# Patient Record
Sex: Female | Born: 1950 | Race: White | Hispanic: No | Marital: Married | Smoking: Former smoker
Health system: Southern US, Community
[De-identification: ages and names within clinical notes are randomized; demographics above are authoritative.]

## PROBLEM LIST (undated history)

## (undated) DIAGNOSIS — M549 Dorsalgia, unspecified: Secondary | ICD-10-CM

## (undated) DIAGNOSIS — R7981 Abnormal blood-gas level: Secondary | ICD-10-CM

## (undated) DIAGNOSIS — E039 Hypothyroidism, unspecified: Secondary | ICD-10-CM

## (undated) DIAGNOSIS — R600 Localized edema: Secondary | ICD-10-CM

## (undated) DIAGNOSIS — K1370 Unspecified lesions of oral mucosa: Secondary | ICD-10-CM

## (undated) DIAGNOSIS — T7840XA Allergy, unspecified, initial encounter: Secondary | ICD-10-CM

## (undated) DIAGNOSIS — M25569 Pain in unspecified knee: Secondary | ICD-10-CM

## (undated) DIAGNOSIS — E662 Morbid (severe) obesity with alveolar hypoventilation: Secondary | ICD-10-CM

## (undated) DIAGNOSIS — M545 Low back pain, unspecified: Secondary | ICD-10-CM

## (undated) DIAGNOSIS — F329 Major depressive disorder, single episode, unspecified: Secondary | ICD-10-CM

## (undated) DIAGNOSIS — I10 Essential (primary) hypertension: Secondary | ICD-10-CM

## (undated) DIAGNOSIS — J449 Chronic obstructive pulmonary disease, unspecified: Secondary | ICD-10-CM

## (undated) DIAGNOSIS — R0683 Snoring: Secondary | ICD-10-CM

## (undated) DIAGNOSIS — F32A Depression, unspecified: Secondary | ICD-10-CM

## (undated) DIAGNOSIS — M542 Cervicalgia: Secondary | ICD-10-CM

## (undated) DIAGNOSIS — R0602 Shortness of breath: Secondary | ICD-10-CM

## (undated) DIAGNOSIS — M7989 Other specified soft tissue disorders: Secondary | ICD-10-CM

## (undated) DIAGNOSIS — R053 Chronic cough: Secondary | ICD-10-CM

## (undated) DIAGNOSIS — R05 Cough: Secondary | ICD-10-CM

## (undated) DIAGNOSIS — K219 Gastro-esophageal reflux disease without esophagitis: Secondary | ICD-10-CM

## (undated) DIAGNOSIS — R011 Cardiac murmur, unspecified: Secondary | ICD-10-CM

## (undated) DIAGNOSIS — M25519 Pain in unspecified shoulder: Secondary | ICD-10-CM

## (undated) DIAGNOSIS — I509 Heart failure, unspecified: Secondary | ICD-10-CM

## (undated) DIAGNOSIS — D649 Anemia, unspecified: Secondary | ICD-10-CM

## (undated) DIAGNOSIS — E878 Other disorders of electrolyte and fluid balance, not elsewhere classified: Secondary | ICD-10-CM

## (undated) DIAGNOSIS — R42 Dizziness and giddiness: Secondary | ICD-10-CM

## (undated) HISTORY — DX: Chronic cough: R05.3

## (undated) HISTORY — DX: Morbid (severe) obesity with alveolar hypoventilation: E66.2

## (undated) HISTORY — DX: Dorsalgia, unspecified: M54.9

## (undated) HISTORY — DX: Depression, unspecified: F32.A

## (undated) HISTORY — DX: Dizziness and giddiness: R42

## (undated) HISTORY — DX: Heart failure, unspecified: I50.9

## (undated) HISTORY — DX: Pain in unspecified shoulder: M25.519

## (undated) HISTORY — PX: OTHER SURGICAL HISTORY: SHX169

## (undated) HISTORY — DX: Unspecified lesions of oral mucosa: K13.70

## (undated) HISTORY — PX: TONSILLECTOMY: SUR1361

## (undated) HISTORY — DX: Chronic obstructive pulmonary disease, unspecified: J44.9

## (undated) HISTORY — DX: Other disorders of electrolyte and fluid balance, not elsewhere classified: E87.8

## (undated) HISTORY — DX: Anemia, unspecified: D64.9

## (undated) HISTORY — DX: Cough: R05

## (undated) HISTORY — PX: CHOLECYSTECTOMY: SHX55

## (undated) HISTORY — DX: Other specified soft tissue disorders: M79.89

## (undated) HISTORY — DX: Pain in unspecified knee: M25.569

## (undated) HISTORY — PX: ECTOPIC PREGNANCY SURGERY: SHX613

## (undated) HISTORY — DX: Hypothyroidism, unspecified: E03.9

## (undated) HISTORY — DX: Morbid (severe) obesity due to excess calories: E66.01

## (undated) HISTORY — DX: Essential (primary) hypertension: I10

## (undated) HISTORY — PX: APPENDECTOMY: SHX54

## (undated) HISTORY — DX: Localized edema: R60.0

## (undated) HISTORY — DX: Low back pain: M54.5

## (undated) HISTORY — PX: TUBAL LIGATION: SHX77

## (undated) HISTORY — DX: Snoring: R06.83

## (undated) HISTORY — DX: Low back pain, unspecified: M54.50

## (undated) HISTORY — PX: MENISCUS REPAIR: SHX5179

## (undated) HISTORY — DX: Abnormal blood-gas level: R79.81

## (undated) HISTORY — DX: Allergy, unspecified, initial encounter: T78.40XA

## (undated) HISTORY — DX: Cervicalgia: M54.2

---
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
## 1997-09-07 ENCOUNTER — Emergency Department (HOSPITAL_COMMUNITY): Admission: EM | Admit: 1997-09-07 | Discharge: 1997-09-07 | Payer: Self-pay | Admitting: Emergency Medicine

## 1997-09-27 ENCOUNTER — Ambulatory Visit (HOSPITAL_COMMUNITY): Admission: RE | Admit: 1997-09-27 | Discharge: 1997-09-28 | Payer: Self-pay | Admitting: *Deleted

## 1997-11-02 ENCOUNTER — Encounter: Admission: RE | Admit: 1997-11-02 | Discharge: 1997-11-02 | Payer: Self-pay | Admitting: Family Medicine

## 1998-01-19 ENCOUNTER — Encounter: Admission: RE | Admit: 1998-01-19 | Discharge: 1998-01-19 | Payer: Self-pay | Admitting: Family Medicine

## 1998-02-17 ENCOUNTER — Encounter: Admission: RE | Admit: 1998-02-17 | Discharge: 1998-02-17 | Payer: Self-pay | Admitting: Family Medicine

## 1998-03-16 ENCOUNTER — Encounter: Admission: RE | Admit: 1998-03-16 | Discharge: 1998-03-16 | Payer: Self-pay | Admitting: Sports Medicine

## 1999-03-02 ENCOUNTER — Emergency Department (HOSPITAL_COMMUNITY): Admission: EM | Admit: 1999-03-02 | Discharge: 1999-03-02 | Payer: Self-pay | Admitting: Emergency Medicine

## 1999-03-02 ENCOUNTER — Encounter: Payer: Self-pay | Admitting: Emergency Medicine

## 2003-04-06 ENCOUNTER — Other Ambulatory Visit: Admission: RE | Admit: 2003-04-06 | Discharge: 2003-04-06 | Payer: Self-pay | Admitting: Family Medicine

## 2003-07-07 ENCOUNTER — Ambulatory Visit (HOSPITAL_COMMUNITY): Admission: RE | Admit: 2003-07-07 | Discharge: 2003-07-07 | Payer: Self-pay | Admitting: Family Medicine

## 2003-07-13 ENCOUNTER — Encounter (INDEPENDENT_AMBULATORY_CARE_PROVIDER_SITE_OTHER): Payer: Self-pay | Admitting: *Deleted

## 2003-07-13 ENCOUNTER — Ambulatory Visit (HOSPITAL_COMMUNITY): Admission: RE | Admit: 2003-07-13 | Discharge: 2003-07-13 | Payer: Self-pay | Admitting: Gastroenterology

## 2003-07-27 ENCOUNTER — Emergency Department (HOSPITAL_COMMUNITY): Admission: EM | Admit: 2003-07-27 | Discharge: 2003-07-27 | Payer: Self-pay | Admitting: Family Medicine

## 2004-08-24 ENCOUNTER — Encounter: Admission: RE | Admit: 2004-08-24 | Discharge: 2004-08-24 | Payer: Self-pay | Admitting: Family Medicine

## 2005-10-31 ENCOUNTER — Ambulatory Visit: Payer: Self-pay | Admitting: Family Medicine

## 2005-10-31 ENCOUNTER — Other Ambulatory Visit: Admission: RE | Admit: 2005-10-31 | Discharge: 2005-10-31 | Payer: Self-pay | Admitting: Family Medicine

## 2005-11-06 ENCOUNTER — Encounter: Admission: RE | Admit: 2005-11-06 | Discharge: 2005-11-06 | Payer: Self-pay | Admitting: Family Medicine

## 2006-04-14 ENCOUNTER — Ambulatory Visit (HOSPITAL_BASED_OUTPATIENT_CLINIC_OR_DEPARTMENT_OTHER): Admission: RE | Admit: 2006-04-14 | Discharge: 2006-04-14 | Payer: Self-pay | Admitting: Orthopedic Surgery

## 2006-07-12 ENCOUNTER — Emergency Department (HOSPITAL_COMMUNITY): Admission: EM | Admit: 2006-07-12 | Discharge: 2006-07-12 | Payer: Self-pay | Admitting: Family Medicine

## 2006-07-15 ENCOUNTER — Ambulatory Visit: Payer: Self-pay | Admitting: Family Medicine

## 2006-10-31 ENCOUNTER — Ambulatory Visit: Payer: Self-pay | Admitting: Family Medicine

## 2006-12-01 ENCOUNTER — Ambulatory Visit: Payer: Self-pay | Admitting: Family Medicine

## 2006-12-17 ENCOUNTER — Ambulatory Visit: Payer: Self-pay | Admitting: Family Medicine

## 2007-01-15 ENCOUNTER — Ambulatory Visit: Payer: Self-pay | Admitting: Family Medicine

## 2007-02-11 ENCOUNTER — Ambulatory Visit (HOSPITAL_BASED_OUTPATIENT_CLINIC_OR_DEPARTMENT_OTHER): Admission: RE | Admit: 2007-02-11 | Discharge: 2007-02-11 | Payer: Self-pay | Admitting: Family Medicine

## 2007-02-19 ENCOUNTER — Ambulatory Visit: Payer: Self-pay | Admitting: Pulmonary Disease

## 2007-02-27 ENCOUNTER — Ambulatory Visit: Payer: Self-pay | Admitting: Family Medicine

## 2007-04-01 ENCOUNTER — Emergency Department (HOSPITAL_COMMUNITY): Admission: EM | Admit: 2007-04-01 | Discharge: 2007-04-01 | Payer: Self-pay | Admitting: Emergency Medicine

## 2007-10-29 ENCOUNTER — Encounter: Admission: RE | Admit: 2007-10-29 | Discharge: 2007-10-29 | Payer: Self-pay | Admitting: Orthopedic Surgery

## 2008-02-10 ENCOUNTER — Encounter: Admission: RE | Admit: 2008-02-10 | Discharge: 2008-02-10 | Payer: Self-pay | Admitting: Internal Medicine

## 2008-03-23 ENCOUNTER — Ambulatory Visit (HOSPITAL_COMMUNITY): Admission: RE | Admit: 2008-03-23 | Discharge: 2008-03-23 | Payer: Self-pay | Admitting: Otolaryngology

## 2008-03-23 ENCOUNTER — Encounter (INDEPENDENT_AMBULATORY_CARE_PROVIDER_SITE_OTHER): Payer: Self-pay | Admitting: Otolaryngology

## 2009-02-15 ENCOUNTER — Ambulatory Visit (HOSPITAL_COMMUNITY): Admission: RE | Admit: 2009-02-15 | Discharge: 2009-02-15 | Payer: Self-pay | Admitting: Sports Medicine

## 2009-02-15 ENCOUNTER — Ambulatory Visit: Payer: Self-pay | Admitting: Vascular Surgery

## 2009-02-15 ENCOUNTER — Encounter (INDEPENDENT_AMBULATORY_CARE_PROVIDER_SITE_OTHER): Payer: Self-pay | Admitting: Sports Medicine

## 2009-10-01 ENCOUNTER — Emergency Department (HOSPITAL_COMMUNITY): Admission: EM | Admit: 2009-10-01 | Discharge: 2009-10-01 | Payer: Self-pay | Admitting: Emergency Medicine

## 2010-05-17 ENCOUNTER — Emergency Department (HOSPITAL_COMMUNITY): Payer: Self-pay

## 2010-05-17 ENCOUNTER — Emergency Department (HOSPITAL_COMMUNITY)
Admission: EM | Admit: 2010-05-17 | Discharge: 2010-05-17 | Disposition: A | Discharge status: Home / Self Care | Payer: Self-pay | Attending: Emergency Medicine | Admitting: Emergency Medicine

## 2010-05-17 DIAGNOSIS — R112 Nausea with vomiting, unspecified: Secondary | ICD-10-CM | POA: Insufficient documentation

## 2010-05-17 DIAGNOSIS — E669 Obesity, unspecified: Secondary | ICD-10-CM | POA: Insufficient documentation

## 2010-05-17 DIAGNOSIS — I10 Essential (primary) hypertension: Secondary | ICD-10-CM | POA: Insufficient documentation

## 2010-05-17 DIAGNOSIS — R51 Headache: Secondary | ICD-10-CM | POA: Insufficient documentation

## 2010-05-17 DIAGNOSIS — J4489 Other specified chronic obstructive pulmonary disease: Secondary | ICD-10-CM | POA: Insufficient documentation

## 2010-05-17 DIAGNOSIS — J449 Chronic obstructive pulmonary disease, unspecified: Secondary | ICD-10-CM | POA: Insufficient documentation

## 2010-05-17 DIAGNOSIS — M1990 Unspecified osteoarthritis, unspecified site: Secondary | ICD-10-CM | POA: Insufficient documentation

## 2010-05-17 LAB — DIFFERENTIAL
Basophils Absolute: 0.1 10*3/uL (ref 0.0–0.1)
Basophils Relative: 1 % (ref 0–1)
Eosinophils Absolute: 0.2 10*3/uL (ref 0.0–0.7)
Eosinophils Relative: 2 % (ref 0–5)
Lymphocytes Relative: 28 % (ref 12–46)
Lymphs Abs: 2.8 10*3/uL (ref 0.7–4.0)
Monocytes Absolute: 0.8 10*3/uL (ref 0.1–1.0)
Monocytes Relative: 8 % (ref 3–12)
Neutro Abs: 6.4 10*3/uL (ref 1.7–7.7)
Neutrophils Relative %: 63 % (ref 43–77)

## 2010-05-17 LAB — CBC
HCT: 46.7 % — ABNORMAL HIGH (ref 36.0–46.0)
Hemoglobin: 15.3 g/dL — ABNORMAL HIGH (ref 12.0–15.0)
MCH: 28.6 pg (ref 26.0–34.0)
MCHC: 32.8 g/dL (ref 30.0–36.0)
MCV: 87.3 fL (ref 78.0–100.0)
Platelets: 228 10*3/uL (ref 150–400)
RBC: 5.35 MIL/uL — ABNORMAL HIGH (ref 3.87–5.11)
RDW: 14.7 % (ref 11.5–15.5)
WBC: 10.3 10*3/uL (ref 4.0–10.5)

## 2010-05-17 LAB — POCT I-STAT, CHEM 8
BUN: 13 mg/dL (ref 6–23)
Calcium, Ion: 1.1 mmol/L — ABNORMAL LOW (ref 1.12–1.32)
Chloride: 101 mEq/L (ref 96–112)
Creatinine, Ser: 0.9 mg/dL (ref 0.4–1.2)
Glucose, Bld: 99 mg/dL (ref 70–99)
HCT: 48 % — ABNORMAL HIGH (ref 36.0–46.0)
Hemoglobin: 16.3 g/dL — ABNORMAL HIGH (ref 12.0–15.0)
Potassium: 4.2 mEq/L (ref 3.5–5.1)
Sodium: 138 mEq/L (ref 135–145)
TCO2: 29 mmol/L (ref 0–100)

## 2010-06-25 LAB — BASIC METABOLIC PANEL
BUN: 13 mg/dL (ref 6–23)
CO2: 24 mEq/L (ref 19–32)
Calcium: 9.2 mg/dL (ref 8.4–10.5)
Chloride: 108 mEq/L (ref 96–112)
Creatinine, Ser: 0.63 mg/dL (ref 0.4–1.2)
GFR calc Af Amer: >60 mL/min (ref >60)
GFR calc non Af Amer: >60 mL/min (ref >60)
Glucose, Bld: 88 mg/dL (ref 70–99)
Potassium: 4 mEq/L (ref 3.5–5.1)
Sodium: 141 mEq/L (ref 135–145)

## 2010-06-25 LAB — CBC
HCT: 45.4 % (ref 36.0–46.0)
Hemoglobin: 15.3 g/dL — ABNORMAL HIGH (ref 12.0–15.0)
MCHC: 33.7 g/dL (ref 30.0–36.0)
MCV: 87.1 fL (ref 78.0–100.0)
Platelets: 188 10*3/uL (ref 150–400)
RBC: 5.21 MIL/uL — ABNORMAL HIGH (ref 3.87–5.11)
RDW: 14.2 % (ref 11.5–15.5)
WBC: 10.4 10*3/uL (ref 4.0–10.5)

## 2010-07-24 NOTE — Procedures (Signed)
NAMECHRISANN, MELARAGNO NO.:  [id number]   MEDICAL RECORD NO.:  [id number]          PATIENT TYPE:  OUT   LOCATION:  SLEEP CENTER                 FACILITY:  [facility]   PHYSICIAN:  Barbaraann Share, MD,FCCPDATE OF BIRTH:  February 01, 1953   DATE OF STUDY:  02/11/2007                            NOCTURNAL POLYSOMNOGRAM   REFERRING PHYSICIAN:  Sharlot Gowda, M.D.   INDICATION FOR STUDY:  Hypersomnia with sleep apnea.   EPWORTH SLEEPINESS SCORE:  11   MEDICATIONS:   SLEEP ARCHITECTURE:  The patient had a total sleep time of 320 minutes  with decreased slow wave sleep as well as REM.  Sleep onset latency is  prolonged at 58 minutes, and REM onset is normal.  Sleep efficiency was  decreased at 81%.   RESPIRATORY DATA:  The patient was found to have 23 obstructive  hypopneas and no apneas for an apnea/hypopnea index of 4 events per  hour.  The events occurred primarily during REM, and the REM  apnea/hypopnea index was 26 per hour.  Loud snoring was noted  throughout.   OXYGEN DATA:  There was O2 desaturation as low as 84% with the patient's  obstructive events.  These were very transitory in nature, and the  patient only spent 2 minutes the entire study less than 88%.   CARDIAC DATA:  No clinically significant cardiac arrhythmias were noted.   MOVEMENT-PARASOMNIA:  None.   IMPRESSIONS-RECOMMENDATIONS:  Small numbers of obstructive events which  do not meet the apnea/hypopnea index criteria for the obstructive sleep  apnea syndrome.  However, the patient had a very elevated REM  apnea/hypopnea index up to 26 events per hour.  Events that occur  primarily during REM can have a much greater impact than those occurring  in non-REM.  Consideration should be given to treating the patient's  sleep  disordered breathing if this is significantly impacting her quality of  life.  She should also be encouraged to work aggressively on her weight  loss.      Barbaraann Share,  MD,FCCP  Diplomate, American Board of Sleep  Medicine  Electronically Signed     KMC/MEDQ  D:  02/19/2007 16:56:54  T:  02/20/2007 11:44:37  Job:  [id number]

## 2010-07-24 NOTE — Op Note (Signed)
Morgan Odonnell, Morgan Odonnell            ACCOUNT NO.:  [id number]   MEDICAL RECORD NO.:  [id number]          PATIENT TYPE:  AMB   LOCATION:  SDS                          FACILITY:  [facility]   PHYSICIAN:  Antony Contras, MD     DATE OF BIRTH:  February 28, 1951   DATE OF PROCEDURE:  03/23/2008  DATE OF DISCHARGE:  03/23/2008                               OPERATIVE REPORT   PREOPERATIVE DIAGNOSES:  Tongue-base mass.   POSTOPERATIVE DIAGNOSIS:  Tongue-base mass.   PROCEDURE:  1. Direct laryngoscopy with biopsy.  2. Esophagoscopy.   SURGEON:  Excell Seltzer. Jenne Pane, MD   ANESTHESIA:  General endotracheal anesthesia.   COMPLICATIONS:  None.   INDICATIONS:  The patient is a 60 year old white female who was recently  evaluated due to some swallowing difficulty including an upper GI  radiologic study.  The study suggested an abnormality at the right  tongue base/vallecular region.  She is asymptomatic, but on fiberoptic  examination did have a red area on the right tongue base.  She presents  to the operating room for biopsy.   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  The right  tongue base did not appear grossly abnormal.  The biopsies were taken  from the right side as well as centrally.  The biopsy was also taken  from the right vallecular cyst.  The remainder of the pharynx and larynx  appeared normal as did the upper esophagus.   DESCRIPTION OF PROCEDURE:  The patient was identified in the holding  room and informed consent having been obtained including discussion of  risks, benefits, and alternatives, the patient was brought to the  operative suite and put on the operative table in supine position.  Anesthesia was induced.  The patient was intubated by the anesthesia  team without difficulty.  The patient was given intravenous steroids  during the case.  The eyes was taped closed and bed was turned 90  degrees from anesthesia.  A head wrap was placed around the patient's  head.  A damp gauze was placed over the upper gum and an anterior  commissure laryngoscope was then inserted and used to view the various  areas of the pharynx and larynx including the vallecula, tongue base,  endolarynx, piriform sinuses, and postcricoid region.  Findings noted  above.  The laryngoscope was then removed and a cervical esophagoscope  was then inserted and it was used to pass down the cervical esophagus  keeping the lumen in view.  It was then carefully backed out examining  the upper esophagus.  Findings are noted above.  The anterior commissure  laryngoscope was reinserted and used to examine the tongue base  carefully.  Biopsies were taken from the right vallecula, right tongue  base, and central tongue base using cup forceps.  After this was  completed, the throat was suctioned and laryngoscope and gauze removed.  The patient was then returned to Anesthesia for wake up and was  extubated and returned to recovery room in stable condition.  Antony Contras, MD  Electronically Signed     DDB/MEDQ  D:  03/23/2008  T:  03/24/2008  Job:  [id number]

## 2010-07-27 NOTE — Op Note (Signed)
NAME:  ALAZE, GARVERICK                      ACCOUNT NO.:  [id number]   MEDICAL RECORD NO.:  [id number]                   PATIENT TYPE:  AMB   LOCATION:  ENDO                                 FACILITY:  [facility]   PHYSICIAN:  Anselmo Rod, M.D.               DATE OF BIRTH:  1950-03-15   DATE OF PROCEDURE:  07/13/2003  DATE OF DISCHARGE:                                 OPERATIVE REPORT   PROCEDURE PERFORMED:  Colonoscopy with snare polypectomy times one and cold  biopsies times six.   ENDOSCOPIST:  Charna Elizabeth, M.D.   INSTRUMENT USED:  Olympus video colonoscope.   INDICATIONS FOR PROCEDURE:  The patient is a 60 year old female with history  of bright red blood per rectum and family history of colon cancer undergoing  screening colonoscopy.  Rule out colonic polyps, masses, hemorrhoids, etc.   PREPROCEDURE PREPARATION:  Informed consent was procured from the patient.  The patient was fasted for eight hours prior to the procedure and prepped  with a bottle of magnesium citrate and a gallon of GoLYTELY the night prior  to the procedure.   PREPROCEDURE PHYSICAL:  The patient had stable vital signs.  Neck supple.  Chest clear to auscultation.  S1 and S2 regular.  Abdomen soft with normal  bowel sounds.   DESCRIPTION OF PROCEDURE:  The patient was placed in left lateral decubitus  position and sedated with 70 mg of Demerol and 9 mg of Versed intravenously  in slow incremental doses.  Once the patient was adequately sedated and  maintained on low flow oxygen and continuous cardiac monitoring, the Olympus  video colonoscope was advanced from the rectum to the cecum.  There was some  residual stool in the colon and multiple washes were done.  The appendicular  orifice and ileocecal valve were clearly visualized and photographed. Two  small sessile polyps were biopsied from the left colon.  Another one was  snared from the left colon at 40 cm.  There was evidence of scattered  sigmoid  diverticulosis.  Small internal hemorrhoids were seen on  retroflexion in the rectum.   IMPRESSION:  1. Small nonbleeding internal hemorrhoids.  2. Sigmoid diverticulosis.  3. Three polyps removed from left colon, two by cold biopsy times six and     one by snare polypectomy.   RECOMMENDATIONS:  1. Await pathology results.  2. Avoid all nonsteroidals including aspirin for the next four weeks.  3. Outpatient followup in the next two weeks for further recommendations.                                               Anselmo Rod, M.D.    JNM/MEDQ  D:  07/13/2003  T:  07/13/2003  Job:  [id number]   cc:   Sharlot Gowda, M.D.  [address]  [place], [place] [address]  Fax: [phone]

## 2010-07-27 NOTE — Op Note (Signed)
[name], [name]            ACCOUNT NO.:  [id number]   MEDICAL RECORD NO.:  [id number]          PATIENT TYPE:  AMB   LOCATION:  DSC                          FACILITY:  [facility]   PHYSICIAN:  Harvie Junior, M.D.   DATE OF BIRTH:  08-Sep-1950   DATE OF PROCEDURE:  04/14/2006  DATE OF DISCHARGE:                               OPERATIVE REPORT   PREOPERATIVE DIAGNOSIS:  Medial meniscal tear.   POSTOPERATIVE DIAGNOSIS:  1. Medial meniscal tear with corresponding chondromalacia in the      medial compartment.  2. 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  3. Medial shelf plica.   PRINCIPAL PROCEDURE:  1. Arthroscopic partial posterior compartment medial meniscectomy with      corresponding debridement in the medial compartment.  2. Debridement of chondromalacia in the patellofemoral joint, both on      the lateral patellar facet and central trochlea.  3. Debridement of medial shelf plica.   SURGEON:  Harvie Junior, M.D.   ASSISTANT:  Marshia Ly, P.A.   ANESTHESIA:  General.   BRIEF HISTORY:  Morgan Odonnell is a 60 year old female with a long history  of having had a twisting injury to her knee.  She ultimately suffered a  posterior compartment medial meniscal tear.  We treated her  conservatively for a long period of time with antiinflammatory  medication, activity modification, injection therapy.  All this failed.  The patient continues to complain of pain on the medial side.  She is  ultimately taken to the operating room for operative medial arthroscopy  procedure.   PROCEDURE:  The patient was taken to the operating room.  After adequate  anesthesia was obtained using general anesthetic, the patient was placed  on the operating table.  The right leg was prepped and draped in the  usual sterile fashion.  Following this routine arthroscopic examination  of the knee revealed there was an obvious posterior horn medial  meniscal  tear.  This was at the medial meniscal root and curved around  posteriorly to the mid-body of the medial meniscus.  This area was  identified and debrided back to a smooth, stable rim.  There was some  grade 2 and grade 3 changes on the medial femoral condyle and these were  debrided back to a smooth and stable rim as well.   Attention was turned up to the patellofemoral joint where the lateral  patellar facet had some grade 2 changes.  The central trochlea had some  grade 2 changes.  The lateral compartment had no significant chondral  injury and the meniscus looked normal.  ACL looked normal.  There was a  large medial shelf plica that was blocking entrance into the medial  compartment, which was debrided and also had some fraying on the medial  femoral condyle where this was blocking entrance into the medial  compartment.   At this point the knee was copiously and thoroughly irrigated with 6 L  normal saline irrigation and suctioned dry.  The lower __________  was  closed with a  bandage.  A sterile compression dressing was applied.  The  patient was taken to recovery where she was noted to be in satisfactory  condition.   ESTIMATED BLOOD LOSS:  Estimated blood loss for the procedure was none.      Harvie Junior, M.D.  Electronically Signed     JLG/MEDQ  D:  04/14/2006  T:  04/15/2006  Job:  [id number]

## 2010-11-30 LAB — POCT URINALYSIS DIP (DEVICE)
Bilirubin Urine: NEGATIVE
Glucose, UA: NEGATIVE
Hgb urine dipstick: NEGATIVE
Ketones, ur: NEGATIVE
Nitrite: NEGATIVE
Operator id: 239701
Protein, ur: NEGATIVE
Specific Gravity, Urine: 1.015
Urobilinogen, UA: 0.2
pH: 7.5

## 2012-02-12 ENCOUNTER — Encounter (INDEPENDENT_AMBULATORY_CARE_PROVIDER_SITE_OTHER): Payer: Self-pay | Admitting: Surgery

## 2012-02-12 ENCOUNTER — Ambulatory Visit (INDEPENDENT_AMBULATORY_CARE_PROVIDER_SITE_OTHER): Payer: Medicaid Other | Admitting: Surgery

## 2012-02-12 VITALS — BP 130/72 | HR 86 | Temp 97.9°F | Ht 62.0 in | Wt 329.4 lb

## 2012-02-12 DIAGNOSIS — N61 Mastitis without abscess: Secondary | ICD-10-CM

## 2012-02-12 DIAGNOSIS — N611 Abscess of the breast and nipple: Secondary | ICD-10-CM | POA: Insufficient documentation

## 2012-02-12 NOTE — Progress Notes (Signed)
Subjective:     Patient ID: Morgan Odonnell, female   DOB: Dec 03, 1950, 61 y.o.   MRN: [id number]  HPI She is referred by Dr. Jarold Motto for evaluation of a right breast abscess. She started noticing a tender area on Thanksgiving. She just started antibiotics yesterday. She has had some spontaneous drainage. She has not had a mammogram in approximately 5 years per her report  Review of Systems     Objective:   Physical Exam On exam, there is a lateral right breast abscess. There is already an open wound. I prepped the area Betadine, anesthetized with lidocaine, and open up the wound further. I found an abscess cavity with no further purulence. I packed it with gauze    Assessment:     Right breast abscess    Plan:     Wound care instructions were given. She will continue the antibiotics. I will see her back in 2 weeks

## 2012-02-28 ENCOUNTER — Encounter (INDEPENDENT_AMBULATORY_CARE_PROVIDER_SITE_OTHER): Payer: Medicaid Other | Admitting: Surgery

## 2012-05-22 DIAGNOSIS — G4710 Hypersomnia, unspecified: Secondary | ICD-10-CM | POA: Diagnosis not present

## 2012-05-22 DIAGNOSIS — R0609 Other forms of dyspnea: Secondary | ICD-10-CM | POA: Diagnosis not present

## 2012-05-26 DIAGNOSIS — F331 Major depressive disorder, recurrent, moderate: Secondary | ICD-10-CM | POA: Diagnosis not present

## 2012-06-05 ENCOUNTER — Encounter: Payer: Self-pay | Admitting: Neurology

## 2012-06-05 ENCOUNTER — Telehealth: Payer: Self-pay | Admitting: Pulmonary Disease

## 2012-06-05 NOTE — Telephone Encounter (Signed)
Pt scheduled for Monday 06-08-12 in [facility] office. Malachi Bonds at Dr. Silvano Rusk office is aware. Carron Curie, CMA

## 2012-06-08 ENCOUNTER — Encounter: Payer: Self-pay | Admitting: Critical Care Medicine

## 2012-06-08 ENCOUNTER — Ambulatory Visit (HOSPITAL_BASED_OUTPATIENT_CLINIC_OR_DEPARTMENT_OTHER)
Admission: RE | Admit: 2012-06-08 | Discharge: 2012-06-08 | Disposition: A | Discharge status: Home / Self Care | Payer: Medicare Other | Source: Ambulatory Visit | Attending: Critical Care Medicine | Admitting: Critical Care Medicine

## 2012-06-08 ENCOUNTER — Ambulatory Visit (INDEPENDENT_AMBULATORY_CARE_PROVIDER_SITE_OTHER): Payer: Medicare Other | Admitting: Critical Care Medicine

## 2012-06-08 VITALS — BP 102/68 | HR 62 | Temp 97.7°F | Ht 62.0 in | Wt 326.5 lb

## 2012-06-08 DIAGNOSIS — J9610 Chronic respiratory failure, unspecified whether with hypoxia or hypercapnia: Secondary | ICD-10-CM

## 2012-06-08 DIAGNOSIS — M545 Low back pain, unspecified: Secondary | ICD-10-CM | POA: Insufficient documentation

## 2012-06-08 DIAGNOSIS — J9612 Chronic respiratory failure with hypercapnia: Secondary | ICD-10-CM

## 2012-06-08 DIAGNOSIS — R609 Edema, unspecified: Secondary | ICD-10-CM | POA: Diagnosis not present

## 2012-06-08 DIAGNOSIS — R600 Localized edema: Secondary | ICD-10-CM

## 2012-06-08 DIAGNOSIS — R05 Cough: Secondary | ICD-10-CM | POA: Insufficient documentation

## 2012-06-08 DIAGNOSIS — R0683 Snoring: Secondary | ICD-10-CM | POA: Insufficient documentation

## 2012-06-08 DIAGNOSIS — Z0189 Encounter for other specified special examinations: Secondary | ICD-10-CM | POA: Diagnosis not present

## 2012-06-08 DIAGNOSIS — I10 Essential (primary) hypertension: Secondary | ICD-10-CM | POA: Insufficient documentation

## 2012-06-08 DIAGNOSIS — J441 Chronic obstructive pulmonary disease with (acute) exacerbation: Secondary | ICD-10-CM | POA: Insufficient documentation

## 2012-06-08 DIAGNOSIS — J449 Chronic obstructive pulmonary disease, unspecified: Secondary | ICD-10-CM | POA: Diagnosis not present

## 2012-06-08 DIAGNOSIS — J9621 Acute and chronic respiratory failure with hypoxia: Secondary | ICD-10-CM | POA: Insufficient documentation

## 2012-06-08 LAB — POCT I-STAT 3, ART BLOOD GAS (G3+)
Acid-Base Excess: 2 mmol/L (ref 0.0–2.0)
Bicarbonate: 28.3 mEq/L — ABNORMAL HIGH (ref 20.0–24.0)
O2 Saturation: 95 % (ref 90.0–100.0)
Patient temperature: 98
TCO2: 30 mmol/L (ref 0–100)
pCO2 arterial: 49 mmHg — ABNORMAL HIGH (ref 35.0–45.0)
pH, Arterial: 7.367 (ref 7.350–7.400)
pO2, Arterial: 76 mmHg — ABNORMAL LOW (ref 80.0–100.0)

## 2012-06-08 NOTE — Patient Instructions (Signed)
Stay on symbicort and spiriva We will obtain ABG today  We will schedule a pulmonary function study and based upon this may obtain a nighttime bipap device with or with out oxygen Return 1 month at [facility] office but we will call sooner with results

## 2012-06-08 NOTE — Progress Notes (Signed)
Subjective:    Patient ID: Morgan Odonnell, female    DOB: 26-Sep-1950, 62 y.o.   MRN: [id number]  Shortness of Breath This is a chronic problem. The current episode started more than 1 year ago. The problem occurs constantly (unable to walk long distances). The problem has been gradually worsening. Associated symptoms include headaches, leg swelling, neck pain, PND, a rash, rhinorrhea, vomiting and wheezing. Pertinent negatives include no abdominal pain, chest pain, claudication, coryza, ear pain, fever, hemoptysis, leg pain, orthopnea or sore throat. The symptoms are aggravated by any activity, lying flat, exercise and weather changes. Risk factors include smoking (quit smoking one year ago). She has tried beta agonist inhalers and ipratropium inhalers for the symptoms. The treatment provided moderate relief. Her past medical history is significant for asthma, COPD and pneumonia. There is no history of allergies, aspirin allergies, bronchiolitis, CAD, chronic lung disease, DVT, a heart failure, PE or a recent surgery.   62 y.o.WF.  Pt just had sleep study.   Pt with hx of issues for 32yrs. Pt notes wheezing, dyspnea, fatigue.  No real cough.  No real chest pain.  Notes edema in legs.  Notes no heartburn or indigestion.  No real mucus. Notes PNA  X 8 . Goes to be 1030, gets up 7a.  Awakens drowsy and fatigued.   Past Medical History  Diagnosis Date  . Oral lesion   . Leg edema     chronic, bilateral  . Morbid obesity   . Chronic cough   . COPD (chronic obstructive pulmonary disease)   . Hypertension   . Low back pain   . Snoring      Family History  Problem Relation Age of Onset  . Cancer Mother     ovarian  . Cancer Brother     lymphomia  . Cancer Maternal Aunt     ovarian  . Lung disease Mother   . Asthma Daughter   . Heart failure Mother      History   Social History  . Marital Status: Married    Spouse Name: N/A    Number of Children: N/A  . Years of Education: N/A    Occupational History  . Not on file.   Social History Main Topics  . Smoking status: Former Smoker -- 1.50 packs/day for 45 years    Types: Cigarettes    Quit date: 02/12/2011  . Smokeless tobacco: Not on file  . Alcohol Use: No  . Drug Use: No  . Sexually Active: Not on file   Other Topics Concern  . Not on file   Social History Narrative  . No narrative on file     Allergies  Allergen Reactions  . Gabapentin     "slept for 2 days"     Outpatient Prescriptions Prior to Visit  Medication Sig Dispense Refill  . albuterol (PROVENTIL HFA;VENTOLIN HFA) 108 (90 BASE) MCG/ACT inhaler Inhale 2 puffs into the lungs every 6 (six) hours as needed.      . budesonide-formoterol (SYMBICORT) 160-4.5 MCG/ACT inhaler Inhale 2 puffs into the lungs 2 (two) times daily.      Marland Kitchen buPROPion (ZYBAN) 150 MG 12 hr tablet Take 150 mg by mouth daily.       . diclofenac sodium (VOLTAREN) 1 % GEL Apply topically.      Marland Kitchen HYDROcodone-acetaminophen (NORCO/VICODIN) 5-325 MG per tablet Take 1 tablet by mouth 3 (three) times daily.       Marland Kitchen loratadine (CLARITIN) 10 MG tablet  Take 10 mg by mouth daily.      . meloxicam (MOBIC) 15 MG tablet Take 15 mg by mouth daily.      Marland Kitchen METOPROLOL TARTRATE PO Take 25 mg by mouth 2 (two) times daily.      . MOMETASONE FUROATE EX Apply topically as needed.       . polyethylene glycol (MIRALAX / GLYCOLAX) packet Take 17 g by mouth daily.      Marland Kitchen tiotropium (SPIRIVA) 18 MCG inhalation capsule Place 18 mcg into inhaler and inhale daily.      . verapamil (CALAN) 120 MG tablet Take 120 mg by mouth 2 (two) times daily.      Marland Kitchen albuterol (PROVENTIL) (2.5 MG/3ML) 0.083% nebulizer solution Take 2.5 mg by nebulization as needed.      . doxycycline (DORYX) 100 MG EC tablet Take 100 mg by mouth 2 (two) times daily.      Marland Kitchen HYDROcodone-acetaminophen (NORCO/VICODIN) 5-325 MG per tablet Take 1 tablet by mouth every 6 (six) hours as needed.      . meloxicam (MOBIC) 7.5 MG tablet Take 7.5 mg  by mouth daily.       No facility-administered medications prior to visit.      Review of Systems  Constitutional: Positive for appetite change, fatigue and unexpected weight change. Negative for fever, chills, diaphoresis and activity change.  HENT: Positive for hearing loss, nosebleeds, congestion, rhinorrhea, sneezing, neck pain, neck stiffness, sinus pressure, tinnitus and ear discharge. Negative for ear pain, sore throat, facial swelling, mouth sores, trouble swallowing, dental problem, voice change and postnasal drip.   Eyes: Positive for itching and visual disturbance. Negative for photophobia and discharge.  Respiratory: Positive for cough, choking, chest tightness, shortness of breath and wheezing. Negative for apnea, hemoptysis and stridor.   Cardiovascular: Positive for leg swelling and PND. Negative for chest pain, palpitations, orthopnea and claudication.  Gastrointestinal: Positive for nausea and vomiting. Negative for abdominal pain, constipation, blood in stool and abdominal distention.  Genitourinary: Positive for frequency and flank pain. Negative for dysuria, urgency, hematuria, decreased urine volume and difficulty urinating.  Musculoskeletal: Positive for myalgias, back pain, joint swelling, arthralgias and gait problem.  Skin: Positive for rash. Negative for color change and pallor.  Neurological: Positive for tremors, weakness, numbness and headaches. Negative for dizziness, seizures, syncope, speech difficulty and light-headedness.  Hematological: Negative for adenopathy. Does not bruise/bleed easily.  Psychiatric/Behavioral: Positive for sleep disturbance. Negative for confusion and agitation. The patient is nervous/anxious.        Objective:   Physical Exam Filed Vitals:   06/08/12 1000  BP: 102/68  Pulse: 62  Temp: 97.7 F (36.5 C)  TempSrc: Oral  Height: 5\' 2"  (1.575 m)  Weight: 326 lb 8 oz (148.099 kg)  SpO2: 94%    ZOX:WRUEA, in no distress,  normal  affect  ENT: No lesions,  mouth clear,  oropharynx clear, no postnasal drip  Neck: No JVD, no TMG, no carotid bruits  Lungs: No use of accessory muscles, no dullness to percussion, distant breath sounds  Cardiovascular: RRR, heart sounds normal, no murmur or gallops, ++++ peripheral edema  Abdomen: soft and NT, no HSM,  BS normal  Musculoskeletal: No deformities, no cyanosis or clubbing  Neuro: alert, non focal  Skin: Warm, no lesions or rashes   Recent Labs Lab 06/08/12 1118  PHART 7.367  PCO2ART 49.0*  PO2ART 76.0*  HCO3 28.3*  TCO2 30.0  O2SAT 95.0  Note above on RA  CXR: No acute  disease is seen  Recent sleep study was reviewed and showed very low AHI of 3 significant desaturation during sleep and no evidence of obstructive sleep apnea    Assessment & Plan:   COPD (chronic obstructive pulmonary disease) Chronic obstructive lung disease cannot give exact stick real severity has no pulmonary functions are available for review Mild hypercarbia but does not yet reach the level needed to obtain bilevel support at night unless pulmonary functions will support this Significant nocturnal hypoxemia which may benefit from nasal cannula oxygen but no evidence of obstructive sleep apnea on sleep study Plan Obtain full set of pulmonary function studies Maintain current inhaled medications as prescribed Based upon pulmonary function studies further recommendations will follow  Chronic respiratory failure with hypercapnia Chronic respiratory failure with mild hypercapnia with associated obesity and mild hypoventilation No evidence of obstructive sleep apnea on recent sleep study Plan Obtain full set of pulmonary function studies and based upon this to determine whether or nocturnal bilevel support is appropriate versus simply nasal cannula oxygen   Updated Medication List Outpatient Encounter Prescriptions as of 06/08/2012  Medication Sig Dispense Refill  . albuterol  (PROVENTIL HFA;VENTOLIN HFA) 108 (90 BASE) MCG/ACT inhaler Inhale 2 puffs into the lungs every 6 (six) hours as needed.      Marland Kitchen aspirin 325 MG tablet Take 325 mg by mouth as needed for pain.      . budesonide-formoterol (SYMBICORT) 160-4.5 MCG/ACT inhaler Inhale 2 puffs into the lungs 2 (two) times daily.      Marland Kitchen buPROPion (ZYBAN) 150 MG 12 hr tablet Take 150 mg by mouth daily.       . citalopram (CELEXA) 20 MG tablet Take 20 mg by mouth daily.      . diclofenac sodium (VOLTAREN) 1 % GEL Apply topically.      Marland Kitchen HYDROcodone-acetaminophen (NORCO/VICODIN) 5-325 MG per tablet Take 1 tablet by mouth 3 (three) times daily.       Marland Kitchen ibuprofen (ADVIL,MOTRIN) 200 MG tablet Take 200 mg by mouth every 6 (six) hours as needed for pain.      Marland Kitchen loratadine (CLARITIN) 10 MG tablet Take 10 mg by mouth daily.      . meloxicam (MOBIC) 15 MG tablet Take 15 mg by mouth daily.      Marland Kitchen METOPROLOL TARTRATE PO Take 25 mg by mouth 2 (two) times daily.      . MOMETASONE FUROATE EX Apply topically as needed.       . polyethylene glycol (MIRALAX / GLYCOLAX) packet Take 17 g by mouth daily.      Marland Kitchen tiotropium (SPIRIVA) 18 MCG inhalation capsule Place 18 mcg into inhaler and inhale daily.      . verapamil (CALAN) 120 MG tablet Take 120 mg by mouth 2 (two) times daily.      . [DISCONTINUED] albuterol (PROVENTIL) (2.5 MG/3ML) 0.083% nebulizer solution Take 2.5 mg by nebulization as needed.      . [DISCONTINUED] doxycycline (DORYX) 100 MG EC tablet Take 100 mg by mouth 2 (two) times daily.      . [DISCONTINUED] HYDROcodone-acetaminophen (NORCO/VICODIN) 5-325 MG per tablet Take 1 tablet by mouth every 6 (six) hours as needed.      . [DISCONTINUED] meloxicam (MOBIC) 7.5 MG tablet Take 7.5 mg by mouth daily.       No facility-administered encounter medications on file as of 06/08/2012.

## 2012-06-08 NOTE — Assessment & Plan Note (Signed)
Chronic respiratory failure with mild hypercapnia with associated obesity and mild hypoventilation No evidence of obstructive sleep apnea on recent sleep study Plan Obtain full set of pulmonary function studies and based upon this to determine whether or nocturnal bilevel support is appropriate versus simply nasal cannula oxygen

## 2012-06-08 NOTE — Progress Notes (Signed)
Quick Note:  lmomtcb for pt ______ 

## 2012-06-08 NOTE — Assessment & Plan Note (Signed)
Chronic obstructive lung disease cannot give exact stick real severity has no pulmonary functions are available for review Mild hypercarbia but does not yet reach the level needed to obtain bilevel support at night unless pulmonary functions will support this Significant nocturnal hypoxemia which may benefit from nasal cannula oxygen but no evidence of obstructive sleep apnea on sleep study Plan Obtain full set of pulmonary function studies Maintain current inhaled medications as prescribed Based upon pulmonary function studies further recommendations will follow

## 2012-06-09 ENCOUNTER — Ambulatory Visit (INDEPENDENT_AMBULATORY_CARE_PROVIDER_SITE_OTHER): Payer: Medicare Other | Admitting: Critical Care Medicine

## 2012-06-09 DIAGNOSIS — J449 Chronic obstructive pulmonary disease, unspecified: Secondary | ICD-10-CM

## 2012-06-09 DIAGNOSIS — J9612 Chronic respiratory failure with hypercapnia: Secondary | ICD-10-CM

## 2012-06-09 DIAGNOSIS — J9610 Chronic respiratory failure, unspecified whether with hypoxia or hypercapnia: Secondary | ICD-10-CM

## 2012-06-09 LAB — PULMONARY FUNCTION TEST

## 2012-06-09 NOTE — Progress Notes (Signed)
PFT done today. 

## 2012-06-09 NOTE — Progress Notes (Signed)
Quick Note:  lmomtcb for pt ______ 

## 2012-06-12 NOTE — Progress Notes (Signed)
Quick Note:  Please review complete polysomnography report in the media section. Her PCP is not entered . ______

## 2012-06-15 ENCOUNTER — Telehealth: Payer: Self-pay | Admitting: Critical Care Medicine

## 2012-06-15 DIAGNOSIS — J449 Chronic obstructive pulmonary disease, unspecified: Secondary | ICD-10-CM

## 2012-06-15 DIAGNOSIS — J9612 Chronic respiratory failure with hypercapnia: Secondary | ICD-10-CM

## 2012-06-15 NOTE — Telephone Encounter (Signed)
Call pt and tell her PFTs show  Moderate restriction and obstruction with improvement with bronchodilators Her PFTs do support need for nocturnal bipap I will send order to DME company

## 2012-06-15 NOTE — Telephone Encounter (Signed)
lmomtcb to inform pt of below PFT results and recs per PW

## 2012-06-15 NOTE — Telephone Encounter (Signed)
This oprder was sent to [facility] [place]

## 2012-06-16 ENCOUNTER — Encounter: Payer: Self-pay | Admitting: Critical Care Medicine

## 2012-06-16 ENCOUNTER — Telehealth: Payer: Self-pay | Admitting: Critical Care Medicine

## 2012-06-16 DIAGNOSIS — J449 Chronic obstructive pulmonary disease, unspecified: Secondary | ICD-10-CM

## 2012-06-16 DIAGNOSIS — J9612 Chronic respiratory failure with hypercapnia: Secondary | ICD-10-CM

## 2012-06-16 NOTE — Telephone Encounter (Signed)
Called pt - went directly to VM. lmomtcb 

## 2012-06-16 NOTE — Telephone Encounter (Signed)
Using recent sleep study: order 2Liters of oxygen QHS for now

## 2012-06-16 NOTE — Telephone Encounter (Signed)
Spoke with pt and notified of results per Dr. Wright. Pt verbalized understanding and denied any questions.  

## 2012-06-16 NOTE — Telephone Encounter (Signed)
LMTCB

## 2012-06-16 NOTE — Telephone Encounter (Signed)
Spoke with Melissa She states that BIPAP is not covered with dx of COPD unless the pt is dependent on supplemental o2  Will forward to PW so he is aware

## 2012-06-17 NOTE — Telephone Encounter (Signed)
I spoke with Melissa and advised of recs. She states medicare willn ot cover oxygen at bedtime based off of sleep study, pt will need an ONO done. Please advise if it is ok to order ONO? Carron Curie, CMA

## 2012-06-17 NOTE — Telephone Encounter (Signed)
Spoke with Melissa.  Nielle Duford, [facility] RT Manager, is working on this. Melissa will contact me tomorrow to follow up on what Langley Ingalls has found out.

## 2012-06-17 NOTE — Telephone Encounter (Signed)
Hard to believe this, it has all the data needed Fax sleep study to [facility]  When i am in [place] , get these folks on the line with copy of sleep study in hand

## 2012-06-17 NOTE — Telephone Encounter (Signed)
Melissa returning call can be reached at [phone].Morgan Odonnell ° °

## 2012-06-17 NOTE — Telephone Encounter (Signed)
Order placed for 2lpm o2 qhs through the [facility] D[facility] o2 order. lmomtcb for Melissa to inform her of this. lmomtcb for pt to inform per of change.

## 2012-06-17 NOTE — Telephone Encounter (Signed)
Called spoke with patient, gave her status update as requested Per Crystal, will route back to her as requested.

## 2012-06-17 NOTE — Telephone Encounter (Signed)
Pt has called for a status update.  Morgan Odonnell'

## 2012-06-18 NOTE — Telephone Encounter (Signed)
Melissa from [facility] called back stating that she does need the ONO on oxygen and the ABG on Oxygen. The best number to reach her at is [phone]

## 2012-06-18 NOTE — Telephone Encounter (Signed)
Dr. Delford Field, pls advise if ok to order below tests.  Thank you.

## 2012-06-18 NOTE — Telephone Encounter (Signed)
Ok proceed.  

## 2012-06-18 NOTE — Telephone Encounter (Signed)
Spoke with Melissa with [facility]. Was advised per Ravan Schlemmer, RT manager, they can use the information from the Sleep Study to qualify pt for o2. Order has been placed for o2 -- Melissa aware of this and sees order in epic. Now, we may need to place order fo ABG on prescribed liter flow as well as ONO on prescribed liter flow per Medicare guidelines. Melissa will double check on this and get back with me.

## 2012-06-19 NOTE — Telephone Encounter (Signed)
Orders placed for ONO on 2 LPM and ABG on 2 lpm. ABG on 2 lpm scheduled with Marcelino Duster for June 26, 2012 at 9 am at [facility] (date per pt's request). Melissa with [facility] aware orders for both have been placed on 2 lpm, ABG scheduled for April 18, and will make sure ONO on 2 lpm get scheduled. I spoke with pt.  I explained below situation to her.  She was understanding and willing to proceed with tests.  She is aware of pending ABG on 2 lpm appt date, time, and location.  She is aware DME will be contacting her set up ONO on 2lpm.  She verbalized understanding of this, voiced no further questions or concerns at this time, and will call back if she doesn't hear from [facility] to set up ONO on o2 by the middle of next wk.

## 2012-06-22 ENCOUNTER — Encounter (HOSPITAL_COMMUNITY): Payer: Medicare Other

## 2012-06-24 ENCOUNTER — Encounter: Payer: Self-pay | Admitting: Critical Care Medicine

## 2012-06-26 ENCOUNTER — Encounter (HOSPITAL_COMMUNITY): Payer: Medicare Other

## 2012-06-30 ENCOUNTER — Ambulatory Visit (HOSPITAL_COMMUNITY)
Admission: RE | Admit: 2012-06-30 | Discharge: 2012-06-30 | Disposition: A | Discharge status: Home / Self Care | Payer: Medicare Other | Source: Ambulatory Visit | Attending: Critical Care Medicine | Admitting: Critical Care Medicine

## 2012-06-30 ENCOUNTER — Telehealth: Payer: Self-pay | Admitting: Critical Care Medicine

## 2012-06-30 DIAGNOSIS — J9612 Chronic respiratory failure with hypercapnia: Secondary | ICD-10-CM

## 2012-06-30 DIAGNOSIS — J449 Chronic obstructive pulmonary disease, unspecified: Secondary | ICD-10-CM | POA: Diagnosis not present

## 2012-06-30 DIAGNOSIS — J4489 Other specified chronic obstructive pulmonary disease: Secondary | ICD-10-CM | POA: Insufficient documentation

## 2012-06-30 LAB — BLOOD GAS, ARTERIAL
Acid-Base Excess: 3.7 mmol/L — ABNORMAL HIGH (ref 0.0–2.0)
Bicarbonate: 28.6 mEq/L — ABNORMAL HIGH (ref 20.0–24.0)
Drawn by: 24485
O2 Content: 2 L/min
O2 Saturation: 98.2 %
Patient temperature: 98.6
TCO2: 30.2 mmol/L (ref 0–100)
pCO2 arterial: 50.6 mmHg — ABNORMAL HIGH (ref 35.0–45.0)
pH, Arterial: 7.371 (ref 7.350–7.450)
pO2, Arterial: 87.5 mmHg (ref 80.0–100.0)

## 2012-06-30 NOTE — Telephone Encounter (Signed)
I spoke with Misty Stanley at resp therapy and she states they have taken care of this and nothing further needed. Carron Curie, CMA

## 2012-07-13 DIAGNOSIS — H00019 Hordeolum externum unspecified eye, unspecified eyelid: Secondary | ICD-10-CM | POA: Diagnosis not present

## 2012-07-14 DIAGNOSIS — H00019 Hordeolum externum unspecified eye, unspecified eyelid: Secondary | ICD-10-CM | POA: Diagnosis not present

## 2012-07-21 DIAGNOSIS — F331 Major depressive disorder, recurrent, moderate: Secondary | ICD-10-CM | POA: Diagnosis not present

## 2012-08-17 ENCOUNTER — Encounter: Payer: Self-pay | Admitting: Critical Care Medicine

## 2012-10-08 ENCOUNTER — Encounter: Payer: Self-pay | Admitting: Critical Care Medicine

## 2012-10-13 DIAGNOSIS — I10 Essential (primary) hypertension: Secondary | ICD-10-CM | POA: Diagnosis not present

## 2012-10-19 DIAGNOSIS — I10 Essential (primary) hypertension: Secondary | ICD-10-CM | POA: Diagnosis not present

## 2012-10-19 DIAGNOSIS — L089 Local infection of the skin and subcutaneous tissue, unspecified: Secondary | ICD-10-CM | POA: Diagnosis not present

## 2012-10-19 DIAGNOSIS — Z6841 Body Mass Index (BMI) 40.0 and over, adult: Secondary | ICD-10-CM | POA: Diagnosis not present

## 2012-10-19 DIAGNOSIS — J449 Chronic obstructive pulmonary disease, unspecified: Secondary | ICD-10-CM | POA: Diagnosis not present

## 2012-10-26 DIAGNOSIS — F331 Major depressive disorder, recurrent, moderate: Secondary | ICD-10-CM | POA: Diagnosis not present

## 2012-11-04 DIAGNOSIS — L089 Local infection of the skin and subcutaneous tissue, unspecified: Secondary | ICD-10-CM | POA: Diagnosis not present

## 2012-11-04 DIAGNOSIS — Z6841 Body Mass Index (BMI) 40.0 and over, adult: Secondary | ICD-10-CM | POA: Diagnosis not present

## 2012-11-04 DIAGNOSIS — E662 Morbid (severe) obesity with alveolar hypoventilation: Secondary | ICD-10-CM | POA: Diagnosis not present

## 2012-11-04 DIAGNOSIS — M545 Low back pain: Secondary | ICD-10-CM | POA: Diagnosis not present

## 2012-11-04 DIAGNOSIS — J449 Chronic obstructive pulmonary disease, unspecified: Secondary | ICD-10-CM | POA: Diagnosis not present

## 2012-11-19 ENCOUNTER — Telehealth: Payer: Self-pay | Admitting: Critical Care Medicine

## 2012-11-19 NOTE — Telephone Encounter (Signed)
Spoke with the pt She is c/o increased SOB x 2 days, esp when goes outside in the humid weather OV with TP for tomorrow was scheduled and ED sooner tonight if worsens

## 2012-11-20 ENCOUNTER — Telehealth: Payer: Self-pay | Admitting: Critical Care Medicine

## 2012-11-20 ENCOUNTER — Ambulatory Visit (INDEPENDENT_AMBULATORY_CARE_PROVIDER_SITE_OTHER): Payer: Medicare Other | Admitting: Adult Health

## 2012-11-20 ENCOUNTER — Ambulatory Visit (INDEPENDENT_AMBULATORY_CARE_PROVIDER_SITE_OTHER)
Admission: RE | Admit: 2012-11-20 | Discharge: 2012-11-20 | Disposition: A | Discharge status: Home / Self Care | Payer: Medicare Other | Source: Ambulatory Visit | Attending: Adult Health | Admitting: Adult Health

## 2012-11-20 ENCOUNTER — Encounter: Payer: Self-pay | Admitting: Adult Health

## 2012-11-20 VITALS — BP 150/90 | HR 74 | Temp 97.4°F | Ht 62.0 in | Wt 340.0 lb

## 2012-11-20 DIAGNOSIS — R0602 Shortness of breath: Secondary | ICD-10-CM | POA: Diagnosis not present

## 2012-11-20 DIAGNOSIS — J449 Chronic obstructive pulmonary disease, unspecified: Secondary | ICD-10-CM

## 2012-11-20 MED ORDER — AMOXICILLIN-POT CLAVULANATE 875-125 MG PO TABS: 1.0000 | ORAL_TABLET | Freq: Two times a day (BID) | ORAL | Status: AC

## 2012-11-20 MED ORDER — MOMETASONE FURO-FORMOTEROL FUM 200-5 MCG/ACT IN AERO: 2.0000 | INHALATION_SPRAY | Freq: Two times a day (BID) | RESPIRATORY_TRACT | Status: DC

## 2012-11-20 NOTE — Progress Notes (Signed)
  Subjective:    Patient ID: Morgan Odonnell, female    DOB: 20-Jan-1951, 62 y.o.   MRN: [id number]  HPI 62 yo with hx of COPD  PFTs 06/15/2012: FeV1 57%  FeV1/FVC 70%  Fef 27 75 25% TLC 66%  DLCO 52%    Hypercarbic RF on nocturnal O2 at 2l/m  Sleep study 05/2012 , no OSA, +desats .   11/20/2012 Acute OV  Complains of increased SOB, wheezing, chest tightness, cough with occasional white/yellow mucus production, head congestion, PND, LE edema x1 week.  denies f/c/s, n/v,  Taking Spiriva . Insurance will not cover symbicort  Was recommended for BIPAP but Insurance will not cover BIPAP . Now on O2 at 2l/m (since 06/2012 )  No hemoptysis , chest pain , fever, n/v/d.  Not using otc cold meds.     Review of Systems Constitutional:   No  weight loss, night sweats,  Fevers, chills,  +fatigue, or  lassitude.  HEENT:   No headaches,  Difficulty swallowing,  Tooth/dental problems, or  Sore throat,                No sneezing, itching, ear ache,  +nasal congestion, post nasal drip,   CV:  No chest pain,  Orthopnea, PND,  anasarca, dizziness, palpitations, syncope.   GI  No heartburn, indigestion, abdominal pain, nausea, vomiting, diarrhea, change in bowel habits, loss of appetite, bloody stools.   Resp:.  No chest wall deformity  Skin: no rash or lesions.  GU: no dysuria, change in color of urine, no urgency or frequency.  No flank pain, no hematuria   MS:  +chronic back pain.  Psych:  No change in mood or affect. No depression or anxiety.  No memory loss.         Objective:   Physical Exam  GEN: A/Ox3; pleasant , NAD, morbidly obese   HEENT:  [place]/AT,  EACs-clear, TMs-wnl, NOSE-clear, THROAT-clear, no lesions, no postnasal drip or exudate noted.   NECK:  Supple w/ fair ROM; no JVD; normal carotid impulses w/o bruits; no thyromegaly or nodules palpated; no lymphadenopathy.  RESP  Diminished BS in bases .no accessory muscle use, no dullness to percussion  CARD:  RRR, no m/r/g  ,  1-2 +peripheral edema, pulses intact, no cyanosis or clubbing.  GI:   Soft & nt; nml bowel sounds; no organomegaly or masses detected.  Musco: Warm bil, no deformities or joint swelling noted.   Neuro: alert, no focal deficits noted.    Skin: Warm, no lesions or rashes        Assessment & Plan:

## 2012-11-20 NOTE — Patient Instructions (Addendum)
Augmentin 875mg  Twice daily  For 7 days.  Mucinex DM Twice daily  As needed  Cough/congestion  I will call with xray results.  Low salt diet  Keep Lasix 40mg  daily  Continue on Oxygen 2 l/m At bedtime   Begin Dulera 2 puffs Twice daily   follow up Dr. Delford Field  In 2 weeks As needed   Please contact office for sooner follow up if symptoms do not improve or worsen or seek emergency care

## 2012-11-20 NOTE — Assessment & Plan Note (Signed)
Mild flare with bronchitis  Suspect underlying OHS -w/ hypercarbia  Insurance will not cover BIPAP  Check xray today   Plan  Augmentin 875mg  Twice daily  For 7 days.  Mucinex DM Twice daily  As needed  Cough/congestion  I will call with xray results.  Low salt diet  Keep Lasix 40mg  daily  Continue on Oxygen 2 l/m At bedtime   Begin Dulera 2 puffs Twice daily   follow up Dr. Delford Field  In 2 weeks As needed   Please contact office for sooner follow up if symptoms do not improve or worsen or seek emergency care

## 2012-11-24 NOTE — Telephone Encounter (Signed)
PW isn't going to be in the office in 2 wks.   I spoke with Jess.  Ok for pt to see TP for this 2 wk follow up. I called, spoke with pt.  Informed pt of above.   Pt is ok with this as she doesn't want to go to [place] office. We have scheduled pt to see TP on Friday, Sept 26 at 3 pm.  Pt aware. Pt also requesting cxr results from OV with TP:  Result Notes    Notes Recorded by Julio Sicks, NP on 11/23/2012 at 4:11 PM Chronic change w/ No acute findings  Cont w/ ov recs  Please contact office for sooner follow up if symptoms do not improve or worsen or seek emergency care   ------  I informed pt of these results.  She verbalized understanding, voiced no further questions or concerns at this time, and was very appreciative of our call.

## 2012-11-24 NOTE — Progress Notes (Signed)
Quick Note:  Pt aware of cxr results and recs per TP. She verbalized understanding. ______

## 2012-12-04 ENCOUNTER — Encounter: Payer: Self-pay | Admitting: Adult Health

## 2012-12-04 ENCOUNTER — Ambulatory Visit (INDEPENDENT_AMBULATORY_CARE_PROVIDER_SITE_OTHER): Payer: Medicare Other | Admitting: Adult Health

## 2012-12-04 VITALS — BP 128/74 | HR 67 | Temp 98.5°F | Ht 64.0 in | Wt 336.4 lb

## 2012-12-04 DIAGNOSIS — I509 Heart failure, unspecified: Secondary | ICD-10-CM

## 2012-12-04 DIAGNOSIS — J449 Chronic obstructive pulmonary disease, unspecified: Secondary | ICD-10-CM | POA: Diagnosis not present

## 2012-12-04 DIAGNOSIS — Z23 Encounter for immunization: Secondary | ICD-10-CM

## 2012-12-04 NOTE — Progress Notes (Signed)
  Subjective:    Patient ID: Morgan Odonnell, female    DOB: 1950/07/12, 62 y.o.   MRN: [id number]  HPI  62 yo with hx of COPD  PFTs 06/15/2012: FeV1 57%  FeV1/FVC 70%  Fef 27 75 25% TLC 66%  DLCO 52%    Hypercarbic RF on nocturnal O2 at 2l/m  Sleep study 05/2012 , no OSA, +desats .   11/20/12  Acute OV  Complains of increased SOB, wheezing, chest tightness, cough with occasional white/yellow mucus production, head congestion, PND, LE edema x1 week.  denies f/c/s, n/v,  Taking Spiriva . Insurance will not cover symbicort  Was recommended for BIPAP but Insurance will not cover BIPAP . Now on O2 at 2l/m (since 06/2012 )  No hemoptysis , chest pain , fever, n/v/d.  Not using otc cold meds.  >rx augmentin x 7 d and CXR w/ chronic changes   12/04/2012 Follow up   2 week follow up COPD - reports is much improved but still has some cough  Taking Lasix 40mg  daily but legs are still swollen  Lost rx for Dulera. Was changed last ov d/t insurance will not cover Symbicort.  Requests referral to Cardiology for leg swelling.  Says she has family hx of CHF . No echo done in past.  Remains on O2 at At bedtime  .  No fever, chest pain, orthopnea, or n/v.    Review of Systems  Constitutional:   No  weight loss, night sweats,  Fevers, chills,  +fatigue, or  lassitude.  HEENT:   No headaches,  Difficulty swallowing,  Tooth/dental problems, or  Sore throat,                No sneezing, itching, ear ache,  +nasal congestion, post nasal drip,   CV:  No chest pain,  Orthopnea, PND,  anasarca, dizziness, palpitations, syncope.   GI  No heartburn, indigestion, abdominal pain, nausea, vomiting, diarrhea, change in bowel habits, loss of appetite, bloody stools.   Resp:.  No chest wall deformity  Skin: no rash or lesions.  GU: no dysuria, change in color of urine, no urgency or frequency.  No flank pain, no hematuria   MS:  +chronic back pain.  Psych:  No change in mood or affect. No depression or  anxiety.  No memory loss.         Objective:   Physical Exam   GEN: A/Ox3; pleasant , NAD, morbidly obese   HEENT:  [place]/AT,  EACs-clear, TMs-wnl, NOSE-clear, THROAT-clear, no lesions, no postnasal drip or exudate noted.   NECK:  Supple w/ fair ROM; no JVD; normal carotid impulses w/o bruits; no thyromegaly or nodules palpated; no lymphadenopathy.  RESP  Diminished BS in bases .no accessory muscle use, no dullness to percussion  CARD:  RRR, no m/r/g  , 1-2 +peripheral edema, pulses intact, no  clubbing.  GI:   Soft & nt; nml bowel sounds; no organomegaly or masses detected.  Musco: Warm bil, no deformities or joint swelling noted.   Neuro: alert, no focal deficits noted.    Skin: Warm, no lesions or rashes        Assessment & Plan:

## 2012-12-04 NOTE — Patient Instructions (Addendum)
Begin Dulera 2 puffs Twice daily   Stop Symbicort .  We are referring you to cardiology for evaluation for possible heart failure .  follow up Dr. Delford Field  In 6 weeks As needed   Please contact office for sooner follow up if symptoms do not improve or worsen or seek emergency care

## 2012-12-04 NOTE — Assessment & Plan Note (Addendum)
Recent flare now resolved.  Cont on current regimen  Does have chronic leg swelling suspect she has underlying cor pulmonale . Will refer to cardiology for evaluation and consideration of echo. She also has several RF .   Plan Begin Dulera 2 puffs Twice daily   Stop Symbicort .  We are referring you to cardiology for evaluation for possible heart failure .  follow up Dr. Delford Field  In 6 weeks As needed   Please contact office for sooner follow up if symptoms do not improve or worsen or seek emergency care

## 2012-12-07 ENCOUNTER — Encounter: Payer: Self-pay | Admitting: Cardiology

## 2012-12-07 ENCOUNTER — Ambulatory Visit (INDEPENDENT_AMBULATORY_CARE_PROVIDER_SITE_OTHER): Payer: Medicare Other | Admitting: Cardiology

## 2012-12-07 VITALS — BP 103/64 | HR 68 | Ht 64.0 in | Wt 335.0 lb

## 2012-12-07 DIAGNOSIS — I509 Heart failure, unspecified: Secondary | ICD-10-CM

## 2012-12-07 DIAGNOSIS — R609 Edema, unspecified: Secondary | ICD-10-CM | POA: Diagnosis not present

## 2012-12-07 DIAGNOSIS — R0602 Shortness of breath: Secondary | ICD-10-CM

## 2012-12-07 DIAGNOSIS — R600 Localized edema: Secondary | ICD-10-CM

## 2012-12-07 MED ORDER — POTASSIUM CHLORIDE CRYS ER 20 MEQ PO TBCR: 20.0000 meq | EXTENDED_RELEASE_TABLET | Freq: Two times a day (BID) | ORAL | Status: DC

## 2012-12-07 MED ORDER — FUROSEMIDE 40 MG PO TABS: 40.0000 mg | ORAL_TABLET | Freq: Two times a day (BID) | ORAL | Status: DC

## 2012-12-07 NOTE — Patient Instructions (Addendum)
Increase lasix (furosemide) to 40mg  two times a day.   Increase KCL(potassium) to 20 mEq two times a day.   Your physician recommends that you have  lab work today--BMET/BNP  Your physician has requested that you have an echocardiogram. Echocardiography is a painless test that uses sound waves to create images of your heart. It provides your doctor with information about the size and shape of your heart and how well your heart's chambers and valves are working. This procedure takes approximately one hour. There are no restrictions for this procedure.  You have been referred to the Wound Center for evaluation for wrapping of your legs to help the swelling in your legs.    Your physician recommends that you return for lab work when you return from the beach.--BMET/BNP.  Your physician recommends that you schedule a follow-up appointment in: 1 month with Dr Shirlee Latch in the Heart Failure Clinic at the [facility] at [facility].   Limit your salt (sodium) intake to 1500mg  a day.

## 2012-12-08 LAB — BASIC METABOLIC PANEL
BUN: 23 mg/dL (ref 6–23)
CO2: 32 mEq/L (ref 19–32)
Calcium: 8.8 mg/dL (ref 8.4–10.5)
Chloride: 101 mEq/L (ref 96–112)
Creatinine, Ser: 1.1 mg/dL (ref 0.4–1.2)
GFR: 52.11 mL/min — ABNORMAL LOW (ref >60.00)
Glucose, Bld: 91 mg/dL (ref 70–99)
Potassium: 4.4 mEq/L (ref 3.5–5.1)
Sodium: 137 mEq/L (ref 135–145)

## 2012-12-08 LAB — BRAIN NATRIURETIC PEPTIDE: Pro B Natriuretic peptide (BNP): 104 pg/mL — ABNORMAL HIGH (ref 0.0–100.0)

## 2012-12-08 NOTE — Progress Notes (Signed)
Patient ID: Morgan Odonnell, female   DOB: [birth year], 62 y.o.   MRN: [id number] PCP: Dr. Eloise Harman  62 yo with history of COPD and obesity presents for evaluation for CHF.  She has a long history of COPD and is followed by  pulmonary.  Since she stopped working in 2011, she has gained 65 lbs.  She has developed significant lower extremity swelling.  She was started on Lasix 40 mg daily a couple of months ago. This has helped a bit but not much.  She is unable to get on compression stockings.  She is short of breath after walking about 50 feet.  She has trouble getting from her house out to her car.  +orthopnea.  No PND.  No chest pain.  She no longer smokes.  She is on oxygen at night (for nocturnal hypoxemia) and is trying to get Bipap.    ECG: NSR, 1st degree AV block, poor anterior R wave progression.  PMH: 1. COPD: PFTs (4/14) with FEV1 57%, ratio 70%.  2. Obesity 3. OHS: currently has home oxygen but trying to get on Bipap.  4. HTN 5. Chronic leg edema.   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  Prior heavy smoker, quit 12/12.   FH: Mother with CHF.   ROS: All systems reviewed and negative except as per HPI.   Current Outpatient Prescriptions  Medication Sig Dispense Refill  . albuterol (PROVENTIL HFA;VENTOLIN HFA) 108 (90 BASE) MCG/ACT inhaler Inhale 2 puffs into the lungs every 6 (six) hours as needed.      . citalopram (CELEXA) 20 MG tablet Take 20 mg by mouth daily.      . diclofenac sodium (VOLTAREN) 1 % GEL Apply topically.      . fexofenadine (ALLEGRA) 180 MG tablet Take 180 mg by mouth daily.      Marland Kitchen HYDROcodone-acetaminophen (NORCO/VICODIN) 5-325 MG per tablet Take 1 tablet by mouth every 4 (four) hours as needed.       . meloxicam (MOBIC) 15 MG tablet Take 15 mg by mouth daily.      . metoprolol tartrate (LOPRESSOR) 25 MG tablet Take 1 tablet by mouth 2 (two) times daily.      . MOMETASONE FUROATE EX Apply topically as needed.       .  mometasone-formoterol (DULERA) 200-5 MCG/ACT AERO Inhale 2 puffs into the lungs 2 (two) times daily.  1 Inhaler  5  . polyethylene glycol (MIRALAX / GLYCOLAX) packet Take 17 g by mouth daily.      Marland Kitchen tiotropium (SPIRIVA) 18 MCG inhalation capsule Place 18 mcg into inhaler and inhale daily.      . verapamil (CALAN) 120 MG tablet Take 120 mg by mouth 2 (two) times daily.      . WELLBUTRIN XL 150 MG 24 hr tablet Take 1 tablet by mouth every morning.      . furosemide (LASIX) 40 MG tablet Take 1 tablet (40 mg total) by mouth 2 (two) times daily.  60 tablet  6  . potassium chloride SA (K-DUR,KLOR-CON) 20 MEQ tablet Take 1 tablet (20 mEq total) by mouth 2 (two) times daily.  60 tablet  6   No current facility-administered medications for this visit.    BP 103/64  Pulse 68  Ht 5\' 4"  (1.626 m)  Wt 151.955 kg (335 lb)  BMI 57.47 kg/m2 General: Obese, NAD Neck: Thick, JVP difficult, no thyromegaly or thyroid nodule.  Lungs: Clear to auscultation bilaterally with normal respiratory effort. CV: Nondisplaced  PMI.  Heart regular S1/S2, no S3/S4, no murmur. 2+ chronic edema to knees.  No carotid bruit.  Unable to palpate pedal pulses in the setting of significant edema.  Abdomen: Soft, nontender, no hepatosplenomegaly, no distention.  Skin: Intact without lesions or rashes.  Neurologic: Alert and oriented x 3.  Psych: Normal affect. Extremities: No clubbing or cyanosis.  HEENT: Normal.   Assessment/Plan: 1. CHF: Patient has significant lower extremity edema.  Unable to assess JVP due to thickness of her neck.  She is certainly at risk for pulmonary hypertension/cor pulmonale in the setting of COPD and OHS.   - Echo to assess RV function and PA pressure.  - < 1500 mg sodium diet discussed.  - Increase Lasix to 40 mg bid and KCl to 20 mEeq bid.  - BMET/BNP today and in 2-3 weeks.   - She is not going to be able to get on compression stockings.  I will refer her to get her lower legs wrapped.  - I  would like to see her back in 1 month.  2. Obesity: Weight loss is going to be imperative.   Marca Ancona 12/08/2012

## 2012-12-11 ENCOUNTER — Telehealth (HOSPITAL_COMMUNITY): Payer: Self-pay | Admitting: *Deleted

## 2012-12-11 NOTE — Telephone Encounter (Signed)
Patient called and left voice mail inquiring about Dr Shirlee Latch referring her to the wound center pt was seen by Dr Shirlee Latch in his [address] office on 9/29, will forward message to Katina Dung, RN

## 2012-12-13 ENCOUNTER — Telehealth: Payer: Self-pay | Admitting: Pulmonary Disease

## 2012-12-13 NOTE — Telephone Encounter (Signed)
Pt called 1:30PM 12/13/12 stating that she is a pt of DrWright's, saw TP last week w/ bronchitis & treated w/ Doxy.  She claims TP wanted to put her on Pred but pt refused at that time.  Now calling for Pred Rx due to cough & continued symptoms.  She's had this in the past & helped.  I called in Pred dosepak- 5mg  6d pack to [facility] at [phone]...  50F w/ morbid obesity (BMI=57-8) and OHS. Ex-smoker, quit 59yr. Neg sleep study by drDohmeier 3/14 w/ AHI=3.3 and desat to 83% PFTs w/ mod restriction from obesity and small airways dis. PCP is DrPaterson- she did not contact him regarding this illness

## 2012-12-14 ENCOUNTER — Telehealth: Payer: Self-pay | Admitting: *Deleted

## 2012-12-14 NOTE — Telephone Encounter (Signed)
I received a message that the Wound Center would not take a referral for evaluation for lymphedema treatment unless pt has an non healing wound. I offered to refer patient to [facility] (PT) with [place] for this but she said she was currently seeing an orthopaedic specialist and would speak with him about this.

## 2012-12-14 NOTE — Telephone Encounter (Signed)
Dr Shirlee Latch referred her to Wound Center 12/07/12 to evaluate for wrapping her legs for lower extremity edema. I will forward to [facility] to schedule appt for pt and call patient.

## 2012-12-16 DIAGNOSIS — M545 Low back pain: Secondary | ICD-10-CM | POA: Diagnosis not present

## 2012-12-16 DIAGNOSIS — G894 Chronic pain syndrome: Secondary | ICD-10-CM | POA: Diagnosis not present

## 2012-12-16 DIAGNOSIS — M47817 Spondylosis without myelopathy or radiculopathy, lumbosacral region: Secondary | ICD-10-CM | POA: Diagnosis not present

## 2012-12-17 ENCOUNTER — Ambulatory Visit (HOSPITAL_COMMUNITY): Payer: Medicare Other | Attending: Cardiology

## 2012-12-17 ENCOUNTER — Other Ambulatory Visit: Payer: Self-pay | Admitting: Physical Medicine and Rehabilitation

## 2012-12-17 ENCOUNTER — Other Ambulatory Visit (HOSPITAL_COMMUNITY): Payer: Self-pay | Admitting: Cardiology

## 2012-12-17 ENCOUNTER — Telehealth (HOSPITAL_COMMUNITY): Payer: Self-pay

## 2012-12-17 ENCOUNTER — Other Ambulatory Visit (INDEPENDENT_AMBULATORY_CARE_PROVIDER_SITE_OTHER): Payer: Medicare Other

## 2012-12-17 DIAGNOSIS — I509 Heart failure, unspecified: Secondary | ICD-10-CM | POA: Diagnosis not present

## 2012-12-17 DIAGNOSIS — R600 Localized edema: Secondary | ICD-10-CM

## 2012-12-17 DIAGNOSIS — R0602 Shortness of breath: Secondary | ICD-10-CM | POA: Diagnosis not present

## 2012-12-17 DIAGNOSIS — R609 Edema, unspecified: Secondary | ICD-10-CM

## 2012-12-17 DIAGNOSIS — M545 Low back pain: Secondary | ICD-10-CM

## 2012-12-17 NOTE — Progress Notes (Signed)
Echocardiogram performed.  

## 2012-12-17 NOTE — Telephone Encounter (Signed)
Patient echocardiogram was stopped exam per patient request.  She was unable to tolerate apical images due to pain/pressure.  Patient refused Optison.

## 2012-12-18 LAB — BASIC METABOLIC PANEL
BUN: 26 mg/dL — ABNORMAL HIGH (ref 6–23)
CO2: 28 mEq/L (ref 19–32)
Calcium: 9.1 mg/dL (ref 8.4–10.5)
Chloride: 101 mEq/L (ref 96–112)
Creatinine, Ser: 1.3 mg/dL — ABNORMAL HIGH (ref 0.4–1.2)
GFR: 45.12 mL/min — ABNORMAL LOW (ref >60.00)
Glucose, Bld: 104 mg/dL — ABNORMAL HIGH (ref 70–99)
Potassium: 4.7 mEq/L (ref 3.5–5.1)
Sodium: 139 mEq/L (ref 135–145)

## 2012-12-18 LAB — BRAIN NATRIURETIC PEPTIDE: Pro B Natriuretic peptide (BNP): 147 pg/mL — ABNORMAL HIGH (ref 0.0–100.0)

## 2012-12-21 ENCOUNTER — Telehealth: Payer: Self-pay | Admitting: Cardiology

## 2012-12-21 NOTE — Telephone Encounter (Signed)
New Problem  Pt request a call back about ECHO results.

## 2012-12-21 NOTE — Telephone Encounter (Signed)
Spoke with patient about echo results 

## 2012-12-22 DIAGNOSIS — I83219 Varicose veins of right lower extremity with both ulcer of unspecified site and inflammation: Secondary | ICD-10-CM | POA: Diagnosis not present

## 2012-12-28 ENCOUNTER — Ambulatory Visit
Admission: RE | Admit: 2012-12-28 | Discharge: 2012-12-28 | Disposition: A | Discharge status: Home / Self Care | Payer: Medicare Other | Source: Ambulatory Visit | Attending: Physical Medicine and Rehabilitation | Admitting: Physical Medicine and Rehabilitation

## 2012-12-28 DIAGNOSIS — M48061 Spinal stenosis, lumbar region without neurogenic claudication: Secondary | ICD-10-CM | POA: Diagnosis not present

## 2012-12-28 DIAGNOSIS — M47817 Spondylosis without myelopathy or radiculopathy, lumbosacral region: Secondary | ICD-10-CM | POA: Diagnosis not present

## 2012-12-28 DIAGNOSIS — M545 Low back pain: Secondary | ICD-10-CM

## 2012-12-29 DIAGNOSIS — J449 Chronic obstructive pulmonary disease, unspecified: Secondary | ICD-10-CM | POA: Diagnosis not present

## 2012-12-29 DIAGNOSIS — L089 Local infection of the skin and subcutaneous tissue, unspecified: Secondary | ICD-10-CM | POA: Diagnosis not present

## 2012-12-29 DIAGNOSIS — L02519 Cutaneous abscess of unspecified hand: Secondary | ICD-10-CM | POA: Diagnosis not present

## 2012-12-29 DIAGNOSIS — I83219 Varicose veins of right lower extremity with both ulcer of unspecified site and inflammation: Secondary | ICD-10-CM | POA: Diagnosis not present

## 2012-12-29 DIAGNOSIS — L03019 Cellulitis of unspecified finger: Secondary | ICD-10-CM | POA: Diagnosis not present

## 2012-12-29 DIAGNOSIS — I10 Essential (primary) hypertension: Secondary | ICD-10-CM | POA: Diagnosis not present

## 2013-01-04 ENCOUNTER — Ambulatory Visit (HOSPITAL_COMMUNITY)
Admission: RE | Admit: 2013-01-04 | Discharge: 2013-01-04 | Disposition: A | Discharge status: Home / Self Care | Payer: Medicare Other | Source: Ambulatory Visit | Attending: Cardiology | Admitting: Cardiology

## 2013-01-04 VITALS — BP 165/78 | HR 91 | Wt 326.8 lb

## 2013-01-04 DIAGNOSIS — I5032 Chronic diastolic (congestive) heart failure: Secondary | ICD-10-CM | POA: Diagnosis not present

## 2013-01-04 DIAGNOSIS — R609 Edema, unspecified: Secondary | ICD-10-CM

## 2013-01-04 DIAGNOSIS — R600 Localized edema: Secondary | ICD-10-CM

## 2013-01-04 DIAGNOSIS — I509 Heart failure, unspecified: Secondary | ICD-10-CM | POA: Diagnosis not present

## 2013-01-04 NOTE — Patient Instructions (Addendum)
Follow up in 1b month  Do the following things EVERYDAY: 1) Weigh yourself in the morning before breakfast. Write it down and keep it in a log. 2) Take your medicines as prescribed 3) Eat low salt foods-Limit salt (sodium) to 2000 mg per day.  4) Stay as active as you can everyday 5) Limit all fluids for the day to less than 2 liters

## 2013-01-04 NOTE — Progress Notes (Signed)
Patient ID: Morgan Odonnell, female   DOB: September 22, 1950, 62 y.o.   MRN: [id number] PCP: Dr. Eloise Harman Orthopedic: Dr Lajoyce Corners   62 yo with history of COPD and obesity presents for evaluation for CHF.  She has a long history of COPD and is followed by [facility].  Since she stopped working in 2011, she has gained 65 lbs.    She returns for follow up. Last visit lasix was increased to 40 mg bid and potassium increased to 20 meq bid. Taking Keflex for 4 th finger infection. Complains of dyspnea with exertion.  Orthopnea. She is not weighing because she does not have a scale. Taking all medications. Followed by Dr Lajoyce Corners for lower extremity edema.Using home oxygen at night.    ECHO 12/17/12 EF 60-65% RV ok   Labs 12/07/12 K 4.4 Creatinine 1.1  Labs 12/17/12 K 4.7 creatinine 1.3, BNP 147  PMH: 1. COPD: PFTs (4/14) with FEV1 57%, ratio 70%.  2. Obesity 3. OHS: currently has home oxygen but trying to get on Bipap.  4. HTN 5. Chronic leg edema.  6. 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  Prior heavy smoker, quit 12/12.   FH: Mother with CHF.   ROS: All systems reviewed and negative except as per HPI.   Current Outpatient Prescriptions  Medication Sig Dispense Refill  . albuterol (PROVENTIL HFA;VENTOLIN HFA) 108 (90 BASE) MCG/ACT inhaler Inhale 2 puffs into the lungs every 6 (six) hours as needed.      . cephALEXin (KEFLEX) 500 MG capsule Take 500 mg by mouth 3 (three) times daily.      . citalopram (CELEXA) 20 MG tablet Take 20 mg by mouth daily.      . diclofenac sodium (VOLTAREN) 1 % GEL Apply topically.      . fexofenadine (ALLEGRA) 180 MG tablet Take 180 mg by mouth daily.      . furosemide (LASIX) 40 MG tablet Take 1 tablet (40 mg total) by mouth 2 (two) times daily.  60 tablet  6  . HYDROcodone-acetaminophen (NORCO/VICODIN) 5-325 MG per tablet Take 1 tablet by mouth every 4 (four) hours as needed.       .  meloxicam (MOBIC) 15 MG tablet Take 15 mg by mouth daily.      . metoprolol tartrate (LOPRESSOR) 25 MG tablet Take 1 tablet by mouth 2 (two) times daily.      . MOMETASONE FUROATE EX Apply topically as needed.       . mometasone-formoterol (DULERA) 200-5 MCG/ACT AERO Inhale 2 puffs into the lungs 2 (two) times daily.  1 Inhaler  5  . polyethylene glycol (MIRALAX / GLYCOLAX) packet Take 17 g by mouth daily.      . potassium chloride SA (K-DUR,KLOR-CON) 20 MEQ tablet Take 1 tablet (20 mEq total) by mouth 2 (two) times daily.  60 tablet  6  . tiotropium (SPIRIVA) 18 MCG inhalation capsule Place 18 mcg into inhaler and inhale daily.      . verapamil (CALAN) 120 MG tablet Take 120 mg by mouth 2 (two) times daily.      . WELLBUTRIN XL 150 MG 24 hr tablet Take 1 tablet by mouth every morning.       No current facility-administered medications for this encounter.    BP 165/78  Pulse 91  Wt 326 lb 12 oz (148.213 kg)  BMI 56.06 kg/m2  SpO2 94% General: Obese, NAD Neck: Thick, JVP  difficult, no thyromegaly or thyroid nodule.  Lungs: Clear to auscultation bilaterally with normal respiratory effort. CV: Nondisplaced PMI.  Heart regular S1/S2, no S3/S4, no murmur. 2+ chronic edema to knees.  No carotid bruit.  Unable to palpate pedal pulses in the setting of significant edema.  Abdomen: Soft, nontender, no hepatosplenomegaly, no distention.  Skin: Intact without lesions or rashes.  Neurologic: Alert and oriented x 3.  Psych: Normal affect. Extremities: No clubbing or cyanosis.  HEENT: Normal.   Assessment/Plan: 1. CHF:   Echo EF 60-65% RV ok  Reviewed BMET  from 10/9. Renal function stable. - Volume status stable . Weight down 9 pounds. Continue lasix 40 mg bid.  Discussed sliding scale diuretics and that if she gains 3 pounds in 1 day or 5 pounds in 1 week she can take an additional 40 mg of lasix. Reinforced daily weights, low salt food choices. Provided a scale and weight chart.  Check BMET at  next visit.  2. Obesity: Discussed portion control. Weight loss is going to be imperative.   Follow up in 1 month   Tonye Becket, NP 01/04/2013  Patient seen with NP, agree with the above note.  Weight is coming down.  Legs are being wrapped by Dr. Lajoyce Corners.  Continue current Lasix for gentle diuresis in setting of chronic diastolic CHF.  She is using oxygen at night and anticipates starting Bipap.   Marca Ancona 01/04/2013

## 2013-01-12 DIAGNOSIS — I83219 Varicose veins of right lower extremity with both ulcer of unspecified site and inflammation: Secondary | ICD-10-CM | POA: Diagnosis not present

## 2013-01-14 DIAGNOSIS — M545 Low back pain: Secondary | ICD-10-CM | POA: Diagnosis not present

## 2013-01-14 DIAGNOSIS — M47817 Spondylosis without myelopathy or radiculopathy, lumbosacral region: Secondary | ICD-10-CM | POA: Diagnosis not present

## 2013-01-20 ENCOUNTER — Telehealth: Payer: Self-pay | Admitting: Critical Care Medicine

## 2013-01-20 NOTE — Telephone Encounter (Signed)
lmomtc x1 for [employer]

## 2013-01-21 NOTE — Telephone Encounter (Signed)
Morgan Odonnell returning triage's call.  Morgan Odonnell

## 2013-01-21 NOTE — Telephone Encounter (Signed)
noted 

## 2013-01-21 NOTE — Telephone Encounter (Signed)
Spoke with Melissa with [facility]  She states that pt has medicare as of March 2014 and so they are needing new qualifying sats to be done at [facility] 11/18 I have added this comment into her appt notes that this will be needed  She also states that PW will need to be sure and document in note regarding the medical necc for o2 Will forward to PW so that he is made aware  Thanks!

## 2013-01-26 ENCOUNTER — Encounter: Payer: Self-pay | Admitting: Critical Care Medicine

## 2013-01-26 ENCOUNTER — Ambulatory Visit (INDEPENDENT_AMBULATORY_CARE_PROVIDER_SITE_OTHER): Payer: Medicare Other | Admitting: Critical Care Medicine

## 2013-01-26 VITALS — BP 110/68 | HR 68 | Temp 98.2°F | Ht 62.0 in | Wt 339.0 lb

## 2013-01-26 DIAGNOSIS — Z23 Encounter for immunization: Secondary | ICD-10-CM | POA: Diagnosis not present

## 2013-01-26 DIAGNOSIS — J449 Chronic obstructive pulmonary disease, unspecified: Secondary | ICD-10-CM

## 2013-01-26 DIAGNOSIS — I890 Lymphedema, not elsewhere classified: Secondary | ICD-10-CM | POA: Insufficient documentation

## 2013-01-26 DIAGNOSIS — J439 Emphysema, unspecified: Secondary | ICD-10-CM

## 2013-01-26 DIAGNOSIS — J438 Other emphysema: Secondary | ICD-10-CM | POA: Diagnosis not present

## 2013-01-26 NOTE — Patient Instructions (Signed)
Pneumonia vaccine given No change in medications Recertification overnight sleep oxygen test will be obtained Return 4 months

## 2013-01-26 NOTE — Progress Notes (Signed)
Subjective:    Patient ID: Morgan Odonnell, female    DOB: 1951/01/25, 62 y.o.   MRN: [id number]  HPI  62 yo with hx of COPD  PFTs 06/15/2012: FeV1 57%  FeV1/FVC 70%  Fef 27 75 25% TLC 66%  DLCO 52%    Hypercarbic RF on nocturnal O2 at 2l/m  Sleep study 05/2012 , no OSA, +desats .    01/26/2013 Chief Complaint  Patient presents with  . 6 wk follow up    Not much difference in breathing since last OV with TP.  Still gets SOB with minimal activity and wheezing.  No cough.  O2 needs to be re-qualified.    Now on dulera.  Uses oxygen at night .  No real cough. Dyspnea the same.  occ wheeze. Pt denies any significant sore throat, nasal congestion or excess secretions, fever, chills, sweats, unintended weight loss, pleurtic or exertional chest pain, orthopnea PND, or leg swelling Pt denies any increase in rescue therapy over baseline, denies waking up needing it or having any early am or nocturnal exacerbations of coughing/wheezing/or dyspnea. Pt also denies any obvious fluctuation in symptoms with  weather or environmental change or other alleviating or aggravating factors  Now on bactrim for cellulitis, PCP rx.  Dx lymphedema.    Review of Systems  Constitutional:   No  weight loss, night sweats,  Fevers, chills,  +fatigue, or  lassitude.  HEENT:   No headaches,  Difficulty swallowing,  Tooth/dental problems, or  Sore throat,                No sneezing, itching, ear ache,  +nasal congestion, post nasal drip,   CV:  No chest pain,  Orthopnea, PND,  anasarca, dizziness, palpitations, syncope.   GI  No heartburn, indigestion, abdominal pain, nausea, vomiting, diarrhea, change in bowel habits, loss of appetite, bloody stools.   Resp:.  No chest wall deformity  Skin: no rash or lesions.  GU: no dysuria, change in color of urine, no urgency or frequency.  No flank pain, no hematuria   MS:  +chronic back pain.  Psych:  No change in mood or affect. No depression or anxiety.  No  memory loss.      Objective:   Physical Exam  BP 110/68  Pulse 68  Temp(Src) 98.2 F (36.8 C) (Oral)  Ht 5\' 2"  (1.575 m)  Wt 339 lb (153.769 kg)  BMI 61.99 kg/m2  SpO2 96%  GEN: A/Ox3; pleasant , NAD, morbidly obese   HEENT:  [place]/AT,  EACs-clear, TMs-wnl, NOSE-clear, THROAT-clear, no lesions, no postnasal drip or exudate noted.   NECK:  Supple w/ fair ROM; no JVD; normal carotid impulses w/o bruits; no thyromegaly or nodules palpated; no lymphadenopathy.  RESP  Diminished BS in bases .no accessory muscle use, no dullness to percussion  CARD:  RRR, no m/r/g  , 1-2 +peripheral edema, pulses intact, no  clubbing.  GI:   Soft & nt; nml bowel sounds; no organomegaly or masses detected.  Musco: Warm bil, no deformities or joint swelling noted.   Neuro: alert, no focal deficits noted.    Skin: Warm, no lesions or rashes        Assessment & Plan:   COPD (chronic obstructive pulmonary disease)Gold C Gold stage C. COPD stable at this time Pneumonia vaccine given No change in medications Recertification overnight sleep oxygen test will be obtained Return 4 months    Updated Medication List Outpatient Encounter Prescriptions as of 01/26/2013  Medication  Sig  . albuterol (PROVENTIL HFA;VENTOLIN HFA) 108 (90 BASE) MCG/ACT inhaler Inhale 2 puffs into the lungs every 6 (six) hours as needed.  . baclofen (LIORESAL) 10 MG tablet Take 1 tablet by mouth 3 (three) times daily as needed.  . citalopram (CELEXA) 20 MG tablet Take 20 mg by mouth daily.  . diclofenac sodium (VOLTAREN) 1 % GEL Apply topically.  . fexofenadine (ALLEGRA) 180 MG tablet Take 180 mg by mouth daily.  . furosemide (LASIX) 40 MG tablet Take 1 tablet (40 mg total) by mouth 2 (two) times daily.  Marland Kitchen HYDROcodone-acetaminophen (NORCO/VICODIN) 5-325 MG per tablet Take 1 tablet by mouth every 4 (four) hours as needed.   . meloxicam (MOBIC) 15 MG tablet Take 15 mg by mouth daily.  . metoprolol tartrate (LOPRESSOR) 25  MG tablet Take 1 tablet by mouth 2 (two) times daily.  . MOMETASONE FUROATE EX Apply topically as needed.   . mometasone-formoterol (DULERA) 200-5 MCG/ACT AERO Inhale 2 puffs into the lungs 2 (two) times daily.  . polyethylene glycol (MIRALAX / GLYCOLAX) packet Take 17 g by mouth daily.  . potassium chloride SA (K-DUR,KLOR-CON) 20 MEQ tablet Take 1 tablet (20 mEq total) by mouth 2 (two) times daily.  Marland Kitchen sulfamethoxazole-trimethoprim (BACTRIM DS,SEPTRA DS) 800-160 MG per tablet Take 2 tablets by mouth 2 (two) times daily.  Marland Kitchen tiotropium (SPIRIVA) 18 MCG inhalation capsule Place 18 mcg into inhaler and inhale daily.  . verapamil (CALAN) 120 MG tablet Take 120 mg by mouth 2 (two) times daily.  . WELLBUTRIN XL 150 MG 24 hr tablet Take 1 tablet by mouth every morning.  . [DISCONTINUED] cephALEXin (KEFLEX) 500 MG capsule Take 500 mg by mouth 3 (three) times daily.

## 2013-01-27 NOTE — Assessment & Plan Note (Signed)
Gold stage C. COPD stable at this time Pneumonia vaccine given No change in medications Recertification overnight sleep oxygen test will be obtained Return 4 months

## 2013-01-28 DIAGNOSIS — M545 Low back pain: Secondary | ICD-10-CM | POA: Diagnosis not present

## 2013-01-28 DIAGNOSIS — M47817 Spondylosis without myelopathy or radiculopathy, lumbosacral region: Secondary | ICD-10-CM | POA: Diagnosis not present

## 2013-01-29 ENCOUNTER — Telehealth: Payer: Self-pay | Admitting: Critical Care Medicine

## 2013-01-29 DIAGNOSIS — J449 Chronic obstructive pulmonary disease, unspecified: Secondary | ICD-10-CM | POA: Diagnosis not present

## 2013-01-29 NOTE — Telephone Encounter (Signed)
I called pt and advised her it is w/o O2. Nothing further needed

## 2013-02-01 ENCOUNTER — Encounter (HOSPITAL_COMMUNITY): Payer: Medicare Other

## 2013-02-03 ENCOUNTER — Telehealth: Payer: Self-pay | Admitting: Critical Care Medicine

## 2013-02-03 NOTE — Telephone Encounter (Signed)
Called, spoke with pt.  Informed her of below results and recs per Dr. Delford Field. She verbalized understanding of this and voiced no further questions or concerns at this time.

## 2013-02-03 NOTE — Telephone Encounter (Signed)
Please call [phone] and leave a message. It is her private phone. She will not be able to talk much today.

## 2013-02-03 NOTE — Telephone Encounter (Signed)
ONO positive on RA.  She needs to stay on oxygen 2L QHS

## 2013-02-03 NOTE — Telephone Encounter (Signed)
lmomtcb for pt 

## 2013-03-09 DIAGNOSIS — L97209 Non-pressure chronic ulcer of unspecified calf with unspecified severity: Secondary | ICD-10-CM | POA: Diagnosis not present

## 2013-03-09 DIAGNOSIS — I83219 Varicose veins of right lower extremity with both ulcer of unspecified site and inflammation: Secondary | ICD-10-CM | POA: Diagnosis not present

## 2013-03-24 DIAGNOSIS — I83229 Varicose veins of left lower extremity with both ulcer of unspecified site and inflammation: Secondary | ICD-10-CM | POA: Diagnosis not present

## 2013-03-24 DIAGNOSIS — L97209 Non-pressure chronic ulcer of unspecified calf with unspecified severity: Secondary | ICD-10-CM | POA: Diagnosis not present

## 2013-03-24 DIAGNOSIS — I83219 Varicose veins of right lower extremity with both ulcer of unspecified site and inflammation: Secondary | ICD-10-CM | POA: Diagnosis not present

## 2013-03-31 DIAGNOSIS — M48061 Spinal stenosis, lumbar region without neurogenic claudication: Secondary | ICD-10-CM | POA: Diagnosis not present

## 2013-03-31 DIAGNOSIS — M545 Low back pain, unspecified: Secondary | ICD-10-CM | POA: Diagnosis not present

## 2013-03-31 DIAGNOSIS — IMO0002 Reserved for concepts with insufficient information to code with codable children: Secondary | ICD-10-CM | POA: Diagnosis not present

## 2013-04-16 DIAGNOSIS — F331 Major depressive disorder, recurrent, moderate: Secondary | ICD-10-CM | POA: Diagnosis not present

## 2013-04-23 DIAGNOSIS — Z79899 Other long term (current) drug therapy: Secondary | ICD-10-CM | POA: Diagnosis not present

## 2013-04-23 DIAGNOSIS — I10 Essential (primary) hypertension: Secondary | ICD-10-CM | POA: Diagnosis not present

## 2013-04-29 DIAGNOSIS — M545 Low back pain, unspecified: Secondary | ICD-10-CM | POA: Diagnosis not present

## 2013-04-29 DIAGNOSIS — N183 Chronic kidney disease, stage 3 unspecified: Secondary | ICD-10-CM | POA: Diagnosis not present

## 2013-04-29 DIAGNOSIS — Z1331 Encounter for screening for depression: Secondary | ICD-10-CM | POA: Diagnosis not present

## 2013-04-29 DIAGNOSIS — Z0000 Encounter for general adult medical examination without abnormal findings: Secondary | ICD-10-CM | POA: Diagnosis not present

## 2013-04-29 DIAGNOSIS — J449 Chronic obstructive pulmonary disease, unspecified: Secondary | ICD-10-CM | POA: Diagnosis not present

## 2013-04-29 DIAGNOSIS — I10 Essential (primary) hypertension: Secondary | ICD-10-CM | POA: Diagnosis not present

## 2013-04-29 DIAGNOSIS — I872 Venous insufficiency (chronic) (peripheral): Secondary | ICD-10-CM | POA: Diagnosis not present

## 2013-04-29 DIAGNOSIS — E669 Obesity, unspecified: Secondary | ICD-10-CM | POA: Diagnosis not present

## 2013-04-29 DIAGNOSIS — E662 Morbid (severe) obesity with alveolar hypoventilation: Secondary | ICD-10-CM | POA: Diagnosis not present

## 2013-05-31 ENCOUNTER — Other Ambulatory Visit: Payer: Self-pay | Admitting: Gastroenterology

## 2013-05-31 DIAGNOSIS — R197 Diarrhea, unspecified: Secondary | ICD-10-CM | POA: Diagnosis not present

## 2013-05-31 DIAGNOSIS — Z1211 Encounter for screening for malignant neoplasm of colon: Secondary | ICD-10-CM | POA: Diagnosis not present

## 2013-05-31 DIAGNOSIS — J449 Chronic obstructive pulmonary disease, unspecified: Secondary | ICD-10-CM | POA: Diagnosis not present

## 2013-06-04 ENCOUNTER — Encounter (HOSPITAL_COMMUNITY): Payer: Self-pay | Admitting: *Deleted

## 2013-06-04 ENCOUNTER — Encounter (HOSPITAL_COMMUNITY): Payer: Self-pay | Admitting: Pharmacy Technician

## 2013-06-18 DIAGNOSIS — Z1212 Encounter for screening for malignant neoplasm of rectum: Secondary | ICD-10-CM | POA: Diagnosis not present

## 2013-06-25 ENCOUNTER — Encounter (HOSPITAL_COMMUNITY)
Admission: RE | Disposition: A | Discharge status: Home / Self Care | Payer: Self-pay | Source: Ambulatory Visit | Attending: Gastroenterology

## 2013-06-25 ENCOUNTER — Ambulatory Visit (HOSPITAL_COMMUNITY)
Admission: RE | Admit: 2013-06-25 | Discharge: 2013-06-25 | Disposition: A | Discharge status: Home / Self Care | Payer: Medicare Other | Source: Ambulatory Visit | Attending: Gastroenterology | Admitting: Gastroenterology

## 2013-06-25 ENCOUNTER — Ambulatory Visit (HOSPITAL_COMMUNITY): Payer: Medicare Other | Admitting: Registered Nurse

## 2013-06-25 ENCOUNTER — Encounter (HOSPITAL_COMMUNITY): Payer: Medicare Other | Admitting: Registered Nurse

## 2013-06-25 ENCOUNTER — Encounter (HOSPITAL_COMMUNITY): Payer: Self-pay | Admitting: *Deleted

## 2013-06-25 DIAGNOSIS — K5520 Angiodysplasia of colon without hemorrhage: Secondary | ICD-10-CM | POA: Diagnosis not present

## 2013-06-25 DIAGNOSIS — Z9089 Acquired absence of other organs: Secondary | ICD-10-CM | POA: Diagnosis not present

## 2013-06-25 DIAGNOSIS — Z9981 Dependence on supplemental oxygen: Secondary | ICD-10-CM | POA: Diagnosis not present

## 2013-06-25 DIAGNOSIS — Z87891 Personal history of nicotine dependence: Secondary | ICD-10-CM | POA: Diagnosis not present

## 2013-06-25 DIAGNOSIS — I10 Essential (primary) hypertension: Secondary | ICD-10-CM | POA: Insufficient documentation

## 2013-06-25 DIAGNOSIS — K5730 Diverticulosis of large intestine without perforation or abscess without bleeding: Secondary | ICD-10-CM | POA: Diagnosis not present

## 2013-06-25 DIAGNOSIS — J449 Chronic obstructive pulmonary disease, unspecified: Secondary | ICD-10-CM | POA: Diagnosis not present

## 2013-06-25 DIAGNOSIS — Z1211 Encounter for screening for malignant neoplasm of colon: Secondary | ICD-10-CM | POA: Diagnosis not present

## 2013-06-25 DIAGNOSIS — K219 Gastro-esophageal reflux disease without esophagitis: Secondary | ICD-10-CM | POA: Diagnosis not present

## 2013-06-25 DIAGNOSIS — D126 Benign neoplasm of colon, unspecified: Secondary | ICD-10-CM | POA: Diagnosis not present

## 2013-06-25 DIAGNOSIS — J4489 Other specified chronic obstructive pulmonary disease: Secondary | ICD-10-CM | POA: Insufficient documentation

## 2013-06-25 HISTORY — DX: Gastro-esophageal reflux disease without esophagitis: K21.9

## 2013-06-25 HISTORY — DX: Cardiac murmur, unspecified: R01.1

## 2013-06-25 HISTORY — DX: Shortness of breath: R06.02

## 2013-06-25 HISTORY — PX: COLONOSCOPY: SHX5424

## 2013-06-25 LAB — COMPREHENSIVE METABOLIC PANEL
ALT: 15 U/L (ref 0–35)
AST: 17 U/L (ref 0–37)
Albumin: 3.4 g/dL — ABNORMAL LOW (ref 3.5–5.2)
Alkaline Phosphatase: 97 U/L (ref 39–117)
BUN: 19 mg/dL (ref 6–23)
CO2: 29 mEq/L (ref 19–32)
Calcium: 9.5 mg/dL (ref 8.4–10.5)
Chloride: 97 mEq/L (ref 96–112)
Creatinine, Ser: 0.87 mg/dL (ref 0.50–1.10)
GFR calc Af Amer: 82 mL/min — ABNORMAL LOW (ref >90)
GFR calc non Af Amer: 70 mL/min — ABNORMAL LOW (ref >90)
Glucose, Bld: 82 mg/dL (ref 70–99)
Potassium: 4 mEq/L (ref 3.7–5.3)
Sodium: 138 mEq/L (ref 137–147)
Total Bilirubin: 0.6 mg/dL (ref 0.3–1.2)
Total Protein: 7.7 g/dL (ref 6.0–8.3)

## 2013-06-25 SURGERY — COLONOSCOPY
Anesthesia: Monitor Anesthesia Care

## 2013-06-25 MED ORDER — PROPOFOL 10 MG/ML IV BOLUS
INTRAVENOUS | Status: AC
  Filled 2013-06-25: qty 20

## 2013-06-25 MED ORDER — LIDOCAINE HCL 1 % IJ SOLN
INTRAMUSCULAR | Status: AC
  Filled 2013-06-25: qty 20

## 2013-06-25 MED ORDER — PROPOFOL 10 MG/ML IV BOLUS
INTRAVENOUS | Status: DC | PRN
  Administered 2013-06-25 (×2): 20 mg via INTRAVENOUS
  Administered 2013-06-25: 25 mg via INTRAVENOUS
  Administered 2013-06-25: 20 mg via INTRAVENOUS
  Administered 2013-06-25 (×3): 50 mg via INTRAVENOUS

## 2013-06-25 MED ORDER — LACTATED RINGERS IV SOLN
INTRAVENOUS | Status: DC
  Administered 2013-06-25: 1000 mL via INTRAVENOUS

## 2013-06-25 MED ORDER — KETAMINE HCL 50 MG/ML IJ SOLN
INTRAMUSCULAR | Status: DC | PRN
  Administered 2013-06-25 (×2): 20 mg via INTRAMUSCULAR

## 2013-06-25 MED ORDER — SODIUM CHLORIDE 0.9 % IV SOLN
INTRAVENOUS | Status: DC
Start: 2013-06-25 — End: 2013-06-25

## 2013-06-25 MED ORDER — MIDAZOLAM HCL 2 MG/2ML IJ SOLN
INTRAMUSCULAR | Status: AC
  Filled 2013-06-25: qty 2

## 2013-06-25 MED ORDER — LIDOCAINE HCL (CARDIAC) 20 MG/ML IV SOLN
INTRAVENOUS | Status: AC
  Filled 2013-06-25: qty 5

## 2013-06-25 NOTE — Anesthesia Preprocedure Evaluation (Signed)
Anesthesia Evaluation  Patient identified by MRN, date of birth, ID band Patient awake    Reviewed: Allergy & Precautions, H&P , NPO status , Patient's Chart, lab work & pertinent test results  Airway Mallampati: II TM Distance: >3 FB Neck ROM: Full    Dental no notable dental hx. (+) Edentulous Upper, Edentulous Lower   Pulmonary COPD COPD inhaler and oxygen dependent, former smoker,  breath sounds clear to auscultation  Pulmonary exam normal       Cardiovascular hypertension, Pt. on medications +CHF Rhythm:Regular Rate:Normal     Neuro/Psych negative neurological ROS  negative psych ROS   GI/Hepatic negative GI ROS, Neg liver ROS,   Endo/Other  Morbid obesity  Renal/GU negative Renal ROS  negative genitourinary   Musculoskeletal negative musculoskeletal ROS (+)   Abdominal   Peds negative pediatric ROS (+)  Hematology negative hematology ROS (+)   Anesthesia Other Findings   Reproductive/Obstetrics negative OB ROS                           Anesthesia Physical Anesthesia Plan  ASA: IV  Anesthesia Plan: MAC   Post-op Pain Management:    Induction:   Airway Management Planned:   Additional Equipment:   Intra-op Plan:   Post-operative Plan:   Informed Consent: I have reviewed the patients History and Physical, chart, labs and discussed the procedure including the risks, benefits and alternatives for the proposed anesthesia with the patient or authorized representative who has indicated his/her understanding and acceptance.   Dental advisory given  Plan Discussed with:   Anesthesia Plan Comments:         Anesthesia Quick Evaluation

## 2013-06-25 NOTE — Transfer of Care (Signed)
Immediate Anesthesia Transfer of Care Note  Patient: Morgan Odonnell  Procedure(s) Performed: Procedure(s): COLONOSCOPY (N/A)  Patient Location: PACU and Endoscopy Unit  Anesthesia Type:MAC  Level of Consciousness: awake, alert , oriented and patient cooperative  Airway & Oxygen Therapy: Patient Spontanous Breathing and Patient connected to face mask oxygen  Post-op Assessment: Report given to PACU RN, Post -op Vital signs reviewed and stable and Patient moving all extremities  Post vital signs: Reviewed and stable  Complications: No apparent anesthesia complications

## 2013-06-25 NOTE — H&P (Signed)
   Morgan Odonnell HPI: This is a 63 year old female here today for a screening colonoscopy.  At this time she denies any problems with nausea, vomiting, fevers, chills, abdominal pain, constipation, hematochezia, melena, GERD, or dysphagia.  The patient denies any known family history of colon cancer.  No complaints of chest pain, SOB, or MI.  In 2005 Dr. Collene Mares performed her index colonoscopy with findings of sigmoid diverticula and hyperplastic polyps.    Past Medical History  Diagnosis Date  . Oral lesion   . Leg edema     chronic, bilateral  . Morbid obesity   . Chronic cough   . COPD (chronic obstructive pulmonary disease)   . Hypertension   . Low back pain   . Snoring   . GERD (gastroesophageal reflux disease)   . Heart murmur   . Shortness of breath     Past Surgical History  Procedure Laterality Date  . Meniscus repair    . Cholecystectomy    . Appendectomy    . Tubal ligation    . Ectopic pregnancy surgery    . Tonsillectomy      Family History  Problem Relation Age of Onset  . Cancer Mother     ovarian  . Cancer Brother     lymphomia  . Cancer Maternal Aunt     ovarian  . Lung disease Mother   . Asthma Daughter   . Heart failure Mother     Social History:  reports that she quit smoking about 2 years ago. Her smoking use included Cigarettes. She has a 67.5 pack-year smoking history. She does not have any smokeless tobacco history on file. She reports that she does not drink alcohol or use illicit drugs.  Allergies:  Allergies  Allergen Reactions  . Gabapentin     "slept for 2 days"    Medications:  Scheduled:  Continuous: . sodium chloride    . lactated ringers 1,000 mL (06/25/13 1040)    No results found for this or any previous visit (from the past 24 hour(s)).   No results found.  ROS:  As stated above in the HPI otherwise negative.  Blood pressure 146/88, temperature 98 F (36.7 C), temperature source Oral, resp. rate 16, height 5\' 2"   (1.575 m), weight 350 lb (158.759 kg), SpO2 94.00%.    PE: Gen: NAD, Alert and Oriented HEENT:  [name], EOMI, thick neck. Neck: Supple, no LAD Lungs: CTA Bilaterally CV: RRR without M/G/R ABM: Soft, NTND, morbidly obese, +BS Ext: No C/C/E  Assessment/Plan: 1) Screening colonoscopy.  [name] 06/25/2013, 10:52 AM

## 2013-06-25 NOTE — Anesthesia Postprocedure Evaluation (Signed)
  Anesthesia Post-op Note  Patient: Morgan Odonnell  Procedure(s) Performed: Procedure(s) (LRB): COLONOSCOPY (N/A)  Patient Location: PACU  Anesthesia Type: MAC  Level of Consciousness: awake and alert   Airway and Oxygen Therapy: Patient Spontanous Breathing  Post-op Pain: mild  Post-op Assessment: Post-op Vital signs reviewed, Patient's Cardiovascular Status Stable, Respiratory Function Stable, Patent Airway and No signs of Nausea or vomiting  Last Vitals:  Filed Vitals:   06/25/13 1220  BP: 144/82  Temp:   Resp: 16    Post-op Vital Signs: stable   Complications: No apparent anesthesia complications

## 2013-06-25 NOTE — Op Note (Signed)
[facility] [place], [address]   OPERATIVE PROCEDURE REPORT  PATIENT: Morgan, Odonnell  MR#: [id number] BIRTHDATE: 1950/09/06  GENDER: Female ENDOSCOPIST: Carol Ada, MD ASSISTANT:   Sharon Mt, Endo Technician Hilma Favors, RN PROCEDURE DATE: 06/25/2013 PROCEDURE:   Colonoscopy with snare polypectomy ASA CLASS:   Class III INDICATIONS: Screening MEDICATIONS: MAC sedation, administered by CRNA  DESCRIPTION OF PROCEDURE:   After the risks benefits and alternatives of the procedure were thoroughly explained, informed consent was obtained.  A digital rectal exam revealed no abnormalities of the rectum.    The Pentax Adult Colonscope [id number] endoscope was introduced through the anus  and advanced to the cecum, which was identified by both the appendix and ileocecal valve , No adverse events experienced.    The quality of the prep was good. .  The instrument was then slowly withdrawn as the colon was fully examined.     FINDINGS: In the cecum and the ascending colon, 3-4 nonbleeding small to medium-sized AVMs were identified.  Two 4 mm sessile transverse colon polyps were removed with a cold snare.  A 3 mm sessile sigmoid colon polyp was removed with a cold snare. Scattered diverticula were noted.   Retroflexed views revealed no abnormalities.     The scope was then withdrawn from the patient and the procedure terminated.  COMPLICATIONS: There were no complications.  IMPRESSION: 1) Polyps. 2) AVMs. 3) Diverticulosis.  RECOMMENDATIONS: 1) Await biopsy results. 2) Repeat the colonoscopy in 3-5 years.  _______________________________ eSignedCarol Ada, MD 06/25/2013 11:42 AM   PATIENT NAME:  Morgan, Odonnell MR#: [id number]

## 2013-06-25 NOTE — Discharge Instructions (Signed)
Colonoscopy, Care After °Refer to this sheet in the next few weeks. These instructions provide you with information on caring for yourself after your procedure. Your health care provider may also give you more specific instructions. Your treatment has been planned according to current medical practices, but problems sometimes occur. Call your health care provider if you have any problems or questions after your procedure. °WHAT TO EXPECT AFTER THE PROCEDURE  °After your procedure, it is typical to have the following: °· A small amount of blood in your stool. °· Moderate amounts of gas and mild abdominal cramping or bloating. °HOME CARE INSTRUCTIONS °· Do not drive, operate machinery, or sign important documents for 24 hours. °· You may shower and resume your regular physical activities, but move at a slower pace for the first 24 hours. °· Take frequent rest periods for the first 24 hours. °· Walk around or put a warm pack on your abdomen to help reduce abdominal cramping and bloating. °· Drink enough fluids to keep your urine clear or pale yellow. °· You may resume your normal diet as instructed by your health care provider. Avoid heavy or fried foods that are hard to digest. °· Avoid drinking alcohol for 24 hours or as instructed by your health care provider. °· Only take over-the-counter or prescription medicines as directed by your health care provider. °· If a tissue sample (biopsy) was taken during your procedure: °· Do not take aspirin or blood thinners for 7 days, or as instructed by your health care provider. °· Do not drink alcohol for 7 days, or as instructed by your health care provider. °· Eat soft foods for the first 24 hours. °SEEK MEDICAL CARE IF: °You have persistent spotting of blood in your stool 2 3 days after the procedure. °SEEK IMMEDIATE MEDICAL CARE IF: °· You have more than a small spotting of blood in your stool. °· You pass large blood clots in your stool. °· Your abdomen is swollen  (distended). °· You have nausea or vomiting. °· You have a fever. °· You have increasing abdominal pain that is not relieved with medicine. °Document Released: 10/10/2003 Document Revised: 12/16/2012 Document Reviewed: 11/02/2012 °ExitCare® Patient Information ©2014 [employer]. ° °

## 2013-06-28 ENCOUNTER — Encounter (HOSPITAL_COMMUNITY): Payer: Self-pay | Admitting: Gastroenterology

## 2013-07-30 ENCOUNTER — Ambulatory Visit (INDEPENDENT_AMBULATORY_CARE_PROVIDER_SITE_OTHER): Payer: Medicare Other | Admitting: Critical Care Medicine

## 2013-07-30 ENCOUNTER — Encounter: Payer: Self-pay | Admitting: Critical Care Medicine

## 2013-07-30 VITALS — BP 124/82 | HR 70 | Temp 98.5°F | Ht 62.0 in | Wt 343.0 lb

## 2013-07-30 DIAGNOSIS — J9612 Chronic respiratory failure with hypercapnia: Secondary | ICD-10-CM

## 2013-07-30 DIAGNOSIS — J9610 Chronic respiratory failure, unspecified whether with hypoxia or hypercapnia: Secondary | ICD-10-CM

## 2013-07-30 DIAGNOSIS — J449 Chronic obstructive pulmonary disease, unspecified: Secondary | ICD-10-CM | POA: Diagnosis not present

## 2013-07-30 DIAGNOSIS — J4489 Other specified chronic obstructive pulmonary disease: Secondary | ICD-10-CM | POA: Diagnosis not present

## 2013-07-30 NOTE — Patient Instructions (Signed)
An overnight sleep oxygen test was obtained on 2 liters oxygen An arterial blood gas will be obtained We will call with results No change in medications Return 4 months

## 2013-07-30 NOTE — Progress Notes (Signed)
Subjective:    Patient ID: Morgan Odonnell, female    DOB: 1950/11/19, 63 y.o.   MRN: [id number]  HPI  63 yo with hx of COPD  PFTs 06/15/2012: FeV1 57%  FeV1/FVC 70%  Fef 27 75 25% TLC 66%  DLCO 52%    Hypercarbic RF on nocturnal O2 at 2l/m  Sleep study 05/2012 , no OSA, +desats .    01/26/2013 Chief Complaint  Patient presents with  . 6 wk follow up    Not much difference in breathing since last OV with TP.  Still gets SOB with minimal activity and wheezing.  No cough.  O2 needs to be re-qualified.    Now on dulera.  Uses oxygen at night .  No real cough. Dyspnea the same.  occ wheeze. Pt denies any significant sore throat, nasal congestion or excess secretions, fever, chills, sweats, unintended weight loss, pleurtic or exertional chest pain, orthopnea PND, or leg swelling Pt denies any increase in rescue therapy over baseline, denies waking up needing it or having any early am or nocturnal exacerbations of coughing/wheezing/or dyspnea. Pt also denies any obvious fluctuation in symptoms with  weather or environmental change or other alleviating or aggravating factors  Now on bactrim for cellulitis, PCP rx.  Dx lymphedema.    07/30/2013 Chief Complaint  Patient presents with  . COPD    Breathing is unchanged. Reports SOB, chest tightness, coughing. Denies wheezing at this time.  Since last ov about the same.  Now awakens up 3am with a headache and dizzy when gets up.  Difficulty catching balance.   Review of Systems  Constitutional:   No  weight loss, night sweats,  Fevers, chills,  +fatigue, or  lassitude.  HEENT:   No headaches,  Difficulty swallowing,  Tooth/dental problems, or  Sore throat,                No sneezing, itching, ear ache,  +nasal congestion, post nasal drip,   CV:  No chest pain,  Orthopnea, PND,  anasarca, dizziness, palpitations, syncope.   GI  No heartburn, indigestion, abdominal pain, nausea, vomiting, diarrhea, change in bowel habits, loss of  appetite, bloody stools.   Resp:.  No chest wall deformity  Skin: no rash or lesions.  GU: no dysuria, change in color of urine, no urgency or frequency.  No flank pain, no hematuria   MS:  +chronic back pain.  Psych:  No change in mood or affect. No depression or anxiety.  No memory loss.      Objective:   Physical Exam  BP 124/82  Pulse 70  Temp(Src) 98.5 F (36.9 C) (Oral)  Ht 5\' 2"  (1.575 m)  Wt 343 lb (155.584 kg)  BMI 62.72 kg/m2  SpO2 93%  GEN: A/Ox3; pleasant , NAD, morbidly obese   HEENT:  [place]/AT,  EACs-clear, TMs-wnl, NOSE-clear, THROAT-clear, no lesions, no postnasal drip or exudate noted.   NECK:  Supple w/ fair ROM; no JVD; normal carotid impulses w/o bruits; no thyromegaly or nodules palpated; no lymphadenopathy.  RESP  Diminished BS in bases .no accessory muscle use, no dullness to percussion  CARD:  RRR, no m/r/g  , 1-2 +peripheral edema, pulses intact, no  clubbing.  GI:   Soft & nt; nml bowel sounds; no organomegaly or masses detected.  Musco: Warm bil, no deformities or joint swelling noted.   Neuro: alert, no focal deficits noted.    Skin: Warm, no lesions or rashes  Assessment & Plan:   COPD (chronic obstructive pulmonary disease)Gold C Evaluate for nocturnal volume ventilation with elevated CO2 No evident OSA on sleep study but elevated pCO2 on sleep study Copd Gold C Plan An overnight sleep oxygen test will obtained on 2 liters oxygen An arterial blood gas will be obtained     Updated Medication List Outpatient Encounter Prescriptions as of 07/30/2013  Medication Sig  . albuterol (PROVENTIL HFA;VENTOLIN HFA) 108 (90 BASE) MCG/ACT inhaler Inhale 2 puffs into the lungs every 6 (six) hours as needed for wheezing.   Marland Kitchen aspirin 325 MG tablet Take 325 mg by mouth every 4 (four) hours as needed (Pain).  . baclofen (LIORESAL) 10 MG tablet Take 1 tablet by mouth 3 (three) times daily as needed for muscle spasms.   Marland Kitchen buPROPion  (WELLBUTRIN XL) 150 MG 24 hr tablet Take 150 mg by mouth every morning.  . citalopram (CELEXA) 20 MG tablet Take 20 mg by mouth at bedtime.   . diclofenac sodium (VOLTAREN) 1 % GEL Apply 1 application topically 4 (four) times daily. Applied to knees, shoulders, wrists, and ankles  . furosemide (LASIX) 40 MG tablet Take 1 tablet (40 mg total) by mouth 2 (two) times daily.  Marland Kitchen HYDROcodone-acetaminophen (NORCO/VICODIN) 5-325 MG per tablet Take 1 tablet by mouth every 4 (four) hours as needed (Pain).   . meloxicam (MOBIC) 15 MG tablet Take 15 mg by mouth daily.  . metoprolol tartrate (LOPRESSOR) 25 MG tablet Take 1 tablet by mouth 2 (two) times daily.  . MOMETASONE FUROATE EX Apply 1 application topically as needed (For breakouts on legs).   . mometasone-formoterol (DULERA) 200-5 MCG/ACT AERO Inhale 2 puffs into the lungs 2 (two) times daily.  . potassium chloride SA (K-DUR,KLOR-CON) 20 MEQ tablet Take 1 tablet (20 mEq total) by mouth 2 (two) times daily.  Marland Kitchen tiotropium (SPIRIVA) 18 MCG inhalation capsule Place 18 mcg into inhaler and inhale daily.  . verapamil (CALAN) 120 MG tablet Take 120 mg by mouth 2 (two) times daily.  . WELLBUTRIN XL 150 MG 24 hr tablet Take 1 tablet by mouth every morning.

## 2013-07-31 ENCOUNTER — Ambulatory Visit: Admission: RE | Admit: 2013-07-31 | Payer: Medicare Other | Source: Ambulatory Visit | Admitting: Critical Care Medicine

## 2013-07-31 ENCOUNTER — Other Ambulatory Visit: Payer: Self-pay | Admitting: Critical Care Medicine

## 2013-07-31 LAB — BLOOD GAS, ARTERIAL
Acid-Base Excess: 3.1 mmol/L — ABNORMAL HIGH (ref 0.0–2.0)
Bicarbonate: 27.7 mEq/L — ABNORMAL HIGH (ref 20.0–24.0)
Drawn by: 10370
FIO2: 0.21 %
O2 Saturation: 96.4 %
Patient temperature: 98.6
TCO2: 24.7 mmol/L (ref 0–100)
pCO2 arterial: 44.3 mmHg (ref 35.0–45.0)
pH, Arterial: 7.412 (ref 7.350–7.450)
pO2, Arterial: 81.6 mmHg (ref 80.0–100.0)

## 2013-07-31 NOTE — Assessment & Plan Note (Signed)
Evaluate for nocturnal volume ventilation with elevated CO2 No evident OSA on sleep study but elevated pCO2 on sleep study Copd Gold C Plan An overnight sleep oxygen test will obtained on 2 liters oxygen An arterial blood gas will be obtained

## 2013-08-02 ENCOUNTER — Other Ambulatory Visit: Payer: Self-pay | Admitting: Cardiology

## 2013-08-03 ENCOUNTER — Encounter: Payer: Self-pay | Admitting: Critical Care Medicine

## 2013-08-04 ENCOUNTER — Telehealth: Payer: Self-pay | Admitting: Critical Care Medicine

## 2013-08-04 ENCOUNTER — Other Ambulatory Visit: Payer: Self-pay | Admitting: Cardiology

## 2013-08-04 NOTE — Telephone Encounter (Signed)
See if Morgan Odonnell is covered Give samples until find out

## 2013-08-04 NOTE — Telephone Encounter (Signed)
lmomtcb x 1  To make the pt aware of PW recs.

## 2013-08-04 NOTE — Telephone Encounter (Signed)
Pt called back and she stated that she called the pharmacy since she was having a hard time getting the spiriva filled.  Pt stated that they needed her dx and directions for the medication.  She gave this to them and they told her that it would be up to 24 hours before they knew anything.    i called the pharmacy and they stated that this medication is now not on her drug formulary and they stated that there is no number to call for a PA but you can do an appeal for this medication.  PW please advise of what we can do for the pt?  Should we change this medication?  Thanks  Allergies  Allergen Reactions  . Gabapentin     "slept for 2 days"     Current Outpatient Prescriptions on File Prior to Visit  Medication Sig Dispense Refill  . albuterol (PROVENTIL HFA;VENTOLIN HFA) 108 (90 BASE) MCG/ACT inhaler Inhale 2 puffs into the lungs every 6 (six) hours as needed for wheezing.       Marland Kitchen aspirin 325 MG tablet Take 325 mg by mouth every 4 (four) hours as needed (Pain).      . baclofen (LIORESAL) 10 MG tablet Take 1 tablet by mouth 3 (three) times daily as needed for muscle spasms.       Marland Kitchen buPROPion (WELLBUTRIN XL) 150 MG 24 hr tablet Take 150 mg by mouth every morning.      . citalopram (CELEXA) 20 MG tablet Take 20 mg by mouth at bedtime.       . diclofenac sodium (VOLTAREN) 1 % GEL Apply 1 application topically 4 (four) times daily. Applied to knees, shoulders, wrists, and ankles      . furosemide (LASIX) 40 MG tablet Take 1 tablet (40 mg total) by mouth 2 (two) times daily.  60 tablet  6  . HYDROcodone-acetaminophen (NORCO/VICODIN) 5-325 MG per tablet Take 1 tablet by mouth every 4 (four) hours as needed (Pain).       Marland Kitchen KLOR-CON M20 20 MEQ tablet TAKE 1 TABLET (20 MEQ TOTAL) BY MOUTH 2 (TWO) TIMES DAILY.  60 tablet  0  . meloxicam (MOBIC) 15 MG tablet Take 15 mg by mouth daily.      . metoprolol tartrate (LOPRESSOR) 25 MG tablet Take 1 tablet by mouth 2 (two) times daily.      . MOMETASONE FUROATE  EX Apply 1 application topically as needed (For breakouts on legs).       . mometasone-formoterol (DULERA) 200-5 MCG/ACT AERO Inhale 2 puffs into the lungs 2 (two) times daily.  1 Inhaler  5  . tiotropium (SPIRIVA) 18 MCG inhalation capsule Place 18 mcg into inhaler and inhale daily.      . verapamil (CALAN) 120 MG tablet Take 120 mg by mouth 2 (two) times daily.      . WELLBUTRIN XL 150 MG 24 hr tablet Take 1 tablet by mouth every morning.       No current facility-administered medications on file prior to visit.

## 2013-08-04 NOTE — Telephone Encounter (Signed)
[name] w/ [facility] [phone], pt ID# [phone].  Or can call customer service at [phone].  Roby Lofts is calling regarding PA for spiriva.   Morgan Odonnell

## 2013-08-04 NOTE — Telephone Encounter (Signed)
lmomtcb x 1  To discuss her medications with her.

## 2013-08-05 MED ORDER — ACLIDINIUM BROMIDE 400 MCG/ACT IN AEPB: 1.0000 | INHALATION_SPRAY | Freq: Two times a day (BID) | RESPIRATORY_TRACT | Status: DC

## 2013-08-05 NOTE — Telephone Encounter (Signed)
Spoke with [facility] [place] is covered 30-day supply only--$3.60 REF # [id number]    Dr Joya Gaskins please advise on the dosing for this medication. Thanks.

## 2013-08-05 NOTE — Telephone Encounter (Signed)
1 puff twice daily.

## 2013-08-05 NOTE — Telephone Encounter (Signed)
[email or web address]. Nikoletta Varma, CMA  

## 2013-08-05 NOTE — Telephone Encounter (Signed)
Spoke with pt and advised of instructions for [place].  Rx sent to pharmacy

## 2013-08-06 ENCOUNTER — Telehealth: Payer: Self-pay | Admitting: Critical Care Medicine

## 2013-08-06 DIAGNOSIS — J9612 Chronic respiratory failure with hypercapnia: Secondary | ICD-10-CM

## 2013-08-06 NOTE — Telephone Encounter (Signed)
Pt is aware of results. Nothing further was needed. 

## 2013-08-06 NOTE — Telephone Encounter (Signed)
Call pt and tell her ONO on oxygen NORMAL  Stay on 2L at bedtime No other device at night needed

## 2013-08-25 ENCOUNTER — Telehealth: Payer: Self-pay | Admitting: Critical Care Medicine

## 2013-08-25 NOTE — Telephone Encounter (Signed)
Stop tudorza  Will need ov to regroup

## 2013-08-25 NOTE — Telephone Encounter (Signed)
Called spoke with pt. Aware of recs. appt scheduled to see TP tomorrow at 3 pm in [place] office.

## 2013-08-25 NOTE — Telephone Encounter (Signed)
Called spoke with pt. She reports every since she started the tudorza she has been feeling very tired, sleeping a lot and waking up with sore throat. She reports she was not feeling like this prior to [place]. She is aware Dr. Joya Gaskins is not in this afternoon and fine with a call back tomorrow. Please advise thanks  Allergies  Allergen Reactions  . Gabapentin     "slept for 2 days"

## 2013-08-26 ENCOUNTER — Other Ambulatory Visit (INDEPENDENT_AMBULATORY_CARE_PROVIDER_SITE_OTHER): Payer: Medicare Other

## 2013-08-26 ENCOUNTER — Encounter (INDEPENDENT_AMBULATORY_CARE_PROVIDER_SITE_OTHER): Payer: Self-pay

## 2013-08-26 ENCOUNTER — Ambulatory Visit (INDEPENDENT_AMBULATORY_CARE_PROVIDER_SITE_OTHER): Payer: Medicare Other | Admitting: Adult Health

## 2013-08-26 ENCOUNTER — Encounter: Payer: Self-pay | Admitting: Adult Health

## 2013-08-26 ENCOUNTER — Ambulatory Visit (INDEPENDENT_AMBULATORY_CARE_PROVIDER_SITE_OTHER)
Admission: RE | Admit: 2013-08-26 | Discharge: 2013-08-26 | Disposition: A | Discharge status: Home / Self Care | Payer: Medicare Other | Source: Ambulatory Visit | Attending: Adult Health | Admitting: Adult Health

## 2013-08-26 VITALS — BP 116/68 | HR 73 | Temp 98.3°F | Ht 62.0 in | Wt 342.0 lb

## 2013-08-26 DIAGNOSIS — R0989 Other specified symptoms and signs involving the circulatory and respiratory systems: Secondary | ICD-10-CM | POA: Diagnosis not present

## 2013-08-26 DIAGNOSIS — R0609 Other forms of dyspnea: Secondary | ICD-10-CM

## 2013-08-26 DIAGNOSIS — R0600 Dyspnea, unspecified: Secondary | ICD-10-CM

## 2013-08-26 DIAGNOSIS — J441 Chronic obstructive pulmonary disease with (acute) exacerbation: Secondary | ICD-10-CM

## 2013-08-26 DIAGNOSIS — I509 Heart failure, unspecified: Secondary | ICD-10-CM | POA: Diagnosis not present

## 2013-08-26 LAB — BASIC METABOLIC PANEL
BUN: 16 mg/dL (ref 6–23)
CO2: 32 mEq/L (ref 19–32)
Calcium: 9.4 mg/dL (ref 8.4–10.5)
Chloride: 97 mEq/L (ref 96–112)
Creatinine, Ser: 1 mg/dL (ref 0.4–1.2)
GFR: 57.85 mL/min — ABNORMAL LOW (ref >60.00)
Glucose, Bld: 107 mg/dL — ABNORMAL HIGH (ref 70–99)
Potassium: 4.5 mEq/L (ref 3.5–5.1)
Sodium: 137 mEq/L (ref 135–145)

## 2013-08-26 LAB — BRAIN NATRIURETIC PEPTIDE: Pro B Natriuretic peptide (BNP): 112 pg/mL — ABNORMAL HIGH (ref 0.0–100.0)

## 2013-08-26 MED ORDER — TIOTROPIUM BROMIDE MONOHYDRATE 18 MCG IN CAPS: 18.0000 ug | ORAL_CAPSULE | Freq: Every day | RESPIRATORY_TRACT | Status: DC

## 2013-08-26 NOTE — Patient Instructions (Signed)
Begin Oxygen 2l/m with walking  May stop [place] .  Restart Spiriva 1 puff daily -we will check with insurance to see if they will cover this since you failed [place].  Labs and chest xray today follow up Dr. Joya Gaskins  In 2-3 weeks and As needed   Please contact office for sooner follow up if symptoms do not improve or worsen or seek emergency care

## 2013-08-26 NOTE — Progress Notes (Signed)
Subjective:    Patient ID: Morgan Odonnell, female    DOB: 12-Sep-1950, 63 y.o.   MRN: [id number]  HPI  63 yo with hx of COPD  PFTs 06/15/2012: FeV1 57%  FeV1/FVC 70%  Fef 27 75 25% TLC 66%  DLCO 52%    Hypercarbic RF on nocturnal O2 at 2l/m  Sleep study 05/2012 , no OSA, +desats .    01/26/2013 Chief Complaint  Patient presents with  . 6 wk follow up    Not much difference in breathing since last OV with TP.  Still gets SOB with minimal activity and wheezing.  No cough.  O2 needs to be re-qualified.    Now on dulera.  Uses oxygen at night .  No real cough. Dyspnea the same.  occ wheeze. Pt denies any significant sore throat, nasal congestion or excess secretions, fever, chills, sweats, unintended weight loss, pleurtic or exertional chest pain, orthopnea PND, or leg swelling Pt denies any increase in rescue therapy over baseline, denies waking up needing it or having any early am or nocturnal exacerbations of coughing/wheezing/or dyspnea. Pt also denies any obvious fluctuation in symptoms with  weather or environmental change or other alleviating or aggravating factors  Now on bactrim for cellulitis, PCP rx.  Dx lymphedema.    07/30/2013 Chief Complaint  Patient presents with  . COPD    Breathing is unchanged. Reports SOB, chest tightness, coughing. Denies wheezing at this time.  Since last ov about the same.  Now awakens up 3am with a headache and dizzy when gets up.  Difficulty catching balance.   08/26/2013 Acute OV  Complains of  increased fatigue/sleepiness, sore throat, runny nose, increased SOB, wheezing and tightness attributed to [place].  Feels the Caprice Renshaw made her worse.  Did better on spiriva, but insurance would not cover.  Denies purulent sputum, f/c/s, nausea, vomiting.  Was seen last month with ABG , no sign hypercarbia with PCO2 at 44. She was cont on nocturnal O2.  Legs have chronic edema, no sign increase. Or wt game.  No chest pain, calf pain , hemoptysis.     Review of Systems  Constitutional:   No  weight loss, night sweats,  Fevers, chills,  +fatigue, or  lassitude.  HEENT:   No headaches,  Difficulty swallowing,  Tooth/dental problems, or  Sore throat,                No sneezing, itching, ear ache,  +nasal congestion, post nasal drip,   CV:  No chest pain,  Orthopnea, PND,  anasarca, dizziness, palpitations, syncope.   GI  No heartburn, indigestion, abdominal pain, nausea, vomiting, diarrhea, change in bowel habits, loss of appetite, bloody stools.   Resp:.  No chest wall deformity  Skin: no rash or lesions.  GU: no dysuria, change in color of urine, no urgency or frequency.  No flank pain, no hematuria   MS:  +chronic back pain.  Psych:  No change in mood or affect. No depression or anxiety.  No memory loss.      Objective:   Physical Exam  BP 116/68  Pulse 73  Temp(Src) 98.3 F (36.8 C) (Oral)  Ht 5\' 2"  (1.575 m)  Wt 342 lb (155.13 kg)  BMI 62.54 kg/m2  SpO2 91%  GEN: A/Ox3; pleasant , NAD, morbidly obese   HEENT:  [place]/AT,  EACs-clear, TMs-wnl, NOSE-clear, THROAT-clear, no lesions, no postnasal drip or exudate noted.   NECK:  Supple w/ fair ROM; no JVD; normal carotid impulses  w/o bruits; no thyromegaly or nodules palpated; no lymphadenopathy.  RESP  Diminished BS in bases .no accessory muscle use, no dullness to percussion  CARD:  RRR, no m/r/g  , 1-2 +peripheral edema, pulses intact, no  clubbing.  GI:   Soft & nt; nml bowel sounds; no organomegaly or masses detected.  Musco: Warm bil, no deformities or joint swelling noted.   Neuro: alert, no focal deficits noted.    Skin: Warm, no lesions or rashes        Assessment & Plan:   COPD (chronic obstructive pulmonary disease)Gold C Mild flare ? Fluid overload  Check labs and cxr  Now with exertional desats  ? tudorza intolerance   Plan  Begin Oxygen 2l/m with walking  May stop [place] .  Restart Spiriva 1 puff daily -we will check with  insurance to see if they will cover this since you failed [place].  Labs and chest xray today follow up Dr. Joya Gaskins  In 2-3 weeks and As needed   Please contact office for sooner follow up if symptoms do not improve or worsen or seek emergency care       Updated Medication List Outpatient Encounter Prescriptions as of 08/26/2013  Medication Sig  . albuterol (PROVENTIL HFA;VENTOLIN HFA) 108 (90 BASE) MCG/ACT inhaler Inhale 2 puffs into the lungs every 6 (six) hours as needed for wheezing.   Marland Kitchen aspirin 325 MG tablet Take 325 mg by mouth every 4 (four) hours as needed (Pain).  . baclofen (LIORESAL) 10 MG tablet Take 1 tablet by mouth 3 (three) times daily as needed for muscle spasms.   . citalopram (CELEXA) 20 MG tablet Take 20 mg by mouth daily.   . diclofenac sodium (VOLTAREN) 1 % GEL Apply 1 application topically 4 (four) times daily. Applied to knees, shoulders, wrists, and ankles  . furosemide (LASIX) 40 MG tablet TAKE 1 TABLET (40 MG TOTAL) BY MOUTH 2 (TWO) TIMES DAILY.  Marland Kitchen HYDROcodone-acetaminophen (NORCO/VICODIN) 5-325 MG per tablet Take 1 tablet by mouth every 4 (four) hours as needed (Pain).   Marland Kitchen KLOR-CON M20 20 MEQ tablet TAKE 1 TABLET (20 MEQ TOTAL) BY MOUTH 2 (TWO) TIMES DAILY.  . meloxicam (MOBIC) 15 MG tablet Take 15 mg by mouth daily.  . metoprolol tartrate (LOPRESSOR) 25 MG tablet Take 1 tablet by mouth 2 (two) times daily.  . MOMETASONE FUROATE EX Apply 1 application topically as needed (For breakouts on legs).   . mometasone-formoterol (DULERA) 200-5 MCG/ACT AERO Inhale 2 puffs into the lungs 2 (two) times daily.  . verapamil (CALAN) 120 MG tablet Take 120 mg by mouth 2 (two) times daily.  Marland Kitchen buPROPion (WELLBUTRIN XL) 150 MG 24 hr tablet Take 150 mg by mouth every morning.  . tiotropium (SPIRIVA) 18 MCG inhalation capsule Place 1 capsule (18 mcg total) into inhaler and inhale daily.  . WELLBUTRIN XL 150 MG 24 hr tablet Take 1 tablet by mouth every morning.  . [DISCONTINUED]  Aclidinium Bromide 400 MCG/ACT AEPB Inhale 1 puff into the lungs 2 (two) times daily.

## 2013-08-26 NOTE — Assessment & Plan Note (Signed)
Mild flare ? Fluid overload  Check labs and cxr  Now with exertional desats  ? tudorza intolerance   Plan  Begin Oxygen 2l/m with walking  May stop [place] .  Restart Spiriva 1 puff daily -we will check with insurance to see if they will cover this since you failed [place].  Labs and chest xray today follow up Dr. Joya Gaskins  In 2-3 weeks and As needed   Please contact office for sooner follow up if symptoms do not improve or worsen or seek emergency care

## 2013-08-27 ENCOUNTER — Telehealth: Payer: Self-pay | Admitting: Adult Health

## 2013-08-27 ENCOUNTER — Ambulatory Visit (INDEPENDENT_AMBULATORY_CARE_PROVIDER_SITE_OTHER): Payer: Medicare Other | Admitting: Internal Medicine

## 2013-08-27 ENCOUNTER — Encounter: Payer: Self-pay | Admitting: Internal Medicine

## 2013-08-27 VITALS — BP 118/74 | HR 72 | Ht 62.0 in | Wt 342.0 lb

## 2013-08-27 DIAGNOSIS — IMO0002 Reserved for concepts with insufficient information to code with codable children: Secondary | ICD-10-CM

## 2013-08-27 DIAGNOSIS — S80812A Abrasion, left lower leg, initial encounter: Secondary | ICD-10-CM

## 2013-08-27 DIAGNOSIS — I890 Lymphedema, not elsewhere classified: Secondary | ICD-10-CM

## 2013-08-27 MED ORDER — DOXYCYCLINE HYCLATE 100 MG PO TABS: ORAL_TABLET | ORAL | Status: DC

## 2013-08-27 NOTE — Patient Instructions (Addendum)
Elevate legs  Wash left lower leg wound gently with soap and water, then pat dry. You can use neosporin ointment on the wound.  Ok to take your lasix 40 mg tabs, 3 daily for the next week or so, then see if you can drop back to twice daily as before. Discuss long term dosing with Dr Joya Gaskins  Script for doxycycline antibiotic sent

## 2013-08-27 NOTE — Progress Notes (Signed)
Subjective:    Patient ID: Morgan Odonnell, female    DOB: Jun 03, 1950, 63 y.o.   MRN: [id number]  HPI  63 yo with hx of COPD  PFTs 06/15/2012: FeV1 57%  FeV1/FVC 70%  Fef 27 75 25% TLC 66%  DLCO 52%    Hypercarbic RF on nocturnal O2 at 2l/m  Sleep study 05/2012 , no OSA, +desats .    01/26/2013 Chief Complaint  Patient presents with  . 6 wk follow up    Not much difference in breathing since last OV with TP.  Still gets SOB with minimal activity and wheezing.  No cough.  O2 needs to be re-qualified.    Now on dulera.  Uses oxygen at night .  No real cough. Dyspnea the same.  occ wheeze. Pt denies any significant sore throat, nasal congestion or excess secretions, fever, chills, sweats, unintended weight loss, pleurtic or exertional chest pain, orthopnea PND, or leg swelling Pt denies any increase in rescue therapy over baseline, denies waking up needing it or having any early am or nocturnal exacerbations of coughing/wheezing/or dyspnea. Pt also denies any obvious fluctuation in symptoms with  weather or environmental change or other alleviating or aggravating factors  Now on bactrim for cellulitis, PCP rx.  Dx lymphedema.    07/30/2013 Chief Complaint  Patient presents with  . COPD    Breathing is unchanged. Reports SOB, chest tightness, coughing. Denies wheezing at this time.  Since last ov about the same.  Now awakens up 3am with a headache and dizzy when gets up.  Difficulty catching balance.   08/26/2013 Acute OV  Complains of  increased fatigue/sleepiness, sore throat, runny nose, increased SOB, wheezing and tightness attributed to [place].  Feels the Caprice Renshaw made her worse.  Did better on spiriva, but insurance would not cover.  Denies purulent sputum, f/c/s, nausea, vomiting.  Was seen last month with ABG , no sign hypercarbia with PCO2 at 44. She was cont on nocturnal O2.  Legs have chronic edema, no sign increase. Or wt game.  No chest pain, calf pain , hemoptysis.   COPD (chronic obstructive pulmonary disease)Gold C Mild flare ? Fluid overload  Check labs and cxr  Now with exertional desats  ? tudorza intolerance  Plan  Begin Oxygen 2l/m with walking  May stop [place] .  Restart Spiriva 1 puff daily -we will check with insurance to see if they will cover this since you failed [place].  Labs and chest xray today follow up Dr. Joya Gaskins  In 2-3 weeks and As needed   Please contact office for sooner follow up if symptoms do not improve or worsen or seek emergency care   08/27/13- 63 yoF followed by Dr Joya Gaskins for COPD/ [place] complicated by morbid obesity, diastolic CHF ACUTE VISIT:  Still having sob, wheezing and open edema in (L) leg. Was seen yesterday by NP and to take lasix 3 x 40 mg daily. When she got script filled, it gave # 90, but sig "1 daily". She was confused. Legs badly swollen. She hit l lower leg yesterday and torn area L pretibial has been oozing. Has been Rx'd in past w leg wraps by Dr Sharol Given. She is afrais of MRSA. Labs 6/18- BMET ok, BNP 112.  ROS-see HPI Constitutional:   No-   weight loss, night sweats, fevers, chills, fatigue, lassitude. HEENT:   No-  headaches, difficulty swallowing, tooth/dental problems, sore throat,       No-  sneezing, itching, ear ache, nasal  congestion, post nasal drip,  CV:  No-   chest pain, orthopnea, PND, +swelling in lower extremities, anasarca,                                  dizziness, palpitations Resp: +shortness of breath with exertion or at rest.              No-   productive cough,  No non-productive cough,  No- coughing up of blood.              No-   change in color of mucus.  No- wheezing.   Skin: No-   rash or lesions. GI:  No-   heartburn, indigestion, abdominal pain, nausea, vomiting, GU:  MS:  No-   joint pain or swelling.  . Neuro-     nothing unusual Psych:  No- change in mood or affect. No depression or anxiety.  No memory loss.  OBJ- Physical Exam General- Alert, Oriented,  Affect-appropriate, Distress- none acute, +morbidly obese Skin- +1.5 cm superficial abraded areal L pretibial, clean base, clear fluid. Multiple small skin                   vesicles both lower legs Lymphadenopathy- none Head- atraumatic            Eyes- Gross vision intact, PERRLA, conjunctivae and secretions clear            Ears- Hearing, canals-normal            Nose- Clear, no-Septal dev, mucus, polyps, erosion, perforation , dusky            Throat- Mallampati II , mucosa clear , drainage- none, tonsils- atrophic Neck- flexible , trachea midline, no stridor , thyroid nl, carotid no bruit Chest - symmetrical excursion , unlabored           Heart/CV- RRR , no murmur , no gallop  , no rub, nl s1 s2                           - JVD- none , edema+4, stasis changes- none, varices- none           Lung- clear to P&A, wheeze- none, cough- none , dullness-none, rub- none           Chest wall-  Abd- Br/ Gen/ Rectal- Not done, not indicated Extrem- cyanosis- none, clubbing, none, atrophy- none, strength- nl Neuro- grossly intact to observation            Assessment & Plan:

## 2013-08-27 NOTE — Assessment & Plan Note (Signed)
This area looks clean for now but very high risk for secondary infection. There is poor circulation and chronic stasis edema likely from peripheral venous insufficiency +/- cor pulmonale Plan-gentle soap and water, Neosporin, elevate leg, doxycycline, clarify instructions: Lasix 3 tablets daily for one week then try returning to usual 2 tablets daily. Followup with Dr. Joya Gaskins for management

## 2013-08-27 NOTE — Telephone Encounter (Signed)
Called spoke with pt. She is scheduled to come in and see cdy today for leg leaking from swelling. Nothing further needed

## 2013-08-27 NOTE — Assessment & Plan Note (Signed)
Plan-Lasix 3x40 mg daily x7 days then return to 2x40 mg daily

## 2013-08-27 NOTE — Assessment & Plan Note (Signed)
controlled 

## 2013-08-31 ENCOUNTER — Telehealth: Payer: Self-pay | Admitting: Adult Health

## 2013-08-31 NOTE — Telephone Encounter (Signed)
Received PA for Spiriva Handihaler for [employer]. Called [facility] at [phone] and was only given Caprice Renshaw as an alternative. Pt was seen by TP on 08/26/2013 in which TP stated pt has tried and failed [place]. PA could not be initiated over the phone, PA form has been faxed to front fax. Will forward to Jess to make aware.   OV Notes 08/26/2013: Begin Oxygen 2l/m with walking  May stop [place] .  Restart Spiriva 1 puff daily -we will check with insurance to see if they will cover this since you failed [place].  Labs and chest xray today  follow up Dr. Joya Gaskins In 2-3 weeks and As needed  Please contact office for sooner follow up if symptoms do not improve or worsen or seek emergency care

## 2013-09-01 NOTE — Telephone Encounter (Signed)
PA form filled out and signed by TP Faxed back to [facility] Will await decision

## 2013-09-07 ENCOUNTER — Other Ambulatory Visit: Payer: Self-pay | Admitting: Internal Medicine

## 2013-09-07 ENCOUNTER — Other Ambulatory Visit: Payer: Self-pay | Admitting: Adult Health

## 2013-09-07 ENCOUNTER — Other Ambulatory Visit: Payer: Self-pay | Admitting: Cardiology

## 2013-09-07 DIAGNOSIS — J449 Chronic obstructive pulmonary disease, unspecified: Secondary | ICD-10-CM | POA: Diagnosis not present

## 2013-09-07 DIAGNOSIS — L089 Local infection of the skin and subcutaneous tissue, unspecified: Secondary | ICD-10-CM | POA: Diagnosis not present

## 2013-09-07 DIAGNOSIS — I10 Essential (primary) hypertension: Secondary | ICD-10-CM | POA: Diagnosis not present

## 2013-09-07 DIAGNOSIS — Z6841 Body Mass Index (BMI) 40.0 and over, adult: Secondary | ICD-10-CM | POA: Diagnosis not present

## 2013-09-07 DIAGNOSIS — Z1231 Encounter for screening mammogram for malignant neoplasm of breast: Secondary | ICD-10-CM

## 2013-09-07 DIAGNOSIS — I872 Venous insufficiency (chronic) (peripheral): Secondary | ICD-10-CM | POA: Diagnosis not present

## 2013-09-15 ENCOUNTER — Ambulatory Visit
Admission: RE | Admit: 2013-09-15 | Discharge: 2013-09-15 | Disposition: A | Discharge status: Home / Self Care | Payer: Medicare Other | Source: Ambulatory Visit | Attending: Internal Medicine | Admitting: Internal Medicine

## 2013-09-15 DIAGNOSIS — Z1231 Encounter for screening mammogram for malignant neoplasm of breast: Secondary | ICD-10-CM | POA: Diagnosis not present

## 2013-09-15 NOTE — Telephone Encounter (Signed)
Received fax from [place] has been approved beginning 5.29.15 until further notice #30 per month  Called [facility] and spoke with pharmacist Lattie Haw, they have been notified of the approval as well.  Nothing further needed; will sign off.

## 2013-09-17 ENCOUNTER — Encounter (HOSPITAL_BASED_OUTPATIENT_CLINIC_OR_DEPARTMENT_OTHER): Payer: Medicare Other | Attending: General Surgery

## 2013-09-17 ENCOUNTER — Other Ambulatory Visit: Payer: Self-pay | Admitting: Internal Medicine

## 2013-09-17 ENCOUNTER — Ambulatory Visit (HOSPITAL_BASED_OUTPATIENT_CLINIC_OR_DEPARTMENT_OTHER): Payer: Medicare Other

## 2013-09-17 DIAGNOSIS — Z79899 Other long term (current) drug therapy: Secondary | ICD-10-CM | POA: Insufficient documentation

## 2013-09-17 DIAGNOSIS — E669 Obesity, unspecified: Secondary | ICD-10-CM | POA: Diagnosis not present

## 2013-09-17 DIAGNOSIS — J4489 Other specified chronic obstructive pulmonary disease: Secondary | ICD-10-CM | POA: Diagnosis not present

## 2013-09-17 DIAGNOSIS — M129 Arthropathy, unspecified: Secondary | ICD-10-CM | POA: Diagnosis not present

## 2013-09-17 DIAGNOSIS — J449 Chronic obstructive pulmonary disease, unspecified: Secondary | ICD-10-CM | POA: Diagnosis not present

## 2013-09-17 DIAGNOSIS — I10 Essential (primary) hypertension: Secondary | ICD-10-CM | POA: Diagnosis not present

## 2013-09-17 DIAGNOSIS — I2510 Atherosclerotic heart disease of native coronary artery without angina pectoris: Secondary | ICD-10-CM | POA: Diagnosis not present

## 2013-09-17 DIAGNOSIS — I890 Lymphedema, not elsewhere classified: Secondary | ICD-10-CM | POA: Insufficient documentation

## 2013-09-17 DIAGNOSIS — L97809 Non-pressure chronic ulcer of other part of unspecified lower leg with unspecified severity: Secondary | ICD-10-CM | POA: Diagnosis not present

## 2013-09-17 DIAGNOSIS — R928 Other abnormal and inconclusive findings on diagnostic imaging of breast: Secondary | ICD-10-CM

## 2013-09-17 DIAGNOSIS — I872 Venous insufficiency (chronic) (peripheral): Secondary | ICD-10-CM | POA: Insufficient documentation

## 2013-09-18 NOTE — Progress Notes (Signed)
[facility]  NAME:  Morgan Odonnell, SCOGGIN NO.:  [id number]  MEDICAL RECORD NO.:  [id number]      DATE OF BIRTH:  1951/01/16  PHYSICIAN:  Judene Companion, M.D.      VISIT DATE:  09/17/2013                                  OFFICE VISIT   A 63 year old morbidly obese, Caucasian woman who comes to [name] with superficial venous ulcers on the anterior aspect of her left leg.  She states they have been present for several weeks.  She has a long history of obesity, hypertension, heart disease, arthritis, also she has had knee operations on both of her knees.  This lady weighs 355 pounds.  She is 5 feet 2 inches.  Her blood pressure is 120/60, pulse 65, temperature 98.  Her medicines include verapamil, Spiriva, K-Lor, Dulera, Lopressor, Mobic, Norco, Lasix, Voltaren, citalopram, and Proventil 2 puffs every 6 hours.  Her other diagnoses besides hypertension, obesity, and heart disease are severe arthritis and COPD.  These ulcers on the anterior aspect of her left leg are very superficial and we treated them with the Unna boots, and we put some silver alginate on them.  She has marked lymphedema, and I think she would benefit by having lymphedema pumps, and we will see about getting her some of those.  DIAGNOSES:  Venous stasis, bilateral lymphedema, obesity, hypertension, coronary artery disease, arthritis, multiple knee operations.     Judene Companion, M.D.     PP/MEDQ  D:  09/17/2013  T:  09/18/2013  Job:  [id number]

## 2013-09-24 DIAGNOSIS — E669 Obesity, unspecified: Secondary | ICD-10-CM | POA: Diagnosis not present

## 2013-09-24 DIAGNOSIS — I2510 Atherosclerotic heart disease of native coronary artery without angina pectoris: Secondary | ICD-10-CM | POA: Diagnosis not present

## 2013-09-24 DIAGNOSIS — I872 Venous insufficiency (chronic) (peripheral): Secondary | ICD-10-CM | POA: Diagnosis not present

## 2013-09-24 DIAGNOSIS — L97809 Non-pressure chronic ulcer of other part of unspecified lower leg with unspecified severity: Secondary | ICD-10-CM | POA: Diagnosis not present

## 2013-09-24 DIAGNOSIS — I10 Essential (primary) hypertension: Secondary | ICD-10-CM | POA: Diagnosis not present

## 2013-09-24 DIAGNOSIS — I890 Lymphedema, not elsewhere classified: Secondary | ICD-10-CM | POA: Diagnosis not present

## 2013-09-27 ENCOUNTER — Ambulatory Visit
Admission: RE | Admit: 2013-09-27 | Discharge: 2013-09-27 | Disposition: A | Discharge status: Home / Self Care | Payer: Medicare Other | Source: Ambulatory Visit | Attending: Internal Medicine | Admitting: Internal Medicine

## 2013-09-27 DIAGNOSIS — R928 Other abnormal and inconclusive findings on diagnostic imaging of breast: Secondary | ICD-10-CM

## 2013-09-30 ENCOUNTER — Encounter (HOSPITAL_BASED_OUTPATIENT_CLINIC_OR_DEPARTMENT_OTHER): Payer: Medicare Other

## 2013-10-01 DIAGNOSIS — I10 Essential (primary) hypertension: Secondary | ICD-10-CM | POA: Diagnosis not present

## 2013-10-01 DIAGNOSIS — I890 Lymphedema, not elsewhere classified: Secondary | ICD-10-CM | POA: Diagnosis not present

## 2013-10-01 DIAGNOSIS — E669 Obesity, unspecified: Secondary | ICD-10-CM | POA: Diagnosis not present

## 2013-10-01 DIAGNOSIS — I872 Venous insufficiency (chronic) (peripheral): Secondary | ICD-10-CM | POA: Diagnosis not present

## 2013-10-01 DIAGNOSIS — I2510 Atherosclerotic heart disease of native coronary artery without angina pectoris: Secondary | ICD-10-CM | POA: Diagnosis not present

## 2013-10-01 DIAGNOSIS — L97809 Non-pressure chronic ulcer of other part of unspecified lower leg with unspecified severity: Secondary | ICD-10-CM | POA: Diagnosis not present

## 2013-10-08 ENCOUNTER — Other Ambulatory Visit: Payer: Self-pay | Admitting: Cardiology

## 2013-10-08 ENCOUNTER — Other Ambulatory Visit: Payer: Self-pay | Admitting: Adult Health

## 2013-10-11 ENCOUNTER — Ambulatory Visit: Payer: Medicare Other | Admitting: Critical Care Medicine

## 2013-10-12 ENCOUNTER — Encounter: Payer: Self-pay | Admitting: Critical Care Medicine

## 2013-10-15 ENCOUNTER — Encounter (HOSPITAL_BASED_OUTPATIENT_CLINIC_OR_DEPARTMENT_OTHER): Payer: Medicare Other | Attending: General Surgery

## 2013-10-15 DIAGNOSIS — L97909 Non-pressure chronic ulcer of unspecified part of unspecified lower leg with unspecified severity: Principal | ICD-10-CM

## 2013-10-15 DIAGNOSIS — I87339 Chronic venous hypertension (idiopathic) with ulcer and inflammation of unspecified lower extremity: Secondary | ICD-10-CM | POA: Insufficient documentation

## 2013-10-15 DIAGNOSIS — L97809 Non-pressure chronic ulcer of other part of unspecified lower leg with unspecified severity: Secondary | ICD-10-CM | POA: Insufficient documentation

## 2013-10-18 ENCOUNTER — Ambulatory Visit (INDEPENDENT_AMBULATORY_CARE_PROVIDER_SITE_OTHER): Payer: Medicare Other | Admitting: Critical Care Medicine

## 2013-10-18 ENCOUNTER — Encounter: Payer: Self-pay | Admitting: Critical Care Medicine

## 2013-10-18 VITALS — BP 122/74 | HR 68 | Temp 97.0°F | Ht 62.0 in | Wt 338.4 lb

## 2013-10-18 DIAGNOSIS — J432 Centrilobular emphysema: Secondary | ICD-10-CM

## 2013-10-18 DIAGNOSIS — Z6841 Body Mass Index (BMI) 40.0 and over, adult: Secondary | ICD-10-CM | POA: Diagnosis not present

## 2013-10-18 DIAGNOSIS — J9621 Acute and chronic respiratory failure with hypoxia: Secondary | ICD-10-CM

## 2013-10-18 DIAGNOSIS — J9620 Acute and chronic respiratory failure, unspecified whether with hypoxia or hypercapnia: Secondary | ICD-10-CM | POA: Diagnosis not present

## 2013-10-18 DIAGNOSIS — J438 Other emphysema: Secondary | ICD-10-CM | POA: Diagnosis not present

## 2013-10-18 NOTE — Assessment & Plan Note (Signed)
Morbid obesity Note the patient has attended several the bariatric surgery sessions Plan Will issue formal referral to bariatric surgery for consideration

## 2013-10-18 NOTE — Assessment & Plan Note (Signed)
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

## 2013-10-18 NOTE — Patient Instructions (Signed)
A referral to bariatric surgery will be made A referral to pulmonary rehab will be made No change in medications Return 3 months

## 2013-10-18 NOTE — Assessment & Plan Note (Signed)
Nocturnal chronic hypoxemia now on oxygen therapy 2 liters Plan Maintain 2 L oxygen at night

## 2013-10-18 NOTE — Progress Notes (Signed)
   Subjective:    Patient ID: Morgan Odonnell, female    DOB: 05/08/1950, 63 y.o.   MRN: [id number]  HPI 10/18/2013 Chief Complaint  Patient presents with  . Follow-up    Sharp, stabbing pain on right side with deep inhalations x 3 days.  Some chest tightness and prod cough with clear mucus.  No increased SOB.   overall the patient is improved. There is less shortness of breath. There is less cough. Patient has managed to lose 15 pounds. She has attended several bariatric educational sessions. She is interested in a formal referral to bariatric surgery for consideration. The patient maintains oxygen 2 L each bedtime for nocturnal hypoxemia. Note the patient has not had hypercarbia. Also the patient's sleep study was normal with regards to normal AHI.  There is no active smoking noted at this time.     Review of Systems Constitutional:   No  weight loss, night sweats,  Fevers, chills, fatigue, lassitude. HEENT:   No headaches,  Difficulty swallowing,  Tooth/dental problems,  Sore throat,                No sneezing, itching, ear ache, nasal congestion, post nasal drip,   CV:  No chest pain,  Orthopnea, PND, swelling in lower extremities, anasarca, dizziness, palpitations  GI  No heartburn, indigestion, abdominal pain, nausea, vomiting, diarrhea, change in bowel habits, loss of appetite  Resp: Notes   shortness of breath with exertion not at rest.  No excess mucus, no productive cough,  No non-productive cough,  No coughing up of blood.  No change in color of mucus.  No wheezing.  No chest wall deformity  Skin: no rash or lesions.  GU: no dysuria, change in color of urine, no urgency or frequency.  No flank pain.  MS:  No joint pain or swelling.  No decreased range of motion.  No back pain.  Psych:  No change in mood or affect. No depression or anxiety.  No memory loss.     Objective:   Physical Exam Filed Vitals:   10/18/13 0939  BP: 122/74  Pulse: 68  Temp: 97 F (36.1 C)   TempSrc: Oral  Height: 5\' 2"  (1.575 m)  Weight: 338 lb 6.4 oz (153.497 kg)  SpO2: 93%    Gen: morbidly obese   ENT: No lesions,  mouth clear,  oropharynx clear, no postnasal drip  Neck: No JVD, no TMG, no carotid bruits  Lungs: No use of accessory muscles, no dullness to percussion, distant BS  Cardiovascular: RRR, heart sounds normal, no murmur or gallops, no peripheral edema  Abdomen: soft and NT, no HSM,  BS normal  Musculoskeletal: No deformities, no cyanosis or clubbing  Neuro: alert, non focal  Skin: Warm, no lesions or rashes  No results found. ABGs from 07/2013: pCO2 41  PH 7.41 pO2 81 on RA         Assessment & Plan:

## 2013-10-22 ENCOUNTER — Telehealth (HOSPITAL_COMMUNITY): Payer: Self-pay

## 2013-10-22 NOTE — Telephone Encounter (Signed)
I have called and left a message with Meleana to inquire about participation in Pulmonary Rehab. Will send letter in mail and follow up.

## 2013-10-22 NOTE — Telephone Encounter (Signed)
Patient states she will not be able to do the program because she can not afford it due to owing the hospital money.  Patient was encouraged to contact us in the future if her insurance and financial situation changes.

## 2013-10-29 DIAGNOSIS — L97809 Non-pressure chronic ulcer of other part of unspecified lower leg with unspecified severity: Secondary | ICD-10-CM | POA: Diagnosis not present

## 2013-10-29 DIAGNOSIS — L97909 Non-pressure chronic ulcer of unspecified part of unspecified lower leg with unspecified severity: Secondary | ICD-10-CM | POA: Diagnosis not present

## 2013-10-29 DIAGNOSIS — I87339 Chronic venous hypertension (idiopathic) with ulcer and inflammation of unspecified lower extremity: Secondary | ICD-10-CM | POA: Diagnosis not present

## 2013-11-01 DIAGNOSIS — Z09 Encounter for follow-up examination after completed treatment for conditions other than malignant neoplasm: Secondary | ICD-10-CM | POA: Diagnosis not present

## 2013-11-01 DIAGNOSIS — R928 Other abnormal and inconclusive findings on diagnostic imaging of breast: Secondary | ICD-10-CM | POA: Diagnosis not present

## 2013-11-05 DIAGNOSIS — L97809 Non-pressure chronic ulcer of other part of unspecified lower leg with unspecified severity: Secondary | ICD-10-CM | POA: Diagnosis not present

## 2013-11-05 DIAGNOSIS — I87339 Chronic venous hypertension (idiopathic) with ulcer and inflammation of unspecified lower extremity: Secondary | ICD-10-CM | POA: Diagnosis not present

## 2013-11-08 ENCOUNTER — Other Ambulatory Visit: Payer: Self-pay | Admitting: Cardiology

## 2013-11-12 ENCOUNTER — Encounter (HOSPITAL_BASED_OUTPATIENT_CLINIC_OR_DEPARTMENT_OTHER): Payer: Medicare Other | Attending: General Surgery

## 2013-11-12 DIAGNOSIS — I872 Venous insufficiency (chronic) (peripheral): Secondary | ICD-10-CM | POA: Insufficient documentation

## 2013-11-12 DIAGNOSIS — L97909 Non-pressure chronic ulcer of unspecified part of unspecified lower leg with unspecified severity: Secondary | ICD-10-CM | POA: Insufficient documentation

## 2013-11-18 ENCOUNTER — Other Ambulatory Visit: Payer: Self-pay | Admitting: Radiology

## 2013-11-18 DIAGNOSIS — N6019 Diffuse cystic mastopathy of unspecified breast: Secondary | ICD-10-CM | POA: Diagnosis not present

## 2013-11-18 DIAGNOSIS — D249 Benign neoplasm of unspecified breast: Secondary | ICD-10-CM | POA: Diagnosis not present

## 2013-11-18 DIAGNOSIS — Z0000 Encounter for general adult medical examination without abnormal findings: Secondary | ICD-10-CM | POA: Diagnosis not present

## 2013-11-18 DIAGNOSIS — N641 Fat necrosis of breast: Secondary | ICD-10-CM | POA: Diagnosis not present

## 2013-11-18 DIAGNOSIS — N6089 Other benign mammary dysplasias of unspecified breast: Secondary | ICD-10-CM | POA: Diagnosis not present

## 2013-12-28 DIAGNOSIS — M4806 Spinal stenosis, lumbar region: Secondary | ICD-10-CM | POA: Diagnosis not present

## 2013-12-28 DIAGNOSIS — M47817 Spondylosis without myelopathy or radiculopathy, lumbosacral region: Secondary | ICD-10-CM | POA: Diagnosis not present

## 2013-12-28 DIAGNOSIS — M545 Low back pain: Secondary | ICD-10-CM | POA: Diagnosis not present

## 2014-01-13 DIAGNOSIS — M4806 Spinal stenosis, lumbar region: Secondary | ICD-10-CM | POA: Diagnosis not present

## 2014-01-17 ENCOUNTER — Ambulatory Visit: Payer: Medicare Other | Admitting: *Deleted

## 2014-01-17 ENCOUNTER — Encounter: Payer: Self-pay | Admitting: Critical Care Medicine

## 2014-01-17 ENCOUNTER — Ambulatory Visit (INDEPENDENT_AMBULATORY_CARE_PROVIDER_SITE_OTHER): Payer: Medicare Other | Admitting: Critical Care Medicine

## 2014-01-17 VITALS — BP 120/62 | HR 66 | Ht 62.0 in | Wt 343.2 lb

## 2014-01-17 DIAGNOSIS — J441 Chronic obstructive pulmonary disease with (acute) exacerbation: Secondary | ICD-10-CM | POA: Diagnosis not present

## 2014-01-17 DIAGNOSIS — Z23 Encounter for immunization: Secondary | ICD-10-CM | POA: Diagnosis not present

## 2014-01-17 MED ORDER — FLUTICASONE PROPIONATE 50 MCG/ACT NA SUSP: 2.0000 | Freq: Every day | NASAL | Status: DC

## 2014-01-17 MED ORDER — AMOXICILLIN-POT CLAVULANATE 875-125 MG PO TABS: 1.0000 | ORAL_TABLET | Freq: Two times a day (BID) | ORAL | Status: DC

## 2014-01-17 MED ORDER — CHLORPHENIRAMINE MALEATE 4 MG PO TABS: 4.0000 mg | ORAL_TABLET | Freq: Two times a day (BID) | ORAL | Status: DC | PRN

## 2014-01-17 NOTE — Patient Instructions (Signed)
augmentin one twice daily for 7days Try chlorpheniramine 4mg  : two at bedtime Use NeilMed sinus rinse twice daily for two weeks and then as needed Start fluticasone two puff ea nostril daily A home rehab RN will be ordered No other changes Return 3 months

## 2014-01-17 NOTE — Progress Notes (Signed)
Subjective:    Patient ID: Morgan Odonnell, female    DOB: Jan 25, 1951, 63 y.o.   MRN: [id number]  HPI 01/17/2014 Chief Complaint  Patient presents with  . Follow-up    f/u emphysema, allergies; discuss new allergy medication, discuss rehabilitation, flu shot   C/o nasal congestion, bloody nasal drainage, dyspnea is at baseline.     Review of Systems Constitutional:   No  weight loss, night sweats,  Fevers, chills, fatigue, lassitude. HEENT:++ headaches,  Difficulty swallowing,  Tooth/dental problems,  ++Sore throat,                No sneezing, itching, ear ache, +++nasal congestion, +++post nasal drip,   CV:  No chest pain,  Orthopnea, PND, swelling in lower extremities, anasarca, dizziness, palpitations  GI  No heartburn, indigestion, abdominal pain, nausea, vomiting, diarrhea, change in bowel habits, loss of appetite  Resp: Notes  shortness of breath with exertion not at rest.  No excess mucus, no productive cough,  No non-productive cough,  No coughing up of blood.  No change in color of mucus.  No wheezing.  No chest wall deformity  Skin: no rash or lesions.  GU: no dysuria, change in color of urine, no urgency or frequency.  No flank pain.  MS:  No joint pain or swelling.  No decreased range of motion.  No back pain.  Psych:  No change in mood or affect. No depression or anxiety.  No memory loss.     Objective:   Physical Exam Filed Vitals:   01/17/14 1101  BP: 120/62  Pulse: 66  Height: 5\' 2"  (1.575 m)  Weight: 343 lb 3.2 oz (155.674 kg)  SpO2: 94%    Gen: Pleasant, obese  in no distress,  normal affect  ENT: No lesions,  mouth clear,  oropharynx clear, no postnasal drip  Neck: No JVD, no TMG, no carotid bruits  Lungs: No use of accessory muscles, no dullness to percussion, distant bs   Cardiovascular: RRR, heart sounds normal, no murmur or gallops, no peripheral edema  Abdomen: soft and NT, no HSM,  BS normal  Musculoskeletal: No deformities, no  cyanosis or clubbing  Neuro: alert, non focal  Skin: Warm, no lesions or rashes  No results found.     Assessment & Plan:   COPD (chronic obstructive pulmonary disease)Gold C Copd Gold C with mild exacerbation d/t sinusitis Plan augmentin one twice daily for 7days Try chlorpheniramine 4mg  : two at bedtime Use NeilMed sinus rinse twice daily for two weeks and then as needed Start fluticasone two puff ea nostril daily A home rehab RN will be ordered No other changes Return 3 months    Updated Medication List Outpatient Encounter Prescriptions as of 01/17/2014  Medication Sig  . albuterol (PROVENTIL HFA;VENTOLIN HFA) 108 (90 BASE) MCG/ACT inhaler Inhale 2 puffs into the lungs every 6 (six) hours as needed for wheezing.   . baclofen (LIORESAL) 10 MG tablet Take 1 tablet by mouth 3 (three) times daily as needed for muscle spasms.   . citalopram (CELEXA) 20 MG tablet Take 40 mg by mouth daily.   . diclofenac sodium (VOLTAREN) 1 % GEL Apply 1 application topically 4 (four) times daily. Applied to knees, shoulders, wrists, and ankles  . DULERA 200-5 MCG/ACT AERO TAKE 2 PUFFS TWICE A DAY  . fexofenadine (ALLEGRA) 180 MG tablet Take 180 mg by mouth 2 (two) times daily.  . furosemide (LASIX) 40 MG tablet TAKE 1 TABLET (40 MG TOTAL)  BY MOUTH 2-3 times daily  . HYDROcodone-acetaminophen (NORCO/VICODIN) 5-325 MG per tablet Take 1 tablet by mouth every 4 (four) hours as needed (Pain).   Marland Kitchen KLOR-CON M20 20 MEQ tablet TAKE 1 TABLET (20 MEQ TOTAL) BY MOUTH 2 (TWO) TIMES DAILY.  . meloxicam (MOBIC) 15 MG tablet Take 15 mg by mouth daily.  . metoprolol tartrate (LOPRESSOR) 25 MG tablet Take 1 tablet by mouth 2 (two) times daily.  . MOMETASONE FUROATE EX Apply 1 application topically as needed (For breakouts on legs).   Marland Kitchen tiotropium (SPIRIVA) 18 MCG inhalation capsule Place 1 capsule (18 mcg total) into inhaler and inhale daily.  . verapamil (CALAN) 120 MG tablet Take 120 mg by mouth 2 (two) times  daily.  Marland Kitchen amoxicillin-clavulanate (AUGMENTIN) 875-125 MG per tablet Take 1 tablet by mouth 2 (two) times daily.  . chlorpheniramine (CHLOR-TRIMETON) 4 MG tablet Take 1 tablet (4 mg total) by mouth 2 (two) times daily as needed for allergies.  . fluticasone (FLONASE) 50 MCG/ACT nasal spray Place 2 sprays into both nostrils daily.  . [DISCONTINUED] furosemide (LASIX) 40 MG tablet TAKE 1 TABLET (40 MG TOTAL) BY MOUTH 2 (TWO) TIMES DAILY.

## 2014-01-17 NOTE — Assessment & Plan Note (Signed)
Copd Gold C with mild exacerbation d/t sinusitis Plan augmentin one twice daily for 7days Try chlorpheniramine 4mg  : two at bedtime Use NeilMed sinus rinse twice daily for two weeks and then as needed Start fluticasone two puff ea nostril daily A home rehab RN will be ordered No other changes Return 3 months

## 2014-01-17 NOTE — Progress Notes (Signed)
At 11:40 am after being walked to Patient Care Coordinator's office, pt c/o feeling dizzy and felt like she was going to "fall out."  VS checked: o2 sat 93-94% Ra, pulse 64, BP 104/50.  Dr. Joya Gaskins notified and gave VO to have pt wait in office for continuing monitoring x 15-20 minutes.    At 1200, pt reports she feels better.  BP 104/60, o2 sat 95% RA, pulse 68.  Dr. Joya Gaskins aware.  Pt escorted to car by staff and husband via wheelchair.

## 2014-01-19 DIAGNOSIS — I10 Essential (primary) hypertension: Secondary | ICD-10-CM | POA: Diagnosis not present

## 2014-01-19 DIAGNOSIS — Z9981 Dependence on supplemental oxygen: Secondary | ICD-10-CM | POA: Diagnosis not present

## 2014-01-19 DIAGNOSIS — I5032 Chronic diastolic (congestive) heart failure: Secondary | ICD-10-CM | POA: Diagnosis not present

## 2014-01-19 DIAGNOSIS — Z87891 Personal history of nicotine dependence: Secondary | ICD-10-CM | POA: Diagnosis not present

## 2014-01-19 DIAGNOSIS — Z6841 Body Mass Index (BMI) 40.0 and over, adult: Secondary | ICD-10-CM | POA: Diagnosis not present

## 2014-01-19 DIAGNOSIS — J441 Chronic obstructive pulmonary disease with (acute) exacerbation: Secondary | ICD-10-CM | POA: Diagnosis not present

## 2014-01-19 DIAGNOSIS — M545 Low back pain: Secondary | ICD-10-CM | POA: Diagnosis not present

## 2014-01-26 DIAGNOSIS — Z6841 Body Mass Index (BMI) 40.0 and over, adult: Secondary | ICD-10-CM | POA: Diagnosis not present

## 2014-01-26 DIAGNOSIS — I5032 Chronic diastolic (congestive) heart failure: Secondary | ICD-10-CM | POA: Diagnosis not present

## 2014-01-26 DIAGNOSIS — I10 Essential (primary) hypertension: Secondary | ICD-10-CM | POA: Diagnosis not present

## 2014-01-26 DIAGNOSIS — M545 Low back pain: Secondary | ICD-10-CM | POA: Diagnosis not present

## 2014-01-26 DIAGNOSIS — J441 Chronic obstructive pulmonary disease with (acute) exacerbation: Secondary | ICD-10-CM | POA: Diagnosis not present

## 2014-01-28 DIAGNOSIS — M545 Low back pain: Secondary | ICD-10-CM | POA: Diagnosis not present

## 2014-01-28 DIAGNOSIS — I5032 Chronic diastolic (congestive) heart failure: Secondary | ICD-10-CM | POA: Diagnosis not present

## 2014-01-28 DIAGNOSIS — J441 Chronic obstructive pulmonary disease with (acute) exacerbation: Secondary | ICD-10-CM | POA: Diagnosis not present

## 2014-01-28 DIAGNOSIS — I10 Essential (primary) hypertension: Secondary | ICD-10-CM | POA: Diagnosis not present

## 2014-01-28 DIAGNOSIS — Z6841 Body Mass Index (BMI) 40.0 and over, adult: Secondary | ICD-10-CM | POA: Diagnosis not present

## 2014-01-31 DIAGNOSIS — I5032 Chronic diastolic (congestive) heart failure: Secondary | ICD-10-CM | POA: Diagnosis not present

## 2014-01-31 DIAGNOSIS — M545 Low back pain: Secondary | ICD-10-CM | POA: Diagnosis not present

## 2014-01-31 DIAGNOSIS — J441 Chronic obstructive pulmonary disease with (acute) exacerbation: Secondary | ICD-10-CM | POA: Diagnosis not present

## 2014-01-31 DIAGNOSIS — Z6841 Body Mass Index (BMI) 40.0 and over, adult: Secondary | ICD-10-CM | POA: Diagnosis not present

## 2014-01-31 DIAGNOSIS — I10 Essential (primary) hypertension: Secondary | ICD-10-CM | POA: Diagnosis not present

## 2014-02-02 DIAGNOSIS — I5032 Chronic diastolic (congestive) heart failure: Secondary | ICD-10-CM | POA: Diagnosis not present

## 2014-02-02 DIAGNOSIS — Z6841 Body Mass Index (BMI) 40.0 and over, adult: Secondary | ICD-10-CM | POA: Diagnosis not present

## 2014-02-02 DIAGNOSIS — I10 Essential (primary) hypertension: Secondary | ICD-10-CM | POA: Diagnosis not present

## 2014-02-02 DIAGNOSIS — M545 Low back pain: Secondary | ICD-10-CM | POA: Diagnosis not present

## 2014-02-02 DIAGNOSIS — J441 Chronic obstructive pulmonary disease with (acute) exacerbation: Secondary | ICD-10-CM | POA: Diagnosis not present

## 2014-02-07 DIAGNOSIS — J441 Chronic obstructive pulmonary disease with (acute) exacerbation: Secondary | ICD-10-CM | POA: Diagnosis not present

## 2014-02-07 DIAGNOSIS — Z6841 Body Mass Index (BMI) 40.0 and over, adult: Secondary | ICD-10-CM | POA: Diagnosis not present

## 2014-02-07 DIAGNOSIS — I10 Essential (primary) hypertension: Secondary | ICD-10-CM | POA: Diagnosis not present

## 2014-02-07 DIAGNOSIS — M545 Low back pain: Secondary | ICD-10-CM | POA: Diagnosis not present

## 2014-02-07 DIAGNOSIS — I5032 Chronic diastolic (congestive) heart failure: Secondary | ICD-10-CM | POA: Diagnosis not present

## 2014-02-10 DIAGNOSIS — Z6841 Body Mass Index (BMI) 40.0 and over, adult: Secondary | ICD-10-CM | POA: Diagnosis not present

## 2014-02-10 DIAGNOSIS — J441 Chronic obstructive pulmonary disease with (acute) exacerbation: Secondary | ICD-10-CM | POA: Diagnosis not present

## 2014-02-10 DIAGNOSIS — I5032 Chronic diastolic (congestive) heart failure: Secondary | ICD-10-CM | POA: Diagnosis not present

## 2014-02-10 DIAGNOSIS — I10 Essential (primary) hypertension: Secondary | ICD-10-CM | POA: Diagnosis not present

## 2014-02-10 DIAGNOSIS — M545 Low back pain: Secondary | ICD-10-CM | POA: Diagnosis not present

## 2014-02-17 DIAGNOSIS — Z6841 Body Mass Index (BMI) 40.0 and over, adult: Secondary | ICD-10-CM | POA: Diagnosis not present

## 2014-02-17 DIAGNOSIS — I5032 Chronic diastolic (congestive) heart failure: Secondary | ICD-10-CM | POA: Diagnosis not present

## 2014-02-17 DIAGNOSIS — M545 Low back pain: Secondary | ICD-10-CM | POA: Diagnosis not present

## 2014-02-17 DIAGNOSIS — J441 Chronic obstructive pulmonary disease with (acute) exacerbation: Secondary | ICD-10-CM | POA: Diagnosis not present

## 2014-02-17 DIAGNOSIS — I10 Essential (primary) hypertension: Secondary | ICD-10-CM | POA: Diagnosis not present

## 2014-02-21 ENCOUNTER — Other Ambulatory Visit: Payer: Self-pay | Admitting: Adult Health

## 2014-02-22 DIAGNOSIS — Z6841 Body Mass Index (BMI) 40.0 and over, adult: Secondary | ICD-10-CM | POA: Diagnosis not present

## 2014-02-22 DIAGNOSIS — J441 Chronic obstructive pulmonary disease with (acute) exacerbation: Secondary | ICD-10-CM | POA: Diagnosis not present

## 2014-02-22 DIAGNOSIS — M545 Low back pain: Secondary | ICD-10-CM | POA: Diagnosis not present

## 2014-02-22 DIAGNOSIS — I10 Essential (primary) hypertension: Secondary | ICD-10-CM | POA: Diagnosis not present

## 2014-02-22 DIAGNOSIS — I5032 Chronic diastolic (congestive) heart failure: Secondary | ICD-10-CM | POA: Diagnosis not present

## 2014-02-28 ENCOUNTER — Other Ambulatory Visit: Payer: Self-pay | Admitting: Adult Health

## 2014-03-01 DIAGNOSIS — M545 Low back pain: Secondary | ICD-10-CM | POA: Diagnosis not present

## 2014-03-01 DIAGNOSIS — J441 Chronic obstructive pulmonary disease with (acute) exacerbation: Secondary | ICD-10-CM | POA: Diagnosis not present

## 2014-03-01 DIAGNOSIS — I10 Essential (primary) hypertension: Secondary | ICD-10-CM | POA: Diagnosis not present

## 2014-03-01 DIAGNOSIS — I5032 Chronic diastolic (congestive) heart failure: Secondary | ICD-10-CM | POA: Diagnosis not present

## 2014-03-01 DIAGNOSIS — Z6841 Body Mass Index (BMI) 40.0 and over, adult: Secondary | ICD-10-CM | POA: Diagnosis not present

## 2014-03-10 DIAGNOSIS — J441 Chronic obstructive pulmonary disease with (acute) exacerbation: Secondary | ICD-10-CM | POA: Diagnosis not present

## 2014-03-10 DIAGNOSIS — M545 Low back pain: Secondary | ICD-10-CM | POA: Diagnosis not present

## 2014-03-10 DIAGNOSIS — I10 Essential (primary) hypertension: Secondary | ICD-10-CM | POA: Diagnosis not present

## 2014-03-10 DIAGNOSIS — I5032 Chronic diastolic (congestive) heart failure: Secondary | ICD-10-CM | POA: Diagnosis not present

## 2014-03-10 DIAGNOSIS — Z6841 Body Mass Index (BMI) 40.0 and over, adult: Secondary | ICD-10-CM | POA: Diagnosis not present

## 2014-03-17 DIAGNOSIS — M545 Low back pain: Secondary | ICD-10-CM | POA: Diagnosis not present

## 2014-03-17 DIAGNOSIS — I5032 Chronic diastolic (congestive) heart failure: Secondary | ICD-10-CM | POA: Diagnosis not present

## 2014-03-17 DIAGNOSIS — Z6841 Body Mass Index (BMI) 40.0 and over, adult: Secondary | ICD-10-CM | POA: Diagnosis not present

## 2014-03-17 DIAGNOSIS — J441 Chronic obstructive pulmonary disease with (acute) exacerbation: Secondary | ICD-10-CM | POA: Diagnosis not present

## 2014-03-17 DIAGNOSIS — I10 Essential (primary) hypertension: Secondary | ICD-10-CM | POA: Diagnosis not present

## 2014-03-24 ENCOUNTER — Telehealth (HOSPITAL_COMMUNITY): Payer: Self-pay

## 2014-03-24 ENCOUNTER — Other Ambulatory Visit (HOSPITAL_COMMUNITY): Payer: Self-pay

## 2014-03-24 NOTE — Telephone Encounter (Signed)
Opened in error

## 2014-03-24 NOTE — Telephone Encounter (Signed)
Patient's wife left message requesting to know "what's going on with him" in referring to her husband.  Attempted call backs to get more detail and no answer.  Message left.

## 2014-03-31 DIAGNOSIS — E039 Hypothyroidism, unspecified: Secondary | ICD-10-CM | POA: Diagnosis not present

## 2014-04-04 ENCOUNTER — Telehealth: Payer: Self-pay | Admitting: Critical Care Medicine

## 2014-04-04 MED ORDER — BUDESONIDE-FORMOTEROL FUMARATE 160-4.5 MCG/ACT IN AERO: 2.0000 | INHALATION_SPRAY | Freq: Two times a day (BID) | RESPIRATORY_TRACT | Status: DC

## 2014-04-04 NOTE — Telephone Encounter (Signed)
Received letter from [facility] stating Morgan Odonnell is not on the pt's formulary now. Preferred medications are symbicort and advair diskus. Per Dr. Joya Gaskins, ok to change dulera 200 to symbicort 160 2 puffs bid.   Called, spoke with pt.  Discussed above.  Pt verbalized understanding and is aware symbicort rx sent to [facility]. She is to call office back if she has any problems with symbicort.  Pt verbalized understanding of instructions, is in agreement with plan, and voiced no further questions or concerns at this time.

## 2014-04-21 ENCOUNTER — Other Ambulatory Visit: Payer: Self-pay | Admitting: Critical Care Medicine

## 2014-04-22 ENCOUNTER — Other Ambulatory Visit: Payer: Self-pay | Admitting: Critical Care Medicine

## 2014-05-10 DIAGNOSIS — E039 Hypothyroidism, unspecified: Secondary | ICD-10-CM | POA: Diagnosis not present

## 2014-05-20 DIAGNOSIS — E039 Hypothyroidism, unspecified: Secondary | ICD-10-CM | POA: Diagnosis not present

## 2014-05-20 DIAGNOSIS — R7301 Impaired fasting glucose: Secondary | ICD-10-CM | POA: Diagnosis not present

## 2014-05-26 ENCOUNTER — Encounter (HOSPITAL_COMMUNITY): Payer: Self-pay | Admitting: Emergency Medicine

## 2014-05-26 ENCOUNTER — Emergency Department (HOSPITAL_COMMUNITY)
Admission: EM | Admit: 2014-05-26 | Discharge: 2014-05-26 | Disposition: A | Discharge status: Home / Self Care | Payer: Medicare Other | Attending: Emergency Medicine | Admitting: Emergency Medicine

## 2014-05-26 ENCOUNTER — Emergency Department (HOSPITAL_COMMUNITY): Payer: Medicare Other

## 2014-05-26 DIAGNOSIS — Z791 Long term (current) use of non-steroidal anti-inflammatories (NSAID): Secondary | ICD-10-CM | POA: Insufficient documentation

## 2014-05-26 DIAGNOSIS — S4992XA Unspecified injury of left shoulder and upper arm, initial encounter: Secondary | ICD-10-CM | POA: Insufficient documentation

## 2014-05-26 DIAGNOSIS — M542 Cervicalgia: Secondary | ICD-10-CM | POA: Diagnosis not present

## 2014-05-26 DIAGNOSIS — I10 Essential (primary) hypertension: Secondary | ICD-10-CM | POA: Insufficient documentation

## 2014-05-26 DIAGNOSIS — Z7951 Long term (current) use of inhaled steroids: Secondary | ICD-10-CM | POA: Insufficient documentation

## 2014-05-26 DIAGNOSIS — S199XXA Unspecified injury of neck, initial encounter: Secondary | ICD-10-CM | POA: Diagnosis not present

## 2014-05-26 DIAGNOSIS — R011 Cardiac murmur, unspecified: Secondary | ICD-10-CM | POA: Diagnosis not present

## 2014-05-26 DIAGNOSIS — Z792 Long term (current) use of antibiotics: Secondary | ICD-10-CM | POA: Insufficient documentation

## 2014-05-26 DIAGNOSIS — W108XXA Fall (on) (from) other stairs and steps, initial encounter: Secondary | ICD-10-CM | POA: Insufficient documentation

## 2014-05-26 DIAGNOSIS — Y9289 Other specified places as the place of occurrence of the external cause: Secondary | ICD-10-CM | POA: Diagnosis not present

## 2014-05-26 DIAGNOSIS — J449 Chronic obstructive pulmonary disease, unspecified: Secondary | ICD-10-CM | POA: Insufficient documentation

## 2014-05-26 DIAGNOSIS — Z8719 Personal history of other diseases of the digestive system: Secondary | ICD-10-CM | POA: Diagnosis not present

## 2014-05-26 DIAGNOSIS — M25512 Pain in left shoulder: Secondary | ICD-10-CM

## 2014-05-26 DIAGNOSIS — S2232XA Fracture of one rib, left side, initial encounter for closed fracture: Secondary | ICD-10-CM | POA: Insufficient documentation

## 2014-05-26 DIAGNOSIS — Z87891 Personal history of nicotine dependence: Secondary | ICD-10-CM | POA: Diagnosis not present

## 2014-05-26 DIAGNOSIS — Z79899 Other long term (current) drug therapy: Secondary | ICD-10-CM | POA: Insufficient documentation

## 2014-05-26 DIAGNOSIS — S299XXA Unspecified injury of thorax, initial encounter: Secondary | ICD-10-CM | POA: Diagnosis present

## 2014-05-26 DIAGNOSIS — Y9389 Activity, other specified: Secondary | ICD-10-CM | POA: Insufficient documentation

## 2014-05-26 DIAGNOSIS — Y998 Other external cause status: Secondary | ICD-10-CM | POA: Diagnosis not present

## 2014-05-26 DIAGNOSIS — W19XXXA Unspecified fall, initial encounter: Secondary | ICD-10-CM

## 2014-05-26 MED ORDER — HYDROCODONE-ACETAMINOPHEN 5-325 MG PO TABS: 1.0000 | ORAL_TABLET | ORAL | Status: DC | PRN

## 2014-05-26 NOTE — ED Notes (Signed)
Pt sts fell down steps last night c/o pain in neck and left arm; pt sts did hit head but denies LOC

## 2014-05-26 NOTE — ED Provider Notes (Signed)
CSN: [id number]     Arrival date & time 05/26/14  1507 History  This chart was scribed for non-physician practitioner Glendell Docker, NP working with Charlesetta Shanks, MD, by Eustaquio Maize, ED Scribe. This patient was seen in room TR11C/TR11C and the patient's care was started at 3:36 PM.    Chief Complaint  Patient presents with  . Fall   The history is provided by the patient. No language interpreter was used.     HPI Comments: Morgan Odonnell is a 64 y.o. female who presents to the Emergency Department complaining of  Left ribs and shoulder pain and neck pain s/p fall last night. She states fell down 3 steps last night and landed on concrete. Pt also complains of left shoulder pain and neck pain. Pt mentions that after falling, she states that she had numbness on the left side which has been happening intermittently over the last couple of months. She says that the numbness has since gone away. Pt denies taking pain medication today for the pain. She reports that prior to falling yesterday, she had multiple asthma exacerbations. Pt has used her inhaler with mild relief. She denies LOC, diaphoresis, nausea, vomiting, or any other symptoms. Pt is not currently on anticoagulants.    Past Medical History  Diagnosis Date  . Oral lesion   . Leg edema     chronic, bilateral  . Morbid obesity   . Chronic cough   . COPD (chronic obstructive pulmonary disease)   . Hypertension   . Low back pain   . Snoring   . GERD (gastroesophageal reflux disease)   . Heart murmur   . Shortness of breath    Past Surgical History  Procedure Laterality Date  . Meniscus repair    . Cholecystectomy    . Appendectomy    . Tubal ligation    . Ectopic pregnancy surgery    . Tonsillectomy    . Colonoscopy N/A 06/25/2013    Procedure: COLONOSCOPY;  Surgeon: [name], MD;  Location: [facility];  Service: Endoscopy;  Laterality: N/A;   Family History  Problem Relation Age of Onset  . Cancer Mother      ovarian  . Cancer Brother     lymphomia  . Cancer Maternal Aunt     ovarian  . Lung disease Mother   . Asthma Daughter   . Heart failure Mother    History  Substance Use Topics  . Smoking status: Former Smoker -- 1.50 packs/day for 45 years    Types: Cigarettes    Quit date: 02/09/2011  . Smokeless tobacco: Not on file  . Alcohol Use: No   OB History    No data available     Review of Systems  Constitutional: Negative for diaphoresis.  Cardiovascular: Positive for chest pain.  Gastrointestinal: Negative for nausea and vomiting.  Musculoskeletal: Positive for arthralgias (Left shoulder pain. ) and neck pain.  Neurological: Positive for numbness (Left sided numbness; since resolved. ).  All other systems reviewed and are negative.     Allergies  Gabapentin  Home Medications   Prior to Admission medications   Medication Sig Start Date End Date Taking? Authorizing Provider  albuterol (PROVENTIL HFA;VENTOLIN HFA) 108 (90 BASE) MCG/ACT inhaler Inhale 2 puffs into the lungs every 6 (six) hours as needed for wheezing.     Historical Provider, MD  amoxicillin-clavulanate (AUGMENTIN) 875-125 MG per tablet Take 1 tablet by mouth 2 (two) times daily. 01/17/14   Burnett Harry  Joya Gaskins, MD  baclofen (LIORESAL) 10 MG tablet Take 1 tablet by mouth 3 (three) times daily as needed for muscle spasms.     Historical Provider, MD  budesonide-formoterol (SYMBICORT) 160-4.5 MCG/ACT inhaler Inhale 2 puffs into the lungs 2 (two) times daily. 04/04/14   Elsie Stain, MD  chlorpheniramine (CHLOR-TRIMETON) 4 MG tablet TAKE 1 TABLET (4 MG TOTAL) BY MOUTH 2 (TWO) TIMES DAILY AS NEEDED FOR ALLERGIES. 04/22/14   Elsie Stain, MD  citalopram (CELEXA) 20 MG tablet Take 40 mg by mouth daily.     Historical Provider, MD  diclofenac sodium (VOLTAREN) 1 % GEL Apply 1 application topically 4 (four) times daily. Applied to knees, shoulders, wrists, and ankles    Historical Provider, MD  fexofenadine  (ALLEGRA) 180 MG tablet Take 180 mg by mouth 2 (two) times daily.    Historical Provider, MD  fluticasone (FLONASE) 50 MCG/ACT nasal spray PLACE 2 SPRAYS INTO BOTH NOSTRILS DAILY. 04/22/14   Elsie Stain, MD  furosemide (LASIX) 40 MG tablet TAKE 1 TABLET (40 MG TOTAL) BY MOUTH 2-3 times daily    Larey Dresser, MD  HYDROcodone-acetaminophen (NORCO/VICODIN) 5-325 MG per tablet Take 1 tablet by mouth every 4 (four) hours as needed (Pain).     Historical Provider, MD  KLOR-CON M20 20 MEQ tablet TAKE 1 TABLET (20 MEQ TOTAL) BY MOUTH 2 (TWO) TIMES DAILY.    Larey Dresser, MD  meloxicam (MOBIC) 15 MG tablet Take 15 mg by mouth daily.    Historical Provider, MD  metoprolol tartrate (LOPRESSOR) 25 MG tablet Take 1 tablet by mouth 2 (two) times daily. 11/23/12   Historical Provider, MD  MOMETASONE FUROATE EX Apply 1 application topically as needed (For breakouts on legs).     Historical Provider, MD  tiotropium (SPIRIVA) 18 MCG inhalation capsule Place 1 capsule (18 mcg total) into inhaler and inhale daily. 08/26/13   Tammy S Parrett, NP  verapamil (CALAN) 120 MG tablet Take 120 mg by mouth 2 (two) times daily.    Historical Provider, MD   BP 149/81 mmHg  Pulse 81  Temp(Src) 98.1 F (36.7 C) (Oral)  Resp 24  SpO2 92%   Physical Exam  Constitutional: She is oriented to person, place, and time. She appears well-developed and well-nourished. No distress.  HENT:  Head: Normocephalic and atraumatic.  Eyes: Conjunctivae and EOM are normal.  Neck: Neck supple. No tracheal deviation present.  Cardiovascular: Normal rate.   Pulmonary/Chest: Effort normal. No respiratory distress.  Musculoskeletal: Normal range of motion.       Cervical back: She exhibits bony tenderness.  Decreased rom. No deformity or swelling noted  Neurological: She is alert and oriented to person, place, and time.  Skin: Skin is warm and dry.  Psychiatric: She has a normal mood and affect. Her behavior is normal.  Nursing note  and vitals reviewed.   ED Course  Procedures (including critical care time)  DIAGNOSTIC STUDIES: Oxygen Saturation is 92% on RA, normal by my interpretation.    COORDINATION OF CARE: 3:42 PM-Discussed treatment plan which includes DG C Spine, DG Left shoulder, DG Ribs with Left chest with pt at bedside and pt agreed to plan.   Labs Review Labs Reviewed - No data to display  Imaging Review No results found.   EKG Interpretation None      MDM   Final diagnoses:  None   Pt left with Will dancie PA pending x-ray  I personally performed the services described  in this documentation, which was scribed in my presence. The recorded information has been reviewed and is accurate.      Glendell Docker, NP 05/26/14 1633  Charlesetta Shanks, MD 05/28/14 1531

## 2014-05-26 NOTE — Discharge Instructions (Signed)
Rib Fracture A rib fracture is a break or crack in one of the bones of the ribs. The ribs are a group of long, curved bones that wrap around your chest and attach to your spine. They protect your lungs and other organs in the chest cavity. A broken or cracked rib is often painful, but most do not cause other problems. Most rib fractures heal on their own over time. However, rib fractures can be more serious if multiple ribs are broken or if broken ribs move out of place and push against other structures. CAUSES   A direct blow to the chest. For example, this could happen during contact sports, a car accident, or a fall against a hard object.  Repetitive movements with high force, such as pitching a baseball or having severe coughing spells. SYMPTOMS   Pain when you breathe in or cough.  Pain when someone presses on the injured area. DIAGNOSIS  Your caregiver will perform a physical exam. Various imaging tests may be ordered to confirm the diagnosis and to look for related injuries. These tests may include a chest X-ray, computed tomography (CT), magnetic resonance imaging (MRI), or a bone scan. TREATMENT  Rib fractures usually heal on their own in 1-3 months. The longer healing period is often associated with a continued cough or other aggravating activities. During the healing period, pain control is very important. Medication is usually given to control pain. Hospitalization or surgery may be needed for more severe injuries, such as those in which multiple ribs are broken or the ribs have moved out of place.  HOME CARE INSTRUCTIONS   Avoid strenuous activity and any activities or movements that cause pain. Be careful during activities and avoid bumping the injured rib.  Gradually increase activity as directed by your caregiver.  Only take over-the-counter or prescription medications as directed by your caregiver. Do not take other medications without asking your caregiver first.  Apply ice  to the injured area for the first 1-2 days after you have been treated or as directed by your caregiver. Applying ice helps to reduce inflammation and pain.  Put ice in a plastic bag.  Place a towel between your skin and the bag.   Leave the ice on for 15-20 minutes at a time, every 2 hours while you are awake.  Perform deep breathing as directed by your caregiver. This will help prevent pneumonia, which is a common complication of a broken rib. Your caregiver may instruct you to:  Take deep breaths several times a day.  Try to cough several times a day, holding a pillow against the injured area.  Use a device called an incentive spirometer to practice deep breathing several times a day.  Drink enough fluids to keep your urine clear or pale yellow. This will help you avoid constipation.   Do not wear a rib belt or binder. These restrict breathing, which can lead to pneumonia.  SEEK IMMEDIATE MEDICAL CARE IF:   You have a fever.   You have difficulty breathing or shortness of breath.   You develop a continual cough, or you cough up thick or bloody sputum.  You feel sick to your stomach (nausea), throw up (vomit), or have abdominal pain.   You have worsening pain not controlled with medications.  MAKE SURE YOU:  Understand these instructions.  Will watch your condition.  Will get help right away if you are not doing well or get worse. Document Released: 02/25/2005 Document Revised: 10/28/2012 Document Reviewed:  04/29/2012 Exit[employer] Patient Information 2015 [place], [place]. This information is not intended to replace advice given to you by your health care provider. Make sure you discuss any questions you have with your health care provider. Fall Prevention and Home Safety Falls cause injuries and can affect all age groups. It is possible to use preventive measures to significantly decrease the likelihood of falls. There are many simple measures which can make your home  safer and prevent falls. OUTDOORS  Repair cracks and edges of walkways and driveways.  Remove high doorway thresholds.  Trim shrubbery on the main path into your home.  Have good outside lighting.  Clear walkways of tools, rocks, debris, and clutter.  Check that handrails are not broken and are securely fastened. Both sides of steps should have handrails.  Have leaves, snow, and ice cleared regularly.  Use sand or salt on walkways during winter months.  In the garage, clean up grease or oil spills. BATHROOM  Install night lights.  Install grab bars by the toilet and in the tub and shower.  Use non-skid mats or decals in the tub or shower.  Place a plastic non-slip stool in the shower to sit on, if needed.  Keep floors dry and clean up all water on the floor immediately.  Remove soap buildup in the tub or shower on a regular basis.  Secure bath mats with non-slip, double-sided rug tape.  Remove throw rugs and tripping hazards from the floors. BEDROOMS  Install night lights.  Make sure a bedside light is easy to reach.  Do not use oversized bedding.  Keep a telephone by your bedside.  Have a firm chair with side arms to use for getting dressed.  Remove throw rugs and tripping hazards from the floor. KITCHEN  Keep handles on pots and pans turned toward the center of the stove. Use back burners when possible.  Clean up spills quickly and allow time for drying.  Avoid walking on wet floors.  Avoid hot utensils and knives.  Position shelves so they are not too high or low.  Place commonly used objects within easy reach.  If necessary, use a sturdy step stool with a grab bar when reaching.  Keep electrical cables out of the way.  Do not use floor polish or wax that makes floors slippery. If you must use wax, use non-skid floor wax.  Remove throw rugs and tripping hazards from the floor. STAIRWAYS  Never leave objects on stairs.  Place handrails on  both sides of stairways and use them. Fix any loose handrails. Make sure handrails on both sides of the stairways are as long as the stairs.  Check carpeting to make sure it is firmly attached along stairs. Make repairs to worn or loose carpet promptly.  Avoid placing throw rugs at the top or bottom of stairways, or properly secure the rug with carpet tape to prevent slippage. Get rid of throw rugs, if possible.  Have an electrician put in a light switch at the top and bottom of the stairs. OTHER FALL PREVENTION TIPS  Wear low-heel or rubber-soled shoes that are supportive and fit well. Wear closed toe shoes.  When using a stepladder, make sure it is fully opened and both spreaders are firmly locked. Do not climb a closed stepladder.  Add color or contrast paint or tape to grab bars and handrails in your home. Place contrasting color strips on first and last steps.  Learn and use mobility aids as needed. Install an  electrical emergency response system.  Turn on lights to avoid dark areas. Replace light bulbs that burn out immediately. Get light switches that glow.  Arrange furniture to create clear pathways. Keep furniture in the same place.  Firmly attach carpet with non-skid or double-sided tape.  Eliminate uneven floor surfaces.  Select a carpet pattern that does not visually hide the edge of steps.  Be aware of all pets. OTHER HOME SAFETY TIPS  Set the water temperature for 120 F (48.8 C).  Keep emergency numbers on or near the telephone.  Keep smoke detectors on every level of the home and near sleeping areas. Document Released: 02/15/2002 Document Revised: 08/27/2011 Document Reviewed: 05/17/2011 [facility] Patient Information 2015 [place], [place]. This information is not intended to replace advice given to you by your health care provider. Make sure you discuss any questions you have with your health care provider.

## 2014-05-26 NOTE — ED Provider Notes (Signed)
Patient care assumed from Glendell Docker, NP at shift change.  Please see her note for further.  Morgan Odonnell is a 64 y.o. female who presents to the Emergency Department complaining of Left ribs and shoulder pain and neck pain s/p fall last night. She states fell down 3 steps last night and landed on concrete. Pt also complains of left shoulder pain and neck pain. X-rays pending.  Cervical spine x-rays negative. Left shoulder x-ray is negative. Left rib x-ray with chest indicates a hairline nondisplaced fracture of the left 8th rib.  At my evaluation the patient reports feeling improved from earlier. I reviewed x-ray findings. Will provide with pain medicine and have her follow up with her PCP this week. I advised she could use a balloon to help keep her lungs clear. Education provided on rib fractures. I advised the patient to follow-up with their primary care provider this week. I advised the patient to return to the emergency department with new or worsening symptoms or new concerns. The patient verbalized understanding and agreement with plan.   Results for orders placed or performed in visit on 15/17/[birth year]  Basic Metabolic Panel (BMET)  Result Value Ref Range   Sodium 137 135 - 145 mEq/L   Potassium 4.5 3.5 - 5.1 mEq/L   Chloride 97 96 - 112 mEq/L   CO2 32 19 - 32 mEq/L   Glucose, Bld 107 (H) 70 - 99 mg/dL   BUN 16 6 - 23 mg/dL   Creatinine, Ser 1.0 0.4 - 1.2 mg/dL   Calcium 9.4 8.4 - 10.5 mg/dL   GFR 57.85 (L) >60.00 mL/min  B Nat Peptide  Result Value Ref Range   Pro B Natriuretic peptide (BNP) 112.0 (H) 0.0 - 100.0 pg/mL   Dg Ribs Unilateral W/chest Left  05/26/2014   CLINICAL DATA:  Initial evaluation lower left lateral rib pain for 2 days after falling down stairs with shortness of breath former smoker  EXAM: LEFT RIBS AND CHEST - 3+ VIEW  COMPARISON:  08/26/2013  FINDINGS: Study limited by patient discomfort and body habitus. Mild cardiac enlargement. Stable scarring or  discoid atelectasis lateral left lower lobe. Vascular pattern normal but mild diffuse interstitial prominence similar to prior study. No evidence of pneumothorax or pleural effusion. Hairline fracture lateral left eighth rib.  IMPRESSION: Limited study due to body habitus. Hairline nondisplaced fracture left eighth rib.  Chronic mild interstitial lung disease.   Electronically Signed   By: Skipper Cliche M.D.   On: 05/26/2014 16:49   Dg Cervical Spine Complete  05/26/2014   CLINICAL DATA:  65 year old female with left lateral neck and left shoulder pain after falling down the stairs 2 days previously.  EXAM: CERVICAL SPINE  4+ VIEWS  COMPARISON:  Concurrently obtained radiographs of the left shoulder and chest  FINDINGS: There is no evidence of cervical spine fracture or prevertebral soft tissue swelling. Alignment is normal. No other significant bone abnormalities are identified.  IMPRESSION: Negative cervical spine radiographs.   Electronically Signed   By: Jacqulynn Cadet M.D.   On: 05/26/2014 16:50   Dg Shoulder Left  05/26/2014   CLINICAL DATA:  Left shoulder pain following recent fall down stairs, initial encounter  EXAM: LEFT SHOULDER - 2+ VIEW  COMPARISON:  None.  FINDINGS: No acute fracture or dislocation is noted. The underlying bony thorax is within normal limits. A calcification is noted adjacent to the head of the humerus. This likely represents some ligamentous calcification. No other soft tissue abnormality  is seen.  IMPRESSION: No acute bony abnormality noted.   Electronically Signed   By: Inez Catalina M.D.   On: 05/26/2014 16:48      Waynetta Pean, PA-C 05/26/14 [place], MD 05/27/14 0010

## 2014-05-27 ENCOUNTER — Telehealth: Payer: Self-pay | Admitting: *Deleted

## 2014-05-27 NOTE — Telephone Encounter (Signed)
Pharmacy called  stating pt JUST got prescription for  HYDROcodone-acetaminophen (NORCO/VICODIN) 5-325 MG per tablet #30 filled on 05/17/14.  NCM advised not to fill prescription given 3/17 for same medication.

## 2014-06-15 ENCOUNTER — Encounter: Payer: Self-pay | Admitting: Critical Care Medicine

## 2014-06-15 ENCOUNTER — Ambulatory Visit (INDEPENDENT_AMBULATORY_CARE_PROVIDER_SITE_OTHER): Payer: Medicare Other | Admitting: Critical Care Medicine

## 2014-06-15 VITALS — BP 106/60 | HR 69 | Temp 98.4°F | Ht 62.0 in | Wt 323.0 lb

## 2014-06-15 DIAGNOSIS — J418 Mixed simple and mucopurulent chronic bronchitis: Secondary | ICD-10-CM

## 2014-06-15 MED ORDER — MOMETASONE FURO-FORMOTEROL FUM 200-5 MCG/ACT IN AERO: INHALATION_SPRAY | RESPIRATORY_TRACT | Status: DC

## 2014-06-15 NOTE — Progress Notes (Signed)
Subjective:    Patient ID: Morgan Odonnell, female    DOB: 05-Sep-1950, 64 y.o.   MRN: [id number]  HPI  06/15/2014 Chief Complaint  Patient presents with  . Follow-up    Pt states she has SOB with activity, a prod cough with thick white mucus. Wheezing at times and sinus congestion. Denies any n/v/d or f/c/s  Pt still has dyspnea with activity.  Notes some white mucus.  occ wheezing. Notes some pollen.  Notes some wheezing.  No real chest pain. Pt fell 05/26/14: No freq falls.  Fractured rib.    Review of Systems Constitutional:   No  weight loss, night sweats,  Fevers, chills, fatigue, lassitude. HEENT:++ headaches,  Difficulty swallowing,  Tooth/dental problems,  ++Sore throat,                No sneezing, itching, ear ache, +++nasal congestion, +++post nasal drip,   CV:  No chest pain,  Orthopnea, PND, swelling in lower extremities, anasarca, dizziness, palpitations  GI  No heartburn, indigestion, abdominal pain, nausea, vomiting, diarrhea, change in bowel habits, loss of appetite  Resp: Notes  shortness of breath with exertion not at rest.  No excess mucus, no productive cough,  No non-productive cough,  No coughing up of blood.  No change in color of mucus.  No wheezing.  No chest wall deformity  Skin: no rash or lesions.  GU: no dysuria, change in color of urine, no urgency or frequency.  No flank pain.  MS:  No joint pain or swelling.  No decreased range of motion.  No back pain.  Psych:  No change in mood or affect. No depression or anxiety.  No memory loss.     Objective:   Physical Exam Filed Vitals:   06/15/14 1139  BP: 106/60  Pulse: 69  Temp: 98.4 F (36.9 C)  TempSrc: Oral  Height: 5\' 2"  (1.575 m)  Weight: 323 lb (146.512 kg)  SpO2: 93%    Gen: Pleasant, obese  in no distress,  normal affect  ENT: No lesions,  mouth clear,  oropharynx clear, no postnasal drip  Neck: No JVD, no TMG, no carotid bruits  Lungs: No use of accessory muscles, no dullness  to percussion, distant bs   Cardiovascular: RRR, heart sounds normal, no murmur or gallops, no peripheral edema  Abdomen: soft and NT, no HSM,  BS normal  Musculoskeletal: No deformities, no cyanosis or clubbing  Neuro: alert, non focal  Skin: Warm, no lesions or rashes  No results found.     Assessment & Plan:   COPD (chronic obstructive pulmonary disease)Gold C Chronic obstructive lung disease gold stage C COPD stable at this time Plan Continue inhaled medications as prescribed     Updated Medication List Outpatient Encounter Prescriptions as of 06/15/2014  Medication Sig  . albuterol (PROVENTIL HFA;VENTOLIN HFA) 108 (90 BASE) MCG/ACT inhaler Inhale 2 puffs into the lungs every 6 (six) hours as needed for wheezing.   . baclofen (LIORESAL) 10 MG tablet Take 1 tablet by mouth 3 (three) times daily as needed for muscle spasms.   . budesonide-formoterol (SYMBICORT) 160-4.5 MCG/ACT inhaler Inhale 2 puffs into the lungs 2 (two) times daily.  . chlorpheniramine (CHLOR-TRIMETON) 4 MG tablet TAKE 1 TABLET (4 MG TOTAL) BY MOUTH 2 (TWO) TIMES DAILY AS NEEDED FOR ALLERGIES.  . citalopram (CELEXA) 20 MG tablet Take 40 mg by mouth daily.   . diclofenac sodium (VOLTAREN) 1 % GEL Apply 1 application topically 4 (four) times  daily. Applied to knees, shoulders, wrists, and ankles  . fexofenadine (ALLEGRA) 180 MG tablet Take 180 mg by mouth 2 (two) times daily.  . fluticasone (FLONASE) 50 MCG/ACT nasal spray PLACE 2 SPRAYS INTO BOTH NOSTRILS DAILY.  . furosemide (LASIX) 40 MG tablet TAKE 1 TABLET (40 MG TOTAL) BY MOUTH 2-3 times daily  . HYDROcodone-acetaminophen (NORCO/VICODIN) 5-325 MG per tablet Take 1-2 tablets by mouth every 4 (four) hours as needed.  Marland Kitchen KLOR-CON M20 20 MEQ tablet TAKE 1 TABLET (20 MEQ TOTAL) BY MOUTH 2 (TWO) TIMES DAILY.  Marland Kitchen levothyroxine (SYNTHROID, LEVOTHROID) 25 MCG tablet Take 25 mcg by mouth daily before breakfast.  . meloxicam (MOBIC) 15 MG tablet Take 15 mg by mouth  daily.  . metoprolol tartrate (LOPRESSOR) 25 MG tablet Take 1 tablet by mouth 2 (two) times daily.  . MOMETASONE FUROATE EX Apply 1 application topically as needed (For breakouts on legs).   Marland Kitchen tiotropium (SPIRIVA) 18 MCG inhalation capsule Place 1 capsule (18 mcg total) into inhaler and inhale daily.  . verapamil (CALAN) 120 MG tablet Take 120 mg by mouth 2 (two) times daily.  . mometasone-formoterol (DULERA) 200-5 MCG/ACT AERO Use dulera until supply runs out then stop and use symbicort  . [DISCONTINUED] amoxicillin-clavulanate (AUGMENTIN) 875-125 MG per tablet Take 1 tablet by mouth 2 (two) times daily. (Patient not taking: Reported on 06/15/2014)

## 2014-06-15 NOTE — Patient Instructions (Signed)
Stay on symbicort two puff twice daily, can use dulera in place of symbicort  No other changes Return 4 months

## 2014-06-16 NOTE — Assessment & Plan Note (Signed)
Chronic obstructive lung disease gold stage C COPD stable at this time Plan Continue inhaled medications as prescribed

## 2014-06-26 ENCOUNTER — Other Ambulatory Visit: Payer: Self-pay | Admitting: Adult Health

## 2014-10-17 ENCOUNTER — Ambulatory Visit: Payer: Medicare Other | Admitting: Adult Health

## 2014-10-24 ENCOUNTER — Other Ambulatory Visit: Payer: Self-pay | Admitting: Cardiology

## 2014-10-24 DIAGNOSIS — Z6841 Body Mass Index (BMI) 40.0 and over, adult: Secondary | ICD-10-CM | POA: Diagnosis not present

## 2014-10-24 DIAGNOSIS — M545 Low back pain: Secondary | ICD-10-CM | POA: Diagnosis not present

## 2014-10-24 DIAGNOSIS — Z1389 Encounter for screening for other disorder: Secondary | ICD-10-CM | POA: Diagnosis not present

## 2014-10-24 DIAGNOSIS — E669 Obesity, unspecified: Secondary | ICD-10-CM | POA: Diagnosis not present

## 2014-10-24 DIAGNOSIS — R0609 Other forms of dyspnea: Secondary | ICD-10-CM | POA: Diagnosis not present

## 2014-10-24 DIAGNOSIS — I10 Essential (primary) hypertension: Secondary | ICD-10-CM | POA: Diagnosis not present

## 2014-10-24 DIAGNOSIS — J449 Chronic obstructive pulmonary disease, unspecified: Secondary | ICD-10-CM | POA: Diagnosis not present

## 2014-10-24 DIAGNOSIS — E662 Morbid (severe) obesity with alveolar hypoventilation: Secondary | ICD-10-CM | POA: Diagnosis not present

## 2014-11-09 ENCOUNTER — Other Ambulatory Visit: Payer: Self-pay | Admitting: Critical Care Medicine

## 2014-11-21 ENCOUNTER — Other Ambulatory Visit: Payer: Self-pay | Admitting: Cardiology

## 2014-12-01 DIAGNOSIS — I10 Essential (primary) hypertension: Secondary | ICD-10-CM | POA: Diagnosis not present

## 2014-12-29 ENCOUNTER — Other Ambulatory Visit: Payer: Self-pay | Admitting: Critical Care Medicine

## 2014-12-29 ENCOUNTER — Other Ambulatory Visit: Payer: Self-pay | Admitting: Cardiology

## 2015-01-05 DIAGNOSIS — B0229 Other postherpetic nervous system involvement: Secondary | ICD-10-CM | POA: Diagnosis not present

## 2015-01-05 DIAGNOSIS — Z6841 Body Mass Index (BMI) 40.0 and over, adult: Secondary | ICD-10-CM | POA: Diagnosis not present

## 2015-01-06 ENCOUNTER — Other Ambulatory Visit: Payer: Self-pay | Admitting: Critical Care Medicine

## 2015-01-06 MED ORDER — FLUTICASONE PROPIONATE 50 MCG/ACT NA SUSP: NASAL | Status: DC

## 2015-01-06 NOTE — Telephone Encounter (Signed)
Paper refill request received from [facility] pharmacy for  Fluticasone prop 50 MCG Spray.  2 sprays once daily.  Last OV 06/15/14. Last Filled 04/22/14 with 5 refills.  Pt cancelled OV for 10/17/14. No Pending OV.  Will refill once, note to pharmacy needs OV for further refills.  Will sign under TP who last saw pt for OV.

## 2015-01-11 ENCOUNTER — Other Ambulatory Visit: Payer: Self-pay | Admitting: *Deleted

## 2015-01-11 NOTE — Telephone Encounter (Signed)
Error

## 2015-03-02 ENCOUNTER — Encounter (HOSPITAL_COMMUNITY): Payer: Self-pay

## 2015-03-02 ENCOUNTER — Inpatient Hospital Stay (HOSPITAL_COMMUNITY)
Admission: EM | Admit: 2015-03-02 | Discharge: 2015-03-05 | DRG: 291 | Disposition: A | Discharge status: Home / Self Care | Payer: Medicare Other | Attending: Internal Medicine | Admitting: Internal Medicine

## 2015-03-02 ENCOUNTER — Emergency Department (HOSPITAL_COMMUNITY): Payer: Medicare Other

## 2015-03-02 DIAGNOSIS — R05 Cough: Secondary | ICD-10-CM

## 2015-03-02 DIAGNOSIS — T380X5A Adverse effect of glucocorticoids and synthetic analogues, initial encounter: Secondary | ICD-10-CM | POA: Diagnosis present

## 2015-03-02 DIAGNOSIS — I110 Hypertensive heart disease with heart failure: Secondary | ICD-10-CM | POA: Diagnosis present

## 2015-03-02 DIAGNOSIS — F17210 Nicotine dependence, cigarettes, uncomplicated: Secondary | ICD-10-CM | POA: Diagnosis present

## 2015-03-02 DIAGNOSIS — Z23 Encounter for immunization: Secondary | ICD-10-CM | POA: Diagnosis not present

## 2015-03-02 DIAGNOSIS — Z9981 Dependence on supplemental oxygen: Secondary | ICD-10-CM

## 2015-03-02 DIAGNOSIS — E039 Hypothyroidism, unspecified: Secondary | ICD-10-CM | POA: Diagnosis present

## 2015-03-02 DIAGNOSIS — Z9119 Patient's noncompliance with other medical treatment and regimen: Secondary | ICD-10-CM | POA: Diagnosis not present

## 2015-03-02 DIAGNOSIS — E662 Morbid (severe) obesity with alveolar hypoventilation: Secondary | ICD-10-CM | POA: Diagnosis present

## 2015-03-02 DIAGNOSIS — Z825 Family history of asthma and other chronic lower respiratory diseases: Secondary | ICD-10-CM | POA: Diagnosis not present

## 2015-03-02 DIAGNOSIS — G4733 Obstructive sleep apnea (adult) (pediatric): Secondary | ICD-10-CM | POA: Diagnosis not present

## 2015-03-02 DIAGNOSIS — Z8249 Family history of ischemic heart disease and other diseases of the circulatory system: Secondary | ICD-10-CM | POA: Diagnosis not present

## 2015-03-02 DIAGNOSIS — Z9114 Patient's other noncompliance with medication regimen: Secondary | ICD-10-CM | POA: Diagnosis not present

## 2015-03-02 DIAGNOSIS — Z6841 Body Mass Index (BMI) 40.0 and over, adult: Secondary | ICD-10-CM | POA: Diagnosis not present

## 2015-03-02 DIAGNOSIS — R609 Edema, unspecified: Secondary | ICD-10-CM

## 2015-03-02 DIAGNOSIS — I10 Essential (primary) hypertension: Secondary | ICD-10-CM | POA: Diagnosis not present

## 2015-03-02 DIAGNOSIS — J9621 Acute and chronic respiratory failure with hypoxia: Secondary | ICD-10-CM | POA: Diagnosis present

## 2015-03-02 DIAGNOSIS — D72829 Elevated white blood cell count, unspecified: Secondary | ICD-10-CM | POA: Diagnosis present

## 2015-03-02 DIAGNOSIS — J441 Chronic obstructive pulmonary disease with (acute) exacerbation: Secondary | ICD-10-CM | POA: Diagnosis present

## 2015-03-02 DIAGNOSIS — I890 Lymphedema, not elsewhere classified: Secondary | ICD-10-CM | POA: Diagnosis present

## 2015-03-02 DIAGNOSIS — I5032 Chronic diastolic (congestive) heart failure: Secondary | ICD-10-CM | POA: Diagnosis present

## 2015-03-02 DIAGNOSIS — K219 Gastro-esophageal reflux disease without esophagitis: Secondary | ICD-10-CM | POA: Diagnosis present

## 2015-03-02 DIAGNOSIS — M79606 Pain in leg, unspecified: Secondary | ICD-10-CM | POA: Diagnosis not present

## 2015-03-02 DIAGNOSIS — R059 Cough, unspecified: Secondary | ICD-10-CM

## 2015-03-02 DIAGNOSIS — I5033 Acute on chronic diastolic (congestive) heart failure: Secondary | ICD-10-CM | POA: Diagnosis present

## 2015-03-02 DIAGNOSIS — R0602 Shortness of breath: Secondary | ICD-10-CM

## 2015-03-02 DIAGNOSIS — Z720 Tobacco use: Secondary | ICD-10-CM

## 2015-03-02 DIAGNOSIS — F329 Major depressive disorder, single episode, unspecified: Secondary | ICD-10-CM | POA: Diagnosis present

## 2015-03-02 LAB — CBC WITH DIFFERENTIAL/PLATELET
Basophils Absolute: 0 10*3/uL (ref 0.0–0.1)
Basophils Relative: 0 %
Eosinophils Absolute: 0.1 10*3/uL (ref 0.0–0.7)
Eosinophils Relative: 1 %
HCT: 41.8 % (ref 36.0–46.0)
Hemoglobin: 13.2 g/dL (ref 12.0–15.0)
Lymphocytes Relative: 11 %
Lymphs Abs: 1.7 10*3/uL (ref 0.7–4.0)
MCH: 28.7 pg (ref 26.0–34.0)
MCHC: 31.6 g/dL (ref 30.0–36.0)
MCV: 90.9 fL (ref 78.0–100.0)
Monocytes Absolute: 1.6 10*3/uL — ABNORMAL HIGH (ref 0.1–1.0)
Monocytes Relative: 11 %
Neutro Abs: 11.3 10*3/uL — ABNORMAL HIGH (ref 1.7–7.7)
Neutrophils Relative %: 77 %
Platelets: 190 10*3/uL (ref 150–400)
RBC: 4.6 MIL/uL (ref 3.87–5.11)
RDW: 15.3 % (ref 11.5–15.5)
WBC: 14.6 10*3/uL — ABNORMAL HIGH (ref 4.0–10.5)

## 2015-03-02 LAB — I-STAT ARTERIAL BLOOD GAS, ED
Acid-Base Excess: 6 mmol/L — ABNORMAL HIGH (ref 0.0–2.0)
Bicarbonate: 31.7 mEq/L — ABNORMAL HIGH (ref 20.0–24.0)
O2 Saturation: 95 %
TCO2: 33 mmol/L (ref 0–100)
pCO2 arterial: 47.1 mmHg — ABNORMAL HIGH (ref 35.0–45.0)
pH, Arterial: 7.436 (ref 7.350–7.450)
pO2, Arterial: 75 mmHg — ABNORMAL LOW (ref 80.0–100.0)

## 2015-03-02 LAB — BASIC METABOLIC PANEL
Anion gap: 12 (ref 5–15)
BUN: 7 mg/dL (ref 6–20)
CO2: 28 mmol/L (ref 22–32)
Calcium: 8.3 mg/dL — ABNORMAL LOW (ref 8.9–10.3)
Chloride: 97 mmol/L — ABNORMAL LOW (ref 101–111)
Creatinine, Ser: 0.81 mg/dL (ref 0.44–1.00)
GFR calc Af Amer: >60 mL/min (ref >60)
GFR calc non Af Amer: >60 mL/min (ref >60)
Glucose, Bld: 118 mg/dL — ABNORMAL HIGH (ref 65–99)
Potassium: 4.1 mmol/L (ref 3.5–5.1)
Sodium: 137 mmol/L (ref 135–145)

## 2015-03-02 LAB — BRAIN NATRIURETIC PEPTIDE: B Natriuretic Peptide: 119.7 pg/mL — ABNORMAL HIGH (ref 0.0–100.0)

## 2015-03-02 LAB — I-STAT TROPONIN, ED: Troponin i, poc: 0 ng/mL (ref 0.00–0.08)

## 2015-03-02 MED ORDER — FUROSEMIDE 80 MG PO TABS
80.0000 mg | ORAL_TABLET | Freq: Two times a day (BID) | ORAL | Status: DC
  Administered 2015-03-02 – 2015-03-03 (×2): 80 mg via ORAL
  Filled 2015-03-02 (×2): qty 1

## 2015-03-02 MED ORDER — VERAPAMIL HCL 120 MG PO TABS
120.0000 mg | ORAL_TABLET | Freq: Two times a day (BID) | ORAL | Status: DC
  Administered 2015-03-02 – 2015-03-05 (×6): 120 mg via ORAL
  Filled 2015-03-02 (×7): qty 1

## 2015-03-02 MED ORDER — ONDANSETRON HCL 4 MG/2ML IJ SOLN: 4.0000 mg | Freq: Four times a day (QID) | INTRAMUSCULAR | Status: DC | PRN

## 2015-03-02 MED ORDER — ENOXAPARIN SODIUM 80 MG/0.8ML ~~LOC~~ SOLN
70.0000 mg | SUBCUTANEOUS | Status: DC
  Administered 2015-03-02 – 2015-03-04 (×2): 70 mg via SUBCUTANEOUS
  Filled 2015-03-02 (×3): qty 0.8

## 2015-03-02 MED ORDER — HYDROCODONE-ACETAMINOPHEN 5-325 MG PO TABS
1.0000 | ORAL_TABLET | ORAL | Status: DC | PRN
  Administered 2015-03-03 – 2015-03-05 (×6): 2 via ORAL
  Filled 2015-03-02 (×6): qty 2

## 2015-03-02 MED ORDER — MELOXICAM 15 MG PO TABS
15.0000 mg | ORAL_TABLET | Freq: Every day | ORAL | Status: DC
  Administered 2015-03-02 – 2015-03-05 (×4): 15 mg via ORAL
  Filled 2015-03-02 (×4): qty 1

## 2015-03-02 MED ORDER — GUAIFENESIN-DM 100-10 MG/5ML PO SYRP
5.0000 mL | ORAL_SOLUTION | ORAL | Status: DC | PRN
  Administered 2015-03-04 (×2): 5 mL via ORAL
  Filled 2015-03-02 (×2): qty 5

## 2015-03-02 MED ORDER — LEVOTHYROXINE SODIUM 75 MCG PO TABS
150.0000 ug | ORAL_TABLET | Freq: Every day | ORAL | Status: DC
  Administered 2015-03-03 – 2015-03-05 (×3): 150 ug via ORAL
  Filled 2015-03-02 (×3): qty 2

## 2015-03-02 MED ORDER — DEXTROSE 5 % IV SOLN
1.0000 g | Freq: Once | INTRAVENOUS | Status: AC
  Administered 2015-03-02: 1 g via INTRAVENOUS
  Filled 2015-03-02: qty 10

## 2015-03-02 MED ORDER — IPRATROPIUM-ALBUTEROL 0.5-2.5 (3) MG/3ML IN SOLN
3.0000 mL | Freq: Four times a day (QID) | RESPIRATORY_TRACT | Status: DC
  Administered 2015-03-03 – 2015-03-05 (×9): 3 mL via RESPIRATORY_TRACT
  Filled 2015-03-02 (×9): qty 3

## 2015-03-02 MED ORDER — ACETAMINOPHEN 650 MG RE SUPP: 650.0000 mg | Freq: Four times a day (QID) | RECTAL | Status: DC | PRN

## 2015-03-02 MED ORDER — ALBUTEROL (5 MG/ML) CONTINUOUS INHALATION SOLN
10.0000 mg | INHALATION_SOLUTION | RESPIRATORY_TRACT | Status: AC
  Administered 2015-03-02: 10 mg via RESPIRATORY_TRACT
  Filled 2015-03-02: qty 20

## 2015-03-02 MED ORDER — METHYLPREDNISOLONE SODIUM SUCC 125 MG IJ SOLR
80.0000 mg | Freq: Three times a day (TID) | INTRAMUSCULAR | Status: DC
  Administered 2015-03-02 – 2015-03-04 (×5): 80 mg via INTRAVENOUS
  Filled 2015-03-02 (×5): qty 2

## 2015-03-02 MED ORDER — TAMSULOSIN HCL 0.4 MG PO CAPS
0.4000 mg | ORAL_CAPSULE | Freq: Every day | ORAL | Status: DC
  Administered 2015-03-02 – 2015-03-05 (×4): 0.4 mg via ORAL
  Filled 2015-03-02 (×4): qty 1

## 2015-03-02 MED ORDER — ONDANSETRON HCL 4 MG PO TABS: 4.0000 mg | ORAL_TABLET | Freq: Four times a day (QID) | ORAL | Status: DC | PRN

## 2015-03-02 MED ORDER — NICOTINE 21 MG/24HR TD PT24
21.0000 mg | MEDICATED_PATCH | Freq: Every day | TRANSDERMAL | Status: DC
  Administered 2015-03-02 – 2015-03-05 (×4): 21 mg via TRANSDERMAL
  Filled 2015-03-02 (×5): qty 1

## 2015-03-02 MED ORDER — PREGABALIN 25 MG PO CAPS
50.0000 mg | ORAL_CAPSULE | Freq: Every day | ORAL | Status: DC
  Administered 2015-03-02 – 2015-03-04 (×3): 50 mg via ORAL
  Filled 2015-03-02 (×3): qty 2

## 2015-03-02 MED ORDER — ENOXAPARIN SODIUM 40 MG/0.4ML ~~LOC~~ SOLN: 40.0000 mg | SUBCUTANEOUS | Status: DC

## 2015-03-02 MED ORDER — GUAIFENESIN-DM 100-10 MG/5ML PO SYRP: 5.0000 mL | ORAL_SOLUTION | ORAL | Status: DC | PRN

## 2015-03-02 MED ORDER — ACETAMINOPHEN 325 MG PO TABS
650.0000 mg | ORAL_TABLET | Freq: Once | ORAL | Status: AC
  Administered 2015-03-02: 650 mg via ORAL
  Filled 2015-03-02: qty 2

## 2015-03-02 MED ORDER — METHYLPREDNISOLONE SODIUM SUCC 125 MG IJ SOLR
125.0000 mg | Freq: Once | INTRAMUSCULAR | Status: AC
  Administered 2015-03-02: 125 mg via INTRAVENOUS
  Filled 2015-03-02: qty 2

## 2015-03-02 MED ORDER — DEXTROSE 5 % IV SOLN
500.0000 mg | Freq: Once | INTRAVENOUS | Status: AC
  Administered 2015-03-02: 500 mg via INTRAVENOUS
  Filled 2015-03-02: qty 500

## 2015-03-02 MED ORDER — POTASSIUM CHLORIDE CRYS ER 20 MEQ PO TBCR
20.0000 meq | EXTENDED_RELEASE_TABLET | Freq: Two times a day (BID) | ORAL | Status: DC
Start: 2015-03-02 — End: 2015-03-05
  Administered 2015-03-02 – 2015-03-05 (×5): 20 meq via ORAL
  Filled 2015-03-02 (×6): qty 1

## 2015-03-02 MED ORDER — ALBUTEROL SULFATE (2.5 MG/3ML) 0.083% IN NEBU
2.5000 mg | INHALATION_SOLUTION | RESPIRATORY_TRACT | Status: DC | PRN
  Administered 2015-03-03: 2.5 mg via RESPIRATORY_TRACT
  Filled 2015-03-02: qty 3

## 2015-03-02 MED ORDER — METOPROLOL TARTRATE 25 MG PO TABS
25.0000 mg | ORAL_TABLET | Freq: Two times a day (BID) | ORAL | Status: DC
Start: 2015-03-02 — End: 2015-03-05
  Administered 2015-03-02 – 2015-03-05 (×6): 25 mg via ORAL
  Filled 2015-03-02 (×6): qty 1

## 2015-03-02 MED ORDER — LEVOFLOXACIN IN D5W 750 MG/150ML IV SOLN
750.0000 mg | INTRAVENOUS | Status: DC
  Administered 2015-03-03 – 2015-03-04 (×2): 750 mg via INTRAVENOUS
  Filled 2015-03-02 (×2): qty 150

## 2015-03-02 MED ORDER — IPRATROPIUM-ALBUTEROL 0.5-2.5 (3) MG/3ML IN SOLN
3.0000 mL | RESPIRATORY_TRACT | Status: DC
  Administered 2015-03-02: 3 mL via RESPIRATORY_TRACT
  Filled 2015-03-02: qty 3

## 2015-03-02 MED ORDER — FUROSEMIDE 10 MG/ML IJ SOLN
40.0000 mg | INTRAMUSCULAR | Status: AC
  Administered 2015-03-02: 40 mg via INTRAVENOUS
  Filled 2015-03-02: qty 4

## 2015-03-02 MED ORDER — SODIUM CHLORIDE 0.9 % IJ SOLN
3.0000 mL | Freq: Two times a day (BID) | INTRAMUSCULAR | Status: DC
  Administered 2015-03-02 – 2015-03-05 (×5): 3 mL via INTRAVENOUS

## 2015-03-02 MED ORDER — DICLOFENAC SODIUM 1 % TD GEL
1.0000 "application " | Freq: Four times a day (QID) | TRANSDERMAL | Status: DC
  Administered 2015-03-02 – 2015-03-05 (×10): 1 via TOPICAL
  Filled 2015-03-02: qty 100

## 2015-03-02 MED ORDER — ACETAMINOPHEN 325 MG PO TABS: 650.0000 mg | ORAL_TABLET | Freq: Four times a day (QID) | ORAL | Status: DC | PRN

## 2015-03-02 NOTE — ED Notes (Signed)
Attempted report x1. 

## 2015-03-02 NOTE — H&P (Signed)
[facility] History and Physical  Morgan Odonnell [id number] DOB: 29-Apr-1950 DOA: 03/02/2015  Referring physician: Dr Noemi Chapel PCP: Donnajean Lopes, MD   Chief Complaint:  Shortness of breath worsened for the past 2 days  HPI:  64 year old morbidly obese female with history of chronic lymphedema, COPD gold C  with ongoing tobacco use and chronic respiratory failure on nighttime liters oxygen via nasal cannula, hypertension, chronic low back pain,? OSA who was brought to the hospital by family with worsening shortness of breath and wheezing for the past 2 days. Patient reports feeling increasingly short of breath for past few weeks but for the past 2 days she has been having extensive wheezing with cough associated with white mucus. She used one of her family members nebulizer without much help Patient denies headache, dizziness, fever, chills, nausea , vomiting, chest pain, palpitations,  abdominal pain, bowel or urinary symptoms. Reports poor appetite. Denies orthopnea or PND. Has a family continues to smoke half a pack to  1 pack of cigarettes a day. Also has been found to be more somnolent today. Denies any recent illness, sick contact or use of any new medications. Patient follows with Dr. Joya Gaskins for her COPD.  Course in the ED Patient had low-grade fever of 99.105F, mildly tachypneic, O2 sat dropped to 86%. Blood work showed LDL 14.6, normal hemoglobin and platelets. Chemistry was unremarkable. Chest x-ray showed mild-to-moderate CHF. ABG showed of 7.4 PO2 of 75, PCO2 of 47. Patient was given 125 mg IV Solu Medrol, 40 mg IV Lasix followed by IV Rocephin and azithromycin. Hospitalist admission requested to telemetry.  Review of systems : Gen.: Denies fever, chills, diaphoresis, appetite change and fatigue.  HEENT: Denies visual or hearing symptoms, congestion, throat pain, difficulty swallowing, neck pain or stiffness Respiratory: SOB, DOE, cough, wheezing, denies chest  tightness  Cardiovascular: Denies chest pain, palpitations , as chronic leg swellings, denies orthopnea, PND Gastrointestinal: Denies nausea, vomiting, abdominal pain, diarrhea, constipation, blood in stool and abdominal distention.  Genitourinary: Denies dysuria, urgency, frequency, hematuria, flank pain and difficulty urinating.  Endocrine: Denies: hot or cold intolerance,  polyuria, polydipsia. Musculoskeletal: chronic back pain , Denies myalgias,  joint swelling, arthralgias and gait problem.  Skin: Denies pallor, rash and wound.  Neurological: Denies dizziness, seizures, syncope, weakness, light-headedness, numbness and headaches.  Hematological: Denies adenopathy.  Psychiatric/Behavioral: Denies confusion   Past Medical History  Diagnosis Date  . Oral lesion   . Leg edema     chronic, bilateral  . Morbid obesity ([place])   . Chronic cough   . COPD (chronic obstructive pulmonary disease) ([place])   . Hypertension   . Low back pain   . Snoring   . GERD (gastroesophageal reflux disease)   . Heart murmur   . Shortness of breath    Past Surgical History  Procedure Laterality Date  . Meniscus repair    . Cholecystectomy    . Appendectomy    . Tubal ligation    . Ectopic pregnancy surgery    . Tonsillectomy    . Colonoscopy N/A 06/25/2013    Procedure: COLONOSCOPY;  Surgeon: [name], MD;  Location: [facility];  Service: Endoscopy;  Laterality: N/A;   Social History:  reports that she has been smoking Cigarettes.  She has a 67.5 pack-year smoking history. She does not have any smokeless tobacco history on file. She reports that she does not drink alcohol or use illicit drugs.  Allergies  Allergen Reactions  . Gabapentin     "  slept for 2 days"  . Novocain [Procaine] Other (See Comments)    intolerance    Family History  Problem Relation Age of Onset  . Cancer Mother     ovarian  . Cancer Brother     lymphomia  . Cancer Maternal Aunt     ovarian  . Lung disease  Mother   . Asthma Daughter   . Heart failure Mother     Prior to Admission medications   Medication Sig Start Date End Date Taking? Authorizing Provider  albuterol (PROVENTIL HFA;VENTOLIN HFA) 108 (90 BASE) MCG/ACT inhaler Inhale 2 puffs into the lungs every 6 (six) hours as needed for wheezing.    Yes Historical Provider, MD  baclofen (LIORESAL) 10 MG tablet Take 1 tablet by mouth 4 (four) times daily as needed for muscle spasms.    Yes Historical Provider, MD  citalopram (CELEXA) 40 MG tablet Take 40 mg by mouth daily. 02/11/15  Yes Historical Provider, MD  diclofenac sodium (VOLTAREN) 1 % GEL Apply 1 application topically 4 (four) times daily. Applied to knees, shoulders, wrists, and ankles   Yes Historical Provider, MD  fexofenadine (ALLEGRA) 60 MG tablet Take 60 mg by mouth 2 (two) times daily. 02/20/15  Yes Historical Provider, MD  furosemide (LASIX) 40 MG tablet TAKE TWO TABLETS (80MG ) IN MORNING AND ONE TABLET (40MG ) IN THE AFTERNOON   Yes Larey Dresser, MD  HYDROcodone-acetaminophen (NORCO/VICODIN) 5-325 MG per tablet Take 1-2 tablets by mouth every 4 (four) hours as needed. Patient taking differently: Take 1-2 tablets by mouth every 4 (four) hours as needed for moderate pain.  05/26/14  Yes William Dansie, PA-C  KLOR-CON M20 20 MEQ tablet TAKE 1 TABLET (20 MEQ TOTAL) BY MOUTH 2 (TWO) TIMES DAILY. 01/03/15  Yes Larey Dresser, MD  levothyroxine (SYNTHROID, LEVOTHROID) 150 MCG tablet Take 150 mcg by mouth daily.   Yes Historical Provider, MD  meloxicam (MOBIC) 15 MG tablet Take 15 mg by mouth daily.   Yes Historical Provider, MD  metoprolol tartrate (LOPRESSOR) 25 MG tablet Take 1 tablet by mouth 2 (two) times daily. 11/23/12  Yes Historical Provider, MD  mometasone-formoterol (DULERA) 200-5 MCG/ACT AERO Use dulera until supply runs out then stop and use symbicort Patient taking differently: Inhale 2 puffs into the lungs every 4 (four) hours as needed for wheezing.  06/15/14  Yes Elsie Stain, MD  pregabalin (LYRICA) 50 MG capsule Take 50 mg by mouth at bedtime.   Yes Historical Provider, MD  SPIRIVA HANDIHALER 18 MCG inhalation capsule PLACE 1 CAPSULE (18 MCG TOTAL) INTO INHALER AND INHALE DAILY. 06/27/14  Yes Tammy S Parrett, NP  SYMBICORT 160-4.5 MCG/ACT inhaler INHALE 2 PUFFS INTO THE LUNGS 2 (TWO) TIMES DAILY. 11/09/14  Yes Elsie Stain, MD  tamsulosin (FLOMAX) 0.4 MG CAPS capsule Take 0.4 mg by mouth daily.   Yes Historical Provider, MD  verapamil (CALAN) 120 MG tablet Take 120 mg by mouth 2 (two) times daily.   Yes Historical Provider, MD  chlorpheniramine (CHLOR-TRIMETON) 4 MG tablet TAKE 1 TABLET (4 MG TOTAL) BY MOUTH 2 (TWO) TIMES DAILY AS NEEDED FOR ALLERGIES. Patient not taking: Reported on 03/02/2015 04/22/14   Elsie Stain, MD  fluticasone (FLONASE) 50 MCG/ACT nasal spray PLACE 2 SPRAYS INTO BOTH NOSTRILS DAILY. Patient not taking: Reported on 03/02/2015 01/06/15   Melvenia Needles, NP     Physical Exam:  Filed Vitals:   03/02/15 1416 03/02/15 1430 03/02/15 1530 03/02/15 1600  BP: 138/64  132/68 111/60 117/55  Pulse: 93 92 88 87  Temp:      TempSrc:      Resp: 22 21 23 20   Height:      Weight:      SpO2: 93% 92% 92% 92%    Constitutional: Vital signs reviewed.  Elderly morbidly obese female lying in bed sleepy HEENT: no pallor, no icterus, moist oral mucosa, no cervical lymphadenopathy, no JVD Cardiovascular: RRR, S1 normal, S2 normal, no MRG Chest: Diffuse wheezing bilaterally, no crackles Abdominal: Soft. Non-tender, non-distended, bowel sounds are normal, Ext: warm, emphysema  Neurological: Somnolent but arousable and oriented, mild flapping tremors, nonfocal  Labs on Admission:  Basic Metabolic Panel:  Recent Labs Lab 03/02/15 1112  NA 137  K 4.1  CL 97*  CO2 28  GLUCOSE 118*  BUN 7  CREATININE 0.81  CALCIUM 8.3*   Liver Function Tests: No results for input(s): AST, ALT, ALKPHOS, BILITOT, PROT, ALBUMIN in the last 168  hours. No results for input(s): LIPASE, AMYLASE in the last 168 hours. No results for input(s): AMMONIA in the last 168 hours. CBC:  Recent Labs Lab 03/02/15 1112  WBC 14.6*  NEUTROABS 11.3*  HGB 13.2  HCT 41.8  MCV 90.9  PLT 190   Cardiac Enzymes: No results for input(s): CKTOTAL, CKMB, CKMBINDEX, TROPONINI in the last 168 hours. BNP: Invalid input(s): POCBNP CBG: No results for input(s): GLUCAP in the last 168 hours.  Radiological Exams on Admission: Dg Chest 2 View  03/02/2015  CLINICAL DATA:  Cough, shortness of breath for several weeks. EXAM: CHEST  2 VIEW COMPARISON:  05/26/2014 FINDINGS: Cardiomegaly with vascular congestion. Diffuse interstitial prominence compatible with interstitial edema. No visible effusions or acute bony abnormality. Study somewhat limited by body habitus. IMPRESSION: Mild to moderate CHF. Electronically Signed   By: Rolm Baptise M.D.   On: 03/02/2015 12:16    EKG: Normal Sinus rhythm at 90, T-wave flattening in lateral leads.  Assessment/Plan Principal Problem:  Acute on chronic hypoxic respiratory failure secondary to COPD with exacerbation (HCC) Admit to telemetry. IV Solu-Medrol 80 mg every 8 hours. Scheduled DuoNeb and when necessary albuterol. Antitussives. Empiric Levaquin. Patient drowsy on my exam with clinical concern for hypercarbic respiratory failure however ABG with mildly elevated CO2 only. Counseled strongly on smoking cessation. -Also obstructive sleep apnea is of concern. Will try CPAP at night. Recommend outpatient sleep study.  Active Problems:   Chronic diastolic CHF (congestive heart failure) (HCC) Chest x-ray on admission suggests mild-to-moderate CHF. Clinically does not appear volume overloaded and has chronic lymphedema. I've increased her Lasix dose to 80 mg twice a day (takes 80 mg in the morning and 40 mg in the evening) . monitor I/O and daily weights. Continue beta blocker.       Tobacco abuse Nicotine patch  ordered  Hypothyroidism Continue Synthroid  Depression Hold on Celexa. Minimize pain medications.   Chronic lymphedema  Morbid obesity      Diet:low sodium  DVT prophylaxis: sq lovenox   Code Status: full code Family Communication: Husband and children at bedside Disposition Plan: admit to [place], [place] [facility] Pager [phone]  Total time spent on admission :60 minutes  If 7PM-7AM, please contact night-coverage [email or web address] Password [facility] 03/02/2015, 4:37 PM

## 2015-03-02 NOTE — ED Notes (Signed)
Patient transported to X-ray 

## 2015-03-02 NOTE — ED Provider Notes (Signed)
The patient is a morbidly obese 64 year old female,  She has RAD and is wheezing diffusely - severe SOB, tachypnea Has no abd pain or worsening edema Sat's low on O2, needs xray, meds, labs Possible admission  Medical screening examination/treatment/procedure(s) were conducted as a shared visit with non-physician practitioner(s) and myself.  I personally evaluated the patient during the encounter.  Clinical Impression:   Final diagnoses:  Shortness of breath  Cough  Peripheral edema  Morbid obesity with BMI of 50.0-59.9, adult (HCC)  Chronic diastolic CHF (congestive heart failure) (HCC)         Noemi Chapel, MD 03/04/15 1531

## 2015-03-02 NOTE — ED Provider Notes (Signed)
CSN: [id number]     Arrival date & time 03/02/15  1056 History   First MD Initiated Contact with Patient 03/02/15 1057     Chief Complaint  Patient presents with  . Shortness of Breath     (Consider location/radiation/quality/duration/timing/severity/associated sxs/prior Treatment) Patient is a 64 y.o. female presenting with shortness of breath. The history is provided by the patient and medical records.  Shortness of Breath Associated symptoms: cough    64 year old female with history of chronic peripheral edema, chronic cough, COPD, hypertension, GERD, presenting to the ED for shortness of breath.  Patient has been having increased shortness of breath for the past several days. She reports a cough with discolored sputum. She denies any fever or chills. Denies chest pain.  She does have increased peripheral edema from her baseline.  She takes lasix and reports she has been compliant.  Patient does have history of COPD, continues to be a daily smoker. Patient is supposed to wear 2L PRN and nightly, was not wearing upon EMS arrival.  She was noted to have wheezes and was started on duoneb by EMS.    Past Medical History  Diagnosis Date  . Oral lesion   . Leg edema     chronic, bilateral  . Morbid obesity ([place])   . Chronic cough   . COPD (chronic obstructive pulmonary disease) ([place])   . Hypertension   . Low back pain   . Snoring   . GERD (gastroesophageal reflux disease)   . Heart murmur   . Shortness of breath    Past Surgical History  Procedure Laterality Date  . Meniscus repair    . Cholecystectomy    . Appendectomy    . Tubal ligation    . Ectopic pregnancy surgery    . Tonsillectomy    . Colonoscopy N/A 06/25/2013    Procedure: COLONOSCOPY;  Surgeon: [name], MD;  Location: [facility];  Service: Endoscopy;  Laterality: N/A;   Family History  Problem Relation Age of Onset  . Cancer Mother     ovarian  . Cancer Brother     lymphomia  . Cancer Maternal Aunt      ovarian  . Lung disease Mother   . Asthma Daughter   . Heart failure Mother    Social History  Substance Use Topics  . Smoking status: Current Every Day Smoker -- 1.50 packs/day for 45 years    Types: Cigarettes    Last Attempt to Quit: 02/09/2011  . Smokeless tobacco: None  . Alcohol Use: No   OB History    No data available     Review of Systems  Respiratory: Positive for cough and shortness of breath.   All other systems reviewed and are negative.     Allergies  Gabapentin  Home Medications   Prior to Admission medications   Medication Sig Start Date End Date Taking? Authorizing Provider  albuterol (PROVENTIL HFA;VENTOLIN HFA) 108 (90 BASE) MCG/ACT inhaler Inhale 2 puffs into the lungs every 6 (six) hours as needed for wheezing.     Historical Provider, MD  baclofen (LIORESAL) 10 MG tablet Take 1 tablet by mouth 3 (three) times daily as needed for muscle spasms.     Historical Provider, MD  chlorpheniramine (CHLOR-TRIMETON) 4 MG tablet TAKE 1 TABLET (4 MG TOTAL) BY MOUTH 2 (TWO) TIMES DAILY AS NEEDED FOR ALLERGIES. 04/22/14   Elsie Stain, MD  citalopram (CELEXA) 20 MG tablet Take 40 mg by mouth daily.  Historical Provider, MD  diclofenac sodium (VOLTAREN) 1 % GEL Apply 1 application topically 4 (four) times daily. Applied to knees, shoulders, wrists, and ankles    Historical Provider, MD  fexofenadine (ALLEGRA) 180 MG tablet Take 180 mg by mouth 2 (two) times daily.    Historical Provider, MD  fluticasone (FLONASE) 50 MCG/ACT nasal spray PLACE 2 SPRAYS INTO BOTH NOSTRILS DAILY. 01/06/15   Tammy S Parrett, NP  furosemide (LASIX) 40 MG tablet TAKE 1 TABLET (40 MG TOTAL) BY MOUTH 2-3 times daily    Larey Dresser, MD  HYDROcodone-acetaminophen (NORCO/VICODIN) 5-325 MG per tablet Take 1-2 tablets by mouth every 4 (four) hours as needed. 05/26/14   William Dansie, PA-C  KLOR-CON M20 20 MEQ tablet TAKE 1 TABLET (20 MEQ TOTAL) BY MOUTH 2 (TWO) TIMES DAILY. 01/03/15    Larey Dresser, MD  levothyroxine (SYNTHROID, LEVOTHROID) 25 MCG tablet Take 25 mcg by mouth daily before breakfast.    Historical Provider, MD  meloxicam (MOBIC) 15 MG tablet Take 15 mg by mouth daily.    Historical Provider, MD  metoprolol tartrate (LOPRESSOR) 25 MG tablet Take 1 tablet by mouth 2 (two) times daily. 11/23/12   Historical Provider, MD  MOMETASONE FUROATE EX Apply 1 application topically as needed (For breakouts on legs).     Historical Provider, MD  mometasone-formoterol [facility]) 200-5 MCG/ACT AERO Use dulera until supply runs out then stop and use symbicort 06/15/14   Elsie Stain, MD  SPIRIVA HANDIHALER 18 MCG inhalation capsule PLACE 1 CAPSULE (18 MCG TOTAL) INTO INHALER AND INHALE DAILY. 06/27/14   Tammy S Parrett, NP  SYMBICORT 160-4.5 MCG/ACT inhaler INHALE 2 PUFFS INTO THE LUNGS 2 (TWO) TIMES DAILY. 11/09/14   Elsie Stain, MD  verapamil (CALAN) 120 MG tablet Take 120 mg by mouth 2 (two) times daily.    Historical Provider, MD   BP 116/56 mmHg  Pulse 95  Temp(Src) 99.7 F (37.6 C) (Oral)  Resp 22  Ht 5\' 2"  (1.575 m)  Wt 136.533 kg  BMI 55.04 kg/m2  SpO2 92%   Physical Exam  Constitutional: She is oriented to person, place, and time. She appears well-developed and well-nourished. No distress.  Morbidly obese  HENT:  Head: Normocephalic and atraumatic.  Mouth/Throat: Oropharynx is clear and moist.  Eyes: Conjunctivae and EOM are normal. Pupils are equal, round, and reactive to light.  Neck: Normal range of motion. Neck supple.  Cardiovascular: Normal rate, regular rhythm and normal heart sounds.   Pulmonary/Chest: Accessory muscle usage present. Tachypnea noted. She has wheezes. She has rhonchi.  Intermixed wheezes and rhonchi upper lung fields, diminished at bases; retractions noted; able to speak in short sentences without difficulty  Abdominal: Soft. Bowel sounds are normal. There is no tenderness. There is no guarding.  Musculoskeletal: Normal range of  motion.  2+ edema BLE; chronic venous stasis changes present without induration, warmth to touch, or drainage  Neurological: She is alert and oriented to person, place, and time.  Skin: Skin is warm and dry. She is not diaphoretic.  Psychiatric: She has a normal mood and affect.  Nursing note and vitals reviewed.   ED Course  Procedures (including critical care time)  CRITICAL CARE Performed by: Larene Pickett   Total critical care time: 45 minutes  Critical care time was exclusive of separately billable procedures and treating other patients.  Critical care was necessary to treat or prevent imminent or life-threatening deterioration.  Critical care was time spent personally by  me on the following activities: development of treatment plan with patient and/or surrogate as well as nursing, discussions with consultants, evaluation of patient's response to treatment, examination of patient, obtaining history from patient or surrogate, ordering and performing treatments and interventions, ordering and review of laboratory studies, ordering and review of radiographic studies, pulse oximetry and re-evaluation of patient's condition.  Medications  albuterol (PROVENTIL,VENTOLIN) solution continuous neb (0 mg Nebulization Stopped 03/02/15 1303)  cefTRIAXone (ROCEPHIN) 1 g in dextrose 5 % 50 mL IVPB (not administered)  azithromycin (ZITHROMAX) 500 mg in dextrose 5 % 250 mL IVPB (not administered)  furosemide (LASIX) injection 40 mg (not administered)  methylPREDNISolone sodium succinate (SOLU-MEDROL) 125 mg/2 mL injection 125 mg (125 mg Intravenous Given 03/02/15 1118)  acetaminophen (TYLENOL) tablet 650 mg (650 mg Oral Given 03/02/15 1136)   Labs Review Labs Reviewed  CBC WITH DIFFERENTIAL/PLATELET - Abnormal; Notable for the following:    WBC 14.6 (*)    Neutro Abs 11.3 (*)    Monocytes Absolute 1.6 (*)    All other components within normal limits  BRAIN NATRIURETIC PEPTIDE - Abnormal;  Notable for the following:    B Natriuretic Peptide 119.7 (*)    All other components within normal limits  BASIC METABOLIC PANEL - Abnormal; Notable for the following:    Chloride 97 (*)    Glucose, Bld 118 (*)    Calcium 8.3 (*)    All other components within normal limits  I-STAT ARTERIAL BLOOD GAS, ED - Abnormal; Notable for the following:    pCO2 arterial 47.1 (*)    pO2, Arterial 75.0 (*)    Bicarbonate 31.7 (*)    Acid-Base Excess 6.0 (*)    All other components within normal limits  I-STAT TROPOININ, ED    Imaging Review Dg Chest 2 View  03/02/2015  CLINICAL DATA:  Cough, shortness of breath for several weeks. EXAM: CHEST  2 VIEW COMPARISON:  05/26/2014 FINDINGS: Cardiomegaly with vascular congestion. Diffuse interstitial prominence compatible with interstitial edema. No visible effusions or acute bony abnormality. Study somewhat limited by body habitus. IMPRESSION: Mild to moderate CHF. Electronically Signed   By: Rolm Baptise M.D.   On: 03/02/2015 12:16   I have personally reviewed and evaluated these images and lab results as part of my medical decision-making.   EKG Interpretation None      MDM   Final diagnoses:  Shortness of breath  Cough  Peripheral edema  Morbid obesity with BMI of 50.0-59.9, adult (HCC)  Chronic diastolic CHF (congestive heart failure) ([place])   64 year old female here with shortness of breath for the past few days. History of COPD, continues to be a daily smoker. Denies chest pain. She does have some increased peripheral edema despite taking her Lasix. Patient is afebrile, nontoxic in appearance. She does have labored breathing with retractions noted. She is able to speak in short sentences.  She admits to productive cough. Labs sent, CXR pending.  Patient given solu-medrol and hour long neb for now, will reassess.  1:16 PM Labwork as above-- leukocytosis noted at 14.6K. ABG appears baseline for patient when compared with prior. Chest x-ray is  very poor quality, read as mild to moderate CHF.  After solu-medrol and hour long neb patient remains tachypnea. She is only saturating 92% on her 2 L supplemental oxygen. She continues to have some mild retractions. At this time, I do not feel patient is stable enough for discharge. She will need admission for likely multifactorial SOB given her  CAP symptoms with leukocytosis, hx of COPD and continued wheezing, and probable mild CHF based on CXR and increased peripheral edema.  Patient started on rocephin and azithromycin, also given dose of lasix.  Patient admitted to hospitalist service for further management.  Larene Pickett, PA-C 03/02/15 [place], MD 03/04/15 [phone]

## 2015-03-02 NOTE — ED Notes (Signed)
Attempted report x 2 

## 2015-03-02 NOTE — ED Notes (Signed)
Pt. BIB EMS for complaint of SOB x 3-4 days. Pt. Denies CP. Pt. Has increased edema to bilateral lower extremities. Pt. With hx of COPD/current every day smoker. Pt. Found to have wheezing and diminished bases of lungs by EMS, given duoneb. Pt. Is supposed to wear 2L O2 at home, non compliant.

## 2015-03-03 ENCOUNTER — Inpatient Hospital Stay (HOSPITAL_COMMUNITY): Payer: Medicare Other

## 2015-03-03 DIAGNOSIS — M79606 Pain in leg, unspecified: Secondary | ICD-10-CM

## 2015-03-03 DIAGNOSIS — I10 Essential (primary) hypertension: Secondary | ICD-10-CM

## 2015-03-03 DIAGNOSIS — I5033 Acute on chronic diastolic (congestive) heart failure: Secondary | ICD-10-CM

## 2015-03-03 LAB — BASIC METABOLIC PANEL
Anion gap: 9 (ref 5–15)
BUN: 11 mg/dL (ref 6–20)
CO2: 31 mmol/L (ref 22–32)
Calcium: 8.9 mg/dL (ref 8.9–10.3)
Chloride: 98 mmol/L — ABNORMAL LOW (ref 101–111)
Creatinine, Ser: 0.81 mg/dL (ref 0.44–1.00)
GFR calc Af Amer: >60 mL/min (ref >60)
GFR calc non Af Amer: >60 mL/min (ref >60)
Glucose, Bld: 155 mg/dL — ABNORMAL HIGH (ref 65–99)
Potassium: 3.9 mmol/L (ref 3.5–5.1)
Sodium: 138 mmol/L (ref 135–145)

## 2015-03-03 LAB — CBC
HCT: 42.9 % (ref 36.0–46.0)
Hemoglobin: 13.4 g/dL (ref 12.0–15.0)
MCH: 28.4 pg (ref 26.0–34.0)
MCHC: 31.2 g/dL (ref 30.0–36.0)
MCV: 90.9 fL (ref 78.0–100.0)
Platelets: 220 10*3/uL (ref 150–400)
RBC: 4.72 MIL/uL (ref 3.87–5.11)
RDW: 15.1 % (ref 11.5–15.5)
WBC: 17.3 10*3/uL — ABNORMAL HIGH (ref 4.0–10.5)

## 2015-03-03 MED ORDER — FUROSEMIDE 10 MG/ML IJ SOLN
80.0000 mg | Freq: Two times a day (BID) | INTRAMUSCULAR | Status: DC
  Administered 2015-03-03 – 2015-03-04 (×2): 80 mg via INTRAVENOUS
  Filled 2015-03-03 (×2): qty 8

## 2015-03-03 NOTE — Progress Notes (Signed)
Utilization review completed. Quantasia Stegner, RN, BSN. 

## 2015-03-03 NOTE — Progress Notes (Signed)
  Echocardiogram 2D Echocardiogram has been performed.  Morgan Odonnell 03/03/2015, 4:13 PM

## 2015-03-03 NOTE — Progress Notes (Addendum)
PROGRESS NOTE  Morgan Odonnell [id number] DOB: 18-Dec-1950 DOA: 03/02/2015 PCP: Donnajean Lopes, MD  Brief History 64 year old female with a history of COPD, morbid obesity, continued tobacco abuse, OSA, hypertension, and lymphedema presented with 1 month history of shortness of breath that has been worse over the past 4 days prior to admission. The patient also complains of increasing abdominal girth and some increase in her lower extremity edema even above and beyond her usual lymphedema. She states that she has been frequently not taking her evening dose of furosemide because it was causing her to wake up at night to urinate. The patient complains of a cough with white mucus but denies any hemoptysis. She endorses compliance with her inhalers. She denies any fevers, chills, chest discomfort, nausea, vomiting, diarrhea, abdominal pain, dysuria, hematuria. After admission, the patient was started on intravenous IV soluMedrol and her oral furosemide was increased. Unfortunately, the patient has not had much relief in her dyspnea. chest x-ray revealed increased pulmonary vascular congestion  Assessment/Plan:  acute on chronic respiratory failure with hypoxia -Multifactorial including COPD exacerbation, OHS/OSA, CHF decompensation  -Presently stable on 2 L nasal cannula  -Continue bronchodilators  -Wean fraction saturation greater than 92%  -03/02/2015 ABG 7.43/47/75/33 (2L)  Acute on chronic diastolic CHF  -This is likely secondary to OSA/OHS with a degree of cor pulmonale  -Daily weights  -The patient has been somewhat noncompliant with her evening dose of furosemide  -Start intravenous furosemide and monitor renal function  -Repeat echocardiogram  COPD exacerbation/Tobacco Abuse -Unfortunately, the patient continues to smoke up to one pack per day -Tobacco cessation discussed -Continue intravenous Solu-Medrol -Continue levofloxacin -Continue bronchodilators Leg pain and  edema -venous duplex r/o DVT Hypothyroidism  -Continue Synthroid  Leukocytosis  -In part due to steroids  -The patient is afebrile and heme dynamically stable   Family Communication:   Pt at beside Disposition Plan:   Home 2-3 days      Procedures/Studies: Dg Chest 2 View  03/02/2015  CLINICAL DATA:  Cough, shortness of breath for several weeks. EXAM: CHEST  2 VIEW COMPARISON:  05/26/2014 FINDINGS: Cardiomegaly with vascular congestion. Diffuse interstitial prominence compatible with interstitial edema. No visible effusions or acute bony abnormality. Study somewhat limited by body habitus. IMPRESSION: Mild to moderate CHF. Electronically Signed   By: Rolm Baptise M.D.   On: 03/02/2015 12:16         Subjective:  patient continues to have nonproductive cough. Denies any hemoptysis. She states her shortness of breath has not significantly improved. Denies any chest discomfort, nausea, vomiting, diarrhea, and, pending no dysuria or hematuria.   Objective: Filed Vitals:   03/02/15 2108 03/02/15 2355 03/03/15 0123 03/03/15 0530  BP:  155/65  136/68  Pulse:  92  77  Temp:    97.4 F (36.3 C)  TempSrc:    Oral  Resp:  18    Height:      Weight:    143.427 kg (316 lb 3.2 oz)  SpO2: 94% 94% 95% 93%    Intake/Output Summary (Last 24 hours) at 03/03/15 1158 Last data filed at 03/03/15 0600  Gross per 24 hour  Intake    360 ml  Output   2050 ml  Net  -1690 ml   Weight change:  Exam:   General:  Pt is alert, follows commands appropriately, not in acute distress  HEENT: No icterus, No thrush, No neck mass, [place]/AT  Cardiovascular: RRR, S1/S2,  no rubs, no gallops  Respiratory: bilateral scattered crackles. Bilateral wheeze. Good air movement.   Abdomen: Soft/+BS, non tender, non distended, no guarding; no hepatosplenomegaly   Extremities: 2+LE edema, No lymphangitis, No petechiae, No rashes, no synovitis; clubbing without cyanosis daily  Data Reviewed: Basic Metabolic  Panel:  Recent Labs Lab 03/02/15 1112 03/03/15 0500  NA 137 138  K 4.1 3.9  CL 97* 98*  CO2 28 31  GLUCOSE 118* 155*  BUN 7 11  CREATININE 0.81 0.81  CALCIUM 8.3* 8.9   Liver Function Tests: No results for input(s): AST, ALT, ALKPHOS, BILITOT, PROT, ALBUMIN in the last 168 hours. No results for input(s): LIPASE, AMYLASE in the last 168 hours. No results for input(s): AMMONIA in the last 168 hours. CBC:  Recent Labs Lab 03/02/15 1112 03/03/15 0500  WBC 14.6* 17.3*  NEUTROABS 11.3*  --   HGB 13.2 13.4  HCT 41.8 42.9  MCV 90.9 90.9  PLT 190 220   Cardiac Enzymes: No results for input(s): CKTOTAL, CKMB, CKMBINDEX, TROPONINI in the last 168 hours. BNP: Invalid input(s): POCBNP CBG: No results for input(s): GLUCAP in the last 168 hours.  No results found for this or any previous visit (from the past 240 hour(s)).   Scheduled Meds: . diclofenac sodium  1 application Topical QID  . enoxaparin (LOVENOX) injection  70 mg Subcutaneous Q24H  . furosemide  80 mg Oral BID  . ipratropium-albuterol  3 mL Nebulization Q6H  . levofloxacin (LEVAQUIN) IV  750 mg Intravenous Q24H  . levothyroxine  150 mcg Oral QAC breakfast  . meloxicam  15 mg Oral Daily  . methylPREDNISolone (SOLU-MEDROL) injection  80 mg Intravenous 3 times per day  . metoprolol tartrate  25 mg Oral BID  . nicotine  21 mg Transdermal Daily  . potassium chloride SA  20 mEq Oral BID  . pregabalin  50 mg Oral QHS  . sodium chloride  3 mL Intravenous Q12H  . tamsulosin  0.4 mg Oral Daily  . verapamil  120 mg Oral BID   Continuous Infusions:    Brendia Dampier, DO  [facility] Pager [phone]  If 7PM-7AM, please contact night-coverage [email or web address] Password [email or web address] 03/03/2015, 11:58 AM   LOS: 1 day

## 2015-03-03 NOTE — Progress Notes (Signed)
Came to assess pt and see if she wanted to you CPAP machine. Pt refuse CPAP and stated that she does not use a CPAP at home. Also stated that she only uses Oxygen [place] to sleep with at night. So patient refuse CPAP. Stated to pt if change your mind to inform RN to notify RT. Family at bedside. Pt is stable at this time no distress or complications noted.

## 2015-03-04 ENCOUNTER — Inpatient Hospital Stay (HOSPITAL_COMMUNITY): Payer: Medicare Other

## 2015-03-04 DIAGNOSIS — M79606 Pain in leg, unspecified: Secondary | ICD-10-CM

## 2015-03-04 DIAGNOSIS — I5033 Acute on chronic diastolic (congestive) heart failure: Secondary | ICD-10-CM

## 2015-03-04 LAB — BASIC METABOLIC PANEL
Anion gap: 7 (ref 5–15)
BUN: 28 mg/dL — ABNORMAL HIGH (ref 6–20)
CO2: 30 mmol/L (ref 22–32)
Calcium: 9 mg/dL (ref 8.9–10.3)
Chloride: 97 mmol/L — ABNORMAL LOW (ref 101–111)
Creatinine, Ser: 1.2 mg/dL — ABNORMAL HIGH (ref 0.44–1.00)
GFR calc Af Amer: 55 mL/min — ABNORMAL LOW (ref >60)
GFR calc non Af Amer: 47 mL/min — ABNORMAL LOW (ref >60)
Glucose, Bld: 134 mg/dL — ABNORMAL HIGH (ref 65–99)
Potassium: 4.2 mmol/L (ref 3.5–5.1)
Sodium: 134 mmol/L — ABNORMAL LOW (ref 135–145)

## 2015-03-04 LAB — MAGNESIUM: Magnesium: 2.2 mg/dL (ref 1.7–2.4)

## 2015-03-04 MED ORDER — CEFUROXIME AXETIL 500 MG PO TABS
500.0000 mg | ORAL_TABLET | Freq: Two times a day (BID) | ORAL | Status: DC
  Administered 2015-03-05: 500 mg via ORAL
  Filled 2015-03-04: qty 1

## 2015-03-04 MED ORDER — FUROSEMIDE 10 MG/ML IJ SOLN
40.0000 mg | Freq: Two times a day (BID) | INTRAMUSCULAR | Status: DC
  Administered 2015-03-04 – 2015-03-05 (×2): 40 mg via INTRAVENOUS
  Filled 2015-03-04 (×2): qty 4

## 2015-03-04 MED ORDER — METHYLPREDNISOLONE SODIUM SUCC 40 MG IJ SOLR
40.0000 mg | Freq: Two times a day (BID) | INTRAMUSCULAR | Status: DC
Start: 2015-03-04 — End: 2015-03-05
  Administered 2015-03-04 – 2015-03-05 (×2): 40 mg via INTRAVENOUS
  Filled 2015-03-04 (×2): qty 1

## 2015-03-04 MED ORDER — AZITHROMYCIN 500 MG PO TABS
500.0000 mg | ORAL_TABLET | Freq: Every day | ORAL | Status: DC
  Administered 2015-03-05: 500 mg via ORAL
  Filled 2015-03-04: qty 1

## 2015-03-04 NOTE — Progress Notes (Signed)
VASCULAR LAB PRELIMINARY  PRELIMINARY  PRELIMINARY  PRELIMINARY  Bilateral lower extremity venous duplex completed.    Preliminary report:  There is no DVT or SVT noted in the bilateral lower extremities.   Coriana Angello, RVT 03/04/2015, 4:15 PM

## 2015-03-04 NOTE — Progress Notes (Signed)
PROGRESS NOTE  Morgan Odonnell [id number] DOB: 09-30-1950 DOA: 03/02/2015 PCP: Donnajean Lopes, MD  HPI/Recap of past 78 hours: 64 year old female with a history of COPD, morbid obesity, continued tobacco abuse, OSA, hypertension, and lymphedema presented with 1 month history of shortness of breath that has been worse over the past 4 days prior to admission. The patient also complains of increasing abdominal girth and some increase in her lower extremity edema even above and beyond her usual lymphedema. She states that she has been frequently not taking her evening dose of furosemide because it was causing her to wake up at night to urinate. The patient complains of a cough with white mucus but denies any hemoptysis. She endorses compliance with her inhalers. She denies any fevers, chills, chest discomfort, nausea, vomiting, diarrhea, abdominal pain, dysuria, hematuria. After admission, the patient was started on intravenous IV soluMedrol and her oral furosemide was increased  In the last 24 hours, patient feeling a little bit better. Feels like breathing is closer to baseline. She still has some cough.   Assessment/Plan: Principal Problem:   Acute on chronic respiratory failure with hypoxia secondary to COPD with exacerbation plus acute on chronic diastolic heart failure ([place]): Continue diuresis. Weaning down steroids. Patient was to be close to baseline. She normally wears oxygen at home at night only. We'll try to ambulate on room air and see what her oxygen saturations are. CHF secondary to cor pulmonale likely. Responding to Lasix. Active Problems:    Lymphedema   Morbid obesity with BMI of 50.0-59.9, adult [facility]): Patient meets criteria with BMI greater than 40   OSA (obstructive sleep apnea)    Tobacco abuse: Patient seems to be responding to nicotine patch    Code Status: Full code   Family Communication: No family present   Disposition Plan: Anticipate discharge tomorrow     Consultants:  None   Procedures:  Echocardiogram done 12/23: Unable to assess diastolic dysfunction. Preserved ejection fraction. Severely calcified mitral annulus   Antibiotics:  IV Rocephin 12/22-present  IV Zithromax 12/22-present    Objective: BP 123/68 mmHg  Pulse 78  Temp(Src) 97.9 F (36.6 C) (Oral)  Resp 16  Ht 5\' 2"  (1.575 m)  Wt 145.695 kg (321 lb 3.2 oz)  BMI 58.73 kg/m2  SpO2 94%  Intake/Output Summary (Last 24 hours) at 03/04/15 1509 Last data filed at 03/04/15 1347  Gross per 24 hour  Intake   1350 ml  Output   1500 ml  Net   -150 ml   Filed Weights   03/02/15 1826 03/03/15 0530 03/04/15 0628  Weight: 146.9 kg (323 lb 13.7 oz) 143.427 kg (316 lb 3.2 oz) 145.695 kg (321 lb 3.2 oz)    Exam:   General:  Alert and oriented 3   Cardiovascular: Regular rate and rhythm, [id number], 2/6 systolic ejection murmur   Respiratory: Decreased breath sounds throughout   Abdomen: Soft, obese, nontender, positive bowel sounds   Musculoskeletal: Trace pitting edema bilaterally    Data Reviewed: Basic Metabolic Panel:  Recent Labs Lab 03/02/15 1112 03/03/15 0500 03/04/15 0353  NA 137 138 134*  K 4.1 3.9 4.2  CL 97* 98* 97*  CO2 28 31 30   GLUCOSE 118* 155* 134*  BUN 7 11 28*  CREATININE 0.81 0.81 1.20*  CALCIUM 8.3* 8.9 9.0  MG  --   --  2.2   Liver Function Tests: No results for input(s): AST, ALT, ALKPHOS, BILITOT, PROT, ALBUMIN in the last 168 hours. No results  for input(s): LIPASE, AMYLASE in the last 168 hours. No results for input(s): AMMONIA in the last 168 hours. CBC:  Recent Labs Lab 03/02/15 1112 03/03/15 0500  WBC 14.6* 17.3*  NEUTROABS 11.3*  --   HGB 13.2 13.4  HCT 41.8 42.9  MCV 90.9 90.9  PLT 190 220   Cardiac Enzymes:   No results for input(s): CKTOTAL, CKMB, CKMBINDEX, TROPONINI in the last 168 hours. BNP (last 3 results)  Recent Labs  03/02/15 1112  BNP 119.7*    ProBNP (last 3 results) No results for  input(s): PROBNP in the last 8760 hours.  CBG: No results for input(s): GLUCAP in the last 168 hours.  No results found for this or any previous visit (from the past 240 hour(s)).   Studies: No results found.  Scheduled Meds: . [START ON 03/05/2015] azithromycin  500 mg Oral Daily  . [START ON 03/05/2015] cefUROXime  500 mg Oral BID WC  . diclofenac sodium  1 application Topical QID  . enoxaparin (LOVENOX) injection  70 mg Subcutaneous Q24H  . furosemide  40 mg Intravenous BID  . ipratropium-albuterol  3 mL Nebulization Q6H  . levothyroxine  150 mcg Oral QAC breakfast  . meloxicam  15 mg Oral Daily  . methylPREDNISolone (SOLU-MEDROL) injection  40 mg Intravenous Q12H  . metoprolol tartrate  25 mg Oral BID  . nicotine  21 mg Transdermal Daily  . potassium chloride SA  20 mEq Oral BID  . pregabalin  50 mg Oral QHS  . sodium chloride  3 mL Intravenous Q12H  . tamsulosin  0.4 mg Oral Daily  . verapamil  120 mg Oral BID    Continuous Infusions:    Time spent: 15 minutes  [facility] Pager [phone] . If 7PM-7AM, please contact night-coverage at [email or web address], password [facility] 03/04/2015, 3:09 PM  LOS: 2 days

## 2015-03-04 NOTE — Progress Notes (Signed)
SATURATION QUALIFICATIONS: (This note is used to comply with regulatory documentation for home oxygen)  Patient Saturations on Room Air at Rest = 94%  Patient Saturations on Room Air while Ambulating = 82%  Patient Saturations on 2 Liters of oxygen while Ambulating = 93%  Please briefly explain why patient needs home oxygen:

## 2015-03-05 LAB — BASIC METABOLIC PANEL
Anion gap: 10 (ref 5–15)
BUN: 38 mg/dL — ABNORMAL HIGH (ref 6–20)
CO2: 29 mmol/L (ref 22–32)
Calcium: 9 mg/dL (ref 8.9–10.3)
Chloride: 97 mmol/L — ABNORMAL LOW (ref 101–111)
Creatinine, Ser: 1.12 mg/dL — ABNORMAL HIGH (ref 0.44–1.00)
GFR calc Af Amer: 60 mL/min — ABNORMAL LOW (ref >60)
GFR calc non Af Amer: 52 mL/min — ABNORMAL LOW (ref >60)
Glucose, Bld: 125 mg/dL — ABNORMAL HIGH (ref 65–99)
Potassium: 4.1 mmol/L (ref 3.5–5.1)
Sodium: 136 mmol/L (ref 135–145)

## 2015-03-05 MED ORDER — NICOTINE 21 MG/24HR TD PT24: 21.0000 mg | MEDICATED_PATCH | Freq: Every day | TRANSDERMAL | Status: DC

## 2015-03-05 MED ORDER — AZITHROMYCIN 500 MG PO TABS: 500.0000 mg | ORAL_TABLET | Freq: Every day | ORAL | Status: DC

## 2015-03-05 MED ORDER — PREDNISONE 10 MG PO TABS: ORAL_TABLET | ORAL | Status: DC

## 2015-03-05 MED ORDER — GUAIFENESIN-DM 100-10 MG/5ML PO SYRP: 5.0000 mL | ORAL_SOLUTION | ORAL | Status: DC | PRN

## 2015-03-05 MED ORDER — CEFUROXIME AXETIL 500 MG PO TABS: 500.0000 mg | ORAL_TABLET | Freq: Two times a day (BID) | ORAL | Status: DC

## 2015-03-05 NOTE — Discharge Summary (Signed)
Discharge Summary  Morgan Odonnell [id number] DOB: [birth year]  PCP: Donnajean Lopes, MD  Admit date: 03/02/2015 Discharge date: 03/05/2015  Time spent: 25 minutes   Recommendations for Outpatient Follow-up:  1. Patient will now need oxygen 2 L nasal cannula continuously. Previously only daily at bedtime 2. New medication: Nicotine patch 21 mg topically daily  3. New medication: Prednisone taper 4. New medication: Zithromax 500 by mouth daily 3 days 5. New medication: Ceftin 500 mg by mouth twice a day 3 days  Discharge Diagnoses:  Active Hospital Problems   Diagnosis Date Noted  . COPD with exacerbation ([place]) 03/02/2015  . Acute on chronic diastolic CHF (congestive heart failure) ([place]) 03/03/2015  . OSA (obstructive sleep apnea) 03/02/2015  . Acute on chronic respiratory failure with hypoxia ([place]) 03/02/2015  . Tobacco abuse 03/02/2015  . Morbid obesity with BMI of 50.0-59.9, adult ([place]) 10/18/2013  . Lymphedema 01/26/2013  . Chronic diastolic CHF (congestive heart failure) ([place]) 01/04/2013    Resolved Hospital Problems   Diagnosis Date Noted Date Resolved  No resolved problems to display.    Discharge Condition: Improved, being discharged home   Diet recommendation: Heart healthy   Filed Weights   03/03/15 0530 03/04/15 0628 03/05/15 0525  Weight: 143.427 kg (316 lb 3.2 oz) 145.695 kg (321 lb 3.2 oz) 145.06 kg (319 lb 12.8 oz)    History of present illness:  64 year old female past history of ongoing tobacco abuse and secondary COPD plus morbid obesity presented to the emergency room on 12/22 with complaints of progressively worsening shortness of breath of the past month but severe over the last few days. Her usual lymphedema was noted to be even worse. Patient admits to being noncompliant with her evening dose of Lasix secondary to nighttime urination. She does continue to smoke. Patient found on admission to be hypoxic with oxygen saturations in the low  80s. Patient admitted for combination of CHF/COPD exacerbation. Started on antibiotics, oxygen, nebulizers, steroids and Lasix  Hospital Course:  Principal Problem:  Acute on chronic respiratory failure with hypoxia secondary to acute on chronic diastolic heart failure plus COPD with exacerbation ([place]): By 12/25, patient much improved. Steroids tapered down. Diuresed several liters. With ambulation, oxygen levels at 82% on room air improved to 93% with oxygen. Discussed this with the patient and she understands that her continued smoking, her lungs and worsened over time and she'll likely need chronic oxygen continuously. Active Problems:   Chronic diastolic CHF (congestive heart failure) ([place]): Responded well to Lasix. Discussed with the patient and she is to be compliant with her Lasix which she says she will be   Lymphedema   Morbid obesity with BMI of 50.0-59.9, adult [facility]): Patient meets criteria with BMI greater than 50   OSA (obstructive sleep apnea): Patient also noncompliant with CPAP. Using it here, she did feel better and she promises she will indeed use it. Home health will follow up with CPAP mask    Tobacco abuse: Counseled. Patient motivated to quit. Prescription given for nicotine patch      Procedures:  Echocardiogram done 12/23: Preserved ejection fraction. Severely calcified mitral annulus. Unable to assess diastolic dysfunction   Consultations:  None   Discharge Exam: BP 134/80 mmHg  Pulse 84  Temp(Src) 98.1 F (36.7 C) (Oral)  Resp 18  Ht 5\' 2"  (1.575 m)  Wt 145.06 kg (319 lb 12.8 oz)  BMI 58.48 kg/m2  SpO2 94%  General: Alert and oriented 3 Cardiovascular: Regular rate and rhythm, S1-S2  Respiratory: Decreased breath sounds throughout   Discharge Instructions You were cared for by a hospitalist during your hospital stay. If you have any questions about your discharge medications or the care you received while you were in the hospital after you are  discharged, you can call the unit and asked to speak with the hospitalist on call if the hospitalist that took care of you is not available. Once you are discharged, your primary care physician will handle any further medical issues. Please note that NO REFILLS for any discharge medications will be authorized once you are discharged, as it is imperative that you return to your primary care physician (or establish a relationship with a primary care physician if you do not have one) for your aftercare needs so that they can reassess your need for medications and monitor your lab values.  Discharge Instructions    Diet - low sodium heart healthy    Complete by:  As directed      Increase activity slowly    Complete by:  As directed             Medication List    TAKE these medications        albuterol 108 (90 BASE) MCG/ACT inhaler  Commonly known as:  PROVENTIL HFA;VENTOLIN HFA  Inhale 2 puffs into the lungs every 6 (six) hours as needed for wheezing.     azithromycin 500 MG tablet  Commonly known as:  ZITHROMAX  Take 1 tablet (500 mg total) by mouth daily.     baclofen 10 MG tablet  Commonly known as:  LIORESAL  Take 1 tablet by mouth 4 (four) times daily as needed for muscle spasms.     cefUROXime 500 MG tablet  Commonly known as:  CEFTIN  Take 1 tablet (500 mg total) by mouth 2 (two) times daily with a meal.     citalopram 40 MG tablet  Commonly known as:  CELEXA  Take 40 mg by mouth daily.     diclofenac sodium 1 % Gel  Commonly known as:  VOLTAREN  Apply 1 application topically 4 (four) times daily. Applied to knees, shoulders, wrists, and ankles     fexofenadine 60 MG tablet  Commonly known as:  ALLEGRA  Take 60 mg by mouth 2 (two) times daily.     furosemide 40 MG tablet  Commonly known as:  LASIX  TAKE TWO TABLETS (80MG ) IN MORNING AND ONE TABLET (40MG ) IN THE AFTERNOON     guaiFENesin-dextromethorphan 100-10 MG/5ML syrup  Commonly known as:  ROBITUSSIN DM  Take  5 mLs by mouth every 4 (four) hours as needed for cough.     HYDROcodone-acetaminophen 5-325 MG tablet  Commonly known as:  NORCO/VICODIN  Take 1-2 tablets by mouth every 4 (four) hours as needed.     KLOR-CON M20 20 MEQ tablet  Generic drug:  potassium chloride SA  TAKE 1 TABLET (20 MEQ TOTAL) BY MOUTH 2 (TWO) TIMES DAILY.     levothyroxine 150 MCG tablet  Commonly known as:  SYNTHROID, LEVOTHROID  Take 150 mcg by mouth daily.     meloxicam 15 MG tablet  Commonly known as:  MOBIC  Take 15 mg by mouth daily.     metoprolol tartrate 25 MG tablet  Commonly known as:  LOPRESSOR  Take 1 tablet by mouth 2 (two) times daily.     mometasone-formoterol 200-5 MCG/ACT Aero  Commonly known as:  DULERA  Use dulera until supply runs out then stop  and use symbicort     nicotine 21 mg/24hr patch  Commonly known as:  NICODERM CQ - dosed in mg/24 hours  Place 1 patch (21 mg total) onto the skin daily.     predniSONE 10 MG tablet  Commonly known as:  DELTASONE  40 mg po daily x 2 days, then 30mg  po daily x 2 days, then 20mg  x 2 days, then 10mg  x 2 days, then stop.     pregabalin 50 MG capsule  Commonly known as:  LYRICA  Take 50 mg by mouth at bedtime.     SPIRIVA HANDIHALER 18 MCG inhalation capsule  Generic drug:  tiotropium  PLACE 1 CAPSULE (18 MCG TOTAL) INTO INHALER AND INHALE DAILY.     SYMBICORT 160-4.5 MCG/ACT inhaler  Generic drug:  budesonide-formoterol  INHALE 2 PUFFS INTO THE LUNGS 2 (TWO) TIMES DAILY.     tamsulosin 0.4 MG Caps capsule  Commonly known as:  FLOMAX  Take 0.4 mg by mouth daily.     verapamil 120 MG tablet  Commonly known as:  CALAN  Take 120 mg by mouth 2 (two) times daily.       Allergies  Allergen Reactions  . Gabapentin     "slept for 2 days"  . Novocain [Procaine] Other (See Comments)    intolerance      The results of significant diagnostics from this hospitalization (including imaging, microbiology, ancillary and laboratory) are listed  below for reference.    Significant Diagnostic Studies: Dg Chest 2 View  03/02/2015  CLINICAL DATA:  Cough, shortness of breath for several weeks. EXAM: CHEST  2 VIEW COMPARISON:  05/26/2014 FINDINGS: Cardiomegaly with vascular congestion. Diffuse interstitial prominence compatible with interstitial edema. No visible effusions or acute bony abnormality. Study somewhat limited by body habitus. IMPRESSION: Mild to moderate CHF. Electronically Signed   By: Rolm Baptise M.D.   On: 03/02/2015 12:16    Microbiology: No results found for this or any previous visit (from the past 240 hour(s)).   Labs: Basic Metabolic Panel:  Recent Labs Lab 03/02/15 1112 03/03/15 0500 03/04/15 0353 03/05/15 0300  NA 137 138 134* 136  K 4.1 3.9 4.2 4.1  CL 97* 98* 97* 97*  CO2 28 31 30 29   GLUCOSE 118* 155* 134* 125*  BUN 7 11 28* 38*  CREATININE 0.81 0.81 1.20* 1.12*  CALCIUM 8.3* 8.9 9.0 9.0  MG  --   --  2.2  --    Liver Function Tests: No results for input(s): AST, ALT, ALKPHOS, BILITOT, PROT, ALBUMIN in the last 168 hours. No results for input(s): LIPASE, AMYLASE in the last 168 hours. No results for input(s): AMMONIA in the last 168 hours. CBC:  Recent Labs Lab 03/02/15 1112 03/03/15 0500  WBC 14.6* 17.3*  NEUTROABS 11.3*  --   HGB 13.2 13.4  HCT 41.8 42.9  MCV 90.9 90.9  PLT 190 220   Cardiac Enzymes: No results for input(s): CKTOTAL, CKMB, CKMBINDEX, TROPONINI in the last 168 hours. BNP: BNP (last 3 results)  Recent Labs  03/02/15 1112  BNP 119.7*    ProBNP (last 3 results) No results for input(s): PROBNP in the last 8760 hours.  CBG: No results for input(s): GLUCAP in the last 168 hours.     Signed:  Annita Brod  [facility] 03/05/2015, 4:33 PM

## 2015-03-05 NOTE — Progress Notes (Signed)
.   Oxygen tank delivered by [facility].Cpap machine will be delivered to her house in the morning.Patient discharged home accompanied by son.

## 2015-03-08 NOTE — Care Management Note (Signed)
Case Management Note  Patient Details  Name: Morgan Odonnell MRN: [id number] Date of Birth: [birth year]  Subjective/Objective:    Patient admitted with Respiratory failure hx COPD                 Action/Plan: Anticipate discharge today with home oxygen 2L continuous, patient presently on home oxygen at night received oxygen from [facility]. Patient requesting rolling walker, offered choice, selected [facility]. Merry Proud DME Liaison for [facility] notified will deliver to room prior to discharge. Patient made aware that equipment will be delivered prior to discharge.   Expected Discharge Date: 03/05/2015                  Expected Discharge Plan:  Home/Self Care  In-House Referral:     Discharge planning Services  CM Consult  Post Acute Care Choice:    Choice offered to:    patient  DME Arranged:  Walker rolling, Oxygen DME Agency:  [place]:    [place]:     Status of Service:     Medicare Important Message Given:    Date Medicare IM Given:    Medicare IM give by:    Date Additional Medicare IM Given:    Additional Medicare Important Message give by:     If discussed at [place] of Stay Meetings, dates discussed:    Additional CommentsLaurena Slimmer, RN 03/08/2015, 10:11 PM

## 2015-03-16 ENCOUNTER — Other Ambulatory Visit: Payer: Self-pay | Admitting: Adult Health

## 2015-03-16 DIAGNOSIS — Z1231 Encounter for screening mammogram for malignant neoplasm of breast: Secondary | ICD-10-CM | POA: Diagnosis not present

## 2015-03-23 ENCOUNTER — Other Ambulatory Visit: Payer: Self-pay | Admitting: Adult Health

## 2015-04-03 ENCOUNTER — Other Ambulatory Visit: Payer: Self-pay | Admitting: Adult Health

## 2015-04-03 MED ORDER — BUDESONIDE-FORMOTEROL FUMARATE 160-4.5 MCG/ACT IN AERO: INHALATION_SPRAY | RESPIRATORY_TRACT | Status: DC

## 2015-04-11 ENCOUNTER — Other Ambulatory Visit: Payer: Self-pay | Admitting: Adult Health

## 2015-04-11 MED ORDER — BUDESONIDE-FORMOTEROL FUMARATE 160-4.5 MCG/ACT IN AERO: INHALATION_SPRAY | RESPIRATORY_TRACT | Status: DC

## 2015-04-22 ENCOUNTER — Other Ambulatory Visit: Payer: Self-pay | Admitting: Adult Health

## 2015-05-01 ENCOUNTER — Emergency Department (HOSPITAL_COMMUNITY)
Admission: EM | Admit: 2015-05-01 | Discharge: 2015-05-01 | Disposition: A | Discharge status: Home / Self Care | Payer: PPO | Attending: Emergency Medicine | Admitting: Emergency Medicine

## 2015-05-01 ENCOUNTER — Emergency Department (HOSPITAL_COMMUNITY): Payer: PPO

## 2015-05-01 ENCOUNTER — Encounter (HOSPITAL_COMMUNITY): Payer: Self-pay | Admitting: Emergency Medicine

## 2015-05-01 DIAGNOSIS — R0602 Shortness of breath: Secondary | ICD-10-CM | POA: Diagnosis present

## 2015-05-01 DIAGNOSIS — I509 Heart failure, unspecified: Secondary | ICD-10-CM | POA: Insufficient documentation

## 2015-05-01 DIAGNOSIS — Z79899 Other long term (current) drug therapy: Secondary | ICD-10-CM | POA: Diagnosis not present

## 2015-05-01 DIAGNOSIS — Z792 Long term (current) use of antibiotics: Secondary | ICD-10-CM | POA: Diagnosis not present

## 2015-05-01 DIAGNOSIS — R600 Localized edema: Secondary | ICD-10-CM | POA: Diagnosis not present

## 2015-05-01 DIAGNOSIS — Z8719 Personal history of other diseases of the digestive system: Secondary | ICD-10-CM | POA: Diagnosis not present

## 2015-05-01 DIAGNOSIS — R011 Cardiac murmur, unspecified: Secondary | ICD-10-CM | POA: Diagnosis not present

## 2015-05-01 DIAGNOSIS — J441 Chronic obstructive pulmonary disease with (acute) exacerbation: Secondary | ICD-10-CM | POA: Diagnosis not present

## 2015-05-01 DIAGNOSIS — F17210 Nicotine dependence, cigarettes, uncomplicated: Secondary | ICD-10-CM | POA: Insufficient documentation

## 2015-05-01 DIAGNOSIS — R918 Other nonspecific abnormal finding of lung field: Secondary | ICD-10-CM | POA: Diagnosis not present

## 2015-05-01 DIAGNOSIS — I10 Essential (primary) hypertension: Secondary | ICD-10-CM | POA: Insufficient documentation

## 2015-05-01 DIAGNOSIS — Z7951 Long term (current) use of inhaled steroids: Secondary | ICD-10-CM | POA: Insufficient documentation

## 2015-05-01 LAB — I-STAT ARTERIAL BLOOD GAS, ED
Acid-Base Excess: 4 mmol/L — ABNORMAL HIGH (ref 0.0–2.0)
Bicarbonate: 30 mEq/L — ABNORMAL HIGH (ref 20.0–24.0)
O2 Saturation: 92 %
Patient temperature: 98.6
TCO2: 31 mmol/L (ref 0–100)
pCO2 arterial: 47.6 mmHg — ABNORMAL HIGH (ref 35.0–45.0)
pH, Arterial: 7.408 (ref 7.350–7.450)
pO2, Arterial: 64 mmHg — ABNORMAL LOW (ref 80.0–100.0)

## 2015-05-01 LAB — COMPREHENSIVE METABOLIC PANEL
ALT: 19 U/L (ref 14–54)
AST: 27 U/L (ref 15–41)
Albumin: 3.5 g/dL (ref 3.5–5.0)
Alkaline Phosphatase: 95 U/L (ref 38–126)
Anion gap: 14 (ref 5–15)
BUN: 9 mg/dL (ref 6–20)
CO2: 27 mmol/L (ref 22–32)
Calcium: 8.8 mg/dL — ABNORMAL LOW (ref 8.9–10.3)
Chloride: 99 mmol/L — ABNORMAL LOW (ref 101–111)
Creatinine, Ser: 0.93 mg/dL (ref 0.44–1.00)
GFR calc Af Amer: >60 mL/min (ref >60)
GFR calc non Af Amer: >60 mL/min (ref >60)
Glucose, Bld: 123 mg/dL — ABNORMAL HIGH (ref 65–99)
Potassium: 3.6 mmol/L (ref 3.5–5.1)
Sodium: 140 mmol/L (ref 135–145)
Total Bilirubin: 0.4 mg/dL (ref 0.3–1.2)
Total Protein: 7.6 g/dL (ref 6.5–8.1)

## 2015-05-01 LAB — CBC
HCT: 43.3 % (ref 36.0–46.0)
Hemoglobin: 13.5 g/dL (ref 12.0–15.0)
MCH: 27.8 pg (ref 26.0–34.0)
MCHC: 31.2 g/dL (ref 30.0–36.0)
MCV: 89.3 fL (ref 78.0–100.0)
Platelets: 187 10*3/uL (ref 150–400)
RBC: 4.85 MIL/uL (ref 3.87–5.11)
RDW: 15.3 % (ref 11.5–15.5)
WBC: 7.2 10*3/uL (ref 4.0–10.5)

## 2015-05-01 LAB — I-STAT CG4 LACTIC ACID, ED: Lactic Acid, Venous: 1.64 mmol/L (ref 0.5–2.0)

## 2015-05-01 LAB — I-STAT TROPONIN, ED: Troponin i, poc: 0 ng/mL (ref 0.00–0.08)

## 2015-05-01 MED ORDER — FUROSEMIDE 10 MG/ML IJ SOLN
80.0000 mg | Freq: Once | INTRAMUSCULAR | Status: AC
  Administered 2015-05-01: 80 mg via INTRAVENOUS
  Filled 2015-05-01: qty 8

## 2015-05-01 MED ORDER — ALBUTEROL (5 MG/ML) CONTINUOUS INHALATION SOLN
10.0000 mg/h | INHALATION_SOLUTION | Freq: Once | RESPIRATORY_TRACT | Status: AC
  Administered 2015-05-01: 10 mg/h via RESPIRATORY_TRACT
  Filled 2015-05-01: qty 20

## 2015-05-01 MED ORDER — METHYLPREDNISOLONE SODIUM SUCC 125 MG IJ SOLR
125.0000 mg | Freq: Once | INTRAMUSCULAR | Status: AC
Start: 2015-05-01 — End: 2015-05-01
  Administered 2015-05-01: 125 mg via INTRAVENOUS
  Filled 2015-05-01: qty 2

## 2015-05-01 MED ORDER — PREDNISONE 20 MG PO TABS
60.0000 mg | ORAL_TABLET | Freq: Once | ORAL | Status: AC
  Administered 2015-05-01: 60 mg via ORAL
  Filled 2015-05-01: qty 3

## 2015-05-01 MED ORDER — AZITHROMYCIN 250 MG PO TABS: ORAL_TABLET | ORAL | Status: DC

## 2015-05-01 MED ORDER — PREDNISONE 10 MG PO TABS: 20.0000 mg | ORAL_TABLET | Freq: Every day | ORAL | Status: DC

## 2015-05-01 MED ORDER — DEXTROSE 5 % IV SOLN
1.0000 g | Freq: Once | INTRAVENOUS | Status: AC
  Administered 2015-05-01: 1 g via INTRAVENOUS
  Filled 2015-05-01: qty 10

## 2015-05-01 MED ORDER — ALBUTEROL SULFATE (2.5 MG/3ML) 0.083% IN NEBU
5.0000 mg | INHALATION_SOLUTION | Freq: Once | RESPIRATORY_TRACT | Status: AC
  Administered 2015-05-01: 5 mg via RESPIRATORY_TRACT
  Filled 2015-05-01: qty 6

## 2015-05-01 MED ORDER — AZITHROMYCIN 250 MG PO TABS
1000.0000 mg | ORAL_TABLET | Freq: Once | ORAL | Status: AC
  Administered 2015-05-01: 1000 mg via ORAL
  Filled 2015-05-01: qty 4

## 2015-05-01 NOTE — ED Notes (Signed)
Patient transported to X-ray 

## 2015-05-01 NOTE — ED Notes (Addendum)
Onset one week ago intermittent productive cough green sputum with shortness of breath. On home oxygen 2L [place]. States left shoulder pain onset one week ago denies trauma currently 7/10 achy. Full sensation left arm radial pulse +2.  EMS administered albuterol prior to arrival states feels better with treatment.

## 2015-05-01 NOTE — ED Notes (Signed)
Spoke with EDP no blood cultures ordered.

## 2015-05-01 NOTE — ED Provider Notes (Signed)
CSN: [id number]     Arrival date & time 05/01/15  1211 History   First MD Initiated Contact with Patient 05/01/15 1304     Chief Complaint  Patient presents with  . Shortness of Breath     (Consider location/radiation/quality/duration/timing/severity/associated sxs/prior Treatment) HPI There is a 65 year old lady with a history of COPD and CHF who comes in today complaining she has had upper respiratory infection symptoms for the past week. She is on home oxygen at home. She has had some shoulder pain that increases with movement.  Past Medical History  Diagnosis Date  . Oral lesion   . Leg edema     chronic, bilateral  . Morbid obesity ([place])   . Chronic cough   . COPD (chronic obstructive pulmonary disease) ([place])   . Hypertension   . Low back pain   . Snoring   . GERD (gastroesophageal reflux disease)   . Heart murmur   . Shortness of breath    Past Surgical History  Procedure Laterality Date  . Meniscus repair    . Cholecystectomy    . Appendectomy    . Tubal ligation    . Ectopic pregnancy surgery    . Tonsillectomy    . Colonoscopy N/A 06/25/2013    Procedure: COLONOSCOPY;  Surgeon: [name], MD;  Location: [facility] ENDOSCOPY;  Service: Endoscopy;  Laterality: N/A;   Family History  Problem Relation Age of Onset  . Cancer Mother     ovarian  . Cancer Brother     lymphomia  . Cancer Maternal Aunt     ovarian  . Lung disease Mother   . Asthma Daughter   . Heart failure Mother    Social History  Substance Use Topics  . Smoking status: Current Every Day Smoker -- 1.50 packs/day for 45 years    Types: Cigarettes    Last Attempt to Quit: 02/09/2011  . Smokeless tobacco: None  . Alcohol Use: No   OB History    No data available     Review of Systems  All other systems reviewed and are negative.     Allergies  Gabapentin and Novocain  Home Medications   Prior to Admission medications   Medication Sig Start Date End Date Taking? Authorizing Provider   albuterol (PROVENTIL HFA;VENTOLIN HFA) 108 (90 BASE) MCG/ACT inhaler Inhale 2 puffs into the lungs every 6 (six) hours as needed for wheezing.     Historical Provider, MD  azithromycin (ZITHROMAX) 500 MG tablet Take 1 tablet (500 mg total) by mouth daily. 03/05/15   Annita Brod, MD  baclofen (LIORESAL) 10 MG tablet Take 1 tablet by mouth 4 (four) times daily as needed for muscle spasms.     Historical Provider, MD  budesonide-formoterol (SYMBICORT) 160-4.5 MCG/ACT inhaler INHALE 2 PUFFS INTO THE LUNGS 2 (TWO) TIMES DAILY. 04/11/15   Tammy S Parrett, NP  cefUROXime (CEFTIN) 500 MG tablet Take 1 tablet (500 mg total) by mouth 2 (two) times daily with a meal. 03/05/15   Annita Brod, MD  citalopram (CELEXA) 40 MG tablet Take 40 mg by mouth daily. 02/11/15   Historical Provider, MD  diclofenac sodium (VOLTAREN) 1 % GEL Apply 1 application topically 4 (four) times daily. Applied to knees, shoulders, wrists, and ankles    Historical Provider, MD  fexofenadine (ALLEGRA) 60 MG tablet Take 60 mg by mouth 2 (two) times daily. 02/20/15   Historical Provider, MD  fluticasone (FLONASE) 50 MCG/ACT nasal spray PLACE 2 SPRAYS INTO  BOTH NOSTRILS DAILY. 03/23/15   Tammy S Parrett, NP  furosemide (LASIX) 40 MG tablet TAKE TWO TABLETS (80MG ) IN MORNING AND ONE TABLET (40MG ) IN THE AFTERNOON    Larey Dresser, MD  guaiFENesin-dextromethorphan (ROBITUSSIN DM) 100-10 MG/5ML syrup Take 5 mLs by mouth every 4 (four) hours as needed for cough. 03/05/15   Annita Brod, MD  HYDROcodone-acetaminophen (NORCO/VICODIN) 5-325 MG per tablet Take 1-2 tablets by mouth every 4 (four) hours as needed. Patient taking differently: Take 1-2 tablets by mouth every 4 (four) hours as needed for moderate pain.  05/26/14   William Dansie, PA-C  KLOR-CON M20 20 MEQ tablet TAKE 1 TABLET (20 MEQ TOTAL) BY MOUTH 2 (TWO) TIMES DAILY. 01/03/15   Larey Dresser, MD  levothyroxine (SYNTHROID, LEVOTHROID) 150 MCG tablet Take 150 mcg by  mouth daily.    Historical Provider, MD  meloxicam (MOBIC) 15 MG tablet Take 15 mg by mouth daily.    Historical Provider, MD  metoprolol tartrate (LOPRESSOR) 25 MG tablet Take 1 tablet by mouth 2 (two) times daily. 11/23/12   Historical Provider, MD  mometasone-formoterol (DULERA) 200-5 MCG/ACT AERO Use dulera until supply runs out then stop and use symbicort Patient taking differently: Inhale 2 puffs into the lungs every 4 (four) hours as needed for wheezing.  06/15/14   Elsie Stain, MD  nicotine (NICODERM CQ - DOSED IN MG/24 HOURS) 21 mg/24hr patch Place 1 patch (21 mg total) onto the skin daily. 03/05/15   Annita Brod, MD  predniSONE (DELTASONE) 10 MG tablet 40 mg po daily x 2 days, then 30mg  po daily x 2 days, then 20mg  x 2 days, then 10mg  x 2 days, then stop. 03/05/15   Annita Brod, MD  pregabalin (LYRICA) 50 MG capsule Take 50 mg by mouth at bedtime.    Historical Provider, MD  SPIRIVA HANDIHALER 18 MCG inhalation capsule PLACE 1 CAPSULE (18 MCG TOTAL) INTO INHALER AND INHALE DAILY. 06/27/14   Tammy S Parrett, NP  tamsulosin (FLOMAX) 0.4 MG CAPS capsule Take 0.4 mg by mouth daily.    Historical Provider, MD  verapamil (CALAN) 120 MG tablet Take 120 mg by mouth 2 (two) times daily.    Historical Provider, MD   BP 120/59 mmHg  Pulse 80  Temp(Src) 99 F (37.2 C) (Oral)  Resp 20  Ht 5\' 2"  (1.575 m)  Wt 146.512 kg  BMI 59.06 kg/m2  SpO2 94% Physical Exam  Constitutional: She is oriented to person, place, and time. She appears well-developed. No distress.  Morbidly obese female  HENT:  Head: Normocephalic.  Eyes: Conjunctivae are normal.  Neck: Normal range of motion. Neck supple.  Cardiovascular: Normal rate and regular rhythm.   Pulmonary/Chest: Effort normal. She has no wheezes.  Decreased breath sounds at bases  Abdominal: Soft.  Musculoskeletal:  Changes of chronic edema  Neurological: She is alert and oriented to person, place, and time. She has normal reflexes.  No cranial nerve deficit. Coordination normal.  Skin: Skin is warm and dry.  Psychiatric: She has a normal mood and affect.  Nursing note and vitals reviewed.   ED Course  Procedures (including critical care time) Labs Review Labs Reviewed  BASIC METABOLIC PANEL  CBC  COMPREHENSIVE METABOLIC PANEL  BLOOD GAS, ARTERIAL  I-STAT TROPOININ, ED  I-STAT CG4 LACTIC ACID, ED    Imaging Review No results found. I have personally reviewed and evaluated these images and lab results as part of my medical decision-making.  EKG Interpretation   Date/Time:  Monday May 01 2015 12:18:23 EST Ventricular Rate:  77 PR Interval:  199 QRS Duration: 85 QT Interval:  411 QTC Calculation: 465 R Axis:   -19 Text Interpretation:  Sinus rhythm Inferior infarct, old No significant  change since last tracing Confirmed by Azeez Dunker MD, Andee Poles [phone]) on  05/01/2015 1:12:16 PM      MDM   Final diagnoses:  COPD exacerbation ([place])  Chronic congestive heart failure, unspecified congestive heart failure type [facility])     Patient feels improved after neb.  CXR with some volume overload.  Plan lasix here and antibiotics, steroids and continue bronchodilators.  She states she feels at baseline and feels that she can go home. I discussed return precautions and need for close follow-up and she voices understanding.    Pattricia Boss, MD 05/01/15 [phone]

## 2015-05-01 NOTE — ED Notes (Signed)
Attempted blood draw unable to draw blood. Notified phlebotomy.

## 2015-05-01 NOTE — Discharge Instructions (Signed)
Please increase your Lasix for the next 3 days for taking an additional pill each day. Call your cardiologist for recheck within this time. Take the prednisone and antibiotics and continue your albuterol. Return if you're worse at any time. Recheck with your primary care doctor in the next week.

## 2015-05-02 ENCOUNTER — Other Ambulatory Visit: Payer: Self-pay | Admitting: Adult Health

## 2015-05-04 ENCOUNTER — Other Ambulatory Visit (HOSPITAL_COMMUNITY): Payer: Self-pay | Admitting: *Deleted

## 2015-05-04 MED ORDER — POTASSIUM CHLORIDE CRYS ER 20 MEQ PO TBCR: EXTENDED_RELEASE_TABLET | ORAL | Status: DC

## 2015-05-10 ENCOUNTER — Other Ambulatory Visit: Payer: PPO

## 2015-05-10 ENCOUNTER — Ambulatory Visit (INDEPENDENT_AMBULATORY_CARE_PROVIDER_SITE_OTHER): Payer: PPO | Admitting: Pulmonary Disease

## 2015-05-10 ENCOUNTER — Encounter: Payer: Self-pay | Admitting: Pulmonary Disease

## 2015-05-10 VITALS — BP 124/74 | HR 75 | Ht 62.0 in | Wt 318.0 lb

## 2015-05-10 DIAGNOSIS — E662 Morbid (severe) obesity with alveolar hypoventilation: Secondary | ICD-10-CM | POA: Diagnosis not present

## 2015-05-10 DIAGNOSIS — J449 Chronic obstructive pulmonary disease, unspecified: Secondary | ICD-10-CM

## 2015-05-10 DIAGNOSIS — J9612 Chronic respiratory failure with hypercapnia: Secondary | ICD-10-CM

## 2015-05-10 DIAGNOSIS — J9621 Acute and chronic respiratory failure with hypoxia: Secondary | ICD-10-CM | POA: Insufficient documentation

## 2015-05-10 DIAGNOSIS — J9610 Chronic respiratory failure, unspecified whether with hypoxia or hypercapnia: Secondary | ICD-10-CM | POA: Insufficient documentation

## 2015-05-10 DIAGNOSIS — J9611 Chronic respiratory failure with hypoxia: Secondary | ICD-10-CM

## 2015-05-10 MED ORDER — AEROCHAMBER Z-STAT PLUS CHAMBR MISC: Status: AC

## 2015-05-10 NOTE — Progress Notes (Signed)
Subjective:    Patient ID: Morgan Odonnell, female    DOB: 11-Apr-1950, 65 y.o.   MRN: [id number]  C.C.:  Follow-up for COPD, Chronic Hypoxic Respiratory Failure, & OHV.  HPI COPD: Currently prescribed Spiriva & Symbicort. She is using a toilet paper roll with her Symbicort. She reports compliance. She uses her rescue inhaler less recently. Patient did have a respiratory illness in February that she attributed to an illness occuring at the same time as her grandchild. She still has an intermittent cough that is improving. She also reports improving dyspnea as well. She reports her chest tightness that has resolved. She was treated after going to the ED with Azithromycin & a course of Prednisone.   Chronic Hypoxic Respiratory Failure: Patient had desaturation on overnight oximetry performed in 2014 and was started on oxygen at 2 L/m with resolution of hypoxia while sleeping. Currently she is using 2 L/m 24 hours a day.   Obesity Hypoventilation Syndrome:  Polysomnogram in 2014 showed no OSA or periodic limb movements. Lowest saturation was 83% with 3:16:36 spent with saturation <88%.  Review of Systems  Previously had sweats that have resolved. No fever or chills. No nausea, emesis, or diarrhea.   Allergies  Allergen Reactions  . Gabapentin     "slept for 2 days"  . Novocain [Procaine] Other (See Comments)    intolerance    Current Outpatient Prescriptions on File Prior to Visit  Medication Sig Dispense Refill  . albuterol (PROVENTIL HFA;VENTOLIN HFA) 108 (90 BASE) MCG/ACT inhaler Inhale 2 puffs into the lungs every 6 (six) hours as needed for wheezing.     . baclofen (LIORESAL) 10 MG tablet Take 1 tablet by mouth 4 (four) times daily as needed for muscle spasms.     . budesonide-formoterol (SYMBICORT) 160-4.5 MCG/ACT inhaler INHALE 2 PUFFS INTO THE LUNGS 2 (TWO) TIMES DAILY. 10.2 Inhaler 2  . citalopram (CELEXA) 40 MG tablet Take 40 mg by mouth daily.  5  . diclofenac sodium  (VOLTAREN) 1 % GEL Apply 1 application topically 4 (four) times daily. Applied to knees, shoulders, wrists, and ankles    . furosemide (LASIX) 40 MG tablet TAKE TWO TABLETS (80MG ) IN MORNING AND ONE TABLET (40MG ) IN THE AFTERNOON    . HYDROcodone-acetaminophen (NORCO/VICODIN) 5-325 MG per tablet Take 1-2 tablets by mouth every 4 (four) hours as needed. (Patient taking differently: Take 1-2 tablets by mouth every 4 (four) hours as needed for moderate pain. ) 15 tablet 0  . levothyroxine (SYNTHROID, LEVOTHROID) 150 MCG tablet Take 150 mcg by mouth daily.    . meloxicam (MOBIC) 15 MG tablet Take 15 mg by mouth daily.    . metoprolol tartrate (LOPRESSOR) 25 MG tablet Take 1 tablet by mouth 2 (two) times daily.    . nicotine polacrilex (NICORETTE) 4 MG gum Take 4 mg by mouth as needed for smoking cessation.    . potassium chloride SA (KLOR-CON M20) 20 MEQ tablet TAKE 1 TABLET (20 MEQ TOTAL) BY MOUTH 2 (TWO) TIMES DAILY. 60 tablet 3  . pregabalin (LYRICA) 50 MG capsule Take 50 mg by mouth at bedtime.    Marland Kitchen SPIRIVA HANDIHALER 18 MCG inhalation capsule PLACE 1 CAPSULE (18 MCG TOTAL) INTO INHALER AND INHALE DAILY. 30 capsule 6  . tamsulosin (FLOMAX) 0.4 MG CAPS capsule Take 0.4 mg by mouth daily.    . verapamil (CALAN) 120 MG tablet Take 120 mg by mouth 2 (two) times daily.     No current facility-administered medications  on file prior to visit.    Past Medical History  Diagnosis Date  . Oral lesion   . Leg edema     chronic, bilateral  . Morbid obesity ([place])   . Chronic cough   . COPD (chronic obstructive pulmonary disease) ([place])   . Hypertension   . Low back pain   . Snoring   . GERD (gastroesophageal reflux disease)   . Heart murmur   . Shortness of breath   . Obesity hypoventilation syndrome [facility])     Past Surgical History  Procedure Laterality Date  . Meniscus repair    . Cholecystectomy    . Appendectomy    . Tubal ligation    . Ectopic pregnancy surgery    . Tonsillectomy    .  Colonoscopy N/A 06/25/2013    Procedure: COLONOSCOPY;  Surgeon: [name], MD;  Location: [facility] ENDOSCOPY;  Service: Endoscopy;  Laterality: N/A;    Family History  Problem Relation Age of Onset  . Cancer Mother     ovarian  . Lung disease Mother   . Heart failure Mother   . Cancer Brother     lymphoma  . Cancer Maternal Aunt     ovarian  . Asthma Daughter     Social History   Social History  . Marital Status: Married    Spouse Name: N/A  . Number of Children: N/A  . Years of Education: N/A   Social History Main Topics  . Smoking status: Former Smoker -- 2.00 packs/day for 45 years    Types: Cigarettes    Quit date: 04/12/2015  . Smokeless tobacco: None     Comment: Previously had quit for 3 years before restarting  . Alcohol Use: No  . Drug Use: No  . Sexual Activity: Not Asked   Other Topics Concern  . None   Social History Narrative   Was a Data processing manager Pulmonary:   Originally from [place]. She has also lived in [place], [place], & [place]. Prior travel to [place]. 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 No bird exposure. Questionable mold exposure. No hot tub exposure. Currently uses a ventless gas heater.       Objective:   Physical Exam BP 124/74 mmHg  Pulse 75  Ht 5\' 2"  (1.575 m)  Wt 318 lb (144.244 kg)  BMI 58.15 kg/m2  SpO2 97% General:  Awake. Alert. No acute distress. Morbidly obese.  Integument:  Warm & dry. No rash on exposed skin. No bruising. HEENT:  Moist mucus membranes. No oral ulcers. No scleral only mild injection or icterus.  Cardiovascular:  Regular rate. Trace lower extremity edema. No appreciable JVD.  Pulmonary:  Good aeration & clear to auscultation bilaterally. Symmetric chest wall expansion. No accessory muscle use on oxygen. Abdomen: Soft. Normal bowel sounds. Protuberant. Musculoskeletal:  Normal bulk and tone. No joint deformity or effusion appreciated.  PFT 06/24/12: FVC 1.60 L (86%) FEV1  1.11 L (53%) FEV1/FVC 0.70 FEF 25-75 0.63 L (25%) no bronchodilator response TLC 2.96 L (66%) RV 81% ERV 30% DLC uncorrected 52%  IMAGING CXR PA/LAT 05/01/15 (personally reviewed by me): Questionable bilateral lower lung opacities with some perihilar prominence. No obvious opacity on lateral projection. Borderline cardiomegaly. No pleural effusion appreciated. No nodule or mass appreciated.  LABS 05/01/15 ABG on RA:  7.41 / 48 / 64 / 92% CBC:7.2/13.5/43.3/187 BMP: 140/3.6/99/27/9/0.93/123/8.8 LFT:3.5/7.6/0.4/95/27/19 Lactic acid: 0.64 Troponin I: 0.00  Assessment & Plan:  65 year old female with underlying COPD as well as chronic hypercarbic and hypoxic respiratory failure secondary to obesity hypoventilation syndrome. Currently not on noninvasive positive pressure ventilation. Patient admits she has poor motivation for exercise and weight loss but has not yet tried pulmonary rehabilitation. She is willing to see a nutritionist and consult to help improve her dietary intake. She is continuing to use her inhaler regimen which I feel is helping her but I suspect that a true spacer will work better than a toilet paper roll. I instructed the patient to contact my office if she had any new breathing problems before her next appointment.  1. COPD: Continuing patient on Symbicort & Spiriva. Provided patient a spacer to use with her Symbicort. Screening for alpha-1 antitrypsin deficiency. Referring the patient to pulmonary rehabilitation as well as to a nutritionist for assistance with weight loss. Checking full pulmonary function testing on her before next appointment. Checking a 6 minute walk test oxygen titration at next appointment. 2. Chronic hypoxic respiratory failure: Checking 6 minute walk test beginning on room air at next appointment. Depending upon this test result she may benefit from a portable oxygen concentrator. 3. Chronic hypercarbic respiratory failure/obesity hypoventilation syndrome:  Patient currently not on noninvasive positive pressure ventilation. PCO2 was 48 on ABG from February. Attempting weight loss. 4. Health maintenance: Patient received Pneumovax in November 2014, Prevnar November 2015, & last influenza vaccine November 2015. Advised to obtain influenza vaccine from her primary care physician. 5. Follow-up: Patient to return to clinic 1-2 months or sooner if needed.  Sonia Baller Ashok Cordia, M.D. [facility] Pager:  [phone] After 3pm or if no response, call [phone] 3:06 PM 05/10/2015

## 2015-05-10 NOTE — Patient Instructions (Signed)
   Continue using her Symbicort and Spiriva. Try using her Symbicort with a spacer as this will be more effective.  I am referring you to pulmonary rehabilitation as well as to a nutritionist to help you with modifying her diet and achieving your goal weight loss.  We will do a breathing and walking test on her before your next appointment.  Please call my office if you have any new breathing problems before your next appointment.  I will see you back in 1-2 months.  TESTS ORDERED: 1. Full pulmonary function testing on her before next appointment 2. 6 minute walk test on room air with oxygen titration at next appointment 3. Pulmonary rehabilitation & nutritionist consults 4. Alpha-1 antitrypsin phenotype today

## 2015-05-17 LAB — ALPHA-1 ANTITRYPSIN PHENOTYPE: A-1 Antitrypsin: 165 mg/dL (ref 83–199)

## 2015-05-18 ENCOUNTER — Telehealth (HOSPITAL_COMMUNITY): Payer: Self-pay

## 2015-05-25 ENCOUNTER — Other Ambulatory Visit: Payer: Self-pay | Admitting: Adult Health

## 2015-06-13 ENCOUNTER — Telehealth: Payer: Self-pay | Admitting: Pulmonary Disease

## 2015-06-13 NOTE — Telephone Encounter (Signed)
Spoke with pt and she states that [employer] advised her that nutrition services are not covered by her ins. Also, she is not starting Pulm Rehab for about 6 weeks as she is going to stay with her daughter out of town. Nothing further needed.

## 2015-07-19 ENCOUNTER — Ambulatory Visit: Payer: PPO | Admitting: Pulmonary Disease

## 2015-07-19 ENCOUNTER — Ambulatory Visit: Payer: PPO

## 2015-08-01 ENCOUNTER — Other Ambulatory Visit: Payer: Self-pay

## 2015-08-03 MED ORDER — POTASSIUM CHLORIDE CRYS ER 20 MEQ PO TBCR: EXTENDED_RELEASE_TABLET | ORAL | Status: DC

## 2015-08-11 ENCOUNTER — Encounter: Payer: Self-pay | Admitting: *Deleted

## 2015-08-18 ENCOUNTER — Ambulatory Visit (INDEPENDENT_AMBULATORY_CARE_PROVIDER_SITE_OTHER): Payer: PPO | Admitting: Cardiology

## 2015-08-18 ENCOUNTER — Encounter: Payer: Self-pay | Admitting: Cardiology

## 2015-08-18 VITALS — BP 126/62 | HR 76 | Ht 62.0 in | Wt 335.5 lb

## 2015-08-18 DIAGNOSIS — I5032 Chronic diastolic (congestive) heart failure: Secondary | ICD-10-CM

## 2015-08-18 DIAGNOSIS — E662 Morbid (severe) obesity with alveolar hypoventilation: Secondary | ICD-10-CM

## 2015-08-18 MED ORDER — POTASSIUM CHLORIDE CRYS ER 20 MEQ PO TBCR: EXTENDED_RELEASE_TABLET | ORAL | Status: DC

## 2015-08-18 MED ORDER — FUROSEMIDE 40 MG PO TABS: ORAL_TABLET | ORAL | Status: DC

## 2015-08-18 NOTE — Patient Instructions (Signed)
Medication Instructions:  Increase lasix (furosemide) to 80mg  two times a day. This will be 2 of your 40mg  tablets two times a day.  Increase KCL (potassium) to 40 mEq in the AM and 20 mEq in the PM. This will be 2 of your 20 mEq tablets in the AM and 1 of your 20 mEq tablets in the PM  Labwork: BMET/BNP in 2 weeks.  Testing/Procedures: None   Follow-Up: Your physician recommends that you schedule a follow-up appointment with Dr Aundra Dubin in 1 month in the [facility] at [facility].   .     If you need a refill on your cardiac medications before your next appointment, please call your pharmacy.

## 2015-08-20 NOTE — Progress Notes (Signed)
Patient ID: Morgan Odonnell, female   DOB: 02-25-1951, 65 y.o.   MRN: [id number] PCP: Dr. Philip Aspen Orthopedic: Dr Sharol Given  Cardiology: Dr. Aundra Dubin  65 yo with history of COPD and OHS presents for evaluation for CHF.  She has a long history of COPD and OHS and is followed by [facility].  I have not seen her in a couple of years.  She had a sleep study and did not have OSA, but wears oxygen all the time now.  She sleeps a lot.  She is short of breath just walking around the house.  Very limited. No orthopnea/PND.  No chest pain.  No palpitations.  She is no longer wrapping her legs.   ECG: NSR, 1st degree AVB, low voltage, poor RWP.   Labs 12/07/12 K 4.4 Creatinine 1.1  Labs 12/17/12 K 4.7 creatinine 1.3, BNP 147 Labs 2/17 K 3.6, creatinine 0.93, hgb 13.5  PMH: 1. COPD: PFTs (4/14) with FEV1 57%, ratio 70%.  2. Obesity 3. OHS: currently has home oxygen.  Sleep study negative for OSA in 2014.  4. HTN 5. Chronic leg edema.  6. Chronic systolic CHF: echo (Q000111Q) with EF 60-65%, moderate LAE - Echo (12/16) with EF 55-60%, mild LVH.   SH: Married, retired Chief Operating Officer, originally from [place].  Prior heavy smoker, quit 12/12.   FH: Mother with CHF.   ROS: All systems reviewed and negative except as per HPI.   Current Outpatient Prescriptions  Medication Sig Dispense Refill  . albuterol (PROVENTIL HFA;VENTOLIN HFA) 108 (90 BASE) MCG/ACT inhaler Inhale 2 puffs into the lungs every 6 (six) hours as needed for wheezing.     . baclofen (LIORESAL) 10 MG tablet Take 1 tablet by mouth 4 (four) times daily as needed for muscle spasms.     . citalopram (CELEXA) 40 MG tablet Take 40 mg by mouth daily.  5  . diclofenac sodium (VOLTAREN) 1 % GEL Apply 1 application topically 4 (four) times daily. Applied to knees, shoulders, wrists, and ankles    . HYDROcodone-acetaminophen (NORCO/VICODIN) 5-325 MG per tablet Take 1-2 tablets by mouth every 4 (four) hours as needed. (Patient taking differently: Take 1-2  tablets by mouth every 4 (four) hours as needed for moderate pain. ) 15 tablet 0  . levothyroxine (SYNTHROID, LEVOTHROID) 150 MCG tablet Take 150 mcg by mouth daily.    . metoprolol tartrate (LOPRESSOR) 25 MG tablet Take 1 tablet by mouth 2 (two) times daily.    . nicotine polacrilex (NICORETTE) 4 MG gum Take 4 mg by mouth as needed for smoking cessation.    . pregabalin (LYRICA) 50 MG capsule Take 50 mg by mouth at bedtime.    Marland Kitchen Spacer/Aero-Holding Chambers (AEROCHAMBER Z-STAT PLUS CHAMBR) MISC Use as directed 1 each 0  . SPIRIVA HANDIHALER 18 MCG inhalation capsule PLACE 1 CAPSULE (18 MCG TOTAL) INTO INHALER AND INHALE DAILY. 30 capsule 6  . SYMBICORT 160-4.5 MCG/ACT inhaler INHALE TWO puffs into lung TWICE DAILY 10.2 g 5  . verapamil (CALAN) 120 MG tablet Take 120 mg by mouth 2 (two) times daily.    . furosemide (LASIX) 40 MG tablet 2 tablets (80mg ) by mouth two times a day 120 tablet 1  . potassium chloride SA (K-DUR,KLOR-CON) 20 MEQ tablet 2 tablets (40 mEq) by mouth in the AM and 1 tablet (20 mEq) by mouth in the PM 90 tablet 1  . tamsulosin (FLOMAX) 0.4 MG CAPS capsule Take 0.4 mg by mouth daily. Reported on 08/18/2015  No current facility-administered medications for this visit.    BP 126/62 mmHg  Pulse 76  Ht 5\' 2"  (1.575 m)  Wt 335 lb 8 oz (152.182 kg)  BMI 61.35 kg/m2  SpO2 95% General: Obese, NAD Neck: Thick, JVP 8-9 cm, no thyromegaly or thyroid nodule.  Lungs: Decreased breath sounds at bases bilaterally.  CV: Nondisplaced PMI.  Heart regular S1/S2, no S3/S4, no murmur. 2+ chronic edema to knees.  No carotid bruit.  Unable to palpate pedal pulses in the setting of significant edema.  Abdomen: Soft, nontender, no hepatosplenomegaly, no distention.  Skin: Intact without lesions or rashes.  Neurologic: Alert and oriented x 3.  Psych: Normal affect. Extremities: No clubbing or cyanosis.  HEENT: Normal.   Assessment/Plan: 1. Chronic diastolic CHF: Echo in Q000111Q with EF  55-60%.  Suspect component of RV failure in setting of OHS.  NYHA class IIIb symptoms (chronic), some volume overload on exam.  - Increase Lasix to 80 mg bid. BMET/BNP today and again in 2 wks. - Increase KCl to 40 qam/20 qpm.  2. Obesity: Discussed portion control. Weight loss is going to be imperative.  Bariatric surgery would be difficult, think she could do poorly with anesthesia.  3. OHS: Continue home oxygen.  Surprisingly, no OSA.   Loralie Champagne 08/20/2015

## 2015-08-21 ENCOUNTER — Telehealth: Payer: Self-pay | Admitting: Cardiology

## 2015-08-21 NOTE — Telephone Encounter (Signed)
Spoke with the pt  She is asking if [employer] can order her a more portable o2 system  She wants to try the [employer] Go  She is aware she needs to reschedule her 6MW, PFT and ov  Please advise thanks

## 2015-08-21 NOTE — Telephone Encounter (Signed)
Pt confirmed that Dr Ashok Cordia has been ordering and managing oxygen and oxygen equipment for her.  Pt advised I will forward to Dr Ashok Cordia at [facility] to follow up with her about oxygen and oxygen equipment she is requesting.

## 2015-08-21 NOTE — Telephone Encounter (Signed)
New message       The pt was asked from the doctor to get the name of the company for her concentrator, it's called "[employer]" and the fax number according to the pt is [phone].  The pt states the doctor told her he would fax in the paper work for the concentrator.

## 2015-08-22 NOTE — Telephone Encounter (Signed)
We can certainly assess her portable oxygen needs but as noted she needs a 6MWT on room air at her next office visit to assess how much oxygen she will require with ambulation.

## 2015-08-22 NOTE — Telephone Encounter (Signed)
Spoke with the pt and notified of recs per JN  She verbalized understanding  She will call back to schedule her appts She states right now is too early in the am for her

## 2015-09-01 ENCOUNTER — Other Ambulatory Visit (HOSPITAL_COMMUNITY)
Admit: 2015-09-01 | Discharge: 2015-09-01 | Disposition: A | Discharge status: Home / Self Care | Payer: PPO | Attending: Cardiology | Admitting: Cardiology

## 2015-09-01 DIAGNOSIS — I5032 Chronic diastolic (congestive) heart failure: Secondary | ICD-10-CM | POA: Diagnosis present

## 2015-09-01 LAB — BASIC METABOLIC PANEL
Anion gap: 9 (ref 5–15)
BUN: 12 mg/dL (ref 6–20)
CO2: 27 mmol/L (ref 22–32)
Calcium: 8.9 mg/dL (ref 8.9–10.3)
Chloride: 102 mmol/L (ref 101–111)
Creatinine, Ser: 0.93 mg/dL (ref 0.44–1.00)
GFR calc Af Amer: >60 mL/min (ref >60)
GFR calc non Af Amer: >60 mL/min (ref >60)
Glucose, Bld: 137 mg/dL — ABNORMAL HIGH (ref 65–99)
Potassium: 3.9 mmol/L (ref 3.5–5.1)
Sodium: 138 mmol/L (ref 135–145)

## 2015-09-01 LAB — BRAIN NATRIURETIC PEPTIDE: B Natriuretic Peptide: 122.7 pg/mL — ABNORMAL HIGH (ref 0.0–100.0)

## 2015-09-05 ENCOUNTER — Ambulatory Visit (INDEPENDENT_AMBULATORY_CARE_PROVIDER_SITE_OTHER): Payer: PPO | Admitting: Pulmonary Disease

## 2015-09-05 ENCOUNTER — Encounter: Payer: Self-pay | Admitting: Pulmonary Disease

## 2015-09-05 VITALS — BP 126/74 | HR 73 | Ht 62.0 in | Wt 322.6 lb

## 2015-09-05 DIAGNOSIS — K219 Gastro-esophageal reflux disease without esophagitis: Secondary | ICD-10-CM

## 2015-09-05 DIAGNOSIS — J9612 Chronic respiratory failure with hypercapnia: Secondary | ICD-10-CM | POA: Diagnosis not present

## 2015-09-05 DIAGNOSIS — E662 Morbid (severe) obesity with alveolar hypoventilation: Secondary | ICD-10-CM | POA: Diagnosis not present

## 2015-09-05 DIAGNOSIS — J9621 Acute and chronic respiratory failure with hypoxia: Secondary | ICD-10-CM | POA: Diagnosis not present

## 2015-09-05 DIAGNOSIS — K449 Diaphragmatic hernia without obstruction or gangrene: Secondary | ICD-10-CM

## 2015-09-05 DIAGNOSIS — J449 Chronic obstructive pulmonary disease, unspecified: Secondary | ICD-10-CM | POA: Diagnosis not present

## 2015-09-05 MED ORDER — RANITIDINE HCL 150 MG PO TABS: 150.0000 mg | ORAL_TABLET | Freq: Every day | ORAL | Status: DC

## 2015-09-05 NOTE — Progress Notes (Signed)
Subjective:    Patient ID: Morgan Odonnell, female    DOB: Nov 05, 1950, 65 y.o.   MRN: [id number]  C.C.:  Follow-up for COPD, Chronic Hypoxic Respiratory Failure, & Chronic Hypercarbic Respiratory Failure/OHV.  HPI COPD: Refuse to have pulmonary function testing rescheduled. Patient referred to pulmonary rehabilitation at last appointment. In spacer at last appointment to use with Symbicort. Reports her dyspnea is at baseline. She reports she is using her rescue medications once or twice a month. Denies any coughing or wheezing. Compliant with Spiriva and Symbicort. She has been treated with Prednisone a couple of times since last visit due to respiratory infection from her grandchild.   Chronic Hypoxic Respiratory Failure: Prescribed oxygen at 2 L/m while sleeping. Patient refused to have 6 minute walk test rescheduled. She reports her Lasix was recently increased.   Chronic Hypercarbic Respiratory Failure/Obesity Hypoventilation Syndrome:  Polysomnogram in 2014 showed no OSA or periodic limb movements. Lowest saturation was 83% with 3:16:36 spent with saturation <88%. Patient referred to nutritionist at last appointment for weight loss. Not currently on noninvasive positive pressure ventilation. She is interested in gastric bypass.   Review of Systems  No chest pain or pressure. No chest tightness. She reports she has had burning in her chest recently as well. She has been taking Nexium to help with her reflux. She does nearly reflux into her mouth. Previously tried Maalox without help. Reports she has a known hiatal hernia.   Allergies  Allergen Reactions  . Gabapentin     "slept for 2 days"  . Novocain [Procaine] Other (See Comments)    intolerance    Current Outpatient Prescriptions on File Prior to Visit  Medication Sig Dispense Refill  . albuterol (PROVENTIL HFA;VENTOLIN HFA) 108 (90 BASE) MCG/ACT inhaler Inhale 2 puffs into the lungs every 6 (six) hours as needed for wheezing.      . baclofen (LIORESAL) 10 MG tablet Take 1 tablet by mouth 4 (four) times daily as needed for muscle spasms.     . citalopram (CELEXA) 40 MG tablet Take 40 mg by mouth daily.  5  . diclofenac sodium (VOLTAREN) 1 % GEL Apply 1 application topically 4 (four) times daily. Applied to knees, shoulders, wrists, and ankles    . furosemide (LASIX) 40 MG tablet 2 tablets (80mg ) by mouth two times a day 120 tablet 1  . HYDROcodone-acetaminophen (NORCO/VICODIN) 5-325 MG per tablet Take 1-2 tablets by mouth every 4 (four) hours as needed. (Patient taking differently: Take 1-2 tablets by mouth every 4 (four) hours as needed for moderate pain. ) 15 tablet 0  . levothyroxine (SYNTHROID, LEVOTHROID) 150 MCG tablet Take 150 mcg by mouth daily.    . metoprolol tartrate (LOPRESSOR) 25 MG tablet Take 1 tablet by mouth 2 (two) times daily.    . nicotine polacrilex (NICORETTE) 4 MG gum Take 4 mg by mouth as needed for smoking cessation.    . potassium chloride SA (K-DUR,KLOR-CON) 20 MEQ tablet 2 tablets (40 mEq) by mouth in the AM and 1 tablet (20 mEq) by mouth in the PM 90 tablet 1  . pregabalin (LYRICA) 50 MG capsule Take 50 mg by mouth at bedtime.    Marland Kitchen Spacer/Aero-Holding Chambers (AEROCHAMBER Z-STAT PLUS CHAMBR) MISC Use as directed 1 each 0  . SPIRIVA HANDIHALER 18 MCG inhalation capsule PLACE 1 CAPSULE (18 MCG TOTAL) INTO INHALER AND INHALE DAILY. 30 capsule 6  . SYMBICORT 160-4.5 MCG/ACT inhaler INHALE TWO puffs into lung TWICE DAILY 10.2 g 5  .  tamsulosin (FLOMAX) 0.4 MG CAPS capsule Take 0.4 mg by mouth daily. Reported on 08/18/2015    . verapamil (CALAN) 120 MG tablet Take 120 mg by mouth 2 (two) times daily.     No current facility-administered medications on file prior to visit.    Past Medical History  Diagnosis Date  . Oral lesion   . Leg edema     chronic, bilateral  . Morbid obesity ([place])   . Chronic cough   . COPD (chronic obstructive pulmonary disease) ([place])   . Hypertension   . Low back pain    . Snoring   . GERD (gastroesophageal reflux disease)   . Heart murmur   . Shortness of breath   . Obesity hypoventilation syndrome [facility])     Past Surgical History  Procedure Laterality Date  . Meniscus repair    . Cholecystectomy    . Appendectomy    . Tubal ligation    . Ectopic pregnancy surgery    . Tonsillectomy    . Colonoscopy N/A 06/25/2013    Procedure: COLONOSCOPY;  Surgeon: [name], MD;  Location: [facility];  Service: Endoscopy;  Laterality: N/A;    Family History  Problem Relation Age of Onset  . Ovarian cancer Mother   . Lung disease Mother   . Heart failure Mother   . Lymphoma Brother   . Ovarian cancer Maternal Aunt   . Asthma Daughter     Social History   Social History  . Marital Status: Married    Spouse Name: N/A  . Number of Children: N/A  . Years of Education: N/A   Social History Main Topics  . Smoking status: Former Smoker -- 2.00 packs/day for 45 years    Types: Cigarettes    Quit date: 04/12/2015  . Smokeless tobacco: None     Comment: Previously had quit for 3 years before restarting  . Alcohol Use: No  . Drug Use: No  . Sexual Activity: Not Asked   Other Topics Concern  . None   Social History Narrative   Was a Data processing manager Pulmonary:   Originally from [place]. She has also lived in [place], [place], & [place]. Prior travel to [place]. 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 No bird exposure. Questionable mold exposure. No hot tub exposure. Currently uses a ventless gas heater.       Objective:   Physical Exam BP 126/74 mmHg  Pulse 73  Ht 5\' 2"  (1.575 m)  Wt 322 lb 9.6 oz (146.33 kg)  BMI 58.99 kg/m2  SpO2 96% General:  Awake. Alert. Morbid obesity.  Integument:  Warm & dry. No rash on exposed skin. Venous stasis changes bilateral lower extremities. HEENT:  Moist mucus membranes. No oral ulcers. Mild nasal turbinate swelling. Cardiovascular:  Regular rate. Pitting lower extremity  edema. Normal S1 & S2. Pulmonary:  Slightly diminished breath sounds bilateral lung bases. Otherwise clear to auscultation with good aeration. Normal work of breathing on nasal cannula oxygen. Abdomen: Soft. Normal bowel sounds. Protuberant. Musculoskeletal:  Normal bulk and tone. No joint deformity or effusion appreciated.  PFT 06/24/12: FVC 1.60 L (86%) FEV1 1.11 L (53%) FEV1/FVC 0.70 FEF 25-75 0.63 L (25%) no bronchodilator response TLC 2.96 L (66%) RV 81% ERV 30% DLC uncorrected 52%  IMAGING CXR PA/LAT 05/01/15 (previously reviewed by me): Questionable bilateral lower lung opacities with some perihilar prominence. No obvious opacity on lateral projection. Borderline  cardiomegaly. No pleural effusion appreciated. No nodule or mass appreciated.  LABS 05/10/15 Alpha-1 antitrypsin: MN (165)  05/01/15 ABG on RA:  7.41 / 48 / 64 / 92% CBC:7.2/13.5/43.3/187 BMP: 140/3.6/99/27/9/0.93/123/8.8 LFT:3.5/7.6/0.4/95/27/19 Lactic acid: 0.64 Troponin I: 0.00    Assessment & Plan:  65 year old female with underlying COPD as well as chronic hypercarbic and hypoxic respiratory failure secondary to obesity hypoventilation syndrome. Symptomatically the patient is well-controlled at this time. She is continuing on oxygen therapy as well as her current inhaler regimen for treatment of her underlying chronic hypoxic respiratory failure and COPD. We did discuss the need for treatment of her reflux as I believe this is contributing to some of her symptoms. I instructed the patient contact my office if she had any new breathing problems or questions before her next appointment.  1. COPD: Continuing on Symbicort & Spiriva as prescribed. Checking full pulmonary function testing at next appointment. 2. Chronic Hypoxic Respiratory Failure: Continuing on oxygen as previously prescribed. Repeat 6 minute walk test with oxygen titration at next appointment. 3. Chronic Hypercarbic Respiratory failure/OHS: Patient again  encouraged to lose weight with dietary restriction and exercise. She is going to reconsider pulmonary rehabilitation. 4. GERD w/ Hiatal Hernia:  Starting Zantac 150 mg by mouth daily at bedtime. 5. Health maintenance: Patient received Pneumovax in November 2014, Prevnar November 2015, & last influenza vaccine November 2015. 6. Follow-up: Patient to return to clinic in 3 months or sooner if needed.  Sonia Baller Ashok Cordia, M.D. [facility] Pulmonary & Critical Care Pager:  [phone] After 3pm or if no response, call [phone] 11:35 AM 09/05/2015

## 2015-09-05 NOTE — Patient Instructions (Addendum)
   Continue using your inhalers as prescribed.  Call me if you have any new breathing problems before your next appointment  We are starting Zantac (Ranitidine) 150mg  at night before bed to help with your reflux. Take it on a daily basis.  I will see you back in 3 months or sooner if needed.  TESTS ORDERED: 1. Full pulmonary function testing at follow-up appointment 2. 6 minute walk test with oxygen titration at next appointment

## 2015-09-26 ENCOUNTER — Inpatient Hospital Stay (HOSPITAL_COMMUNITY): Admission: RE | Admit: 2015-09-26 | Payer: PPO | Source: Ambulatory Visit

## 2015-10-10 ENCOUNTER — Other Ambulatory Visit: Payer: Self-pay | Admitting: Cardiology

## 2015-10-10 DIAGNOSIS — I5032 Chronic diastolic (congestive) heart failure: Secondary | ICD-10-CM

## 2015-10-10 MED ORDER — POTASSIUM CHLORIDE CRYS ER 20 MEQ PO TBCR: EXTENDED_RELEASE_TABLET | ORAL | 10 refills | Status: DC

## 2015-11-03 ENCOUNTER — Encounter (HOSPITAL_COMMUNITY): Payer: PPO

## 2015-11-09 ENCOUNTER — Other Ambulatory Visit: Payer: Self-pay | Admitting: Pulmonary Disease

## 2015-11-22 ENCOUNTER — Telehealth: Payer: Self-pay | Admitting: Pulmonary Disease

## 2015-11-22 MED ORDER — NYSTATIN 100000 UNIT/GM EX POWD: Freq: Three times a day (TID) | CUTANEOUS | 1 refills | Status: DC

## 2015-11-22 NOTE — Telephone Encounter (Signed)
Please have the patient check with her PCP to make sure she is not developing diabetes with her history of high blood glucose. In the meantime please send in a Rx for Nystatin Powder with instructions to apply to affected areas tid until rash is gone. Dispense 2 bottles & given 1 refill. Also instruct her she should be bathing with gentle soap such as Dove and not something bacteriocidal such as Dial. Thanks.

## 2015-11-22 NOTE — Telephone Encounter (Signed)
Spoke with pt and gave recommendations. She will monitor blood sugar, has home kit, and will notify PCP if any abnormal readings. She will try powder and contact office if not improving. Rx sent. Nothing further needed.

## 2015-11-22 NOTE — Telephone Encounter (Signed)
Spoke with pt, states that she has always had trouble with Spiriva causing a yeast infection, currently has bothered pt X2 weeks.  C/o yeast in underarms, under breasts, genitals.  Pt has tried monistat as well as washing with warm water and soap, which is not helping. Pt is requesting a rx of diflucan.  Pt uses [employer] [facility].  JN please advise on recs.  Thanks!

## 2015-11-27 ENCOUNTER — Other Ambulatory Visit: Payer: Self-pay | Admitting: Pulmonary Disease

## 2015-11-30 DIAGNOSIS — J3489 Other specified disorders of nose and nasal sinuses: Secondary | ICD-10-CM | POA: Insufficient documentation

## 2015-12-13 ENCOUNTER — Ambulatory Visit (INDEPENDENT_AMBULATORY_CARE_PROVIDER_SITE_OTHER): Payer: PPO | Admitting: Pulmonary Disease

## 2015-12-13 ENCOUNTER — Encounter: Payer: Self-pay | Admitting: Pulmonary Disease

## 2015-12-13 VITALS — BP 140/80 | HR 72 | Ht 62.0 in | Wt 322.0 lb

## 2015-12-13 DIAGNOSIS — J9611 Chronic respiratory failure with hypoxia: Secondary | ICD-10-CM | POA: Diagnosis not present

## 2015-12-13 DIAGNOSIS — Z23 Encounter for immunization: Secondary | ICD-10-CM

## 2015-12-13 DIAGNOSIS — R0600 Dyspnea, unspecified: Secondary | ICD-10-CM

## 2015-12-13 DIAGNOSIS — J449 Chronic obstructive pulmonary disease, unspecified: Secondary | ICD-10-CM | POA: Diagnosis not present

## 2015-12-13 DIAGNOSIS — K449 Diaphragmatic hernia without obstruction or gangrene: Secondary | ICD-10-CM

## 2015-12-13 DIAGNOSIS — K219 Gastro-esophageal reflux disease without esophagitis: Secondary | ICD-10-CM

## 2015-12-13 DIAGNOSIS — J9612 Chronic respiratory failure with hypercapnia: Secondary | ICD-10-CM

## 2015-12-13 DIAGNOSIS — J984 Other disorders of lung: Secondary | ICD-10-CM

## 2015-12-13 LAB — PULMONARY FUNCTION TEST
DL/VA % pred: 98 %
DL/VA: 4.49 ml/min/mmHg/L
DLCO cor % pred: 55 %
DLCO cor: 11.95 ml/min/mmHg
DLCO unc % pred: 57 %
DLCO unc: 12.4 ml/min/mmHg
FEF 25-75 Post: 0.86 L/sec
FEF 25-75 Pre: 1.24 L/sec
FEF2575-%Change-Post: -30 %
FEF2575-%Pred-Post: 41 %
FEF2575-%Pred-Pre: 59 %
FEV1-%Change-Post: -7 %
FEV1-%Pred-Post: 46 %
FEV1-%Pred-Pre: 50 %
FEV1-Post: 1.06 L
FEV1-Pre: 1.14 L
FEV1FVC-%Change-Post: 0 %
FEV1FVC-%Pred-Pre: 108 %
FEV6-%Change-Post: -6 %
FEV6-%Pred-Post: 44 %
FEV6-%Pred-Pre: 47 %
FEV6-Post: 1.28 L
FEV6-Pre: 1.37 L
FEV6FVC-%Pred-Post: 104 %
FEV6FVC-%Pred-Pre: 104 %
FVC-%Change-Post: -6 %
FVC-%Pred-Post: 43 %
FVC-%Pred-Pre: 46 %
FVC-Post: 1.28 L
FVC-Pre: 1.37 L
Post FEV1/FVC ratio: 83 %
Post FEV6/FVC ratio: 100 %
Pre FEV1/FVC ratio: 84 %
Pre FEV6/FVC Ratio: 100 %
RV % pred: 73 %
RV: 1.44 L
TLC % pred: 65 %
TLC: 3.1 L

## 2015-12-13 NOTE — Progress Notes (Signed)
Test reviewed.  

## 2015-12-13 NOTE — Progress Notes (Signed)
Subjective:    Patient ID: Morgan Odonnell, female    DOB: [birth year], 65 y.o.   MRN: [id number]  C.C.:  Follow-up for COPD, Chronic Hypoxic Respiratory Failure, Chronic Hypercarbic Respiratory Failure/OHV, & GERD.  HPI COPD: Previously referred to pulmonary rehab. Prescribed Symbicort & Spiriva. She reports her breathing is ok. Denies any coughing or wheezing. She reports rare use of her rescue inhaler once a week. No nocturnal awakenings with any breathing problems. No exacerbations since last appointment. She cannot afford pulmonary rehab.   Chronic Hypoxic Respiratory Failure: Prescribed oxygen at 2 L/m while sleeping. Patient demonstrated a 2 L/m requirement with walking today.  Chronic Hypercarbic Respiratory Failure/Obesity Hypoventilation Syndrome:  Polysomnogram in 2014 showed no OSA or periodic limb movements. Lowest saturation was 83% with 3:16:36 spent with saturation <88%. Patient referred to nutritionist at last appointment for weight loss. Not currently on NIPPV.  GERD w/ Hiatal Hernia:  Started on Zantac at last appointment. Reports reflux has markedly improved. No dyspepsia or morning brash water taste.   Review of Systems  She is continuing to have knee and back pain. No chest pain, pressure, or tightness. No abdominal pain. Did have nausea & emesis yesterday morning that she attributes to her nicotine gum.   Allergies  Allergen Reactions  . Gabapentin     "slept for 2 days"  . Novocain [Procaine] Other (See Comments)    intolerance    Current Outpatient Prescriptions on File Prior to Visit  Medication Sig Dispense Refill  . albuterol (PROVENTIL HFA;VENTOLIN HFA) 108 (90 BASE) MCG/ACT inhaler Inhale 2 puffs into the lungs every 6 (six) hours as needed for wheezing.     . baclofen (LIORESAL) 10 MG tablet Take 1 tablet by mouth 4 (four) times daily as needed for muscle spasms.     . citalopram (CELEXA) 40 MG tablet Take 40 mg by mouth daily.  5  . diclofenac sodium  (VOLTAREN) 1 % GEL Apply 1 application topically 4 (four) times daily. Applied to knees, shoulders, wrists, and ankles    . diphenhydrAMINE (SOMINEX) 25 MG tablet Take 50 mg by mouth daily.    . furosemide (LASIX) 40 MG tablet 2 tablets (80mg ) by mouth two times a day 120 tablet 1  . HYDROcodone-acetaminophen (NORCO/VICODIN) 5-325 MG per tablet Take 1-2 tablets by mouth every 4 (four) hours as needed. (Patient taking differently: Take 1-2 tablets by mouth every 4 (four) hours as needed for moderate pain. ) 15 tablet 0  . levothyroxine (SYNTHROID, LEVOTHROID) 150 MCG tablet Take 150 mcg by mouth daily.    . metoprolol tartrate (LOPRESSOR) 25 MG tablet Take 1 tablet by mouth 2 (two) times daily.    . nicotine polacrilex (NICORETTE) 4 MG gum Take 4 mg by mouth as needed for smoking cessation.    Marland Kitchen nystatin (MYCOSTATIN/NYSTOP) powder Apply topically 3 (three) times daily. 90 g 1  . potassium chloride SA (K-DUR,KLOR-CON) 20 MEQ tablet 2 tablets (40 mEq) by mouth in the AM and 1 tablet (20 mEq) by mouth in the PM 90 tablet 10  . pregabalin (LYRICA) 50 MG capsule Take 50 mg by mouth at bedtime.    . ranitidine (ZANTAC) 150 MG tablet Take 1 tablet (150 mg total) by mouth at bedtime. 30 tablet 6  . Spacer/Aero-Holding Chambers (AEROCHAMBER Z-STAT PLUS CHAMBR) MISC Use as directed 1 each 0  . SPIRIVA HANDIHALER 18 MCG inhalation capsule PLACE 1 CAPSULE (18 MCG TOTAL) INTO INHALER AND INHALE DAILY. 30 capsule 6  .  SYMBICORT 160-4.5 MCG/ACT inhaler INHALE TWO puffs into lung TWICE DAILY 10.2 g 3  . tamsulosin (FLOMAX) 0.4 MG CAPS capsule Take 0.4 mg by mouth daily. Reported on 08/18/2015    . verapamil (CALAN) 120 MG tablet Take 120 mg by mouth 2 (two) times daily.     No current facility-administered medications on file prior to visit.     Past Medical History:  Diagnosis Date  . Chronic cough   . COPD (chronic obstructive pulmonary disease) ([place])   . GERD (gastroesophageal reflux disease)   . Heart  murmur   . Hypertension   . Leg edema    chronic, bilateral  . Low back pain   . Morbid obesity ([place])   . Obesity hypoventilation syndrome ([place])   . Oral lesion   . Shortness of breath   . Snoring     Past Surgical History:  Procedure Laterality Date  . APPENDECTOMY    . CHOLECYSTECTOMY    . COLONOSCOPY N/A 06/25/2013   Procedure: COLONOSCOPY;  Surgeon: [name], MD;  Location: [facility];  Service: Endoscopy;  Laterality: N/A;  . ECTOPIC PREGNANCY SURGERY    . MENISCUS REPAIR    . TONSILLECTOMY    . TUBAL LIGATION      Family History  Problem Relation Age of Onset  . Ovarian cancer Mother   . Lung disease Mother   . Heart failure Mother   . Lymphoma Brother   . Ovarian cancer Maternal Aunt   . Asthma Daughter     Social History   Social History  . Marital status: Married    Spouse name: N/A  . Number of children: N/A  . Years of education: N/A   Social History Main Topics  . Smoking status: Former Smoker    Packs/day: 2.00    Years: 45.00    Types: Cigarettes    Quit date: 04/12/2015  . Smokeless tobacco: Never Used     Comment: still chews nicotine gum  . Alcohol use No  . Drug use: No  . Sexual activity: Not Asked   Other Topics Concern  . None   Social History Narrative   Was a Data processing manager Pulmonary:   Originally from [place]. She has also lived in [place], [place], & [place]. Prior travel to [place]. 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 No bird exposure. Questionable mold exposure. No hot tub exposure. Currently uses a ventless gas heater.       Objective:   Physical Exam BP 140/80 (BP Location: Left Arm, Cuff Size: Large)   Pulse 72   Ht 5\' 2"  (1.575 m)   Wt (!) 322 lb (146.1 kg)   SpO2 93%   BMI 58.89 kg/m  General:  Awake. Alert. Morbid obesityUnchanged.  Integument:  Warm & dry. No rash on exposed skin. Venous stasis changes bilateral lower extremities. Mildly dry and scaly skin on upper  extremities. HEENT:  Moist mucus membranes. No oral ulcers. No significant nasal turbinate swelling. Cardiovascular:  Regular rate. Pitting lower extremity edema mildly improved. Normal S1 & S2. Pulmonary:  Normal work of breathing on room air. Clear on auscultation with slightly diminished breath sounds in the bases. Speaking in complete sentences. Abdomen: Soft. Normal bowel sounds. Protuberant. Musculoskeletal:  Normal bulk and tone. No joint deformity or effusion appreciated.  PFT 12/13/15: FVC 1.34 L (46%) FEV1 1.14 L (50%) FEV1/FVC 0.84 FEF 25-75 1.24 L (89%) negative  bronchodilator response TLC 3.10 L (65%) RV 73% ERV 15% DLCO corrected 55% (Hgb 14.7) 06/24/12: FVC 1.60 L (56%) FEV1 1.11 L (53%) FEV1/FVC 0.70 FEF 25-75 0.63 L (25%) negative bronchodilator response TLC 2.96 L (66%) RV 81% ERV 30% DLCO uncorrected 52%  6MWT 12/13/15: Walked 3 Laps / Baseline Sat 98% on RA / Nadir Sat 88% on RA @ 1:00 (required 2 L/m to maintain saturation & unable to complete walk w/ 2:00 remaining)  IMAGING CXR PA/LAT 05/01/15 (previously reviewed by me): Questionable bilateral lower lung opacities with some perihilar prominence. No obvious opacity on lateral projection. Borderline cardiomegaly. No pleural effusion appreciated. No nodule or mass appreciated.  LABS 05/10/15 Alpha-1 antitrypsin: MM (165)  05/01/15 ABG on RA:  7.41 / 48 / 64 / 92% CBC:7.2/13.5/43.3/187 BMP: 140/3.6/99/27/9/0.93/123/8.8 LFT:3.5/7.6/0.4/95/27/19 Lactic acid: 0.64 Troponin I: 0.00    Assessment & Plan:  65 y.o. female with underlying COPD as well as chronic hypercarbic and hypoxic respiratory failure secondary to obesity hypoventilation syndrome. Patient demonstrated a 2 L/m oxygen requirement with ambulation today. Overall her FEV1 remains stable when compared with 2014 indicating continued stability in her underlying COPD. I did spend a significant amount time today discussing the need for dietary modification and caloric  restriction to encourage weight loss. I believe she would benefit from pulmonary rehabilitation to help her lose weight, but at this time this is cost prohibitive. Her restriction seen on lung volumes today is most certainly a function of her morbid obesity given her reduction in ERV. I instructed the patient contact my office if she had any new breathing problems or questions before her next appointment.  1. COPD: Continuing patient on Symbicort & Spiriva. Investigating as to the cost reason for the patient's co-pay with pulmonary rehabilitation.  2. Chronic Hypoxic Respiratory Failure: Continuing patient on oxygen at 2 L/m while sleeping along with exertion. Prescribed the patient a portable oxygen concentrator to use. 3. Chronic Hypercarbic Respiratory failure/OHS: Continuing on nocturnal oxygen therapy. No signs of acute decompensation. PCO2 not sufficient to warrant noninvasive positive pressure ventilation. 4. GERD w/ Hiatal Hernia:  Well-controlled. Continuing Zantac 150 mg by mouth daily at bedtime. 5. Health Maintenance: S/P Pneumovax in November 2014 & Prevnar November 2015. Administering Influenza Vaccine today. 6. Follow-up: Patient to return to clinic in 6 months or sooner if needed.  Sonia Baller Ashok Cordia, M.D. [facility] Pulmonary & Critical Care Pager:  [phone] After 3pm or if no response, call [phone] 11:34 AM 12/13/15

## 2015-12-13 NOTE — Patient Instructions (Signed)
   Continue using your inhalers and medications as prescribed.  Call me if you need any refills.  You only need to use oxygen at 2 L/m while you are walking or exerting yourself. You don't need to use your oxygen if you're not moving.  I will see you back in 6 months or sooner if needed.

## 2015-12-18 ENCOUNTER — Other Ambulatory Visit: Payer: Self-pay | Admitting: Pulmonary Disease

## 2015-12-22 ENCOUNTER — Other Ambulatory Visit: Payer: Self-pay

## 2015-12-22 DIAGNOSIS — J984 Other disorders of lung: Secondary | ICD-10-CM

## 2015-12-22 DIAGNOSIS — J418 Mixed simple and mucopurulent chronic bronchitis: Secondary | ICD-10-CM

## 2015-12-27 ENCOUNTER — Telehealth: Payer: Self-pay | Admitting: Pulmonary Disease

## 2015-12-27 NOTE — Telephone Encounter (Signed)
Message sent to [facility] from [facility].  Waiting for response back.  Will hold in my box.

## 2015-12-28 NOTE — Telephone Encounter (Signed)
Per Lenna Sciara, they are needing the order signed by JN.  Please advise when the order has been completed.  thanks

## 2016-03-05 ENCOUNTER — Other Ambulatory Visit: Payer: Self-pay | Admitting: Pulmonary Disease

## 2016-06-28 ENCOUNTER — Other Ambulatory Visit: Payer: Self-pay | Admitting: Pulmonary Disease

## 2016-09-27 ENCOUNTER — Encounter: Payer: Self-pay | Admitting: Pulmonary Disease

## 2016-10-03 ENCOUNTER — Ambulatory Visit (INDEPENDENT_AMBULATORY_CARE_PROVIDER_SITE_OTHER)
Admission: RE | Admit: 2016-10-03 | Discharge: 2016-10-03 | Disposition: A | Discharge status: Home / Self Care | Payer: Self-pay | Source: Ambulatory Visit | Attending: Pulmonary Disease | Admitting: Pulmonary Disease

## 2016-10-03 ENCOUNTER — Encounter: Payer: Self-pay | Admitting: Pulmonary Disease

## 2016-10-03 ENCOUNTER — Ambulatory Visit (INDEPENDENT_AMBULATORY_CARE_PROVIDER_SITE_OTHER): Payer: Self-pay | Admitting: Pulmonary Disease

## 2016-10-03 VITALS — BP 120/90 | HR 70 | Ht 62.0 in | Wt 340.8 lb

## 2016-10-03 DIAGNOSIS — M2550 Pain in unspecified joint: Secondary | ICD-10-CM

## 2016-10-03 DIAGNOSIS — J9611 Chronic respiratory failure with hypoxia: Secondary | ICD-10-CM

## 2016-10-03 DIAGNOSIS — K219 Gastro-esophageal reflux disease without esophagitis: Secondary | ICD-10-CM

## 2016-10-03 DIAGNOSIS — J9612 Chronic respiratory failure with hypercapnia: Secondary | ICD-10-CM

## 2016-10-03 DIAGNOSIS — Z9181 History of falling: Secondary | ICD-10-CM

## 2016-10-03 DIAGNOSIS — J449 Chronic obstructive pulmonary disease, unspecified: Secondary | ICD-10-CM

## 2016-10-03 DIAGNOSIS — E669 Obesity, unspecified: Secondary | ICD-10-CM

## 2016-10-03 NOTE — Patient Instructions (Addendum)
   Continue using your inhalers as prescribed.  I want you to use your Zantac every night. This should help your wheezing & your raspy voice.  Try to eliminate all of your salty snacks and ice cream.  Call me if you feel your breathing is getting worse.  TESTS ORDERED: 1. X-ray of right Shoulder & Bilateral knees

## 2016-10-03 NOTE — Progress Notes (Signed)
Subjective:    Patient ID: Morgan Odonnell, female    DOB: 02-23-1951, 66 y.o.   MRN: [id number]  C.C.:  Follow-up for COPD, Chronic Hypoxic Respiratory Failure, Chronic Hypercarbic Respiratory Failure/OHV, & GERD.  HPI She reports she fell out of bed and tried to stop herself. She has residual pain in her right shoulder as well as bilateral knees.   COPD: Patient previously referred to pulmonary rehabilitation but has significant co-pay. Prescribed Symbicort & Spiriva. She reports she has more dyspnea lately - over the last month. She denies any coughing but is wheezing more also. She has been using her rescue inhaler more frequently for her dyspnea with only mild improvement in her symptoms.   Chronic hypoxic respiratory failure: Prescribed oxygen at 2 L/m while sleeping and with exertion. She is compliant with her oxygen therapy.   Chronic hypercarbic respiratory failure/obesity hypoventilation syndrome: Patient currently not on noninvasive positive pressure ventilation. Previous polysomnogram in 2014 without sleep apnea or periodic leg movement disorder. Currently prescribed oxygen at 2 L/m while sleeping.  GERD with hiatal hernia: Previously prescribed Zantac. Denies any reflux or dyspepsia. No morning brash water taste. She is only taking her Zantac intermittently.   Review of Systems  She has noticed a raspy quality to her voice. Denies any chest tightness or pressure. No chills but subjective fever. She has had increased edema in her legs. She has been eating salty snacks lately.   Allergies  Allergen Reactions  . Gabapentin     "slept for 2 days"  . Novocain [Procaine] Other (See Comments)    intolerance    Current Outpatient Prescriptions on File Prior to Visit  Medication Sig Dispense Refill  . albuterol (PROVENTIL HFA;VENTOLIN HFA) 108 (90 BASE) MCG/ACT inhaler Inhale 2 puffs into the lungs every 6 (six) hours as needed for wheezing.     . baclofen (LIORESAL) 10 MG  tablet Take 1 tablet by mouth 4 (four) times daily as needed for muscle spasms.     . citalopram (CELEXA) 40 MG tablet Take 40 mg by mouth daily.  5  . diclofenac sodium (VOLTAREN) 1 % GEL Apply 1 application topically 4 (four) times daily. Applied to knees, shoulders, wrists, and ankles    . diphenhydrAMINE (SOMINEX) 25 MG tablet Take 50 mg by mouth daily.    . furosemide (LASIX) 40 MG tablet 2 tablets (80mg ) by mouth two times a day 120 tablet 1  . HYDROcodone-acetaminophen (NORCO/VICODIN) 5-325 MG per tablet Take 1-2 tablets by mouth every 4 (four) hours as needed. (Patient taking differently: Take 1-2 tablets by mouth every 4 (four) hours as needed for moderate pain. ) 15 tablet 0  . levothyroxine (SYNTHROID, LEVOTHROID) 150 MCG tablet Take 150 mcg by mouth daily.    . metoprolol tartrate (LOPRESSOR) 25 MG tablet Take 1 tablet by mouth 2 (two) times daily.    . nicotine polacrilex (NICORETTE) 4 MG gum Take 4 mg by mouth as needed for smoking cessation.    . potassium chloride SA (K-DUR,KLOR-CON) 20 MEQ tablet 2 tablets (40 mEq) by mouth in the AM and 1 tablet (20 mEq) by mouth in the PM 90 tablet 10  . pregabalin (LYRICA) 150 MG capsule Take 50 mg by mouth 2 (two) times daily.     . ranitidine (ZANTAC) 150 MG tablet Take 1 tablet (150 mg total) by mouth at bedtime. 30 tablet 3  . Spacer/Aero-Holding Chambers (AEROCHAMBER Z-STAT PLUS CHAMBR) MISC Use as directed 1 each 0  .  SPIRIVA HANDIHALER 18 MCG inhalation capsule PLACE 1 CAPSULE (18 MCG TOTAL) INTO INHALER AND INHALE DAILY. 30 capsule 6  . SYMBICORT 160-4.5 MCG/ACT inhaler INHALE TWO puffs into lung TWICE DAILY 10.2 g 5  . tamsulosin (FLOMAX) 0.4 MG CAPS capsule Take 0.4 mg by mouth daily. Reported on 08/18/2015    . verapamil (CALAN) 120 MG tablet Take 120 mg by mouth 2 (two) times daily.     No current facility-administered medications on file prior to visit.     Past Medical History:  Diagnosis Date  . Chronic cough   . COPD (chronic  obstructive pulmonary disease) ([place])   . GERD (gastroesophageal reflux disease)   . Heart murmur   . Hypertension   . Leg edema    chronic, bilateral  . Low back pain   . Morbid obesity ([place])   . Obesity hypoventilation syndrome ([place])   . Oral lesion   . Shortness of breath   . Snoring     Past Surgical History:  Procedure Laterality Date  . APPENDECTOMY    . CHOLECYSTECTOMY    . COLONOSCOPY N/A 06/25/2013   Procedure: COLONOSCOPY;  Surgeon: [name], MD;  Location: [facility];  Service: Endoscopy;  Laterality: N/A;  . ECTOPIC PREGNANCY SURGERY    . MENISCUS REPAIR    . TONSILLECTOMY    . TUBAL LIGATION      Family History  Problem Relation Age of Onset  . Ovarian cancer Mother   . Lung disease Mother   . Heart failure Mother   . Lymphoma Brother   . Ovarian cancer Maternal Aunt   . Asthma Daughter     Social History   Social History  . Marital status: Married    Spouse name: N/A  . Number of children: N/A  . Years of education: N/A   Social History Main Topics  . Smoking status: Former Smoker    Packs/day: 2.00    Years: 45.00    Types: Cigarettes    Quit date: 04/12/2015  . Smokeless tobacco: Never Used     Comment: still chews nicotine gum  . Alcohol use No  . Drug use: No  . Sexual activity: Not Asked   Other Topics Concern  . None   Social History Narrative   Was a Data processing manager Pulmonary:   Originally from [place]. She has also lived in [place], [place], & [place]. Prior travel to [place]. 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 No bird exposure. Questionable mold exposure. No hot tub exposure. Currently uses a ventless gas heater.       Objective:   Physical Exam BP 120/90 (BP Location: Left Arm, Patient Position: Sitting, Cuff Size: Large)   Pulse 70   Ht 5\' 2"  (1.575 m)   Wt (!) 340 lb 12.8 oz (154.6 kg)   SpO2 92% Comment: 2LPM pulsed  BMI 62.33 kg/m   General:  Awake. Alert. No acute distress.  Morbidly obese. Integument:  Warm & dry. No rash on exposed skin.  Extremities:  No cyanosis or clubbing.  HEENT:  Moist mucus membranes. No nasal turbinate swelling. No oral ulcers. Cardiovascular:  Regular rate. Pitting lower extremity edema. Unable to appreciate JVD given body habitus.  Pulmonary:  Clear to auscultation. Good aeration bilaterally. Normal work of breathing on room air.. Abdomen: Soft. Normal bowel sounds. Protuberant. Musculoskeletal:  Normal bulk and tone. No joint deformity or effusion appreciated. Pain  with palpation of upper right arm & palpation in the bicipital groove on the right. No pain with passive range of motion of right arm.   PFT 12/13/15: FVC 1.34 L (46%) FEV1 1.14 L (50%) FEV1/FVC 0.84 FEF 25-75 1.24 L (89%) negative bronchodilator response TLC 3.10 L (65%) RV 73% ERV 15% DLCO corrected 55% (Hgb 14.7) 06/24/12: FVC 1.60 L (56%) FEV1 1.11 L (53%) FEV1/FVC 0.70 FEF 25-75 0.63 L (25%) negative bronchodilator response TLC 2.96 L (66%) RV 81% ERV 30% DLCO uncorrected 52%  6MWT 12/13/15: Walked 3 Laps / Baseline Sat 98% on RA / Nadir Sat 88% on RA @ 1:00 (required 2 L/m to maintain saturation & unable to complete walk w/ 2:00 remaining)  POLYSOMNOGRAM (05/22/12):  Average awake saturation on room air 89%. Sleep latency 68.5 minutes. 0 periodic leg movements. Lowest saturation 83% on room air with 290.3 minutes with saturations between 60-90%. No significant sleep apnea with an overall AHI 3.3 events/hour & RDI 4.9/hr. hypercarbic and retaining CO2 above 50 torr. Rare snoring.  IMAGING CXR PA/LAT 05/01/15 (previously reviewed by me): Questionable bilateral lower lung opacities with some perihilar prominence. No obvious opacity on lateral projection. Borderline cardiomegaly. No pleural effusion appreciated. No nodule or mass appreciated.  LABS 05/10/15 Alpha-1 antitrypsin: MM (165)  05/01/15 ABG on RA:  7.41 / 48 / 64 / 92% CBC:7.2/13.5/43.3/187 BMP:  140/3.6/99/27/9/0.93/123/8.8 LFT:3.5/7.6/0.4/95/27/19 Lactic acid: 0.64 Troponin I: 0.00    Assessment & Plan:  66 y.o. female with COPD along with chronic hypercarbic and hypoxic respiratory failure. Patient also with known GERD and hiatal hernia.  Patient's voice changes and intermittent wheezing are likely secondary to silent laryngo-esophageal reflux. I doubt that her COPD is contributing significantly to her symptoms. She does have an 18 pound weight gain today compared with her weight in October which is likely due to her recent dietary indiscretions with regards to snacking, ice cream, etc. The patient is interested in gastric bypass surgery to help with her obesity. I did caution her that there would be further lifestyle and dietary modifications that she would have to make long-term to improve her chances for success. I instructed the patient to contact my office if she had any new breathing problems or questions before her next appointment.  1. COPD:  Continuing Symbicort and Spiriva. No changes. 2. Chronic hypoxic respiratory failure:  Continuing on oxygen as previously prescribed. 3. Chronic hypercarbic respiratory failure/OHS:  Continuing to hold on noninvasive positive pressure ventilation. 4. GERD with hiatal hernia:  Recommended using Zantac every night. Counseled on dietary modification. 5. Status post fall: Checking x-rays of bilateral knees and right shoulder/upper arm. Recommended patient follow-up with her primary care physician pain persisted. 6. Obesity: Referring patient for gastric bypass surgery evaluation. 7. Health maintenance: Status post Pneumovax in November 2014 & Prevnar November 2015. 8. Follow-up: Return to clinic in 6 months or sooner if needed.  Sonia Baller Ashok Cordia, M.D. [facility] Pager:  [phone] After 3pm or if no response, call [phone] 12:31 PM 10/03/16

## 2016-10-03 NOTE — Progress Notes (Signed)
Spoke with patient and informed her of results and recommendations. Pt verbalized understanding and did not have any questions. Nothing further is needed.

## 2016-10-03 NOTE — Addendum Note (Signed)
Addended by: Maryanna Shape A on: 10/03/2016 01:20 PM   Modules accepted: Orders

## 2016-10-03 NOTE — Addendum Note (Signed)
Addended by: Maryanna Shape A on: 10/03/2016 01:24 PM   Modules accepted: Orders

## 2016-10-09 ENCOUNTER — Other Ambulatory Visit: Payer: Self-pay | Admitting: Pulmonary Disease

## 2016-10-09 ENCOUNTER — Other Ambulatory Visit: Payer: Self-pay

## 2016-10-09 DIAGNOSIS — I5032 Chronic diastolic (congestive) heart failure: Secondary | ICD-10-CM

## 2016-10-09 MED ORDER — POTASSIUM CHLORIDE CRYS ER 20 MEQ PO TBCR: EXTENDED_RELEASE_TABLET | ORAL | 0 refills | Status: DC

## 2016-10-16 ENCOUNTER — Encounter (HOSPITAL_COMMUNITY): Payer: PPO | Admitting: Cardiology

## 2016-11-20 ENCOUNTER — Inpatient Hospital Stay (HOSPITAL_COMMUNITY): Admission: RE | Admit: 2016-11-20 | Payer: Medicare Other | Source: Ambulatory Visit | Admitting: Cardiology

## 2017-01-16 ENCOUNTER — Other Ambulatory Visit: Payer: Self-pay | Admitting: Emergency Medicine

## 2017-01-20 ENCOUNTER — Telehealth (HOSPITAL_COMMUNITY): Payer: Self-pay

## 2017-01-20 NOTE — Telephone Encounter (Signed)
CHF Clinic appointment reminder call placed to patient for upcoming post-hospital follow up.  Does understand purpose of this appointment and where CHF Clinic is located? Yes  How is patient feeling? Well, no cpmplaints  Does patient have all of their medications since their recent discharge? Yes  Patient also reminded to take all medications as prescribed on the day of his/her appointment and to bring all medications to this appointment.  Advised to call our office for tardiness or cancellations/rescheduling needs.  Leory Plowman, Guinevere Ferrari

## 2017-01-21 ENCOUNTER — Encounter (HOSPITAL_COMMUNITY): Payer: Medicare Other

## 2017-01-21 ENCOUNTER — Telehealth (HOSPITAL_COMMUNITY): Payer: Self-pay

## 2017-01-21 ENCOUNTER — Telehealth: Payer: Self-pay | Admitting: Licensed Clinical Social Worker

## 2017-01-21 NOTE — Telephone Encounter (Signed)
CSW received referral to follow up with patient regarding cancellations. Patient stated she has transportation issues and is the reason for cancelled appointments. CSW discussed [employer] application and accessibility for transportation to MD appointments. Patient verbalized interest and CSW will mail application for completion. CSW will follow up with returned application once patient completes. Morgan Odonnell, [place], [place]

## 2017-01-21 NOTE — Telephone Encounter (Signed)
Patient has cancelled/no showed for the 6th time in a row this past year for the CHF clinic. Will forward information to Clinic coordinator Kevan Rosebush RN and Social worker Raquel Sarna per policy to address whether patient should be discharged and care transferred to alternate office.  Renee Pain, RN

## 2017-01-22 NOTE — Telephone Encounter (Signed)
Kennyth Lose, CSW has addressed, see her phone note 11/13

## 2017-04-02 ENCOUNTER — Ambulatory Visit: Payer: Medicare Other | Admitting: Adult Health

## 2017-04-11 ENCOUNTER — Encounter: Payer: Self-pay | Admitting: Adult Health

## 2017-04-11 ENCOUNTER — Ambulatory Visit: Payer: Medicare Other | Admitting: Adult Health

## 2017-04-11 DIAGNOSIS — J9612 Chronic respiratory failure with hypercapnia: Secondary | ICD-10-CM | POA: Diagnosis not present

## 2017-04-11 DIAGNOSIS — J9611 Chronic respiratory failure with hypoxia: Secondary | ICD-10-CM

## 2017-04-11 DIAGNOSIS — J418 Mixed simple and mucopurulent chronic bronchitis: Secondary | ICD-10-CM | POA: Diagnosis not present

## 2017-04-11 DIAGNOSIS — I5032 Chronic diastolic (congestive) heart failure: Secondary | ICD-10-CM | POA: Diagnosis not present

## 2017-04-11 NOTE — Patient Instructions (Signed)
Continue on Symbicort and Spiriva Continue on oxygen 2 L Continue on low-salt diet Continue on Lasix Follow-up with Dr. Elsworth Soho in 6 months and as needed Please contact office for sooner follow up if symptoms do not improve or worsen or seek emergency care

## 2017-04-11 NOTE — Progress Notes (Signed)
@Patient  ID: Morgan Odonnell, female    DOB: 16-Mar-1950, 67 y.o.   MRN: [id number]  Chief Complaint  Patient presents with  . Follow-up    COPD    Referring provider: Leanna Battles, MD  HPI: 67 year old female former smoker followed for COPD, chronic hypoxic and hypercarbic respiratory failure  TEST  PFT 12/13/15: FVC 1.34 L (46%) FEV1 1.14 L (50%) FEV1/FVC 0.84 FEF 25-75 1.24 L (89%) negative bronchodilator response TLC 3.10 L (65%) RV 73% ERV 15% DLCO corrected 55% (Hgb 14.7) 06/24/12: FVC 1.60 L (56%) FEV1 1.11 L (53%) FEV1/FVC 0.70 FEF 25-75 0.63 L (25%) negative bronchodilator response TLC 2.96 L (66%) RV 81% ERV 30% DLCO uncorrected 52%  POLYSOMNOGRAM (05/22/12):  Average awake saturation on room air 89%. Sleep latency 68.5 minutes. 0 periodic leg movements. Lowest saturation 83% on room air with 290.3 minutes with saturations between 60-90%. No significant sleep apnea with an overall AHI 3.3 events/hour & RDI 4.9/hr. hypercarbic and retaining CO2 above 50 torr. Rare snoring.  LABS 05/10/15 Alpha-1 antitrypsin: MM (165)   04/11/2017 Follow up : COPD and O2  RF  Patient returns for a six-month follow-up.  Patient has known COPD.  She is on Symbicort and Spiriva.  She says overall her breathing is doing the same.  She does get winded with minimal activity.  She denies any increased cough or wheezing. PVX and Prevnar utd.   She says she had the flu 3 weeks ago. Tx w/ PCP [facility] and Tamiflu . Says she is recovered.   She is oxygen 2l/m 24/7 .   Patient has chronic hypercarbic and hypoxemic respiratory failure.  Previous sleep study showed no sign of sleep apnea.  She is on oxygen 2 L at bedtime.  Patient says she is got a lot of family stress.  Has adult children living her and a granddaughter that has type 1 diabetes    Allergies  Allergen Reactions  . Gabapentin     "slept for 2 days"  . Novocain [Procaine] Other (See Comments)    intolerance    Immunization  History  Administered Date(s) Administered  . Influenza Split 12/10/2011  . Influenza Whole 01/09/2017  . Influenza,inj,Quad PF,6+ Mos 12/04/2012, 01/17/2014, 12/13/2015  . Pneumococcal Conjugate-13 01/17/2014  . Pneumococcal Polysaccharide-23 01/26/2013    Past Medical History:  Diagnosis Date  . Chronic cough   . COPD (chronic obstructive pulmonary disease) ([place])   . GERD (gastroesophageal reflux disease)   . Heart murmur   . Hypertension   . Leg edema    chronic, bilateral  . Low back pain   . Morbid obesity ([place])   . Obesity hypoventilation syndrome ([place])   . Oral lesion   . Shortness of breath   . Snoring     Tobacco History: Social History   Tobacco Use  Smoking Status Former Smoker  . Packs/day: 2.00  . Years: 45.00  . Pack years: 90.00  . Types: Cigarettes  . Last attempt to quit: 04/12/2015  . Years since quitting: 2.0  Smokeless Tobacco Never Used  Tobacco Comment   still chews nicotine gum   Counseling given: Not Answered Comment: still chews nicotine gum   Outpatient Encounter Medications as of 04/11/2017  Medication Sig  . albuterol (PROVENTIL HFA;VENTOLIN HFA) 108 (90 BASE) MCG/ACT inhaler Inhale 2 puffs into the lungs every 6 (six) hours as needed for wheezing.   . baclofen (LIORESAL) 10 MG tablet Take 1 tablet by mouth 4 (four) times daily as  needed for muscle spasms.   . citalopram (CELEXA) 40 MG tablet Take 40 mg by mouth daily.  . diclofenac sodium (VOLTAREN) 1 % GEL Apply 1 application topically 4 (four) times daily. Applied to knees, shoulders, wrists, and ankles  . furosemide (LASIX) 40 MG tablet 2 tablets (80mg ) by mouth two times a day  . HYDROcodone-acetaminophen (NORCO/VICODIN) 5-325 MG per tablet Take 1-2 tablets by mouth every 4 (four) hours as needed. (Patient taking differently: Take 1-2 tablets by mouth every 4 (four) hours as needed for moderate pain. )  . levothyroxine (SYNTHROID, LEVOTHROID) 150 MCG tablet Take 150 mcg by mouth daily.   . metoprolol tartrate (LOPRESSOR) 25 MG tablet Take 1 tablet by mouth 2 (two) times daily.  . nicotine polacrilex (NICORETTE) 4 MG gum Take 4 mg by mouth as needed for smoking cessation.  . potassium chloride SA (K-DUR,KLOR-CON) 20 MEQ tablet 2 tablets (40 mEq) by mouth in the AM and 1 tablet (20 mEq) by mouth in the PM  . pregabalin (LYRICA) 150 MG capsule Take 50 mg by mouth 2 (two) times daily.   . ranitidine (ZANTAC) 150 MG tablet Take 1 tablet (150 mg total) by mouth at bedtime.  Marland Kitchen Spacer/Aero-Holding Chambers (AEROCHAMBER Z-STAT PLUS CHAMBR) MISC Use as directed  . SPIRIVA HANDIHALER 18 MCG inhalation capsule PLACE 1 CAPSULE (18 MCG TOTAL) INTO INHALER AND INHALE DAILY.  . SYMBICORT 160-4.5 MCG/ACT inhaler INHALE TWO puffs into lung TWICE DAILY  . tamsulosin (FLOMAX) 0.4 MG CAPS capsule Take 0.4 mg by mouth daily. Reported on 08/18/2015  . verapamil (CALAN) 120 MG tablet Take 120 mg by mouth 2 (two) times daily.  . [DISCONTINUED] diphenhydrAMINE (SOMINEX) 25 MG tablet Take 50 mg by mouth daily.   No facility-administered encounter medications on file as of 04/11/2017.      Review of Systems  Constitutional:   No  weight loss, night sweats,  Fevers, chills, + fatigue, or  lassitude.  HEENT:   No headaches,  Difficulty swallowing,  Tooth/dental problems, or  Sore throat,                No sneezing, itching, ear ache, nasal congestion, post nasal drip,   CV:  No chest pain,  Orthopnea, PND, swelling in lower extremities, anasarca, dizziness, palpitations, syncope.   GI  No heartburn, indigestion, abdominal pain, nausea, vomiting, diarrhea, change in bowel habits, loss of appetite, bloody stools.   Resp:  What is  No chest wall deformity  Skin: no rash or lesions.  GU: no dysuria, change in color of urine, no urgency or frequency.  No flank pain, no hematuria   MS:  No joint pain or swelling.  No decreased range of motion.  No back pain.    Physical Exam  BP 126/68 (BP  Location: Right Arm, Cuff Size: Normal)   Pulse 75   Ht 5\' 2"  (1.575 m)   Wt (!) 332 lb (150.6 kg)   SpO2 95%   BMI 60.72 kg/m    GEN: A/Ox3; pleasant , NAD, morbidly obese on oxygen in wheelchair   HEENT:  [place]/AT,  EACs-clear, TMs-wnl, NOSE-clear, THROAT-clear, no lesions, no postnasal drip or exudate noted.   NECK:  Supple w/ fair ROM; no JVD; normal carotid impulses w/o bruits; no thyromegaly or nodules palpated; no lymphadenopathy.    RESP   decreased breath sounds in the bases , no accessory muscle use, no dullness to percussion  CARD:  RRR, no m/r/g, 1+ peripheral edema, pulses intact, no  cyanosis or clubbing.  GI:   Soft & nt; nml bowel sounds; no organomegaly or masses detected.   Musco: Warm bil, no deformities or joint swelling noted.   Neuro: alert, no focal deficits noted.    Skin: Warm, no lesions or rashes    Lab Results:  CBC   BMET  BNP  Imaging: No results found.   Assessment & Plan:   COPD (chronic obstructive pulmonary disease)Gold C Compensated on present regimen.  Plan  Patient Instructions  Continue on Symbicort and Spiriva Continue on oxygen 2 L Continue on low-salt diet Continue on Lasix Follow-up with Dr. Elsworth Soho in 6 months and as needed Please contact office for sooner follow up if symptoms do not improve or worsen or seek emergency care       Chronic respiratory failure ([place]) Cont On o2   Chronic diastolic CHF (congestive heart failure) ([place]) Compensated  Low salt diet  Cont on lasix      Rexene Edison, NP 04/11/2017

## 2017-04-11 NOTE — Assessment & Plan Note (Signed)
Compensated on present regimen.  Plan  Patient Instructions  Continue on Symbicort and Spiriva Continue on oxygen 2 L Continue on low-salt diet Continue on Lasix Follow-up with Dr. Elsworth Soho in 6 months and as needed Please contact office for sooner follow up if symptoms do not improve or worsen or seek emergency care

## 2017-04-11 NOTE — Assessment & Plan Note (Signed)
Compensated  Low salt diet  Cont on lasix

## 2017-04-11 NOTE — Assessment & Plan Note (Signed)
Cont On o2

## 2017-04-25 NOTE — Progress Notes (Signed)
Reviewed & agree with plan  

## 2017-06-26 ENCOUNTER — Emergency Department (HOSPITAL_COMMUNITY): Payer: Medicare Other

## 2017-06-26 ENCOUNTER — Encounter (HOSPITAL_COMMUNITY): Payer: Self-pay | Admitting: Emergency Medicine

## 2017-06-26 ENCOUNTER — Inpatient Hospital Stay (HOSPITAL_COMMUNITY)
Admission: EM | Admit: 2017-06-26 | Discharge: 2017-07-04 | DRG: 291 | Disposition: A | Discharge status: Home / Self Care | Payer: Medicare Other | Attending: Internal Medicine | Admitting: Internal Medicine

## 2017-06-26 DIAGNOSIS — E872 Acidosis: Secondary | ICD-10-CM | POA: Diagnosis present

## 2017-06-26 DIAGNOSIS — Z79891 Long term (current) use of opiate analgesic: Secondary | ICD-10-CM

## 2017-06-26 DIAGNOSIS — J449 Chronic obstructive pulmonary disease, unspecified: Secondary | ICD-10-CM | POA: Diagnosis present

## 2017-06-26 DIAGNOSIS — R29702 NIHSS score 2: Secondary | ICD-10-CM | POA: Diagnosis present

## 2017-06-26 DIAGNOSIS — I130 Hypertensive heart and chronic kidney disease with heart failure and stage 1 through stage 4 chronic kidney disease, or unspecified chronic kidney disease: Secondary | ICD-10-CM | POA: Diagnosis not present

## 2017-06-26 DIAGNOSIS — I5032 Chronic diastolic (congestive) heart failure: Secondary | ICD-10-CM | POA: Diagnosis present

## 2017-06-26 DIAGNOSIS — E039 Hypothyroidism, unspecified: Secondary | ICD-10-CM | POA: Diagnosis present

## 2017-06-26 DIAGNOSIS — F329 Major depressive disorder, single episode, unspecified: Secondary | ICD-10-CM | POA: Diagnosis present

## 2017-06-26 DIAGNOSIS — Z9981 Dependence on supplemental oxygen: Secondary | ICD-10-CM

## 2017-06-26 DIAGNOSIS — I10 Essential (primary) hypertension: Secondary | ICD-10-CM | POA: Diagnosis present

## 2017-06-26 DIAGNOSIS — J9622 Acute and chronic respiratory failure with hypercapnia: Secondary | ICD-10-CM | POA: Diagnosis present

## 2017-06-26 DIAGNOSIS — J441 Chronic obstructive pulmonary disease with (acute) exacerbation: Secondary | ICD-10-CM | POA: Diagnosis present

## 2017-06-26 DIAGNOSIS — Z720 Tobacco use: Secondary | ICD-10-CM

## 2017-06-26 DIAGNOSIS — K219 Gastro-esophageal reflux disease without esophagitis: Secondary | ICD-10-CM | POA: Diagnosis present

## 2017-06-26 DIAGNOSIS — Z884 Allergy status to anesthetic agent status: Secondary | ICD-10-CM

## 2017-06-26 DIAGNOSIS — G9341 Metabolic encephalopathy: Secondary | ICD-10-CM | POA: Diagnosis present

## 2017-06-26 DIAGNOSIS — I878 Other specified disorders of veins: Secondary | ICD-10-CM | POA: Diagnosis present

## 2017-06-26 DIAGNOSIS — Z7951 Long term (current) use of inhaled steroids: Secondary | ICD-10-CM

## 2017-06-26 DIAGNOSIS — J9621 Acute and chronic respiratory failure with hypoxia: Secondary | ICD-10-CM | POA: Diagnosis present

## 2017-06-26 DIAGNOSIS — Z6841 Body Mass Index (BMI) 40.0 and over, adult: Secondary | ICD-10-CM

## 2017-06-26 DIAGNOSIS — E876 Hypokalemia: Secondary | ICD-10-CM | POA: Diagnosis not present

## 2017-06-26 DIAGNOSIS — E875 Hyperkalemia: Secondary | ICD-10-CM | POA: Diagnosis present

## 2017-06-26 DIAGNOSIS — Z9114 Patient's other noncompliance with medication regimen: Secondary | ICD-10-CM

## 2017-06-26 DIAGNOSIS — N179 Acute kidney failure, unspecified: Secondary | ICD-10-CM | POA: Diagnosis present

## 2017-06-26 DIAGNOSIS — J9602 Acute respiratory failure with hypercapnia: Secondary | ICD-10-CM | POA: Diagnosis present

## 2017-06-26 DIAGNOSIS — G894 Chronic pain syndrome: Secondary | ICD-10-CM | POA: Diagnosis present

## 2017-06-26 DIAGNOSIS — Z79899 Other long term (current) drug therapy: Secondary | ICD-10-CM

## 2017-06-26 DIAGNOSIS — N182 Chronic kidney disease, stage 2 (mild): Secondary | ICD-10-CM | POA: Diagnosis present

## 2017-06-26 DIAGNOSIS — M545 Low back pain: Secondary | ICD-10-CM | POA: Diagnosis present

## 2017-06-26 DIAGNOSIS — D72829 Elevated white blood cell count, unspecified: Secondary | ICD-10-CM | POA: Diagnosis not present

## 2017-06-26 DIAGNOSIS — Z888 Allergy status to other drugs, medicaments and biological substances status: Secondary | ICD-10-CM

## 2017-06-26 DIAGNOSIS — R825 Elevated urine levels of drugs, medicaments and biological substances: Secondary | ICD-10-CM | POA: Diagnosis present

## 2017-06-26 DIAGNOSIS — I890 Lymphedema, not elsewhere classified: Secondary | ICD-10-CM | POA: Diagnosis present

## 2017-06-26 DIAGNOSIS — R402413 Glasgow coma scale score 13-15, at hospital admission: Secondary | ICD-10-CM | POA: Diagnosis present

## 2017-06-26 DIAGNOSIS — M25569 Pain in unspecified knee: Secondary | ICD-10-CM | POA: Diagnosis present

## 2017-06-26 DIAGNOSIS — Z87891 Personal history of nicotine dependence: Secondary | ICD-10-CM

## 2017-06-26 DIAGNOSIS — E662 Morbid (severe) obesity with alveolar hypoventilation: Secondary | ICD-10-CM | POA: Diagnosis present

## 2017-06-26 DIAGNOSIS — T50905A Adverse effect of unspecified drugs, medicaments and biological substances, initial encounter: Secondary | ICD-10-CM | POA: Diagnosis present

## 2017-06-26 DIAGNOSIS — Z9119 Patient's noncompliance with other medical treatment and regimen: Secondary | ICD-10-CM

## 2017-06-26 DIAGNOSIS — I5033 Acute on chronic diastolic (congestive) heart failure: Secondary | ICD-10-CM | POA: Diagnosis present

## 2017-06-26 LAB — I-STAT ARTERIAL BLOOD GAS, ED
Acid-Base Excess: 2 mmol/L (ref 0.0–2.0)
Bicarbonate: 29.6 mmol/L — ABNORMAL HIGH (ref 20.0–28.0)
O2 Saturation: 97 %
Patient temperature: 98.6
TCO2: 31 mmol/L (ref 22–32)
pCO2 arterial: 60 mmHg — ABNORMAL HIGH (ref 32.0–48.0)
pH, Arterial: 7.301 — ABNORMAL LOW (ref 7.350–7.450)
pO2, Arterial: 99 mmHg (ref 83.0–108.0)

## 2017-06-26 LAB — CBC WITH DIFFERENTIAL/PLATELET
Basophils Absolute: 0 10*3/uL (ref 0.0–0.1)
Basophils Relative: 0 %
Eosinophils Absolute: 0.4 10*3/uL (ref 0.0–0.7)
Eosinophils Relative: 4 %
HCT: 42.9 % (ref 36.0–46.0)
Hemoglobin: 12.7 g/dL (ref 12.0–15.0)
Lymphocytes Relative: 21 %
Lymphs Abs: 2.3 10*3/uL (ref 0.7–4.0)
MCH: 27 pg (ref 26.0–34.0)
MCHC: 29.6 g/dL — ABNORMAL LOW (ref 30.0–36.0)
MCV: 91.3 fL (ref 78.0–100.0)
Monocytes Absolute: 1 10*3/uL (ref 0.1–1.0)
Monocytes Relative: 9 %
Neutro Abs: 7.1 10*3/uL (ref 1.7–7.7)
Neutrophils Relative %: 66 %
Platelets: 217 10*3/uL (ref 150–400)
RBC: 4.7 MIL/uL (ref 3.87–5.11)
RDW: 15.3 % (ref 11.5–15.5)
WBC: 10.7 10*3/uL — ABNORMAL HIGH (ref 4.0–10.5)

## 2017-06-26 LAB — ETHANOL: Alcohol, Ethyl (B): <10 mg/dL (ref <10)

## 2017-06-26 LAB — COMPREHENSIVE METABOLIC PANEL
ALT: 18 U/L (ref 14–54)
AST: 26 U/L (ref 15–41)
Albumin: 3.5 g/dL (ref 3.5–5.0)
Alkaline Phosphatase: 122 U/L (ref 38–126)
Anion gap: 12 (ref 5–15)
BUN: 24 mg/dL — ABNORMAL HIGH (ref 6–20)
CO2: 26 mmol/L (ref 22–32)
Calcium: 9.2 mg/dL (ref 8.9–10.3)
Chloride: 98 mmol/L — ABNORMAL LOW (ref 101–111)
Creatinine, Ser: 2.09 mg/dL — ABNORMAL HIGH (ref 0.44–1.00)
GFR calc Af Amer: 27 mL/min — ABNORMAL LOW (ref >60)
GFR calc non Af Amer: 24 mL/min — ABNORMAL LOW (ref >60)
Glucose, Bld: 113 mg/dL — ABNORMAL HIGH (ref 65–99)
Potassium: 5.4 mmol/L — ABNORMAL HIGH (ref 3.5–5.1)
Sodium: 136 mmol/L (ref 135–145)
Total Bilirubin: 0.8 mg/dL (ref 0.3–1.2)
Total Protein: 8.5 g/dL — ABNORMAL HIGH (ref 6.5–8.1)

## 2017-06-26 LAB — CK: Total CK: 54 U/L (ref 38–234)

## 2017-06-26 LAB — AMMONIA: Ammonia: 61 umol/L — ABNORMAL HIGH (ref 9–35)

## 2017-06-26 LAB — BRAIN NATRIURETIC PEPTIDE: B Natriuretic Peptide: 190.9 pg/mL — ABNORMAL HIGH (ref 0.0–100.0)

## 2017-06-26 MED ORDER — THIAMINE HCL 100 MG/ML IJ SOLN
100.0000 mg | Freq: Once | INTRAMUSCULAR | Status: AC
Start: 2017-06-26 — End: 2017-06-26
  Administered 2017-06-26: 100 mg via INTRAVENOUS
  Filled 2017-06-26: qty 2

## 2017-06-26 MED ORDER — NALOXONE HCL 0.4 MG/ML IJ SOLN
0.4000 mg | Freq: Once | INTRAMUSCULAR | Status: AC
  Administered 2017-06-26: 0.4 mg via INTRAVENOUS
  Filled 2017-06-26: qty 1

## 2017-06-26 NOTE — ED Provider Notes (Signed)
[facility] EMERGENCY DEPARTMENT Provider Note   CSN: [id number] Arrival date & time: 06/26/17  2034  5 caveat altered mental status is obtained from patient's husband who accompanies her   History   Chief Complaint Chief Complaint  Patient presents with  . Fatigue    HPI Morgan Odonnell is a 67 y.o. female.  Patient has been difficult to arouse since this morning.  She slept last night in a chair.  She is brought by EMS.  EMS noted patient's pulse oximetry to be 88% on 2 L of oxygen.  Pretreated patient with oxygen nonrebreather mask.  Her husband says she was last normal yesterday.  HPI  Past Medical History:  Diagnosis Date  . Chronic cough   . COPD (chronic obstructive pulmonary disease) ([place])   . GERD (gastroesophageal reflux disease)   . Heart murmur   . Hypertension   . Leg edema    chronic, bilateral  . Low back pain   . Morbid obesity ([place])   . Obesity hypoventilation syndrome ([place])   . Oral lesion   . Shortness of breath   . Snoring     Patient Active Problem List   Diagnosis Date Noted  . Restrictive lung disease 12/13/2015  . Obesity hypoventilation syndrome ([place]) 05/10/2015  . Chronic respiratory failure ([place]) 05/10/2015  . Acute on chronic diastolic CHF (congestive heart failure) ([place]) 03/03/2015  . COPD with exacerbation ([place]) 03/02/2015  . Tobacco abuse 03/02/2015  . Morbid obesity with BMI of 50.0-59.9, adult ([place]) 10/18/2013  . Lymphedema 01/26/2013  . Chronic diastolic CHF (congestive heart failure) ([place]) 01/04/2013  . CHF (congestive heart failure) ([place]) 12/08/2012  . Chronic cough   . COPD (chronic obstructive pulmonary disease)[place] C   . Snoring   . Low back pain   . Hypertension     Past Surgical History:  Procedure Laterality Date  . APPENDECTOMY    . CHOLECYSTECTOMY    . COLONOSCOPY N/A 06/25/2013   Procedure: COLONOSCOPY;  Surgeon: [name], MD;  Location: [facility];  Service: Endoscopy;  Laterality:  N/A;  . ECTOPIC PREGNANCY SURGERY    . MENISCUS REPAIR    . TONSILLECTOMY    . TUBAL LIGATION       OB History   None      Home Medications    Prior to Admission medications   Medication Sig Start Date End Date Taking? Authorizing Provider  albuterol (PROVENTIL HFA;VENTOLIN HFA) 108 (90 BASE) MCG/ACT inhaler Inhale 2 puffs into the lungs every 6 (six) hours as needed for wheezing.     [provider]  baclofen (LIORESAL) 10 MG tablet Take 1 tablet by mouth 4 (four) times daily as needed for muscle spasms.     [provider]  citalopram (CELEXA) 40 MG tablet Take 40 mg by mouth daily. 02/11/15   [provider]  diclofenac sodium (VOLTAREN) 1 % GEL Apply 1 application topically 4 (four) times daily. Applied to knees, shoulders, wrists, and ankles    [provider]  furosemide (LASIX) 40 MG tablet 2 tablets (80mg ) by mouth two times a day 08/18/15   Larey Dresser, MD  HYDROcodone-acetaminophen (NORCO/VICODIN) 5-325 MG per tablet Take 1-2 tablets by mouth every 4 (four) hours as needed. Patient taking differently: Take 1-2 tablets by mouth every 4 (four) hours as needed for moderate pain.  05/26/14   Waynetta Pean, PA-C  levothyroxine (SYNTHROID, LEVOTHROID) 150 MCG tablet Take 150 mcg by mouth daily.  [provider]  metoprolol tartrate (LOPRESSOR) 25 MG tablet Take 1 tablet by mouth 2 (two) times daily. 11/23/12   [provider]  nicotine polacrilex (NICORETTE) 4 MG gum Take 4 mg by mouth as needed for smoking cessation.    [provider]  potassium chloride SA (K-DUR,KLOR-CON) 20 MEQ tablet 2 tablets (40 mEq) by mouth in the AM and 1 tablet (20 mEq) by mouth in the PM 10/09/16   Larey Dresser, MD  pregabalin (LYRICA) 150 MG capsule Take 50 mg by mouth 2 (two) times daily.     [provider]  ranitidine (ZANTAC) 150 MG tablet Take 1 tablet (150 mg total) by mouth at bedtime. 01/16/17   Javier Glazier, MD    Spacer/Aero-Holding Chambers (AEROCHAMBER Z-STAT PLUS [place]) MISC Use as directed 05/10/15   Javier Glazier, MD  SPIRIVA HANDIHALER 18 MCG inhalation capsule PLACE 1 CAPSULE (18 MCG TOTAL) INTO INHALER AND INHALE DAILY. 06/27/14   Parrett, Fonnie Mu, NP  SYMBICORT 160-4.5 MCG/ACT inhaler INHALE TWO puffs into lung TWICE DAILY 10/09/16   Javier Glazier, MD  tamsulosin (FLOMAX) 0.4 MG CAPS capsule Take 0.4 mg by mouth daily. Reported on 08/18/2015    [provider]  verapamil (CALAN) 120 MG tablet Take 120 mg by mouth 2 (two) times daily.    [provider]    Family History Family History  Problem Relation Age of Onset  . Ovarian cancer Mother   . Lung disease Mother   . Heart failure Mother   . Lymphoma Brother   . Ovarian cancer Maternal Aunt   . Asthma Daughter     Social History Social History   Tobacco Use  . Smoking status: Former Smoker    Packs/day: 2.00    Years: 45.00    Pack years: 90.00    Types: Cigarettes    Last attempt to quit: 04/12/2015    Years since quitting: 2.2  . Smokeless tobacco: Never Used  . Tobacco comment: still chews nicotine gum  Substance Use Topics  . Alcohol use: No    Alcohol/week: 0.0 oz  . Drug use: No     Allergies   Gabapentin and Novocain [procaine]   Review of Systems Review of Systems  Unable to perform ROS: Mental status change  Cardiovascular: Positive for leg swelling.       Chronic lymphedema     Physical Exam Updated Vital Signs BP 115/85   Pulse 66   Temp (!) 97.1 F (36.2 C) (Rectal)   Resp 16   SpO2 98%   Physical Exam  Constitutional:  Chronically ill-appearing  HENT:  Head: Normocephalic and atraumatic.  No facial asymmetry  Eyes: Pupils are equal, round, and reactive to light. Conjunctivae are normal.  Neck: Neck supple. No tracheal deviation present. No thyromegaly present.  Cardiovascular: Normal rate and regular rhythm.  No murmur heard. Pulmonary/Chest:  Shallow breath  sounds.  Abdominal: Soft. Bowel sounds are normal. She exhibits no distension. There is no tenderness.  morbidly obese  Musculoskeletal: Normal range of motion. She exhibits edema. She exhibits no tenderness.  3+ pretibial nonpitting edema bilaterally  Neurological: Coordination normal.  Sleepy arousable to tactile stimulus.  Follows simple commands and moves all extremities  Skin: Skin is warm and dry. No rash noted.  Nursing note and vitals reviewed.    ED Treatments / Results  Labs (all labs ordered are listed, but only abnormal results are displayed) Labs Reviewed - No data to  display  EKG None  EKG Interpretation  Date/Time:  Thursday June 26 2017 21:00:21 EDT Ventricular Rate:  66 PR Interval:    QRS Duration: 94 QT Interval:  424 QTC Calculation: 445 R Axis:   -11 Text Interpretation:  Pacemaker spikes or artifacts Sinus rhythm Low voltage, extremity and precordial leads Minimal ST elevation, inferior leads Baseline wander in lead(s) V3 No significant change since last tracing Confirmed by Orlie Dakin [phone]) on 06/26/2017 9:41:42 PM     chest xray Results for orders placed or performed during the hospital encounter of 06/26/17  Comprehensive metabolic panel  Result Value Ref Range   Sodium 136 135 - 145 mmol/L   Potassium 5.4 (H) 3.5 - 5.1 mmol/L   Chloride 98 (L) 101 - 111 mmol/L   CO2 26 22 - 32 mmol/L   Glucose, Bld 113 (H) 65 - 99 mg/dL   BUN 24 (H) 6 - 20 mg/dL   Creatinine, Ser 2.09 (H) 0.44 - 1.00 mg/dL   Calcium 9.2 8.9 - 10.3 mg/dL   Total Protein 8.5 (H) 6.5 - 8.1 g/dL   Albumin 3.5 3.5 - 5.0 g/dL   AST 26 15 - 41 U/L   ALT 18 14 - 54 U/L   Alkaline Phosphatase 122 38 - 126 U/L   Total Bilirubin 0.8 0.3 - 1.2 mg/dL   GFR calc non Af Amer 24 (L) >60 mL/min   GFR calc Af Amer 27 (L) >60 mL/min   Anion gap 12 5 - 15  CBC with Differential/Platelet  Result Value Ref Range   WBC 10.7 (H) 4.0 - 10.5 K/uL   RBC 4.70 3.87 - 5.11 MIL/uL    Hemoglobin 12.7 12.0 - 15.0 g/dL   HCT 42.9 36.0 - 46.0 %   MCV 91.3 78.0 - 100.0 fL   MCH 27.0 26.0 - 34.0 pg   MCHC 29.6 (L) 30.0 - 36.0 g/dL   RDW 15.3 11.5 - 15.5 %   Platelets 217 150 - 400 K/uL   Neutrophils Relative % 66 %   Neutro Abs 7.1 1.7 - 7.7 K/uL   Lymphocytes Relative 21 %   Lymphs Abs 2.3 0.7 - 4.0 K/uL   Monocytes Relative 9 %   Monocytes Absolute 1.0 0.1 - 1.0 K/uL   Eosinophils Relative 4 %   Eosinophils Absolute 0.4 0.0 - 0.7 K/uL   Basophils Relative 0 %   Basophils Absolute 0.0 0.0 - 0.1 K/uL  Ethanol  Result Value Ref Range   Alcohol, Ethyl (B) <10 <10 mg/dL  Ammonia  Result Value Ref Range   Ammonia 61 (H) 9 - 35 umol/L  Brain natriuretic peptide  Result Value Ref Range   B Natriuretic Peptide 190.9 (H) 0.0 - 100.0 pg/mL  CK  Result Value Ref Range   Total CK 54 38 - 234 U/L  I-Stat arterial blood gas, ED  Result Value Ref Range   pH, Arterial 7.301 (L) 7.350 - 7.450   pCO2 arterial 60.0 (H) 32.0 - 48.0 mmHg   pO2, Arterial 99.0 83.0 - 108.0 mmHg   Bicarbonate 29.6 (H) 20.0 - 28.0 mmol/L   TCO2 31 22 - 32 mmol/L   O2 Saturation 97.0 %   Acid-Base Excess 2.0 0.0 - 2.0 mmol/L   Patient temperature 98.6 F    Collection site RADIAL, ALLEN'S TEST ACCEPTABLE    Drawn by RT    Sample type ARTERIAL    Ct Head Wo Contrast  Result Date: 06/27/2017 CLINICAL DATA:  Acute onset  of altered mental status. Lethargy. EXAM: CT HEAD WITHOUT CONTRAST TECHNIQUE: Contiguous axial images were obtained from the base of the skull through the vertex without intravenous contrast. COMPARISON:  CT of the head performed 05/17/2010 FINDINGS: Brain: No evidence of acute infarction, hemorrhage, hydrocephalus, extra-axial collection or mass lesion/mass effect. Mild periventricular white matter change likely reflects small vessel ischemic microangiopathy. The posterior fossa, including the cerebellum, brainstem and fourth ventricle, is within normal limits. The third and lateral  ventricles, and basal ganglia are unremarkable in appearance. The cerebral hemispheres are symmetric in appearance, with normal gray-white differentiation. No mass effect or midline shift is seen. Vascular: No hyperdense vessel or unexpected calcification. Skull: There is no evidence of fracture; visualized osseous structures are unremarkable in appearance. Sinuses/Orbits: The orbits are within normal limits. The paranasal sinuses and mastoid air cells are well-aerated. Other: No significant soft tissue abnormalities are seen. IMPRESSION: 1. No acute intracranial pathology seen on CT. 2. Mild small vessel ischemic microangiopathy. Electronically Signed   By: Garald Balding M.D.   On: 06/27/2017 00:06   Dg Chest Port 1 View  Result Date: 06/26/2017 CLINICAL DATA:  Lethargy.  History of COPD. EXAM: PORTABLE CHEST 1 VIEW COMPARISON:  Chest radiograph May 01, 2015 FINDINGS: Cardiac silhouette is similarly enlarged. Mediastinal silhouette is nonsuspicious. Diffuse interstitial prominence. LEFT lower lung zone similar scarring. Small LEFT pleural effusion. No pneumothorax. Soft tissue planes and included osseous structures are nonsuspicious. IMPRESSION: Cardiomegaly. Interstitial prominence seen with pulmonary edema, probable background of COPD. Small LEFT pleural effusion. Electronically Signed   By: Elon Alas M.D.   On: 06/26/2017 22:13   Radiology No results found.  Procedures Procedures (including critical care time)  Medications Ordered in ED Medications  naloxone (NARCAN) injection 0.4 mg (has no administration in time range)  thiamine (B-1) injection 100 mg (has no administration in time range)     Initial Impression / Assessment and Plan / ED Course  I have reviewed the triage vital signs and the nursing notes.  Pertinent labs & imaging results that were available during my care of the patient were reviewed by me and considered in my medical decision making (see chart for  details).     9:30 PM patient had no response to intravenous Narcan or intravenous thiamine.  BiPAP ordered Blood gas consistent with respiratory acidosis, acute on chronic  11 PM patient's exam is essentially unchanged.  She is arousable to tactile stimulus.  She remains somnolent. 12:30 AM patient's exam unchanged.  She is on BiPAP.  She is arousable to tactile stimulus and speaks a few words Her mental status changes multifactorial.  Blood ammonia elevated.  Lab work also consistent with also has acute kidney injury.  And mild hyperkalemia  Chest x-ray reviewed by me Consulted Dr.Niu will arrange for admission Final Clinical Impressions(s) / ED Diagnoses   Final diagnoses:  None    #1 acute on chronic hypercarbic respiratory failure #2 acute encephalopathy #3 hyperkalemia #4 acute kidney injury  CRITICAL CARE Performed by: Orlie Dakin Total critical care time: 60 minutes Critical care time was exclusive of separately billable procedures and treating other patients. Critical care was necessary to treat or prevent imminent or life-threatening deterioration. Critical care was time spent personally by me on the following activities: development of treatment plan with patient and/or surrogate as well as nursing, discussions with consultants, evaluation of patient's response to treatment, examination of patient, obtaining history from patient or surrogate, ordering and performing treatments and interventions, ordering and  review of laboratory studies, ordering and review of radiographic studies, pulse oximetry and re-evaluation of patient's condition. ED Discharge Orders    None       Orlie Dakin, MD 06/27/17 [phone]

## 2017-06-26 NOTE — ED Triage Notes (Signed)
Pt BIB [facility] for increased lethargy x 1 day. Pt's SpO2 on home 2L [place] 88%. Pt arousable, A&O x 4, placed on NRB at 15L, SpO2 improved to 100%.

## 2017-06-26 NOTE — ED Notes (Addendum)
Patient transported to CT. Escorted by this RN and RT.

## 2017-06-26 NOTE — ED Notes (Signed)
Pt placed on 2L [place] per Dr. Cathleen Fears at bedside

## 2017-06-27 ENCOUNTER — Inpatient Hospital Stay (HOSPITAL_COMMUNITY): Payer: Medicare Other

## 2017-06-27 DIAGNOSIS — Z6841 Body Mass Index (BMI) 40.0 and over, adult: Secondary | ICD-10-CM | POA: Diagnosis not present

## 2017-06-27 DIAGNOSIS — M545 Low back pain: Secondary | ICD-10-CM | POA: Diagnosis present

## 2017-06-27 DIAGNOSIS — I5030 Unspecified diastolic (congestive) heart failure: Secondary | ICD-10-CM

## 2017-06-27 DIAGNOSIS — Z720 Tobacco use: Secondary | ICD-10-CM | POA: Diagnosis not present

## 2017-06-27 DIAGNOSIS — I878 Other specified disorders of veins: Secondary | ICD-10-CM | POA: Diagnosis present

## 2017-06-27 DIAGNOSIS — D72829 Elevated white blood cell count, unspecified: Secondary | ICD-10-CM | POA: Diagnosis not present

## 2017-06-27 DIAGNOSIS — Z9981 Dependence on supplemental oxygen: Secondary | ICD-10-CM | POA: Diagnosis not present

## 2017-06-27 DIAGNOSIS — J9622 Acute and chronic respiratory failure with hypercapnia: Secondary | ICD-10-CM

## 2017-06-27 DIAGNOSIS — G894 Chronic pain syndrome: Secondary | ICD-10-CM | POA: Diagnosis present

## 2017-06-27 DIAGNOSIS — E662 Morbid (severe) obesity with alveolar hypoventilation: Secondary | ICD-10-CM | POA: Diagnosis present

## 2017-06-27 DIAGNOSIS — I10 Essential (primary) hypertension: Secondary | ICD-10-CM | POA: Diagnosis not present

## 2017-06-27 DIAGNOSIS — E875 Hyperkalemia: Secondary | ICD-10-CM | POA: Diagnosis present

## 2017-06-27 DIAGNOSIS — N179 Acute kidney failure, unspecified: Secondary | ICD-10-CM | POA: Diagnosis present

## 2017-06-27 DIAGNOSIS — J9621 Acute and chronic respiratory failure with hypoxia: Secondary | ICD-10-CM | POA: Diagnosis present

## 2017-06-27 DIAGNOSIS — J418 Mixed simple and mucopurulent chronic bronchitis: Secondary | ICD-10-CM | POA: Diagnosis not present

## 2017-06-27 DIAGNOSIS — I890 Lymphedema, not elsewhere classified: Secondary | ICD-10-CM | POA: Diagnosis present

## 2017-06-27 DIAGNOSIS — I130 Hypertensive heart and chronic kidney disease with heart failure and stage 1 through stage 4 chronic kidney disease, or unspecified chronic kidney disease: Secondary | ICD-10-CM | POA: Diagnosis present

## 2017-06-27 DIAGNOSIS — I5033 Acute on chronic diastolic (congestive) heart failure: Secondary | ICD-10-CM

## 2017-06-27 DIAGNOSIS — N182 Chronic kidney disease, stage 2 (mild): Secondary | ICD-10-CM

## 2017-06-27 DIAGNOSIS — R29702 NIHSS score 2: Secondary | ICD-10-CM | POA: Diagnosis present

## 2017-06-27 DIAGNOSIS — G9341 Metabolic encephalopathy: Secondary | ICD-10-CM | POA: Diagnosis present

## 2017-06-27 DIAGNOSIS — E872 Acidosis: Secondary | ICD-10-CM | POA: Diagnosis present

## 2017-06-27 DIAGNOSIS — K219 Gastro-esophageal reflux disease without esophagitis: Secondary | ICD-10-CM | POA: Diagnosis present

## 2017-06-27 DIAGNOSIS — E039 Hypothyroidism, unspecified: Secondary | ICD-10-CM | POA: Diagnosis present

## 2017-06-27 DIAGNOSIS — E876 Hypokalemia: Secondary | ICD-10-CM | POA: Diagnosis not present

## 2017-06-27 DIAGNOSIS — J449 Chronic obstructive pulmonary disease, unspecified: Secondary | ICD-10-CM | POA: Diagnosis present

## 2017-06-27 DIAGNOSIS — J9602 Acute respiratory failure with hypercapnia: Secondary | ICD-10-CM | POA: Diagnosis present

## 2017-06-27 DIAGNOSIS — R402413 Glasgow coma scale score 13-15, at hospital admission: Secondary | ICD-10-CM | POA: Diagnosis present

## 2017-06-27 DIAGNOSIS — F329 Major depressive disorder, single episode, unspecified: Secondary | ICD-10-CM | POA: Diagnosis present

## 2017-06-27 LAB — I-STAT ARTERIAL BLOOD GAS, ED
Acid-Base Excess: 3 mmol/L — ABNORMAL HIGH (ref 0.0–2.0)
Acid-Base Excess: 6 mmol/L — ABNORMAL HIGH (ref 0.0–2.0)
Acid-Base Excess: 2 mmol/L (ref 0.0–2.0)
Bicarbonate: 32.6 mmol/L — ABNORMAL HIGH (ref 20.0–28.0)
Bicarbonate: 35.2 mmol/L — ABNORMAL HIGH (ref 20.0–28.0)
Bicarbonate: 30.2 mmol/L — ABNORMAL HIGH (ref 20.0–28.0)
O2 Saturation: 98 %
O2 Saturation: 99 %
O2 Saturation: 99 %
Patient temperature: 98.6
Patient temperature: 98.6
Patient temperature: 98.6
TCO2: 35 mmol/L — ABNORMAL HIGH (ref 22–32)
TCO2: 37 mmol/L — ABNORMAL HIGH (ref 22–32)
TCO2: 32 mmol/L (ref 22–32)
pCO2 arterial: 72.3 mmHg (ref 32.0–48.0)
pCO2 arterial: 75.4 mmHg (ref 32.0–48.0)
pCO2 arterial: 61.8 mmHg — ABNORMAL HIGH (ref 32.0–48.0)
pH, Arterial: 7.261 — ABNORMAL LOW (ref 7.350–7.450)
pH, Arterial: 7.277 — ABNORMAL LOW (ref 7.350–7.450)
pH, Arterial: 7.297 — ABNORMAL LOW (ref 7.350–7.450)
pO2, Arterial: 136 mmHg — ABNORMAL HIGH (ref 83.0–108.0)
pO2, Arterial: 136 mmHg — ABNORMAL HIGH (ref 83.0–108.0)
pO2, Arterial: 134 mmHg — ABNORMAL HIGH (ref 83.0–108.0)

## 2017-06-27 LAB — BASIC METABOLIC PANEL
Anion gap: 12 (ref 5–15)
Anion gap: 12 (ref 5–15)
Anion gap: 12 (ref 5–15)
BUN: 29 mg/dL — ABNORMAL HIGH (ref 6–20)
BUN: 30 mg/dL — ABNORMAL HIGH (ref 6–20)
BUN: 31 mg/dL — ABNORMAL HIGH (ref 6–20)
CO2: 25 mmol/L (ref 22–32)
CO2: 27 mmol/L (ref 22–32)
CO2: 28 mmol/L (ref 22–32)
Calcium: 9.1 mg/dL (ref 8.9–10.3)
Calcium: 9.2 mg/dL (ref 8.9–10.3)
Calcium: 8.6 mg/dL — ABNORMAL LOW (ref 8.9–10.3)
Chloride: 99 mmol/L — ABNORMAL LOW (ref 101–111)
Chloride: 98 mmol/L — ABNORMAL LOW (ref 101–111)
Chloride: 99 mmol/L — ABNORMAL LOW (ref 101–111)
Creatinine, Ser: 2.19 mg/dL — ABNORMAL HIGH (ref 0.44–1.00)
Creatinine, Ser: 2.59 mg/dL — ABNORMAL HIGH (ref 0.44–1.00)
Creatinine, Ser: 2.21 mg/dL — ABNORMAL HIGH (ref 0.44–1.00)
GFR calc Af Amer: 26 mL/min — ABNORMAL LOW (ref >60)
GFR calc Af Amer: 21 mL/min — ABNORMAL LOW (ref >60)
GFR calc Af Amer: 26 mL/min — ABNORMAL LOW (ref >60)
GFR calc non Af Amer: 22 mL/min — ABNORMAL LOW (ref >60)
GFR calc non Af Amer: 18 mL/min — ABNORMAL LOW (ref >60)
GFR calc non Af Amer: 22 mL/min — ABNORMAL LOW (ref >60)
Glucose, Bld: 85 mg/dL (ref 65–99)
Glucose, Bld: 102 mg/dL — ABNORMAL HIGH (ref 65–99)
Glucose, Bld: 75 mg/dL (ref 65–99)
Potassium: 5.8 mmol/L — ABNORMAL HIGH (ref 3.5–5.1)
Potassium: 5.9 mmol/L — ABNORMAL HIGH (ref 3.5–5.1)
Potassium: 4.1 mmol/L (ref 3.5–5.1)
Sodium: 136 mmol/L (ref 135–145)
Sodium: 137 mmol/L (ref 135–145)
Sodium: 139 mmol/L (ref 135–145)

## 2017-06-27 LAB — ECHOCARDIOGRAM COMPLETE: Weight: 5393.33 oz

## 2017-06-27 LAB — POCT I-STAT 3, ART BLOOD GAS (G3+)
Acid-Base Excess: 5 mmol/L — ABNORMAL HIGH (ref 0.0–2.0)
Bicarbonate: 33.4 mmol/L — ABNORMAL HIGH (ref 20.0–28.0)
O2 Saturation: 95 %
TCO2: 35 mmol/L — ABNORMAL HIGH (ref 22–32)
pCO2 arterial: 69.6 mmHg (ref 32.0–48.0)
pH, Arterial: 7.289 — ABNORMAL LOW (ref 7.350–7.450)
pO2, Arterial: 89 mmHg (ref 83.0–108.0)

## 2017-06-27 LAB — I-STAT VENOUS BLOOD GAS, ED
Acid-Base Excess: 3 mmol/L — ABNORMAL HIGH (ref 0.0–2.0)
Bicarbonate: 32.9 mmol/L — ABNORMAL HIGH (ref 20.0–28.0)
O2 Saturation: 90 %
TCO2: 35 mmol/L — ABNORMAL HIGH (ref 22–32)
pCO2, Ven: 76.6 mmHg (ref 44.0–60.0)
pH, Ven: 7.24 — ABNORMAL LOW (ref 7.250–7.430)
pO2, Ven: 72 mmHg — ABNORMAL HIGH (ref 32.0–45.0)

## 2017-06-27 LAB — CBC
HCT: 40.6 % (ref 36.0–46.0)
Hemoglobin: 12.1 g/dL (ref 12.0–15.0)
MCH: 27.1 pg (ref 26.0–34.0)
MCHC: 29.8 g/dL — ABNORMAL LOW (ref 30.0–36.0)
MCV: 90.8 fL (ref 78.0–100.0)
Platelets: 220 10*3/uL (ref 150–400)
RBC: 4.47 MIL/uL (ref 3.87–5.11)
RDW: 15.8 % — ABNORMAL HIGH (ref 11.5–15.5)
WBC: 18 10*3/uL — ABNORMAL HIGH (ref 4.0–10.5)

## 2017-06-27 LAB — RAPID URINE DRUG SCREEN, HOSP PERFORMED
Amphetamines: NOT DETECTED
Barbiturates: NOT DETECTED
Benzodiazepines: POSITIVE — AB
Cocaine: NOT DETECTED
Opiates: NOT DETECTED
Tetrahydrocannabinol: NOT DETECTED

## 2017-06-27 LAB — MRSA PCR SCREENING: MRSA by PCR: POSITIVE — AB

## 2017-06-27 LAB — GLUCOSE, CAPILLARY
Glucose-Capillary: 111 mg/dL — ABNORMAL HIGH (ref 65–99)
Glucose-Capillary: 82 mg/dL (ref 65–99)
Glucose-Capillary: 73 mg/dL (ref 65–99)
Glucose-Capillary: 75 mg/dL (ref 65–99)
Glucose-Capillary: 74 mg/dL (ref 65–99)

## 2017-06-27 LAB — TROPONIN I
Troponin I: <0.03 ng/mL (ref <0.03)
Troponin I: <0.03 ng/mL (ref <0.03)

## 2017-06-27 LAB — URINALYSIS, ROUTINE W REFLEX MICROSCOPIC
Bacteria, UA: NONE SEEN
Bilirubin Urine: NEGATIVE
Glucose, UA: NEGATIVE mg/dL
Ketones, ur: NEGATIVE mg/dL
Leukocytes, UA: NEGATIVE
Nitrite: NEGATIVE
Protein, ur: 30 mg/dL — AB
Specific Gravity, Urine: 1.014 (ref 1.005–1.030)
pH: 5 (ref 5.0–8.0)

## 2017-06-27 LAB — MAGNESIUM: Magnesium: 2.4 mg/dL (ref 1.7–2.4)

## 2017-06-27 LAB — CREATININE, URINE, RANDOM: Creatinine, Urine: 94.17 mg/dL

## 2017-06-27 LAB — CBG MONITORING, ED: Glucose-Capillary: 93 mg/dL (ref 65–99)

## 2017-06-27 LAB — PHOSPHORUS: Phosphorus: 6.1 mg/dL — ABNORMAL HIGH (ref 2.5–4.6)

## 2017-06-27 MED ORDER — SODIUM CHLORIDE 0.9 % IV SOLN
1.0000 g | Freq: Once | INTRAVENOUS | Status: AC
  Administered 2017-06-27: 1 g via INTRAVENOUS
  Filled 2017-06-27: qty 10

## 2017-06-27 MED ORDER — HEPARIN SODIUM (PORCINE) 5000 UNIT/ML IJ SOLN
5000.0000 [IU] | Freq: Three times a day (TID) | INTRAMUSCULAR | Status: DC
Start: 2017-06-27 — End: 2017-07-04
  Administered 2017-06-27 – 2017-07-04 (×21): 5000 [IU] via SUBCUTANEOUS
  Filled 2017-06-27 (×23): qty 1

## 2017-06-27 MED ORDER — SODIUM CHLORIDE 0.9 % IV SOLN: 250.0000 mL | INTRAVENOUS | Status: DC | PRN

## 2017-06-27 MED ORDER — IPRATROPIUM-ALBUTEROL 0.5-2.5 (3) MG/3ML IN SOLN
3.0000 mL | RESPIRATORY_TRACT | Status: DC
  Administered 2017-06-27 – 2017-06-28 (×7): 3 mL via RESPIRATORY_TRACT
  Filled 2017-06-27 (×7): qty 3

## 2017-06-27 MED ORDER — FAMOTIDINE IN NACL 20-0.9 MG/50ML-% IV SOLN
20.0000 mg | Freq: Two times a day (BID) | INTRAVENOUS | Status: DC
  Administered 2017-06-27 (×2): 20 mg via INTRAVENOUS
  Filled 2017-06-27 (×2): qty 50

## 2017-06-27 MED ORDER — SODIUM POLYSTYRENE SULFONATE 15 GM/60ML PO SUSP
30.0000 g | Freq: Once | ORAL | Status: AC
  Administered 2017-06-27: 30 g via RECTAL
  Filled 2017-06-27: qty 120

## 2017-06-27 MED ORDER — ALBUTEROL SULFATE (2.5 MG/3ML) 0.083% IN NEBU
10.0000 mg | INHALATION_SOLUTION | Freq: Once | RESPIRATORY_TRACT | Status: AC
  Administered 2017-06-27: 10 mg via RESPIRATORY_TRACT
  Filled 2017-06-27: qty 12

## 2017-06-27 MED ORDER — ALBUTEROL SULFATE (2.5 MG/3ML) 0.083% IN NEBU: 2.5000 mg | INHALATION_SOLUTION | RESPIRATORY_TRACT | Status: DC | PRN

## 2017-06-27 MED ORDER — DEXTROSE 50 % IV SOLN
50.0000 mL | INTRAVENOUS | Status: DC | PRN
  Filled 2017-06-27 (×2): qty 50

## 2017-06-27 MED ORDER — SODIUM POLYSTYRENE SULFONATE 15 GM/60ML PO SUSP
30.0000 g | Freq: Once | ORAL | Status: DC
  Filled 2017-06-27: qty 120

## 2017-06-27 MED ORDER — SODIUM CHLORIDE 0.9% FLUSH: 3.0000 mL | INTRAVENOUS | Status: DC | PRN

## 2017-06-27 MED ORDER — INSULIN ASPART 100 UNIT/ML IV SOLN
10.0000 [IU] | Freq: Once | INTRAVENOUS | Status: AC
  Administered 2017-06-27: 10 [IU] via INTRAVENOUS

## 2017-06-27 MED ORDER — LACTULOSE ENEMA
300.0000 mL | Freq: Two times a day (BID) | ORAL | Status: DC
  Administered 2017-06-27: 300 mL via RECTAL
  Filled 2017-06-27 (×4): qty 300

## 2017-06-27 MED ORDER — ORAL CARE MOUTH RINSE
15.0000 mL | Freq: Two times a day (BID) | OROMUCOSAL | Status: DC
  Administered 2017-06-28 – 2017-06-29 (×4): 15 mL via OROMUCOSAL

## 2017-06-27 MED ORDER — ACETAMINOPHEN 650 MG RE SUPP: 650.0000 mg | Freq: Four times a day (QID) | RECTAL | Status: DC | PRN

## 2017-06-27 MED ORDER — NALOXONE HCL 0.4 MG/ML IJ SOLN
0.4000 mg | Freq: Once | INTRAMUSCULAR | Status: AC
Start: 2017-06-27 — End: 2017-06-27
  Administered 2017-06-27: 0.4 mg via INTRAVENOUS
  Filled 2017-06-27: qty 1

## 2017-06-27 MED ORDER — INSULIN ASPART 100 UNIT/ML ~~LOC~~ SOLN
10.0000 [IU] | Freq: Once | SUBCUTANEOUS | Status: AC
  Administered 2017-06-27: 10 [IU] via SUBCUTANEOUS

## 2017-06-27 MED ORDER — FUROSEMIDE 10 MG/ML IJ SOLN
80.0000 mg | Freq: Two times a day (BID) | INTRAMUSCULAR | Status: DC
  Administered 2017-06-27: 80 mg via INTRAVENOUS
  Filled 2017-06-27: qty 8

## 2017-06-27 MED ORDER — ENOXAPARIN SODIUM 40 MG/0.4ML ~~LOC~~ SOLN
40.0000 mg | SUBCUTANEOUS | Status: DC
  Filled 2017-06-27: qty 0.4

## 2017-06-27 MED ORDER — FLUMAZENIL 0.5 MG/5ML IV SOLN
0.2000 mg | Freq: Once | INTRAVENOUS | Status: AC
  Administered 2017-06-27: 0.2 mg via INTRAVENOUS
  Filled 2017-06-27: qty 5

## 2017-06-27 MED ORDER — ASPIRIN EC 81 MG PO TBEC
81.0000 mg | DELAYED_RELEASE_TABLET | Freq: Every day | ORAL | Status: DC
  Administered 2017-06-28 – 2017-07-04 (×7): 81 mg via ORAL
  Filled 2017-06-27 (×8): qty 1

## 2017-06-27 MED ORDER — FAMOTIDINE IN NACL 20-0.9 MG/50ML-% IV SOLN
20.0000 mg | INTRAVENOUS | Status: DC
  Administered 2017-06-28: 20 mg via INTRAVENOUS
  Filled 2017-06-27 (×2): qty 50

## 2017-06-27 MED ORDER — LEVOTHYROXINE SODIUM 100 MCG IV SOLR
75.0000 ug | Freq: Every day | INTRAVENOUS | Status: DC
  Administered 2017-06-27: 75 ug via INTRAVENOUS
  Filled 2017-06-27 (×2): qty 5

## 2017-06-27 MED ORDER — BUDESONIDE 0.5 MG/2ML IN SUSP
0.5000 mg | Freq: Two times a day (BID) | RESPIRATORY_TRACT | Status: DC
  Administered 2017-06-27 – 2017-07-04 (×14): 0.5 mg via RESPIRATORY_TRACT
  Filled 2017-06-27 (×16): qty 2

## 2017-06-27 MED ORDER — NICOTINE POLACRILEX 2 MG MT GUM: 4.0000 mg | CHEWING_GUM | OROMUCOSAL | Status: DC | PRN

## 2017-06-27 MED ORDER — INSULIN ASPART 100 UNIT/ML ~~LOC~~ SOLN
5.0000 [IU] | Freq: Once | SUBCUTANEOUS | Status: AC
  Administered 2017-06-27: 5 [IU] via INTRAVENOUS
  Filled 2017-06-27: qty 1

## 2017-06-27 MED ORDER — FUROSEMIDE 10 MG/ML IJ SOLN: 40.0000 mg | Freq: Once | INTRAMUSCULAR | Status: DC

## 2017-06-27 MED ORDER — SODIUM CHLORIDE 0.9% FLUSH
3.0000 mL | Freq: Two times a day (BID) | INTRAVENOUS | Status: DC
  Administered 2017-06-27: 3 mL via INTRAVENOUS
  Administered 2017-06-27: 22:00:00 via INTRAVENOUS
  Administered 2017-06-28 – 2017-07-04 (×13): 3 mL via INTRAVENOUS

## 2017-06-27 MED ORDER — INSULIN ASPART 100 UNIT/ML ~~LOC~~ SOLN
2.0000 [IU] | SUBCUTANEOUS | Status: DC
  Administered 2017-06-28 – 2017-06-29 (×5): 2 [IU] via SUBCUTANEOUS
  Administered 2017-06-29: 4 [IU] via SUBCUTANEOUS
  Administered 2017-06-29: 2 [IU] via SUBCUTANEOUS

## 2017-06-27 MED ORDER — ARFORMOTEROL TARTRATE 15 MCG/2ML IN NEBU
15.0000 ug | INHALATION_SOLUTION | Freq: Two times a day (BID) | RESPIRATORY_TRACT | Status: DC
  Administered 2017-06-27 – 2017-07-04 (×14): 15 ug via RESPIRATORY_TRACT
  Filled 2017-06-27 (×16): qty 2

## 2017-06-27 MED ORDER — PERFLUTREN LIPID MICROSPHERE
INTRAVENOUS | Status: AC
  Filled 2017-06-27: qty 10

## 2017-06-27 MED ORDER — ONDANSETRON HCL 4 MG/2ML IJ SOLN
4.0000 mg | Freq: Four times a day (QID) | INTRAMUSCULAR | Status: DC | PRN
  Administered 2017-06-30: 4 mg via INTRAVENOUS
  Filled 2017-06-27: qty 2

## 2017-06-27 MED ORDER — FUROSEMIDE 10 MG/ML IJ SOLN
80.0000 mg | Freq: Three times a day (TID) | INTRAMUSCULAR | Status: DC
  Administered 2017-06-27 – 2017-06-29 (×6): 80 mg via INTRAVENOUS
  Filled 2017-06-27 (×8): qty 8

## 2017-06-27 MED ORDER — HYDRALAZINE HCL 20 MG/ML IJ SOLN: 5.0000 mg | INTRAMUSCULAR | Status: DC | PRN

## 2017-06-27 MED ORDER — FUROSEMIDE 10 MG/ML IJ SOLN
40.0000 mg | Freq: Once | INTRAMUSCULAR | Status: AC
  Administered 2017-06-27: 40 mg via INTRAVENOUS
  Filled 2017-06-27: qty 4

## 2017-06-27 MED ORDER — DEXTROSE 50 % IV SOLN
1.0000 | Freq: Once | INTRAVENOUS | Status: AC
  Administered 2017-06-27: 50 mL via INTRAVENOUS
  Filled 2017-06-27: qty 50

## 2017-06-27 MED ORDER — PERFLUTREN LIPID MICROSPHERE
1.0000 mL | INTRAVENOUS | Status: AC | PRN
  Administered 2017-06-27: 4 mL via INTRAVENOUS
  Filled 2017-06-27: qty 10

## 2017-06-27 MED ORDER — CHLORHEXIDINE GLUCONATE 0.12 % MT SOLN
15.0000 mL | Freq: Two times a day (BID) | OROMUCOSAL | Status: DC
  Administered 2017-06-27 – 2017-06-28 (×2): 15 mL via OROMUCOSAL
  Filled 2017-06-27 (×4): qty 15

## 2017-06-27 NOTE — Progress Notes (Addendum)
PULMONARY  / CRITICAL CARE MEDICINE  Name: Morgan Odonnell MRN: [id number] DOB: [birth year]    LOS: 0  REFERRING MD :  Dr. Blaine Hamper   CHIEF COMPLAINT:  Encephalopathy and hypercapneic respiratory failure   BRIEF PATIENT DESCRIPTION: 67 yo F with PMH COPD on 2L O2, OSA not on CPAP, OHS, chronic pain on Norco, CKD II, HFpEF, hyporthyroidism, morbid obesity and chronic lymphedema who presented with acute encephalopathy in the setting of acute on chronic hypercapneic respiratory failure likely multifactorial from polypharmacy, HF exacerbation, and non-compliance with CPAP.   LINES / TUBES: 4/18 >  LUE PIV and foley cathter   CULTURES: None   ANTIBIOTICS: None   SIGNIFICANT EVENTS:  4/18 > Placed on BiPAP and admitted to the ICU   INTERVAL HISTORY: Patient remains on BiPAP and reports breathing is better. No complaints this AM.   VITAL SIGNS: Temp:  [97.1 F (36.2 C)-98.9 F (37.2 C)] 98.9 F (37.2 C) (04/19 0815) Pulse Rate:  [61-69] 68 (04/19 0745) Resp:  [5-18] 16 (04/19 0745) BP: (101-141)/(66-98) 117/80 (04/19 0730) SpO2:  [94 %-99 %] 96 % (04/19 0745) FiO2 (%):  [40 %] 40 % (04/19 0745) HEMODYNAMICS:   VENTILATOR SETTINGS: FiO2 (%):  [40 %] 40 % INTAKE / OUTPUT: Intake/Output      04/18 0701 - 04/19 0700 04/19 0701 - 04/20 0700   IV Piggyback 50    Total Intake 50    Urine 350    Total Output 350    Net -300           PHYSICAL EXAMINATION: General:  Appears comfortable in bed Neuro:  Alert and oriented, follows commands, moves all 4 extremities spontaneously  HEENT:  NCAT, PERRL  Cardiovascular:  Distant heart sounds due to body habitus  Lungs:  Difficult exam due to body habitus, decreased breath sounds at the bases through mid lung fields  Abdomen:  Obese, soft, nontender, decreased bowel sounds  Musculoskeletal:  No deformities, 3+ edema bilaterally  Skin:  Bilateral lymphedema    LABS: Cbc Recent Labs  Lab 06/26/17 2131  WBC 10.7*  HGB 12.7  HCT  42.9  PLT 217    Chemistry  Recent Labs  Lab 06/26/17 2131 06/27/17 0424  NA 136 136  K 5.4* 5.8*  CL 98* 99*  CO2 26 25  BUN 24* 29*  CREATININE 2.09* 2.19*  CALCIUM 9.2 9.1  GLUCOSE 113* 85    Liver fxn Recent Labs  Lab 06/26/17 2131  AST 26  ALT 18  ALKPHOS 122  BILITOT 0.8  PROT 8.5*  ALBUMIN 3.5   coags No results for input(s): APTT, INR in the last 168 hours. Sepsis markers No results for input(s): LATICACIDVEN, PROCALCITON in the last 168 hours. Cardiac markers Recent Labs  Lab 06/26/17 2123 06/27/17 0214  CKTOTAL 54  --   TROPONINI  --  <0.03   BNP No results for input(s): PROBNP in the last 168 hours. ABG Recent Labs  Lab 06/27/17 0059 06/27/17 0229 06/27/17 0556  PHART 7.261* 7.277* 7.297*  PCO2ART 72.3* 75.4* 61.8*  PO2ART 136.0* 136.0* 134.0*  HCO3 32.6* 35.2* 30.2*  TCO2 35* 37* 32    CBG trend Recent Labs  Lab 06/27/17 0443  GLUCAP 93    IMAGING: 1. CXR with pulmonary edema  2. Head CT negative for acute processes   ECG:  4/18 NSR, low voltage, nl intervals, QTc 486ms, no signs of acute ischemia  DIAGNOSES: Principal Problem:   Acute on chronic respiratory  failure with hypoxia and hypercapnia (HCC) Active Problems:   COPD (chronic obstructive pulmonary disease)[place]   Hypertension   Chronic diastolic CHF (congestive heart failure) (HCC)   Lymphedema   Morbid obesity with BMI of 50.0-59.9, adult (HCC)   Tobacco abuse   Acute on chronic diastolic CHF (congestive heart failure) (HCC)   Obesity hypoventilation syndrome (HCC)   Hyperkalemia   Acute renal failure superimposed on stage 2 chronic kidney disease (HCC)   Acute metabolic encephalopathy   Acute respiratory failure with hypercapnia (HCC)   ASSESSMENT / PLAN:  PULMONARY A: Acute on chronic hypercapnic respiratory failure - likely multifactorial from polypharmacy, HF exacerbation, and non-compliance with CPAP. Head CT negative  COPD on chronic 2 L nasal  cannula OSA not on CPAP  Obesity hypoventilation syndrome  P:   - Continue BiPAP, low threshold for intubation if patient desires  - S/p Narcan x2 and flumazenil x1  - Brovana and Pulmicort twice daily - Duo nebs every 4 hours - Avoid/hold sedating medications   CARDIOVASCULAR A:  Heart failure with preserved ejection fracture - appears volume overloaded on exam and CXR with pulmonary edema  Hypertension  P:  - Follow up 2D echo - Repeat CXR 4/20 AM  - IV Lasix 80 mg TID  - Strict I's and O's and daily weights - Hydralazine as needed  RENAL A:   AKI on CKD II - likely cardiorenal syndrome 2/2 noncompliance with Lasix at home  Hyperkalemia - K 5.8, no EKG changes   P:   - S/p IV Lasix 40 + 80 mg with ?UOP 350cc.  - IV Lasix 80 mg TID  - Novolog 10U + D50 amp and repeat BMP to check K  - Continue to monitor lytes and UOP  - Follow up BMP, Mag, and Phos   GASTROINTESTINAL A:   GERD Elevated ammonia level  P:   - S/p lactulose enema - IV famotidine BID   HEMATOLOGIC A:   No active issues   P:  - Follow CBCs - SQ heparin for VTE ppx   INFECTIOUS A:   No active issues   P:   - Monitor white count and fever curve  ENDOCRINE A:   Hypothyroidism  P:   - Synthroid IV while on BiPAP  - Sliding-scale insulin with CBG monitoring q4h   NEUROLOGIC A:   Acute encephalopathy secondary to acute on chronicn hypercapnic respiratory failure as above  Chronic pain on chronic opiate therapy - reports on Norco at home last took 4/19 AM, but UDS negative for opiates and positive for benzos which she is not on   P:   - Now alert and oriented  - Treat hypercapnia as above - Avoid/hold sedating meds   Unsure if she would like to be intubated and would like to discuss further with husband. She does want to receive chest compressions, shocks and medications.    Welford Roche, MD  Internal Medicine PGY-1  P [phone] 06/27/2017, 8:20 AM

## 2017-06-27 NOTE — Consult Note (Signed)
PULMONARY / CRITICAL CARE MEDICINE   Name: Morgan Odonnell MRN: [id number] DOB: 1950/08/26    ADMISSION DATE:  06/26/2017 CONSULTATION DATE:  06/27/2017  REFERRING MD:  Dr. Blaine Hamper  CHIEF COMPLAINT: Acute on chronic hypercapnic respiratory failure  HISTORY OF PRESENT ILLNESS:   Morgan Odonnell is a 67 year old female with a past medical history significant for COPD on chronic 2 L nasal cannula, OSA not on CPAP, obesity hypoventilation syndrome, CKD stage II, heart failure with preserved ejection fracture, hypothyroidism who presented to the emergency room with altered mental status.  Per the husband at bedside she was her normal self this morning when he left however upon returning this afternoon he found her sitting upright in her chair minimally responsive.  She would wake up to stimuli but would quickly fall back asleep.  He does note that since starting Lyrica 3-4 weeks ago she has had increasing somnolence and has been sleeping longer.  Reports that she has had some complaints of her chronic knee and back pain but otherwise has not complained of any other symptoms.  No fevers, chills, cough, nausea, vomiting, diarrhea, abdominal pain.  The husband reports that she has been out of her Norco for the past week or so however the patient reports she did take it yesterday.  Additionally, she notes that she has missed doses of her inhalers recently as well as missing doses of her Lasix and taking it only as needed for swelling.  In the emergency department her ABG of 7.3/60/99/29 and was placed on BiPAP.  She was given Narcan and IV thiamine without any response.  Her somnolence persisted and on repeat ABG she had worsening of her hypercapnia with her ABG 7.2 6/72/136/32 while on BiPAP settings of 16/8.  Labs were also notable for a potassium of 5.4 and creatinine of 2.09 up from her baseline of 0.9.  UDS was negative for opiates however was positive for benzos.  Patient subsequent denies that she takes any  benzos and appear on her medication list.  Chest x-ray was notable for cardiomegaly and interstitial prominence suggestive of pulmonary edema.  P CCM was consulted for admission to ICU.  PAST MEDICAL HISTORY :  She  has a past medical history of Chronic cough, COPD (chronic obstructive pulmonary disease) ([place]), GERD (gastroesophageal reflux disease), Heart murmur, Hypertension, Leg edema, Low back pain, Morbid obesity ([place]), Obesity hypoventilation syndrome ([place]), Oral lesion, Shortness of breath, and Snoring.  PAST SURGICAL HISTORY: She  has a past surgical history that includes Meniscus repair; Cholecystectomy; Appendectomy; Tubal ligation; Ectopic pregnancy surgery; Tonsillectomy; and Colonoscopy (N/A, 06/25/2013).  Allergies  Allergen Reactions  . Gabapentin     "slept for 2 days"  . Novocain [Procaine] Other (See Comments)    intolerance    No current facility-administered medications on file prior to encounter.    Current Outpatient Medications on File Prior to Encounter  Medication Sig  . albuterol (PROVENTIL HFA;VENTOLIN HFA) 108 (90 BASE) MCG/ACT inhaler Inhale 2 puffs into the lungs every 6 (six) hours as needed for wheezing.   . baclofen (LIORESAL) 10 MG tablet Take 1 tablet by mouth 4 (four) times daily as needed for muscle spasms.   . citalopram (CELEXA) 40 MG tablet Take 40 mg by mouth daily.  . diclofenac sodium (VOLTAREN) 1 % GEL Apply 1 application topically 4 (four) times daily. Applied to knees, shoulders, wrists, and ankles  . furosemide (LASIX) 40 MG tablet 2 tablets (80mg ) by mouth two times a day  .  HYDROcodone-acetaminophen (NORCO/VICODIN) 5-325 MG per tablet Take 1-2 tablets by mouth every 4 (four) hours as needed. (Patient taking differently: Take 1-2 tablets by mouth every 4 (four) hours as needed for moderate pain. )  . levothyroxine (SYNTHROID, LEVOTHROID) 150 MCG tablet Take 150 mcg by mouth daily.  . metoprolol tartrate (LOPRESSOR) 25 MG tablet Take 1 tablet  by mouth 2 (two) times daily.  . nicotine polacrilex (NICORETTE) 4 MG gum Take 4 mg by mouth as needed for smoking cessation.  . potassium chloride SA (K-DUR,KLOR-CON) 20 MEQ tablet 2 tablets (40 mEq) by mouth in the AM and 1 tablet (20 mEq) by mouth in the PM  . pregabalin (LYRICA) 150 MG capsule Take 50 mg by mouth 2 (two) times daily.   . ranitidine (ZANTAC) 150 MG tablet Take 1 tablet (150 mg total) by mouth at bedtime.  Marland Kitchen Spacer/Aero-Holding Chambers (AEROCHAMBER Z-STAT PLUS CHAMBR) MISC Use as directed  . SPIRIVA HANDIHALER 18 MCG inhalation capsule PLACE 1 CAPSULE (18 MCG TOTAL) INTO INHALER AND INHALE DAILY.  . SYMBICORT 160-4.5 MCG/ACT inhaler INHALE TWO puffs into lung TWICE DAILY  . tamsulosin (FLOMAX) 0.4 MG CAPS capsule Take 0.4 mg by mouth daily. Reported on 08/18/2015  . verapamil (CALAN) 120 MG tablet Take 120 mg by mouth 2 (two) times daily.    FAMILY HISTORY:  Her indicated that her mother is deceased. She indicated that her father is deceased. She indicated that the status of her brother is unknown. She indicated that the status of her daughter is unknown. She indicated that the status of her maternal aunt is unknown.   SOCIAL HISTORY: She  reports that she quit smoking about 2 years ago. Her smoking use included cigarettes. She has a 90.00 pack-year smoking history. She has never used smokeless tobacco. She reports that she does not drink alcohol or use drugs.  REVIEW OF SYSTEMS:   Unable to obtain this patient is unable to provide history due to altered mental status  SUBJECTIVE:  Patient is resting comfortably in bed somnolent but arousable to voice.  VITAL SIGNS: BP 110/75   Pulse 68   Temp (!) 97.1 F (36.2 C) (Rectal)   Resp 10   SpO2 97%   HEMODYNAMICS:    VENTILATOR SETTINGS: FiO2 (%):  [40 %] 40 %  INTAKE / OUTPUT: No intake/output data recorded.  PHYSICAL EXAMINATION: General: Morbidly obese chronically ill appearing female lying in bed somnolent  on BiPAP Neuro: Hypercapnic jerks, moving all extremities spontaneously, follows simple commands, alert to person only HEENT: Pupils equal round reactive, normal conjunctiva Cardiovascular: Distant heart sounds, regular rate and rhythm, no murmurs Lungs: Clear to auscultation anteriorly, no increased work of breathing, good airflow movement Abdomen: Soft, nondistended, nontender, bowel sounds present Musculoskeletal: 3+ pitting edema bilaterally Skin: Warm and dry, venous stasis changes on lower extremities bilaterally  LABS:  BMET Recent Labs  Lab 06/26/17 2131  NA 136  K 5.4*  CL 98*  CO2 26  BUN 24*  CREATININE 2.09*  GLUCOSE 113*    Electrolytes Recent Labs  Lab 06/26/17 2131  CALCIUM 9.2    CBC Recent Labs  Lab 06/26/17 2131  WBC 10.7*  HGB 12.7  HCT 42.9  PLT 217    Coag's No results for input(s): APTT, INR in the last 168 hours.  Sepsis Markers No results for input(s): LATICACIDVEN, PROCALCITON, O2SATVEN in the last 168 hours.  ABG Recent Labs  Lab 06/26/17 2131 06/27/17 0059 06/27/17 0229  PHART 7.301* 7.261* 7.277*  PCO2ART 60.0* 72.3* 75.4*  PO2ART 99.0 136.0* 136.0*    Liver Enzymes Recent Labs  Lab 06/26/17 2131  AST 26  ALT 18  ALKPHOS 122  BILITOT 0.8  ALBUMIN 3.5    Cardiac Enzymes Recent Labs  Lab 06/27/17 0214  TROPONINI <0.03    Glucose No results for input(s): GLUCAP in the last 168 hours.  Imaging Ct Head Wo Contrast  Result Date: 06/27/2017 CLINICAL DATA:  Acute onset of altered mental status. Lethargy. EXAM: CT HEAD WITHOUT CONTRAST TECHNIQUE: Contiguous axial images were obtained from the base of the skull through the vertex without intravenous contrast. COMPARISON:  CT of the head performed 05/17/2010 FINDINGS: Brain: No evidence of acute infarction, hemorrhage, hydrocephalus, extra-axial collection or mass lesion/mass effect. Mild periventricular white matter change likely reflects small vessel ischemic  microangiopathy. The posterior fossa, including the cerebellum, brainstem and fourth ventricle, is within normal limits. The third and lateral ventricles, and basal ganglia are unremarkable in appearance. The cerebral hemispheres are symmetric in appearance, with normal gray-white differentiation. No mass effect or midline shift is seen. Vascular: No hyperdense vessel or unexpected calcification. Skull: There is no evidence of fracture; visualized osseous structures are unremarkable in appearance. Sinuses/Orbits: The orbits are within normal limits. The paranasal sinuses and mastoid air cells are well-aerated. Other: No significant soft tissue abnormalities are seen. IMPRESSION: 1. No acute intracranial pathology seen on CT. 2. Mild small vessel ischemic microangiopathy. Electronically Signed   By: Garald Balding M.D.   On: 06/27/2017 00:06   Dg Chest Port 1 View  Result Date: 06/26/2017 CLINICAL DATA:  Lethargy.  History of COPD. EXAM: PORTABLE CHEST 1 VIEW COMPARISON:  Chest radiograph May 01, 2015 FINDINGS: Cardiac silhouette is similarly enlarged. Mediastinal silhouette is nonsuspicious. Diffuse interstitial prominence. LEFT lower lung zone similar scarring. Small LEFT pleural effusion. No pneumothorax. Soft tissue planes and included osseous structures are nonsuspicious. IMPRESSION: Cardiomegaly. Interstitial prominence seen with pulmonary edema, probable background of COPD. Small LEFT pleural effusion. Electronically Signed   By: Elon Alas M.D.   On: 06/26/2017 22:13    STUDIES:  CT head:  1.No acute intracranial pathology seen on CT. 2. Mild small vessel ischemic microangiopathy.  CXR: Cardiomegaly. Interstitial prominence seen with pulmonary edema, probable background of COPD. Small LEFT pleural effusion.  CULTURES: None  ANTIBIOTICS: None  SIGNIFICANT EVENTS: 4/19: Admission for worsening hypercapnia and persistent somnolence despite BiPAP    LINES/TUBES: 4/18  PIV 4/18 [place]  DISCUSSION: 67 year old morbidly obese female with oxygen dependent COPD, OSA not on CPAP, obesity hypoventilation syndrome presenting with increasing somnolence and acute on chronic hypercapnia that has been unresponsive to BiPAP  ASSESSMENT / PLAN:  PULMONARY A: Acute on chronic hypercapnic respiratory failure - likely multifactorial.  Some concerns for oversedation from opioids.  Patient reports taking them yesterday however UDS negative.  She was initially given Narcan without response.  UDS was however positive for benzos though none listed on her medication list. COPD on chronic 2 L nasal cannula OSA Obesity hypoventilation syndrome  P:   Continue BiPAP though patient high risk for need for intubation Chest x-ray in a.m. ABG Additional dose of Narcan Flumazenil Brovana and Pulmicort twice daily Duo nebs every 4 hours  CARDIOVASCULAR A:  Heart failure with preserved ejection fracture -volume overloaded on exam and on chest x-ray Hypertension  P:  2D echo Repeat chest x-ray in a.m. Lasix Strict I's and O's and daily weights Hydralazine as needed  RENAL A:   AKI on CKD  II Hyperkalemia  P:   Diurese Kayexalate Follow bmet, mag, phos Follow UOP  GASTROINTESTINAL A:   GERD Elevated ammonia level  P:   Lactulose enema Pepcid twice daily  HEMATOLOGIC A:   None  P:  Follow CBCs  INFECTIOUS A:   None  P:   Monitor white count and fever curve  ENDOCRINE A:   Hypothyroidism  P:   Synthroid IV Monitor CBGs every 4 Sliding-scale insulin  NEUROLOGIC A:   Acute encephalopathy secondary to hypercapnia  P:   Treat hypercapnia as above   FAMILY  - Updates: Patient's husband at bedside and updated.  He was hesitant to determine CODE STATUS although he did indicate that she may not wish to be intubated.  However for now he wishes her to remain full code while he discusses with his family members.  - Inter-disciplinary family  meet or Palliative Care meeting due by:  4/26    Pulmonary and [place] Pager: [phone]  06/27/2017, 3:55 AM

## 2017-06-27 NOTE — Progress Notes (Signed)
Spoke with patient, husband, and son at bedside. Interval ABG continues to show respiratory acidosis. The patient is more alert now and able to converse with family. She is alert and oriented x 3. She is thirsty.   We discussed whether the patient would want to be intubated if her respiratory status did not improve. She did confirm that she would want to be intubated. This was confirmed multiple times throughout our conversation. Family at bedside agrees.   Physical exam is unchanged compared to Dr. Lorane Gell. We will follow-up on her BMP this afternoon.

## 2017-06-27 NOTE — Progress Notes (Signed)
  Echocardiogram 2D Echocardiogram has been performed.  Morgan Odonnell 06/27/2017, 3:52 PM

## 2017-06-27 NOTE — Progress Notes (Signed)
Patient transported to 2M01 from the emergency department without any apparent complications. Receiving RT aware patient is on the unit.

## 2017-06-27 NOTE — Progress Notes (Signed)
Pt refused lactulose enema. Pt educated on the importance of this med. Pt states "no I'm not doing it, I don't want it."

## 2017-06-27 NOTE — H&P (Signed)
History and Physical    Morgan Odonnell:[id number] DOB: 06/25/1950 DOA: 06/26/2017  Referring MD/NP/PA:   PCP: Leanna Battles, MD   Patient coming from:  The patient is coming from home.  At baseline, pt is independent for most of ADL.  Chief Complaint: AMS and SOB  HPI: Morgan Odonnell is a 67 y.o. female with medical history significant of COPD, chronic respiratory failure on 2 L nasal cannula oxygen at home, OSA, hyperventilation syndrome not on CPAP, morbid obesity, CKD-2, lymphedema, dCHF, GERD, hypothyroidism, depression, who presents with altered mental status and SOB.  Per Her husband, recently her doctor increased her lyric dosage several weeks ago. She became more lethargic and has been sleeping more since she started taking the increased the dose of Lyrica. This has worsened since this morning. Patient is confused, very lethargic, with respiratory distress and shortness of breath since this AM. Patient has mild cough, but no chest pain. No fever or chills. She did not complain nausea, vomiting, diarrhea or abdominal pain per her husband. She moves all extremities. Per EMS, she had oxygen desaturation to 88% on 2 L nasal cannula oxygen. Per her husband, patient was on Norco for pain, but ran out of this medication for several days.  ED Course: pt was found to have WBC 10.7, BNP 190.9, worsening renal function, potassium 5.4, temperature normal, no tachycardia, negative CT had full acute intracranial abnormalities. Chest x-ray is consistence with pulmonary edema. Patient was treated with one dose of Narcan without improvement of her mental status. Pt was started on BiPAP. ABG with pH of 7.301, PO2 60, PCO2 79, several hours later on BiPAP, ABG 7.261, PaCO2 72.3, PO2 136. Pt is admitted to stepdown as inpatient. PCCM , Dr. Lucile Shutters was consulted  Review of Systems: could not be accurately reviewed due to altered mental status.  Allergy:  Allergies  Allergen Reactions  .  Gabapentin     "slept for 2 days"  . Novocain [Procaine] Other (See Comments)    intolerance    Past Medical History:  Diagnosis Date  . Chronic cough   . COPD (chronic obstructive pulmonary disease) ([place])   . GERD (gastroesophageal reflux disease)   . Heart murmur   . Hypertension   . Leg edema    chronic, bilateral  . Low back pain   . Morbid obesity ([place])   . Obesity hypoventilation syndrome ([place])   . Oral lesion   . Shortness of breath   . Snoring     Past Surgical History:  Procedure Laterality Date  . APPENDECTOMY    . CHOLECYSTECTOMY    . COLONOSCOPY N/A 06/25/2013   Procedure: COLONOSCOPY;  Surgeon: [name], MD;  Location: [facility];  Service: Endoscopy;  Laterality: N/A;  . ECTOPIC PREGNANCY SURGERY    . MENISCUS REPAIR    . TONSILLECTOMY    . TUBAL LIGATION      Social History:  reports that she quit smoking about 2 years ago. Her smoking use included cigarettes. She has a 90.00 pack-year smoking history. She has never used smokeless tobacco. She reports that she does not drink alcohol or use drugs.  Family History:  Family History  Problem Relation Age of Onset  . Ovarian cancer Mother   . Lung disease Mother   . Heart failure Mother   . Lymphoma Brother   . Ovarian cancer Maternal Aunt   . Asthma Daughter      Prior to Admission medications   Medication Sig  Start Date End Date Taking? Authorizing Provider  albuterol (PROVENTIL HFA;VENTOLIN HFA) 108 (90 BASE) MCG/ACT inhaler Inhale 2 puffs into the lungs every 6 (six) hours as needed for wheezing.     [provider]  baclofen (LIORESAL) 10 MG tablet Take 1 tablet by mouth 4 (four) times daily as needed for muscle spasms.     [provider]  citalopram (CELEXA) 40 MG tablet Take 40 mg by mouth daily. 02/11/15   [provider]  diclofenac sodium (VOLTAREN) 1 % GEL Apply 1 application topically 4 (four) times daily. Applied to knees, shoulders, wrists, and ankles     [provider]  furosemide (LASIX) 40 MG tablet 2 tablets (80mg ) by mouth two times a day 08/18/15   Larey Dresser, MD  HYDROcodone-acetaminophen (NORCO/VICODIN) 5-325 MG per tablet Take 1-2 tablets by mouth every 4 (four) hours as needed. Patient taking differently: Take 1-2 tablets by mouth every 4 (four) hours as needed for moderate pain.  05/26/14   Waynetta Pean, PA-C  levothyroxine (SYNTHROID, LEVOTHROID) 150 MCG tablet Take 150 mcg by mouth daily.    [provider]  metoprolol tartrate (LOPRESSOR) 25 MG tablet Take 1 tablet by mouth 2 (two) times daily. 11/23/12   [provider]  nicotine polacrilex (NICORETTE) 4 MG gum Take 4 mg by mouth as needed for smoking cessation.    [provider]  potassium chloride SA (K-DUR,KLOR-CON) 20 MEQ tablet 2 tablets (40 mEq) by mouth in the AM and 1 tablet (20 mEq) by mouth in the PM 10/09/16   Larey Dresser, MD  pregabalin (LYRICA) 150 MG capsule Take 50 mg by mouth 2 (two) times daily.     [provider]  ranitidine (ZANTAC) 150 MG tablet Take 1 tablet (150 mg total) by mouth at bedtime. 01/16/17   Javier Glazier, MD  Spacer/Aero-Holding Chambers (AEROCHAMBER Z-STAT PLUS [place]) MISC Use as directed 05/10/15   Javier Glazier, MD  SPIRIVA HANDIHALER 18 MCG inhalation capsule PLACE 1 CAPSULE (18 MCG TOTAL) INTO INHALER AND INHALE DAILY. 06/27/14   Parrett, Fonnie Mu, NP  SYMBICORT 160-4.5 MCG/ACT inhaler INHALE TWO puffs into lung TWICE DAILY 10/09/16   Javier Glazier, MD  tamsulosin (FLOMAX) 0.4 MG CAPS capsule Take 0.4 mg by mouth daily. Reported on 08/18/2015    [provider]  verapamil (CALAN) 120 MG tablet Take 120 mg by mouth 2 (two) times daily.    [provider]    Physical Exam: Vitals:   06/27/17 0030 06/27/17 0045 06/27/17 0100 06/27/17 0115  BP: 131/86 112/81 122/78 127/80  Pulse: 68 65 67 65  Resp: (!) 9 13 14    Temp:      TempSrc:      SpO2: 98% 98% 98% 94%    General: Not in acute distress HEENT:       Eyes: PERRL, EOMI, no scleral icterus.       ENT: No discharge from the ears and nose, no pharynx injection, no tonsillar enlargement.        Neck: No JVD, no bruit, no mass felt. Heme: No neck lymph node enlargement. Cardiac: S1/S2, RRR, No murmurs, No gallops or rubs. Respiratory: decreased air movement bilaterally. No rales, wheezing, rhonchi or rubs. GI: Soft, nondistended, nontender, no rebound pain, no organomegaly, BS present. GU: No hematuria Ext: No pitting leg edema bilaterally. 2+DP/PT pulse bilaterally. Musculoskeletal: No joint deformities, No joint redness or warmth, no limitation of ROM in spin. Skin: No rashes.  Neuro: very lethargic, partially follow commands, arousable, still oriented 3, cranial nerves II-XII grossly intact, moves all extremities. Psych: Patient is not psychotic, no suicidal or hemocidal ideation.  Labs on Admission: I have personally reviewed following labs and imaging studies  CBC: Recent Labs  Lab 06/26/17 2131  WBC 10.7*  NEUTROABS 7.1  HGB 12.7  HCT 42.9  MCV 91.3  PLT 294   Basic Metabolic Panel: Recent Labs  Lab 06/26/17 2131  NA 136  K 5.4*  CL 98*  CO2 26  GLUCOSE 113*  BUN 24*  CREATININE 2.09*  CALCIUM 9.2   GFR: CrCl cannot be calculated (Unknown ideal weight.). Liver Function Tests: Recent Labs  Lab 06/26/17 2131  AST 26  ALT 18  ALKPHOS 122  BILITOT 0.8  PROT 8.5*  ALBUMIN 3.5   No results for input(s): LIPASE, AMYLASE in the last 168 hours. Recent Labs  Lab 06/26/17 2132  AMMONIA 61*   Coagulation Profile: No results for input(s): INR, PROTIME in the last 168 hours. Cardiac Enzymes: Recent Labs  Lab 06/26/17 2123  CKTOTAL 54   BNP (last 3 results) No results for input(s): PROBNP in the last 8760 hours. HbA1C: No results for input(s): HGBA1C in the last 72 hours. CBG: No results for input(s): GLUCAP in the last 168 hours. Lipid Profile: No  results for input(s): CHOL, HDL, LDLCALC, TRIG, CHOLHDL, LDLDIRECT in the last 72 hours. Thyroid Function Tests: No results for input(s): TSH, T4TOTAL, FREET4, T3FREE, THYROIDAB in the last 72 hours. Anemia Panel: No results for input(s): VITAMINB12, FOLATE, FERRITIN, TIBC, IRON, RETICCTPCT in the last 72 hours. Urine analysis:    Component Value Date/Time   COLORURINE YELLOW 06/27/2017 0045   APPEARANCEUR HAZY (A) 06/27/2017 0045   LABSPEC 1.014 06/27/2017 0045   PHURINE 5.0 06/27/2017 0045   GLUCOSEU NEGATIVE 06/27/2017 0045   HGBUR SMALL (A) 06/27/2017 0045   BILIRUBINUR NEGATIVE 06/27/2017 0045   KETONESUR NEGATIVE 06/27/2017 0045   PROTEINUR 30 (A) 06/27/2017 0045   UROBILINOGEN 0.2 04/01/2007 1526   NITRITE NEGATIVE 06/27/2017 0045   LEUKOCYTESUR NEGATIVE 06/27/2017 0045   Sepsis Labs: @LABRCNTIP (procalcitonin:4,lacticidven:4) )No results found for this or any previous visit (from the past 240 hour(s)).   Radiological Exams on Admission: Ct Head Wo Contrast  Result Date: 06/27/2017 CLINICAL DATA:  Acute onset of altered mental status. Lethargy. EXAM: CT HEAD WITHOUT CONTRAST TECHNIQUE: Contiguous axial images were obtained from the base of the skull through the vertex without intravenous contrast. COMPARISON:  CT of the head performed 05/17/2010 FINDINGS: Brain: No evidence of acute infarction, hemorrhage, hydrocephalus, extra-axial collection or mass lesion/mass effect. Mild periventricular white matter change likely reflects small vessel ischemic microangiopathy. The posterior fossa, including the cerebellum, brainstem and fourth ventricle, is within normal limits. The third and lateral ventricles, and basal ganglia are unremarkable in appearance. The cerebral hemispheres are symmetric in appearance, with normal gray-white differentiation. No mass effect or midline shift is seen. Vascular: No hyperdense vessel or unexpected calcification. Skull: There is no evidence of fracture;  visualized osseous structures are unremarkable in appearance. Sinuses/Orbits: The orbits are within normal limits. The paranasal sinuses and mastoid air cells are well-aerated. Other: No significant soft tissue abnormalities are seen. IMPRESSION: 1. No acute intracranial pathology seen on CT. 2. Mild small vessel ischemic microangiopathy. Electronically Signed   By: Garald Balding M.D.   On: 06/27/2017 00:06   Dg Chest Port 1 View  Result Date: 06/26/2017 CLINICAL DATA:  Lethargy.  History of COPD. EXAM:  PORTABLE CHEST 1 VIEW COMPARISON:  Chest radiograph May 01, 2015 FINDINGS: Cardiac silhouette is similarly enlarged. Mediastinal silhouette is nonsuspicious. Diffuse interstitial prominence. LEFT lower lung zone similar scarring. Small LEFT pleural effusion. No pneumothorax. Soft tissue planes and included osseous structures are nonsuspicious. IMPRESSION: Cardiomegaly. Interstitial prominence seen with pulmonary edema, probable background of COPD. Small LEFT pleural effusion. Electronically Signed   By: Elon Alas M.D.   On: 06/26/2017 22:13     EKG: Independently reviewed.  Sinus rhythm, QTC 445, low voltage, poor R-wave progression, nonspecific T-wave change.    Assessment/Plan Principal Problem:   Acute on chronic respiratory failure with hypoxia and hypercapnia (HCC) Active Problems:   COPD (chronic obstructive pulmonary disease)Gold C   Hypertension   Chronic diastolic CHF (congestive heart failure) (HCC)   Lymphedema   Morbid obesity with BMI of 50.0-59.9, adult (HCC)   Tobacco abuse   Acute on chronic diastolic CHF (congestive heart failure) (HCC)   Obesity hypoventilation syndrome (HCC)   Hyperkalemia   Acute renal failure superimposed on stage 2 chronic kidney disease (HCC)   Acute metabolic encephalopathy   Acute on chronic respiratory failure with hypoxia and hypercapnia ([place]): Etiology is not clear. Likely multifactorial etiology, including CHF exacerbation,  obesity hypoventilation syndrome, COPP, overdose of sedative medication. UDS negative for opiate, but positive for benzos though none listed on her medication list. Patient has severe lymphedema, BNP 190, chest chest showed pulmonary edema, clinically consistence with CHF exacerbation. Pt did not respond to Narcan.  PCCM was consulted  -will admit to [facility] as inpt -continue BiPAP -IV lasix 80 mg bid - Duoneb nebs and prn albuterol nebs. -f/u PCCM recommendations.  Acute on chronic diastolic CHF: 2-D echo on 03/03/15 showed EF of 55-60%. -see above for lasix  - f/u 2d echo - trop x 3 -ASA and metoprolol  COPD (chronic obstructive pulmonary disease)Gold C: - Duoneb nebs and prn albuterol nebs.  HTN:  -IV hydralazine prn -hold home med due to AMS  Tobacco abuse: -nicotine patch  Obesity hypoventilation syndrome ([place]): -on BiPAP  Hyperkalemia: Potassium 5.4 without mild T-wave peaking -treated with D50, 5 unit of Novolog by IV,  -30 g of Kayexalate -f/u by BMP  Acute renal failure superimposed on stage 2 chronic kidney disease ([place]): Likely cardiorenal syndrome -check FeUrea - Follow up renal function by BMP - treat CHF exacerbation as above - f/u UA to r/o UTI  Acute metabolic encephalopathy: Likely multifactorial etiology, including increase dose of lyric, benzo use, hypoxia, and electrolytes disturbance. Her ammonia level is elevated, but no history of cirrhosis. -will start lactulose 30 g twice a day -Frequent neuro check -correct electrolytes. -Hold sedative medications.   DVT ppx: SQ Lovenox Code Status: Full code Family Communication:   Yes, patient's husband at bed side Disposition Plan:  Anticipate discharge back to previous home environment Consults called:  PCCM, Dr. Lucile Shutters Admission status:  SDU/inpation       Date of Service 06/27/2017    Ivor Costa [facility] Pager [phone]  If 7PM-7AM, please contact night-coverage [email or web address] Password  [email or web address] 06/27/2017, 1:59 AM

## 2017-06-28 ENCOUNTER — Inpatient Hospital Stay (HOSPITAL_COMMUNITY): Payer: Medicare Other

## 2017-06-28 DIAGNOSIS — J9621 Acute and chronic respiratory failure with hypoxia: Secondary | ICD-10-CM

## 2017-06-28 LAB — CBC
HCT: 38.2 % (ref 36.0–46.0)
Hemoglobin: 11.4 g/dL — ABNORMAL LOW (ref 12.0–15.0)
MCH: 26.7 pg (ref 26.0–34.0)
MCHC: 29.8 g/dL — ABNORMAL LOW (ref 30.0–36.0)
MCV: 89.5 fL (ref 78.0–100.0)
Platelets: 192 10*3/uL (ref 150–400)
RBC: 4.27 MIL/uL (ref 3.87–5.11)
RDW: 15.5 % (ref 11.5–15.5)
WBC: 13.5 10*3/uL — ABNORMAL HIGH (ref 4.0–10.5)

## 2017-06-28 LAB — BASIC METABOLIC PANEL
Anion gap: 13 (ref 5–15)
Anion gap: 12 (ref 5–15)
BUN: 26 mg/dL — ABNORMAL HIGH (ref 6–20)
BUN: 25 mg/dL — ABNORMAL HIGH (ref 6–20)
CO2: 31 mmol/L (ref 22–32)
CO2: 31 mmol/L (ref 22–32)
Calcium: 8.3 mg/dL — ABNORMAL LOW (ref 8.9–10.3)
Calcium: 8.4 mg/dL — ABNORMAL LOW (ref 8.9–10.3)
Chloride: 93 mmol/L — ABNORMAL LOW (ref 101–111)
Chloride: 91 mmol/L — ABNORMAL LOW (ref 101–111)
Creatinine, Ser: 1.67 mg/dL — ABNORMAL HIGH (ref 0.44–1.00)
Creatinine, Ser: 1.48 mg/dL — ABNORMAL HIGH (ref 0.44–1.00)
GFR calc Af Amer: 36 mL/min — ABNORMAL LOW (ref >60)
GFR calc Af Amer: 42 mL/min — ABNORMAL LOW (ref >60)
GFR calc non Af Amer: 31 mL/min — ABNORMAL LOW (ref >60)
GFR calc non Af Amer: 36 mL/min — ABNORMAL LOW (ref >60)
Glucose, Bld: 83 mg/dL (ref 65–99)
Glucose, Bld: 116 mg/dL — ABNORMAL HIGH (ref 65–99)
Potassium: 3.5 mmol/L (ref 3.5–5.1)
Potassium: 3.3 mmol/L — ABNORMAL LOW (ref 3.5–5.1)
Sodium: 137 mmol/L (ref 135–145)
Sodium: 134 mmol/L — ABNORMAL LOW (ref 135–145)

## 2017-06-28 LAB — GLUCOSE, CAPILLARY
Glucose-Capillary: 89 mg/dL (ref 65–99)
Glucose-Capillary: 86 mg/dL (ref 65–99)
Glucose-Capillary: 108 mg/dL — ABNORMAL HIGH (ref 65–99)
Glucose-Capillary: 134 mg/dL — ABNORMAL HIGH (ref 65–99)
Glucose-Capillary: 145 mg/dL — ABNORMAL HIGH (ref 65–99)

## 2017-06-28 LAB — MAGNESIUM: Magnesium: 2 mg/dL (ref 1.7–2.4)

## 2017-06-28 LAB — UREA NITROGEN, URINE: Urea Nitrogen, Ur: 196 mg/dL

## 2017-06-28 LAB — HIV ANTIBODY (ROUTINE TESTING W REFLEX): HIV Screen 4th Generation wRfx: NONREACTIVE

## 2017-06-28 LAB — AMMONIA: Ammonia: 33 umol/L (ref 9–35)

## 2017-06-28 MED ORDER — IPRATROPIUM-ALBUTEROL 0.5-2.5 (3) MG/3ML IN SOLN
3.0000 mL | Freq: Four times a day (QID) | RESPIRATORY_TRACT | Status: DC
  Administered 2017-06-28 – 2017-06-29 (×3): 3 mL via RESPIRATORY_TRACT
  Filled 2017-06-28 (×3): qty 3

## 2017-06-28 MED ORDER — PREGABALIN 100 MG PO CAPS
100.0000 mg | ORAL_CAPSULE | Freq: Two times a day (BID) | ORAL | Status: DC
  Administered 2017-06-28 – 2017-07-04 (×13): 100 mg via ORAL
  Filled 2017-06-28 (×3): qty 1
  Filled 2017-06-28 (×3): qty 2
  Filled 2017-06-28 (×6): qty 1
  Filled 2017-06-28: qty 2
  Filled 2017-06-28: qty 1

## 2017-06-28 MED ORDER — METOPROLOL TARTRATE 12.5 MG HALF TABLET
12.5000 mg | ORAL_TABLET | Freq: Two times a day (BID) | ORAL | Status: DC
  Administered 2017-06-28 – 2017-07-04 (×13): 12.5 mg via ORAL
  Filled 2017-06-28 (×16): qty 1

## 2017-06-28 MED ORDER — PREGABALIN 75 MG PO CAPS: 150.0000 mg | ORAL_CAPSULE | Freq: Two times a day (BID) | ORAL | Status: DC

## 2017-06-28 MED ORDER — CITALOPRAM HYDROBROMIDE 20 MG PO TABS
40.0000 mg | ORAL_TABLET | Freq: Every day | ORAL | Status: DC
  Administered 2017-06-28 – 2017-06-29 (×2): 40 mg via ORAL
  Filled 2017-06-28 (×2): qty 1
  Filled 2017-06-28: qty 2

## 2017-06-28 MED ORDER — LEVOTHYROXINE SODIUM 100 MCG PO TABS
200.0000 ug | ORAL_TABLET | Freq: Every day | ORAL | Status: DC
  Administered 2017-06-28 – 2017-07-04 (×7): 200 ug via ORAL
  Filled 2017-06-28: qty 1
  Filled 2017-06-28 (×2): qty 2
  Filled 2017-06-28: qty 1
  Filled 2017-06-28 (×3): qty 2
  Filled 2017-06-28: qty 1
  Filled 2017-06-28: qty 2

## 2017-06-28 NOTE — Progress Notes (Signed)
RT placed patient on BIPAP HS. Patient tolerating well. 

## 2017-06-28 NOTE — Progress Notes (Signed)
PULMONARY  / CRITICAL CARE MEDICINE  Name: Morgan Odonnell MRN: [id number] DOB: [birth year]    LOS: 26  REFERRING MD :  Dr. Blaine Hamper   CHIEF COMPLAINT:  Encephalopathy and hypercapneic respiratory failure   BRIEF PATIENT DESCRIPTION: 67 yo F with PMH COPD on 2L O2, OSA not on CPAP, OHS, chronic pain on Norco, CKD II, HFpEF, hyporthyroidism, morbid obesity and chronic lymphedema who presented with acute encephalopathy in the setting of acute on chronic hypercapneic respiratory failure likely multifactorial from polypharmacy, HF exacerbation, and non-compliance with CPAP.   LINES / TUBES: 4/18 >  LUE PIV and foley cathter   CULTURES: None   ANTIBIOTICS: None   SIGNIFICANT EVENTS:  4/18 > Placed on BiPAP and admitted to the ICU   INTERVAL HISTORY:  Patient did not wear BiPAP overnight last night. She tells me that she does not have CPAP or BiPAP at home -her most recent polysomnogram was done in 2014, did not confirm any obstructive sleep apnea but she does have obesity hypoventilation syndrome.  VITAL SIGNS: Temp:  [98 F (36.7 C)-98.8 F (37.1 C)] 98 F (36.7 C) (04/20 0731) Pulse Rate:  [59-91] 91 (04/20 0800) Resp:  [8-17] 14 (04/20 0800) BP: (73-148)/(30-94) 118/65 (04/20 0800) SpO2:  [90 %-98 %] 97 % (04/20 0827) FiO2 (%):  [40 %] 40 % (04/19 1904) Weight:  [149.8 kg (330 lb 4 oz)] 149.8 kg (330 lb 4 oz) (04/20 0421) HEMODYNAMICS:   VENTILATOR SETTINGS: FiO2 (%):  [40 %] 40 % INTAKE / OUTPUT: Intake/Output      04/19 0701 - 04/20 0700 04/20 0701 - 04/21 0700   I.V. (mL/kg) 0 (0)    Other 100    IV Piggyback 0    Total Intake(mL/kg) 100 (0.7)    Urine (mL/kg/hr) 3195 (0.9)    Stool 0    Total Output 3195    Net -3095         Stool Occurrence 1 x      PHYSICAL EXAMINATION: General: Morbidly obese woman, lying comfortably Neuro: Awake alert appropriate, moves all extremities, strong voice, strong cough HEENT: Huge neck, oropharynx clear, no  stridor Cardiovascular: Very distant, regular, no murmur Lungs: Again very distant, decreased everywhere especially at bases, no apparent crackles or wheezing Abdomen: Obese, soft, nontender positive bowel sounds Musculoskeletal: No deformities, 2+ edema to her thighs Skin: Bilateral lymphedema changes   LABS: Cbc Recent Labs  Lab 06/26/17 2131 06/27/17 0840 06/28/17 0700  WBC 10.7* 18.0* 13.5*  HGB 12.7 12.1 11.4*  HCT 42.9 40.6 38.2  PLT 217 220 192    Chemistry  Recent Labs  Lab 06/27/17 0424 06/27/17 0840 06/27/17 1142 06/27/17 1820  NA 136  --  137 139  K 5.8*  --  5.9* 4.1  CL 99*  --  98* 99*  CO2 25  --  27 28  BUN 29*  --  30* 31*  CREATININE 2.19*  --  2.59* 2.21*  CALCIUM 9.1  --  9.2 8.6*  MG  --  2.4  --   --   PHOS  --  6.1*  --   --   GLUCOSE 85  --  102* 75    Liver fxn Recent Labs  Lab 06/26/17 2131  AST 26  ALT 18  ALKPHOS 122  BILITOT 0.8  PROT 8.5*  ALBUMIN 3.5   coags No results for input(s): APTT, INR in the last 168 hours. Sepsis markers No results for input(s): LATICACIDVEN, PROCALCITON in  the last 168 hours. Cardiac markers Recent Labs  Lab 06/26/17 2123 06/27/17 0214 06/27/17 0840  CKTOTAL 54  --   --   TROPONINI  --  <0.03 <0.03   BNP No results for input(s): PROBNP in the last 168 hours. ABG Recent Labs  Lab 06/27/17 0229 06/27/17 0556 06/27/17 1339  PHART 7.277* 7.297* 7.289*  PCO2ART 75.4* 61.8* 69.6*  PO2ART 136.0* 134.0* 89.0  HCO3 35.2* 30.2* 33.4*  TCO2 37* 32 35*    CBG trend Recent Labs  Lab 06/27/17 1532 06/27/17 1916 06/27/17 2313 06/28/17 0322 06/28/17 0728  GLUCAP 73 75 74 89 86    IMAGING: 1. CXR with pulmonary edema  2. Head CT negative for acute processes   ECG:  4/18 NSR, low voltage, nl intervals, QTc 441ms, no signs of acute ischemia  DIAGNOSES: Principal Problem:   Acute on chronic respiratory failure with hypoxia and hypercapnia (HCC) Active Problems:   COPD (chronic  obstructive pulmonary disease)Gold C   Hypertension   Chronic diastolic CHF (congestive heart failure) (HCC)   Lymphedema   Morbid obesity with BMI of 50.0-59.9, adult (HCC)   Tobacco abuse   Acute on chronic diastolic CHF (congestive heart failure) (HCC)   Obesity hypoventilation syndrome (HCC)   Hyperkalemia   Acute renal failure superimposed on stage 2 chronic kidney disease (HCC)   Acute metabolic encephalopathy   Acute respiratory failure with hypercapnia (HCC)   ASSESSMENT / PLAN:  PULMONARY A: Acute on chronic hypercapnic respiratory failure - likely multifactorial from polypharmacy, HF exacerbation, and non-compliance with CPAP. Head CT negative  COPD on chronic 2 L nasal cannula Probable OSA, but not documented on her most recent PSG from 2014 Obesity hypoventilation syndrome  P:   She almost certainly needs to wear BiPAP nightly.  I will encourage her to do so. She will need a repeat polysomnogram as an outpatient to reassess for obstructive sleep apnea.  She will need BiPAP nightly, versus a trilogy device for her obesity hypoventilation syndrome Continue Brovana and Pulmicort as ordered Continue DuoNeb, decrease frequency to 4 times daily Avoid sedating medications if at all possible  CARDIOVASCULAR A:  Heart failure with preserved ejection fracture - appears volume overloaded on exam and CXR with pulmonary edema  -TTE 4/19 >> normal LV size and ejection fraction, consistent with grade 2 diastolic dysfunction, trivial AI moderately dilated left atrium, right ventricle, unable to estimate PAP Hypertension  P:  Follow chest x-ray Continue Lasix 80 every 8 hours for now, morning BMP is pending, may need to adjust depending on renal function Strict I&O Hydralazine ordered as needed Restart metoprolol at half her usual home dose, 12.5 mg twice daily Home verapamil still on hold  RENAL A:   AKI on CKD II - likely cardiorenal syndrome 2/2 noncompliance with  Lasix at home  Hyperkalemia -repeat BMP pending 4/20  P:   Continue current IV Lasix dosing pending BMP results Treat hyperkalemia if indicated   GASTROINTESTINAL A:   GERD Elevated ammonia level  P:    hold lactulose at this time, convert to p.o. if she does need it again Pepcid IV for now, convert to p.o. soon Okay to start heart healthy carb modified diet  HEMATOLOGIC A:   Mild leukocytosis  P:  Follow CBC Subcutaneous heparin for DVT prophylaxis  INFECTIOUS A:   No active issues   P:   Following clinically, no evidence of active infection  ENDOCRINE A:   Hypothyroidism  P:   Convert Synthroid  back to p.o. Sliding scale insulin as ordered  NEUROLOGIC A:   Acute encephalopathy secondary to acute on chronicn hypercapnic respiratory failure as above  Chronic pain on chronic opiate therapy - reports on Norco at home last took 4/19 AM, but UDS negative for opiates and positive for benzos which she is not on   P:   Restart home Celexa Restart home Lyrica at decreased dose, 100 mg twice daily Avoid sedating medications, avoid baclofen, narcotics  Baltazar Apo, MD, PhD 06/28/2017, 9:34 AM [facility] [phone] or if no answer [phone]

## 2017-06-28 NOTE — Progress Notes (Signed)
Patient is refusing to wear BIPAP anymore. RT placed patient back 2L [place].

## 2017-06-28 NOTE — Progress Notes (Signed)
Patient resting comfortably on 2L nasal cannula upon arrival to do AM nebulizer treatment.  Bipap currently not indicated at this time.  Will continue to monitor and assess for bipap needs.

## 2017-06-29 ENCOUNTER — Other Ambulatory Visit: Payer: Self-pay

## 2017-06-29 LAB — BASIC METABOLIC PANEL
Anion gap: 12 (ref 5–15)
Anion gap: 11 (ref 5–15)
BUN: 20 mg/dL (ref 6–20)
BUN: 17 mg/dL (ref 6–20)
CO2: 34 mmol/L — ABNORMAL HIGH (ref 22–32)
CO2: 34 mmol/L — ABNORMAL HIGH (ref 22–32)
Calcium: 8.6 mg/dL — ABNORMAL LOW (ref 8.9–10.3)
Calcium: 8.8 mg/dL — ABNORMAL LOW (ref 8.9–10.3)
Chloride: 90 mmol/L — ABNORMAL LOW (ref 101–111)
Chloride: 88 mmol/L — ABNORMAL LOW (ref 101–111)
Creatinine, Ser: 1.18 mg/dL — ABNORMAL HIGH (ref 0.44–1.00)
Creatinine, Ser: 1.12 mg/dL — ABNORMAL HIGH (ref 0.44–1.00)
GFR calc Af Amer: 55 mL/min — ABNORMAL LOW (ref >60)
GFR calc Af Amer: 58 mL/min — ABNORMAL LOW (ref >60)
GFR calc non Af Amer: 47 mL/min — ABNORMAL LOW (ref >60)
GFR calc non Af Amer: 50 mL/min — ABNORMAL LOW (ref >60)
Glucose, Bld: 109 mg/dL — ABNORMAL HIGH (ref 65–99)
Glucose, Bld: 130 mg/dL — ABNORMAL HIGH (ref 65–99)
Potassium: 3 mmol/L — ABNORMAL LOW (ref 3.5–5.1)
Potassium: 3.3 mmol/L — ABNORMAL LOW (ref 3.5–5.1)
Sodium: 136 mmol/L (ref 135–145)
Sodium: 133 mmol/L — ABNORMAL LOW (ref 135–145)

## 2017-06-29 LAB — GLUCOSE, CAPILLARY
Glucose-Capillary: 122 mg/dL — ABNORMAL HIGH (ref 65–99)
Glucose-Capillary: 125 mg/dL — ABNORMAL HIGH (ref 65–99)
Glucose-Capillary: 133 mg/dL — ABNORMAL HIGH (ref 65–99)
Glucose-Capillary: 136 mg/dL — ABNORMAL HIGH (ref 65–99)
Glucose-Capillary: 154 mg/dL — ABNORMAL HIGH (ref 65–99)
Glucose-Capillary: 88 mg/dL (ref 65–99)

## 2017-06-29 LAB — CBC
HCT: 41.2 % (ref 36.0–46.0)
Hemoglobin: 12.6 g/dL (ref 12.0–15.0)
MCH: 27 pg (ref 26.0–34.0)
MCHC: 30.6 g/dL (ref 30.0–36.0)
MCV: 88.4 fL (ref 78.0–100.0)
Platelets: 203 10*3/uL (ref 150–400)
RBC: 4.66 MIL/uL (ref 3.87–5.11)
RDW: 15.5 % (ref 11.5–15.5)
WBC: 9.9 10*3/uL (ref 4.0–10.5)

## 2017-06-29 LAB — MAGNESIUM: Magnesium: 2.1 mg/dL (ref 1.7–2.4)

## 2017-06-29 MED ORDER — IPRATROPIUM-ALBUTEROL 0.5-2.5 (3) MG/3ML IN SOLN
3.0000 mL | Freq: Two times a day (BID) | RESPIRATORY_TRACT | Status: DC
  Administered 2017-06-29 – 2017-07-04 (×10): 3 mL via RESPIRATORY_TRACT
  Filled 2017-06-29 (×10): qty 3

## 2017-06-29 MED ORDER — POTASSIUM CHLORIDE CRYS ER 20 MEQ PO TBCR
40.0000 meq | EXTENDED_RELEASE_TABLET | Freq: Two times a day (BID) | ORAL | Status: AC
  Administered 2017-06-29 (×2): 40 meq via ORAL
  Filled 2017-06-29 (×2): qty 2

## 2017-06-29 MED ORDER — FAMOTIDINE 20 MG PO TABS
20.0000 mg | ORAL_TABLET | Freq: Every day | ORAL | Status: DC
  Administered 2017-06-29 – 2017-07-04 (×6): 20 mg via ORAL
  Filled 2017-06-29 (×7): qty 1

## 2017-06-29 MED ORDER — ACETAMINOPHEN 325 MG PO TABS
650.0000 mg | ORAL_TABLET | Freq: Four times a day (QID) | ORAL | Status: DC | PRN
  Administered 2017-06-29 – 2017-07-04 (×5): 650 mg via ORAL
  Filled 2017-06-29 (×5): qty 2

## 2017-06-29 MED ORDER — FUROSEMIDE 10 MG/ML IJ SOLN
80.0000 mg | Freq: Two times a day (BID) | INTRAMUSCULAR | Status: DC
  Administered 2017-06-29 – 2017-07-01 (×4): 80 mg via INTRAVENOUS
  Filled 2017-06-29 (×6): qty 8

## 2017-06-29 NOTE — Progress Notes (Addendum)
PULMONARY  / CRITICAL CARE MEDICINE  Name: Morgan Odonnell MRN: [id number] DOB: [birth year]    LOS: 2  REFERRING MD :  Dr. Blaine Hamper   CHIEF COMPLAINT:  Encephalopathy and hypercapneic respiratory failure   BRIEF PATIENT DESCRIPTION: 67 yo F with PMH COPD on 2L O2, OSA not on CPAP, OHS, chronic pain on Norco, CKD II, HFpEF, hyporthyroidism, morbid obesity and chronic lymphedema who presented with acute encephalopathy in the setting of acute on chronic hypercapneic respiratory failure likely multifactorial from polypharmacy, HF exacerbation, and non-compliance with CPAP.   LINES / TUBES: 4/18 >  LUE PIV and foley cathter   CULTURES: None   ANTIBIOTICS: None   SIGNIFICANT EVENTS:  4/18 > Placed on BiPAP and admitted to the ICU   INTERVAL HISTORY:  Patient refusing to wear BiPAP overnight.    VITAL SIGNS: Temp:  [98 F (36.7 C)-98.3 F (36.8 C)] 98.1 F (36.7 C) (04/21 0337) Pulse Rate:  [78-99] 78 (04/20 2201) Resp:  [12-18] 18 (04/20 2201) BP: (110-151)/(60-79) 125/60 (04/20 2200) SpO2:  [76 %-97 %] 95 % (04/20 2201) FiO2 (%):  [40 %] 40 % (04/20 2201) Weight:  [329 lb 12.9 oz (149.6 kg)] 329 lb 12.9 oz (149.6 kg) (04/21 0500) HEMODYNAMICS:   VENTILATOR SETTINGS: FiO2 (%):  [40 %] 40 % INTAKE / OUTPUT: Intake/Output      04/20 0701 - 04/21 0700   I.V. (mL/kg) 3 (0)   Total Intake(mL/kg) 3 (0)   Urine (mL/kg/hr) 1574 (0.4)   Total Output 1574   Net -1571         PHYSICAL EXAMINATION: General: elderly female, morbidly obese, comfortable in bed Neuro: alert and oriented, answering questions appropriately, following commands  HEENT: NCAT. OP clear, no stridor  Cardiovascular: distant heart sounds, no mrg appreciated  Lungs: distant, diffusely decreased breath sounds especially at the bases, no crackles or wheezing, no increased work of breathing while on 2L  Abdomen: obese, soft, nontender, normoactive bowel sounds  Musculoskeletal: no deformities, 2+ edema up to  thighs bilaterally  Skin: bilateral lymphedema changes in lower extremities    LABS: Cbc Recent Labs  Lab 06/26/17 2131 06/27/17 0840 06/28/17 0700  WBC 10.7* 18.0* 13.5*  HGB 12.7 12.1 11.4*  HCT 42.9 40.6 38.2  PLT 217 220 192    Chemistry  Recent Labs  Lab 06/27/17 0840  06/27/17 1820 06/28/17 0700 06/28/17 1655  NA  --    < > 139 137 134*  K  --    < > 4.1 3.5 3.3*  CL  --    < > 99* 93* 91*  CO2  --    < > 28 31 31   BUN  --    < > 31* 26* 25*  CREATININE  --    < > 2.21* 1.67* 1.48*  CALCIUM  --    < > 8.6* 8.3* 8.4*  MG 2.4  --   --  2.0  --   PHOS 6.1*  --   --   --   --   GLUCOSE  --    < > 75 83 116*   < > = values in this interval not displayed.    Liver fxn Recent Labs  Lab 06/26/17 2131  AST 26  ALT 18  ALKPHOS 122  BILITOT 0.8  PROT 8.5*  ALBUMIN 3.5   coags No results for input(s): APTT, INR in the last 168 hours. Sepsis markers No results for input(s): LATICACIDVEN, PROCALCITON in the last  168 hours. Cardiac markers Recent Labs  Lab 06/26/17 2123 06/27/17 0214 06/27/17 0840  CKTOTAL 54  --   --   TROPONINI  --  <0.03 <0.03   BNP No results for input(s): PROBNP in the last 168 hours. ABG Recent Labs  Lab 06/27/17 0229 06/27/17 0556 06/27/17 1339  PHART 7.277* 7.297* 7.289*  PCO2ART 75.4* 61.8* 69.6*  PO2ART 136.0* 134.0* 89.0  HCO3 35.2* 30.2* 33.4*  TCO2 37* 32 35*    CBG trend Recent Labs  Lab 06/28/17 1210 06/28/17 1509 06/28/17 1921 06/29/17 0014 06/29/17 0334  GLUCAP 108* 134* 145* 125* 122*    IMAGING: 1. CXR with pulmonary edema  2. Head CT negative for acute processes   ECG:  4/18 NSR, low voltage, nl intervals, QTc 418ms, no signs of acute ischemia  DIAGNOSES: Principal Problem:   Acute on chronic respiratory failure with hypoxia and hypercapnia (HCC) Active Problems:   COPD (chronic obstructive pulmonary disease)Gold C   Hypertension   Chronic diastolic CHF (congestive heart failure) (HCC)    Lymphedema   Morbid obesity with BMI of 50.0-59.9, adult (HCC)   Tobacco abuse   Acute on chronic diastolic CHF (congestive heart failure) (HCC)   Obesity hypoventilation syndrome (HCC)   Hyperkalemia   Acute renal failure superimposed on stage 2 chronic kidney disease (HCC)   Acute metabolic encephalopathy   Acute respiratory failure with hypercapnia (HCC)   ASSESSMENT / PLAN:  PULMONARY A: Acute on chronic hypercapnic respiratory failure - likely multifactorial from polypharmacy, HF exacerbation, and non-compliance with CPAP. Head CT negative  COPD on chronic 2 L nasal cannula Probable OSA, but not documented on her most recent PSG from 2014 Obesity hypoventilation syndrome  P:   - Will need a repeat polysomnogram as an outpatient to reassess for obstructive sleep apnea.   - Will need BiPAP nightly, versus a trilogy device for her obesity hypoventilation syndrome - Continue Brovana and Pulmicort as ordered - Continue DuoNeb, decrease frequency to 4 times daily - Avoid sedating medications if at all possible - Stable for transfer out of ICU   CARDIOVASCULAR A:  Heart failure with preserved ejection fracture - TTE 4/19 >> normal LV size and ejection fraction, consistent with grade 2 diastolic dysfunction, trivial AI moderately dilated left atrium, right ventricle, unable to estimate PAP Hypertension  P:  - Decrease to Lasix 80 BID  - Strict I&O - Hydralazine ordered as needed - Restart metoprolol at half her usual home dose, 12.5 mg twice daily - Home verapamil still on hold  RENAL A:   AKI on CKD II - likely cardiorenal syndrome 2/2 noncompliance with Lasix at home  Hyperkalemia - resolved   P:   - Continue current IV Lasix dosing pending BMP results - Repleting K    GASTROINTESTINAL A:   GERD Elevated ammonia level  P:   - Switch to PO famotidine  - HH diet   HEMATOLOGIC A:   Mild leukocytosis -  No signs/symptoms of infection, did not receive  steroids   P:  - Continue to monitor, CBC this AM pending  - SQ heparin for DVT prophylaxis  INFECTIOUS A:   No active issues   P:   - Following clinically, no evidence of active infection  ENDOCRINE A:   Hypothyroidism CBGs at goal   P:   - Continue home PO Synthroid 200 mcg QD  - Sliding scale insulin as ordered  NEUROLOGIC A:   Acute encephalopathy secondary to acute on chronicn hypercapnic  respiratory failure as above  Chronic pain on chronic opiate therapy - reports on Norco at home last took 4/19 AM, but UDS negative for opiates and positive for benzos which she is not on   P:   - Continue home Celexa - Restart home Lyrica 4/20 at decreased dose, 100 mg twice daily - Avoid sedating medications, avoid baclofen, narcotics   Welford Roche, MD  Internal Medicine PGY-1  P [phone]

## 2017-06-29 NOTE — Progress Notes (Signed)
  BIPAP set up at bedsidec with FFM 10/5 (previous settings) O2 tubing set up for 2 lp bleed in. RN in process of admitting patient to floor. She will place on when finished. Encouraged to call with any issues. No distress noted from patient. Currently on 2 lpm O2 [place].

## 2017-06-29 NOTE — Progress Notes (Signed)
PT Cancellation Note  Patient Details Name: Morgan Odonnell MRN: [id number] DOB: 11/03/1950   Cancelled Treatment:    Reason Eval/Treat Not Completed: Active bedrest order   Will need incr activity orders to proceed with PT evaluation;   Thank you,  Roney Marion, PT  Acute Rehabilitation Services Pager [phone] Office [phone]    [name] 06/29/2017, 7:18 AM

## 2017-06-29 NOTE — Progress Notes (Signed)
Patient alert and oriented x4. Arrived to [place] notified. Admission assessment complete. Purewick in place. No other complaints at this time. Will continue to monitor.

## 2017-06-29 NOTE — Plan of Care (Signed)
Pt a/o, follows commands. Progressing. Needs some PT eval (weak when standing)

## 2017-06-30 LAB — BASIC METABOLIC PANEL
Anion gap: 9 (ref 5–15)
BUN: 17 mg/dL (ref 6–20)
CO2: 36 mmol/L — ABNORMAL HIGH (ref 22–32)
Calcium: 8.8 mg/dL — ABNORMAL LOW (ref 8.9–10.3)
Chloride: 90 mmol/L — ABNORMAL LOW (ref 101–111)
Creatinine, Ser: 1.09 mg/dL — ABNORMAL HIGH (ref 0.44–1.00)
GFR calc Af Amer: >60 mL/min (ref >60)
GFR calc non Af Amer: 52 mL/min — ABNORMAL LOW (ref >60)
Glucose, Bld: 88 mg/dL (ref 65–99)
Potassium: 3 mmol/L — ABNORMAL LOW (ref 3.5–5.1)
Sodium: 135 mmol/L (ref 135–145)

## 2017-06-30 LAB — GLUCOSE, CAPILLARY
Glucose-Capillary: 114 mg/dL — ABNORMAL HIGH (ref 65–99)
Glucose-Capillary: 89 mg/dL (ref 65–99)
Glucose-Capillary: 90 mg/dL (ref 65–99)

## 2017-06-30 MED ORDER — CITALOPRAM HYDROBROMIDE 20 MG PO TABS
20.0000 mg | ORAL_TABLET | Freq: Every day | ORAL | Status: DC
  Administered 2017-06-30 – 2017-07-04 (×5): 20 mg via ORAL
  Filled 2017-06-30 (×5): qty 1

## 2017-06-30 MED ORDER — GI COCKTAIL ~~LOC~~
30.0000 mL | Freq: Once | ORAL | Status: AC
  Administered 2017-06-30: 30 mL via ORAL
  Filled 2017-06-30: qty 30

## 2017-06-30 MED ORDER — POTASSIUM CHLORIDE CRYS ER 20 MEQ PO TBCR
40.0000 meq | EXTENDED_RELEASE_TABLET | ORAL | Status: AC
Start: 2017-06-30 — End: 2017-06-30
  Administered 2017-06-30 (×2): 40 meq via ORAL
  Filled 2017-06-30 (×2): qty 2

## 2017-06-30 NOTE — Care Management Note (Addendum)
Case Management Note  Patient Details  Name: Morgan Odonnell MRN: [id number] Date of Birth: Jul 20, 1950  Subjective/Objective:      Admitted for Acute on chronic respiratory failure with hypoxia and hypercapnia          Action/Plan: NCM received referral for Trilogy consult; Referral for Trilogy evaluation called to [facility] discharge Liaison with [place].   Request sent to Attending Dr. Maylene Roes for: Pulmonary function test; and Blood gas while on Bipap.   In to speak with patient, Discussed diagnosis of CHF.  Patient states she has had for around 3 years, does not cook with salt.  Does not have a scale to weigh herself at home.  States she has used home health services before and is not interested in any home health services because her memory is not good regarding appointments.  PCP noted. Uses [facility].  Patient can pay for medications/food.  Prior to admission patient lived at home with family.  At discharge plans to return to same living situation.  Is able to get to medical appointments. Home DME: walker, oxygen.  Oxygen provided by [facility].  NCM will continue to monitor for discharge transition needs.  Expected Discharge Date: To Be Determined                 Expected Discharge Plan:   Home self care  In-House Referral:   N/A  Discharge planning Services   CM consult  Post Acute Care Choice:    Choice offered to:     DME Arranged:    DME Agency:     HH Arranged:    HH Agency:     Status of Service:     If discussed at [employer] of Stay Meetings, dates discussed:    Additional Comments:  Kristen Cardinal, RN 06/30/2017, 12:35 PM

## 2017-06-30 NOTE — Evaluation (Signed)
Physical Therapy Evaluation Patient Details Name: Morgan Odonnell MRN: [id number] DOB: 09-Oct-1950 Today's Date: 06/30/2017   History of Present Illness  67 yo female with onset of SOB and AMS was admitted for acute hypoxic respiratory failure.  AMS may have been her meds per chart.  PMHx:  COPD, 2L O2 baseline, OSA, CKD2, lymphedema, CHF, hypoventilation syndrome, heart murmur,   Clinical Impression  Pt has been home with husband and noted her low O2 sats with 2L O2 as was her home dose during gait.  Her plan is to follow up with HHPT and have her monitor sats during exertion.  Follow with PT as tolerated, acutely for strength and to progress to stairs when ready.    Follow Up Recommendations Home health PT;Supervision for mobility/OOB    Equipment Recommendations  None recommended by PT    Recommendations for Other Services       Precautions / Restrictions Precautions Precautions: Fall(telemetry) Restrictions Weight Bearing Restrictions: No      Mobility  Bed Mobility Overal bed mobility: Modified Independent             General bed mobility comments: able to get on and off bed with bedrail  Transfers Overall transfer level: Needs assistance Equipment used: Rolling walker (2 wheeled);1 person hand held assist Transfers: Sit to/from Stand Sit to Stand: Min guard;Min assist         General transfer comment: reminders for posture and hand placement  Ambulation/Gait Ambulation/Gait assistance: Min guard Ambulation Distance (Feet): 90 Feet(30 x 3) Assistive device: Rolling walker (2 wheeled);1 person hand held assist Gait Pattern/deviations: Step-to pattern;Step-through pattern;Decreased stride length;Wide base of support;Trunk flexed;Drifts right/left(limited awareness of lines during walker turns) Gait velocity: reduced Gait velocity interpretation: <1.31 ft/sec, indicative of household ambulator General Gait Details: cues needed for confined turns to avoid  twisting lines  Stairs            Wheelchair Mobility    Modified Rankin (Stroke Patients Only)       Balance Overall balance assessment: Needs assistance Sitting-balance support: Feet supported Sitting balance-Leahy Scale: Good     Standing balance support: Bilateral upper extremity supported;During functional activity Standing balance-Leahy Scale: Fair                               Pertinent Vitals/Pain Pain Assessment: No/denies pain    Home Living Family/patient expects to be discharged to:: Private residence Living Arrangements: Spouse/significant other;Children Available Help at Discharge: Family;Available 24 hours/day Type of Home: House Home Access: Stairs to enter Entrance Stairs-Rails: None Entrance Stairs-Number of Steps: 2 Home Layout: One level Home Equipment: Walker - 2 wheels;Cane - single point Additional Comments: walks with no AD most of the time    Prior Function Level of Independence: Independent;Independent with assistive device(s)(depending on her perception of need)               Hand Dominance   Dominant Hand: Right    Extremity/Trunk Assessment   Upper Extremity Assessment Upper Extremity Assessment: Overall WFL for tasks assessed    Lower Extremity Assessment Lower Extremity Assessment: Generalized weakness(adduction hips 4-)    Cervical / Trunk Assessment Cervical / Trunk Assessment: Normal  Communication   Communication: No difficulties  Cognition Arousal/Alertness: Awake/alert Behavior During Therapy: WFL for tasks assessed/performed Overall Cognitive Status: Within Functional Limits for tasks assessed  General Comments      Exercises     Assessment/Plan    PT Assessment Patient needs continued PT services  PT Problem List Decreased strength;Decreased range of motion;Decreased activity tolerance;Decreased balance;Decreased  mobility;Decreased coordination;Decreased knowledge of use of DME;Decreased safety awareness;Cardiopulmonary status limiting activity       PT Treatment Interventions DME instruction;Gait training;Stair training;Functional mobility training;Therapeutic activities;Therapeutic exercise;Balance training;Neuromuscular re-education;Patient/family education    PT Goals (Current goals can be found in the Care Plan section)  Acute Rehab PT Goals Patient Stated Goal: to get home with husband PT Goal Formulation: With patient Time For Goal Achievement: 07/07/17 Potential to Achieve Goals: Good    Frequency Min 2X/week   Barriers to discharge Inaccessible home environment stairs to enter house    Co-evaluation               AM-PAC PT "6 Clicks" Daily Activity  Outcome Measure Difficulty turning over in bed (including adjusting bedclothes, sheets and blankets)?: A Little Difficulty moving from lying on back to sitting on the side of the bed? : A Little Difficulty sitting down on and standing up from a chair with arms (e.g., wheelchair, bedside commode, etc,.)?: A Little Help needed moving to and from a bed to chair (including a wheelchair)?: A Little Help needed walking in hospital room?: A Little Help needed climbing 3-5 steps with a railing? : A Little 6 Click Score: 18    End of Session Equipment Utilized During Treatment: Gait belt;Oxygen Activity Tolerance: Patient tolerated treatment well;Patient limited by fatigue;Treatment limited secondary to medical complications (Comment)(O2 sats were 88% with third walk even on 2L O2) Patient left: in bed;with call bell/phone within reach;with bed alarm set Nurse Communication: Mobility status PT Visit Diagnosis: Unsteadiness on feet (R26.81);Muscle weakness (generalized) (M62.81);Dizziness and giddiness (R42)    Time: 4696-2952 PT Time Calculation (min) (ACUTE ONLY): 26 min   Charges:   PT Evaluation $PT Eval Moderate Complexity: 1  Mod PT Treatments $Gait Training: 8-22 mins   PT G Codes:   PT G-Codes **NOT FOR INPATIENT CLASS** Functional Assessment Tool Used: AM-PAC 6 Clicks Basic Mobility    Ramond Dial 06/30/2017, 12:54 PM   Mee Hives, PT MS Acute Rehab Dept. Number: [place] [facility] [place]

## 2017-06-30 NOTE — Progress Notes (Addendum)
Referral for Trilogy evaluation called to [facility] discharge Liaison with [place].   Request sent to Attending Dr. Maylene Roes for: Pulmonary function test; and Blood gas while on Bipap.

## 2017-06-30 NOTE — Progress Notes (Signed)
PROGRESS NOTE    Morgan Odonnell  DXI:[id number] DOB: May 13, 1950 DOA: 06/26/2017 PCP: Leanna Battles, MD     Brief Narrative:  Morgan Odonnell is a 67 yo female with past medical history significant for chronic respiratory failure on 2 L nasal cannula O2 at baseline, COPD, OSA not on CPAP, OHS, chronic pain, chronic kidney disease stage II, chronic diastolic heart failure, hypothyroidism, morbid obesity, chronic lymphedema who presented to the emergency department with acute encephalopathy in setting of acute on chronic hypercapnic respiratory failure.  It was felt to be multifactorial from polypharmacy, heart failure exacerbation and noncompliance with CPAP leading to hypercapnia.  Patient was admitted and placed on BiPAP.  She has continued to improve and was transferred to [facility] services on 4/22.  Assessment & Plan:   Principal Problem:   Acute on chronic respiratory failure with hypoxia and hypercapnia (HCC) Active Problems:   COPD (chronic obstructive pulmonary disease)Gold C   Hypertension   Chronic diastolic CHF (congestive heart failure) (HCC)   Lymphedema   Morbid obesity with BMI of 50.0-59.9, adult (HCC)   Tobacco abuse   Acute on chronic diastolic CHF (congestive heart failure) (HCC)   Obesity hypoventilation syndrome (HCC)   Hyperkalemia   Acute renal failure superimposed on stage 2 chronic kidney disease (HCC)   Acute metabolic encephalopathy   Acute respiratory failure with hypercapnia (HCC)   Acute on chronic hypoxemic, hypercapnic respiratory failure -Continue nasal cannula 2 L which patient wears at baseline, maintain sat 88-92%  -Continue BiPAP nightly -Eval for trilogy device -PFT and ABG while on BiPAP ordered  Acute on chronic diastolic heart failure -Echocardiogram with grade 2 diastolic dysfunction  -Continue Lasix 80 mg twice daily, transition to PO tomorrow  -Strict I's and O's, daily weight -Metoprolol  COPD -Continue Brovana, Pulmicort,  DuoNeb  Acute metabolic encephalopathy -Resolved -Avoid sedating medications  AKI on CKD stage II -Resolved   Hypothyroidism -Continue Synthroid  Depression -Celexa dose decreased as 20mg /daily is maximum dose  Hypokalemia -Replace, trend    DVT prophylaxis: subq hep Code Status: Full Family Communication: No family at bedside Disposition Plan: Pending improvement, CM to assist with trilogy device on discharge   Consultants:   PCCM   Procedures:   None   Antimicrobials:  Anti-infectives (From admission, onward)   None       Subjective: Patient sitting in chair this morning without acute complaints.  She was able to wear her BiPAP mask overnight, states that it does not fit her very well.  Otherwise denies any shortness of breath or chest pains this morning.  No nausea, vomiting, diarrhea or abdominal pain although her appetite is poor  Objective: Vitals:   06/30/17 0037 06/30/17 0425 06/30/17 0859 06/30/17 1021  BP:  121/66 (!) 153/76   Pulse:  72    Resp:  18    Temp:   98.3 F (36.8 C)   TempSrc:   Oral   SpO2:  94% 95% 96%  Weight: (!) 147.1 kg (324 lb 6.4 oz) (!) 146.9 kg (323 lb 12.8 oz)    Height: 5\' 2"  (1.575 m)       Intake/Output Summary (Last 24 hours) at 06/30/2017 1122 Last data filed at 06/30/2017 0900 Gross per 24 hour  Intake 360 ml  Output 2050 ml  Net -1690 ml   Filed Weights   06/29/17 0500 06/30/17 0037 06/30/17 0425  Weight: (!) 149.6 kg (329 lb 12.9 oz) (!) 147.1 kg (324 lb 6.4 oz) (!) 146.9  kg (323 lb 12.8 oz)    Examination:  General exam: Appears calm and comfortable  Respiratory system: Diminished breath sounds bilaterally. Respiratory effort normal. Cardiovascular system: S1 & S2 heard, RRR. No JVD, murmurs, rubs, gallops or clicks. +pedal edema. Gastrointestinal system: Abdomen is nondistended, soft and nontender. No organomegaly or masses felt. Normal bowel sounds heard. Central nervous system: Alert and oriented. No  focal neurological deficits. Extremities: Symmetric 5 x 5 power. Skin: No rashes, lesions or ulcers Psychiatry: Judgement and insight appear normal. Mood & affect appropriate.   Data Reviewed: I have personally reviewed following labs and imaging studies  CBC: Recent Labs  Lab 06/26/17 2131 06/27/17 0840 06/28/17 0700 06/29/17 0712  WBC 10.7* 18.0* 13.5* 9.9  NEUTROABS 7.1  --   --   --   HGB 12.7 12.1 11.4* 12.6  HCT 42.9 40.6 38.2 41.2  MCV 91.3 90.8 89.5 88.4  PLT 217 220 192 762   Basic Metabolic Panel: Recent Labs  Lab 06/27/17 0840  06/28/17 0700 06/28/17 1655 06/29/17 0507 06/29/17 1617 06/30/17 0550  NA  --    < > 137 134* 136 133* 135  K  --    < > 3.5 3.3* 3.0* 3.3* 3.0*  CL  --    < > 93* 91* 90* 88* 90*  CO2  --    < > 31 31 34* 34* 36*  GLUCOSE  --    < > 83 116* 109* 130* 88  BUN  --    < > 26* 25* 20 17 17   CREATININE  --    < > 1.67* 1.48* 1.18* 1.12* 1.09*  CALCIUM  --    < > 8.3* 8.4* 8.6* 8.8* 8.8*  MG 2.4  --  2.0  --  2.1  --   --   PHOS 6.1*  --   --   --   --   --   --    < > = values in this interval not displayed.   GFR: Estimated Creatinine Clearance: 72.1 mL/min (A) (by C-G formula based on SCr of 1.09 mg/dL (H)). Liver Function Tests: Recent Labs  Lab 06/26/17 2131  AST 26  ALT 18  ALKPHOS 122  BILITOT 0.8  PROT 8.5*  ALBUMIN 3.5   No results for input(s): LIPASE, AMYLASE in the last 168 hours. Recent Labs  Lab 06/26/17 2132 06/28/17 0700  AMMONIA 61* 33   Coagulation Profile: No results for input(s): INR, PROTIME in the last 168 hours. Cardiac Enzymes: Recent Labs  Lab 06/26/17 2123 06/27/17 0214 06/27/17 0840  CKTOTAL 54  --   --   TROPONINI  --  <0.03 <0.03   BNP (last 3 results) No results for input(s): PROBNP in the last 8760 hours. HbA1C: No results for input(s): HGBA1C in the last 72 hours. CBG: Recent Labs  Lab 06/29/17 1539 06/29/17 2002 06/30/17 0034 06/30/17 0424 06/30/17 0824  GLUCAP 136* 133*  114* 90 89   Lipid Profile: No results for input(s): CHOL, HDL, LDLCALC, TRIG, CHOLHDL, LDLDIRECT in the last 72 hours. Thyroid Function Tests: No results for input(s): TSH, T4TOTAL, FREET4, T3FREE, THYROIDAB in the last 72 hours. Anemia Panel: No results for input(s): VITAMINB12, FOLATE, FERRITIN, TIBC, IRON, RETICCTPCT in the last 72 hours. Sepsis Labs: No results for input(s): PROCALCITON, LATICACIDVEN in the last 168 hours.  Recent Results (from the past 240 hour(s))  MRSA PCR Screening     Status: Abnormal   Collection Time: 06/27/17  8:29 AM  Result Value Ref Range Status   MRSA by PCR POSITIVE (A) NEGATIVE Final    Comment:        The GeneXpert MRSA Assay (FDA approved for NASAL specimens only), is one component of a comprehensive MRSA colonization surveillance program. It is not intended to diagnose MRSA infection nor to guide or monitor treatment for MRSA infections. RESULT CALLED TO, READ BACK BY AND VERIFIED WITH: H. D'Adano RN 10:35 06/27/17 ([email or web address]) Performed at [facility], [place] [address], [place], [place] [address]        Radiology Studies: No results found.    Scheduled Meds: . arformoterol  15 mcg Nebulization BID  . aspirin EC  81 mg Oral Daily  . budesonide (PULMICORT) nebulizer solution  0.5 mg Nebulization BID  . citalopram  20 mg Oral Daily  . famotidine  20 mg Oral Daily  . furosemide  80 mg Intravenous BID  . heparin  5,000 Units Subcutaneous Q8H  . ipratropium-albuterol  3 mL Nebulization BID  . levothyroxine  200 mcg Oral QAC breakfast  . metoprolol tartrate  12.5 mg Oral BID  . potassium chloride  40 mEq Oral Q4H  . pregabalin  100 mg Oral BID  . sodium chloride flush  3 mL Intravenous Q12H   Continuous Infusions: . sodium chloride    . sodium chloride       LOS: 3 days    Time spent: 40 minutes   Dessa Phi, DO [facility] [email or web address] Password [facility] 06/30/2017, 11:22 AM

## 2017-06-30 NOTE — Progress Notes (Signed)
Patient placed on BiPAP for the night and tolerating well at this time. RT will continue to monitor.

## 2017-07-01 ENCOUNTER — Inpatient Hospital Stay (HOSPITAL_COMMUNITY): Payer: Medicare Other

## 2017-07-01 LAB — PULMONARY FUNCTION TEST
DL/VA % pred: 111 %
DL/VA: 5.04 ml/min/mmHg/L
DLCO cor % pred: 53 %
DLCO cor: 11.54 ml/min/mmHg
DLCO unc % pred: 52 %
DLCO unc: 11.25 ml/min/mmHg
FEF 25-75 Pre: 0.7 L/sec
FEF2575-%Pred-Pre: 34 %
FEV1-%Pred-Pre: 35 %
FEV1-Pre: 0.79 L
FEV1FVC-%Pred-Pre: 105 %
FEV6-%Pred-Pre: 34 %
FEV6-Pre: 0.98 L
FEV6FVC-%Pred-Pre: 104 %
FVC-%Pred-Pre: 33 %
FVC-Pre: 0.98 L
Pre FEV1/FVC ratio: 81 %
Pre FEV6/FVC Ratio: 100 %

## 2017-07-01 LAB — BASIC METABOLIC PANEL
Anion gap: 11 (ref 5–15)
BUN: 15 mg/dL (ref 6–20)
CO2: 31 mmol/L (ref 22–32)
Calcium: 8.7 mg/dL — ABNORMAL LOW (ref 8.9–10.3)
Chloride: 92 mmol/L — ABNORMAL LOW (ref 101–111)
Creatinine, Ser: 0.96 mg/dL (ref 0.44–1.00)
GFR calc Af Amer: >60 mL/min (ref >60)
GFR calc non Af Amer: >60 mL/min (ref >60)
Glucose, Bld: 98 mg/dL (ref 65–99)
Potassium: 3.8 mmol/L (ref 3.5–5.1)
Sodium: 134 mmol/L — ABNORMAL LOW (ref 135–145)

## 2017-07-01 LAB — BLOOD GAS, ARTERIAL
Acid-Base Excess: 11.1 mmol/L — ABNORMAL HIGH (ref 0.0–2.0)
Bicarbonate: 36.3 mmol/L — ABNORMAL HIGH (ref 20.0–28.0)
Drawn by: 406621
O2 Content: 3 L/min
O2 Saturation: 97 %
Patient temperature: 98.6
pCO2 arterial: 59.9 mmHg — ABNORMAL HIGH (ref 32.0–48.0)
pH, Arterial: 7.4 (ref 7.350–7.450)
pO2, Arterial: 89.6 mmHg (ref 83.0–108.0)

## 2017-07-01 LAB — MAGNESIUM: Magnesium: 2 mg/dL (ref 1.7–2.4)

## 2017-07-01 MED ORDER — FUROSEMIDE 80 MG PO TABS
80.0000 mg | ORAL_TABLET | Freq: Two times a day (BID) | ORAL | Status: DC
  Administered 2017-07-01 – 2017-07-04 (×7): 80 mg via ORAL
  Filled 2017-07-01 (×7): qty 1

## 2017-07-01 NOTE — Progress Notes (Signed)
PROGRESS NOTE    Morgan Odonnell  XQJ:[id number] DOB: September 23, 1950 DOA: 06/26/2017 PCP: Leanna Battles, MD     Brief Narrative:  Morgan Odonnell is a 67 yo female with past medical history significant for chronic respiratory failure on 2 L nasal cannula O2 at baseline, COPD, OSA not on CPAP, OHS, chronic pain, chronic kidney disease stage II, chronic diastolic heart failure, hypothyroidism, morbid obesity, chronic lymphedema who presented to the emergency department with acute encephalopathy in setting of acute on chronic hypercapnic respiratory failure.  It was felt to be multifactorial from polypharmacy, heart failure exacerbation and noncompliance with CPAP leading to hypercapnia.  Patient was admitted and placed on BiPAP.  She has continued to improve and was transferred to [facility] services on 4/22.  Assessment & Plan:   Principal Problem:   Acute on chronic respiratory failure with hypoxia and hypercapnia (HCC) Active Problems:   COPD (chronic obstructive pulmonary disease)Gold C   Hypertension   Chronic diastolic CHF (congestive heart failure) (HCC)   Lymphedema   Morbid obesity with BMI of 50.0-59.9, adult (HCC)   Tobacco abuse   Acute on chronic diastolic CHF (congestive heart failure) (HCC)   Obesity hypoventilation syndrome (HCC)   Hyperkalemia   Acute renal failure superimposed on stage 2 chronic kidney disease (HCC)   Acute metabolic encephalopathy   Acute respiratory failure with hypercapnia (HCC)   Acute on chronic hypoxemic, hypercapnic respiratory failure -Continue nasal cannula 2 L which patient wears at baseline, maintain sat 88-92%  -Continue BiPAP nightly -Eval for trilogy device -PFT and ABG while on BiPAP ordered   Acute on chronic diastolic heart failure -Echocardiogram with grade 2 diastolic dysfunction  -Continue Lasix 80 mg twice daily, transition to PO today  -Strict I's and O's, daily weight -Metoprolol  COPD -Continue Brovana, Pulmicort,  DuoNeb  Acute metabolic encephalopathy -Resolved -Avoid sedating medications  AKI on CKD stage II -Resolved   Hypothyroidism -Continue Synthroid  Depression -Celexa dose decreased as 20mg /daily is maximum dose   DVT prophylaxis: subq hep Code Status: Full Family Communication: No family at bedside Disposition Plan: CM to assist with trilogy device on discharge   Consultants:   PCCM   Procedures:   None   Antimicrobials:  Anti-infectives (From admission, onward)   None       Subjective: No new complaints this morning.  Breathing is stable.  Wants to go home  Objective: Vitals:   07/01/17 0503 07/01/17 0507 07/01/17 0816 07/01/17 1031  BP: (!) 122/49   (!) 146/82  Pulse: 73   83  Resp:    20  Temp: 98.8 F (37.1 C)   98.8 F (37.1 C)  TempSrc: Oral   Oral  SpO2: 98%  95% 100%  Weight:  (!) 148.8 kg (328 lb)    Height:        Intake/Output Summary (Last 24 hours) at 07/01/2017 1257 Last data filed at 07/01/2017 1135 Gross per 24 hour  Intake 600 ml  Output 1825 ml  Net -1225 ml   Filed Weights   06/30/17 0037 06/30/17 0425 07/01/17 0507  Weight: (!) 147.1 kg (324 lb 6.4 oz) (!) 146.9 kg (323 lb 12.8 oz) (!) 148.8 kg (328 lb)    Examination: General exam: Appears calm and comfortable  Respiratory system: Clear to auscultation. Respiratory effort normal. On [place] O2  Cardiovascular system: S1 & S2 heard, RRR. No JVD, murmurs, rubs, gallops or clicks. +pedal edema. Gastrointestinal system: Abdomen is nondistended, soft and nontender. No organomegaly  or masses felt. Normal bowel sounds heard. Central nervous system: Alert and oriented. No focal neurological deficits. Extremities: Symmetric 5 x 5 power. Skin: No rashes, lesions or ulcers Psychiatry: Judgement and insight appear normal. Mood & affect appropriate.    Data Reviewed: I have personally reviewed following labs and imaging studies  CBC: Recent Labs  Lab 06/26/17 2131 06/27/17 0840  06/28/17 0700 06/29/17 0712  WBC 10.7* 18.0* 13.5* 9.9  NEUTROABS 7.1  --   --   --   HGB 12.7 12.1 11.4* 12.6  HCT 42.9 40.6 38.2 41.2  MCV 91.3 90.8 89.5 88.4  PLT 217 220 192 237   Basic Metabolic Panel: Recent Labs  Lab 06/27/17 0840  06/28/17 0700 06/28/17 1655 06/29/17 0507 06/29/17 1617 06/30/17 0550 07/01/17 0607  NA  --    < > 137 134* 136 133* 135 134*  K  --    < > 3.5 3.3* 3.0* 3.3* 3.0* 3.8  CL  --    < > 93* 91* 90* 88* 90* 92*  CO2  --    < > 31 31 34* 34* 36* 31  GLUCOSE  --    < > 83 116* 109* 130* 88 98  BUN  --    < > 26* 25* 20 17 17 15   CREATININE  --    < > 1.67* 1.48* 1.18* 1.12* 1.09* 0.96  CALCIUM  --    < > 8.3* 8.4* 8.6* 8.8* 8.8* 8.7*  MG 2.4  --  2.0  --  2.1  --   --  2.0  PHOS 6.1*  --   --   --   --   --   --   --    < > = values in this interval not displayed.   GFR: Estimated Creatinine Clearance: 82.6 mL/min (by C-G formula based on SCr of 0.96 mg/dL). Liver Function Tests: Recent Labs  Lab 06/26/17 2131  AST 26  ALT 18  ALKPHOS 122  BILITOT 0.8  PROT 8.5*  ALBUMIN 3.5   No results for input(s): LIPASE, AMYLASE in the last 168 hours. Recent Labs  Lab 06/26/17 2132 06/28/17 0700  AMMONIA 61* 33   Coagulation Profile: No results for input(s): INR, PROTIME in the last 168 hours. Cardiac Enzymes: Recent Labs  Lab 06/26/17 2123 06/27/17 0214 06/27/17 0840  CKTOTAL 54  --   --   TROPONINI  --  <0.03 <0.03   BNP (last 3 results) No results for input(s): PROBNP in the last 8760 hours. HbA1C: No results for input(s): HGBA1C in the last 72 hours. CBG: Recent Labs  Lab 06/29/17 1539 06/29/17 2002 06/30/17 0034 06/30/17 0424 06/30/17 0824  GLUCAP 136* 133* 114* 90 89   Lipid Profile: No results for input(s): CHOL, HDL, LDLCALC, TRIG, CHOLHDL, LDLDIRECT in the last 72 hours. Thyroid Function Tests: No results for input(s): TSH, T4TOTAL, FREET4, T3FREE, THYROIDAB in the last 72 hours. Anemia Panel: No results for  input(s): VITAMINB12, FOLATE, FERRITIN, TIBC, IRON, RETICCTPCT in the last 72 hours. Sepsis Labs: No results for input(s): PROCALCITON, LATICACIDVEN in the last 168 hours.  Recent Results (from the past 240 hour(s))  MRSA PCR Screening     Status: Abnormal   Collection Time: 06/27/17  8:29 AM  Result Value Ref Range Status   MRSA by PCR POSITIVE (A) NEGATIVE Final    Comment:        The GeneXpert MRSA Assay (FDA approved for NASAL specimens only), is one component  of a comprehensive MRSA colonization surveillance program. It is not intended to diagnose MRSA infection nor to guide or monitor treatment for MRSA infections. RESULT CALLED TO, READ BACK BY AND VERIFIED WITH: H. D'Adano RN 10:35 06/27/17 ([email or web address]) Performed at [facility], [place] [address] [place], [place] [address]        Radiology Studies: No results found.    Scheduled Meds: . arformoterol  15 mcg Nebulization BID  . aspirin EC  81 mg Oral Daily  . budesonide (PULMICORT) nebulizer solution  0.5 mg Nebulization BID  . citalopram  20 mg Oral Daily  . famotidine  20 mg Oral Daily  . furosemide  80 mg Intravenous BID  . heparin  5,000 Units Subcutaneous Q8H  . ipratropium-albuterol  3 mL Nebulization BID  . levothyroxine  200 mcg Oral QAC breakfast  . metoprolol tartrate  12.5 mg Oral BID  . pregabalin  100 mg Oral BID  . sodium chloride flush  3 mL Intravenous Q12H   Continuous Infusions: . sodium chloride    . sodium chloride       LOS: 4 days    Time spent: 40 minutes   Dessa Phi, DO [facility] [email or web address] Password [email or web address] 07/01/2017, 12:57 PM

## 2017-07-01 NOTE — Progress Notes (Signed)
RT unable to draw ABG.  Attempted unsuccessfully x2 myself and x1 by Liliana Cline.  Patient refused further attempts.

## 2017-07-01 NOTE — Progress Notes (Signed)
CM talked to [facility] with [place]; per insurance requirements for obtaining the Trilogy, patient needs an ABG and PFT results documented; message sent to Attending MD; Aneta Mins [phone]

## 2017-07-01 NOTE — Consult Note (Signed)
   [facility] Inpatient Consult   07/01/2017  Morgan Odonnell 09/21/1950 [id number]  Patient screened for potential [facility] services. Patient is in the [facility] of the [place] [facility] services under patient's [employer] plan. Met with the patient at the bedside.  Patient reports that she has 'decent' transportation with friends and family getting her to appointments, she is working with the [facility] to getting her Meals on Wheels, she is on their waiting list.  She denies any issues with getting medications and she thinks she will have medication changes.  She denies any needs as she uses [place] for home health in the past and for her DME. No current needs assessed for transition to home. Patient did accept a brochure, 24 hour nurse advise line magnet with contact information.  Patient was admitted with COPD exacerbation.   For questions contact:   Natividad Brood, RN BSN [facility] Liaison  [phone] business mobile phone Toll free office [phone]

## 2017-07-01 NOTE — Progress Notes (Signed)
CM talked to [facility] with [place], he is awaiting on approval from [facility] for the Trilogy home vent; Aneta Mins [phone]

## 2017-07-01 NOTE — Progress Notes (Addendum)
Patient continues to exhibit signs of hypercapnia associated with chronic respiratory failure secondary to severe COPD.  Patient requires the use of NIV both nightly and daytime to help with exacerbation.  The use of NIV will treat patient's high PCO2 level and can reduce the risk of exacerbations and future hospitalizations when used at night and during the day.  Patient will need these advanced settings in conjunction with her current medication regimen; BiPAP is not an option due to its functional limitations and the severity of the patient's condition.  Failure to have NIV available for use over 24 hours could lead to death.  Morgan Phi, DO [facility] [email or web address] Password [facility] 07/01/2017, 12:41 PM

## 2017-07-01 NOTE — Progress Notes (Signed)
NCM advised Attending Dr. Maylene Roes that patient refused blood gas lab. Pulmonary function test was not completed needs the following when ordered: location [facility]) & type of PFT.  Montel Culver, BSN, RN  Nurse case Freight forwarder

## 2017-07-02 LAB — BASIC METABOLIC PANEL
Anion gap: 11 (ref 5–15)
BUN: 13 mg/dL (ref 6–20)
CO2: 34 mmol/L — ABNORMAL HIGH (ref 22–32)
Calcium: 8.5 mg/dL — ABNORMAL LOW (ref 8.9–10.3)
Chloride: 88 mmol/L — ABNORMAL LOW (ref 101–111)
Creatinine, Ser: 0.96 mg/dL (ref 0.44–1.00)
GFR calc Af Amer: >60 mL/min (ref >60)
GFR calc non Af Amer: >60 mL/min (ref >60)
Glucose, Bld: 93 mg/dL (ref 65–99)
Potassium: 3.1 mmol/L — ABNORMAL LOW (ref 3.5–5.1)
Sodium: 133 mmol/L — ABNORMAL LOW (ref 135–145)

## 2017-07-02 MED ORDER — POTASSIUM CHLORIDE CRYS ER 20 MEQ PO TBCR
40.0000 meq | EXTENDED_RELEASE_TABLET | ORAL | Status: AC
  Administered 2017-07-02 (×2): 40 meq via ORAL
  Filled 2017-07-02 (×2): qty 2

## 2017-07-02 NOTE — Progress Notes (Addendum)
Physical Therapy Treatment Patient Details Name: MANEH SIEBEN MRN: [id number] DOB: 04-May-1950 Today's Date: 07/02/2017    History of Present Illness 67 yo female with onset of SOB and AMS was admitted for acute hypoxic respiratory failure.  AMS may have been her meds per chart.  PMHx:  COPD, 2L O2 baseline, OSA, CKD2, lymphedema, CHF, hypoventilation syndrome, heart murmur,     PT Comments    Patient maintaining level of functional mobility. Ambulating 90 feet (30 x 3) with prolonged seated rest breaks. O2 saturations with decrease to 84% on 2L [place] with ambulation; able to increase > 90% once seated with instructions for pursed lip breathing. Discussed activity pacing and recommendations. Continue to progress endurance and functional strength training as tolerated.    Follow Up Recommendations  Home health PT;Supervision for mobility/OOB     Equipment Recommendations  None recommended by PT    Recommendations for Other Services       Precautions / Restrictions Precautions Precautions: Fall(telemetry) Restrictions Weight Bearing Restrictions: No    Mobility  Bed Mobility               General bed mobility comments: Sitting EOB  Transfers Overall transfer level: Needs assistance Equipment used: Rolling walker (2 wheeled);1 person hand held assist Transfers: Sit to/from Stand Sit to Stand: Supervision         General transfer comment: Frequent reminders for hand placement  Ambulation/Gait Ambulation/Gait assistance: Supervision;Min guard Ambulation Distance (Feet): 90 Feet Assistive device: Rolling walker (2 wheeled);1 person hand held assist Gait Pattern/deviations: Step-through pattern;Decreased stride length;Wide base of support Gait velocity: reduced Gait velocity interpretation: <1.31 ft/sec, indicative of household ambulator General Gait Details: Patient with increased steadiness using RW versus no AD. Ambulated 90 feet (30 feet x 3) with prolonged  seated rest beraks.   Stairs             Wheelchair Mobility    Modified Rankin (Stroke Patients Only)       Balance Overall balance assessment: Needs assistance Sitting-balance support: Feet supported Sitting balance-Leahy Scale: Good     Standing balance support: During functional activity;No upper extremity supported Standing balance-Leahy Scale: Fair                              Cognition Arousal/Alertness: Awake/alert Behavior During Therapy: WFL for tasks assessed/performed Overall Cognitive Status: Within Functional Limits for tasks assessed                                        Exercises Other Exercises Other Exercises: Seated bilateral LAQ's x 20    General Comments        Pertinent Vitals/Pain Pain Assessment: No/denies pain    Home Living                      Prior Function            PT Goals (current goals can now be found in the care plan section) Acute Rehab PT Goals Patient Stated Goal: to get home with husband PT Goal Formulation: With patient Time For Goal Achievement: 07/07/17 Potential to Achieve Goals: Good Progress towards PT goals: Progressing toward goals    Frequency    Min 2X/week      PT Plan Current plan remains appropriate    Co-evaluation  AM-PAC PT "6 Clicks" Daily Activity  Outcome Measure  Difficulty turning over in bed (including adjusting bedclothes, sheets and blankets)?: A Little Difficulty moving from lying on back to sitting on the side of the bed? : A Little Difficulty sitting down on and standing up from a chair with arms (e.g., wheelchair, bedside commode, etc,.)?: A Little Help needed moving to and from a bed to chair (including a wheelchair)?: A Little Help needed walking in hospital room?: A Little Help needed climbing 3-5 steps with a railing? : A Little 6 Click Score: 18    End of Session Equipment Utilized During Treatment: Gait  belt;Oxygen Activity Tolerance: Patient limited by fatigue;Treatment limited secondary to medical complications (Comment) Patient left: in bed;with call bell/phone within reach;with bed alarm set Nurse Communication: Mobility status PT Visit Diagnosis: Unsteadiness on feet (R26.81);Muscle weakness (generalized) (M62.81);Dizziness and giddiness (R42)     Time: 4166-0630 PT Time Calculation (min) (ACUTE ONLY): 38 min  Charges:  $Gait Training: 8-22 mins $Therapeutic Activity: 23-37 mins                    G Codes:       Ellamae Sia, PT, DPT Acute Rehabilitation Services  Pager: [phone]    Willy Eddy 07/02/2017, 10:26 AM

## 2017-07-02 NOTE — Progress Notes (Addendum)
CM talked to [facility] with [place], still awaiting on approval for Trilogy/ home vent from [facility]; Aneta Mins [phone]  3:32 pm - [facility] called [phone] to have the authorization expedited. ID # [id number]; reference # [id number]; note for medical necessity faxed as requested; fax # [phone]; Aneta Mins [phone];

## 2017-07-02 NOTE — Progress Notes (Signed)
PROGRESS NOTE    Morgan Odonnell  [id number]:[id number] DOB: 04/06/1950 DOA: 06/26/2017 PCP: Leanna Battles, MD     Brief Narrative:  Morgan Odonnell is a 67 yo female with past medical history significant for chronic respiratory failure on 2 L nasal cannula O2 at baseline, COPD, OSA not on CPAP, OHS, chronic pain, chronic kidney disease stage II, chronic diastolic heart failure, hypothyroidism, morbid obesity, chronic lymphedema who presented to the emergency department with acute encephalopathy in setting of acute on chronic hypercapnic respiratory failure.  It was felt to be multifactorial from polypharmacy, heart failure exacerbation and noncompliance with CPAP leading to hypercapnia.  Patient was admitted and placed on BiPAP.  She has continued to improve and was transferred to [facility] services on 4/22.  Assessment & Plan:   Principal Problem:   Acute on chronic respiratory failure with hypoxia and hypercapnia (HCC) Active Problems:   COPD (chronic obstructive pulmonary disease)Gold C   Hypertension   Chronic diastolic CHF (congestive heart failure) (HCC)   Lymphedema   Morbid obesity with BMI of 50.0-59.9, adult (HCC)   Tobacco abuse   Acute on chronic diastolic CHF (congestive heart failure) (HCC)   Obesity hypoventilation syndrome (HCC)   Hyperkalemia   Acute renal failure superimposed on stage 2 chronic kidney disease (HCC)   Acute metabolic encephalopathy   Acute respiratory failure with hypercapnia (HCC)   Acute on chronic hypoxemic, hypercapnic respiratory failure -Continue nasal cannula 2 L which patient wears at baseline, maintain sat 88-92%  -Continue BiPAP nightly -Eval for trilogy device. PFT and ABG completed and awaiting insurance authorization   Acute on chronic diastolic heart failure -Echocardiogram with grade 2 diastolic dysfunction  -Continue Lasix 80 mg twice daily PO   -Strict I's and O's, daily weight -Metoprolol  COPD -Continue Brovana, Pulmicort,  DuoNeb  Acute metabolic encephalopathy -Resolved -Avoid sedating medications  AKI on CKD stage II -Resolved   Hypothyroidism -Continue Synthroid  Depression -Celexa dose decreased as 20mg /daily is maximum dose   DVT prophylaxis: subq hep Code Status: Full Family Communication: No family at bedside Disposition Plan: CM to assist with trilogy device on discharge   Consultants:   PCCM   Procedures:   None   Antimicrobials:  Anti-infectives (From admission, onward)   None       Subjective: No complaints, breathing stable and no chest pain. Wants to go home.   Objective: Vitals:   07/02/17 0521 07/02/17 0533 07/02/17 0734 07/02/17 0854  BP: (!) 119/57   (!) 146/67  Pulse: 80   81  Resp:      Temp: 98.5 F (36.9 C)     TempSrc: Oral     SpO2: 95%  95%   Weight:  (!) 144 kg (317 lb 6.4 oz)    Height:        Intake/Output Summary (Last 24 hours) at 07/02/2017 1425 Last data filed at 07/02/2017 1304 Gross per 24 hour  Intake 720 ml  Output 1500 ml  Net -780 ml   Filed Weights   06/30/17 0425 07/01/17 0507 07/02/17 0533  Weight: (!) 146.9 kg (323 lb 12.8 oz) (!) 148.8 kg (328 lb) (!) 144 kg (317 lb 6.4 oz)    Examination: General exam: Appears calm and comfortable  Respiratory system: Diminished breath sounds bilaterally. Respiratory effort normal. On [place] O2 Cardiovascular system: S1 & S2 heard, RRR. No JVD, murmurs, rubs, gallops or clicks. +Trace pedal edema. Gastrointestinal system: Abdomen is nondistended, soft and nontender. No organomegaly or masses  felt. Normal bowel sounds heard. Central nervous system: Alert and oriented. No focal neurological deficits. Extremities: Symmetric 5 x 5 power. Skin: +Chronic venous stasis changes of bilateral LEs Psychiatry: Judgement and insight appear normal. Mood & affect appropriate.     Data Reviewed: I have personally reviewed following labs and imaging studies  CBC: Recent Labs  Lab 06/26/17 2131  06/27/17 0840 06/28/17 0700 06/29/17 0712  WBC 10.7* 18.0* 13.5* 9.9  NEUTROABS 7.1  --   --   --   HGB 12.7 12.1 11.4* 12.6  HCT 42.9 40.6 38.2 41.2  MCV 91.3 90.8 89.5 88.4  PLT 217 220 192 194   Basic Metabolic Panel: Recent Labs  Lab 06/27/17 0840  06/28/17 0700  06/29/17 0507 06/29/17 1617 06/30/17 0550 07/01/17 0607 07/02/17 0457  NA  --    < > 137   < > 136 133* 135 134* 133*  K  --    < > 3.5   < > 3.0* 3.3* 3.0* 3.8 3.1*  CL  --    < > 93*   < > 90* 88* 90* 92* 88*  CO2  --    < > 31   < > 34* 34* 36* 31 34*  GLUCOSE  --    < > 83   < > 109* 130* 88 98 93  BUN  --    < > 26*   < > 20 17 17 15 13   CREATININE  --    < > 1.67*   < > 1.18* 1.12* 1.09* 0.96 0.96  CALCIUM  --    < > 8.3*   < > 8.6* 8.8* 8.8* 8.7* 8.5*  MG 2.4  --  2.0  --  2.1  --   --  2.0  --   PHOS 6.1*  --   --   --   --   --   --   --   --    < > = values in this interval not displayed.   GFR: Estimated Creatinine Clearance: 80.9 mL/min (by C-G formula based on SCr of 0.96 mg/dL). Liver Function Tests: Recent Labs  Lab 06/26/17 2131  AST 26  ALT 18  ALKPHOS 122  BILITOT 0.8  PROT 8.5*  ALBUMIN 3.5   No results for input(s): LIPASE, AMYLASE in the last 168 hours. Recent Labs  Lab 06/26/17 2132 06/28/17 0700  AMMONIA 61* 33   Coagulation Profile: No results for input(s): INR, PROTIME in the last 168 hours. Cardiac Enzymes: Recent Labs  Lab 06/26/17 2123 06/27/17 0214 06/27/17 0840  CKTOTAL 54  --   --   TROPONINI  --  <0.03 <0.03   BNP (last 3 results) No results for input(s): PROBNP in the last 8760 hours. HbA1C: No results for input(s): HGBA1C in the last 72 hours. CBG: Recent Labs  Lab 06/29/17 1539 06/29/17 2002 06/30/17 0034 06/30/17 0424 06/30/17 0824  GLUCAP 136* 133* 114* 90 89   Lipid Profile: No results for input(s): CHOL, HDL, LDLCALC, TRIG, CHOLHDL, LDLDIRECT in the last 72 hours. Thyroid Function Tests: No results for input(s): TSH, T4TOTAL, FREET4,  T3FREE, THYROIDAB in the last 72 hours. Anemia Panel: No results for input(s): VITAMINB12, FOLATE, FERRITIN, TIBC, IRON, RETICCTPCT in the last 72 hours. Sepsis Labs: No results for input(s): PROCALCITON, LATICACIDVEN in the last 168 hours.  Recent Results (from the past 240 hour(s))  MRSA PCR Screening     Status: Abnormal   Collection Time: 06/27/17  8:29  AM  Result Value Ref Range Status   MRSA by PCR POSITIVE (A) NEGATIVE Final    Comment:        The GeneXpert MRSA Assay (FDA approved for NASAL specimens only), is one component of a comprehensive MRSA colonization surveillance program. It is not intended to diagnose MRSA infection nor to guide or monitor treatment for MRSA infections. RESULT CALLED TO, READ BACK BY AND VERIFIED WITH: H. D'Adano RN 10:35 06/27/17 ([email or web address]) Performed at [facility], [place] [address], [place], [place] [address]        Radiology Studies: No results found.    Scheduled Meds: . arformoterol  15 mcg Nebulization BID  . aspirin EC  81 mg Oral Daily  . budesonide (PULMICORT) nebulizer solution  0.5 mg Nebulization BID  . citalopram  20 mg Oral Daily  . famotidine  20 mg Oral Daily  . furosemide  80 mg Oral BID  . heparin  5,000 Units Subcutaneous Q8H  . ipratropium-albuterol  3 mL Nebulization BID  . levothyroxine  200 mcg Oral QAC breakfast  . metoprolol tartrate  12.5 mg Oral BID  . pregabalin  100 mg Oral BID  . sodium chloride flush  3 mL Intravenous Q12H   Continuous Infusions: . sodium chloride    . sodium chloride       LOS: 5 days    Time spent: 25 minutes   Dessa Phi, DO [facility] [email or web address] Password [email or web address] 07/02/2017, 2:25 PM

## 2017-07-03 LAB — BASIC METABOLIC PANEL
Anion gap: 12 (ref 5–15)
BUN: 14 mg/dL (ref 6–20)
CO2: 30 mmol/L (ref 22–32)
Calcium: 8.5 mg/dL — ABNORMAL LOW (ref 8.9–10.3)
Chloride: 93 mmol/L — ABNORMAL LOW (ref 101–111)
Creatinine, Ser: 0.95 mg/dL (ref 0.44–1.00)
GFR calc Af Amer: >60 mL/min (ref >60)
GFR calc non Af Amer: >60 mL/min (ref >60)
Glucose, Bld: 96 mg/dL (ref 65–99)
Potassium: 4.8 mmol/L (ref 3.5–5.1)
Sodium: 135 mmol/L (ref 135–145)

## 2017-07-03 NOTE — Progress Notes (Signed)
Physical Therapy Treatment Patient Details Name: Morgan Odonnell MRN: [id number] DOB: [birth year] Today's Date: 07/03/2017    History of Present Illness 67 yo female with onset of SOB and AMS was admitted for acute hypoxic respiratory failure.  AMS may have been her meds per chart.  PMHx:  COPD, 2L O2 baseline, OSA, CKD2, lymphedema, CHF, hypoventilation syndrome, heart murmur,     PT Comments    Patient expressing frustration and stated she ws "depressed," because she is not home yet. Agreeable to therapy in room. Maintaining progress towards goals. Session focused on lower extremity strengthening, functional strength training with sit to stands, and ambulation using RW. Patient requiring supervision. Patient could benefit from trialing Rollator to allow for increased mobility and ease of taking seated rest breaks when needed to check oxygen saturation.     Follow Up Recommendations  Home health PT;Supervision for mobility/OOB     Equipment Recommendations  None recommended by PT    Recommendations for Other Services       Precautions / Restrictions Precautions Precautions: Fall(telemetry) Restrictions Weight Bearing Restrictions: No    Mobility  Bed Mobility Overal bed mobility: Modified Independent             General bed mobility comments: Supine to sitting with increased time  Transfers Overall transfer level: Needs assistance Equipment used: Rolling walker (2 wheeled);None Transfers: Sit to/from Stand Sit to Stand: Supervision            Ambulation/Gait Ambulation/Gait assistance: Supervision Ambulation Distance (Feet): 20 Feet Assistive device: Rolling walker (2 wheeled);1 person hand held assist Gait Pattern/deviations: Step-through pattern;Decreased stride length;Wide base of support Gait velocity: reduced   General Gait Details: Ambulation in room to and from bathroom. Cues needed for confined turns   Environmental education officer Rankin (Stroke Patients Only)       Balance Overall balance assessment: Needs assistance Sitting-balance support: Feet supported Sitting balance-Leahy Scale: Good     Standing balance support: During functional activity;No upper extremity supported Standing balance-Leahy Scale: Fair                              Cognition Arousal/Alertness: Awake/alert Behavior During Therapy: WFL for tasks assessed/performed Overall Cognitive Status: Within Functional Limits for tasks assessed                                        Exercises Other Exercises Other Exercises: Seated bilateral LAQ's x 40 Other Exercises: 5 sit to stands with unilateral UE push off for functional strength training    General Comments        Pertinent Vitals/Pain Pain Assessment: No/denies pain    Home Living                      Prior Function            PT Goals (current goals can now be found in the care plan section) Acute Rehab PT Goals Patient Stated Goal: to get home with husband PT Goal Formulation: With patient Time For Goal Achievement: 07/07/17 Potential to Achieve Goals: Good Progress towards PT goals: Progressing toward goals    Frequency    Min 2X/week      PT Plan Current plan remains appropriate  Co-evaluation              AM-PAC PT "6 Clicks" Daily Activity  Outcome Measure  Difficulty turning over in bed (including adjusting bedclothes, sheets and blankets)?: A Little Difficulty moving from lying on back to sitting on the side of the bed? : A Little Difficulty sitting down on and standing up from a chair with arms (e.g., wheelchair, bedside commode, etc,.)?: A Little Help needed moving to and from a bed to chair (including a wheelchair)?: A Little Help needed walking in hospital room?: A Little Help needed climbing 3-5 steps with a railing? : A Little 6 Click Score: 18    End of Session Equipment  Utilized During Treatment: Gait belt;Oxygen Activity Tolerance: Patient limited by fatigue Patient left: in bed;with call bell/phone within reach;with bed alarm set Nurse Communication: Mobility status PT Visit Diagnosis: Unsteadiness on feet (R26.81);Muscle weakness (generalized) (M62.81);Dizziness and giddiness (R42)     Time: 1531-1550 PT Time Calculation (min) (ACUTE ONLY): 19 min  Charges:  $Therapeutic Activity: 8-22 mins                    G Codes:      Ellamae Sia, PT, DPT Acute Rehabilitation Services  Pager: [phone]  Willy Eddy 07/03/2017, 3:56 PM

## 2017-07-03 NOTE — Progress Notes (Signed)
Placed patient on BIPAP for the night with IPAP set at 10cm and EPAP set at 5cm.Oxygen set at 3lpm with Sp02=95%

## 2017-07-03 NOTE — Progress Notes (Signed)
Spoke with [facility] to expedite request for Trilogy home vent as this is our discharge barrier. Request for expedite request accepted, new reference [id number]. CM updated.   Dessa Phi, DO [facility] [email or web address] Password [email or web address] 07/03/2017, 1:52 PM

## 2017-07-03 NOTE — Progress Notes (Signed)
PROGRESS NOTE    Morgan Odonnell  OAC:[id number] DOB: 09/25/1950 DOA: 06/26/2017 PCP: Leanna Battles, MD     Brief Narrative:  Morgan Odonnell is a 67 yo female with past medical history significant for chronic respiratory failure on 2 L nasal cannula O2 at baseline, COPD, OSA not on CPAP, OHS, chronic pain, chronic kidney disease stage II, chronic diastolic heart failure, hypothyroidism, morbid obesity, chronic lymphedema who presented to the emergency department with acute encephalopathy in setting of acute on chronic hypercapnic respiratory failure.  It was felt to be multifactorial from polypharmacy, heart failure exacerbation and noncompliance with CPAP leading to hypercapnia.  Patient was admitted and placed on BiPAP.  She has continued to improve and was transferred to [facility] services on 4/22.  Assessment & Plan:   Principal Problem:   Acute on chronic respiratory failure with hypoxia and hypercapnia (HCC) Active Problems:   COPD (chronic obstructive pulmonary disease)Gold C   Hypertension   Chronic diastolic CHF (congestive heart failure) (HCC)   Lymphedema   Morbid obesity with BMI of 50.0-59.9, adult (HCC)   Tobacco abuse   Acute on chronic diastolic CHF (congestive heart failure) (HCC)   Obesity hypoventilation syndrome (HCC)   Hyperkalemia   Acute renal failure superimposed on stage 2 chronic kidney disease (HCC)   Acute metabolic encephalopathy   Acute respiratory failure with hypercapnia (HCC)   Acute on chronic hypoxemic, hypercapnic respiratory failure -Continue nasal cannula 2 L which patient wears at baseline, maintain sat 88-92%  -Continue BiPAP nightly -Eval for trilogy device. PFT and ABG completed and awaiting insurance authorization   Acute on chronic diastolic heart failure -Echocardiogram with grade 2 diastolic dysfunction  -Continue Lasix 80 mg twice daily PO   -Strict I's and O's, daily weight -Metoprolol  COPD -Continue Brovana, Pulmicort,  DuoNeb  Acute metabolic encephalopathy -Resolved -Avoid sedating medications  AKI on CKD stage II -Resolved   Hypothyroidism -Continue Synthroid  Depression -Celexa dose decreased as 20mg /daily is maximum dose   DVT prophylaxis: subq hep Code Status: Full Family Communication: No family at bedside Disposition Plan: CM to assist with trilogy device on discharge   Consultants:   PCCM   Procedures:   None   Antimicrobials:  Anti-infectives (From admission, onward)   None       Subjective: No change   Objective: Vitals:   07/03/17 0338 07/03/17 0613 07/03/17 0848 07/03/17 1709  BP:  (!) 109/48 130/64 (!) 118/53  Pulse:  72 88 73  Resp:  16 20 16   Temp:  98.3 F (36.8 C)  98.5 F (36.9 C)  TempSrc:  Oral  Oral  SpO2:  98% 95% 92%  Weight: (!) 147 kg (324 lb)     Height:        Intake/Output Summary (Last 24 hours) at 07/03/2017 1750 Last data filed at 07/03/2017 0836 Gross per 24 hour  Intake 580 ml  Output 1000 ml  Net -420 ml   Filed Weights   07/01/17 0507 07/02/17 0533 07/03/17 0338  Weight: (!) 148.8 kg (328 lb) (!) 144 kg (317 lb 6.4 oz) (!) 147 kg (324 lb)    Examination: General exam: Appears calm and comfortable  Respiratory system: Clear to auscultation, diminished . Respiratory effort normal. Cardiovascular system: S1 & S2 heard, RRR. No JVD, murmurs, rubs, gallops or clicks. +Trace pedal edema. Gastrointestinal system: Abdomen is nondistended, soft and nontender. No organomegaly or masses felt. Normal bowel sounds heard. Central nervous system: Alert and oriented. No focal  neurological deficits. Extremities: Symmetric 5 x 5 power. Skin: No rashes, lesions or ulcers Psychiatry: Judgement and insight appear normal. Mood & affect appropriate.      Data Reviewed: I have personally reviewed following labs and imaging studies  CBC: Recent Labs  Lab 06/26/17 2131 06/27/17 0840 06/28/17 0700 06/29/17 0712  WBC 10.7* 18.0* 13.5* 9.9   NEUTROABS 7.1  --   --   --   HGB 12.7 12.1 11.4* 12.6  HCT 42.9 40.6 38.2 41.2  MCV 91.3 90.8 89.5 88.4  PLT 217 220 192 573   Basic Metabolic Panel: Recent Labs  Lab 06/27/17 0840  06/28/17 0700  06/29/17 0507 06/29/17 1617 06/30/17 0550 07/01/17 0607 07/02/17 0457 07/03/17 0424  NA  --    < > 137   < > 136 133* 135 134* 133* 135  K  --    < > 3.5   < > 3.0* 3.3* 3.0* 3.8 3.1* 4.8  CL  --    < > 93*   < > 90* 88* 90* 92* 88* 93*  CO2  --    < > 31   < > 34* 34* 36* 31 34* 30  GLUCOSE  --    < > 83   < > 109* 130* 88 98 93 96  BUN  --    < > 26*   < > 20 17 17 15 13 14   CREATININE  --    < > 1.67*   < > 1.18* 1.12* 1.09* 0.96 0.96 0.95  CALCIUM  --    < > 8.3*   < > 8.6* 8.8* 8.8* 8.7* 8.5* 8.5*  MG 2.4  --  2.0  --  2.1  --   --  2.0  --   --   PHOS 6.1*  --   --   --   --   --   --   --   --   --    < > = values in this interval not displayed.   GFR: Estimated Creatinine Clearance: 82.9 mL/min (by C-G formula based on SCr of 0.95 mg/dL). Liver Function Tests: Recent Labs  Lab 06/26/17 2131  AST 26  ALT 18  ALKPHOS 122  BILITOT 0.8  PROT 8.5*  ALBUMIN 3.5   No results for input(s): LIPASE, AMYLASE in the last 168 hours. Recent Labs  Lab 06/26/17 2132 06/28/17 0700  AMMONIA 61* 33   Coagulation Profile: No results for input(s): INR, PROTIME in the last 168 hours. Cardiac Enzymes: Recent Labs  Lab 06/26/17 2123 06/27/17 0214 06/27/17 0840  CKTOTAL 54  --   --   TROPONINI  --  <0.03 <0.03   BNP (last 3 results) No results for input(s): PROBNP in the last 8760 hours. HbA1C: No results for input(s): HGBA1C in the last 72 hours. CBG: Recent Labs  Lab 06/29/17 1539 06/29/17 2002 06/30/17 0034 06/30/17 0424 06/30/17 0824  GLUCAP 136* 133* 114* 90 89   Lipid Profile: No results for input(s): CHOL, HDL, LDLCALC, TRIG, CHOLHDL, LDLDIRECT in the last 72 hours. Thyroid Function Tests: No results for input(s): TSH, T4TOTAL, FREET4, T3FREE, THYROIDAB  in the last 72 hours. Anemia Panel: No results for input(s): VITAMINB12, FOLATE, FERRITIN, TIBC, IRON, RETICCTPCT in the last 72 hours. Sepsis Labs: No results for input(s): PROCALCITON, LATICACIDVEN in the last 168 hours.  Recent Results (from the past 240 hour(s))  MRSA PCR Screening     Status: Abnormal   Collection Time: 06/27/17  8:29 AM  Result Value Ref Range Status   MRSA by PCR POSITIVE (A) NEGATIVE Final    Comment:        The GeneXpert MRSA Assay (FDA approved for NASAL specimens only), is one component of a comprehensive MRSA colonization surveillance program. It is not intended to diagnose MRSA infection nor to guide or monitor treatment for MRSA infections. RESULT CALLED TO, READ BACK BY AND VERIFIED WITH: H. D'Adano RN 10:35 06/27/17 ([email or web address]) Performed at [facility], [place] [address], [place], [place] [address]        Radiology Studies: No results found.    Scheduled Meds: . arformoterol  15 mcg Nebulization BID  . aspirin EC  81 mg Oral Daily  . budesonide (PULMICORT) nebulizer solution  0.5 mg Nebulization BID  . citalopram  20 mg Oral Daily  . famotidine  20 mg Oral Daily  . furosemide  80 mg Oral BID  . heparin  5,000 Units Subcutaneous Q8H  . ipratropium-albuterol  3 mL Nebulization BID  . levothyroxine  200 mcg Oral QAC breakfast  . metoprolol tartrate  12.5 mg Oral BID  . pregabalin  100 mg Oral BID  . sodium chloride flush  3 mL Intravenous Q12H   Continuous Infusions: . sodium chloride    . sodium chloride       LOS: 6 days    Time spent: 35 minutes   Dessa Phi, DO [facility] [email or web address] Password [email or web address] 07/03/2017, 5:50 PM

## 2017-07-04 LAB — BASIC METABOLIC PANEL
Anion gap: 9 (ref 5–15)
BUN: 9 mg/dL (ref 6–20)
CO2: 35 mmol/L — ABNORMAL HIGH (ref 22–32)
Calcium: 8.3 mg/dL — ABNORMAL LOW (ref 8.9–10.3)
Chloride: 93 mmol/L — ABNORMAL LOW (ref 101–111)
Creatinine, Ser: 0.84 mg/dL (ref 0.44–1.00)
GFR calc Af Amer: >60 mL/min (ref >60)
GFR calc non Af Amer: >60 mL/min (ref >60)
Glucose, Bld: 92 mg/dL (ref 65–99)
Potassium: 2.7 mmol/L — CL (ref 3.5–5.1)
Sodium: 137 mmol/L (ref 135–145)

## 2017-07-04 LAB — BLOOD GAS, ARTERIAL
Acid-Base Excess: 12.9 mmol/L — ABNORMAL HIGH (ref 0.0–2.0)
Bicarbonate: 38.1 mmol/L — ABNORMAL HIGH (ref 20.0–28.0)
Delivery systems: POSITIVE
Drawn by: 52160
Expiratory PAP: 5
Inspiratory PAP: 10
Mode: POSITIVE
O2 Content: 3 L/min
O2 Saturation: 96.5 %
Patient temperature: 98.6
pCO2 arterial: 59.4 mmHg — ABNORMAL HIGH (ref 32.0–48.0)
pH, Arterial: 7.422 (ref 7.350–7.450)
pO2, Arterial: 89.1 mmHg (ref 83.0–108.0)

## 2017-07-04 MED ORDER — CITALOPRAM HYDROBROMIDE 20 MG PO TABS: 20.0000 mg | ORAL_TABLET | Freq: Every day | ORAL | 0 refills | Status: AC

## 2017-07-04 MED ORDER — ASPIRIN 81 MG PO TBEC: 81.0000 mg | DELAYED_RELEASE_TABLET | Freq: Every day | ORAL | 0 refills | Status: DC

## 2017-07-04 MED ORDER — METOPROLOL TARTRATE 25 MG PO TABS: 12.5000 mg | ORAL_TABLET | Freq: Two times a day (BID) | ORAL | 0 refills | Status: DC

## 2017-07-04 MED ORDER — POTASSIUM CHLORIDE CRYS ER 20 MEQ PO TBCR
40.0000 meq | EXTENDED_RELEASE_TABLET | ORAL | Status: AC
  Administered 2017-07-04 (×2): 40 meq via ORAL
  Filled 2017-07-04 (×2): qty 2

## 2017-07-04 MED ORDER — PREGABALIN 100 MG PO CAPS: 100.0000 mg | ORAL_CAPSULE | Freq: Two times a day (BID) | ORAL | 0 refills | Status: DC

## 2017-07-04 NOTE — Progress Notes (Signed)
CRITICAL VALUE ALERT  Critical Value:  K 2.7  Date & Time Notied:  8:21 07/04/17  Provider Notified: Dr. Maylene Roes  Orders Received/Actions taken:

## 2017-07-04 NOTE — Discharge Summary (Signed)
Physician Discharge Summary  CLESSIE KARRAS OXB:[id number] DOB: 06-Feb-1951 DOA: 06/26/2017  PCP: Leanna Battles, MD  Admit date: 06/26/2017 Discharge date: 07/04/2017  Admitted From: Home Disposition:  Home  Recommendations for Outpatient Follow-up:  1. Follow up with PCP in 1 week 2. Repeat BMP in 1 week.  Hypokalemia was replaced prior to discharge 3. Recommend outpatient sleep study for further pressure titration of BiPAP  Home Health: PT RN Aide  Equipment/Devices: BiPAP until trilogy vent authorized by insurance    Discharge Condition: Stable CODE STATUS: Full  Diet recommendation: Heart healthy   Brief/Interim Summary: Morgan Odonnell is a 67 yo female with past medical history significant for chronic respiratory failure on 2 L nasal cannula O2 at baseline, COPD, OSA not on CPAP, OHS, chronic pain, chronic kidney disease stage II, chronic diastolic heart failure, hypothyroidism, morbid obesity, chronic lymphedema who presented to the emergency department with acute encephalopathy in setting of acute on chronic hypercapnic respiratory failure.  It was felt to be multifactorial from polypharmacy, heart failure exacerbation and noncompliance with CPAP leading to hypercapnia.  Patient was admitted and placed on BiPAP.  She has continued to improve and was transferred to [facility] services on 4/22.  She underwent PFT and ABG for consideration for trilogy device.  Insurance authorization is pending at time of discharge, patient was provided with BiPAP for use at home in the meanwhile.  She was diuresed well and was transitioned to oral Lasix at her home dose with improvement.  On day of discharge, she was feeling well, on her chronic nasal cannula O2 use without any new complaints.  Discharge plan reviewed with patient all questions answered.  Discharge Diagnoses:  Principal Problem:   Acute on chronic respiratory failure with hypoxia and hypercapnia (HCC) Active Problems:   COPD (chronic  obstructive pulmonary disease)Gold C   Hypertension   Chronic diastolic CHF (congestive heart failure) (HCC)   Lymphedema   Morbid obesity with BMI of 50.0-59.9, adult (HCC)   Tobacco abuse   Acute on chronic diastolic CHF (congestive heart failure) (HCC)   Obesity hypoventilation syndrome (HCC)   Hyperkalemia   Acute renal failure superimposed on stage 2 chronic kidney disease (HCC)   Acute metabolic encephalopathy   Acute respiratory failure with hypercapnia (HCC)   Acute on chronic hypoxemic, hypercapnic respiratory failure -Continue nasal cannula 2 L which patient wears at baseline, maintain sat 88-92%  -Continue BiPAP nightly -Eval for trilogy device. PFT and ABG completed and awaiting insurance authorization  -Set up for home BiPAP use nightly until trilogy device authorized  Acute on chronic diastolic heart failure -Echocardiogram with grade 2 diastolic dysfunction  -Continue Lasix 80 mg twice daily PO   -Strict I's and O's, daily weight -Metoprolol  COPD -Continue Brovana, Pulmicort, DuoNeb  Acute metabolic encephalopathy -Resolved -Avoid sedating medications  AKI on CKD stage II -Resolved   Hypothyroidism -Continue Synthroid  Depression -Celexa dose decreased as 20mg /daily is maximum dose   Discharge Instructions  Discharge Instructions    (HEART FAILURE PATIENTS) Call MD:  Anytime you have any of the following symptoms: 1) 3 pound weight gain in 24 hours or 5 pounds in 1 week 2) shortness of breath, with or without a dry hacking cough 3) swelling in the hands, feet or stomach 4) if you have to sleep on extra pillows at night in order to breathe.   Complete by:  As directed    Call MD for:  difficulty breathing, headache or visual disturbances  Complete by:  As directed    Call MD for:  extreme fatigue   Complete by:  As directed    Call MD for:  hives   Complete by:  As directed    Call MD for:  persistant dizziness or light-headedness    Complete by:  As directed    Call MD for:  persistant nausea and vomiting   Complete by:  As directed    Call MD for:  severe uncontrolled pain   Complete by:  As directed    Call MD for:  temperature >100.4   Complete by:  As directed    Diet - low sodium heart healthy   Complete by:  As directed    Discharge instructions   Complete by:  As directed    You were cared for by a hospitalist during your hospital stay. If you have any questions about your discharge medications or the care you received while you were in the hospital after you are discharged, you can call the unit and asked to speak with the hospitalist on call if the hospitalist that took care of you is not available. Once you are discharged, your primary care physician will handle any further medical issues. Please note that NO REFILLS for any discharge medications will be authorized once you are discharged, as it is imperative that you return to your primary care physician (or establish a relationship with a primary care physician if you do not have one) for your aftercare needs so that they can reassess your need for medications and monitor your lab values.   Increase activity slowly   Complete by:  As directed      Allergies as of 07/04/2017      Reactions   Gabapentin    "slept for 2 days"   Novocain [procaine] Other (See Comments)   intolerance      Medication List    STOP taking these medications   verapamil 120 MG tablet Commonly known as:  CALAN     TAKE these medications   AEROCHAMBER Z-STAT PLUS CHAMBR Misc Use as directed   albuterol 108 (90 Base) MCG/ACT inhaler Commonly known as:  PROVENTIL HFA;VENTOLIN HFA Inhale 2 puffs into the lungs every 6 (six) hours as needed for wheezing.   aspirin 81 MG EC tablet Take 1 tablet (81 mg total) by mouth daily. Start taking on:  07/05/2017   baclofen 10 MG tablet Commonly known as:  LIORESAL Take 1 tablet by mouth 4 (four) times daily as needed for muscle  spasms.   citalopram 20 MG tablet Commonly known as:  CELEXA Take 1 tablet (20 mg total) by mouth daily. What changed:    medication strength  how much to take   diclofenac sodium 1 % Gel Commonly known as:  VOLTAREN Apply 1 application topically 4 (four) times daily. Applied to knees, shoulders, wrists, and ankles   furosemide 40 MG tablet Commonly known as:  LASIX 2 tablets (80mg ) by mouth two times a day   HYDROcodone-acetaminophen 5-325 MG tablet Commonly known as:  NORCO/VICODIN Take 1-2 tablets by mouth every 4 (four) hours as needed. What changed:  reasons to take this   levothyroxine 200 MCG tablet Commonly known as:  SYNTHROID, LEVOTHROID Take 200 mcg by mouth daily before breakfast.   metoprolol tartrate 25 MG tablet Commonly known as:  LOPRESSOR Take 0.5 tablets (12.5 mg total) by mouth 2 (two) times daily. What changed:  how much to take   nicotine polacrilex  4 MG gum Commonly known as:  NICORETTE Take 4 mg by mouth as needed for smoking cessation.   potassium chloride SA 20 MEQ tablet Commonly known as:  K-DUR,KLOR-CON 2 tablets (40 mEq) by mouth in the AM and 1 tablet (20 mEq) by mouth in the PM   pregabalin 100 MG capsule Commonly known as:  LYRICA Take 1 capsule (100 mg total) by mouth 2 (two) times daily. What changed:    medication strength  how much to take   ranitidine 150 MG tablet Commonly known as:  ZANTAC Take 1 tablet (150 mg total) by mouth at bedtime.   SPIRIVA HANDIHALER 18 MCG inhalation capsule Generic drug:  tiotropium PLACE 1 CAPSULE (18 MCG TOTAL) INTO INHALER AND INHALE DAILY.   SYMBICORT 160-4.5 MCG/ACT inhaler Generic drug:  budesonide-formoterol INHALE TWO puffs into lung TWICE DAILY            Durable Medical Equipment  (From admission, onward)        Start     Ordered   07/04/17 1318  For home use only DME Bipap  Once    Question Answer Comment  Bleed in oxygen (LPM) 3 liters   Inspiratory pressure 10    Expiratory pressure 5      07/04/17 1319     Follow-up Information    Leanna Battles, MD. Schedule an appointment as soon as possible for a visit on 07/11/2017.   Specialty:  Internal Medicine Contact information: [place] [address] [phone]          Allergies  Allergen Reactions  . Gabapentin     "slept for 2 days"  . Novocain [Procaine] Other (See Comments)    intolerance    Consultations:  PCCM   Procedures/Studies: Ct Head Wo Contrast  Result Date: 06/27/2017 CLINICAL DATA:  Acute onset of altered mental status. Lethargy. EXAM: CT HEAD WITHOUT CONTRAST TECHNIQUE: Contiguous axial images were obtained from the base of the skull through the vertex without intravenous contrast. COMPARISON:  CT of the head performed 05/17/2010 FINDINGS: Brain: No evidence of acute infarction, hemorrhage, hydrocephalus, extra-axial collection or mass lesion/mass effect. Mild periventricular white matter change likely reflects small vessel ischemic microangiopathy. The posterior fossa, including the cerebellum, brainstem and fourth ventricle, is within normal limits. The third and lateral ventricles, and basal ganglia are unremarkable in appearance. The cerebral hemispheres are symmetric in appearance, with normal gray-white differentiation. No mass effect or midline shift is seen. Vascular: No hyperdense vessel or unexpected calcification. Skull: There is no evidence of fracture; visualized osseous structures are unremarkable in appearance. Sinuses/Orbits: The orbits are within normal limits. The paranasal sinuses and mastoid air cells are well-aerated. Other: No significant soft tissue abnormalities are seen. IMPRESSION: 1. No acute intracranial pathology seen on CT. 2. Mild small vessel ischemic microangiopathy. Electronically Signed   By: Garald Balding M.D.   On: 06/27/2017 00:06   Dg Chest Port 1 View  Result Date: 06/28/2017 CLINICAL DATA:  Patient admitted to the  hospital 06/27/2017 with altered mental status and shortness of breath. EXAM: PORTABLE CHEST 1 VIEW COMPARISON:  PA and lateral chest 05/01/2015. Single-view of the chest 06/26/2017. CT chest 10/29/2007. FINDINGS: The lungs appear emphysematous. There is cardiomegaly and interstitial edema. No pneumothorax or pleural effusion. No consolidative process. IMPRESSION: No change cardiomegaly and interstitial edema. Emphysema. Electronically Signed   By: Inge Rise M.D.   On: 06/28/2017 08:47   Dg Chest Port 1 View  Result Date: 06/26/2017 CLINICAL  DATA:  Lethargy.  History of COPD. EXAM: PORTABLE CHEST 1 VIEW COMPARISON:  Chest radiograph May 01, 2015 FINDINGS: Cardiac silhouette is similarly enlarged. Mediastinal silhouette is nonsuspicious. Diffuse interstitial prominence. LEFT lower lung zone similar scarring. Small LEFT pleural effusion. No pneumothorax. Soft tissue planes and included osseous structures are nonsuspicious. IMPRESSION: Cardiomegaly. Interstitial prominence seen with pulmonary edema, probable background of COPD. Small LEFT pleural effusion. Electronically Signed   By: Elon Alas M.D.   On: 06/26/2017 22:13    Echo 4/19 Study Conclusions  - Left ventricle: The cavity size was normal. Wall thickness was   normal. Although no diagnostic regional wall motion abnormality   was identified, this possibility cannot be completely excluded on   the basis of this study. Features are consistent with a   pseudonormal left ventricular filling pattern, with concomitant   abnormal relaxation and increased filling pressure (grade 2   diastolic dysfunction). - Aortic valve: There was trivial regurgitation. - Mitral valve: Moderately calcified annulus. - Left atrium: The atrium was mildly dilated. - Right ventricle: The cavity size was mildly dilated.    Discharge Exam: Vitals:   07/04/17 0946 07/04/17 1136  BP: (!) 103/43 (!) 103/53  Pulse: 80 73  Resp:  20  Temp:  98.2 F  (36.8 C)  SpO2:  97%    General: Pt is alert, awake, not in acute distress Cardiovascular: RRR, S1/S2 +, no rubs, no gallops Respiratory: Diminished breath sounds bilaterally, no wheezing, no rhonchi Abdominal: Soft, NT, ND, bowel sounds + Extremities: no pitting edema, no cyanosis Skin: Bilateral lower extremity with chronic venous stasis changes    The results of significant diagnostics from this hospitalization (including imaging, microbiology, ancillary and laboratory) are listed below for reference.     Microbiology: Recent Results (from the past 240 hour(s))  MRSA PCR Screening     Status: Abnormal   Collection Time: 06/27/17  8:29 AM  Result Value Ref Range Status   MRSA by PCR POSITIVE (A) NEGATIVE Final    Comment:        The GeneXpert MRSA Assay (FDA approved for NASAL specimens only), is one component of a comprehensive MRSA colonization surveillance program. It is not intended to diagnose MRSA infection nor to guide or monitor treatment for MRSA infections. RESULT CALLED TO, READ BACK BY AND VERIFIED WITH: H. D'Adano RN 10:35 06/27/17 ([email or web address]) Performed at [facility], [place] [address] [place], [place] [address]      Labs: BNP (last 3 results) Recent Labs    06/26/17 2131  BNP 425.9*   Basic Metabolic Panel: Recent Labs  Lab 06/28/17 0700  06/29/17 0507  06/30/17 0550 07/01/17 0607 07/02/17 0457 07/03/17 0424 07/04/17 0600  NA 137   < > 136   < > 135 134* 133* 135 137  K 3.5   < > 3.0*   < > 3.0* 3.8 3.1* 4.8 2.7*  CL 93*   < > 90*   < > 90* 92* 88* 93* 93*  CO2 31   < > 34*   < > 36* 31 34* 30 35*  GLUCOSE 83   < > 109*   < > 88 98 93 96 92  BUN 26*   < > 20   < > 17 15 13 14 9   CREATININE 1.67*   < > 1.18*   < > 1.09* 0.96 0.96 0.95 0.84  CALCIUM 8.3*   < > 8.6*   < > 8.8* 8.7* 8.5* 8.5*  8.3*  MG 2.0  --  2.1  --   --  2.0  --   --   --    < > = values in this interval not displayed.   Liver Function Tests: No results for  input(s): AST, ALT, ALKPHOS, BILITOT, PROT, ALBUMIN in the last 168 hours. No results for input(s): LIPASE, AMYLASE in the last 168 hours. Recent Labs  Lab 06/28/17 0700  AMMONIA 33   CBC: Recent Labs  Lab 06/28/17 0700 06/29/17 0712  WBC 13.5* 9.9  HGB 11.4* 12.6  HCT 38.2 41.2  MCV 89.5 88.4  PLT 192 203   Cardiac Enzymes: No results for input(s): CKTOTAL, CKMB, CKMBINDEX, TROPONINI in the last 168 hours. BNP: Invalid input(s): POCBNP CBG: Recent Labs  Lab 06/29/17 1539 06/29/17 2002 06/30/17 0034 06/30/17 0424 06/30/17 0824  GLUCAP 136* 133* 114* 90 89   D-Dimer No results for input(s): DDIMER in the last 72 hours. Hgb A1c No results for input(s): HGBA1C in the last 72 hours. Lipid Profile No results for input(s): CHOL, HDL, LDLCALC, TRIG, CHOLHDL, LDLDIRECT in the last 72 hours. Thyroid function studies No results for input(s): TSH, T4TOTAL, T3FREE, THYROIDAB in the last 72 hours.  Invalid input(s): FREET3 Anemia work up No results for input(s): VITAMINB12, FOLATE, FERRITIN, TIBC, IRON, RETICCTPCT in the last 72 hours. Urinalysis    Component Value Date/Time   COLORURINE YELLOW 06/27/2017 0045   APPEARANCEUR HAZY (A) 06/27/2017 0045   LABSPEC 1.014 06/27/2017 0045   PHURINE 5.0 06/27/2017 0045   GLUCOSEU NEGATIVE 06/27/2017 0045   HGBUR SMALL (A) 06/27/2017 0045   BILIRUBINUR NEGATIVE 06/27/2017 0045   KETONESUR NEGATIVE 06/27/2017 0045   PROTEINUR 30 (A) 06/27/2017 0045   UROBILINOGEN 0.2 04/01/2007 1526   NITRITE NEGATIVE 06/27/2017 0045   LEUKOCYTESUR NEGATIVE 06/27/2017 0045   Sepsis Labs Invalid input(s): PROCALCITONIN,  WBC,  LACTICIDVEN Microbiology Recent Results (from the past 240 hour(s))  MRSA PCR Screening     Status: Abnormal   Collection Time: 06/27/17  8:29 AM  Result Value Ref Range Status   MRSA by PCR POSITIVE (A) NEGATIVE Final    Comment:        The GeneXpert MRSA Assay (FDA approved for NASAL specimens only), is one  component of a comprehensive MRSA colonization surveillance program. It is not intended to diagnose MRSA infection nor to guide or monitor treatment for MRSA infections. RESULT CALLED TO, READ BACK BY AND VERIFIED WITH: H. D'Adano RN 10:35 06/27/17 ([email or web address]) Performed at [facility], [place] [address], [place], [place] [address]      Patient was seen and examined on the day of discharge and was found to be in stable condition. Time coordinating discharge: 40 minutes including assessment and coordination of care, as well as examination of the patient.   SIGNED:  Dessa Phi, DO [facility] Pager [phone]  If 7PM-7AM, please contact night-coverage [email or web address] Password [email or web address] 07/04/2017, 1:48 PM

## 2017-07-04 NOTE — Progress Notes (Signed)
IV and tele removed. Discharge instructions given, questions answered. Awaiting equipment from [facility].

## 2017-07-04 NOTE — Progress Notes (Addendum)
[facility] called for determination for the Trilogy home vent; per [facility], they will not make a determination until Monday 07/07/2017; [place] called for possible Letter of Guarantee to obtain the machine for discharge home; [place] with [facility] called to determine the cost to rent the machine; awaiting a call back. Mindi Slicker RN,MHA,BSN [phone]   1:22 pm- CM talked to [facility] with [facility], they will provide a BIPAP for the patient until the Trilogy is authorized by the insurance agency. Attending MD updated; Mindi Slicker RN,MHA,BSN [phone]  2:48 pm - LOG ( Letter of Guarantee) faxed to [facility] as requested; [facility] request [facility] [phone]  3:46 pm - Received message from [facility] that [facility] is requesting a stat ABG on BIPAP to approve Trilogy; MD made aware, orders in. B Adean Milosevic RN,MHA,BSN    4:29 pm - CM talked to [facility], she has been approved for the BIPAP and it will be delivered to her room today prior to going home; B [employer]

## 2017-07-10 ENCOUNTER — Telehealth (HOSPITAL_COMMUNITY): Payer: Self-pay | Admitting: Vascular Surgery

## 2017-07-10 NOTE — Telephone Encounter (Signed)
Returned pt call to make appt w/ [facility], pt has canceled and no showed last 2 appt , pt has not been seen in 3 years so she will need to be scheduled as a new pt

## 2017-10-13 ENCOUNTER — Ambulatory Visit: Payer: Medicare Other | Admitting: Pulmonary Disease

## 2017-12-08 ENCOUNTER — Ambulatory Visit: Payer: Medicare Other | Admitting: Pulmonary Disease

## 2017-12-10 ENCOUNTER — Ambulatory Visit: Payer: Medicare Other | Admitting: Pulmonary Disease

## 2018-02-02 ENCOUNTER — Ambulatory Visit: Payer: Medicare Other | Admitting: Pulmonary Disease

## 2018-02-17 ENCOUNTER — Ambulatory Visit: Payer: Medicare Other | Admitting: Pulmonary Disease

## 2018-05-27 ENCOUNTER — Institutional Professional Consult (permissible substitution): Payer: Self-pay | Admitting: Neurology

## 2018-07-08 ENCOUNTER — Telehealth: Payer: Self-pay | Admitting: Pulmonary Disease

## 2018-07-08 NOTE — Telephone Encounter (Signed)
Pt last seen by JN in 2018, seen [place] 04/29/17 Pt is needing refills on all inhalers, and her grandkids threw away her symbicort yesterday Advised patient that she will need to be seen or have a televisit over the phone in order to have these refilled as she has not been seen in our office in over one year. Patient advised that she will not be leaving her home due to the COVID-19 Scheduled patient with TP for televisit 04/30/*20 at 430pm Pt verbalized understanding Nothing further needed

## 2018-07-09 ENCOUNTER — Other Ambulatory Visit: Payer: Self-pay

## 2018-07-09 ENCOUNTER — Ambulatory Visit (INDEPENDENT_AMBULATORY_CARE_PROVIDER_SITE_OTHER): Payer: Medicare Other | Admitting: Adult Health

## 2018-07-09 ENCOUNTER — Encounter: Payer: Self-pay | Admitting: Adult Health

## 2018-07-09 DIAGNOSIS — J9611 Chronic respiratory failure with hypoxia: Secondary | ICD-10-CM | POA: Diagnosis not present

## 2018-07-09 DIAGNOSIS — J449 Chronic obstructive pulmonary disease, unspecified: Secondary | ICD-10-CM | POA: Diagnosis not present

## 2018-07-09 MED ORDER — BUDESONIDE-FORMOTEROL FUMARATE 160-4.5 MCG/ACT IN AERO: 2.0000 | INHALATION_SPRAY | Freq: Two times a day (BID) | RESPIRATORY_TRACT | 0 refills | Status: DC

## 2018-07-09 NOTE — Progress Notes (Signed)
Virtual Visit via Telephone Note  I connected with Morgan Odonnell on 07/09/18 at  4:30 PM EDT by telephone and verified that I am speaking with the correct person using two identifiers.  Location: Patient: Home  Provider: Office    I discussed the limitations, risks, security and privacy concerns of performing an evaluation and management service by telephone and the availability of in person appointments. I also discussed with the patient that there may be a patient responsible charge related to this service. The patient expressed understanding and agreed to proceed.   History of Present Illness: Today's  tele-visit is for follow-up of COPD and oxygen dependent respiratory failure  68 year old female former smoker followed for COPD, chronic hypoxic and hypercarbic respiratory failure   Patient is followed for COPD and oxygen dependent respiratory failure.  This is a 1 year follow-up.   She remains on Symbicort and Spiriva.  She is on oxygen 3 L. Appetite is good , no n/v.d.  Says she had flu like illness in February , resolved after few weeks. She was treated with Zpack. Feels she is back to her baseline .  Took a while to get back to her baseline.  Cough is resolved.  Says that she needs a sample of Symbicort , is out .  Gets winded with minimal activity . Is able to take shower. Uses shower chair .  Denies chest pain,orthonea, or increased edema.   She was admitted last 06/2017 for COPD exacerbation .  Chest xray 06/2017 showed copd changes.    Observations/Objective: PFT 12/13/15: FVC 1.34 L (46%) FEV1 1.14 L (50%) FEV1/FVC 0.84 FEF 25-75 1.24 L (89%) negative bronchodilator response TLC 3.10 L (65%) RV 73% ERV 15% DLCO corrected 55% (Hgb 14.7) 06/24/12: FVC 1.60 L (56%) FEV1 1.11 L (53%) FEV1/FVC 0.70 FEF 25-75 0.63 L (25%) negative bronchodilator response TLC 2.96 L (66%) RV 81% ERV 30% DLCO uncorrected 52%  POLYSOMNOGRAM (05/22/12):Average awake saturation on room air  89%. Sleep latency 68.5 minutes. 0 periodic leg movements. Lowest saturation 83% on room air with 290.3 minutes with saturations between 60-90%.No significant sleep apnea with an overall AHI 3.3 events/hour &RDI 4.9/hr.hypercarbic and retaining CO2 above 50 torr. Rare snoring.  LABS 05/10/15 Alpha-1 antitrypsin: MM (165)   Assessment and Plan: COPD - Stable  O2 RF - stable   Plan  Patient Instructions  Continue on Symbicort and Spiriva Will put a sample at front desk for your Symbicort .  Continue on Oxygen 3l.   Follow-up with Dr. Elsworth Soho in 6 months and as needed Please contact office for sooner follow up if symptoms do not improve or worsen or seek emergency care        Follow Up Instructions: Follow up with Dr. Elsworth Soho  In 6 months and As needed   Please contact office for sooner follow up if symptoms do not improve or worsen or seek emergency care     I discussed the assessment and treatment plan with the patient. The patient was provided an opportunity to ask questions and all were answered. The patient agreed with the plan and demonstrated an understanding of the instructions.   The patient was advised to call back or seek an in-person evaluation if the symptoms worsen or if the condition fails to improve as anticipated.  I provided 24  minutes of non-face-to-face time during this encounter.   Rexene Edison, NP

## 2018-07-09 NOTE — Addendum Note (Signed)
Addended by: Dolores Lory on: 07/09/2018 05:25 PM   Modules accepted: Orders

## 2018-07-09 NOTE — Patient Instructions (Addendum)
Continue on Symbicort and Spiriva Will put a sample at front desk for your Symbicort .  Continue on Oxygen 3l.   Follow-up with Dr. Elsworth Soho in 6 months and as needed Please contact office for sooner follow up if symptoms do not improve or worsen or seek emergency care

## 2019-01-05 ENCOUNTER — Ambulatory Visit (INDEPENDENT_AMBULATORY_CARE_PROVIDER_SITE_OTHER): Payer: Self-pay | Admitting: Family Medicine

## 2019-01-13 ENCOUNTER — Ambulatory Visit (INDEPENDENT_AMBULATORY_CARE_PROVIDER_SITE_OTHER): Payer: Medicare Other | Admitting: Family Medicine

## 2019-01-13 ENCOUNTER — Other Ambulatory Visit: Payer: Self-pay

## 2019-01-13 ENCOUNTER — Encounter (INDEPENDENT_AMBULATORY_CARE_PROVIDER_SITE_OTHER): Payer: Self-pay | Admitting: Family Medicine

## 2019-01-13 ENCOUNTER — Encounter: Payer: Self-pay | Admitting: Family Medicine

## 2019-01-13 VITALS — BP 112/65 | HR 96 | Temp 98.4°F | Ht 62.0 in | Wt 322.0 lb

## 2019-01-13 DIAGNOSIS — E038 Other specified hypothyroidism: Secondary | ICD-10-CM

## 2019-01-13 DIAGNOSIS — G894 Chronic pain syndrome: Secondary | ICD-10-CM

## 2019-01-13 DIAGNOSIS — R0602 Shortness of breath: Secondary | ICD-10-CM | POA: Diagnosis not present

## 2019-01-13 DIAGNOSIS — I5032 Chronic diastolic (congestive) heart failure: Secondary | ICD-10-CM

## 2019-01-13 DIAGNOSIS — R5383 Other fatigue: Secondary | ICD-10-CM

## 2019-01-13 DIAGNOSIS — F3289 Other specified depressive episodes: Secondary | ICD-10-CM

## 2019-01-13 DIAGNOSIS — Z6841 Body Mass Index (BMI) 40.0 and over, adult: Secondary | ICD-10-CM | POA: Diagnosis not present

## 2019-01-13 DIAGNOSIS — Z0289 Encounter for other administrative examinations: Secondary | ICD-10-CM

## 2019-01-13 NOTE — Progress Notes (Signed)
Office: [phone]  /  Fax: [phone]   Dear Dr. Leanna Battles,   Thank you for referring Morgan Odonnell to our clinic. The following note includes my evaluation and treatment recommendations.  HPI:   Chief Complaint: OBESITY    Morgan Odonnell has been referred by Dr. Leanna Battles for consultation regarding her obesity and obesity related comorbidities.    Morgan Odonnell (MR# [id number]) is a 68 y.o. female who presents on 01/13/2019 for obesity evaluation and treatment. Current BMI is Body mass index is 58.89 kg/m.Morgan Odonnell Morgan Odonnell has been struggling with her weight for many years and has been unsuccessful in either losing weight, maintaining weight loss, or reaching her healthy weight goal.     Morgan Odonnell attended our information session and states she is currently in the action stage of change and ready to dedicate time achieving and maintaining a healthier weight. Morgan Odonnell is interested in becoming our patient and working on intensive lifestyle modifications including (but not limited to) diet, exercise and weight loss.     Morgan Odonnell states she previously wanted bariatric surgery, but she is not a candidate due to respiratory/cardiac disease.    Morgan Odonnell states her family eats meals together she struggles with family and or coworkers weight loss sabotage her desired weight loss is 182 lbs she started gaining weight after her second pregnancy her heaviest weight ever was 350 lbs. she has significant food cravings issues  she snacks frequently in the evenings she wakes up frquently in the middle of the night to eat she skips breakfast 3-4 times per week she frequently makes poor food choices she has problems with excessive hunger  she frequently eats larger portions than normal  she has binge eating behaviors she struggles with emotional eating    Morgan Odonnell feels her energy is lower than it should be. This has worsened with weight gain and has worsened  recently. Morgan Odonnell admits to daytime somnolence and  denies waking up still tired. Patient is at risk for obstructive sleep apnea. Patent has a history of symptoms of morning headache. Patient generally gets 3-4 hours of sleep per night, and states they generally do not sleep well most nights. Snoring is present. Apneic episodes are not present. Epworth Sleepiness Score is 8.  Dyspnea on exertion Morgan Odonnell notes increasing shortness of breath with exercising and seems to be worsening over time with weight gain. She notes getting out of breath sooner with activity than she used to. This has gotten worse recently. Morgan Odonnell has a history of COPD, CHF, obesity, hyperventilation syndrome, and chronic respiratory failure and is on 3 liters nasal cannula oxygen constantly.  Hypothyroidism Morgan Odonnell has a diagnosis of hypothyroidism. She is on levothyroxine. She denies hot or cold intolerance or palpitations, but does admit to ongoing Morgan.  Depression with emotional eating behaviors Morgan Odonnell is struggling with emotional eating and using food for comfort to the extent that it is negatively impacting her health. She often snacks when she is not hungry. Morgan Odonnell sometimes feels she is out of control and then feels guilty that she made poor food choices. She has been working on behavior modification techniques to help reduce her emotional eating and has been somewhat successful. She shows no sign of suicidal or homicidal ideations.  Depression Screen [name]'s Food and Mood (modified PHQ-9) score was 24. Depression screen PHQ 2/9 01/13/2019  Decreased Interest 3  Down, Depressed, Hopeless 2  PHQ - 2 Score 5  Altered sleeping 3  Tired, decreased energy 3  Change  in appetite 3  Feeling bad or failure about yourself  3  Trouble concentrating 2  Moving slowly or fidgety/restless 3  Suicidal thoughts 2  PHQ-9 Score 24  Difficult doing work/chores Very difficult   Chronic Pain Syndrome Morgan Odonnell  has chronic pain syndrome. She is not a candidate for surgery due to her weight and respiratory status.  ASSESSMENT AND PLAN:  Other Morgan - Plan: EKG 12-Lead, Comprehensive metabolic panel, CBC with Differential/Platelet, Hemoglobin A1c, Insulin, random, VITAMIN D 25 Hydroxy (Vit-D Deficiency, Fractures), Anemia panel  Shortness of breath on exertion - Plan: Lipid Panel With LDL/HDL Ratio, ECHOCARDIOGRAM COMPLETE  Other specified hypothyroidism - Plan: T3, T4, free, TSH  Chronic pain syndrome  Other depression - with emotional eating  Class 3 severe obesity with serious comorbidity and body mass index (BMI) of 50.0 to 59.9 in adult, unspecified obesity type (HCC)  PLAN:  Morgan Morgan Odonnell was informed that her Morgan may be related to obesity, depression or many other causes. Labs will be ordered, and in the meanwhile Morgan Odonnell has agreed to work on diet, exercise and weight loss to help with Morgan. Proper sleep hygiene was discussed including the need for 7-8 hours of quality sleep each night. A sleep study was not ordered based on symptoms and Epworth score.  Dyspnea on exertion Morgan Odonnell's shortness of breath appears to be obesity related and exercise induced. She has agreed to work on weight loss and gradually increase exercise to treat her exercise induced shortness of breath. If Morgan Odonnell follows our instructions and loses weight without improvement of her shortness of breath, we will plan to refer to pulmonology. We will monitor this condition regularly. Morgan Odonnell agrees to this plan. She will be referred to Cardiology.  Hypothyroidism Morgan Odonnell was informed of the importance of good thyroid control to help with weight loss efforts. She was also informed that supertheraputic thyroid levels are dangerous and will not improve weight loss results. Morgan Odonnell will continue levothyroxine as directed and will have labs checked today.  Depression with Emotional Eating Behaviors We  discussed behavior modification techniques today to help Morgan Odonnell deal with her emotional eating and depression. Morgan Odonnell will be referred to Dr. Mallie Mussel, our bariatric psychologist, for evaluation.  Depression Screen Morgan Odonnell had a strongly positive depression screening. Depression is commonly associated with obesity and often results in emotional eating behaviors. We will monitor this closely and work on CBT to help improve the non-hunger eating patterns. Referral to Psychology may be required if no improvement is seen as she continues in our clinic.  Chronic Pain Syndrome Janae was instructed to continue her medications per her pain physician.  Obesity Moneshia is currently in the action stage of change and her goal is to continue with weight loss efforts. I recommend Ikeisha begin the structured treatment plan as follows:  She has agreed to follow the Category 2 plan.  Allyzon will be referred to Cardiology since she has not seen them in years.  Emiree has been instructed to exercise as tolerated for weight loss and overall health benefits. We discussed the following Behavioral Modification Strategies today: increasing lean protein intake, decreasing simple carbohydrates, increasing vegetables, increase H20 intake, and keeping healthy foods in the home.   She was informed of the importance of frequent follow-up visits to maximize her success with intensive lifestyle modifications for her multiple health conditions. She was informed we would discuss her lab results at her next visit unless there is a critical issue that needs to be addressed sooner. Mirha agreed  to keep her next visit at the agreed upon time to discuss these results.  ALLERGIES: Allergies  Allergen Reactions  . Gabapentin     "slept for 2 days"  . Novocain [Procaine] Other (See Comments)    intolerance    MEDICATIONS: Current Outpatient Medications on File Prior to Visit  Medication Sig Dispense  Refill  . albuterol (PROVENTIL HFA;VENTOLIN HFA) 108 (90 BASE) MCG/ACT inhaler Inhale 2 puffs into the lungs every 6 (six) hours as needed for wheezing.     . baclofen (LIORESAL) 10 MG tablet Take 1 tablet by mouth 4 (four) times daily as needed for muscle spasms.     Morgan Odonnell buPROPion (WELLBUTRIN) 100 MG tablet Take 100 mg by mouth 2 (two) times daily.    . celecoxib (CELEBREX) 200 MG capsule Take 200 mg by mouth daily.    . citalopram (CELEXA) 20 MG tablet Take 1 tablet (20 mg total) by mouth daily. 30 tablet 0  . diclofenac sodium (VOLTAREN) 1 % GEL Apply 1 application topically 4 (four) times daily. Applied to knees, shoulders, wrists, and ankles    . diphenhydrAMINE (BENADRYL) 50 MG capsule Take 50 mg by mouth every 6 (six) hours as needed.    . furosemide (LASIX) 40 MG tablet 2 tablets (80mg ) by mouth two times a day 120 tablet 1  . levothyroxine (SYNTHROID, LEVOTHROID) 200 MCG tablet Take 200 mcg by mouth daily before breakfast.    . metoprolol tartrate (LOPRESSOR) 25 MG tablet Take 0.5 tablets (12.5 mg total) by mouth 2 (two) times daily. 30 tablet 0  . nicotine polacrilex (NICORETTE) 4 MG gum Take 4 mg by mouth as needed for smoking cessation.    Morgan Odonnell oxyCODONE-acetaminophen (PERCOCET/ROXICET) 5-325 MG tablet Take by mouth every 4 (four) hours as needed for severe pain.    . potassium chloride SA (K-DUR,KLOR-CON) 20 MEQ tablet 2 tablets (40 mEq) by mouth in the AM and 1 tablet (20 mEq) by mouth in the PM 90 tablet 0  . pregabalin (LYRICA) 100 MG capsule Take 1 capsule (100 mg total) by mouth 2 (two) times daily. (Patient taking differently: Take 50 mg by mouth 2 (two) times daily. ) 60 capsule 0  . SPIRIVA HANDIHALER 18 MCG inhalation capsule PLACE 1 CAPSULE (18 MCG TOTAL) INTO INHALER AND INHALE DAILY. 30 capsule 6  . SYMBICORT 160-4.5 MCG/ACT inhaler INHALE TWO puffs into lung TWICE DAILY 10.2 g 11  . aspirin EC 81 MG EC tablet Take 1 tablet (81 mg total) by mouth daily. 30 tablet 0  .  Spacer/Aero-Holding Chambers (AEROCHAMBER Z-STAT PLUS CHAMBR) MISC Use as directed 1 each 0   No current facility-administered medications on file prior to visit.     PAST MEDICAL HISTORY: Past Medical History:  Diagnosis Date  . Allergies   . Anemia   . Back pain   . Bilateral swelling of feet   . CHF (congestive heart failure) ([place])   . Chronic cough   . COPD (chronic obstructive pulmonary disease) ([place])   . Depression   . GERD (gastroesophageal reflux disease)   . Heart murmur   . Hypertension   . Hypocapnia   . Hypothyroid   . Knee pain   . Leg edema    chronic, bilateral  . Lightheaded   . Low back pain   . Morbid obesity ([place])   . Neck pain   . Obesity hypoventilation syndrome ([place])   . Oral lesion   . Shortness of breath   . Shortness  of breath   . Shoulder pain   . Snoring     PAST SURGICAL HISTORY: Past Surgical History:  Procedure Laterality Date  . APPENDECTOMY    . CHOLECYSTECTOMY    . COLONOSCOPY N/A 06/25/2013   Procedure: COLONOSCOPY;  Surgeon: [name], MD;  Location: [facility];  Service: Endoscopy;  Laterality: N/A;  . ECTOPIC PREGNANCY SURGERY    . Left Ovary Removed    . MENISCUS REPAIR    . TONSILLECTOMY    . TUBAL LIGATION      SOCIAL HISTORY: Social History   Tobacco Use  . Smoking status: Former Smoker    Packs/day: 2.00    Years: 45.00    Pack years: 90.00    Types: Cigarettes    Quit date: 04/12/2015    Years since quitting: 3.7  . Smokeless tobacco: Never Used  . Tobacco comment: still chews nicotine gum  Substance Use Topics  . Alcohol use: No    Alcohol/week: 0.0 standard drinks  . Drug use: No    FAMILY HISTORY: Family History  Problem Relation Age of Onset  . Ovarian cancer Mother   . Lung disease Mother   . Heart failure Mother   . Hypertension Mother   . Heart disease Mother   . Seizures Mother   . Cancer Mother   . Depression Mother   . Sleep apnea Mother   . Alcoholism Mother   . Eating disorder  Mother   . Obesity Mother   . Hypertension Father   . Eating disorder Father   . Obesity Father   . Anxiety disorder Father   . Lymphoma Brother   . Ovarian cancer Maternal Aunt   . Asthma Daughter    ROS: Review of Systems  Constitutional: Positive for malaise/Morgan.  HENT: Positive for sinus pain and tinnitus.        Positive for decreased hearing. Positive for ear drainage. Positive for nose stuffiness. Positive for hay fever. Positive for dentures. Positive for dry mouth. Positive for hoarseness.  Eyes:       Positive for vision changes.  Respiratory: Positive for cough, shortness of breath (with activity) and wheezing.   Cardiovascular: Negative for palpitations and orthopnea.  Gastrointestinal: Positive for constipation and heartburn.  Genitourinary:       Positive for frequent urination.  Musculoskeletal: Positive for back pain, joint pain and neck pain.       Positive for neck stiffness. Positive for calf/leg pain with walking. Positive for leg cramping. Positive for muscle stiffness.  Skin: Positive for itching and rash.       Positive for skin dryness. Positive for hair or nail changes.  Neurological: Positive for dizziness, tremors and headaches.       Positive for very cold feet or hands.  Endo/Heme/Allergies: Positive for polydipsia. Bruises/bleeds easily.       Positive for hot/cold intolerance. Positive for polyphagia.  Psychiatric/Behavioral: Positive for depression (emotional eating). Negative for suicidal ideas.       Negative for homicidal ideas.   PHYSICAL EXAM: Blood pressure 112/65, pulse 96, temperature 98.4 F (36.9 C), temperature source Oral, height 5\' 2"  (1.575 m), weight (!) 322 lb (146.1 kg), SpO2 98 %. Body mass index is 58.89 kg/m. Physical Exam Vitals signs reviewed.  Constitutional:      Appearance: Normal appearance. She is well-developed. She is obese.  HENT:     Head: Normocephalic and atraumatic.     Nose: Nose normal.    Eyes:  General: No scleral icterus. Neck:     Musculoskeletal: Normal range of motion.  Cardiovascular:     Rate and Rhythm: Normal rate and regular rhythm.  Pulmonary:     Effort: Pulmonary effort is normal. No respiratory distress.  Abdominal:     Palpations: Abdomen is soft.     Tenderness: There is no abdominal tenderness.  Musculoskeletal: Normal range of motion.     Comments: Range of motion normal in all four extremities.  Skin:    General: Skin is warm and dry.  Neurological:     Mental Status: She is alert and oriented to person, place, and time.     Coordination: Coordination normal.     Comments: In a wheelchair with 3 liters nasal cannula in place.  Psychiatric:        Mood and Affect: Mood and affect normal.        Behavior: Behavior normal.   RECENT LABS AND TESTS: BMET    Component Value Date/Time   NA 137 07/04/2017 0600   K 2.7 (LL) 07/04/2017 0600   CL 93 (L) 07/04/2017 0600   CO2 35 (H) 07/04/2017 0600   GLUCOSE 92 07/04/2017 0600   BUN 9 07/04/2017 0600   CREATININE 0.84 07/04/2017 0600   CALCIUM 8.3 (L) 07/04/2017 0600   GFRNONAA >60 07/04/2017 0600   GFRAA >60 07/04/2017 0600   No results found for: HGBA1C No results found for: INSULIN CBC    Component Value Date/Time   WBC 9.9 06/29/2017 0712   RBC 4.66 06/29/2017 0712   HGB 12.6 06/29/2017 0712   HCT 41.2 06/29/2017 0712   PLT 203 06/29/2017 0712   MCV 88.4 06/29/2017 0712   MCH 27.0 06/29/2017 0712   MCHC 30.6 06/29/2017 0712   RDW 15.5 06/29/2017 0712   LYMPHSABS 2.3 06/26/2017 2131   MONOABS 1.0 06/26/2017 2131   EOSABS 0.4 06/26/2017 2131   BASOSABS 0.0 06/26/2017 2131   Iron/TIBC/Ferritin/ %Sat No results found for: IRON, TIBC, FERRITIN, IRONPCTSAT Lipid Panel  No results found for: CHOL, TRIG, HDL, CHOLHDL, VLDL, LDLCALC, LDLDIRECT Hepatic Function Panel     Component Value Date/Time   PROT 8.5 (H) 06/26/2017 2131   ALBUMIN 3.5 06/26/2017 2131   AST 26 06/26/2017  2131   ALT 18 06/26/2017 2131   ALKPHOS 122 06/26/2017 2131   BILITOT 0.8 06/26/2017 2131   No results found for: TSH   No results found for: Vitamin D, 25-Hydroxy  ECG  shows Sinus  Rhythm with a rate of 72 BPM. Low voltage in precordial leads. 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 Nonspecific T-abnormality. Abnormal.  INDIRECT CALORIMETER done today shows a VO2 of 267 and a REE of 1861.  Her calculated basal metabolic rate is [id number] thus her basal metabolic rate is worse than expected.  OBESITY BEHAVIORAL INTERVENTION VISIT  Today's visit was #1  Starting weight: 322 lbs Starting date: 01/13/2019 Today's weight: 322 lbs  Today's date: 01/13/2019 Total lbs lost to date: 0 At least 15 minutes were spent on discussing the following behavioral intervention visit.    01/13/2019  Height 5\' 2"  (1.575 m)  Weight 322 lb (146.1 kg) (A)  BMI (Calculated) 58.88  BLOOD PRESSURE - SYSTOLIC [id number]  BLOOD PRESSURE - DIASTOLIC 65  Waist Measurement  56 inches  RMR 1861   ASK: We discussed the diagnosis of obesity with Leontine Locket today and Jini agreed to give [name] permission to discuss obesity behavioral modification therapy today.  ASSESS: Reizel has  the diagnosis of obesity and her BMI today is 58.89. Bilen is in the action stage of change.   ADVISE: Shalinda was educated on the multiple health risks of obesity as well as the benefit of weight loss to improve her health. She was advised of the need for long term treatment and the importance of lifestyle modifications to improve her current health and to decrease her risk of future health problems.  AGREE: Multiple dietary modification options and treatment options were discussed and  Jona agreed to follow the recommendations documented in the above note.  ARRANGE: Aliyannah was educated on the importance of frequent visits to treat obesity as outlined per CMS and USPSTF guidelines and agreed to schedule her  next follow up appointment today.  IMichaelene Song, am acting as Location manager for [employer]. Juleen China, DO  I have reviewed the above documentation for accuracy and completeness, and I agree with the above. Briscoe Deutscher, DO

## 2019-01-14 LAB — COMPREHENSIVE METABOLIC PANEL
ALT: 17 IU/L (ref 0–32)
AST: 28 IU/L (ref 0–40)
Albumin/Globulin Ratio: 1 — ABNORMAL LOW (ref 1.2–2.2)
Albumin: 3.7 g/dL — ABNORMAL LOW (ref 3.8–4.8)
Alkaline Phosphatase: 166 IU/L — ABNORMAL HIGH (ref 39–117)
BUN/Creatinine Ratio: 16 (ref 12–28)
BUN: 15 mg/dL (ref 8–27)
Bilirubin Total: 0.6 mg/dL (ref 0.0–1.2)
CO2: 28 mmol/L (ref 20–29)
Calcium: 8.6 mg/dL — ABNORMAL LOW (ref 8.7–10.3)
Chloride: 98 mmol/L (ref 96–106)
Creatinine, Ser: 0.96 mg/dL (ref 0.57–1.00)
GFR calc Af Amer: 71 mL/min/{1.73_m2} (ref >59)
GFR calc non Af Amer: 62 mL/min/{1.73_m2} (ref >59)
Globulin, Total: 3.6 g/dL (ref 1.5–4.5)
Glucose: 86 mg/dL (ref 65–99)
Potassium: 4 mmol/L (ref 3.5–5.2)
Sodium: 139 mmol/L (ref 134–144)
Total Protein: 7.3 g/dL (ref 6.0–8.5)

## 2019-01-14 LAB — CBC WITH DIFFERENTIAL/PLATELET
Basophils Absolute: 0.1 10*3/uL (ref 0.0–0.2)
Basos: 1 %
EOS (ABSOLUTE): 0.3 10*3/uL (ref 0.0–0.4)
Eos: 3 %
Hematocrit: 39.4 % (ref 34.0–46.6)
Hemoglobin: 12.6 g/dL (ref 11.1–15.9)
Immature Grans (Abs): 0 10*3/uL (ref 0.0–0.1)
Immature Granulocytes: 0 %
Lymphocytes Absolute: 2 10*3/uL (ref 0.7–3.1)
Lymphs: 22 %
MCH: 26.5 pg — ABNORMAL LOW (ref 26.6–33.0)
MCHC: 32 g/dL (ref 31.5–35.7)
MCV: 83 fL (ref 79–97)
Monocytes Absolute: 0.8 10*3/uL (ref 0.1–0.9)
Monocytes: 9 %
Neutrophils Absolute: 5.8 10*3/uL (ref 1.4–7.0)
Neutrophils: 65 %
Platelets: 227 10*3/uL (ref 150–450)
RBC: 4.76 x10E6/uL (ref 3.77–5.28)
RDW: 13.6 % (ref 11.7–15.4)
WBC: 9 10*3/uL (ref 3.4–10.8)

## 2019-01-14 LAB — LIPID PANEL WITH LDL/HDL RATIO
Cholesterol, Total: 133 mg/dL (ref 100–199)
HDL: 39 mg/dL — ABNORMAL LOW (ref >39)
LDL Chol Calc (NIH): 79 mg/dL (ref 0–99)
LDL/HDL Ratio: 2 ratio (ref 0.0–3.2)
Triglycerides: 78 mg/dL (ref 0–149)
VLDL Cholesterol Cal: 15 mg/dL (ref 5–40)

## 2019-01-14 LAB — T3: T3, Total: 115 ng/dL (ref 71–180)

## 2019-01-14 LAB — INSULIN, RANDOM: INSULIN: 24.5 u[IU]/mL (ref 2.6–24.9)

## 2019-01-14 LAB — HEMOGLOBIN A1C
Est. average glucose Bld gHb Est-mCnc: 108 mg/dL
Hgb A1c MFr Bld: 5.4 % (ref 4.8–5.6)

## 2019-01-14 LAB — TSH: TSH: 6.75 u[IU]/mL — ABNORMAL HIGH (ref 0.450–4.500)

## 2019-01-14 LAB — T4, FREE: Free T4: 1.61 ng/dL (ref 0.82–1.77)

## 2019-01-14 LAB — VITAMIN D 25 HYDROXY (VIT D DEFICIENCY, FRACTURES): Vit D, 25-Hydroxy: 8.7 ng/mL — ABNORMAL LOW (ref 30.0–100.0)

## 2019-01-14 NOTE — Progress Notes (Unsigned)
Office: [phone]  /  Fax: [phone]    Date: January 19, 2019  Time Seen: *** Duration: *** minutes Provider: Glennie Isle, PsyD Type of Session: Intake for Individual Therapy  Type of Contact: Face-to-face  Informed Consent for In-Person Services During COVID-19: During today's appointment, information about the decision to initiate in-person services in light of the TDHRC-16 public health crisis was discussed. Maury and this provider agreed to meet in person for some or all future appointments. If there is a resurgence of the pandemic or other health concerns arise, telepsychological services may be initiated and any related concerns will be discussed and an attempt to address them will be made. Zohar verbally acknowledged understanding that if necessary, this provider may determine there is a need to initiate telepsychological services for everyone's well-being. Sarye expressed understanding she may request to initiate telepsychological services, and that request will be respected as long as it is feasible and clinically appropriate. Regarding telepsychological services, Kahleah acknowledged she is ultimately responsible for understanding her insurance benefits as it relates to reimbursement of telepsychological services. Moreover, the risks for opting for in-person services was discussed. Doria verbally acknowledged understanding that by coming to the office, she is assuming the risk of exposure to the coronavirus or other public risk, and the risk may increase if Niaja travels by public transportation, cab, or [employer]. To obtain in-person services, Sheily verbally agreed to taking certain precautions (e.g., screening prior to appointment; universal masking; social distancing of 6 feet; proper hand hygiene; no visitors) set forth by [facility] to keep everyone safe from exposure, sickness, and possible death. This information was *** shared by front  desk staff either at the time of scheduling and/or during the check-in process. Antonina expressed understanding that should she not adhere to these safeguards, it may result in starting/returning to a telepsychological service arrangement and/or the exploration of other options for treatment. Marylyn acknowledged understanding that [facility] will follow the protocol set forth by [facility] should a patient present with a fever or other symptoms or disclose recent exposure, which will include rescheduling the appointment. Furthermore, Joud acknowledged understanding that precautions may change if additional local, state or federal orders or guidelines are published. This provider also shared that if [facility] tests positive for the coronavirus, this provider may be required to notify local health authorities that Kieryn was in the [facility] clinic. Only minimum information necessary for data collection will be disclosed. This provider will follow [facility]'s disclosure policy should this provider or staff test positive for the coronavirus. To avoid handling of paper/writing instruments and increasing likelihood of touching, verbal consent was obtained by [facility] during today's appointment prior to proceeding. Oluchi provided verbal consent to proceed, and acknowledged understanding that by verbally consenting to proceed, she is agreeable to all information noted above.   Informed Consent: The provider's role was explained to [employer]. The provider reviewed and discussed issues of confidentiality, privacy, and limits therein (e.g., reporting obligations). In addition to verbal informed consent, written informed consent for psychological services was obtained from [facility] prior to the initial intake interview. Written consent included information concerning the practice, financial arrangements, and confidentiality and patients' rights. Since the clinic  is not a 24/7 crisis center, mental health emergency resources were shared, and the provider explained MyChart, e-mail, voicemail, and/or other messaging systems should be utilized only for non-emergency reasons. This provider also explained that information obtained during appointments will be placed in Atlas's  medical record in a confidential manner and relevant information will be shared with other providers at [facility] that she meets with for coordination of care. Armine verbally acknowledged understanding of the aforementioned, and agreed to use mental health emergency resources discussed if needed. Moreover, Tyrica agreed information may be shared with other [facility] providers as needed for coordination of care. By signing the service agreement document, Eyleen provided written consent for coordination of care.   Chief Complaint/HPI: Tessi was referred by Dr. Briscoe Deutscher due to depression with emotional eating behaviors. Per the note for the visit with Dr. Briscoe Deutscher on January 13, 2019, "Adalia is struggling with emotional eating and using food for comfort to the extent that it is negatively impacting her health. She often snacks when she is not hungry. Arden sometimes feels she is out of control and then feels guilty that she made poor food choices. She has been working on behavior modification techniques to help reduce her emotional eating and has been somewhat successful. She shows no sign of suicidal or homicidal ideations." During the initial appointment with Dr. Briscoe Deutscher at [facility] on January 13, 2019, Nicholle reported experiencing the following: significant food cravings issues , snacking frequently in the evenings, frequently making poor food choices, frequently eating larger portions than normal , binge eating behaviors, struggling with emotional eating, waking up frquently in the middle of the night to eat,  having problems with excessive hunger and skipping breakfast 3-4 times a week. Sondra's Food and Mood (modified PHQ-9) score was 24.   During today's appointment, Kameryn was verbally administered a questionnaire assessing various behaviors related to emotional eating. Crosby endorsed the following: {gbmoodandfood:21755}. She shared she craves ***. Megen believes the onset of emotional eating was *** and described the current frequency of emotional eating as ***. In addition, Genecis {gblegal:22371} a history of binge eating. *** Moreover, Jovee indicated *** triggers emotional eating, whereas *** makes emotional eating better. Furthermore, Eyonna {gblegal:22371} other problems of concern. ***   Mental Status Examination:  Appearance: {Appearance:22431} Behavior: {Behavior:22445} Mood: {gbmood:21757} Affect: {Affect:22436} Speech: {Speech:22432} Eye Contact: {Eye Contact:22433} Psychomotor Activity: {Motor Activity:22434} Thought Process: {thought process:22448}  Content/Perceptual Disturbances: {disturbances:22451} Orientation: {Orientation:22437} Cognition/Sensorium: {gbcognition:22449} Insight: {Insight:22446} Judgment: {Insight:22446}  Family & Psychosocial History: Ramsha reported she is ***. She indicated she is currently ***. Additionally, Bradley shared her highest level of education obtained is ***. Currently, Taraya's social support system consists of ***. Moreover, Vernie stated she resides with ***.   Medical History:  Past Medical History:  Diagnosis Date   Allergies    Anemia    Back pain    Bilateral swelling of feet    CHF (congestive heart failure) (HCC)    Chronic cough    COPD (chronic obstructive pulmonary disease) (HCC)    Depression    GERD (gastroesophageal reflux disease)    Heart murmur    Hypertension    Hypocapnia    Hypothyroid    Knee pain    Leg edema    chronic, bilateral   Lightheaded    Low back  pain    Morbid obesity (HCC)    Neck pain    Obesity hypoventilation syndrome (HCC)    Oral lesion    Shortness of breath    Shortness of breath    Shoulder pain    Snoring    Past Surgical History:  Procedure Laterality Date   APPENDECTOMY     CHOLECYSTECTOMY  COLONOSCOPY N/A 06/25/2013   Procedure: COLONOSCOPY;  Surgeon: [name], MD;  Location: [facility]COPY;  Service: Endoscopy;  Laterality: N/A;   ECTOPIC PREGNANCY SURGERY     Left Ovary Removed     MENISCUS REPAIR     TONSILLECTOMY     TUBAL LIGATION     Current Outpatient Medications on File Prior to Visit  Medication Sig Dispense Refill   albuterol (PROVENTIL HFA;VENTOLIN HFA) 108 (90 BASE) MCG/ACT inhaler Inhale 2 puffs into the lungs every 6 (six) hours as needed for wheezing.      aspirin EC 81 MG EC tablet Take 1 tablet (81 mg total) by mouth daily. 30 tablet 0   baclofen (LIORESAL) 10 MG tablet Take 1 tablet by mouth 4 (four) times daily as needed for muscle spasms.      buPROPion (WELLBUTRIN) 100 MG tablet Take 100 mg by mouth 2 (two) times daily.     celecoxib (CELEBREX) 200 MG capsule Take 200 mg by mouth daily.     citalopram (CELEXA) 20 MG tablet Take 1 tablet (20 mg total) by mouth daily. 30 tablet 0   diclofenac sodium (VOLTAREN) 1 % GEL Apply 1 application topically 4 (four) times daily. Applied to knees, shoulders, wrists, and ankles     diphenhydrAMINE (BENADRYL) 50 MG capsule Take 50 mg by mouth every 6 (six) hours as needed.     furosemide (LASIX) 40 MG tablet 2 tablets (15m) by mouth two times a day 120 tablet 1   levothyroxine (SYNTHROID, LEVOTHROID) 200 MCG tablet Take 200 mcg by mouth daily before breakfast.     metoprolol tartrate (LOPRESSOR) 25 MG tablet Take 0.5 tablets (12.5 mg total) by mouth 2 (two) times daily. 30 tablet 0   nicotine polacrilex (NICORETTE) 4 MG gum Take 4 mg by mouth as needed for smoking cessation.     oxyCODONE-acetaminophen  (PERCOCET/ROXICET) 5-325 MG tablet Take by mouth every 4 (four) hours as needed for severe pain.     potassium chloride SA (K-DUR,KLOR-CON) 20 MEQ tablet 2 tablets (40 mEq) by mouth in the AM and 1 tablet (20 mEq) by mouth in the PM 90 tablet 0   pregabalin (LYRICA) 100 MG capsule Take 1 capsule (100 mg total) by mouth 2 (two) times daily. (Patient taking differently: Take 50 mg by mouth 2 (two) times daily. ) 60 capsule 0   Spacer/Aero-Holding Chambers (AEROCHAMBER Z-STAT PLUS CHAMBR) MISC Use as directed 1 each 0   SPIRIVA HANDIHALER 18 MCG inhalation capsule PLACE 1 CAPSULE (18 MCG TOTAL) INTO INHALER AND INHALE DAILY. 30 capsule 6   SYMBICORT 160-4.5 MCG/ACT inhaler INHALE TWO puffs into lung TWICE DAILY 10.2 g 11   No current facility-administered medications on file prior to visit.    Mental Health History: RElleah{gblegal:22371} a history of therapeutic services. Hildagarde denied a history of hospitalizations for psychiatric concerns, and has never met with a psychiatrist.*** RDarlettestated she was *** psychotropic medications. Avanna {gblegal:22371} a family history of mental health related concerns. *** Sachi denied a trauma history, including {gbtrauma:22071} abuse, as well as neglect. ***  Shea described her typical mood as ***. Aside from concerns noted above and endorsed on the PHQ-9 and GAD-7, Kerrigan reported ***. Deriana {gblegal:22371} current alcohol use. *** She {gblegal:22371} tobacco use. *** She {{SUPJSRP:59458}illicit/recreational substance use. Regarding caffeine intake, Antoniette reported ***. Furthermore, Remmie denied experiencing the following: {gbsxs:21965}. She also denied history of and current suicidal ideation, plan, and intent; history of and current homicidal ideation,  plan, and intent; and history of and current engagement in self-harm.  The following strengths were reported by [facility]: ***. The following strengths were observed by this  provider: {gbstrengths:22223}.  Legal History: Jolana {gblegal:22371} a history of legal involvement.   Structured Assessment Results: The Patient Health Questionnaire-9 (PHQ-9) is a self-report measure that assesses symptoms and severity of depression over the course of the last two weeks. Kadian obtained a score of *** suggesting {GBPHQ9SEVERITY:21752}. Taydem finds the endorsed symptoms to be {gbphq9difficulty:21754}. Little interest or pleasure in doing things ***  Feeling down, depressed, or hopeless ***  Trouble falling or staying asleep, or sleeping too much ***  Feeling tired or having little energy ***  Poor appetite or overeating ***  Feeling bad about yourself --- or that you are a failure or have let yourself or your family down ***  Trouble concentrating on things, such as reading the newspaper or watching television ***  Moving or speaking so slowly that other people could have noticed? Or the opposite --- being so fidgety or restless that you have been moving around a lot more than usual ***  Thoughts that you would be better off dead or hurting yourself in some way ***  PHQ-9 Score ***    The Generalized Anxiety Disorder-7 (GAD-7) is a brief self-report measure that assesses symptoms of anxiety over the course of the last two weeks. Rainelle obtained a score of *** suggesting {gbgad7severity:21753}. Ellean finds the endorsed symptoms to be {gbphq9difficulty:21754}. Feeling nervous, anxious, on edge ***  Not being able to stop or control worrying ***  Worrying too much about different things ***  Trouble relaxing ***  Being so restless that it's hard to sit still ***  Becoming easily annoyed or irritable ***  Feeling afraid as if something awful might happen ***  GAD-7 Score ***   Interventions: A chart review was conducted prior to the clinical intake interview. The PHQ-9, and GAD-7 were verbally administered and a clinical intake interview was completed. In  addition, Atia was verbally administered a Mood and Food questionnaire to assess various behaviors related to emotional eating. Throughout session, empathic reflections and validation was provided. Continuing treatment with this provider was discussed and a treatment goal was established. Psychoeducation regarding emotional versus physical hunger was provided. Jonda was given a handout to utilize between now and the next appointment to increase awareness of hunger patterns and subsequent eating. ***  Provisional DSM-5 Diagnosis: {Diagnoses:22752}  Plan: Kamyla appears able and willing to participate as evidenced by collaboration on a treatment goal, engagement in reciprocal conversation, and asking questions as needed for clarification. The next appointment will be scheduled in {gbweeks:21758}. The following treatment goal was established: {gbtxgoals:21759}. For the aforementioned goal, Pearla can benefit from biweekly individual therapy sessions that are brief in duration for approximately four to six sessions. The treatment modality will be individual therapeutic services, including an eclectic therapeutic approach utilizing techniques from Cognitive Behavioral Therapy, Patient Centered Therapy, Dialectical Behavior Therapy, Acceptance and Commitment Therapy, Interpersonal Therapy, and Cognitive Restructuring. Therapeutic approach will include various interventions as appropriate, such as validation, support, mindfulness, thought defusion, reframing, psychoeducation, values assessment, and role playing. This provider will regularly review the treatment plan and medical chart to keep informed of status changes. Yaniah expressed understanding and agreement with the initial treatment plan of care.

## 2019-01-15 LAB — ANEMIA PANEL
Ferritin: 36 ng/mL (ref 15–150)
Folate, Hemolysate: 327 ng/mL
Folate, RBC: 834 ng/mL (ref >498)
Hematocrit: 39.2 % (ref 34.0–46.6)
Iron Saturation: 16 % (ref 15–55)
Iron: 63 ug/dL (ref 27–139)
Retic Ct Pct: 1.5 % (ref 0.6–2.6)
Total Iron Binding Capacity: 383 ug/dL (ref 250–450)
UIBC: 320 ug/dL (ref 118–369)
Vitamin B-12: 466 pg/mL (ref 232–1245)

## 2019-01-18 ENCOUNTER — Ambulatory Visit (INDEPENDENT_AMBULATORY_CARE_PROVIDER_SITE_OTHER): Payer: Medicare Other | Admitting: Psychology

## 2019-01-18 ENCOUNTER — Encounter (INDEPENDENT_AMBULATORY_CARE_PROVIDER_SITE_OTHER): Payer: Self-pay | Admitting: Family Medicine

## 2019-01-18 ENCOUNTER — Encounter (INDEPENDENT_AMBULATORY_CARE_PROVIDER_SITE_OTHER): Payer: Self-pay

## 2019-01-19 ENCOUNTER — Ambulatory Visit (INDEPENDENT_AMBULATORY_CARE_PROVIDER_SITE_OTHER): Payer: Medicare Other | Admitting: Psychology

## 2019-01-19 ENCOUNTER — Encounter (HOSPITAL_COMMUNITY): Payer: Self-pay | Admitting: Family Medicine

## 2019-01-27 ENCOUNTER — Telehealth (HOSPITAL_COMMUNITY): Payer: Self-pay

## 2019-01-27 ENCOUNTER — Ambulatory Visit (INDEPENDENT_AMBULATORY_CARE_PROVIDER_SITE_OTHER): Payer: Medicare Other | Admitting: Family Medicine

## 2019-01-27 ENCOUNTER — Encounter (INDEPENDENT_AMBULATORY_CARE_PROVIDER_SITE_OTHER): Payer: Self-pay | Admitting: Family Medicine

## 2019-01-27 ENCOUNTER — Other Ambulatory Visit: Payer: Self-pay

## 2019-01-27 VITALS — BP 148/65 | HR 81 | Temp 98.6°F | Ht 62.0 in | Wt 312.0 lb

## 2019-01-27 DIAGNOSIS — E559 Vitamin D deficiency, unspecified: Secondary | ICD-10-CM | POA: Diagnosis not present

## 2019-01-27 DIAGNOSIS — Z6841 Body Mass Index (BMI) 40.0 and over, adult: Secondary | ICD-10-CM | POA: Diagnosis not present

## 2019-01-27 DIAGNOSIS — E8881 Metabolic syndrome: Secondary | ICD-10-CM | POA: Diagnosis not present

## 2019-01-27 DIAGNOSIS — J449 Chronic obstructive pulmonary disease, unspecified: Secondary | ICD-10-CM

## 2019-01-27 DIAGNOSIS — E038 Other specified hypothyroidism: Secondary | ICD-10-CM | POA: Diagnosis not present

## 2019-01-27 MED ORDER — VITAMIN D (ERGOCALCIFEROL) 1.25 MG (50000 UNIT) PO CAPS: 50000.0000 [IU] | ORAL_CAPSULE | ORAL | 0 refills | Status: DC

## 2019-01-27 MED ORDER — LEVOTHYROXINE SODIUM 25 MCG PO TABS: ORAL_TABLET | ORAL | 0 refills | Status: DC

## 2019-01-27 NOTE — Telephone Encounter (Signed)
New message     Just an FYI. We have made several attempts to contact this patient including sending a letter to schedule or reschedule their echocardiogram. We will be removing the patient from the echo WQ.   11.18.20 @ 11:17am both # are the same  - lm on home vm - Sharion Grieves  11.10.20 @ 12:27pm lm on home vm  - mail reminder letter - My Rinke  11.4.20 @ 2:57pm both # are the same  - lm on home vm - gesial
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
## 2019-02-01 ENCOUNTER — Encounter (INDEPENDENT_AMBULATORY_CARE_PROVIDER_SITE_OTHER): Payer: Self-pay | Admitting: Family Medicine

## 2019-02-01 NOTE — Progress Notes (Signed)
Office: [phone]  /  Fax: [phone]   HPI:   Chief Complaint: Morgan Odonnell Morgan Odonnell is here to discuss her progress with her Morgan Odonnell treatment plan. She is on the Category 2 plan and is following her eating plan approximately 80-85 % of the time. She states she is exercising 0 minutes 0 times per week. Morgan Odonnell is doing well and able to tolerate diet. She denies hunger.  Her weight is (!) 312 lb (141.5 kg) today and has had a weight loss of 10 pounds over a period of 2 weeks since her last visit. She has lost 10 lbs since starting treatment with [name].  COPD Morgan Odonnell has a diagnosis of COPD. She has chronic respiratory failure, and she is on 3LNC. She notes cough last week, and endures seasonal allergies. She denies any sick exposures as she is always home. She notes improvement today.  Vitamin D Deficiency Morgan Odonnell has a diagnosis of vitamin D deficiency. She is not currently taking Vit D and denies nausea, vomiting or muscle weakness.  Hypothyroidism Morgan Odonnell has a diagnosis of hypothyroidism, undertreated. She is taking levothyroxine 200 mcg daily. She states she takes it with water only and hours prior to food or other medications. She denies hot or cold intolerance or palpitations.  Insulin Resistance Morgan Odonnell has a diagnosis of insulin resistance based on her elevated fasting insulin level >5. Although Morgan Odonnell's blood glucose readings are still under good control, insulin resistance puts her at greater risk of metabolic syndrome and diabetes. She is not taking metformin currently and continues to work on diet and exercise to decrease risk of diabetes.  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 She states she missed calls from imaging O[facility], but will call them now.   Vitamin D Deficiency Morgan Odonnell was informed that low vitamin D levels contributes to fatigue and are associated with Morgan Odonnell, breast, and colon cancer. Morgan Odonnell agrees to start prescription Vit D 50,000 IU every week #4 with no refills. She will follow up for routine testing of vitamin D, at least 2-3 times per year. She was informed of the risk of over-replacement of vitamin D and agrees to not increase her dose unless she discusses this with [name] first. Morgan Odonnell agrees to follow up with our clinic in 2 weeks.  Hypothyroidism Morgan Odonnell was informed of the importance of good thyroid control to help with weight loss efforts. She was also informed that supertheraputic thyroid levels are dangerous and will not improve weight loss results. Morgan Odonnell agrees to continue taking levothyroxine 200 mcg daily, and will add levothyroxine 25 mcg PO on Monday, Wednesday, and Friday #30 with no refills. We will recheck labs in 6 weeks. Morgan Odonnell agrees to follow up with our clinic in 2 weeks.   Insulin Resistance Morgan Odonnell will continue to work on weight loss, exercise, and decreasing simple carbohydrates in her diet to help decrease the risk of diabetes. We dicussed metformin including benefits and risks. She was informed that eating too many simple carbohydrates or too many calories at one sitting increases the likelihood of GI side effects. Morgan Odonnell will consider metformin in the future. Morgan Odonnell agrees to follow  up with [name] as directed to monitor her progress.  Morgan Odonnell Morgan Odonnell is currently in the action stage of change. As such, her goal is to continue with weight loss efforts She has agreed to follow the Category 2 plan Morgan Odonnell has been instructed to work up to a goal of 150 minutes of  combined cardio and strengthening exercise per week for weight loss and overall health benefits. We discussed the following Behavioral Modification Strategies today: increasing lean protein intake, decreasing simple carbohydrates, increasing vegetables, increase H20 intake, work on meal planning and easy cooking plans, and holiday eating strategies    Morgan Odonnell has agreed to follow up with our clinic in 2 weeks. She was informed of the importance of frequent follow up visits to maximize her success with intensive lifestyle modifications for her multiple health conditions.  ALLERGIES: Allergies  Allergen Reactions  . Gabapentin     "slept for 2 days"  . Novocain [Procaine] Other (See Comments)    intolerance    MEDICATIONS: Current Outpatient Medications on File Prior to Visit  Medication Sig Dispense Refill  . albuterol (PROVENTIL HFA;VENTOLIN HFA) 108 (90 BASE) MCG/ACT inhaler Inhale 2 puffs into the lungs every 6 (six) hours as needed for wheezing.     Marland Kitchen aspirin EC 81 MG EC tablet Take 1 tablet (81 mg total) by mouth daily. 30 tablet 0  . baclofen (LIORESAL) 10 MG tablet Take 1 tablet by mouth 4 (four) times daily as needed for muscle spasms.     Marland Kitchen buPROPion (WELLBUTRIN) 100 MG tablet Take 100 mg by mouth 2 (two) times daily.    . celecoxib (CELEBREX) 200 MG capsule Take 200 mg by mouth daily.    . citalopram (CELEXA) 20 MG tablet Take 1 tablet (20 mg total) by mouth daily. 30 tablet 0  . diclofenac sodium (VOLTAREN) 1 % GEL Apply 1 application topically 4 (four) times daily. Applied to knees, shoulders, wrists, and ankles    . diphenhydrAMINE (BENADRYL) 50 MG capsule Take 50 mg by mouth every 6 (six) hours as needed.    . furosemide (LASIX) 40 MG tablet 2 tablets (80mg ) by mouth two times a day 120 tablet 1  . levothyroxine (SYNTHROID, LEVOTHROID) 200 MCG tablet Take 200 mcg by mouth daily before breakfast.    . metoprolol tartrate (LOPRESSOR) 25 MG tablet Take 0.5 tablets (12.5 mg  total) by mouth 2 (two) times daily. 30 tablet 0  . nicotine polacrilex (NICORETTE) 4 MG gum Take 4 mg by mouth as needed for smoking cessation.    Marland Kitchen oxyCODONE-acetaminophen (PERCOCET/ROXICET) 5-325 MG tablet Take by mouth every 4 (four) hours as needed for severe pain.    . potassium chloride SA (K-DUR,KLOR-CON) 20 MEQ tablet 2 tablets (40 mEq) by mouth in the AM and 1 tablet (20 mEq) by mouth in the PM 90 tablet 0  . pregabalin (LYRICA) 100 MG capsule Take 1 capsule (100 mg total) by mouth 2 (two) times daily. (Patient taking differently: Take 50 mg by mouth 2 (two) times daily. ) 60 capsule 0  . Spacer/Aero-Holding Chambers (AEROCHAMBER Z-STAT PLUS CHAMBR) MISC Use as directed 1 each 0  . SPIRIVA HANDIHALER 18 MCG inhalation capsule PLACE 1 CAPSULE (18 MCG TOTAL) INTO INHALER AND INHALE DAILY. 30 capsule 6  . SYMBICORT 160-4.5 MCG/ACT inhaler INHALE TWO puffs into lung TWICE DAILY 10.2 g 11   No current facility-administered medications on file prior to visit.     PAST MEDICAL HISTORY: Past Medical History:  Diagnosis Date  .  Allergies   . Anemia   . Back pain   . Bilateral swelling of feet   . CHF (congestive heart failure) ([place])   . Chronic cough   . COPD (chronic obstructive pulmonary disease) ([place])   . Depression   . GERD (gastroesophageal reflux disease)   . Heart murmur   . Hypertension   . Hypocapnia   . Hypothyroid   . Knee pain   . Leg edema    chronic, bilateral  . Lightheaded   . Low back pain   . Morbid Morgan Odonnell ([place])   . Neck pain   . 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 Oral lesion   . Shortness of breath   . Shortness of breath   . Shoulder pain   . Snoring     PAST SURGICAL HISTORY: Past Surgical History:  Procedure Laterality Date  . APPENDECTOMY    . CHOLECYSTECTOMY    . COLONOSCOPY N/A 06/25/2013   Procedure: COLONOSCOPY;  Surgeon: [name], MD;  Location: [facility];  Service: Endoscopy;  Laterality: N/A;  . ECTOPIC PREGNANCY  SURGERY    . Left Ovary Removed    . MENISCUS REPAIR    . TONSILLECTOMY    . TUBAL LIGATION      SOCIAL HISTORY: Social History   Tobacco Use  . Smoking status: Former Smoker    Packs/day: 2.00    Years: 45.00    Pack years: 90.00    Types: Cigarettes    Quit date: 04/12/2015    Years since quitting: 3.8  . Smokeless tobacco: Never Used  . Tobacco comment: still chews nicotine gum  Substance Use Topics  . Alcohol use: No    Alcohol/week: 0.0 standard drinks  . Drug use: No    FAMILY HISTORY: Family History  Problem Relation Age of Onset  . Ovarian cancer Mother   . Lung disease Mother   . Heart failure Mother   . Hypertension Mother   . Heart disease Mother   . Seizures Mother   . Cancer Mother   . Depression Mother   . Sleep apnea Mother   . Alcoholism Mother   . Eating disorder Mother   . Morgan Odonnell Mother   . Hypertension Father   . Eating disorder Father   . Morgan Odonnell Father   . Anxiety disorder Father   . Lymphoma Brother   . Ovarian cancer Maternal Aunt   . Asthma Daughter     ROS: Review of Systems  Constitutional: Positive for weight loss.  Cardiovascular: Negative for palpitations.  Gastrointestinal: Negative for nausea and vomiting.  Musculoskeletal:       Negative muscle weakness  Endo/Heme/Allergies:       Negative hot/cold intolerance    PHYSICAL EXAM: Blood pressure (!) 148/65, pulse 81, temperature 98.6 F (37 C), temperature source Oral, height 5\' 2"  (1.575 m), weight (!) 312 lb (141.5 kg), SpO2 96 %. Body mass index is 57.07 kg/m. Physical Exam Vitals signs reviewed.  Constitutional:      Appearance: Normal appearance. She is obese.  Cardiovascular:     Rate and Rhythm: Normal rate.     Pulses: Normal pulses.  Pulmonary:     Effort: Pulmonary effort is normal.     Breath sounds: Normal breath sounds.  Musculoskeletal: Normal range of motion.  Skin:    General: Skin is warm and dry.  Neurological:     Mental Status: She is alert  and oriented to person, place, and time.  Psychiatric:  Mood and Affect: Mood normal.        Behavior: Behavior normal.     RECENT LABS AND TESTS: BMET    Component Value Date/Time   NA 139 01/13/2019 1646   K 4.0 01/13/2019 1646   CL 98 01/13/2019 1646   CO2 28 01/13/2019 1646   GLUCOSE 86 01/13/2019 1646   GLUCOSE 92 07/04/2017 0600   BUN 15 01/13/2019 1646   CREATININE 0.96 01/13/2019 1646   CALCIUM 8.6 (L) 01/13/2019 1646   GFRNONAA 62 01/13/2019 1646   GFRAA 71 01/13/2019 1646   Lab Results  Component Value Date   HGBA1C 5.4 01/13/2019   Lab Results  Component Value Date   INSULIN 24.5 01/13/2019   CBC    Component Value Date/Time   WBC 9.0 01/13/2019 1646   WBC 9.9 06/29/2017 0712   RBC 4.76 01/13/2019 1646   RBC 4.66 06/29/2017 0712   HGB 12.6 01/13/2019 1646   HCT 39.4 01/13/2019 1646   PLT 227 01/13/2019 1646   MCV 83 01/13/2019 1646   MCH 26.5 (L) 01/13/2019 1646   MCH 27.0 06/29/2017 0712   MCHC 32.0 01/13/2019 1646   MCHC 30.6 06/29/2017 0712   RDW 13.6 01/13/2019 1646   LYMPHSABS 2.0 01/13/2019 1646   MONOABS 1.0 06/26/2017 2131   EOSABS 0.3 01/13/2019 1646   BASOSABS 0.1 01/13/2019 1646   Iron/TIBC/Ferritin/ %Sat    Component Value Date/Time   IRON 63 01/13/2019 1250   TIBC 383 01/13/2019 1250   FERRITIN 36 01/13/2019 1250   IRONPCTSAT 16 01/13/2019 1250   Lipid Panel     Component Value Date/Time   CHOL 133 01/13/2019 1646   TRIG 78 01/13/2019 1646   HDL 39 (L) 01/13/2019 1646   LDLCALC 79 01/13/2019 1646   Hepatic Function Panel     Component Value Date/Time   PROT 7.3 01/13/2019 1646   ALBUMIN 3.7 (L) 01/13/2019 1646   AST 28 01/13/2019 1646   ALT 17 01/13/2019 1646   ALKPHOS 166 (H) 01/13/2019 1646   BILITOT 0.6 01/13/2019 1646      Component Value Date/Time   TSH 6.750 (H) 01/13/2019 1646      Morgan Odonnell BEHAVIORAL INTERVENTION VISIT  Today's visit was # 2   Starting weight: 322 lbs Starting date:  01/13/2019 Today's weight : 312 lbs Today's date: 01/27/2019 Total lbs lost to date: 10 At least 15 minutes were spent on discussing the following behavioral intervention visit.   ASK: We discussed the diagnosis of Morgan Odonnell with Morgan Odonnell today and Morgan Odonnell agreed to give [name] permission to discuss Morgan Odonnell behavioral modification therapy today.  ASSESS: Morgan Odonnell has the diagnosis of Morgan Odonnell and her BMI today is 57.05 [name] is in the action stage of change   ADVISE: Cyndle was educated on the multiple health risks of Morgan Odonnell as well as the benefit of weight loss to improve her health. She was advised of the need for long term treatment and the importance of lifestyle modifications to improve her current health and to decrease her risk of future health problems.  AGREE: Multiple dietary modification options and treatment options were discussed and  Marifer agreed to follow the recommendations documented in the above note.  ARRANGE: Caresa was educated on the importance of frequent visits to treat Morgan Odonnell as outlined per CMS and USPSTF guidelines and agreed to schedule her next follow up appointment today.  Wilhemena Durie, am acting as transcriptionist for Briscoe Deutscher, DO  I have reviewed the above  documentation for accuracy and completeness, and I agree with the above. Briscoe Deutscher, DO

## 2019-02-09 ENCOUNTER — Other Ambulatory Visit: Payer: Self-pay

## 2019-02-09 ENCOUNTER — Ambulatory Visit (INDEPENDENT_AMBULATORY_CARE_PROVIDER_SITE_OTHER): Payer: Medicare Other | Admitting: Psychology

## 2019-02-09 DIAGNOSIS — F3289 Other specified depressive episodes: Secondary | ICD-10-CM

## 2019-02-10 ENCOUNTER — Encounter (INDEPENDENT_AMBULATORY_CARE_PROVIDER_SITE_OTHER): Payer: Self-pay | Admitting: Family Medicine

## 2019-02-10 ENCOUNTER — Other Ambulatory Visit: Payer: Self-pay

## 2019-02-10 ENCOUNTER — Ambulatory Visit (INDEPENDENT_AMBULATORY_CARE_PROVIDER_SITE_OTHER): Payer: Medicare Other | Admitting: Family Medicine

## 2019-02-10 VITALS — BP 126/73 | HR 78 | Temp 98.3°F | Ht 62.0 in | Wt 312.0 lb

## 2019-02-10 DIAGNOSIS — E038 Other specified hypothyroidism: Secondary | ICD-10-CM

## 2019-02-10 DIAGNOSIS — R609 Edema, unspecified: Secondary | ICD-10-CM | POA: Diagnosis not present

## 2019-02-10 DIAGNOSIS — K08109 Complete loss of teeth, unspecified cause, unspecified class: Secondary | ICD-10-CM

## 2019-02-10 DIAGNOSIS — E8881 Metabolic syndrome: Secondary | ICD-10-CM | POA: Diagnosis not present

## 2019-02-10 DIAGNOSIS — E88819 Insulin resistance, unspecified: Secondary | ICD-10-CM

## 2019-02-10 DIAGNOSIS — Z6841 Body Mass Index (BMI) 40.0 and over, adult: Secondary | ICD-10-CM | POA: Diagnosis not present

## 2019-02-10 DIAGNOSIS — E559 Vitamin D deficiency, unspecified: Secondary | ICD-10-CM | POA: Diagnosis not present

## 2019-02-10 MED ORDER — VITAMIN D (ERGOCALCIFEROL) 1.25 MG (50000 UNIT) PO CAPS: 50000.0000 [IU] | ORAL_CAPSULE | ORAL | 0 refills | Status: DC

## 2019-02-10 NOTE — Progress Notes (Signed)
Office: [phone]  /  Fax: [phone]   HPI:   Chief Complaint: OBESITY Morgan Odonnell is here to discuss her progress with her obesity treatment plan. She is on the Category 2 plan and is following her eating plan approximately 80 % of the time. She states she is exercising 0 minutes 0 times per week. Morgan Odonnell increased salt intake during the holiday. She suspects water gain. Her have increased edema and she notes stasis dermatitis.  Her weight is (!) 312 lb (141.5 kg) today and has not lost weight since her last visit. She has lost 10 lbs since starting treatment with [name].  Hypothyroidism Morgan Odonnell has a diagnosis of hypothyroidism. She has increased levothyroxine by adding 25 mcg PO Monday, Wednesday, and Friday. She denies hot or cold intolerance or palpitations.  Insulin Resistance Morgan Odonnell has a diagnosis of insulin resistance based on her elevated fasting insulin level >5. Although Morgan Odonnell's blood glucose readings are still under good control, insulin resistance puts her at greater risk of metabolic syndrome and diabetes. She is not taking metformin currently and continues to work on diet and exercise to decrease risk of diabetes.  Vitamin D Deficiency Morgan Odonnell has a diagnosis of vitamin D deficiency. She is currently taking prescription Vit D and denies nausea, vomiting or muscle weakness.  Edema Morgan Odonnell notes increased salt, and has not been taking 2 tablets of Lasix in the morning as prescribed. She will go ahead and restart.  Morgan Odonnell Morgan Odonnell does not have teeth. She has dentures but does not like them.   ASSESSMENT AND PLAN:  Other specified hypothyroidism  Insulin resistance  Vitamin D deficiency - Plan: Vitamin D, Ergocalciferol, (DRISDOL) 1.25 MG (50000 UT) CAPS capsule  Edema, unspecified type  Morgan Odonnell  Class 3 severe obesity with serious comorbidity and body mass index (BMI) of 50.0 to 59.9 in adult, unspecified obesity type  (Morgan Odonnell)  PLAN:  Hypothyroidism Morgan Odonnell was informed of the importance of good thyroid control to help with weight loss efforts. She was also informed that supertheraputic thyroid levels are dangerous and will not improve weight loss results. We will recheck labs in 4 weeks.  Insulin Resistance Morgan Odonnell will continue to work on weight loss, exercise, and decreasing simple carbohydrates in her diet to help decrease the risk of diabetes. We dicussed metformin including benefits and risks. She was informed that eating too many simple carbohydrates or too many calories at one sitting increases the likelihood of GI side effects. Morgan Odonnell agrees to follow up with [name] as directed to monitor her progress.  Vitamin D Deficiency Morgan Odonnell was informed that low vitamin D levels contributes to fatigue and are associated with obesity, breast, and colon cancer. Morgan Odonnell agrees to continue taking prescription Vit D 50,000 IU every week #4 and we will refill for 1 month. She will follow up for routine testing of vitamin D, at least 2-3 times per year. She was informed of the risk of over-replacement of vitamin D and agrees to not increase her dose unless she discusses this with [name] first. Morgan Odonnell agrees to follow up with our clinic in 2 weeks.  Edema Morgan Odonnell will restart her Lasix, and we will continue to monitor.  Morgan Odonnell Morgan Odonnell is ok to have more soft, protein rich foods.  Obesity Morgan Odonnell is currently in the action stage of change. As such, her goal is to continue with weight loss efforts She has agreed to follow the Category 2 plan Morgan Odonnell has been instructed to work up to a goal of 150 minutes of combined  cardio and strengthening exercise per week or as tolerated for weight loss and overall health benefits. We discussed the following Behavioral Modification Strategies today: increasing lean protein intake, decreasing simple carbohydrates, increasing vegetables, increase H20 intake, work on  meal planning and easy cooking plans and holiday eating strategies    Morgan Odonnell has agreed to follow up with our clinic in 2 weeks. She was informed of the importance of frequent follow up visits to maximize her success with intensive lifestyle modifications for her multiple health conditions.  ALLERGIES: Allergies  Allergen Reactions  . Gabapentin     "slept for 2 days"  . Novocain [Procaine] Other (See Comments)    intolerance    MEDICATIONS: Current Outpatient Medications on File Prior to Visit  Medication Sig Dispense Refill  . albuterol (PROVENTIL HFA;VENTOLIN HFA) 108 (90 BASE) MCG/ACT inhaler Inhale 2 puffs into the lungs every 6 (six) hours as needed for wheezing.     Marland Kitchen aspirin EC 81 MG EC tablet Take 1 tablet (81 mg total) by mouth daily. 30 tablet 0  . baclofen (LIORESAL) 10 MG tablet Take 1 tablet by mouth 4 (four) times daily as needed for muscle spasms.     Marland Kitchen buPROPion (WELLBUTRIN) 100 MG tablet Take 100 mg by mouth 2 (two) times daily.    . celecoxib (CELEBREX) 200 MG capsule Take 200 mg by mouth daily.    . citalopram (CELEXA) 20 MG tablet Take 1 tablet (20 mg total) by mouth daily. 30 tablet 0  . diclofenac sodium (VOLTAREN) 1 % GEL Apply 1 application topically 4 (four) times daily. Applied to knees, shoulders, wrists, and ankles    . diphenhydrAMINE (BENADRYL) 50 MG capsule Take 50 mg by mouth every 6 (six) hours as needed.    . furosemide (LASIX) 40 MG tablet 2 tablets (80mg ) by mouth two times a day 120 tablet 1  . levothyroxine (SYNTHROID) 25 MCG tablet Take 1 tab WITH LEVOTHYROXINE 200 MCG on Monday, Wednesday, and Friday. 30 tablet 0  . levothyroxine (SYNTHROID, LEVOTHROID) 200 MCG tablet Take 200 mcg by mouth daily before breakfast.    . metoprolol tartrate (LOPRESSOR) 25 MG tablet Take 0.5 tablets (12.5 mg total) by mouth 2 (two) times daily. 30 tablet 0  . nicotine polacrilex (NICORETTE) 4 MG gum Take 4 mg by mouth as needed for smoking cessation.    Marland Kitchen  oxyCODONE-acetaminophen (PERCOCET/ROXICET) 5-325 MG tablet Take by mouth every 4 (four) hours as needed for severe pain.    . potassium chloride SA (K-DUR,KLOR-CON) 20 MEQ tablet 2 tablets (40 mEq) by mouth in the AM and 1 tablet (20 mEq) by mouth in the PM 90 tablet 0  . pregabalin (LYRICA) 100 MG capsule Take 1 capsule (100 mg total) by mouth 2 (two) times daily. (Patient taking differently: Take 50 mg by mouth 2 (two) times daily. ) 60 capsule 0  . Spacer/Aero-Holding Chambers (AEROCHAMBER Z-STAT PLUS CHAMBR) MISC Use as directed 1 each 0  . SPIRIVA HANDIHALER 18 MCG inhalation capsule PLACE 1 CAPSULE (18 MCG TOTAL) INTO INHALER AND INHALE DAILY. 30 capsule 6  . SYMBICORT 160-4.5 MCG/ACT inhaler INHALE TWO puffs into lung TWICE DAILY 10.2 g 11   No current facility-administered medications on file prior to visit.     PAST MEDICAL HISTORY: Past Medical History:  Diagnosis Date  . Allergies   . Anemia   . Back pain   . Bilateral swelling of feet   . CHF (congestive heart failure) ([place])   . Chronic  cough   . COPD (chronic obstructive pulmonary disease) ([place])   . Depression   . GERD (gastroesophageal reflux disease)   . Heart murmur   . Hypertension   . Hypocapnia   . Hypothyroid   . Knee pain   . Leg edema    chronic, bilateral  . Lightheaded   . Low back pain   . Morbid obesity ([place])   . Neck pain   . Obesity hypoventilation syndrome ([place])   . Oral lesion   . Shortness of breath   . Shortness of breath   . Shoulder pain   . Snoring     PAST SURGICAL HISTORY: Past Surgical History:  Procedure Laterality Date  . APPENDECTOMY    . CHOLECYSTECTOMY    . COLONOSCOPY N/A 06/25/2013   Procedure: COLONOSCOPY;  Surgeon: [name], MD;  Location: [facility];  Service: Endoscopy;  Laterality: N/A;  . ECTOPIC PREGNANCY SURGERY    . Left Ovary Removed    . MENISCUS REPAIR    . TONSILLECTOMY    . TUBAL LIGATION      SOCIAL HISTORY: Social History   Tobacco Use  .  Smoking status: Former Smoker    Packs/day: 2.00    Years: 45.00    Pack years: 90.00    Types: Cigarettes    Quit date: 04/12/2015    Years since quitting: 3.8  . Smokeless tobacco: Never Used  . Tobacco comment: still chews nicotine gum  Substance Use Topics  . Alcohol use: No    Alcohol/week: 0.0 standard drinks  . Drug use: No    FAMILY HISTORY: Family History  Problem Relation Age of Onset  . Ovarian cancer Mother   . Lung disease Mother   . Heart failure Mother   . Hypertension Mother   . Heart disease Mother   . Seizures Mother   . Cancer Mother   . Depression Mother   . Sleep apnea Mother   . Alcoholism Mother   . Eating disorder Mother   . Obesity Mother   . Hypertension Father   . Eating disorder Father   . Obesity Father   . Anxiety disorder Father   . Lymphoma Brother   . Ovarian cancer Maternal Aunt   . Asthma Daughter     ROS: Review of Systems  Constitutional: Negative for weight loss.  Cardiovascular: Positive for leg swelling. Negative for palpitations.  Gastrointestinal: Negative for nausea and vomiting.  Musculoskeletal:       Negative muscle weakness  Endo/Heme/Allergies:       Negative hot/cold intolerance    PHYSICAL EXAM: Blood pressure 126/73, pulse 78, temperature 98.3 F (36.8 C), temperature source Oral, height 5\' 2"  (1.575 m), weight (!) 312 lb (141.5 kg), SpO2 93 %. Body mass index is 57.07 kg/m. Physical Exam Vitals signs reviewed.  Constitutional:      Appearance: Normal appearance. She is obese.  Cardiovascular:     Rate and Rhythm: Normal rate.     Pulses: Normal pulses.  Pulmonary:     Effort: Pulmonary effort is normal.     Breath sounds: Normal breath sounds.  Musculoskeletal: Normal range of motion.  Skin:    General: Skin is warm and dry.  Neurological:     Mental Status: She is alert and oriented to person, place, and time.  Psychiatric:        Mood and Affect: Mood normal.        Behavior: Behavior normal.  RECENT LABS AND TESTS: BMET    Component Value Date/Time   NA 139 01/13/2019 1646   K 4.0 01/13/2019 1646   CL 98 01/13/2019 1646   CO2 28 01/13/2019 1646   GLUCOSE 86 01/13/2019 1646   GLUCOSE 92 07/04/2017 0600   BUN 15 01/13/2019 1646   CREATININE 0.96 01/13/2019 1646   CALCIUM 8.6 (L) 01/13/2019 1646   GFRNONAA 62 01/13/2019 1646   GFRAA 71 01/13/2019 1646   Lab Results  Component Value Date   HGBA1C 5.4 01/13/2019   Lab Results  Component Value Date   INSULIN 24.5 01/13/2019   CBC    Component Value Date/Time   WBC 9.0 01/13/2019 1646   WBC 9.9 06/29/2017 0712   RBC 4.76 01/13/2019 1646   RBC 4.66 06/29/2017 0712   HGB 12.6 01/13/2019 1646   HCT 39.4 01/13/2019 1646   PLT 227 01/13/2019 1646   MCV 83 01/13/2019 1646   MCH 26.5 (L) 01/13/2019 1646   MCH 27.0 06/29/2017 0712   MCHC 32.0 01/13/2019 1646   MCHC 30.6 06/29/2017 0712   RDW 13.6 01/13/2019 1646   LYMPHSABS 2.0 01/13/2019 1646   MONOABS 1.0 06/26/2017 2131   EOSABS 0.3 01/13/2019 1646   BASOSABS 0.1 01/13/2019 1646   Iron/TIBC/Ferritin/ %Sat    Component Value Date/Time   IRON 63 01/13/2019 1250   TIBC 383 01/13/2019 1250   FERRITIN 36 01/13/2019 1250   IRONPCTSAT 16 01/13/2019 1250   Lipid Panel     Component Value Date/Time   CHOL 133 01/13/2019 1646   TRIG 78 01/13/2019 1646   HDL 39 (L) 01/13/2019 1646   LDLCALC 79 01/13/2019 1646   Hepatic Function Panel     Component Value Date/Time   PROT 7.3 01/13/2019 1646   ALBUMIN 3.7 (L) 01/13/2019 1646   AST 28 01/13/2019 1646   ALT 17 01/13/2019 1646   ALKPHOS 166 (H) 01/13/2019 1646   BILITOT 0.6 01/13/2019 1646      Component Value Date/Time   TSH 6.750 (H) 01/13/2019 1646      OBESITY BEHAVIORAL INTERVENTION VISIT  Today's visit was # 3   Starting weight: 322 lbs Starting date: 01/13/2019 Today's weight : 312 lbs  Today's date: 02/10/2019 Total lbs lost to date: 10 At least 15 minutes were spent on discussing  the following behavioral intervention visit.   ASK: We discussed the diagnosis of obesity with Leontine Locket today and Monyette agreed to give [name] permission to discuss obesity behavioral modification therapy today.  ASSESS: Arria has the diagnosis of obesity and her BMI today is 57.05 Marlie is in the action stage of change   ADVISE: Angelynne was educated on the multiple health risks of obesity as well as the benefit of weight loss to improve her health. She was advised of the need for long term treatment and the importance of lifestyle modifications to improve her current health and to decrease her risk of future health problems.  AGREE: Multiple dietary modification options and treatment options were discussed and  Frankie agreed to follow the recommendations documented in the above note.  ARRANGE: Adalei was educated on the importance of frequent visits to treat obesity as outlined per CMS and USPSTF guidelines and agreed to schedule her next follow up appointment today.  Wilhemena Durie, am acting as transcriptionist for Briscoe Deutscher, DO  I have reviewed the above documentation for accuracy and completeness, and I agree with the above. Briscoe Deutscher, DO

## 2019-02-11 ENCOUNTER — Encounter (INDEPENDENT_AMBULATORY_CARE_PROVIDER_SITE_OTHER): Payer: Self-pay | Admitting: Family Medicine

## 2019-02-15 ENCOUNTER — Other Ambulatory Visit (HOSPITAL_COMMUNITY): Payer: Medicare Other

## 2019-02-16 NOTE — Progress Notes (Signed)
Office: [phone]  /  Fax: [phone]    Date: March 01, 2019   Time Seen: 9 :40am Duration: 23 minutes Provider: Glennie Isle, Psy.D. Type of Session: Individual Therapy  Type of Contact: Face-to-face  Session Content: Morgan Odonnell is a 68 y.o. female presenting for a follow-up appointment to address the previously established treatment goal of decreasing emotional eating. The session was initiated with the administration of the PHQ-9 and GAD-7, as well as a brief check-in. Morgan Odonnell shared, "Eating is going fine." Physical and emotional hunger was reviewed. She explained, "I eat only when I have the chance to eat." This was further explored. Morgan Odonnell explained food options are limited when family members are not home. She stated she will eat fruit, cheese sticks, sandwiches, and chicken salad. She added, "I get plenty of protein." It was recommended she discuss further food options that require minimal preparation during her appointment with Dr. Juleen China today; she agreed. Moreover, psychoeducation regarding triggers for emotional eating was provided. Morgan Odonnell was provided a handout, and encouraged to utilize the handout between now and the next appointment to increase awareness of triggers and frequency. Morgan Odonnell agreed. This provider also discussed behavioral strategies for specific triggers, such as placing the utensil down when conversing to avoid mindless eating.  Morgan Odonnell was receptive to today's appointment as evidenced by openness to sharing, responsiveness to feedback, and willingness to explore triggers for emotional eating.  Mental Status Examination:  Appearance: well groomed and appropriate hygiene  Behavior: appropriate to circumstances Mood: euthymic Affect: mood congruent Speech: normal in rate, volume, and tone Eye Contact: appropriate Psychomotor Activity: appropriate Gait: ambulates with wheelchair Thought Process: linear, logical, and goal directed  Thought  Content/Perception: no hallucinations, delusions, bizarre thinking or behavior reported or observed and no evidence of suicidal and homicidal ideation, plan, and intent Orientation: time, person, place and purpose of appointment Memory/Concentration: memory, attention, language, and fund of knowledge intact  Insight/Judgment: good  Structured Assessments Results: The Patient Health Questionnaire-9 (PHQ-9) is a self-report measure that assesses symptoms and severity of depression over the course of the last two weeks. Morgan Odonnell obtained a score of 1 suggesting minimal depression. Morgan Odonnell Odonnell the endorsed symptoms to be not difficult at all. [0= Not at all; 1= Several days; 2= More than half the days; 3= Nearly every day] Morgan Odonnell or pleasure in doing things 1  Feeling down, depressed, or hopeless 0  Trouble falling or staying asleep, or sleeping too much 0  Feeling tired or having Morgan energy 0  Poor appetite or overeating 0  Feeling bad about yourself --- or that you are a failure or have let yourself or your family down 0  Trouble concentrating on things, such as reading the newspaper or watching television 0  Moving or speaking so slowly that other people could have noticed? Or the opposite --- being so fidgety or restless that you have been moving around a lot more than usual 0  Thoughts that you would be better off dead or hurting yourself in some way 0  PHQ-9 Score 1    The Generalized Anxiety Disorder-7 (GAD-7) is a brief self-report measure that assesses symptoms of anxiety over the course of the last two weeks. Morgan Odonnell obtained a score of 1 suggesting minimal anxiety. Morgan Odonnell the endorsed symptoms to be not difficult at all. [0= Not at all; 1= Several days; 2= Over half the days; 3= Nearly every day] Feeling nervous, anxious, on edge 0  Not being able to stop or control worrying  0  Worrying too much about different things 0  Trouble relaxing 0  Being so restless  that it's hard to sit still 0  Becoming easily annoyed or irritable 1  Feeling afraid as if something awful might happen 0  GAD-7 Score 1   Interventions:  Conducted a brief chart review Verbally administered PHQ-9 and GAD-7 for symptom monitoring Provided empathic reflections and validation Reviewed content from the previous session Employed supportive psychotherapy interventions to facilitate reduced distress and to improve coping skills with identified stressors Employed motivational interviewing skills to assess patient's willingness/desire to adhere to recommended medical treatments and assignments Psychoeducation provided regarding triggers for emotional eating  DSM-5 Diagnosis: 311 (F32.8) Other Specified Depressive Disorder, Emotional Eating Behaviors  Treatment Goal & Progress: During the initial appointment with this provider, the following treatment goal was established: decrease emotional eating. [name] has demonstrated progress in her goal as evidenced by increased awareness of hunger patterns.   Plan: Due to financial concerns, [name] requested the next appointment be scheduled in 3-4 weeks. The next session will focus on working towards the established treatment goal.

## 2019-03-01 ENCOUNTER — Other Ambulatory Visit: Payer: Self-pay

## 2019-03-01 ENCOUNTER — Encounter (INDEPENDENT_AMBULATORY_CARE_PROVIDER_SITE_OTHER): Payer: Self-pay | Admitting: Family Medicine

## 2019-03-01 ENCOUNTER — Ambulatory Visit (INDEPENDENT_AMBULATORY_CARE_PROVIDER_SITE_OTHER): Payer: Medicare Other | Admitting: Family Medicine

## 2019-03-01 ENCOUNTER — Ambulatory Visit (HOSPITAL_COMMUNITY): Payer: Medicare Other | Attending: Cardiovascular Disease

## 2019-03-01 ENCOUNTER — Ambulatory Visit (INDEPENDENT_AMBULATORY_CARE_PROVIDER_SITE_OTHER): Payer: Medicare Other | Admitting: Psychology

## 2019-03-01 VITALS — BP 111/66 | HR 75 | Temp 98.2°F | Ht 62.0 in | Wt 305.0 lb

## 2019-03-01 DIAGNOSIS — Z6841 Body Mass Index (BMI) 40.0 and over, adult: Secondary | ICD-10-CM

## 2019-03-01 DIAGNOSIS — J449 Chronic obstructive pulmonary disease, unspecified: Secondary | ICD-10-CM | POA: Diagnosis not present

## 2019-03-01 DIAGNOSIS — E8881 Metabolic syndrome: Secondary | ICD-10-CM | POA: Diagnosis not present

## 2019-03-01 DIAGNOSIS — F3289 Other specified depressive episodes: Secondary | ICD-10-CM

## 2019-03-01 DIAGNOSIS — R0602 Shortness of breath: Secondary | ICD-10-CM | POA: Diagnosis not present

## 2019-03-01 DIAGNOSIS — E559 Vitamin D deficiency, unspecified: Secondary | ICD-10-CM

## 2019-03-01 DIAGNOSIS — E88819 Insulin resistance, unspecified: Secondary | ICD-10-CM

## 2019-03-01 LAB — ECHOCARDIOGRAM COMPLETE
Height: 62 in
Weight: 4880 oz

## 2019-03-01 MED ORDER — VITAMIN D (ERGOCALCIFEROL) 1.25 MG (50000 UNIT) PO CAPS: 50000.0000 [IU] | ORAL_CAPSULE | ORAL | 0 refills | Status: DC

## 2019-03-03 NOTE — Progress Notes (Signed)
Office: [phone]  /  Fax: [phone]   HPI:  Chief Complaint: OBESITY Morgan Odonnell is here to discuss her progress with her obesity treatment plan. She is on the lower carbohydrate, vegetable and lean protein rich diet plan and states she is following her eating plan approximately 80-90 % of the time. She states she is exercising 0 minutes 0 times per week.  Morgan Odonnell's husband is enjoying hunting season. She is at home and worried about being able to get food from the kitchen. Her go-to's are yogurt and string cheese.  Today's visit was # 4  Starting weight: 322 lbs Starting date: 01/13/2019 Today's weight : 305 lbs Today's date: 03/01/2019 Total lbs lost to date: 17 Total lbs lost since last in-office visit: 7  Insulin Resistance Morgan Odonnell has a diagnosis of insulin resistance. She is stable, and she notes subjective hypoglycemic events while on a low carbohydrate diet. She estimates she has been around 60 grams of carbohydrates daily. Her meals are irregular when her husband is not at home.   Vitamin D Deficiency Morgan Odonnell has a diagnosis of vitamin D deficiency. She is taking Vit D. She feels more energy. Due to cost issues, we will give a 90 day supply of Vit D prescription.  Depression with Emotional Eating Behaviors Morgan Odonnell is seeing Dr. Mallie Mussel, our Bariatric Psychologist. She feels that it is helpful.  COPD Morgan Odonnell continues continuous 3LNC, and she has an echocardiogram today.   ASSESSMENT AND PLAN:  Insulin resistance  Vitamin D deficiency - Plan: Vitamin D, Ergocalciferol, (DRISDOL) 1.25 MG (50000 UT) CAPS capsule  Chronic obstructive pulmonary disease, unspecified COPD type (HCC)  Other depression, with emotional eating   Class 3 severe obesity with serious comorbidity and body mass index (BMI) of 50.0 to 59.9 in adult, unspecified obesity type ([place])  PLAN:  Insulin Resistance Morgan Odonnell will continue to work on weight loss, exercise, and decreasing  simple carbohydrates to help decrease the risk of diabetes. Morgan Odonnell agreed to follow-up with [name] as directed to closely monitor her progress.  Vitamin D Deficiency Low vitamin D level contributes to fatigue and are associated with obesity, breast, and colon cancer. Morgan Odonnell agrees to continue taking prescription Vit D 50,000 IU every week #12, 90 day supply with no refills. She will follow up for routine testing of vitamin D, at least 2-3 times per year to avoid over-replacement. We will continue to monitor.  Emotional Eating Behaviors (other depression) Behavior modification techniques were discussed today to help Morgan Odonnell deal with her emotional/non-hunger eating behaviors. We will continue to follow and monitor her progress.  COPD We will continue to monitor.  Obesity Morgan Odonnell is currently in the action stage of change. As such, her goal is to continue with weight loss efforts. She has agreed to follow a lower carbohydrate, vegetable and lean protein rich diet plan. Morgan Odonnell has been instructed to work up to a goal of 150 minutes of combined cardio and strengthening exercise per week for weight loss and overall health benefits. She is in a wheelchair and will consider physical therapy going forward. We discussed the following Behavioral Modification Strategies today: increasing lean protein intake, increase H20 intake, work on meal planning and easy cooking plans and dealing with family or coworker sabotage.   Morgan Odonnell has agreed to follow up with our clinic in 2 weeks. She was informed of the importance of frequent follow up visits to maximize her success with intensive lifestyle modifications for her multiple health conditions.  ALLERGIES: Allergies  Allergen Reactions  .  Gabapentin     "slept for 2 days"  . Novocain [Procaine] Other (See Comments)    intolerance    MEDICATIONS: Current Outpatient Medications on File Prior to Visit  Medication Sig Dispense Refill  .  albuterol (PROVENTIL HFA;VENTOLIN HFA) 108 (90 BASE) MCG/ACT inhaler Inhale 2 puffs into the lungs every 6 (six) hours as needed for wheezing.     Marland Kitchen aspirin EC 81 MG EC tablet Take 1 tablet (81 mg total) by mouth daily. 30 tablet 0  . baclofen (LIORESAL) 10 MG tablet Take 1 tablet by mouth 4 (four) times daily as needed for muscle spasms.     Marland Kitchen buPROPion (WELLBUTRIN) 100 MG tablet Take 100 mg by mouth 2 (two) times daily.    . celecoxib (CELEBREX) 200 MG capsule Take 200 mg by mouth daily.    . citalopram (CELEXA) 20 MG tablet Take 1 tablet (20 mg total) by mouth daily. 30 tablet 0  . diclofenac sodium (VOLTAREN) 1 % GEL Apply 1 application topically 4 (four) times daily. Applied to knees, shoulders, wrists, and ankles    . diphenhydrAMINE (BENADRYL) 50 MG capsule Take 50 mg by mouth every 6 (six) hours as needed.    . furosemide (LASIX) 40 MG tablet 2 tablets (80mg ) by mouth two times a day 120 tablet 1  . levothyroxine (SYNTHROID) 25 MCG tablet Take 1 tab WITH LEVOTHYROXINE 200 MCG on Monday, Wednesday, and Friday. 30 tablet 0  . levothyroxine (SYNTHROID, LEVOTHROID) 200 MCG tablet Take 200 mcg by mouth daily before breakfast.    . metoprolol tartrate (LOPRESSOR) 25 MG tablet Take 0.5 tablets (12.5 mg total) by mouth 2 (two) times daily. 30 tablet 0  . nicotine polacrilex (NICORETTE) 4 MG gum Take 4 mg by mouth as needed for smoking cessation.    Marland Kitchen oxyCODONE-acetaminophen (PERCOCET/ROXICET) 5-325 MG tablet Take by mouth every 4 (four) hours as needed for severe pain.    . potassium chloride SA (K-DUR,KLOR-CON) 20 MEQ tablet 2 tablets (40 mEq) by mouth in the AM and 1 tablet (20 mEq) by mouth in the PM 90 tablet 0  . pregabalin (LYRICA) 100 MG capsule Take 1 capsule (100 mg total) by mouth 2 (two) times daily. (Patient taking differently: Take 50 mg by mouth 2 (two) times daily. ) 60 capsule 0  . Spacer/Aero-Holding Chambers (AEROCHAMBER Z-STAT PLUS CHAMBR) MISC Use as directed 1 each 0  . SPIRIVA  HANDIHALER 18 MCG inhalation capsule PLACE 1 CAPSULE (18 MCG TOTAL) INTO INHALER AND INHALE DAILY. 30 capsule 6  . SYMBICORT 160-4.5 MCG/ACT inhaler INHALE TWO puffs into lung TWICE DAILY 10.2 g 11   No current facility-administered medications on file prior to visit.    PAST MEDICAL HISTORY: Past Medical History:  Diagnosis Date  . Allergies   . Anemia   . Back pain   . Bilateral swelling of feet   . CHF (congestive heart failure) ([place])   . Chronic cough   . COPD (chronic obstructive pulmonary disease) ([place])   . Depression   . GERD (gastroesophageal reflux disease)   . Heart murmur   . Hypertension   . Hypocapnia   . Hypothyroid   . Knee pain   . Leg edema    chronic, bilateral  . Lightheaded   . Low back pain   . Morbid obesity ([place])   . Neck pain   . Obesity hypoventilation syndrome ([place])   . Oral lesion   . Shortness of breath   . Shortness of  breath   . Shoulder pain   . Snoring     PAST SURGICAL HISTORY: Past Surgical History:  Procedure Laterality Date  . APPENDECTOMY    . CHOLECYSTECTOMY    . COLONOSCOPY N/A 06/25/2013   Procedure: COLONOSCOPY;  Surgeon: [name], MD;  Location: [facility];  Service: Endoscopy;  Laterality: N/A;  . ECTOPIC PREGNANCY SURGERY    . Left Ovary Removed    . MENISCUS REPAIR    . TONSILLECTOMY    . TUBAL LIGATION      SOCIAL HISTORY: Social History   Tobacco Use  . Smoking status: Former Smoker    Packs/day: 2.00    Years: 45.00    Pack years: 90.00    Types: Cigarettes    Quit date: 04/12/2015    Years since quitting: 3.8  . Smokeless tobacco: Never Used  . Tobacco comment: still chews nicotine gum  Substance Use Topics  . Alcohol use: No    Alcohol/week: 0.0 standard drinks  . Drug use: No    FAMILY HISTORY: Family History  Problem Relation Age of Onset  . Ovarian cancer Mother   . Lung disease Mother   . Heart failure Mother   . Hypertension Mother   . Heart disease Mother   . Seizures Mother   .  Cancer Mother   . Depression Mother   . Sleep apnea Mother   . Alcoholism Mother   . Eating disorder Mother   . Obesity Mother   . Hypertension Father   . Eating disorder Father   . Obesity Father   . Anxiety disorder Father   . Lymphoma Brother   . Ovarian cancer Maternal Aunt   . Asthma Daughter     ROS: Review of Systems  Constitutional: Positive for weight loss.  Psychiatric/Behavioral: Positive for depression (Emotional eating).    PHYSICAL EXAM: Blood pressure 111/66, pulse 75, temperature 98.2 F (36.8 C), temperature source Oral, height 5\' 2"  (1.575 m), weight (!) 305 lb (138.3 kg), SpO2 96 %. Body mass index is 55.79 kg/m. Physical Exam Vitals reviewed.  Constitutional:      Appearance: Normal appearance. She is obese.  Cardiovascular:     Rate and Rhythm: Normal rate.     Pulses: Normal pulses.  Pulmonary:     Effort: Pulmonary effort is normal.     Breath sounds: Normal breath sounds.  Musculoskeletal:     Comments: In a wheelchair  Skin:    General: Skin is warm and dry.  Neurological:     Mental Status: She is alert and oriented to person, place, and time.  Psychiatric:        Mood and Affect: Mood normal.        Behavior: Behavior normal.     RECENT LABS AND TESTS: BMET    Component Value Date/Time   NA 139 01/13/2019 1646   K 4.0 01/13/2019 1646   CL 98 01/13/2019 1646   CO2 28 01/13/2019 1646   GLUCOSE 86 01/13/2019 1646   GLUCOSE 92 07/04/2017 0600   BUN 15 01/13/2019 1646   CREATININE 0.96 01/13/2019 1646   CALCIUM 8.6 (L) 01/13/2019 1646   GFRNONAA 62 01/13/2019 1646   GFRAA 71 01/13/2019 1646   Lab Results  Component Value Date   HGBA1C 5.4 01/13/2019   Lab Results  Component Value Date   INSULIN 24.5 01/13/2019   CBC    Component Value Date/Time   WBC 9.0 01/13/2019 1646   WBC 9.9 06/29/2017 0712  RBC 4.76 01/13/2019 1646   RBC 4.66 06/29/2017 0712   HGB 12.6 01/13/2019 1646   HCT 39.4 01/13/2019 1646   PLT 227  01/13/2019 1646   MCV 83 01/13/2019 1646   MCH 26.5 (L) 01/13/2019 1646   MCH 27.0 06/29/2017 0712   MCHC 32.0 01/13/2019 1646   MCHC 30.6 06/29/2017 0712   RDW 13.6 01/13/2019 1646   LYMPHSABS 2.0 01/13/2019 1646   MONOABS 1.0 06/26/2017 2131   EOSABS 0.3 01/13/2019 1646   BASOSABS 0.1 01/13/2019 1646   Iron/TIBC/Ferritin/ %Sat    Component Value Date/Time   IRON 63 01/13/2019 1250   TIBC 383 01/13/2019 1250   FERRITIN 36 01/13/2019 1250   IRONPCTSAT 16 01/13/2019 1250   Lipid Panel     Component Value Date/Time   CHOL 133 01/13/2019 1646   TRIG 78 01/13/2019 1646   HDL 39 (L) 01/13/2019 1646   LDLCALC 79 01/13/2019 1646   Hepatic Function Panel     Component Value Date/Time   PROT 7.3 01/13/2019 1646   ALBUMIN 3.7 (L) 01/13/2019 1646   AST 28 01/13/2019 1646   ALT 17 01/13/2019 1646   ALKPHOS 166 (H) 01/13/2019 1646   BILITOT 0.6 01/13/2019 1646      Component Value Date/Time   TSH 6.750 (H) 01/13/2019 1646     OBESITY BEHAVIORAL INTERVENTION VISIT DOCUMENTATION FOR INSURANCE (~15 minutes)   ASK: We discussed the diagnosis of obesity with Morgan Odonnell today and Morgan Odonnell agreed to give [name] permission to discuss obesity behavioral modification therapy today.  ASSESS: Orangie has the diagnosis of obesity and her BMI today is 55.77 Terralyn is in the action stage of change   ADVISE: Takyra was educated on the multiple health risks of obesity as well as the benefit of weight loss to improve her health. She was advised of the need for long term treatment and the importance of lifestyle modifications to improve her current health and to decrease her risk of future health problems.  AGREE: Multiple dietary modification options and treatment options were discussed and  Catlin agreed to follow the recommendations documented in the above note.  ARRANGE: Dorothee was educated on the importance of frequent visits to treat obesity as outlined per CMS  and USPSTF guidelines and agreed to schedule her next follow up appointment today.  Morgan Odonnell, am acting as transcriptionist for Briscoe Deutscher, DO  I have reviewed the above documentation for accuracy and completeness, and I agree with the above. Briscoe Deutscher, DO

## 2019-03-16 NOTE — Progress Notes (Signed)
  Office: [phone]  /  Fax: [phone]    Date: March 24, 2019   Time Seen: 8:42am Duration: 24 minutes Provider: Glennie Odonnell, Psy.D. Type of Session: Individual Therapy  Type of Contact: Face-to-face  Session Content: Morgan Odonnell is a 69 y.o. female presenting for a follow-up appointment to address the previously established treatment goal of decreasing emotional eating. She was accompanied by her husband Morgan Odonnell), and Morgan Odonnell requested he stay for the duration of the appointment to assist her in sharing about triggers for emotional eating. She acknowledged understanding confidentiality (discussed at the initial appointment) as it relates to having her husband present and Morgan Odonnell provided verbal consent to proceed, adding, "I would like him to stay."   The session was initiated with a brief check-in. Morgan Odonnell discussed engaging in learned strategies (e.g., placing her utensil down when conversing); however, she described feeling triggered to eat when she sees or smells foods her husband is preparing for himself, adding he typically prepares her meals. She also expressed a desire to do more for herself. As such, she was engaged in problem solving. Morgan Odonnell was receptive to reviewing her meal plan to determine what preparations she can safely do on her own. Her husband also reported a plan to enter with groceries through the back door to reduce temptations. Additionally, this provider discussed the time frame it can typically take for the brain and stomach to connect in regard to fullness; Morgan Odonnell was receptive to waiting approximately 20 minutes and focusing on water intake prior to eating more. Moreover, psychoeducation regarding pleasurable activities, including its impact on emotional eating and overall well-being was provided. Morgan Odonnell was provided with a handout with various options of pleasurable activities, and was encouraged to engage in one activity a day and additional  activities as needed when triggered to emotionally eat. Morgan Odonnell agreed. Overall,  Morgan Odonnell was receptive to today's appointment as evidenced by openness to sharing, responsiveness to feedback, and willingness to implement discussed strategies .  Mental Status Examination:  Appearance: well groomed and appropriate hygiene  Behavior: appropriate to circumstances Mood: euthymic Affect: mood congruent Speech: normal in rate, volume, and tone Eye Contact: appropriate Psychomotor Activity: appropriate Gait: ambulates with wheelchair Thought Process: linear, logical, and goal directed  Thought Content/Perception: no hallucinations, delusions, bizarre thinking or behavior reported or observed and no evidence of suicidal and homicidal ideation, plan, and intent Orientation: time, person, place and purpose of appointment Memory/Concentration: memory, attention, language, and fund of knowledge intact  Insight/Judgment: good  Interventions:  Conducted a brief chart review Provided empathic reflections and validation Reviewed content from the previous session Employed supportive psychotherapy interventions to facilitate reduced distress and to improve coping skills with identified stressors Employed motivational interviewing skills to assess patient's willingness/desire to adhere to recommended medical treatments and assignments Engaged patient in problem solving Psychoeducation provided regarding pleasurable activities  DSM-5 Diagnosis: 311 (F32.8) Other Specified Depressive Disorder, Emotional Eating Behaviors  Treatment Goal & Progress: During the initial appointment with this provider, the following treatment goal was established: decrease emotional eating. [name] has demonstrated progress in her goal as evidenced by increased awareness of hunger patterns and increased awareness of triggers for emotional eating. [name] also continues to demonstrate willingness to engage in learned  skill(s).  Plan: The next appointment will be scheduled in two weeks. The next session will focus on working towards the established treatment goal.

## 2019-03-24 ENCOUNTER — Ambulatory Visit (INDEPENDENT_AMBULATORY_CARE_PROVIDER_SITE_OTHER): Payer: Medicare Other | Admitting: Psychology

## 2019-03-24 ENCOUNTER — Other Ambulatory Visit: Payer: Self-pay

## 2019-03-24 ENCOUNTER — Encounter (INDEPENDENT_AMBULATORY_CARE_PROVIDER_SITE_OTHER): Payer: Self-pay | Admitting: Family Medicine

## 2019-03-24 ENCOUNTER — Ambulatory Visit (INDEPENDENT_AMBULATORY_CARE_PROVIDER_SITE_OTHER): Payer: Medicare Other | Admitting: Family Medicine

## 2019-03-24 VITALS — BP 125/70 | HR 91 | Temp 98.3°F | Ht 62.0 in | Wt 301.0 lb

## 2019-03-24 DIAGNOSIS — F3289 Other specified depressive episodes: Secondary | ICD-10-CM | POA: Diagnosis not present

## 2019-03-24 DIAGNOSIS — E8881 Metabolic syndrome: Secondary | ICD-10-CM

## 2019-03-24 DIAGNOSIS — E038 Other specified hypothyroidism: Secondary | ICD-10-CM | POA: Diagnosis not present

## 2019-03-24 DIAGNOSIS — Z6841 Body Mass Index (BMI) 40.0 and over, adult: Secondary | ICD-10-CM

## 2019-03-24 DIAGNOSIS — I5189 Other ill-defined heart diseases: Secondary | ICD-10-CM

## 2019-03-24 DIAGNOSIS — E559 Vitamin D deficiency, unspecified: Secondary | ICD-10-CM

## 2019-03-24 DIAGNOSIS — I519 Heart disease, unspecified: Secondary | ICD-10-CM

## 2019-03-24 MED ORDER — LEVOTHYROXINE SODIUM 200 MCG PO TABS: 200.0000 ug | ORAL_TABLET | Freq: Every day | ORAL | 0 refills | Status: DC

## 2019-03-24 MED ORDER — LEVOTHYROXINE SODIUM 25 MCG PO TABS: ORAL_TABLET | ORAL | 0 refills | Status: DC

## 2019-03-24 NOTE — Progress Notes (Signed)
Chief Complaint:   OBESITY Morgan Odonnell is here to discuss her progress with her obesity treatment plan along with follow-up of her obesity related diagnoses. Morgan Odonnell is following a lower carbohydrate, vegetable and lean protein rich diet plan and states she is following her eating plan approximately 80% of the time. Morgan Odonnell states she is doing arm and leg exercises for 5 minutes 7 times per week.  Today's visit was #: 5 Starting weight: 322 lbs Starting date: 01/13/2019 Today's weight: 301 lbs Today's date: 03/24/2019 Total lbs lost to date: 21 lbs Total lbs lost since last in-office visit: 4 lbs  Interim History: Morgan Odonnell says she is able to adhere to the diet well.  She is happy with her progress.  Subjective:   1. Other depression [name] is working with Dr. Mallie Mussel and is doing well.  2. Insulin resistance [name] has a diagnosis of insulin resistance based on her elevated fasting insulin level >5. She continues to work on diet and exercise to decrease her risk of diabetes.  She is adhering to a low carbohydrate (less than 60 g/day) diet.  Lab Results  Component Value Date   INSULIN 24.5 01/13/2019   3. Vitamin D deficiency Vitamin D level was 8.7 on 01/13/2019. She is currently taking vit D. She denies nausea, vomiting or muscle weakness.  4. Other specified hypothyroidism Taking levothyroxine 200 mcg daily along with 25 mcg on Monday, Wednesday, and Friday.  Lab Results  Component Value Date   TSH 6.750 (H) 01/13/2019   5. Grade I diastolic dysfunction Her ECHO from 03/01/2019 was reviewed with patient. Her EF is 50-55%.  Assessment/Plan:   1. Other depression Behavior modification techniques were discussed today to help Morgan Odonnell deal with her emotional/non-hunger eating behaviors.  Orders and follow up as documented in patient record.   2. Insulin resistance Morgan Odonnell will continue to work on weight loss, exercise, and decreasing simple  carbohydrates to help decrease the risk of diabetes. Timber agreed to follow-up with [name] as directed to closely monitor her progress.  3. Vitamin D deficiency Low Vitamin D level contributes to fatigue and are associated with obesity, breast, and colon cancer. She agrees to continue to take prescription Vitamin D @50 ,000 IU every week and will follow-up for routine testing of vitamin D, at least 2-3 times per year to avoid over-replacement.  4. Other specified hypothyroidism Patient with long-standing hypothyroidism, on levothyroxine therapy. She appears euthyroid. Orders and follow up as documented in patient record.  Counseling . Good thyroid control is important for overall health. Supratherapeutic thyroid levels are dangerous and will not improve weight loss results. . The correct way to take levothyroxine is fasting, with water, separated by at least 30 minutes from breakfast, and separated by more than 4 hours from calcium, iron, multivitamins, acid reflux medications (PPIs).   Orders - T3 - T4, free - TSH - levothyroxine (SYNTHROID) 200 MCG tablet; Take 1 tablet (200 mcg total) by mouth daily before breakfast.  Dispense: 90 tablet; Refill: 0  5. Grade I diastolic dysfunction ECHO reassuring. With continued weight loss, will start exercise to combat deconditioning.   6. Class 3 severe obesity with serious comorbidity and body mass index (BMI) of 50.0 to 59.9 in adult, unspecified obesity type (HCC) Morgan Odonnell is currently in the action stage of change. As such, her goal is to continue with weight loss efforts. She has agreed to following a lower carbohydrate, vegetable and lean protein rich diet plan.   We discussed  the following exercise goals today: Older adults should follow the adult guidelines. When older adults cannot meet the adult guidelines, they should be as physically active as their abilities and conditions will allow.  Older adults should do exercises that maintain or  improve balance if they are at risk of falling.   We discussed the following behavioral modification strategies today: increasing lean protein intake, decreasing simple carbohydrates, increasing vegetables and increasing water intake.  ALLENE BALOUGH has agreed to follow-up with our clinic in 2 weeks. She was informed of the importance of frequent follow-up visits to maximize her success with intensive lifestyle modifications for her multiple health conditions.   Morgan Odonnell was informed we would discuss her lab results at her next visit unless there is a critical issue that needs to be addressed sooner. Morgan Odonnell agreed to keep her next visit at the agreed upon time to discuss these results.  Objective:   Blood pressure 125/70, pulse 91, temperature 98.3 F (36.8 C), temperature source Oral, height 5\' 2"  (1.575 m), weight (!) 301 lb (136.5 kg), SpO2 99 %. Body mass index is 55.05 kg/m.  General: Cooperative, alert, well developed, in no acute distress. HEENT: Conjunctivae and lids unremarkable. Neck: No thyromegaly.  Cardiovascular: Regular rhythm.  Lungs: Normal work of breathing. Extremities: No edema.  Neurologic: No focal deficits.   Lab Results  Component Value Date   CREATININE 0.96 01/13/2019   BUN 15 01/13/2019   NA 139 01/13/2019   K 4.0 01/13/2019   CL 98 01/13/2019   CO2 28 01/13/2019   Lab Results  Component Value Date   ALT 17 01/13/2019   AST 28 01/13/2019   ALKPHOS 166 (H) 01/13/2019   BILITOT 0.6 01/13/2019   Lab Results  Component Value Date   HGBA1C 5.4 01/13/2019   Lab Results  Component Value Date   INSULIN 24.5 01/13/2019   Lab Results  Component Value Date   TSH 6.750 (H) 01/13/2019   Lab Results  Component Value Date   CHOL 133 01/13/2019   HDL 39 (L) 01/13/2019   LDLCALC 79 01/13/2019   TRIG 78 01/13/2019   Lab Results  Component Value Date   WBC 9.0 01/13/2019   HGB 12.6 01/13/2019   HCT 39.4 01/13/2019   MCV 83 01/13/2019    PLT 227 01/13/2019   Lab Results  Component Value Date   IRON 63 01/13/2019   TIBC 383 01/13/2019   FERRITIN 36 01/13/2019    Obesity Behavioral Intervention:   Approximately 15 minutes were spent on the discussion below.  ASK: We discussed the diagnosis of obesity with Morgan Locket today and Mayerlin agreed to give [name] permission to discuss obesity behavioral modification therapy today.  ASSESS: Darlina has the diagnosis of obesity and her BMI today is 55.79. Radhika is in the action stage of change.   ADVISE: Keymoni was educated on the multiple health risks of obesity as well as the benefit of weight loss to improve her health. She was advised of the need for long term treatment and the importance of lifestyle modifications to improve her current health and to decrease her risk of future health problems.  AGREE: Multiple dietary modification options and treatment options were discussed and Jocie agreed to follow the recommendations documented in the above note.  ARRANGE: Shakeelah was educated on the importance of frequent visits to treat obesity as outlined per CMS and USPSTF guidelines and agreed to schedule her next follow up appointment today.  Attestation Statements:  Reviewed by clinician on day of visit: allergies, medications, problem list, medical history, surgical history, family history, social history, and previous encounter notes.  I, Water quality scientist, CMA, am acting as Location manager for [employer], DO.  I have reviewed the above documentation for accuracy and completeness, and I agree with the above. Briscoe Deutscher, DO

## 2019-03-24 NOTE — Progress Notes (Unsigned)
Office: [phone]  /  Fax: [phone]    Date: April 07, 2019   Appointment Start Time: *** Duration: *** minutes Provider: Glennie Isle, Psy.D. Type of Session: Individual Therapy  Location of Patient: {gbptloc:23249} Location of Provider: {Location of Service:22491} Type of Contact: Telepsychological Visit via {gbtelepsych:23399}  Session Content: Prior to initiating telepsychological services, Morgan Odonnell completed an informed consent document, which included the development of a safety plan (i.e., an emergency contact, nearest emergency room, and emergency resources) in the event of an emergency/crisis. Earnestine expressed understanding of the rationale of the safety plan. [name] verbally acknowledged understanding she is ultimately responsible for understanding her insurance benefits for telepsychological and in-person services. This provider also reviewed confidentiality, as it relates to telepsychological services, as well as the rationale for telepsychological services (i.e., to reduce exposure risk to COVID-19). Morgan Odonnell  acknowledged understanding that appointments cannot be recorded without both party consent and she is aware she is responsible for securing confidentiality on her end of the session. [name] verbally consented to proceed.   Morgan Odonnell is a 69 y.o. female presenting via {gbtelepsych:23399} for a follow-up appointment to address the previously established treatment goal of decreasing emotional eating. Today's appointment was a telepsychological visit due to COVID-19. Tauni provided verbal consent for today's telepsychological appointment and she is aware she is responsible for securing confidentiality on her end of the session. Prior to proceeding with today's appointment, [name]'s physical location at the time of this appointment was obtained as well a phone number she could be reached at in the event of technical difficulties. [name] and this provider  participated in today's telepsychological service.   This provider conducted a brief check-in and verbally administered the PHQ-9 and GAD-7. *** [name] was receptive to today's appointment as evidenced by openness to sharing, responsiveness to feedback, and {gbreceptiveness:23401}.  Mental Status Examination:  Appearance: {Appearance:22431} Behavior: {Behavior:22445} Mood: {gbmood:21757} Affect: {Affect:22436} Speech: {Speech:22432} Eye Contact: {Eye Contact:22433} Psychomotor Activity: {Motor Activity:22434} Gait: {gbgait:23404} Thought Process: {thought process:22448}  Thought Content/Perception: {disturbances:22451} Orientation: {Orientation:22437} Memory/Concentration: {gbcognition:22449} Insight/Judgment: {Insight:22446}  Structured Assessments Results: The Patient Health Questionnaire-9 (PHQ-9) is a self-report measure that assesses symptoms and severity of depression over the course of the last two weeks. [name] obtained a score of *** suggesting {GBPHQ9SEVERITY:21752}. [name] finds the endorsed symptoms to be {gbphq9difficulty:21754}. [0= Not at all; 1= Several days; 2= More than half the days; 3= Nearly every day] Little interest or pleasure in doing things ***  Feeling down, depressed, or hopeless ***  Trouble falling or staying asleep, or sleeping too much ***  Feeling tired or having little energy ***  Poor appetite or overeating ***  Feeling bad about yourself --- or that you are a failure or have let yourself or your family down ***  Trouble concentrating on things, such as reading the newspaper or watching television ***  Moving or speaking so slowly that other people could have noticed? Or the opposite --- being so fidgety or restless that you have been moving around a lot more than usual ***  Thoughts that you would be better off dead or hurting yourself in some way ***  PHQ-9 Score ***    The Generalized Anxiety Disorder-7 (GAD-7) is a brief self-report  measure that assesses symptoms of anxiety over the course of the last two weeks. [name] obtained a score of *** suggesting {gbgad7severity:21753}. [name] finds the endorsed symptoms to be {gbphq9difficulty:21754}. [0= Not at all; 1= Several days; 2= Over half the days; 3= Nearly every day] Feeling nervous,  anxious, on edge ***  Not being able to stop or control worrying ***  Worrying too much about different things ***  Trouble relaxing ***  Being so restless that it's hard to sit still ***  Becoming easily annoyed or irritable ***  Feeling afraid as if something awful might happen ***  GAD-7 Score ***   Interventions:  {Interventions for Progress Notes:23405}  DSM-5 Diagnosis: 311 (F32.8) Other Specified Depressive Disorder, Emotional Eating Behaviors  Treatment Goal & Progress: During the initial appointment with this provider, the following treatment goal was established: decrease emotional eating. [name] has demonstrated progress in her goal as evidenced by {gbtxprogress:22839}. [name] also {gbtxprogress2:22951}.  Plan: The next appointment will be scheduled in {gbweeks:21758}, which will be {gbtxmodality:23402}. The next session will focus on {Plan for Next Appointment:23400}.

## 2019-03-25 LAB — TSH: TSH: 1.46 u[IU]/mL (ref 0.450–4.500)

## 2019-03-25 LAB — T3: T3, Total: 98 ng/dL (ref 71–180)

## 2019-03-25 LAB — T4, FREE: Free T4: 1.92 ng/dL — ABNORMAL HIGH (ref 0.82–1.77)

## 2019-04-07 ENCOUNTER — Other Ambulatory Visit: Payer: Self-pay

## 2019-04-07 ENCOUNTER — Ambulatory Visit (INDEPENDENT_AMBULATORY_CARE_PROVIDER_SITE_OTHER): Payer: Medicare Other | Admitting: Family Medicine

## 2019-04-07 ENCOUNTER — Ambulatory Visit (INDEPENDENT_AMBULATORY_CARE_PROVIDER_SITE_OTHER): Payer: Medicare Other | Admitting: Psychology

## 2019-04-07 ENCOUNTER — Encounter (INDEPENDENT_AMBULATORY_CARE_PROVIDER_SITE_OTHER): Payer: Self-pay | Admitting: Family Medicine

## 2019-04-07 VITALS — BP 175/69 | HR 69 | Temp 98.0°F | Ht 62.0 in | Wt 307.0 lb

## 2019-04-07 DIAGNOSIS — Z6841 Body Mass Index (BMI) 40.0 and over, adult: Secondary | ICD-10-CM

## 2019-04-07 DIAGNOSIS — I10 Essential (primary) hypertension: Secondary | ICD-10-CM | POA: Diagnosis not present

## 2019-04-07 NOTE — Progress Notes (Signed)
Chief Complaint:   OBESITY Morgan Odonnell is here to discuss her progress with her obesity treatment plan along with follow-up of her obesity related diagnoses. Morgan Odonnell is on "high protein, low calorie"; "no more than 50 carbs a day" and states she is following her eating plan approximately 80% of the time. Morgan Odonnell states she is exercising 0 minutes 0 times per week.  Today's visit was #: 6 Starting weight: 322 lbs Starting date: 01/13/2019 Today's weight: 307 lbs Today's date: 04/07/2019 Total lbs lost to date: 15 Total lbs lost since last in-office visit: 0  Interim History: Morgan Odonnell has been following her version of Atkins, which has been successful in the past. She suspects she is retaining fluid and has been allowed to increase her Lasix as needed.  Subjective:   Essential hypertension. Morgan Odonnell blood pressure is elevated today, is normally well controlled. She was running late and feels this is why her blood pressure was elevated.  BP Readings from Last 3 Encounters:  04/07/19 (!) 175/69  03/24/19 125/70  03/01/19 111/66   Lab Results  Component Value Date   CREATININE 0.96 01/13/2019   CREATININE 0.84 07/04/2017   CREATININE 0.95 07/03/2017   Assessment/Plan:   Essential hypertension. [name] is working on healthy weight loss and exercise to improve blood pressure control. We will watch for signs of hypotension as she continues her lifestyle modifications. She will continue with diet and medications. She was advised it was okay to increase her Lasix x1 and will follow-up in 2 weeks.  Class 3 severe obesity with serious comorbidity and body mass index (BMI) of 50.0 to 59.9 in adult, unspecified obesity type ([place]).  Morgan Odonnell is currently in the action stage of change. As such, her goal is to continue with weight loss efforts. She has agreed to continue with the Atkins plan and will continue to monitor.  Exercise goals: No exercise has been prescribed  at this time.  Behavioral modification strategies: decrease sodium intake  Morgan Odonnell has agreed to follow-up with our clinic in 2 weeks. She was informed of the importance of frequent follow-up visits to maximize her success with intensive lifestyle modifications for her multiple health conditions.   Objective:   Blood pressure (!) 175/69, pulse 69, temperature 98 F (36.7 C), temperature source Oral, height 5\' 2"  (1.575 m), weight (!) 307 lb (139.3 kg), SpO2 (!) 87 %. Body mass index is 56.15 kg/m.  General: Cooperative, alert, well developed, in no acute distress. HEENT: Conjunctivae and lids unremarkable. Cardiovascular: Regular rhythm.  Lungs: Normal work of breathing. Neurologic: No focal deficits.   Lab Results  Component Value Date   CREATININE 0.96 01/13/2019   BUN 15 01/13/2019   NA 139 01/13/2019   K 4.0 01/13/2019   CL 98 01/13/2019   CO2 28 01/13/2019   Lab Results  Component Value Date   ALT 17 01/13/2019   AST 28 01/13/2019   ALKPHOS 166 (H) 01/13/2019   BILITOT 0.6 01/13/2019   Lab Results  Component Value Date   HGBA1C 5.4 01/13/2019   Lab Results  Component Value Date   INSULIN 24.5 01/13/2019   Lab Results  Component Value Date   TSH 1.460 03/24/2019   Lab Results  Component Value Date   CHOL 133 01/13/2019   HDL 39 (L) 01/13/2019   LDLCALC 79 01/13/2019   TRIG 78 01/13/2019   Lab Results  Component Value Date   WBC 9.0 01/13/2019   HGB 12.6 01/13/2019   HCT  39.4 01/13/2019   MCV 83 01/13/2019   PLT 227 01/13/2019   Lab Results  Component Value Date   IRON 63 01/13/2019   TIBC 383 01/13/2019   FERRITIN 36 01/13/2019   Attestation Statements:   Reviewed by clinician on day of visit: allergies, medications, problem list, medical history, surgical history, family history, social history, and previous encounter notes.  Time spent on visit including pre-visit chart review and post-visit care was 22 minutes.   I, Michaelene Song, am  acting as Location manager for Dennard Nip, MD   I have reviewed the above documentation for accuracy and completeness, and I agree with the above. -  Dennard Nip, MD

## 2019-04-12 ENCOUNTER — Encounter (INDEPENDENT_AMBULATORY_CARE_PROVIDER_SITE_OTHER): Payer: Self-pay

## 2019-04-21 ENCOUNTER — Other Ambulatory Visit: Payer: Self-pay

## 2019-04-21 ENCOUNTER — Ambulatory Visit (INDEPENDENT_AMBULATORY_CARE_PROVIDER_SITE_OTHER): Payer: Medicare Other | Admitting: Family Medicine

## 2019-04-21 ENCOUNTER — Ambulatory Visit (INDEPENDENT_AMBULATORY_CARE_PROVIDER_SITE_OTHER): Payer: Medicare Other | Admitting: Psychology

## 2019-04-21 ENCOUNTER — Encounter (INDEPENDENT_AMBULATORY_CARE_PROVIDER_SITE_OTHER): Payer: Self-pay | Admitting: Family Medicine

## 2019-04-21 VITALS — BP 131/77 | HR 67 | Temp 98.2°F | Ht 62.0 in | Wt 300.0 lb

## 2019-04-21 DIAGNOSIS — F3289 Other specified depressive episodes: Secondary | ICD-10-CM | POA: Diagnosis not present

## 2019-04-21 DIAGNOSIS — E559 Vitamin D deficiency, unspecified: Secondary | ICD-10-CM

## 2019-04-21 DIAGNOSIS — Z6841 Body Mass Index (BMI) 40.0 and over, adult: Secondary | ICD-10-CM | POA: Diagnosis not present

## 2019-04-21 DIAGNOSIS — E8881 Metabolic syndrome: Secondary | ICD-10-CM

## 2019-04-21 NOTE — Progress Notes (Signed)
Chief Complaint:   OBESITY Morgan Odonnell is here to discuss her progress with her obesity treatment plan along with follow-up of her obesity related diagnoses. Morgan Odonnell is on the keto diet and states she is following her eating plan approximately 90% of the time. Morgan Odonnell states she is exercising for 0 minutes 0 times per week.  Today's visit was #: 7 Starting weight: 322 lbs Starting date: 01/13/2019 Today's weight: 300 lbs Today's date: 04/21/2019 Total lbs lost to date: 22 lbs Total lbs lost since last in-office visit: 7 lbs  Interim History: Morgan Odonnell is on a low carb diet.  She is taking her diuretic in the morning only.  She is thinking about going to [facility] to help her daughter.  Subjective:   1. Insulin resistance Morgan Odonnell has a diagnosis of insulin resistance based on her elevated fasting insulin level >5. She continues to work on diet and exercise to decrease her risk of diabetes.  Lab Results  Component Value Date   INSULIN 24.5 01/13/2019   Lab Results  Component Value Date   HGBA1C 5.4 01/13/2019   2. Vitamin D deficiency Morgan Odonnell's Vitamin D level was 8.7 on 01/13/2019. She is currently taking vit D. She denies nausea, vomiting or muscle weakness.  3. Other depression, with emotional eating Morgan Odonnell is struggling with emotional eating and using food for comfort to the extent that it is negatively impacting her health. She has been working on behavior modification techniques to help reduce her emotional eating and has been unsuccessful. She shows no sign of suicidal or homicidal ideations.  Assessment/Plan:   1. Insulin resistance Morgan Odonnell will continue to work on weight loss, exercise, and decreasing simple carbohydrates to help decrease the risk of diabetes. Morgan Odonnell agreed to follow-up with [name] as directed to closely monitor her progress.  2. Vitamin D deficiency Low Vitamin D level contributes to fatigue and are associated with obesity, breast,  and colon cancer. She agrees to continue to take prescription Vitamin D @50 ,000 IU every week and will follow-up for routine testing of Vitamin D, at least 2-3 times per year to avoid over-replacement.  3. Other depression, with emotional eating Behavior modification techniques were discussed today to help Morgan Odonnell deal with her emotional/non-hunger eating behaviors.  Orders and follow up as documented in patient record.   4. Class 3 severe obesity with serious comorbidity and body mass index (BMI) of 50.0 to 59.9 in adult, unspecified obesity type ([facility]) Morgan Odonnell is currently in the action stage of change. As such, her goal is to continue with weight loss efforts. She has agreed to following a lower carbohydrate, vegetable and lean protein rich diet plan.   Exercise goals: Older adults should follow the adult guidelines. When older adults cannot meet the adult guidelines, they should be as physically active as their abilities and conditions will allow.   Behavioral modification strategies: increasing lean protein intake and increasing water intake.  Morgan Odonnell has agreed to follow-up with our clinic in 2 weeks. She was informed of the importance of frequent follow-up visits to maximize her success with intensive lifestyle modifications for her multiple health conditions.   Objective:   Blood pressure 131/77, pulse 67, temperature 98.2 F (36.8 C), temperature source Oral, height 5\' 2"  (1.575 m), weight 300 lb (136.1 kg), SpO2 95 %. Body mass index is 54.87 kg/m.  General: Cooperative, alert, well developed, in no acute distress. HEENT: Conjunctivae and lids unremarkable. Cardiovascular: Regular rhythm.  Lungs: Normal work of breathing. Neurologic: No focal  deficits.   Lab Results  Component Value Date   CREATININE 0.96 01/13/2019   BUN 15 01/13/2019   NA 139 01/13/2019   K 4.0 01/13/2019   CL 98 01/13/2019   CO2 28 01/13/2019   Lab Results  Component Value Date   ALT 17  01/13/2019   AST 28 01/13/2019   ALKPHOS 166 (H) 01/13/2019   BILITOT 0.6 01/13/2019   Lab Results  Component Value Date   HGBA1C 5.4 01/13/2019   Lab Results  Component Value Date   INSULIN 24.5 01/13/2019   Lab Results  Component Value Date   TSH 1.460 03/24/2019   Lab Results  Component Value Date   CHOL 133 01/13/2019   HDL 39 (L) 01/13/2019   LDLCALC 79 01/13/2019   TRIG 78 01/13/2019   Lab Results  Component Value Date   WBC 9.0 01/13/2019   HGB 12.6 01/13/2019   HCT 39.4 01/13/2019   MCV 83 01/13/2019   PLT 227 01/13/2019   Lab Results  Component Value Date   IRON 63 01/13/2019   TIBC 383 01/13/2019   FERRITIN 36 01/13/2019    Obesity Behavioral Intervention:   Approximately 15 minutes were spent on the discussion below.  ASK: We discussed the diagnosis of obesity with Morgan Odonnell today and Morgan Odonnell agreed to give [name] permission to discuss obesity behavioral modification therapy today.  ASSESS: Morgan Odonnell has the diagnosis of obesity and her BMI today is 54.87. Morgan Odonnell is in the action stage of change.   ADVISE: Taelyn was educated on the multiple health risks of obesity as well as the benefit of weight loss to improve her health. She was advised of the need for long term treatment and the importance of lifestyle modifications to improve her current health and to decrease her risk of future health problems.  AGREE: Multiple dietary modification options and treatment options were discussed and Morgan Odonnell agreed to follow the recommendations documented in the above note.  ARRANGE: Dianely was educated on the importance of frequent visits to treat obesity as outlined per CMS and USPSTF guidelines and agreed to schedule her next follow up appointment today.  Attestation Statements:   Reviewed by clinician on day of visit: allergies, medications, problem list, medical history, surgical history, family history, social history, and previous encounter  notes.  I, Water quality scientist, CMA, am acting as Location manager for [employer], DO.  I have reviewed the above documentation for accuracy and completeness, and I agree with the above. Briscoe Deutscher, DO

## 2019-04-21 NOTE — Progress Notes (Signed)
Office: [phone]  /  Fax: [phone]    Date: April 21, 2019    Appointment Start Time: 4:06pm Duration: 30 minutes Provider: Glennie Isle, Psy.D. Type of Session: Individual Therapy  Location of Patient: Home Location of Provider: Provider's Home Type of Contact: Telepsychological Visit via [place] Telephone call  Session Content: This provider called Morgan Odonnell at 4:00pm to help her connect to today's Webex appointment. Directions were provided. Prior to initiating telepsychological services, Morgan Odonnell completed an informed consent document, which included the development of a safety plan (i.e., an emergency contact, nearest emergency room, and emergency resources) in the event of an emergency/crisis. Morgan Odonnell expressed understanding of the rationale of the safety plan. Morgan Odonnell verbally acknowledged understanding she is ultimately responsible for understanding her insurance benefits for telepsychological and in-person services. This provider also reviewed confidentiality, as it relates to telepsychological services, as well as the rationale for telepsychological services (i.e., to reduce exposure risk to COVID-19). Morgan Odonnell  acknowledged understanding that appointments cannot be recorded without both party consent and she is aware she is responsible for securing confidentiality on her end of the session. Morgan Odonnell verbally consented to proceed. Of note, the appointment was switched to a regular telephone call at 4:16pm due to audio issues on Webex.    Morgan Odonnell is a 69 y.o. female presenting via [place] for a follow-up appointment to address the previously established treatment goal of decreasing emotional eating. Today's appointment was a telepsychological visit due to COVID-19. Morgan Odonnell provided verbal consent for today's telepsychological appointment and she is aware she is responsible for securing confidentiality on her end of the session. Prior to proceeding with  today's appointment, Morgan Odonnell's physical location at the time of this appointment was obtained as well a phone number she could be reached at in the event of technical difficulties. Morgan Odonnell and this provider participated in today's telepsychological service.   This provider conducted a brief check-in. Morgan Odonnell reported she lost an additional 7 pounds and shared about recent events, noting she plans to go help her daughter at the beach. Regarding emotional eating, she noted, "I'm doing pretty good with it," adding she is being "very conscious" about what she is eating. Eating habits were briefly reviewed. Pleasurable activities were reviewed. 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 Other day to day pleasurable activity options were reviewed, and she was encouraged to engage in one a day. She was receptive, noting, "I'm happy. I'm doing something positive to help myself." Overall, Morgan Odonnell was receptive to today's appointment as evidenced by openness to sharing, responsiveness to feedback, and willingness to engage in learned skills.  Mental Status Examination:  Appearance: well groomed and appropriate hygiene  Behavior: appropriate to circumstances Mood: euthymic Affect: mood congruent Speech: normal in rate, volume, and tone Eye Contact: appropriate Psychomotor Activity: appropriate Gait: unable to assess Thought Process: linear, logical, and goal directed  Thought Content/Perception: no hallucinations, delusions, bizarre thinking or behavior reported or observed and no evidence of suicidal and homicidal ideation, plan, and intent Orientation: time, person, place and purpose of appointment Memory/Concentration: memory, attention, language, and fund of knowledge intact  Insight/Judgment: good  Interventions:  Conducted a brief chart review Provided empathic reflections and validation Reviewed content from the previous session Employed supportive psychotherapy  interventions to facilitate reduced distress and to improve coping skills with identified stressors Employed motivational interviewing skills to assess patient's willingness/desire to adhere to recommended medical treatments and assignments  DSM-5 Diagnosis: 311 (F32.8) Other Specified Depressive Disorder, Emotional  Eating Behaviors  Treatment Goal & Progress: During the initial appointment with this provider, the following treatment goal was established: decrease emotional eating. Morgan Odonnell has demonstrated progress in her goal as evidenced by increased awareness of hunger patterns, increased awareness of triggers for emotional eating and reduction in emotional eating. Morgan Odonnell also continues to demonstrate willingness to engage in learned skill(s).  Plan: The next appointment will be scheduled in approximately two weeks, which will be via [employer]. The next session will focus on working towards the established treatment goal.

## 2019-04-24 IMAGING — CT CT HEAD W/O CM
4 series · 17 of 47 positions shown, 19 images · non-contrast
Comparison: CT of the head performed 05/17/2010

CLINICAL DATA: Acute onset of altered mental status. Lethargy.

EXAM:
CT HEAD WITHOUT CONTRAST
TECHNIQUE: Contiguous axial images were obtained from the base of the skull
through the vertex without intravenous contrast.

[Series 3: head wo · axial · 0.40mm/px · z∈[-180,-56]mm · 7 of 35 slices shown, 9 images]
[im 5/35  brain]
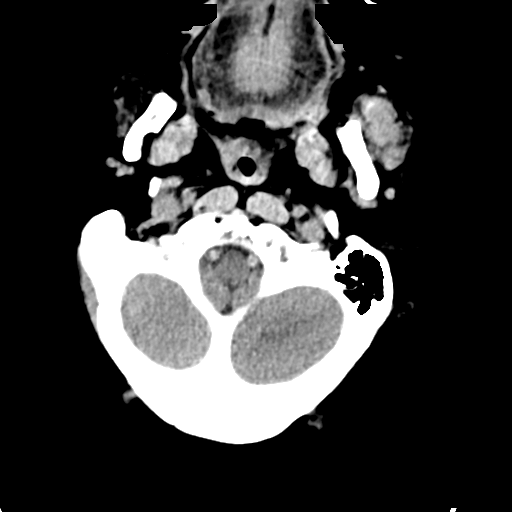
[im 5/35  bone]
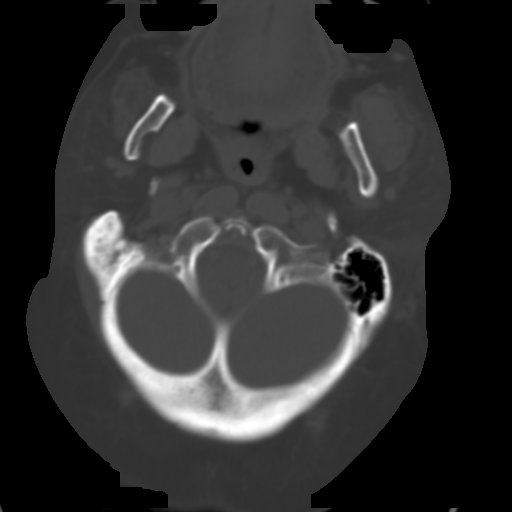
[im 9/35  brain]
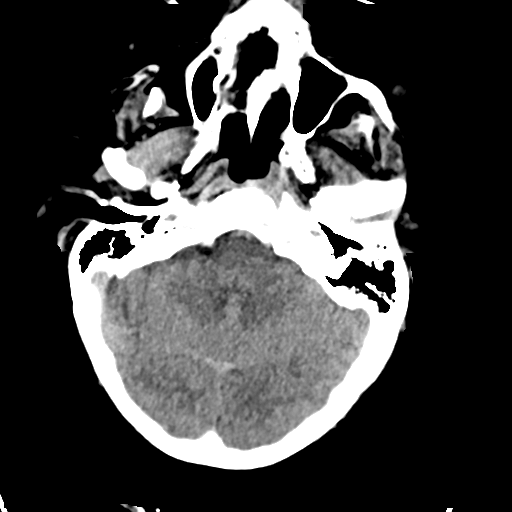
[im 13/35  brain]
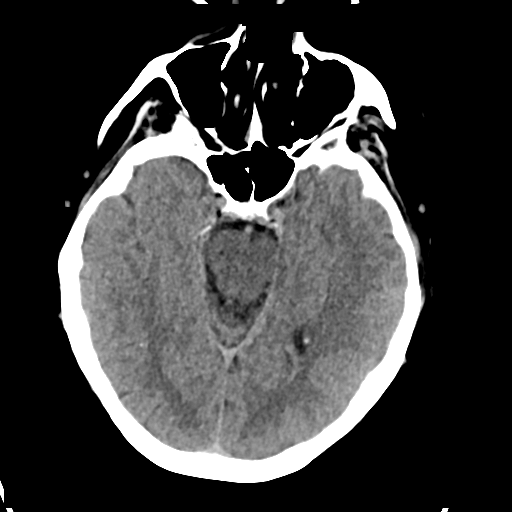
[im 18/35  brain]
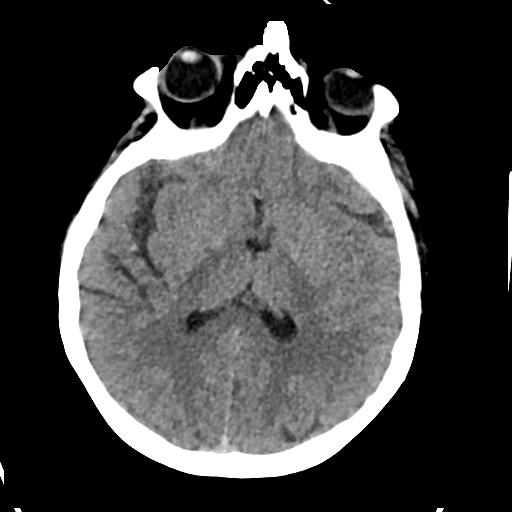
[im 22/35  brain]
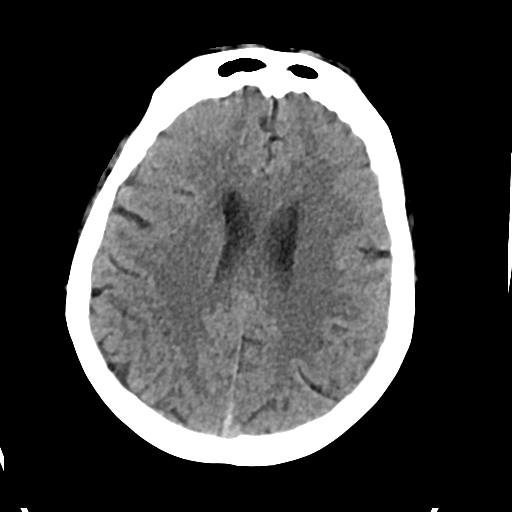
[im 22/35  bone]
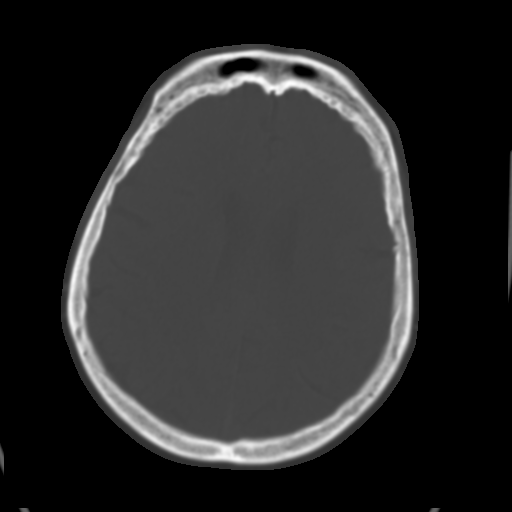
[im 26/35  brain]
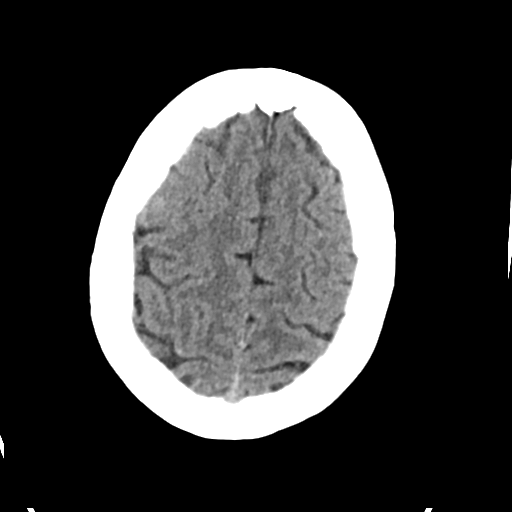
[im 30/35  brain]
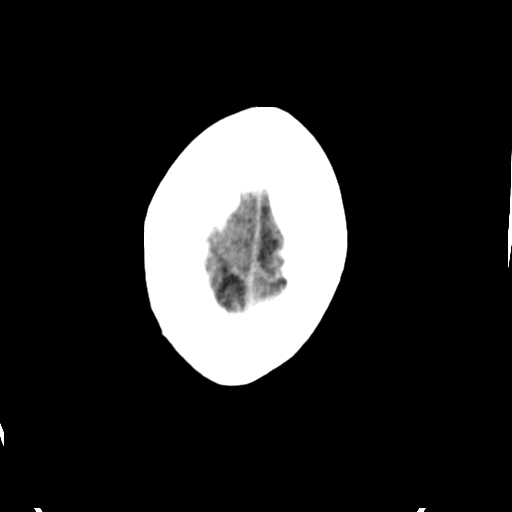

[Series 4: head bone · axial · 0.40mm/px · z∈[-184,-124]mm · 4 of 87 slices shown]
[im 9/87  bone]
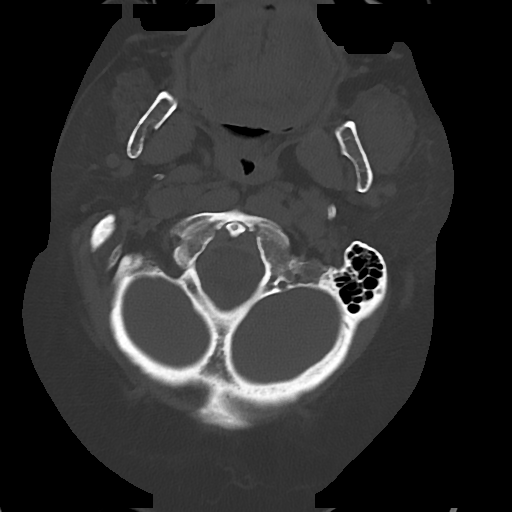
[im 18/87  bone]
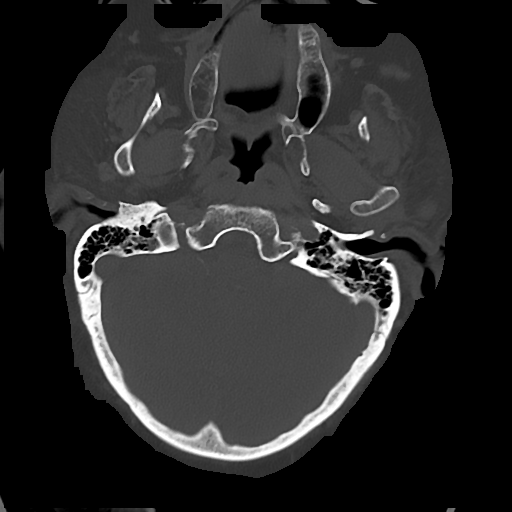
[im 26/87  bone]
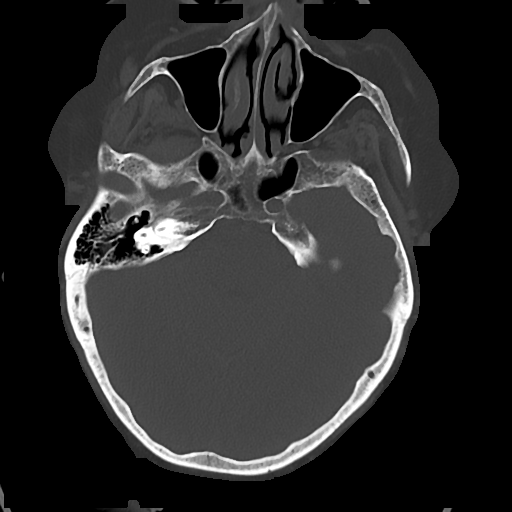
[im 39/87  bone]
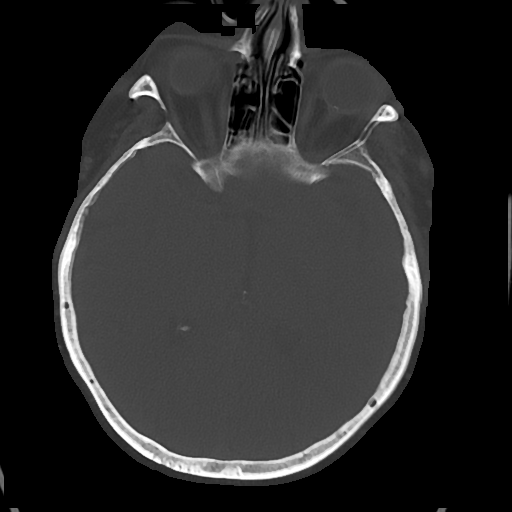

[Series 5: cor soft · coronal · 0.33mm/px · 3 of 67 slices shown]
[im 23/67  brain]
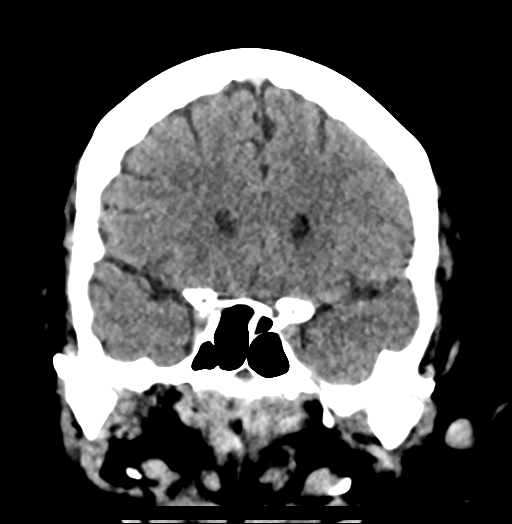
[im 30/67  brain]
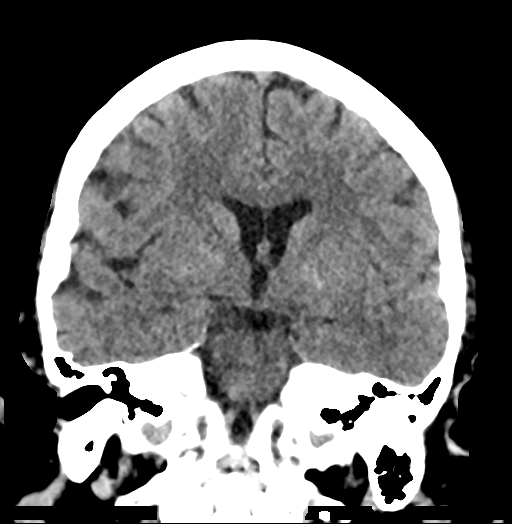
[im 37/67  brain]
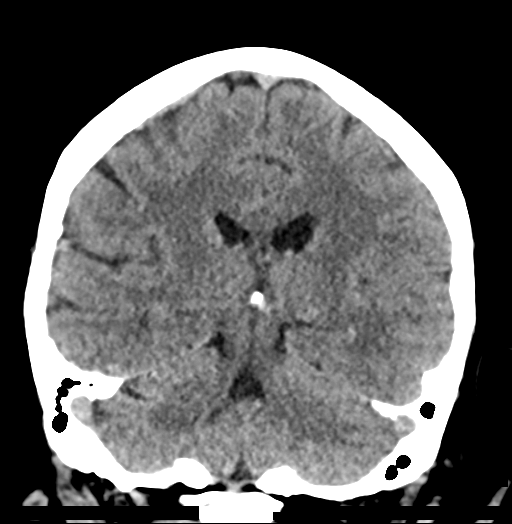

[Series 6: sag soft · sagittal · 0.34mm/px · 3 of 53 slices shown]
[im 18/53  brain]
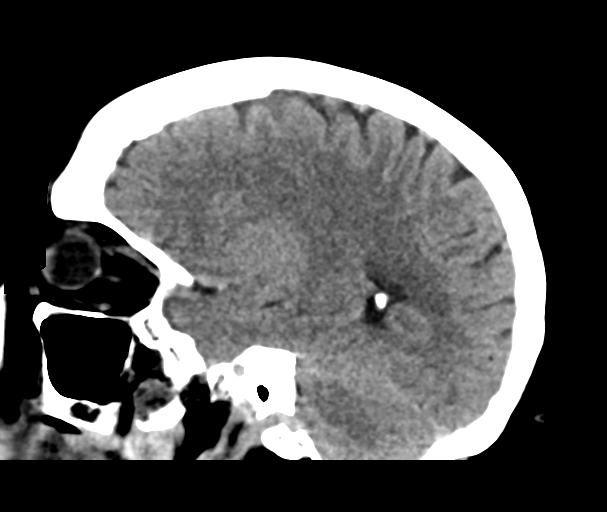
[im 27/53  brain]
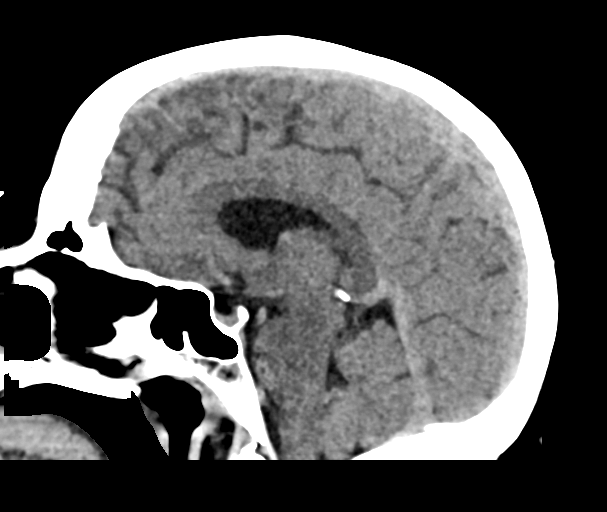
[im 35/53  brain]
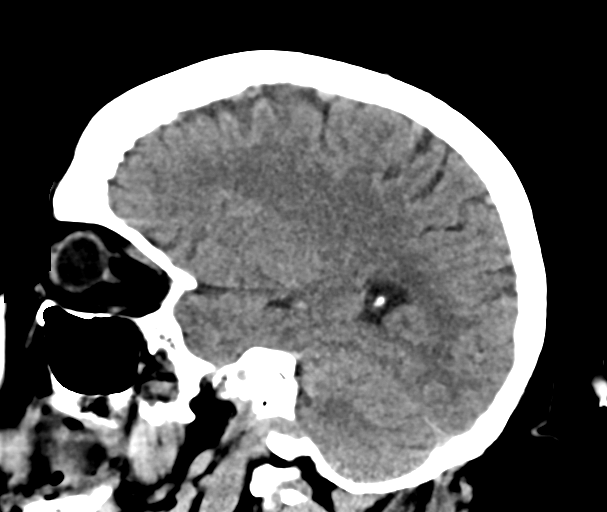

[17 of 47 positions shown; findings below may reference images not displayed]

FINDINGS: Brain: No evidence of acute infarction, hemorrhage, hydrocephalus,
extra-axial collection or mass lesion/mass effect.

Mild periventricular white matter change likely reflects small
vessel ischemic microangiopathy.

The posterior fossa, including the cerebellum, brainstem and fourth
ventricle, is within normal limits. The third and lateral
ventricles, and basal ganglia are unremarkable in appearance. The
cerebral hemispheres are symmetric in appearance, with normal
gray-white differentiation. No mass effect or midline shift is seen.

Vascular: No hyperdense vessel or unexpected calcification.

Skull: There is no evidence of fracture; visualized osseous
structures are unremarkable in appearance.

Sinuses/Orbits: The orbits are within normal limits. The paranasal
sinuses and mastoid air cells are well-aerated.

Other: No significant soft tissue abnormalities are seen.
IMPRESSION: 1. No acute intracranial pathology seen on CT.
2. Mild small vessel ischemic microangiopathy.

## 2019-04-27 NOTE — Progress Notes (Signed)
Office: [phone]  /  Fax: [phone]    Date: May 10, 2019   Appointment Start Time: 4:01pm Duration: 30 minutes Provider: Glennie Isle, Psy.D. Type of Session: Individual Therapy  Location of Patient: Home Location of Provider: Provider's Home Type of Contact: Telepsychological Visit via [place] Telephone call  Session Content: Morgan Odonnell is a 69 y.o. female presenting via [place] for a follow-up appointment to address the previously established treatment goal of decreasing emotional eating. Today's appointment was a telepsychological visit due to COVID-19. Morgan Odonnell provided verbal consent for today's telepsychological appointment and she is aware she is responsible for securing confidentiality on her end of the session. Prior to proceeding with today's appointment, Morgan Odonnell's physical location at the time of this appointment was obtained as well a phone number she could be reached at Morgan the event of technical difficulties. Morgan Odonnell and this provider participated Morgan today's telepsychological service. Of note, Morgan Odonnell reported her granddaughter and husband are "Morgan and out" of the room. Limitations of confidentiality were reviewed and Morgan Odonnell agreed to proceed, adding, "I don't mind."  Morgan Odonnell, today's appointment was switched to a regular telephone call at 4:09pm due to an unstable connection on Luna's end.   This provider conducted a brief check-Morgan. Morgan Odonnell reported she gained two pounds. She further shared there is more swelling on one side, noting she discussed it with Dr. Juleen China. She further stated, "Eating is going great," adding a reduction Morgan hunger and emotional eating. Positive reinforcement was provided. Morgan Odonnell discussed her family is very supportive and she shared she is fitting into older clothes. Morgan Odonnell, Morgan Odonnell acknowledged she is not sleeping well. She believes her pets are contributing to her sleeping difficulties. Psychoeducation  regarding grounding techniques was provided and she was led through an exercise (5-4-3-2-1) to assist with coping. Her experience was processed. Morgan Odonnell provided verbal consent during today's appointment for this provider to send a handout for today's exercise via e-mail. Furthermore, termination planning was discussed. Morgan Odonnell was receptive to a follow-up appointment Morgan 3-4 weeks and an additional follow-up/termination appointment Morgan 3-4 weeks after that. Overall, Morgan Odonnell was receptive to today's appointment as evidenced by openness to sharing, responsiveness to feedback, and willingness to engage Morgan learned skills.  Mental Status Examination:  Appearance: well groomed and appropriate hygiene  Behavior: appropriate to circumstances Mood: euthymic Affect: mood congruent Speech: normal Morgan rate, volume, and tone Eye Contact: appropriate Psychomotor Activity: appropriate Gait: unable to assess Thought Process: linear, logical, and goal directed  Thought Content/Perception: no hallucinations, delusions, bizarre thinking or behavior reported or observed and no evidence of suicidal and homicidal ideation, plan, and intent Orientation: time, person, place and purpose of appointment Memory/Concentration: memory, attention, language, and fund of knowledge intact  Insight/Judgment: good  Interventions:  Conducted a brief chart review Provided empathic reflections and validation Provided positive reinforcement Employed supportive psychotherapy interventions to facilitate reduced distress and to improve coping skills with identified stressors Employed motivational interviewing skills to assess patient's willingness/desire to adhere to recommended medical treatments and assignments Discussed termination planning Psychoeducation provided regarding grounding technique  Engaged patient Morgan a grounding technique  DSM-5 Diagnosis: 311 (F32.8) Other Specified Depressive Disorder, Emotional Eating  Behaviors  Treatment Goal & Progress: During the initial appointment with this provider, the following treatment goal was established: decrease emotional eating. Sahira has demonstrated progress Morgan her goal as evidenced by increased awareness of hunger patterns, increased awareness of triggers for emotional eating and reduction Morgan emotional eating. Amaryah also continues to demonstrate  willingness to engage Morgan learned skill(s).  Plan: The next appointment will be scheduled Morgan three weeks, which will be via [employer]. The next session will focus on working towards the established treatment goal.

## 2019-05-10 ENCOUNTER — Other Ambulatory Visit: Payer: Self-pay

## 2019-05-10 ENCOUNTER — Ambulatory Visit (INDEPENDENT_AMBULATORY_CARE_PROVIDER_SITE_OTHER): Payer: Medicare Other | Admitting: Family Medicine

## 2019-05-10 ENCOUNTER — Encounter (INDEPENDENT_AMBULATORY_CARE_PROVIDER_SITE_OTHER): Payer: Self-pay | Admitting: Family Medicine

## 2019-05-10 ENCOUNTER — Ambulatory Visit (INDEPENDENT_AMBULATORY_CARE_PROVIDER_SITE_OTHER): Payer: Medicare Other | Admitting: Psychology

## 2019-05-10 VITALS — BP 152/79 | HR 69 | Temp 98.2°F | Ht 62.0 in | Wt 302.0 lb

## 2019-05-10 DIAGNOSIS — E88819 Insulin resistance, unspecified: Secondary | ICD-10-CM

## 2019-05-10 DIAGNOSIS — Z6841 Body Mass Index (BMI) 40.0 and over, adult: Secondary | ICD-10-CM

## 2019-05-10 DIAGNOSIS — E559 Vitamin D deficiency, unspecified: Secondary | ICD-10-CM

## 2019-05-10 DIAGNOSIS — F3289 Other specified depressive episodes: Secondary | ICD-10-CM | POA: Diagnosis not present

## 2019-05-10 DIAGNOSIS — J449 Chronic obstructive pulmonary disease, unspecified: Secondary | ICD-10-CM

## 2019-05-10 DIAGNOSIS — E8881 Metabolic syndrome: Secondary | ICD-10-CM | POA: Diagnosis not present

## 2019-05-10 DIAGNOSIS — I10 Essential (primary) hypertension: Secondary | ICD-10-CM | POA: Diagnosis not present

## 2019-05-10 MED ORDER — VITAMIN D (ERGOCALCIFEROL) 1.25 MG (50000 UNIT) PO CAPS: 50000.0000 [IU] | ORAL_CAPSULE | ORAL | 0 refills | Status: DC

## 2019-05-10 NOTE — Progress Notes (Signed)
Chief Complaint:   OBESITY Morgan Odonnell is here to discuss her progress with her obesity treatment plan along with follow-up of her obesity related diagnoses. Morgan Odonnell is on following a lower carbohydrate, vegetable and lean protein rich diet plan and states she is following her eating plan approximately 95% of the time. Morgan Odonnell states she is exercising for 0 minutes 0 times per week.  Today's visit was #: 8 Starting weight: 322 lbs Starting date: 01/13/2019 Today's weight: 302 lbs Today's date: 05/10/2019 Total lbs lost to date: 20 lbs Total lbs lost since last in-office visit: 0  Interim History: Morgan Odonnell is moving better and is able to do may more ADLs. Breakfast:  3 eggs, spinach, cheese (1 ounce), and sometimes 1 tsp oil/butter, 2 yogurts (80 calories). Lunch:  Meals on Wheels - She will just eat 3 ounces of the meat and the vegetables, 1 yogurt. Dinner:  Eats with family.  No fried food.  Subjective:   1. Vitamin D deficiency Morgan Odonnell's Vitamin D level was 8.0 on 01/13/2019. She is currently taking vit D. She denies nausea, vomiting or muscle weakness.  2. Insulin resistance Morgan Odonnell has a diagnosis of insulin resistance based on her elevated fasting insulin level >5. She continues to work on diet and exercise to decrease her risk of diabetes.  Lab Results  Component Value Date   INSULIN 24.5 01/13/2019   Lab Results  Component Value Date   HGBA1C 5.4 01/13/2019   3. Chronic obstructive pulmonary disease, unspecified COPD type ([place]) Morgan Odonnell is oxygen dependent.  She has been moving more.  4. Essential hypertension Review: taking medications as instructed, no medication side effects noted, no chest pain on exertion, no dyspnea on exertion, no swelling of ankles.  Morgan Odonnell is followed by Cardiology.  She takes metoprolol and Lasix/K.  BP Readings from Last 3 Encounters:  05/10/19 (!) 152/79  04/21/19 131/77  04/07/19 (!) 175/69   5. Other depression, with  emotional eating Morgan Odonnell is struggling with emotional eating and using food for comfort to the extent that it is negatively impacting her health. She has been working on behavior modification techniques to help reduce her emotional eating and has been unsuccessful. She shows no sign of suicidal or homicidal ideations.  She takes Wellbutirn 100 mg twice daily and Celexa.  Assessment/Plan:   1. Vitamin D deficiency Low Vitamin D level contributes to fatigue and are associated with obesity, breast, and colon cancer. She agrees to continue to take prescription Vitamin D @50 ,000 IU every week and will follow-up for routine testing of Vitamin D, at least 2-3 times per year to avoid over-replacement.  Orders - Vitamin D, Ergocalciferol, (DRISDOL) 1.25 MG (50000 UNIT) CAPS capsule; Take 1 capsule (50,000 Units total) by mouth every 7 (seven) days.  Dispense: 12 capsule; Refill: 0  2. Insulin resistance Morgan Odonnell will continue to work on weight loss, exercise, and decreasing simple carbohydrates to help decrease the risk of diabetes. Morgan Odonnell agreed to follow-up with [name] as directed to closely monitor her progress.  3. Chronic obstructive pulmonary disease, unspecified COPD type ([place]) Current treatment plan is effective, no change in therapy. We will continue to monitor. Orders and follow up as documented in patient record.  4. Essential hypertension Morgan Odonnell will decrease the salt in her diet.  5. Other depression, with emotional eating Behavior modification techniques were discussed today to help Morgan Odonnell deal with her emotional/non-hunger eating behaviors.  Orders and follow up as documented in patient record.   6. Class 3  severe obesity with serious comorbidity and body mass index (BMI) of 50.0 to 59.9 in adult, unspecified obesity type (HCC) Morgan Odonnell is currently in the action stage of change. As such, her goal is to continue with weight loss efforts. She has agreed to following a lower  carbohydrate, vegetable and lean protein rich diet plan.   Exercise goals: Older adults should follow the adult guidelines. When older adults cannot meet the adult guidelines, they should be as physically active as their abilities and conditions will allow.  Older adults should do exercises that maintain or improve balance if they are at risk of falling.   Behavioral modification strategies: increasing lean protein intake, decreasing simple carbohydrates and decreasing sodium intake.  Morgan Odonnell has agreed to follow-up with our clinic in 2 weeks. She was informed of the importance of frequent follow-up visits to maximize her success with intensive lifestyle modifications for her multiple health conditions.   Objective:   Blood pressure (!) 152/79, pulse 69, temperature 98.2 F (36.8 C), temperature source Oral, height 5\' 2"  (1.575 m), weight (!) 302 lb (137 kg), SpO2 94 %. Body mass index is 55.24 kg/m.  General: Cooperative, alert, well developed, in no acute distress. HEENT: Conjunctivae and lids unremarkable. Cardiovascular: Regular rhythm.  Lungs: Normal work of breathing. Neurologic: No focal deficits.   Lab Results  Component Value Date   CREATININE 0.96 01/13/2019   BUN 15 01/13/2019   NA 139 01/13/2019   K 4.0 01/13/2019   CL 98 01/13/2019   CO2 28 01/13/2019   Lab Results  Component Value Date   ALT 17 01/13/2019   AST 28 01/13/2019   ALKPHOS 166 (H) 01/13/2019   BILITOT 0.6 01/13/2019   Lab Results  Component Value Date   HGBA1C 5.4 01/13/2019   Lab Results  Component Value Date   INSULIN 24.5 01/13/2019   Lab Results  Component Value Date   TSH 1.460 03/24/2019   Lab Results  Component Value Date   CHOL 133 01/13/2019   HDL 39 (L) 01/13/2019   LDLCALC 79 01/13/2019   TRIG 78 01/13/2019   Lab Results  Component Value Date   WBC 9.0 01/13/2019   HGB 12.6 01/13/2019   HCT 39.4 01/13/2019   MCV 83 01/13/2019   PLT 227 01/13/2019   Lab Results   Component Value Date   IRON 63 01/13/2019   TIBC 383 01/13/2019   FERRITIN 36 01/13/2019   Obesity Behavioral Intervention:   Approximately 15 minutes were spent on the discussion below.  ASK: We discussed the diagnosis of obesity with [name] today and [name] agreed to give [name] permission to discuss obesity behavioral modification therapy today.  ASSESS: Raigen has the diagnosis of obesity and her BMI today is 54.86 . Tristana is in the action stage of change.   ADVISE: Alleen was educated on the multiple health risks of obesity as well as the benefit of weight loss to improve her health. She was advised of the need for long term treatment and the importance of lifestyle modifications to improve her current health and to decrease her risk of future health problems.  AGREE: Multiple dietary modification options and treatment options were discussed and Jodelle agreed to follow the recommendations documented in the above note.  ARRANGE: Devynn was educated on the importance of frequent visits to treat obesity as outlined per CMS and USPSTF guidelines and agreed to schedule her next follow up appointment today.  Attestation Statements:   Reviewed by clinician on day of  visit: allergies, medications, problem list, medical history, surgical history, family history, social history, and previous encounter notes.  I, Water quality scientist, CMA, am acting as Location manager for [employer], DO.  I have reviewed the above documentation for accuracy and completeness, and I agree with the above. Briscoe Deutscher, DO

## 2019-05-11 LAB — HEMOGLOBIN A1C
Est. average glucose Bld gHb Est-mCnc: 103 mg/dL
Hgb A1c MFr Bld: 5.2 % (ref 4.8–5.6)

## 2019-05-11 LAB — COMPREHENSIVE METABOLIC PANEL
ALT: 40 IU/L — ABNORMAL HIGH (ref 0–32)
AST: 49 IU/L — ABNORMAL HIGH (ref 0–40)
Albumin/Globulin Ratio: 1.2 (ref 1.2–2.2)
Albumin: 4.1 g/dL (ref 3.8–4.8)
Alkaline Phosphatase: 117 IU/L (ref 39–117)
BUN/Creatinine Ratio: 24 (ref 12–28)
BUN: 24 mg/dL (ref 8–27)
Bilirubin Total: 0.5 mg/dL (ref 0.0–1.2)
CO2: 27 mmol/L (ref 20–29)
Calcium: 9.2 mg/dL (ref 8.7–10.3)
Chloride: 95 mmol/L — ABNORMAL LOW (ref 96–106)
Creatinine, Ser: 1 mg/dL (ref 0.57–1.00)
GFR calc Af Amer: 68 mL/min/{1.73_m2} (ref >59)
GFR calc non Af Amer: 59 mL/min/{1.73_m2} — ABNORMAL LOW (ref >59)
Globulin, Total: 3.4 g/dL (ref 1.5–4.5)
Glucose: 84 mg/dL (ref 65–99)
Potassium: 4.4 mmol/L (ref 3.5–5.2)
Sodium: 139 mmol/L (ref 134–144)
Total Protein: 7.5 g/dL (ref 6.0–8.5)

## 2019-05-11 LAB — CBC WITH DIFFERENTIAL/PLATELET
Basophils Absolute: 0.1 10*3/uL (ref 0.0–0.2)
Basos: 1 %
EOS (ABSOLUTE): 0.3 10*3/uL (ref 0.0–0.4)
Eos: 3 %
Hematocrit: 42.9 % (ref 34.0–46.6)
Hemoglobin: 13.3 g/dL (ref 11.1–15.9)
Immature Grans (Abs): 0 10*3/uL (ref 0.0–0.1)
Immature Granulocytes: 0 %
Lymphocytes Absolute: 2.6 10*3/uL (ref 0.7–3.1)
Lymphs: 28 %
MCH: 25.9 pg — ABNORMAL LOW (ref 26.6–33.0)
MCHC: 31 g/dL — ABNORMAL LOW (ref 31.5–35.7)
MCV: 84 fL (ref 79–97)
Monocytes Absolute: 1 10*3/uL — ABNORMAL HIGH (ref 0.1–0.9)
Monocytes: 11 %
Neutrophils Absolute: 5.3 10*3/uL (ref 1.4–7.0)
Neutrophils: 57 %
Platelets: 242 10*3/uL (ref 150–450)
RBC: 5.13 x10E6/uL (ref 3.77–5.28)
RDW: 14.2 % (ref 11.7–15.4)
WBC: 9.3 10*3/uL (ref 3.4–10.8)

## 2019-05-11 LAB — LIPID PANEL WITH LDL/HDL RATIO
Cholesterol, Total: 121 mg/dL (ref 100–199)
HDL: 42 mg/dL (ref >39)
LDL Chol Calc (NIH): 66 mg/dL (ref 0–99)
LDL/HDL Ratio: 1.6 ratio (ref 0.0–3.2)
Triglycerides: 62 mg/dL (ref 0–149)
VLDL Cholesterol Cal: 13 mg/dL (ref 5–40)

## 2019-05-11 LAB — INSULIN, RANDOM: INSULIN: 17 u[IU]/mL (ref 2.6–24.9)

## 2019-05-11 LAB — VITAMIN B12: Vitamin B-12: 1067 pg/mL (ref 232–1245)

## 2019-05-11 LAB — VITAMIN D 25 HYDROXY (VIT D DEFICIENCY, FRACTURES): Vit D, 25-Hydroxy: 55.4 ng/mL (ref 30.0–100.0)

## 2019-05-17 NOTE — Progress Notes (Signed)
Office: [phone]  /  Fax: [phone]    Date: May 31, 2019   Appointment Start Time: 2:05pm Duration: 21 minutes Provider: Glennie Isle, Psy.D. Type of Session: Individual Therapy  Location of Patient: Home Location of Provider: Provider's Home Type of Contact: Telepsychological Visit via Telephone Call  Session Content:This provider called Lissa at 2:01pm to assist with connecting. The e-mail with the secure link was re-sent; however, Mais noted she did not receive it. As such, today's appointment was initiated 5 minutes late via telephone call. Josetta is a 69 y.o. female presenting via Telephone Call for a follow-up appointment to address the previously established treatment goal of decreasing emotional eating. Today's appointment was a telepsychological visit due to COVID-19. Bell[place]ia provided verbal consent for today's telepsychological appointment and she is aware she is responsible for securing confidentiality on her end of the session. Prior to proceeding with today's appointment, Shawonda's physical location at the time of this appointment was obtained as well a phone number she could be reached at in the event of technical difficulties. Tilla and this provider participated in today's telepsychological service.   This provider conducted a brief check-in. Gudalupe shared about recent events, including her walking from the car to the clinic during her last appointment at the clinic. She also shared she continues to lose weight. Additionally, she discussed a plan to go to the beach next week for a "day or two." This provider discussed traveling and her eating habits. Fernande expressed confidence in her ability to eat congruent to her meal plan, as her daughter reportedly watches what she eats. This provider and Taraoluwa also discussed strategies (e.g., taking snacks on the drive, taking certain foods for the stay) to help her while she is away. Session then  focused on her sleeping habits. She noted an improvement, adding she is using soothing sounds to sleep. However, she indicated she continues to have challenges falling asleep if she is woken up. As such, the 5-4-3-2-1 exercise was reviewed. She noted she did not receive the e-mail. This provider sent Toshie a MyChart message with a handout for the exercise. Prior to sending the message, this provider explained the message would be visible to all providers, as it would be part of the electronic medical record. Kili verbally acknowledged understanding, and verbally consented to this provider sending the MyChart message. Saana was receptive to today's appointment as evidenced by openness to sharing, responsiveness to feedback, and willingness to continue engaging in learned skills.   Mental Status Examination:  Appearance: unable to assess  Behavior: unable to assess Mood: euthymic Affect: unable to fully assess Speech: normal in rate, volume, and tone Eye Contact: unable to assess Psychomotor Activity: unable to assess  Gait: unable to assess Thought Process: linear, logical, and goal directed  Thought Content/Perception: no hallucinations, delusions, bizarre thinking or behavior reported or observed and no evidence of suicidal and homicidal ideation, plan, and intent Orientation: time, person, place and purpose of appointment Memory/Concentration: memory, attention, language, and fund of knowledge intact  Insight/Judgment: good  Interventions:  Conducted a brief chart review Provided empathic reflections and validation Reviewed content from the previous session Employed supportive psychotherapy interventions to facilitate reduced distress and to improve coping skills with identified stressors Employed motivational interviewing skills to assess patient's willingness/desire to adhere to recommended medical treatments and assignments  DSM-5 Diagnosis(es): 311 (F32.8) Other Specified  Depressive Disorder, Emotional Eating Behaviors  Treatment Goal & Progress: During the initial appointment with this provider, the following treatment goal  was established: decrease emotional eating. Sybilla demonstrated progress in her goal as evidenced by increased awareness of hunger patterns, increased awareness of triggers for emotional eating and reduction in emotional eating. Richetta also continues to demonstrate willingness to engage in learned skill(s).  Plan: Based on Latana's continued progress and her request, today was Fortune's last appointment with this provider. She acknowledged understanding that she may request a follow-up appointment with this provider in the future as long as she is still established with the clinic. No further follow-up planned by this provider.

## 2019-05-22 ENCOUNTER — Other Ambulatory Visit (INDEPENDENT_AMBULATORY_CARE_PROVIDER_SITE_OTHER): Payer: Self-pay | Admitting: Family Medicine

## 2019-05-24 ENCOUNTER — Encounter (INDEPENDENT_AMBULATORY_CARE_PROVIDER_SITE_OTHER): Payer: Self-pay | Admitting: Family Medicine

## 2019-05-24 ENCOUNTER — Other Ambulatory Visit: Payer: Self-pay

## 2019-05-24 ENCOUNTER — Ambulatory Visit (INDEPENDENT_AMBULATORY_CARE_PROVIDER_SITE_OTHER): Payer: Medicare Other | Admitting: Family Medicine

## 2019-05-24 VITALS — BP 120/62 | HR 68 | Temp 98.7°F | Ht 62.0 in | Wt 290.0 lb

## 2019-05-24 DIAGNOSIS — E559 Vitamin D deficiency, unspecified: Secondary | ICD-10-CM | POA: Diagnosis not present

## 2019-05-24 DIAGNOSIS — Z6841 Body Mass Index (BMI) 40.0 and over, adult: Secondary | ICD-10-CM | POA: Diagnosis not present

## 2019-05-24 DIAGNOSIS — R7989 Other specified abnormal findings of blood chemistry: Secondary | ICD-10-CM

## 2019-05-24 DIAGNOSIS — F3289 Other specified depressive episodes: Secondary | ICD-10-CM | POA: Diagnosis not present

## 2019-05-24 DIAGNOSIS — E8881 Metabolic syndrome: Secondary | ICD-10-CM

## 2019-05-25 NOTE — Progress Notes (Signed)
Chief Complaint:   OBESITY Morgan Odonnell is here to discuss her progress with her obesity treatment plan along with follow-up of her obesity related diagnoses. Morgan Odonnell is on following a lower carbohydrate, vegetable and lean protein rich diet plan and states she is following her eating plan approximately 90% of the time. Morgan Odonnell states she is exercising for 0 minutes 0 times per week.  Today's visit was #: 9 Starting weight: 322 lbs Starting date: 01/13/2019 Today's weight: 290 lbs Today's date: 05/24/2019 Total lbs lost to date: 32 lbs Total lbs lost since last in-office visit: 12 lbs  Interim History: Morgan Odonnell is following the low carb plan well.  She is able to shower on her own now.  Today, the patient was able to walk from her car into the office without using her wheelchair!   Subjective:   1. Insulin resistance Morgan Odonnell has a diagnosis of insulin resistance based on her elevated fasting insulin level >5. She continues to work on diet and exercise to decrease her risk of diabetes.  Lab Results  Component Value Date   INSULIN 17.0 05/10/2019   INSULIN 24.5 01/13/2019   Lab Results  Component Value Date   HGBA1C 5.2 05/10/2019   2. Vitamin D deficiency Morgan Odonnell's Vitamin D level was 55.4 on 05/10/2019. She is currently taking vit D. She denies nausea, vomiting or muscle weakness.  Vitamin D level is currently at goal.  3. Elevated LFTs Morgan Odonnell has a new dx of elevated ALT. Her BMI is over 40. She denies abdominal pain or jaundice and has never been told of any liver problems in the past. She denies excessive alcohol intake.  This is a new diagnosis and it is mild.  Lab Results  Component Value Date   ALT 40 (H) 05/10/2019   AST 49 (H) 05/10/2019   ALKPHOS 117 05/10/2019   BILITOT 0.5 05/10/2019   4. Other depression, with emotional eating Morgan Odonnell is struggling with emotional eating and using food for comfort to the extent that it is negatively impacting her  health. She has been working on behavior modification techniques to help reduce her emotional eating and has been unsuccessful. She shows no sign of suicidal or homicidal ideations.  She is taking Wellbutrin and Celexa.  Assessment/Plan:   1. Insulin resistance [name] will continue to work on weight loss, exercise, and decreasing simple carbohydrates to help decrease the risk of diabetes. Morgan Odonnell agreed to follow-up with [name] as directed to closely monitor her progress.  2. Vitamin D deficiency Low Vitamin D level contributes to fatigue and are associated with obesity, breast, and colon cancer. She agrees to continue to take prescription Vitamin D @50 ,000 IU every week and will follow-up for routine testing of Vitamin D, at least 2-3 times per year to avoid over-replacement.  3. Elevated LFTs We discussed the likely diagnosis of non-alcoholic fatty liver disease today and how this condition is obesity related. Morgan Odonnell was educated the importance of weight loss. Morgan Odonnell agreed to continue with her weight loss efforts with healthier diet and exercise as an essential part of her treatment plan. We will recheck labs at next visit.  4. Other depression, with emotional eating Behavior modification techniques were discussed today to help Morgan Odonnell deal with her emotional/non-hunger eating behaviors.  Orders and follow up as documented in patient record.   5. Class 3 severe obesity with serious comorbidity and body mass index (BMI) of 50.0 to 59.9 in adult, unspecified obesity type (HCC) Morgan Odonnell is currently in the  action stage of change. As such, her goal is to continue with weight loss efforts. She has agreed to following a lower carbohydrate, vegetable and lean protein rich diet plan.   Exercise goals: All adults should avoid inactivity. Some physical activity is better than none, and adults who participate in any amount of physical activity gain some health benefits. She declines  PT  Behavioral modification strategies: increasing lean protein intake and increasing high fiber foods.  Morgan Odonnell has agreed to follow-up with our clinic in 2 weeks. She was informed of the importance of frequent follow-up visits to maximize her success with intensive lifestyle modifications for her multiple health conditions.   Objective:   Blood pressure 120/62, pulse 68, temperature 98.7 F (37.1 C), temperature source Oral, height 5\' 2"  (1.575 m), weight 290 lb (131.5 kg), SpO2 94 %. Body mass index is 53.04 kg/m.  General: Cooperative, alert, well developed, in no acute distress. HEENT: Conjunctivae and lids unremarkable. Cardiovascular: Regular rhythm.  Lungs: Normal work of breathing. Neurologic: No focal deficits.   Lab Results  Component Value Date   CREATININE 1.00 05/10/2019   BUN 24 05/10/2019   NA 139 05/10/2019   K 4.4 05/10/2019   CL 95 (L) 05/10/2019   CO2 27 05/10/2019   Lab Results  Component Value Date   ALT 40 (H) 05/10/2019   AST 49 (H) 05/10/2019   ALKPHOS 117 05/10/2019   BILITOT 0.5 05/10/2019   Lab Results  Component Value Date   HGBA1C 5.2 05/10/2019   HGBA1C 5.4 01/13/2019   Lab Results  Component Value Date   INSULIN 17.0 05/10/2019   INSULIN 24.5 01/13/2019   Lab Results  Component Value Date   TSH 1.460 03/24/2019   Lab Results  Component Value Date   CHOL 121 05/10/2019   HDL 42 05/10/2019   LDLCALC 66 05/10/2019   TRIG 62 05/10/2019   Lab Results  Component Value Date   WBC 9.3 05/10/2019   HGB 13.3 05/10/2019   HCT 42.9 05/10/2019   MCV 84 05/10/2019   PLT 242 05/10/2019   Lab Results  Component Value Date   IRON 63 01/13/2019   TIBC 383 01/13/2019   FERRITIN 36 01/13/2019   Obesity Behavioral Intervention:   Approximately 15 minutes were spent on the discussion below.  ASK: We discussed the diagnosis of obesity with Morgan Odonnell today and Morgan Odonnell agreed to give [name] permission to discuss obesity behavioral  modification therapy today.  ASSESS: Morgan Odonnell has the diagnosis of obesity and her BMI today is 55.24. Morgan Odonnell is in the action stage of change.   ADVISE: Morgan Odonnell was educated on the multiple health risks of obesity as well as the benefit of weight loss to improve her health. She was advised of the need for long term treatment and the importance of lifestyle modifications to improve her current health and to decrease her risk of future health problems.  AGREE: Multiple dietary modification options and treatment options were discussed and Morgan Odonnell agreed to follow the recommendations documented in the above note.  ARRANGE: Morgan Odonnell was educated on the importance of frequent visits to treat obesity as outlined per CMS and USPSTF guidelines and agreed to schedule her next follow up appointment today.  Attestation Statements:   Reviewed by clinician on day of visit: allergies, medications, problem list, medical history, surgical history, family history, social history, and previous encounter notes.  I, Water quality scientist, CMA, am acting as Location manager for [employer], DO.  I have reviewed the above documentation  for accuracy and completeness, and I agree with the above. Briscoe Deutscher, DO

## 2019-05-31 ENCOUNTER — Ambulatory Visit (INDEPENDENT_AMBULATORY_CARE_PROVIDER_SITE_OTHER): Payer: Medicare Other | Admitting: Psychology

## 2019-05-31 ENCOUNTER — Other Ambulatory Visit: Payer: Self-pay

## 2019-05-31 ENCOUNTER — Encounter (INDEPENDENT_AMBULATORY_CARE_PROVIDER_SITE_OTHER): Payer: Self-pay

## 2019-05-31 DIAGNOSIS — F3289 Other specified depressive episodes: Secondary | ICD-10-CM

## 2019-05-31 NOTE — Progress Notes (Deleted)
Office: [phone]  /  Fax: [phone]    Date: May 31, 2019    Appointment Start Time: *** Duration: *** minutes Provider: Glennie Isle, Psy.D. Type of Session: Individual Therapy  Location of Patient: {gbptloc:23249} Location of Provider: {Location of Service:22491} Type of Contact: Telepsychological Visit via {gbtelepsych:23399}  Session Content: Morgan Odonnell is a 69 y.o. female presenting via {gbtelepsych:23399} for a follow-up appointment to address the previously established treatment goal of decreasing emotional eating. Today's appointment was a telepsychological visit due to COVID-19. Desira provided verbal consent for today's telepsychological appointment and she is aware she is responsible for securing confidentiality on her end of the session. Prior to proceeding with today's appointment, Morgan Odonnell's physical location at the time of this appointment was obtained as well a phone number she could be reached at in the event of technical difficulties. Arlie and this provider participated in today's telepsychological service.   This provider conducted a brief check-in and verbally administered the PHQ-9 and GAD-7. *** Morgan Odonnell was receptive to today's appointment as evidenced by openness to sharing, responsiveness to feedback, and {gbreceptiveness:23401}.  Mental Status Examination:  Appearance: {Appearance:22431} Behavior: {Behavior:22445} Mood: {gbmood:21757} Affect: {Affect:22436} Speech: {Speech:22432} Eye Contact: {Eye Contact:22433} Psychomotor Activity: {Motor Activity:22434} Gait: {gbgait:23404} Thought Process: {thought process:22448}  Thought Content/Perception: {disturbances:22451} Orientation: {Orientation:22437} Memory/Concentration: {gbcognition:22449} Insight/Judgment: {Insight:22446}  Structured Assessments Results: The Patient Health Questionnaire-9 (PHQ-9) is a self-report measure that assesses symptoms and severity of depression over the course  of the last two weeks. Morgan Odonnell obtained a score of *** suggesting {GBPHQ9SEVERITY:21752}. Morgan Odonnell finds the endorsed symptoms to be {gbphq9difficulty:21754}. [0= Not at all; 1= Several days; 2= More than half the days; 3= Nearly every day] Little interest or pleasure in doing things ***  Feeling down, depressed, or hopeless ***  Trouble falling or staying asleep, or sleeping too much ***  Feeling tired or having little energy ***  Poor appetite or overeating ***  Feeling bad about yourself --- or that you are a failure or have let yourself or your family down ***  Trouble concentrating on things, such as reading the newspaper or watching television ***  Moving or speaking so slowly that other people could have noticed? Or the opposite --- being so fidgety or restless that you have been moving around a lot more than usual ***  Thoughts that you would be better off dead or hurting yourself in some way ***  PHQ-9 Score ***    The Generalized Anxiety Disorder-7 (GAD-7) is a brief self-report measure that assesses symptoms of anxiety over the course of the last two weeks. Morgan Odonnell obtained a score of *** suggesting {gbgad7severity:21753}. Morgan Odonnell finds the endorsed symptoms to be {gbphq9difficulty:21754}. [0= Not at all; 1= Several days; 2= Over half the days; 3= Nearly every day] Feeling nervous, anxious, on edge ***  Not being able to stop or control worrying ***  Worrying too much about different things ***  Trouble relaxing ***  Being so restless that it's hard to sit still ***  Becoming easily annoyed or irritable ***  Feeling afraid as if something awful might happen ***  GAD-7 Score ***   Interventions:  {Interventions for Progress Notes:23405}  DSM-5 Diagnosis(es): {Diagnoses:22752}  Treatment Goal & Progress: During the initial appointment with this provider, the following treatment goal was established: {gbtxgoals:21759}. Morgan Odonnell has demonstrated progress in her goal as  evidenced by {gbtxprogress:22839}. Morgan Odonnell also {gbtxprogress2:22951}.  Plan: The next appointment will be scheduled in {gbweeks:21758}, which will be {gbtxmodality:23402}. The next session will focus on {Plan  for Next Appointment:23400}.

## 2019-06-14 ENCOUNTER — Other Ambulatory Visit: Payer: Self-pay

## 2019-06-14 ENCOUNTER — Ambulatory Visit (INDEPENDENT_AMBULATORY_CARE_PROVIDER_SITE_OTHER): Payer: Medicare Other | Admitting: Family Medicine

## 2019-06-14 ENCOUNTER — Encounter (INDEPENDENT_AMBULATORY_CARE_PROVIDER_SITE_OTHER): Payer: Self-pay | Admitting: Family Medicine

## 2019-06-14 VITALS — BP 110/65 | HR 64 | Temp 98.6°F | Ht 62.0 in | Wt 294.0 lb

## 2019-06-14 DIAGNOSIS — Z6841 Body Mass Index (BMI) 40.0 and over, adult: Secondary | ICD-10-CM

## 2019-06-14 DIAGNOSIS — M25561 Pain in right knee: Secondary | ICD-10-CM | POA: Diagnosis not present

## 2019-06-14 DIAGNOSIS — R7989 Other specified abnormal findings of blood chemistry: Secondary | ICD-10-CM

## 2019-06-14 DIAGNOSIS — F3289 Other specified depressive episodes: Secondary | ICD-10-CM

## 2019-06-14 DIAGNOSIS — R5383 Other fatigue: Secondary | ICD-10-CM

## 2019-06-14 DIAGNOSIS — M25562 Pain in left knee: Secondary | ICD-10-CM

## 2019-06-14 DIAGNOSIS — G8929 Other chronic pain: Secondary | ICD-10-CM

## 2019-06-14 DIAGNOSIS — E8881 Metabolic syndrome: Secondary | ICD-10-CM

## 2019-06-14 NOTE — Progress Notes (Signed)
Chief Complaint:   OBESITY Morgan Odonnell is here to discuss her progress with her obesity treatment plan along with follow-up of her obesity related diagnoses. Morgan Odonnell is on following a lower carbohydrate, vegetable and lean protein rich diet plan and states she is following her eating plan approximately 90-100% of the time. Morgan Odonnell states she is walking when she can.  Today's visit was #: 10 Starting weight: 322 lbs Starting date: 01/13/2019 Today's weight: 294 lbs Today's date: 06/14/2019 Total lbs lost to date: 28 lbs Total lbs lost since last in-office visit: 0  Interim History: Morgan Odonnell reports increased fatigue due to allergies and less sleep due to knee pain.  She is still following the low carb plan well.  Subjective:   1. Elevated LFTs Pavandeep has a new dx of elevated ALT. Her BMI is over 40. She denies abdominal pain or jaundice and has never been told of any liver problems in the past. She denies excessive alcohol intake.  LFTs were rechecked by her PCP and were normal per patient.  Lab Results  Component Value Date   ALT 40 (H) 05/10/2019   AST 49 (H) 05/10/2019   ALKPHOS 117 05/10/2019   BILITOT 0.5 05/10/2019   2. Insulin resistance Morgan Odonnell has a diagnosis of insulin resistance based on her elevated fasting insulin level >5. She continues to work on diet and exercise to decrease her risk of diabetes.  Lab Results  Component Value Date   INSULIN 17.0 05/10/2019   INSULIN 24.5 01/13/2019   Lab Results  Component Value Date   HGBA1C 5.2 05/10/2019   3. Chronic pain of both knees She has history of arthroscopic cleanout bilaterally.  She has not seen Orthopedics in several years.    4. Other fatigue Morgan Odonnell looks sleepy today.  She admits to poor sleep secondary to knee pain and increased allergies.  5. Other depression, with emotional eating Morgan Odonnell is struggling with emotional eating and using food for comfort to the extent that it is negatively  impacting her health. She has been working on behavior modification techniques to help reduce her emotional eating and has been successful. She shows no sign of suicidal or homicidal ideations.  She is taking Wellbutrin and Celexa.  Assessment/Plan:   1. Elevated LFTs LFTs were rechecked by her PCP, and were normal.  2. Insulin resistance Morgan Odonnell will continue to work on weight loss, exercise, and decreasing simple carbohydrates to help decrease the risk of diabetes. Morgan Odonnell agreed to follow-up with [name] as directed to closely monitor her progress.  3. Chronic pain of both knees Offered referral to Sports Medicine for possible injections.  She would like to wait.  4. Other fatigue Will monitor.  5. Other depression, with emotional eating Behavior modification techniques were discussed today to help Morgan Odonnell deal with her emotional/non-hunger eating behaviors.  Orders and follow up as documented in patient record.   6. Class 3 severe obesity with serious comorbidity and body mass index (BMI) of 50.0 to 59.9 in adult, unspecified obesity type ([facility]) Morgan Odonnell is currently in the action stage of change. As such, her goal is to continue with weight loss efforts. She has agreed to following a lower carbohydrate, vegetable and lean protein rich diet plan.   Exercise goals: As is.  Behavioral modification strategies: increasing lean protein intake and increasing water intake.  Morgan Odonnell has agreed to follow-up with our clinic in 2 weeks. She was informed of the importance of frequent follow-up visits to maximize her success with  intensive lifestyle modifications for her multiple health conditions.   Objective:   Blood pressure 110/65, pulse 64, temperature 98.6 F (37 C), temperature source Oral, height 5\' 2"  (1.575 m), weight 294 lb (133.4 kg). Body mass index is 53.77 kg/m.  General: Cooperative, alert, well developed, in no acute distress. HEENT: Conjunctivae and lids  unremarkable. Cardiovascular: Regular rhythm.  Lungs: Normal work of breathing. Neurologic: No focal deficits.   Lab Results  Component Value Date   CREATININE 1.00 05/10/2019   BUN 24 05/10/2019   NA 139 05/10/2019   K 4.4 05/10/2019   CL 95 (L) 05/10/2019   CO2 27 05/10/2019   Lab Results  Component Value Date   ALT 40 (H) 05/10/2019   AST 49 (H) 05/10/2019   ALKPHOS 117 05/10/2019   BILITOT 0.5 05/10/2019   Lab Results  Component Value Date   HGBA1C 5.2 05/10/2019   HGBA1C 5.4 01/13/2019   Lab Results  Component Value Date   INSULIN 17.0 05/10/2019   INSULIN 24.5 01/13/2019   Lab Results  Component Value Date   TSH 1.460 03/24/2019   Lab Results  Component Value Date   CHOL 121 05/10/2019   HDL 42 05/10/2019   LDLCALC 66 05/10/2019   TRIG 62 05/10/2019   Lab Results  Component Value Date   WBC 9.3 05/10/2019   HGB 13.3 05/10/2019   HCT 42.9 05/10/2019   MCV 84 05/10/2019   PLT 242 05/10/2019   Lab Results  Component Value Date   IRON 63 01/13/2019   TIBC 383 01/13/2019   FERRITIN 36 01/13/2019    Obesity Behavioral Intervention:   Approximately 15 minutes were spent on the discussion below.  ASK: We discussed the diagnosis of obesity with Morgan Odonnell today and Morgan Odonnell agreed to give [name] permission to discuss obesity behavioral modification therapy today.  ASSESS: Morgan Odonnell has the diagnosis of obesity and her BMI today is 53.04. Morgan Odonnell is in the action stage of change.   ADVISE: Morgan Odonnell was educated on the multiple health risks of obesity as well as the benefit of weight loss to improve her health. She was advised of the need for long term treatment and the importance of lifestyle modifications to improve her current health and to decrease her risk of future health problems.  AGREE: Multiple dietary modification options and treatment options were discussed and Morgan Odonnell agreed to follow the recommendations documented in the above  note.  ARRANGE: Morgan Odonnell was educated on the importance of frequent visits to treat obesity as outlined per CMS and USPSTF guidelines and agreed to schedule her next follow up appointment today.  Attestation Statements:   Reviewed by clinician on day of visit: allergies, medications, problem list, medical history, surgical history, family history, social history, and previous encounter notes.  I, Water quality scientist, CMA, am acting as Location manager for [employer], DO.  I have reviewed the above documentation for accuracy and completeness, and I agree with the above. Briscoe Deutscher, DO

## 2019-07-05 ENCOUNTER — Other Ambulatory Visit: Payer: Self-pay

## 2019-07-05 ENCOUNTER — Encounter (INDEPENDENT_AMBULATORY_CARE_PROVIDER_SITE_OTHER): Payer: Self-pay | Admitting: Family Medicine

## 2019-07-05 ENCOUNTER — Ambulatory Visit (INDEPENDENT_AMBULATORY_CARE_PROVIDER_SITE_OTHER): Payer: Medicare Other | Admitting: Family Medicine

## 2019-07-05 VITALS — BP 106/67 | HR 67 | Temp 98.3°F | Ht 62.0 in | Wt 288.0 lb

## 2019-07-05 DIAGNOSIS — Z6841 Body Mass Index (BMI) 40.0 and over, adult: Secondary | ICD-10-CM | POA: Diagnosis not present

## 2019-07-05 DIAGNOSIS — E038 Other specified hypothyroidism: Secondary | ICD-10-CM | POA: Diagnosis not present

## 2019-07-05 DIAGNOSIS — M25561 Pain in right knee: Secondary | ICD-10-CM | POA: Diagnosis not present

## 2019-07-05 DIAGNOSIS — M25562 Pain in left knee: Secondary | ICD-10-CM

## 2019-07-06 LAB — COMPREHENSIVE METABOLIC PANEL
ALT: 28 IU/L (ref 0–32)
AST: 36 IU/L (ref 0–40)
Albumin/Globulin Ratio: 1.2 (ref 1.2–2.2)
Albumin: 3.8 g/dL (ref 3.8–4.8)
Alkaline Phosphatase: 96 IU/L (ref 39–117)
BUN/Creatinine Ratio: 27 (ref 12–28)
BUN: 22 mg/dL (ref 8–27)
Bilirubin Total: 0.7 mg/dL (ref 0.0–1.2)
CO2: 29 mmol/L (ref 20–29)
Calcium: 9.4 mg/dL (ref 8.7–10.3)
Chloride: 98 mmol/L (ref 96–106)
Creatinine, Ser: 0.83 mg/dL (ref 0.57–1.00)
GFR calc Af Amer: 84 mL/min/{1.73_m2} (ref >59)
GFR calc non Af Amer: 73 mL/min/{1.73_m2} (ref >59)
Globulin, Total: 3.2 g/dL (ref 1.5–4.5)
Glucose: 91 mg/dL (ref 65–99)
Potassium: 4.1 mmol/L (ref 3.5–5.2)
Sodium: 139 mmol/L (ref 134–144)
Total Protein: 7 g/dL (ref 6.0–8.5)

## 2019-07-06 LAB — T3: T3, Total: 104 ng/dL (ref 71–180)

## 2019-07-06 LAB — TSH: TSH: 0.441 u[IU]/mL — ABNORMAL LOW (ref 0.450–4.500)

## 2019-07-06 LAB — T4, FREE: Free T4: 2.28 ng/dL — ABNORMAL HIGH (ref 0.82–1.77)

## 2019-07-06 NOTE — Progress Notes (Signed)
Chief Complaint:   OBESITY Morgan Odonnell is here to discuss her progress with her obesity treatment plan along with follow-up of her obesity related diagnoses. Morgan Odonnell is on following a lower carbohydrate, vegetable and lean protein rich diet plan and states she is following her eating plan approximately 95% of the time. Morgan Odonnell states she is using arm weights 3 times per week.  Today's visit was #: 11 Starting weight: 322 lbs Starting date: 01/13/2019 Today's weight: 288 lbs Today's date: 07/05/2019 Total lbs lost to date: 34 lbs Total lbs lost since last in-office visit: 6 lbs  Interim History: Morgan Odonnell is down 36 pounds.  She will continue on her low carb diet.   Subjective:   1. Other specified hypothyroidism Morgan Odonnell takes levothyroxine 200 mg 4 days per week and 220 mg on the other 3 days.  She is due for labs.  Lab Results  Component Value Date   TSH 0.441 (L) 07/05/2019   2. Pain in both knees, unspecified chronicity Morgan Odonnell has pain in bilateral knees that makes it difficult for her to walk and exercise.  Assessment/Plan:   1. Other specified hypothyroidism Patient with long-standing hypothyroidism, on levothyroxine therapy. She appears euthyroid. Orders and follow up as documented in patient record.  Counseling . Good thyroid control is important for overall health. Supratherapeutic thyroid levels are dangerous and will not improve weight loss results. . The correct way to take levothyroxine is fasting, with water, separated by at least 30 minutes from breakfast, and separated by more than 4 hours from calcium, iron, multivitamins, acid reflux medications (PPIs).   Orders - Comprehensive metabolic panel - TSH - T4, free - T3  2. Pain in both knees, unspecified chronicity We discussed chair exercises today.  3. Class 3 severe obesity with serious comorbidity and body mass index (BMI) of 50.0 to 59.9 in adult, unspecified obesity type (Morgan Odonnell) Morgan Odonnell is  currently in the action stage of change. As such, her goal is to continue with weight loss efforts. She has agreed to following a lower carbohydrate, vegetable and lean protein rich diet plan.   Exercise goals: Older adults should follow the adult guidelines. When older adults cannot meet the adult guidelines, they should be as physically active as their abilities and conditions will allow.   Behavioral modification strategies: increasing lean protein intake.  Morgan Odonnell has agreed to follow-up with our clinic in 2 weeks. She was informed of the importance of frequent follow-up visits to maximize her success with intensive lifestyle modifications for her multiple health conditions.   Morgan Odonnell was informed we would discuss her lab results at her next visit unless there is a critical issue that needs to be addressed sooner. Morgan Odonnell agreed to keep her next visit at the agreed upon time to discuss these results.  Objective:   Blood pressure 106/67, pulse 67, temperature 98.3 F (36.8 C), temperature source Oral, height 5\' 2"  (1.575 m), weight 288 lb (130.6 kg), SpO2 92 %. Body mass index is 52.68 kg/m.  General: Cooperative, alert, well developed, in no acute distress. HEENT: Conjunctivae and lids unremarkable. Cardiovascular: Regular rhythm.  Lungs: Normal work of breathing. Neurologic: No focal deficits.   Lab Results  Component Value Date   CREATININE 0.83 07/05/2019   BUN 22 07/05/2019   NA 139 07/05/2019   K 4.1 07/05/2019   CL 98 07/05/2019   CO2 29 07/05/2019   Lab Results  Component Value Date   ALT 28 07/05/2019   AST 36 07/05/2019  ALKPHOS 96 07/05/2019   BILITOT 0.7 07/05/2019   Lab Results  Component Value Date   HGBA1C 5.2 05/10/2019   HGBA1C 5.4 01/13/2019   Lab Results  Component Value Date   INSULIN 17.0 05/10/2019   INSULIN 24.5 01/13/2019   Lab Results  Component Value Date   TSH 0.441 (L) 07/05/2019   Lab Results  Component Value Date   CHOL  121 05/10/2019   HDL 42 05/10/2019   LDLCALC 66 05/10/2019   TRIG 62 05/10/2019   Lab Results  Component Value Date   WBC 9.3 05/10/2019   HGB 13.3 05/10/2019   HCT 42.9 05/10/2019   MCV 84 05/10/2019   PLT 242 05/10/2019   Lab Results  Component Value Date   IRON 63 01/13/2019   TIBC 383 01/13/2019   FERRITIN 36 01/13/2019   Obesity Behavioral Intervention:   Approximately 15 minutes were spent on the discussion below.  ASK: We discussed the diagnosis of obesity with Morgan Odonnell today and Morgan Odonnell agreed to give [name] permission to discuss obesity behavioral modification therapy today.  ASSESS: Morgan Odonnell has the diagnosis of obesity and her BMI today is 52.8. Morgan Odonnell is in the action stage of change.   ADVISE: Morgan Odonnell was educated on the multiple health risks of obesity as well as the benefit of weight loss to improve her health. She was advised of the need for long term treatment and the importance of lifestyle modifications to improve her current health and to decrease her risk of future health problems.  AGREE: Multiple dietary modification options and treatment options were discussed and Morgan Odonnell agreed to follow the recommendations documented in the above note.  ARRANGE: Morgan Odonnell was educated on the importance of frequent visits to treat obesity as outlined per CMS and USPSTF guidelines and agreed to schedule her next follow up appointment today.  Attestation Statements:   Reviewed by clinician on day of visit: allergies, medications, problem list, medical history, surgical history, family history, social history, and previous encounter notes.  I, Water quality scientist, CMA, am acting as Location manager for [employer], DO.  I have reviewed the above documentation for accuracy and completeness, and I agree with the above. Briscoe Deutscher, DO

## 2019-07-20 ENCOUNTER — Other Ambulatory Visit (INDEPENDENT_AMBULATORY_CARE_PROVIDER_SITE_OTHER): Payer: Self-pay | Admitting: Family Medicine

## 2019-08-03 ENCOUNTER — Ambulatory Visit (INDEPENDENT_AMBULATORY_CARE_PROVIDER_SITE_OTHER): Payer: Medicare Other | Admitting: Family Medicine

## 2019-08-11 ENCOUNTER — Other Ambulatory Visit (INDEPENDENT_AMBULATORY_CARE_PROVIDER_SITE_OTHER): Payer: Self-pay | Admitting: Family Medicine

## 2019-08-11 DIAGNOSIS — E559 Vitamin D deficiency, unspecified: Secondary | ICD-10-CM

## 2019-08-23 ENCOUNTER — Other Ambulatory Visit (INDEPENDENT_AMBULATORY_CARE_PROVIDER_SITE_OTHER): Payer: Self-pay | Admitting: Family Medicine

## 2019-08-30 ENCOUNTER — Other Ambulatory Visit (INDEPENDENT_AMBULATORY_CARE_PROVIDER_SITE_OTHER): Payer: Self-pay | Admitting: Family Medicine

## 2019-09-02 ENCOUNTER — Other Ambulatory Visit (INDEPENDENT_AMBULATORY_CARE_PROVIDER_SITE_OTHER): Payer: Self-pay | Admitting: Family Medicine

## 2019-09-28 ENCOUNTER — Emergency Department (HOSPITAL_COMMUNITY): Payer: Medicare Other

## 2019-09-28 ENCOUNTER — Encounter (HOSPITAL_COMMUNITY): Payer: Self-pay | Admitting: Emergency Medicine

## 2019-09-28 ENCOUNTER — Emergency Department (HOSPITAL_COMMUNITY)
Admission: EM | Admit: 2019-09-28 | Discharge: 2019-09-28 | Disposition: A | Discharge status: Home / Self Care | Payer: Medicare Other | Attending: Emergency Medicine | Admitting: Emergency Medicine

## 2019-09-28 DIAGNOSIS — R0602 Shortness of breath: Secondary | ICD-10-CM | POA: Diagnosis not present

## 2019-09-28 DIAGNOSIS — R531 Weakness: Secondary | ICD-10-CM | POA: Diagnosis present

## 2019-09-28 DIAGNOSIS — Z5321 Procedure and treatment not carried out due to patient leaving prior to being seen by health care provider: Secondary | ICD-10-CM | POA: Insufficient documentation

## 2019-09-28 DIAGNOSIS — R05 Cough: Secondary | ICD-10-CM | POA: Insufficient documentation

## 2019-09-28 DIAGNOSIS — R5383 Other fatigue: Secondary | ICD-10-CM | POA: Diagnosis not present

## 2019-09-28 LAB — CBC
HCT: 36.4 % (ref 36.0–46.0)
Hemoglobin: 13.2 g/dL (ref 12.0–15.0)
MCH: 33.9 pg (ref 26.0–34.0)
MCHC: 36.3 g/dL — ABNORMAL HIGH (ref 30.0–36.0)
MCV: 93.6 fL (ref 80.0–100.0)
Platelets: 220 10*3/uL (ref 150–400)
RBC: 3.89 MIL/uL (ref 3.87–5.11)
RDW: 19.4 % — ABNORMAL HIGH (ref 11.5–15.5)
WBC: 7.5 10*3/uL (ref 4.0–10.5)
nRBC: 0 % (ref 0.0–0.2)

## 2019-09-28 LAB — BRAIN NATRIURETIC PEPTIDE: B Natriuretic Peptide: 76.4 pg/mL (ref 0.0–100.0)

## 2019-09-28 LAB — BASIC METABOLIC PANEL
Anion gap: 11 (ref 5–15)
BUN: 14 mg/dL (ref 8–23)
CO2: 29 mmol/L (ref 22–32)
Calcium: 8.8 mg/dL — ABNORMAL LOW (ref 8.9–10.3)
Chloride: 100 mmol/L (ref 98–111)
Creatinine, Ser: 1 mg/dL (ref 0.44–1.00)
GFR calc Af Amer: >60 mL/min (ref >60)
GFR calc non Af Amer: 58 mL/min — ABNORMAL LOW (ref >60)
Glucose, Bld: 98 mg/dL (ref 70–99)
Potassium: 4.3 mmol/L (ref 3.5–5.1)
Sodium: 140 mmol/L (ref 135–145)

## 2019-09-28 LAB — TROPONIN I (HIGH SENSITIVITY): Troponin I (High Sensitivity): 4 ng/L (ref <18)

## 2019-09-28 MED ORDER — SODIUM CHLORIDE 0.9% FLUSH: 3.0000 mL | Freq: Once | INTRAVENOUS | Status: DC

## 2019-09-28 NOTE — ED Triage Notes (Signed)
Patient reports SOB, cough, fatigue/weakness X several days. Patient wears 3L [place] chronically at home, has not required any more O2. Denies sick contacts.

## 2019-10-12 ENCOUNTER — Encounter (HOSPITAL_COMMUNITY): Payer: Medicare Other | Admitting: Cardiology

## 2019-10-23 ENCOUNTER — Encounter (INDEPENDENT_AMBULATORY_CARE_PROVIDER_SITE_OTHER): Payer: Self-pay | Admitting: Family Medicine

## 2019-10-23 DIAGNOSIS — E038 Other specified hypothyroidism: Secondary | ICD-10-CM

## 2019-10-26 MED ORDER — LEVOTHYROXINE SODIUM 25 MCG PO TABS: ORAL_TABLET | ORAL | 0 refills | Status: DC

## 2019-11-16 ENCOUNTER — Telehealth (INDEPENDENT_AMBULATORY_CARE_PROVIDER_SITE_OTHER): Payer: Medicare (Managed Care) | Admitting: Family Medicine

## 2019-11-24 ENCOUNTER — Ambulatory Visit (INDEPENDENT_AMBULATORY_CARE_PROVIDER_SITE_OTHER): Payer: Medicare (Managed Care) | Admitting: Family Medicine

## 2019-11-25 ENCOUNTER — Other Ambulatory Visit (INDEPENDENT_AMBULATORY_CARE_PROVIDER_SITE_OTHER): Payer: Self-pay | Admitting: Family Medicine

## 2019-11-25 DIAGNOSIS — E038 Other specified hypothyroidism: Secondary | ICD-10-CM

## 2019-12-10 ENCOUNTER — Other Ambulatory Visit (INDEPENDENT_AMBULATORY_CARE_PROVIDER_SITE_OTHER): Payer: Self-pay | Admitting: Family Medicine

## 2019-12-10 DIAGNOSIS — E038 Other specified hypothyroidism: Secondary | ICD-10-CM

## 2020-01-25 ENCOUNTER — Ambulatory Visit (INDEPENDENT_AMBULATORY_CARE_PROVIDER_SITE_OTHER): Payer: Self-pay | Admitting: Pulmonary Disease

## 2020-01-25 ENCOUNTER — Other Ambulatory Visit: Payer: Self-pay

## 2020-01-25 ENCOUNTER — Encounter: Payer: Self-pay | Admitting: Pulmonary Disease

## 2020-01-25 VITALS — BP 116/72 | HR 72 | Temp 98.4°F | Ht 62.0 in | Wt 299.0 lb

## 2020-01-25 DIAGNOSIS — R0609 Other forms of dyspnea: Secondary | ICD-10-CM

## 2020-01-25 DIAGNOSIS — J9611 Chronic respiratory failure with hypoxia: Secondary | ICD-10-CM

## 2020-01-25 DIAGNOSIS — J9612 Chronic respiratory failure with hypercapnia: Secondary | ICD-10-CM

## 2020-01-25 DIAGNOSIS — R0689 Other abnormalities of breathing: Secondary | ICD-10-CM

## 2020-01-25 DIAGNOSIS — R0600 Dyspnea, unspecified: Secondary | ICD-10-CM

## 2020-01-25 DIAGNOSIS — Z23 Encounter for immunization: Secondary | ICD-10-CM

## 2020-01-25 NOTE — Patient Instructions (Signed)
Nice to meet you!  Congratulations on the weight loss!! Keep going - the progress you have made is awesome!  No changes today.  Call [employer] and tell them you need a new portable concentrator because the battery dies.  Get the covid booster and flu shot in the next week - ok to get both at the same time!  Come back for follow up with Dr. Silas Flood in 6 months or sooner if needed

## 2020-01-26 NOTE — Progress Notes (Signed)
Patient ID: Morgan Odonnell, female    DOB: 1950/03/26, 69 y.o.   MRN: [id number]  Chief Complaint  Patient presents with  . Follow-up    SOB    Referring provider: Leanna Battles, MD  HPI:   69 year old woman with OHS/OSA (elevated PCO2 and chronically elevated bicarb) previously followed in this clinic whom I am seeing as a new patient in follow-up for chronic hypoxemic restaurant failure.  Reviewed prior pulmonary notes as well as notes from hospital discharge in 2019 for mixed respiratory failure and volume overload.  Overall, she is doing quite well.  She feels her breathing is at baseline if not somewhat better than usual.  She attributes this largely to 35 pound weight loss in the setting of seeing provider at weight loss clinic.  She continues to plan to lose weight.  She is frustrated by inability to to receive elective procedures as colonoscopy which she lost her sister-in-law to as well as gastric bypass due to her weight and breathing issues.  No cough.  She not very active.  She has chronic lower extremity swelling she does not feels any worse.  No fever or chills.  No cough.  PMH:  Smoker/ Smoking History:  Maintenance:   Pt of:   01/26/2020  - Visit     Questionaires / Pulmonary Flowsheets:   ACT:  No flowsheet data found.  MMRC: No flowsheet data found.  Epworth:  No flowsheet data found.  Tests:   FENO:  No results found for: NITRICOXIDE  PFT: PFT Results Latest Ref Rng & Units 07/01/2017 12/13/2015  FVC-Pre L 0.98 1.37  FVC-Predicted Pre % 33 46  FVC-Post L - 1.28  FVC-Predicted Post % - 43  Pre FEV1/FVC % % 81 84  Post FEV1/FCV % % - 83  FEV1-Pre L 0.79 1.14  FEV1-Predicted Pre % 35 50  FEV1-Post L - 1.06  DLCO uncorrected ml/min/mmHg 11.25 12.40  DLCO UNC% % 52 57  DLCO corrected ml/min/mmHg 11.54 11.95  DLCO COR %Predicted % 53 55  DLVA Predicted % 111 98  TLC L - 3.10  TLC % Predicted % - 65  RV % Predicted % - 73    WALK:   SIX MIN WALK 12/13/2015  Tech Comments: o2 sat on RA at rest after 10 min no o2 was 98%- walked 1 min and sats dropped to 88% so started 2lpm o2-sats increased to 96% ra, stopped to rest x 2 and did not finish remaining 2 min of test due to knee pain. sats at end 91% 2lpm//lmr    Imaging: Personally reviewed, chest x-ray 2019 with cardiomegaly and mild interstitial prominence on my interpretation  Lab Results: Personally reviewed CBC    Component Value Date/Time   WBC 7.5 09/28/2019 1734   RBC 3.89 09/28/2019 1734   HGB 13.2 09/28/2019 1734   HGB 13.3 05/10/2019 1426   HCT 36.4 09/28/2019 1734   HCT 42.9 05/10/2019 1426   PLT 220 09/28/2019 1734   PLT 242 05/10/2019 1426   MCV 93.6 09/28/2019 1734   MCV 84 05/10/2019 1426   MCH 33.9 09/28/2019 1734   MCHC 36.3 (H) 09/28/2019 1734   RDW 19.4 (H) 09/28/2019 1734   RDW 14.2 05/10/2019 1426   LYMPHSABS 2.6 05/10/2019 1426   MONOABS 1.0 06/26/2017 2131   EOSABS 0.3 05/10/2019 1426   BASOSABS 0.1 05/10/2019 1426    BMET    Component Value Date/Time   NA 140 09/28/2019 1734   NA 139  07/05/2019 1557   K 4.3 09/28/2019 1734   CL 100 09/28/2019 1734   CO2 29 09/28/2019 1734   GLUCOSE 98 09/28/2019 1734   BUN 14 09/28/2019 1734   BUN 22 07/05/2019 1557   CREATININE 1.00 09/28/2019 1734   CALCIUM 8.8 (L) 09/28/2019 1734   GFRNONAA 58 (L) 09/28/2019 1734   GFRAA >60 09/28/2019 1734    BNP    Component Value Date/Time   BNP 76.4 09/28/2019 1734    ProBNP    Component Value Date/Time   PROBNP 112.0 (H) 08/26/2013 1616    Specialty Problems      Pulmonary Problems   Chronic cough   COPD (chronic obstructive pulmonary disease)Gold C    PFTs 06/15/2012: FeV1 57%  FeV1/FVC 70%  Fef 27 75 25% TLC 66%  DLCO 52%         Snoring    05/2012 Sleep study:  Normal, no significant sleep apnea.  AI: 1.6   Significant desaturation through the night seen      Chronic respiratory failure (HCC)   Obesity hypoventilation  syndrome (HCC)   Restrictive lung disease      Allergies  Allergen Reactions  . Gabapentin     "slept for 2 days"  . Novocain [Procaine] Other (See Comments)    intolerance    Immunization History  Administered Date(s) Administered  . Influenza Split 12/10/2011  . Influenza Whole 01/09/2017  . Influenza,inj,Quad PF,6+ Mos 12/04/2012, 01/17/2014, 12/13/2015, 01/25/2020  . Moderna SARS-COVID-2 Vaccination 06/16/2019, 07/14/2019  . Pneumococcal Conjugate-13 01/17/2014  . Pneumococcal Polysaccharide-23 01/26/2013    Past Medical History:  Diagnosis Date  . Allergies   . Anemia   . Back pain   . Bilateral swelling of feet   . CHF (congestive heart failure) ([place])   . Chronic cough   . COPD (chronic obstructive pulmonary disease) ([place])   . Depression   . GERD (gastroesophageal reflux disease)   . Heart murmur   . Hypertension   . Hypocapnia   . Hypothyroid   . Knee pain   . Leg edema    chronic, bilateral  . Lightheaded   . Low back pain   . Morbid obesity ([place])   . Neck pain   . Obesity hypoventilation syndrome ([place])   . Oral lesion   . Shortness of breath   . Shortness of breath   . Shoulder pain   . Snoring     Tobacco History: Social History   Tobacco Use  Smoking Status Former Smoker  . Packs/day: 2.00  . Years: 45.00  . Pack years: 90.00  . Types: Cigarettes  . Quit date: 04/12/2015  . Years since quitting: 4.7  Smokeless Tobacco Never Used  Tobacco Comment   still chews nicotine gum   Counseling given: Not Answered Comment: still chews nicotine gum   Continue to not smoke  Outpatient Encounter Medications as of 01/25/2020  Medication Sig  . albuterol (PROVENTIL HFA;VENTOLIN HFA) 108 (90 BASE) MCG/ACT inhaler Inhale 2 puffs into the lungs every 6 (six) hours as needed for wheezing.   Marland Kitchen aspirin EC 81 MG EC tablet Take 1 tablet (81 mg total) by mouth daily.  . baclofen (LIORESAL) 10 MG tablet Take 1 tablet by mouth 4 (four) times daily as  needed for muscle spasms.   Marland Kitchen buPROPion (WELLBUTRIN SR) 100 MG 12 hr tablet Take 100 mg by mouth every morning.  Marland Kitchen buPROPion (WELLBUTRIN) 100 MG tablet Take 100 mg by mouth daily.   Marland Kitchen  celecoxib (CELEBREX) 200 MG capsule Take 200 mg by mouth daily.  . citalopram (CELEXA) 20 MG tablet Take 1 tablet (20 mg total) by mouth daily.  . diclofenac sodium (VOLTAREN) 1 % GEL Apply 1 application topically 4 (four) times daily. Applied to knees, shoulders, wrists, and ankles  . diphenhydrAMINE (BENADRYL) 50 MG capsule Take 50 mg by mouth every 6 (six) hours as needed.  . furosemide (LASIX) 40 MG tablet 2 tablets (80mg ) by mouth two times a day  . levothyroxine (SYNTHROID) 200 MCG tablet Take 1 tablet (200 mcg total) by mouth daily before breakfast.  . levothyroxine (SYNTHROID) 25 MCG tablet Take 1 tab WITH LEVOTHYROXINE 200 MCG on Monday, Wednesday, and Friday.  . metoprolol tartrate (LOPRESSOR) 25 MG tablet Take 0.5 tablets (12.5 mg total) by mouth 2 (two) times daily. (Patient taking differently: Take 12.5 mg by mouth daily. )  . nicotine polacrilex (NICORETTE) 4 MG gum Take 4 mg by mouth as needed for smoking cessation.  Marland Kitchen oxyCODONE-acetaminophen (PERCOCET/ROXICET) 5-325 MG tablet Take by mouth every 4 (four) hours as needed for severe pain.  . potassium chloride SA (K-DUR,KLOR-CON) 20 MEQ tablet 2 tablets (40 mEq) by mouth in the AM and 1 tablet (20 mEq) by mouth in the PM  . pregabalin (LYRICA) 100 MG capsule Take 1 capsule (100 mg total) by mouth 2 (two) times daily. (Patient taking differently: Take 50 mg by mouth 2 (two) times daily. )  . pregabalin (LYRICA) 50 MG capsule 1 capsule Orally Twice a day 30 days  . Spacer/Aero-Holding Chambers (AEROCHAMBER Z-STAT PLUS CHAMBR) MISC Use as directed  . SPIRIVA HANDIHALER 18 MCG inhalation capsule PLACE 1 CAPSULE (18 MCG TOTAL) INTO INHALER AND INHALE DAILY.  . SYMBICORT 160-4.5 MCG/ACT inhaler INHALE TWO puffs into lung TWICE DAILY  . Vitamin D,  Ergocalciferol, (DRISDOL) 1.25 MG (50000 UNIT) CAPS capsule Take 1 capsule (50,000 Units total) by mouth every 7 (seven) days.   No facility-administered encounter medications on file as of 01/25/2020.     Review of Systems  Review of Systems  No chest pain.  No orthopnea or PND.  Comprehensive review of systems otherwise negative.  Physical Exam  BP 116/72 (BP Location: Left Arm, Cuff Size: Normal)   Pulse 72   Temp 98.4 F (36.9 C) (Oral)   Ht 5\' 2"  (1.575 m)   Wt 299 lb (135.6 kg)   SpO2 94%   BMI 54.69 kg/m   Wt Readings from Last 5 Encounters:  01/25/20 299 lb (135.6 kg)  09/28/19 287 lb 14.7 oz (130.6 kg)  07/05/19 288 lb (130.6 kg)  06/14/19 294 lb (133.4 kg)  05/24/19 290 lb (131.5 kg)    BMI Readings from Last 5 Encounters:  01/25/20 54.69 kg/m  09/28/19 52.66 kg/m  07/05/19 52.68 kg/m  06/14/19 53.77 kg/m  05/24/19 53.04 kg/m     Physical Exam General: Obese, sitting in wheelchair in no acute distress Eyes: EOMI, no icterus Neck: Supple, no JVD appreciated but difficult habitus Respiratory: Distant, clear to auscultation bilaterally Cardiovascular: Mild tachycardia, regular rhythm Abdomen: Obese, not hospital MSK: No synovitis, joint effusion Neuro: No weakness on exam, sensation intact Psych: Normal mood, full affect   Assessment & Plan:   Chronic hypoxemic respiratory failure: Largely due to obesity hypoventilation syndrome, some contribution of possible COPD due to her history of cigarette smoking.  She is doing well on pulsed oxygen continuously day and night.  She did not tolerate noninvasive positive pressure ventilation for sleep disordered breathing  and does not wish to pursue this in the future.  She is congratulated on 35 pound weight gain that she reports that is corroborated on review of serial weights here in clinic.  Advised her that ongoing weight loss is the best thing she can do for both her overall health and for her respiratory  issues.  Dyspnea on exertion: Multifactorial related to obesity hypoventilation, sleep disordered breathing, prior cigarette smoking.  Prior spirometry personally reviewed and interpreted as suggestive of severe restriction with severely with DLCO but does correct significantly for alveolar volume suggestive of significant distribution of obesity to her restrictive physiology.  Healthcare maintenance: Advised booster of Covid vaccine as well as flu shot this season.  She agrees and wishes to pursue this.  Flu shot administered in clinic today.  Return in about 6 months (around 07/24/2020).   Lanier Clam, MD 01/26/2020

## 2020-01-31 ENCOUNTER — Other Ambulatory Visit (INDEPENDENT_AMBULATORY_CARE_PROVIDER_SITE_OTHER): Payer: Self-pay | Admitting: Family Medicine

## 2020-01-31 ENCOUNTER — Telehealth: Payer: Self-pay | Admitting: Pulmonary Disease

## 2020-01-31 DIAGNOSIS — E038 Other specified hypothyroidism: Secondary | ICD-10-CM

## 2020-02-01 ENCOUNTER — Telehealth: Payer: Self-pay | Admitting: Pulmonary Disease

## 2020-02-01 ENCOUNTER — Other Ambulatory Visit (INDEPENDENT_AMBULATORY_CARE_PROVIDER_SITE_OTHER): Payer: Self-pay | Admitting: Family Medicine

## 2020-02-01 DIAGNOSIS — E038 Other specified hypothyroidism: Secondary | ICD-10-CM

## 2020-02-01 MED ORDER — INCRUSE ELLIPTA 62.5 MCG/INH IN AEPB: 1.0000 | INHALATION_SPRAY | Freq: Every day | RESPIRATORY_TRACT | 1 refills | Status: DC

## 2020-02-01 NOTE — Telephone Encounter (Signed)
Incruse ellipta inhaler sent to pharmacy.  ATC patient, left detailed message regarding the inhaler being sent to her pharmacy.  Advised to call the office in the morning with any questions.

## 2020-02-01 NOTE — Telephone Encounter (Signed)
Dr Wallace pt °

## 2020-02-01 NOTE — Telephone Encounter (Signed)
PA started, called and made patient aware.  She states she ran out of her spiriva over the weekend.  I advised her that I would let Dr. Silas Flood know and see what he advises while we wait to hear the outcome of the PA.  She does have her Symbicort.  Dr. Silas Flood, Do you want the patient to use an alternate inhaler until we get a decision on the PA?  Patient ran out of her Sprivia Handihaler over the weekend.  She does have her Symbicort.  Please advise.  Thank you.

## 2020-02-01 NOTE — Telephone Encounter (Signed)
Incruse Ellipta 1 puff daily.

## 2020-02-01 NOTE — Telephone Encounter (Signed)
PA request was received from (pharmacy): [facility] Phone:[phone]  Medication name and strength:  Spiriva Handihaler 18 mcg inhalation capsule Ordering Provider: [place]  Was PA started with CMM?: yes If yes, please enter KEY: [id number] Medication tried and failed: Dulera 200-5 mcg,  Covered Alternatives:  unknown  PA sent to plan, time frame for approval / denial: 15 minutes to 24 hours Routing to triage for follow-up

## 2020-02-02 NOTE — Telephone Encounter (Signed)
Spoke with pt and verified that she received message regarding Incruse. Pt verbalized understanding. Nothing further needed at this time.

## 2020-02-08 ENCOUNTER — Telehealth: Payer: Self-pay | Admitting: Pulmonary Disease

## 2020-02-08 NOTE — Telephone Encounter (Signed)
Message from Plan Approved. This drug has been approved under the Member's Medicare Part D benefit. Approved quantity: 30 <> per 30 day(s). You may fill up to a 90 day supply except for those on Specialty Tier 5, which can be filled up to a 30 day supply. Please call the pharmacy to process the prescription claim.  Call placed to [facility] to make them aware.  They ran the claim and it went through.    Called and spoke with patient, advised patient the PA for her Spiriva Handihaler had been approved and the pharmacy ran the claim and it went through.  Nothing further needed.

## 2020-02-08 NOTE — Telephone Encounter (Signed)
Ok to stop incruse. If not better after 2 days would prescribe prednisone course. If worsens go to ED.

## 2020-02-08 NOTE — Telephone Encounter (Signed)
Called and spoke to pt. Informed her of the recs per Dr. Silas Flood. Pt verbalized understanding. Pt is awaiting response regarding the PA for her Spiriva. Will keep message open.

## 2020-02-08 NOTE — Telephone Encounter (Signed)
Called and spoke to pt. Pt states since she began the incruse she can tell her breathing has worsened. Pt c/o intermittent chest tightness, non productive cough, chest congestion x 1 week. Pt denies f/c/s and covid contacts. Pt states she can tell that the spiriva worked far better. Pt last seen on 01/25/2020.    Dr. Silas Flood, please advise on recs for s/s.   Heather, please advise on pt's Spiriva PA. Thanks.

## 2020-02-09 NOTE — Telephone Encounter (Signed)
Spiriva PA was approved.  I called the pharmacy and the patient previously in another phone note.

## 2021-01-04 ENCOUNTER — Ambulatory Visit: Payer: Medicare (Managed Care) | Admitting: Cardiology

## 2021-01-23 ENCOUNTER — Ambulatory Visit: Payer: Medicare (Managed Care) | Admitting: Cardiology

## 2021-02-21 ENCOUNTER — Ambulatory Visit: Payer: Medicare (Managed Care) | Admitting: Cardiology

## 2021-02-21 DIAGNOSIS — R0609 Other forms of dyspnea: Secondary | ICD-10-CM | POA: Insufficient documentation

## 2021-02-21 DIAGNOSIS — R072 Precordial pain: Secondary | ICD-10-CM | POA: Insufficient documentation

## 2021-02-21 NOTE — Progress Notes (Deleted)
° ° °Patient referred by Paterson, Daniel, MD for *** ° °Subjective:  ° °Morgan Odonnell, female    DOB: 11/03/1950, 70 y.o.   MRN: [id number] ° °*** °No chief complaint on file. ° ° °*** °HPI ° °70 y.o. *** female with *** ° °*** °Past Medical History:  °Diagnosis Date  ° Allergies   ° Anemia   ° Back pain   ° Bilateral swelling of feet   ° CHF (congestive heart failure) (HCC)   ° Chronic cough   ° COPD (chronic obstructive pulmonary disease) (HCC)   ° Depression   ° GERD (gastroesophageal reflux disease)   ° Heart murmur   ° Hypertension   ° Hypocapnia   ° Hypothyroid   ° Knee pain   ° Leg edema   ° chronic, bilateral  ° Lightheaded   ° Low back pain   ° Morbid obesity (HCC)   ° Neck pain   ° Obesity hypoventilation syndrome (HCC)   ° Oral lesion   ° Shortness of breath   ° Shortness of breath   ° Shoulder pain   ° Snoring   ° ° °*** °Past Surgical History:  °Procedure Laterality Date  ° APPENDECTOMY    ° CHOLECYSTECTOMY    ° COLONOSCOPY N/A 06/25/2013  ° Procedure: COLONOSCOPY;  Surgeon: Patrick D Hung, MD;  Location: [facility];  Service: Endoscopy;  Laterality: N/A;  ° ECTOPIC PREGNANCY SURGERY    ° Left Ovary Removed    ° MENISCUS REPAIR    ° TONSILLECTOMY    ° TUBAL LIGATION    ° ° °*** °Social History  ° °Tobacco Use  °Smoking Status Former  ° Packs/day: 2.00  ° Years: 45.00  ° Pack years: 90.00  ° Types: Cigarettes  ° Quit date: 04/12/2015  ° Years since quitting: 5.8  °Smokeless Tobacco Never  °Tobacco Comments  ° still chews nicotine gum  ° ° °Social History  ° °Substance and Sexual Activity  °Alcohol Use No  ° Alcohol/week: 0.0 standard drinks  ° ° °*** °Family History  °Problem Relation Age of Onset  ° Ovarian cancer Mother   ° Lung disease Mother   ° Heart failure Mother   ° Hypertension Mother   ° Heart disease Mother   ° Seizures Mother   ° Cancer Mother   ° Depression Mother   ° Sleep apnea Mother   ° Alcoholism Mother   ° Eating disorder Mother   ° Obesity Mother   ° Hypertension Father   ° Eating  disorder Father   ° Obesity Father   ° Anxiety disorder Father   ° Lymphoma Brother   ° Ovarian cancer Maternal Aunt   ° Asthma Daughter   ° ° °*** °Current Outpatient Medications on File Prior to Visit  °Medication Sig Dispense Refill  ° albuterol (PROVENTIL HFA;VENTOLIN HFA) 108 (90 BASE) MCG/ACT inhaler Inhale 2 puffs into the lungs every 6 (six) hours as needed for wheezing.     ° aspirin EC 81 MG EC tablet Take 1 tablet (81 mg total) by mouth daily. 30 tablet 0  ° baclofen (LIORESAL) 10 MG tablet Take 1 tablet by mouth 4 (four) times daily as needed for muscle spasms.     ° buPROPion (WELLBUTRIN SR) 100 MG 12 hr tablet Take 100 mg by mouth every morning.    ° buPROPion (WELLBUTRIN) 100 MG tablet Take 100 mg by mouth daily.     ° celecoxib (CELEBREX) 200 MG capsule Take 200 mg by mouth   daily.    ° citalopram (CELEXA) 20 MG tablet Take 1 tablet (20 mg total) by mouth daily. 30 tablet 0  ° diclofenac sodium (VOLTAREN) 1 % GEL Apply 1 application topically 4 (four) times daily. Applied to knees, shoulders, wrists, and ankles    ° diphenhydrAMINE (BENADRYL) 50 MG capsule Take 50 mg by mouth every 6 (six) hours as needed.    ° furosemide (LASIX) 40 MG tablet 2 tablets (80mg) by mouth two times a day 120 tablet 1  ° levothyroxine (SYNTHROID) 200 MCG tablet Take 1 tablet (200 mcg total) by mouth daily before breakfast. 90 tablet 0  ° levothyroxine (SYNTHROID) 25 MCG tablet Take 1 tab WITH LEVOTHYROXINE 200 MCG on Monday, Wednesday, and Friday. 30 tablet 0  ° metoprolol tartrate (LOPRESSOR) 25 MG tablet Take 0.5 tablets (12.5 mg total) by mouth 2 (two) times daily. (Patient taking differently: Take 12.5 mg by mouth daily. ) 30 tablet 0  ° nicotine polacrilex (NICORETTE) 4 MG gum Take 4 mg by mouth as needed for smoking cessation.    ° oxyCODONE-acetaminophen (PERCOCET/ROXICET) 5-325 MG tablet Take by mouth every 4 (four) hours as needed for severe pain.    ° potassium chloride SA (K-DUR,KLOR-CON) 20 MEQ tablet 2 tablets  (40 mEq) by mouth in the AM and 1 tablet (20 mEq) by mouth in the PM 90 tablet 0  ° pregabalin (LYRICA) 100 MG capsule Take 1 capsule (100 mg total) by mouth 2 (two) times daily. (Patient taking differently: Take 50 mg by mouth 2 (two) times daily. ) 60 capsule 0  ° pregabalin (LYRICA) 50 MG capsule 1 capsule Orally Twice a day 30 days    ° Spacer/Aero-Holding Chambers (AEROCHAMBER Z-STAT PLUS CHAMBR) MISC Use as directed 1 each 0  ° SPIRIVA HANDIHALER 18 MCG inhalation capsule PLACE 1 CAPSULE (18 MCG TOTAL) INTO INHALER AND INHALE DAILY. 30 capsule 6  ° SYMBICORT 160-4.5 MCG/ACT inhaler INHALE TWO puffs into lung TWICE DAILY 10.2 g 11  ° umeclidinium bromide (INCRUSE ELLIPTA) 62.5 MCG/INH AEPB Inhale 1 puff into the lungs daily. 30 each 1  ° Vitamin D, Ergocalciferol, (DRISDOL) 1.25 MG (50000 UNIT) CAPS capsule Take 1 capsule (50,000 Units total) by mouth every 7 (seven) days. 12 capsule 0  ° °No current facility-administered medications on file prior to visit.  ° ° °Cardiovascular and other pertinent studies: ° °*** °EKG ***/***/202***: °*** ° ° °*** °Recent labs: °***/***/202***: °Glucose ***, BUN/Cr ***/***. EGFR ***. Na/K ***/***. ***Rest of the CMP normal °H/H ***/***. MCV ***. Platelets *** °***HbA1C ***% °Chol ***, TG ***, HDL ***, LDL *** °***TSH ***normal ° ° °*** °ROS ° °   ° °*** °There were no vitals filed for this visit. ° ° °There is no height or weight on file to calculate BMI. °There were no vitals filed for this visit. ° °*** °Objective:  ° Physical Exam ° ° ° °*** °   ° °Assessment & Recommendations:  ° °*** ° °*** ° °Thank you for referring the patient to us. Please feel free to contact with any questions. ° ° °Kyzen Horn J Holt Woolbright, MD °Pager: [phone] °Office: [phone] ° °

## 2021-03-01 ENCOUNTER — Ambulatory Visit: Payer: Medicare (Managed Care) | Admitting: Cardiology

## 2021-03-01 NOTE — Progress Notes (Deleted)
Patient referred by Leanna Battles, MD for ***  Subjective:   Morgan Odonnell, female    DOB: 04-20-1950, 70 y.o.   MRN: [id number]  *** No chief complaint on file.   *** HPI  70 y.o. *** female with ***  *** Past Medical History:  Diagnosis Date   Allergies    Anemia    Back pain    Bilateral swelling of feet    CHF (congestive heart failure) (HCC)    Chronic cough    COPD (chronic obstructive pulmonary disease) (HCC)    Depression    GERD (gastroesophageal reflux disease)    Heart murmur    Hypertension    Hypocapnia    Hypothyroid    Knee pain    Leg edema    chronic, bilateral   Lightheaded    Low back pain    Morbid obesity (HCC)    Neck pain    Obesity hypoventilation syndrome (HCC)    Oral lesion    Shortness of breath    Shortness of breath    Shoulder pain    Snoring     *** Past Surgical History:  Procedure Laterality Date   APPENDECTOMY     CHOLECYSTECTOMY     COLONOSCOPY N/A 06/25/2013   Procedure: COLONOSCOPY;  Surgeon: [name], MD;  Location: [facility];  Service: Endoscopy;  Laterality: N/A;   ECTOPIC PREGNANCY SURGERY     Left Ovary Removed     MENISCUS REPAIR     TONSILLECTOMY     TUBAL LIGATION      *** Social History   Tobacco Use  Smoking Status Former   Packs/day: 2.00   Years: 45.00   Pack years: 90.00   Types: Cigarettes   Quit date: 04/12/2015   Years since quitting: 5.8  Smokeless Tobacco Never  Tobacco Comments   still chews nicotine gum    Social History   Substance and Sexual Activity  Alcohol Use No   Alcohol/week: 0.0 standard drinks    *** Family History  Problem Relation Age of Onset   Ovarian cancer Mother    Lung disease Mother    Heart failure Mother    Hypertension Mother    Heart disease Mother    Seizures Mother    Cancer Mother    Depression Mother    Sleep apnea Mother    Alcoholism Mother    Eating disorder Mother    Obesity Mother    Hypertension Father    Eating  disorder Father    Obesity Father    Anxiety disorder Father    Lymphoma Brother    Ovarian cancer Maternal Aunt    Asthma Daughter     *** Current Outpatient Medications on File Prior to Visit  Medication Sig Dispense Refill   albuterol (PROVENTIL HFA;VENTOLIN HFA) 108 (90 BASE) MCG/ACT inhaler Inhale 2 puffs into the lungs every 6 (six) hours as needed for wheezing.      aspirin EC 81 MG EC tablet Take 1 tablet (81 mg total) by mouth daily. 30 tablet 0   baclofen (LIORESAL) 10 MG tablet Take 1 tablet by mouth 4 (four) times daily as needed for muscle spasms.      buPROPion (WELLBUTRIN SR) 100 MG 12 hr tablet Take 100 mg by mouth every morning.     buPROPion (WELLBUTRIN) 100 MG tablet Take 100 mg by mouth daily.      celecoxib (CELEBREX) 200 MG capsule Take 200 mg by mouth  daily.     citalopram (CELEXA) 20 MG tablet Take 1 tablet (20 mg total) by mouth daily. 30 tablet 0   diclofenac sodium (VOLTAREN) 1 % GEL Apply 1 application topically 4 (four) times daily. Applied to knees, shoulders, wrists, and ankles     diphenhydrAMINE (BENADRYL) 50 MG capsule Take 50 mg by mouth every 6 (six) hours as needed.     furosemide (LASIX) 40 MG tablet 2 tablets (33m) by mouth two times a day 120 tablet 1   levothyroxine (SYNTHROID) 200 MCG tablet Take 1 tablet (200 mcg total) by mouth daily before breakfast. 90 tablet 0   levothyroxine (SYNTHROID) 25 MCG tablet Take 1 tab WITH LEVOTHYROXINE 200 MCG on Monday, Wednesday, and Friday. 30 tablet 0   metoprolol tartrate (LOPRESSOR) 25 MG tablet Take 0.5 tablets (12.5 mg total) by mouth 2 (two) times daily. (Patient taking differently: Take 12.5 mg by mouth daily. ) 30 tablet 0   nicotine polacrilex (NICORETTE) 4 MG gum Take 4 mg by mouth as needed for smoking cessation.     oxyCODONE-acetaminophen (PERCOCET/ROXICET) 5-325 MG tablet Take by mouth every 4 (four) hours as needed for severe pain.     potassium chloride SA (K-DUR,KLOR-CON) 20 MEQ tablet 2 tablets  (40 mEq) by mouth in the AM and 1 tablet (20 mEq) by mouth in the PM 90 tablet 0   pregabalin (LYRICA) 100 MG capsule Take 1 capsule (100 mg total) by mouth 2 (two) times daily. (Patient taking differently: Take 50 mg by mouth 2 (two) times daily. ) 60 capsule 0   pregabalin (LYRICA) 50 MG capsule 1 capsule Orally Twice a day 30 days     Spacer/Aero-Holding Chambers (AEROCHAMBER Z-STAT PLUS CHAMBR) MISC Use as directed 1 each 0   SPIRIVA HANDIHALER 18 MCG inhalation capsule PLACE 1 CAPSULE (18 MCG TOTAL) INTO INHALER AND INHALE DAILY. 30 capsule 6   SYMBICORT 160-4.5 MCG/ACT inhaler INHALE TWO puffs into lung TWICE DAILY 10.2 g 11   umeclidinium bromide (INCRUSE ELLIPTA) 62.5 MCG/INH AEPB Inhale 1 puff into the lungs daily. 30 each 1   Vitamin D, Ergocalciferol, (DRISDOL) 1.25 MG (50000 UNIT) CAPS capsule Take 1 capsule (50,000 Units total) by mouth every 7 (seven) days. 12 capsule 0   No current facility-administered medications on file prior to visit.    Cardiovascular and other pertinent studies:  *** EKG ***/***/202***: ***   *** Recent labs: ***/***/202***: Glucose ***, BUN/Cr ***/***. EGFR ***. Na/K ***/***. ***Rest of the CMP normal H/H ***/***. MCV ***. Platelets *** ***HbA1C ***% Chol ***, TG ***, HDL ***, LDL *** ***TSH ***normal   *** ROS      *** There were no vitals filed for this visit.   There is no height or weight on file to calculate BMI. There were no vitals filed for this visit.  *** Objective:   Physical Exam    ***     Assessment & Recommendations:   ***  ***  Thank you for referring the patient to [facility] Please feel free to contact with any questions.   MNigel Mormon MD Pager: [phone]: [phone]

## 2021-03-29 ENCOUNTER — Ambulatory Visit (INDEPENDENT_AMBULATORY_CARE_PROVIDER_SITE_OTHER): Payer: Medicare (Managed Care) | Admitting: Family Medicine

## 2021-04-17 ENCOUNTER — Ambulatory Visit (INDEPENDENT_AMBULATORY_CARE_PROVIDER_SITE_OTHER): Payer: Medicare HMO | Admitting: Family Medicine

## 2021-04-19 ENCOUNTER — Ambulatory Visit (INDEPENDENT_AMBULATORY_CARE_PROVIDER_SITE_OTHER): Payer: Medicare (Managed Care) | Admitting: Family Medicine

## 2021-05-08 ENCOUNTER — Ambulatory Visit (INDEPENDENT_AMBULATORY_CARE_PROVIDER_SITE_OTHER): Payer: Medicare (Managed Care) | Admitting: Family Medicine

## 2021-08-23 ENCOUNTER — Encounter: Payer: Self-pay | Admitting: Pulmonary Disease

## 2021-08-23 ENCOUNTER — Ambulatory Visit: Payer: Medicare HMO | Admitting: Pulmonary Disease

## 2021-08-23 VITALS — BP 122/68 | HR 84 | Temp 98.2°F | Ht 62.0 in | Wt 325.0 lb

## 2021-08-23 DIAGNOSIS — J9611 Chronic respiratory failure with hypoxia: Secondary | ICD-10-CM | POA: Diagnosis not present

## 2021-08-23 DIAGNOSIS — Z23 Encounter for immunization: Secondary | ICD-10-CM

## 2021-08-23 NOTE — Progress Notes (Addendum)
Patient ID: Morgan Odonnell, female    DOB: [birth year], 71 y.o.   MRN: [id number]  Chief Complaint  Patient presents with   Follow-up    Pt is here for follow up from 2021. Pt states her breathing is doing ok. Pt states a lot of anxiety lately. Pt is on POC unit at 3L. Pt is on Symbicort and Spirivia daily. And Albuterol as needed.    Referring provider: Donnajean Lopes, MD  HPI:   71 y.o.woman with OHS/OSA (elevated PCO2 and chronically elevated bicarb) previously followed in this clinic whom I am seeing  in follow-up for chronic hypoxemic restaurant failure.  Last seen over 18 months ago.  Using 3L [place].  No change in breathing for last visit.  Baseline dyspnea persist.  Baseline hypoxemia unchanged.  At last visit, issues with Spiriva.  Tried LAMA via Ellipta device.  This caused side effects.  This was stopped.  Memory Dance was approved via prior authorization.  She continues to report adherence to Spiriva daily and Symbicort high-dose 2 puff twice daily.  Last refills through our office as well over a year ago.  Presumably getting to her PCP.  Unclear whether refills are.  She says she does not need any refills today.  She requests new oxygen portable concentrator given battery life decreasing.  Also request bed that legs can lift up and head of bed can be adjusted.  She also request refills of her chronic opiates as well as muscle relaxers.  This request was declined.  Advised her to follow-up with her PCP.  HPI at initial visit: Overall, she is doing quite well.  She feels her breathing is at baseline if not somewhat better than usual.  She attributes this largely to 35 pound weight loss in the setting of seeing provider at weight loss clinic.  She continues to plan to lose weight.  She is frustrated by inability to to receive elective procedures as colonoscopy which she lost her sister-in-law to as well as gastric bypass due to her weight and breathing issues.  No cough.  She not very  active.  She has chronic lower extremity swelling she does not feels any worse.  No fever or chills.  No cough.    Questionaires / Pulmonary Flowsheets:   ACT:      No data to display          MMRC:     No data to display          Epworth:      No data to display          Tests:   FENO:  No results found for: "NITRICOXIDE"  PFT:    Latest Ref Rng & Units 07/01/2017   10:34 AM 12/13/2015    9:13 AM  PFT Results  FVC-Pre L 0.98  1.37   FVC-Predicted Pre % 33  46   FVC-Post L  1.28   FVC-Predicted Post %  43   Pre FEV1/FVC % % 81  84   Post FEV1/FCV % %  83   FEV1-Pre L 0.79  1.14   FEV1-Predicted Pre % 35  50   FEV1-Post L  1.06   DLCO uncorrected ml/min/mmHg 11.25  12.40   DLCO UNC% % 52  57   DLCO corrected ml/min/mmHg 11.54  11.95   DLCO COR %Predicted % 53  55   DLVA Predicted % 111  98   TLC L  3.10   TLC % Predicted %  65   RV % Predicted %  73     WALK:     12/13/2015    9:52 AM  SIX MIN WALK  Tech Comments: o2 sat on RA at rest after 10 min no o2 was 98%- walked 1 min and sats dropped to 88% so started 2lpm o2-sats increased to 96% ra, stopped to rest x 2 and did not finish remaining 2 min of test due to knee pain. sats at end 91% 2lpm//lmr    Imaging: Personally reviewed, chest x-ray 2019 with cardiomegaly and mild interstitial prominence on my interpretation  Lab Results: Personally reviewed CBC    Component Value Date/Time   WBC 7.5 09/28/2019 1734   RBC 3.89 09/28/2019 1734   HGB 13.2 09/28/2019 1734   HGB 13.3 05/10/2019 1426   HCT 36.4 09/28/2019 1734   HCT 42.9 05/10/2019 1426   PLT 220 09/28/2019 1734   PLT 242 05/10/2019 1426   MCV 93.6 09/28/2019 1734   MCV 84 05/10/2019 1426   MCH 33.9 09/28/2019 1734   MCHC 36.3 (H) 09/28/2019 1734   RDW 19.4 (H) 09/28/2019 1734   RDW 14.2 05/10/2019 1426   LYMPHSABS 2.6 05/10/2019 1426   MONOABS 1.0 06/26/2017 2131   EOSABS 0.3 05/10/2019 1426   BASOSABS 0.1 05/10/2019 1426     BMET    Component Value Date/Time   NA 140 09/28/2019 1734   NA 139 07/05/2019 1557   K 4.3 09/28/2019 1734   CL 100 09/28/2019 1734   CO2 29 09/28/2019 1734   GLUCOSE 98 09/28/2019 1734   BUN 14 09/28/2019 1734   BUN 22 07/05/2019 1557   CREATININE 1.00 09/28/2019 1734   CALCIUM 8.8 (L) 09/28/2019 1734   GFRNONAA 58 (L) 09/28/2019 1734   GFRAA >60 09/28/2019 1734    BNP    Component Value Date/Time   BNP 76.4 09/28/2019 1734    ProBNP    Component Value Date/Time   PROBNP 112.0 (H) 08/26/2013 1616    Specialty Problems       Pulmonary Problems   Chronic cough   COPD (chronic obstructive pulmonary disease)Gold C    PFTs 06/15/2012: FeV1 57%  FeV1/FVC 70%  Fef 27 75 25% TLC 66%  DLCO 52%         Snoring    05/2012 Sleep study:  Normal, no significant sleep apnea.  AI: 1.6   Significant desaturation through the night seen      Chronic respiratory failure (HCC)   Obesity hypoventilation syndrome (HCC)   Restrictive lung disease   Exertional dyspnea    Allergies  Allergen Reactions   Gabapentin     "slept for 2 days"   Novocain [Procaine] Other (See Comments)    intolerance    Immunization History  Administered Date(s) Administered   Influenza Split 12/10/2011   Influenza Whole 01/09/2017   Influenza,inj,Quad PF,6+ Mos 12/04/2012, 01/17/2014, 12/13/2015, 01/25/2020   Moderna Sars-Covid-2 Vaccination 06/16/2019, 07/14/2019   PNEUMOCOCCAL CONJUGATE-20 08/23/2021   Pneumococcal Conjugate-13 01/17/2014   Pneumococcal Polysaccharide-23 01/26/2013    Past Medical History:  Diagnosis Date   Allergies    Anemia    Back pain    Bilateral swelling of feet    CHF (congestive heart failure) (HCC)    Chronic cough    COPD (chronic obstructive pulmonary disease) (HCC)    Depression    GERD (gastroesophageal reflux disease)    Heart murmur    Hypertension    Hypocapnia    Hypothyroid  Knee pain    Leg edema    chronic, bilateral   Lightheaded     Low back pain    Morbid obesity (HCC)    Neck pain    Obesity hypoventilation syndrome (HCC)    Oral lesion    Shortness of breath    Shortness of breath    Shoulder pain    Snoring     Tobacco History: Social History   Tobacco Use  Smoking Status Former   Packs/day: 2.00   Years: 45.00   Total pack years: 90.00   Types: Cigarettes   Quit date: 04/12/2015   Years since quitting: 6.3  Smokeless Tobacco Never  Tobacco Comments   still chews nicotine gum   Counseling given: Not Answered Tobacco comments: still chews nicotine gum   Continue to not smoke  Outpatient Encounter Medications as of 08/23/2021  Medication Sig   albuterol (PROVENTIL HFA;VENTOLIN HFA) 108 (90 BASE) MCG/ACT inhaler Inhale 2 puffs into the lungs every 6 (six) hours as needed for wheezing.    aspirin EC 81 MG EC tablet Take 1 tablet (81 mg total) by mouth daily.   baclofen (LIORESAL) 10 MG tablet Take 1 tablet by mouth 4 (four) times daily as needed for muscle spasms.    celecoxib (CELEBREX) 200 MG capsule Take 200 mg by mouth daily.   citalopram (CELEXA) 20 MG tablet Take 1 tablet (20 mg total) by mouth daily.   diclofenac sodium (VOLTAREN) 1 % GEL Apply 1 application topically 4 (four) times daily. Applied to knees, shoulders, wrists, and ankles   furosemide (LASIX) 40 MG tablet 2 tablets ('80mg'$ ) by mouth two times a day (Patient taking differently: 2 tablets ('80mg'$ ) by mouth two times a day  Takes 2 tables in morning and 1 tab in evening)   levothyroxine (SYNTHROID) 200 MCG tablet Take 1 tablet (200 mcg total) by mouth daily before breakfast.   levothyroxine (SYNTHROID) 25 MCG tablet Take 1 tab WITH LEVOTHYROXINE 200 MCG on Monday, Wednesday, and Friday.   metoprolol tartrate (LOPRESSOR) 25 MG tablet Take 0.5 tablets (12.5 mg total) by mouth 2 (two) times daily. (Patient taking differently: Take 12.5 mg by mouth daily.)   nicotine polacrilex (NICORETTE) 4 MG gum Take 4 mg by mouth as needed for smoking  cessation.   oxyCODONE-acetaminophen (PERCOCET/ROXICET) 5-325 MG tablet Take by mouth every 4 (four) hours as needed for severe pain.   potassium chloride SA (K-DUR,KLOR-CON) 20 MEQ tablet 2 tablets (40 mEq) by mouth in the AM and 1 tablet (20 mEq) by mouth in the PM   pregabalin (LYRICA) 100 MG capsule Take 1 capsule (100 mg total) by mouth 2 (two) times daily. (Patient taking differently: Take 50 mg by mouth 2 (two) times daily.)   pregabalin (LYRICA) 50 MG capsule 1 capsule Orally Twice a day 30 days   Spacer/Aero-Holding Chambers (AEROCHAMBER Z-STAT PLUS CHAMBR) MISC Use as directed   SPIRIVA HANDIHALER 18 MCG inhalation capsule PLACE 1 CAPSULE (18 MCG TOTAL) INTO INHALER AND INHALE DAILY.   SYMBICORT 160-4.5 MCG/ACT inhaler INHALE TWO puffs into lung TWICE DAILY   Vitamin D, Ergocalciferol, (DRISDOL) 1.25 MG (50000 UNIT) CAPS capsule Take 1 capsule (50,000 Units total) by mouth every 7 (seven) days.   [DISCONTINUED] buPROPion (WELLBUTRIN SR) 100 MG 12 hr tablet Take 100 mg by mouth every morning.   [DISCONTINUED] buPROPion (WELLBUTRIN) 100 MG tablet Take 100 mg by mouth daily.    [DISCONTINUED] diphenhydrAMINE (BENADRYL) 50 MG capsule Take 50 mg by mouth  every 6 (six) hours as needed.   [DISCONTINUED] umeclidinium bromide (INCRUSE ELLIPTA) 62.5 MCG/INH AEPB Inhale 1 puff into the lungs daily.   No facility-administered encounter medications on file as of 08/23/2021.     Review of Systems  Review of Systems  N/a  Physical Exam  BP 122/68 (BP Location: Left Arm, Patient Position: Sitting, Cuff Size: Normal)   Pulse 84   Temp 98.2 F (36.8 C) (Oral)   Ht '5\' 2"'$  (1.575 m)   Wt (!) 325 lb (147.4 kg) Comment: pt reported  SpO2 92%   BMI 59.44 kg/m   Wt Readings from Last 5 Encounters:  08/23/21 (!) 325 lb (147.4 kg)  01/25/20 299 lb (135.6 kg)  09/28/19 287 lb 14.7 oz (130.6 kg)  07/05/19 288 lb (130.6 kg)  06/14/19 294 lb (133.4 kg)    BMI Readings from Last 5 Encounters:   08/23/21 59.44 kg/m  01/25/20 54.69 kg/m  09/28/19 52.66 kg/m  07/05/19 52.68 kg/m  06/14/19 53.77 kg/m     Physical Exam General: Obese, sitting in wheelchair in no acute distress Eyes: EOMI, no icterus Neck: Supple, no JVD appreciated but difficult habitus Respiratory: Distant, clear to auscultation bilaterally Cardiovascular: Mild tachycardia, regular rhythm, chronic venous stasis changes of lower extremities, elephantiasis Psych: Normal mood, full affect   Assessment & Plan:   Chronic hypoxemic respiratory failure: Largely due to obesity hypoventilation syndrome, some contribution of possible COPD due to her history of cigarette smoking.  She is doing well on pulsed oxygen continuously day and night.  She did not tolerate noninvasive positive pressure ventilation for sleep disordered breathing and does not wish to pursue this in the future.  We will place an order for portable oxygen concentrator given decrease in battery life, need for new machine.  Ordering hospital bed to help with elevation of head of bed and legs to improve edema and respiratory status and avoid decompensation.  Dyspnea on exertion: Multifactorial related to obesity hypoventilation, sleep disordered breathing, prior cigarette smoking.  Prior spirometry personally reviewed and interpreted as suggestive of severe restriction with severely with DLCO but does correct significantly for alveolar volume suggestive of significant distribution of obesity to her restrictive physiology.  She is nonambulatory so difficult to assess it dyspnea on exertion is actually present and if interventions to improve this will be successful.  That being said, okay to continue Spiriva and Symbicort.  Healthcare maintenance: Has received Prevnar PPSV23, both over 5 years ago.  PCV 20 given today per CDC recommendations.  No further pneumonia vaccine should be needed.   Return in about 6 months (around 02/22/2022).   Lanier Clam, MD 08/23/2021

## 2021-08-23 NOTE — Patient Instructions (Signed)
Nice to see you again  We will put the order in for a bed that the head of bed and the foot of bed can be adjusted.  This will be through her DME company [employer].  We will put order for new portable oxygen concentrator given the battery is not lasting long  No changes to medications  After reviewing your vaccination records, you are due for a pneumonia vaccine.  This was ordered today.  We will give it to you.  Return to clinic in 6 months or sooner as needed with Dr. Silas Flood

## 2021-09-10 ENCOUNTER — Telehealth: Payer: Self-pay | Admitting: Pulmonary Disease

## 2021-09-10 NOTE — Telephone Encounter (Signed)
Called and spoke with Andee Poles who states they got the order for the hospital bed but needs to know why in the OV notes. Please see message below from [facility] received from [place] -   We will need a narrative for the HB :)    -replied back to [facility] to place a triage message.

## 2021-09-11 NOTE — Telephone Encounter (Signed)
Note addended with reasoning in assessment and plan

## 2021-09-12 NOTE — Telephone Encounter (Signed)
Spoke with Andee Poles and notified her the OV had been updated. Nothing further needed at this time.

## 2021-10-03 DIAGNOSIS — M539 Dorsopathy, unspecified: Secondary | ICD-10-CM | POA: Diagnosis not present

## 2021-10-03 DIAGNOSIS — J441 Chronic obstructive pulmonary disease with (acute) exacerbation: Secondary | ICD-10-CM | POA: Diagnosis not present

## 2021-10-03 DIAGNOSIS — J449 Chronic obstructive pulmonary disease, unspecified: Secondary | ICD-10-CM | POA: Diagnosis not present

## 2021-10-11 DIAGNOSIS — E662 Morbid (severe) obesity with alveolar hypoventilation: Secondary | ICD-10-CM | POA: Diagnosis not present

## 2021-10-11 DIAGNOSIS — R251 Tremor, unspecified: Secondary | ICD-10-CM | POA: Diagnosis not present

## 2021-10-11 DIAGNOSIS — F1120 Opioid dependence, uncomplicated: Secondary | ICD-10-CM | POA: Diagnosis not present

## 2021-10-11 DIAGNOSIS — B351 Tinea unguium: Secondary | ICD-10-CM | POA: Diagnosis not present

## 2021-10-11 DIAGNOSIS — E039 Hypothyroidism, unspecified: Secondary | ICD-10-CM | POA: Diagnosis not present

## 2021-10-11 DIAGNOSIS — Z79899 Other long term (current) drug therapy: Secondary | ICD-10-CM | POA: Diagnosis not present

## 2021-10-11 DIAGNOSIS — I130 Hypertensive heart and chronic kidney disease with heart failure and stage 1 through stage 4 chronic kidney disease, or unspecified chronic kidney disease: Secondary | ICD-10-CM | POA: Diagnosis not present

## 2021-10-11 DIAGNOSIS — J9611 Chronic respiratory failure with hypoxia: Secondary | ICD-10-CM | POA: Diagnosis not present

## 2021-10-11 DIAGNOSIS — I872 Venous insufficiency (chronic) (peripheral): Secondary | ICD-10-CM | POA: Diagnosis not present

## 2021-10-11 DIAGNOSIS — D508 Other iron deficiency anemias: Secondary | ICD-10-CM | POA: Diagnosis not present

## 2021-10-11 DIAGNOSIS — I890 Lymphedema, not elsewhere classified: Secondary | ICD-10-CM | POA: Diagnosis not present

## 2021-10-11 DIAGNOSIS — J449 Chronic obstructive pulmonary disease, unspecified: Secondary | ICD-10-CM | POA: Diagnosis not present

## 2021-10-17 ENCOUNTER — Encounter (INDEPENDENT_AMBULATORY_CARE_PROVIDER_SITE_OTHER): Payer: Self-pay

## 2021-10-31 DIAGNOSIS — J441 Chronic obstructive pulmonary disease with (acute) exacerbation: Secondary | ICD-10-CM | POA: Diagnosis not present

## 2021-10-31 DIAGNOSIS — M539 Dorsopathy, unspecified: Secondary | ICD-10-CM | POA: Diagnosis not present

## 2021-10-31 DIAGNOSIS — J449 Chronic obstructive pulmonary disease, unspecified: Secondary | ICD-10-CM | POA: Diagnosis not present

## 2021-11-09 DIAGNOSIS — J440 Chronic obstructive pulmonary disease with acute lower respiratory infection: Secondary | ICD-10-CM | POA: Diagnosis not present

## 2021-11-09 DIAGNOSIS — M539 Dorsopathy, unspecified: Secondary | ICD-10-CM | POA: Diagnosis not present

## 2021-11-09 DIAGNOSIS — J449 Chronic obstructive pulmonary disease, unspecified: Secondary | ICD-10-CM | POA: Diagnosis not present

## 2021-11-09 DIAGNOSIS — J441 Chronic obstructive pulmonary disease with (acute) exacerbation: Secondary | ICD-10-CM | POA: Diagnosis not present

## 2022-01-03 ENCOUNTER — Ambulatory Visit: Payer: Medicare HMO | Admitting: Podiatry

## 2022-03-19 ENCOUNTER — Encounter: Payer: Self-pay | Admitting: Pulmonary Disease

## 2022-04-11 ENCOUNTER — Ambulatory Visit: Payer: Medicare HMO | Admitting: Pulmonary Disease

## 2022-04-26 ENCOUNTER — Ambulatory Visit: Payer: 59 | Admitting: Pulmonary Disease

## 2022-06-20 ENCOUNTER — Ambulatory Visit: Payer: 59 | Admitting: Pulmonary Disease

## 2022-07-04 ENCOUNTER — Emergency Department (HOSPITAL_COMMUNITY): Payer: 59

## 2022-07-04 ENCOUNTER — Emergency Department (HOSPITAL_COMMUNITY)
Admission: EM | Admit: 2022-07-04 | Discharge: 2022-07-04 | Disposition: A | Discharge status: Home / Self Care | Payer: 59 | Attending: Emergency Medicine | Admitting: Emergency Medicine

## 2022-07-04 ENCOUNTER — Other Ambulatory Visit: Payer: Self-pay

## 2022-07-04 ENCOUNTER — Encounter (HOSPITAL_COMMUNITY): Payer: Self-pay

## 2022-07-04 DIAGNOSIS — E876 Hypokalemia: Secondary | ICD-10-CM | POA: Diagnosis not present

## 2022-07-04 DIAGNOSIS — T148XXA Other injury of unspecified body region, initial encounter: Secondary | ICD-10-CM

## 2022-07-04 DIAGNOSIS — J449 Chronic obstructive pulmonary disease, unspecified: Secondary | ICD-10-CM | POA: Insufficient documentation

## 2022-07-04 DIAGNOSIS — S8012XA Contusion of left lower leg, initial encounter: Secondary | ICD-10-CM | POA: Insufficient documentation

## 2022-07-04 DIAGNOSIS — S5011XA Contusion of right forearm, initial encounter: Secondary | ICD-10-CM | POA: Insufficient documentation

## 2022-07-04 DIAGNOSIS — I509 Heart failure, unspecified: Secondary | ICD-10-CM | POA: Diagnosis not present

## 2022-07-04 DIAGNOSIS — S51812A Laceration without foreign body of left forearm, initial encounter: Secondary | ICD-10-CM | POA: Insufficient documentation

## 2022-07-04 DIAGNOSIS — W19XXXA Unspecified fall, initial encounter: Secondary | ICD-10-CM | POA: Diagnosis not present

## 2022-07-04 DIAGNOSIS — I110 Hypertensive heart disease with heart failure: Secondary | ICD-10-CM | POA: Insufficient documentation

## 2022-07-04 DIAGNOSIS — M25512 Pain in left shoulder: Secondary | ICD-10-CM | POA: Diagnosis not present

## 2022-07-04 DIAGNOSIS — S51811A Laceration without foreign body of right forearm, initial encounter: Secondary | ICD-10-CM | POA: Diagnosis not present

## 2022-07-04 DIAGNOSIS — Y92009 Unspecified place in unspecified non-institutional (private) residence as the place of occurrence of the external cause: Secondary | ICD-10-CM | POA: Insufficient documentation

## 2022-07-04 DIAGNOSIS — Z7951 Long term (current) use of inhaled steroids: Secondary | ICD-10-CM | POA: Diagnosis not present

## 2022-07-04 DIAGNOSIS — Z79899 Other long term (current) drug therapy: Secondary | ICD-10-CM | POA: Insufficient documentation

## 2022-07-04 DIAGNOSIS — S59911A Unspecified injury of right forearm, initial encounter: Secondary | ICD-10-CM | POA: Diagnosis present

## 2022-07-04 DIAGNOSIS — S5012XA Contusion of left forearm, initial encounter: Secondary | ICD-10-CM | POA: Insufficient documentation

## 2022-07-04 DIAGNOSIS — E039 Hypothyroidism, unspecified: Secondary | ICD-10-CM | POA: Diagnosis not present

## 2022-07-04 DIAGNOSIS — S8011XA Contusion of right lower leg, initial encounter: Secondary | ICD-10-CM | POA: Insufficient documentation

## 2022-07-04 LAB — CBC WITH DIFFERENTIAL/PLATELET
Abs Immature Granulocytes: 0.07 10*3/uL (ref 0.00–0.07)
Basophils Absolute: 0 10*3/uL (ref 0.0–0.1)
Basophils Relative: 0 %
Eosinophils Absolute: 0.4 10*3/uL (ref 0.0–0.5)
Eosinophils Relative: 4 %
HCT: 44.1 % (ref 36.0–46.0)
Hemoglobin: 13.3 g/dL (ref 12.0–15.0)
Immature Granulocytes: 1 %
Lymphocytes Relative: 25 %
Lymphs Abs: 2.4 10*3/uL (ref 0.7–4.0)
MCH: 27.5 pg (ref 26.0–34.0)
MCHC: 30.2 g/dL (ref 30.0–36.0)
MCV: 91.1 fL (ref 80.0–100.0)
Monocytes Absolute: 0.9 10*3/uL (ref 0.1–1.0)
Monocytes Relative: 9 %
Neutro Abs: 5.8 10*3/uL (ref 1.7–7.7)
Neutrophils Relative %: 61 %
Platelets: 168 10*3/uL (ref 150–400)
RBC: 4.84 MIL/uL (ref 3.87–5.11)
RDW: 14.1 % (ref 11.5–15.5)
WBC: 9.6 10*3/uL (ref 4.0–10.5)
nRBC: 0 % (ref 0.0–0.2)

## 2022-07-04 LAB — BASIC METABOLIC PANEL
Anion gap: 10 (ref 5–15)
BUN: 19 mg/dL (ref 8–23)
CO2: 32 mmol/L (ref 22–32)
Calcium: 8.6 mg/dL — ABNORMAL LOW (ref 8.9–10.3)
Chloride: 93 mmol/L — ABNORMAL LOW (ref 98–111)
Creatinine, Ser: 1.04 mg/dL — ABNORMAL HIGH (ref 0.44–1.00)
GFR, Estimated: 58 mL/min — ABNORMAL LOW (ref >60)
Glucose, Bld: 91 mg/dL (ref 70–99)
Potassium: 5.3 mmol/L — ABNORMAL HIGH (ref 3.5–5.1)
Sodium: 135 mmol/L (ref 135–145)

## 2022-07-04 NOTE — Discharge Instructions (Signed)
Thank you for coming to [facility]. You were seen for fall. We did an exam, labs, and imaging, and these showed a mildly high potassium (5.3) and skin tears.   Please follow up with your primary care provider within 1 week.  To have your labs as well as your wounds rechecked.  Please keep your wounds clean and dry with clean dressings every 1 to 2 days.  Do not soak in a bath or hot tub.  You can use antibiotic ointment as well.  Do not hesitate to return to the ED or call 911 if you experience: -Worsening symptoms -Lightheadedness, passing out -Fevers/chills -Anything else that concerns you

## 2022-07-04 NOTE — ED Triage Notes (Addendum)
Patient fell onto her husband while going to her doctors appointment. Has lymphedema in both legs. On 4L [place] at baseline. In triage oxygen 80%. Superficial abrasions on the left shin. Did not hit her head while falling.

## 2022-07-04 NOTE — ED Provider Notes (Signed)
[place] EMERGENCY DEPARTMENT AT [facility] Provider Note   CSN: [id number] Arrival date & time: 07/04/22  1732     History  Chief Complaint  Patient presents with   Morgan Odonnell is a 72 y.o. female with COPD, chronic resp failure, HTN, chronic diastolic CHF, lymphedema, cKD stage 2, obesity, who presents with fall.   Patient fell down the stairs onto her husband while going to her doctors appointment. Has lymphedema in both legs. On 4L [place] at baseline. In triage oxygen 80%, now stable on her home O2. Presented to the ED d/t concern for skin tears on BL UEs and LEs that occurred during the fall. Husband at bedside states that he had grabbed onto her arms very hard to try to prevent her from falling and thinks he accidentally tore her skin. Patient doesn't take a blood thinner. She did not hit her head or lose consciousness in the fall. She has pain on her left shoulder after the fall but denies numbness/tingling, decreased ROM. She normally uses her walker but they tried to go without it today and she had the fall. She otherwise has been in her [facility] and husband states she fell today because they tried something different.   Fall      Home Medications Prior to Admission medications   Medication Sig Start Date End Date Taking? Authorizing Provider  albuterol (PROVENTIL HFA;VENTOLIN HFA) 108 (90 BASE) MCG/ACT inhaler Inhale 2 puffs into the lungs every 6 (six) hours as needed for wheezing.     [provider]  aspirin EC 81 MG EC tablet Take 1 tablet (81 mg total) by mouth daily. 07/05/17   Noralee Stain, DO  baclofen (LIORESAL) 10 MG tablet Take 1 tablet by mouth 4 (four) times daily as needed for muscle spasms.     [provider]  budesonide-formoterol (SYMBICORT) 160-4.5 MCG/ACT inhaler INHALE TWO puffs into lung TWICE DAILY 07/09/22   Hunsucker, Lesia Sago, MD  celecoxib (CELEBREX) 200 MG capsule Take 200 mg by mouth daily.    [provider]  citalopram (CELEXA) 20 MG tablet Take 1 tablet (20 mg total) by mouth daily. 07/04/17   Noralee Stain, DO  diclofenac sodium (VOLTAREN) 1 % GEL Apply 1 application topically 4 (four) times daily. Applied to knees, shoulders, wrists, and ankles    [provider]  furosemide (LASIX) 40 MG tablet 2 tablets (80mg ) by mouth two times a day Patient taking differently: 2 tablets (80mg ) by mouth two times a day  Takes 2 tables in morning and 1 tab in evening 08/18/15   Laurey Morale, MD  levothyroxine (SYNTHROID) 200 MCG tablet Take 1 tablet (200 mcg total) by mouth daily before breakfast. 03/24/19   Helane Rima, DO  levothyroxine (SYNTHROID) 25 MCG tablet Take 1 tab WITH LEVOTHYROXINE 200 MCG on Monday, Wednesday, and Friday. 10/26/19   Helane Rima, DO  metoprolol tartrate (LOPRESSOR) 25 MG tablet Take 0.5 tablets (12.5 mg total) by mouth 2 (two) times daily. Patient taking differently: Take 12.5 mg by mouth daily. 07/04/17   Noralee Stain, DO  nicotine polacrilex (NICORETTE) 4 MG gum Take 4 mg by mouth as needed for smoking cessation.    [provider]  oxyCODONE-acetaminophen (PERCOCET/ROXICET) 5-325 MG tablet Take by mouth every 4 (four) hours as needed for severe pain.    [provider]  potassium chloride SA (K-DUR,KLOR-CON) 20 MEQ tablet 2 tablets (40 mEq) by mouth in the AM and  1 tablet (20 mEq) by mouth in the PM 10/09/16   Laurey Morale, MD  pregabalin (LYRICA) 100 MG capsule Take 1 capsule (100 mg total) by mouth 2 (two) times daily. Patient taking differently: Take 50 mg by mouth 2 (two) times daily. 07/04/17   Noralee Stain, DO  pregabalin (LYRICA) 50 MG capsule 1 capsule Orally Twice a day 30 days 12/15/19   [provider]  Spacer/Aero-Holding Chambers (AEROCHAMBER Z-STAT PLUS [place]) MISC Use as directed 05/10/15   Roslynn Amble, MD  tiotropium (SPIRIVA HANDIHALER) 18 MCG inhalation capsule Place 1 capsule (18 mcg total) into  inhaler and inhale daily. 07/09/22   Hunsucker, Lesia Sago, MD  Vitamin D, Ergocalciferol, (DRISDOL) 1.25 MG (50000 UNIT) CAPS capsule Take 1 capsule (50,000 Units total) by mouth every 7 (seven) days. 05/10/19   Helane Rima, DO      Allergies    Gabapentin and Novocain [procaine]    Review of Systems   Review of Systems Review of systems Negative for head trauma, LOC.  A 10 point review of systems was performed and is negative unless otherwise reported in HPI.  Physical Exam Updated Vital Signs BP 137/70   Pulse 65   Temp 97.6 F (36.4 C) (Oral)   Resp 15   Ht 5\' 2"  (1.575 m)   Wt (!) 158.8 kg   SpO2 100%   BMI 64.02 kg/m  Physical Exam General: Chronically ill-appearing obese female, lying in bed.  HEENT: PERRLA, Sclera anicteric, MMM, trachea midline. NCAT, no midline C-spine TTP deformities or stepoffs. Cardiology: RRR, no murmurs/rubs/gallops. BL radial and DP pulses equal bilaterally.  Resp: Normal respiratory rate and effort. CTAB, no wheezes, rhonchi, crackles. On 4L [place] home baseline.  Abd: Soft, non-tender, non-distended. No rebound tenderness or guarding.  GU: Deferred. MSK: Scattered ecchymoses on bilateral forearms and shins. Several skin tears, largest ~10 cm, on bilateral forearms. Not grossly contaminated. No lacerations. All wounds hemostatic. Diffuse general TTP to L shoulder without any point tenderness, gross deformities, or signs of trauma. Intact ROM active/passive of L shoulder/elbow/wrist.   Skin: warm, dry.  Back: No midline C t or L  spine TTP.  Neuro: A&Ox4, CNs II-XII grossly intact. MAEs. Sensation grossly intact.  Psych: Normal mood and affect.   ED Results / Procedures / Treatments   Labs (all labs ordered are listed, but only abnormal results are displayed) Labs Reviewed  BASIC METABOLIC PANEL - Abnormal; Notable for the following components:      Result Value   Potassium 5.3 (*)    Chloride 93 (*)    Creatinine, Ser 1.04 (*)    Calcium 8.6  (*)    GFR, Estimated 58 (*)    All other components within normal limits  CBC WITH DIFFERENTIAL/PLATELET    EKG EKG Interpretation  Date/Time:  Thursday July 04 2022 17:52:56 EDT Ventricular Rate:  68 PR Interval:  214 QRS Duration: 97 QT Interval:  434 QTC Calculation: 462 R Axis:   14 Text Interpretation: Sinus rhythm Borderline prolonged PR interval Confirmed by Vivi Barrack [phone]) on 07/04/2022 7:33:29 PM  Radiology No results found.  Procedures Procedures    Medications Ordered in ED Medications - No data to display  ED Course/ Medical Decision Making/ A&P                          Medical Decision Making Amount and/or Complexity of Data Reviewed Labs: ordered. Decision-making details documented in ED  Course. Radiology: ordered. Decision-making details documented in ED Course.    This patient presents to the ED for concern of fall, skin tears, shoulder pain, this involves an extensive number of treatment options, and is a complaint that carries with it a high risk of complications and morbidity.   MDM:    Patient with fall, only has pain in Left shoulder and in skin tears noted on extremities. No lacerations to be repaired. Wounds are not grossly contaminated. Doubtful dislocation/fracture of L shoulder but will obtain XR to further evaluate. Will also obtain basic labs/EKG to evaluate cause of fall however fall today was in s/o not using walker as she normally does. Wounds do not require repair, will be cleaned and dressed.   Clinical Course as of 07/16/22 1151  Thu Jul 04, 2022  1922 Potassium(!): 5.3 Pt with very mildly elevated potassium. Has more history of hypokalemia but did have hyperkalemia during admission 2019 for polypharmacy. Patient does take lasix at home. Denies any recent changes to meds. [HN]  1933 EKG without any significant changes [HN]  1933 CBC with Differential wnl [HN]  1934 DG Shoulder Left Portable No fracture/dislocation. [HN]     Clinical Course User Index [HN] Loetta Rough, MD    Labs: I Ordered, and personally interpreted labs.  The pertinent results include:  those listed above  Imaging Studies ordered: I ordered imaging studies including L shoulder I independently visualized and interpreted imaging. I agree with the radiologist interpretation  Additional history obtained from husband at bedside, chart review.    Cardiac Monitoring: The patient was maintained on a cardiac monitor.  I personally viewed and interpreted the cardiac monitored which showed an underlying rhythm of: NSR  Social Determinants of Health: Patient lives independently with her husband as caretaker.  Disposition:  W/u reassuring. Patient feels at baseline. Wounds cleaned/dressed. Instructed to f/u with PCP, states she has appt with pulmonology in a few days. DC w/ discharge instructions/return precautions. All questions answered to patient's satisfaction.    Co morbidities that complicate the patient evaluation  Past Medical History:  Diagnosis Date   Allergies    Anemia    Back pain    Bilateral swelling of feet    CHF (congestive heart failure) (HCC)    Chronic cough    COPD (chronic obstructive pulmonary disease) (HCC)    Depression    GERD (gastroesophageal reflux disease)    Heart murmur    Hypertension    Hypocapnia    Hypothyroid    Knee pain    Leg edema    chronic, bilateral   Lightheaded    Low back pain    Morbid obesity (HCC)    Neck pain    Obesity hypoventilation syndrome (HCC)    Oral lesion    Shortness of breath    Shortness of breath    Shoulder pain    Snoring      Medicines No orders of the defined types were placed in this encounter.   I have reviewed the patients home medicines and have made adjustments as needed  Problem List / ED Course: Problem List Items Addressed This Visit   None Visit Diagnoses     Fall in home, initial encounter    -  Primary   Multiple skin tears                        This note was created using dictation software, which may contain  spelling or grammatical errors.    Loetta Rough, MD 07/16/22 [phone]

## 2022-07-09 ENCOUNTER — Encounter: Payer: Self-pay | Admitting: Pulmonary Disease

## 2022-07-09 ENCOUNTER — Ambulatory Visit (INDEPENDENT_AMBULATORY_CARE_PROVIDER_SITE_OTHER): Payer: 59 | Admitting: Pulmonary Disease

## 2022-07-09 VITALS — HR 64 | Temp 98.4°F

## 2022-07-09 DIAGNOSIS — J418 Mixed simple and mucopurulent chronic bronchitis: Secondary | ICD-10-CM

## 2022-07-09 MED ORDER — TIOTROPIUM BROMIDE MONOHYDRATE 18 MCG IN CAPS: 1.0000 | ORAL_CAPSULE | Freq: Every day | RESPIRATORY_TRACT | 11 refills | Status: AC

## 2022-07-09 MED ORDER — BUDESONIDE-FORMOTEROL FUMARATE 160-4.5 MCG/ACT IN AERO: INHALATION_SPRAY | RESPIRATORY_TRACT | 11 refills | Status: DC

## 2022-07-09 NOTE — Patient Instructions (Signed)
Nice to see you again  I refilled the Symbicort and Spiriva  Please let me know if they are too expensive sometimes insurance stopped covering certain things.  If so I can look for more cost effective alternative.  We will send a message or order to adapt to come to service your oxygen concentrator is particularly the portable oxygen concentrator.  Return to clinic in 6 months or sooner as needed with Dr. Judeth Horn

## 2022-07-10 NOTE — Progress Notes (Signed)
Patient ID: Morgan Odonnell, female    DOB: 1950-06-30, 72 y.o.   MRN: [id number]  Chief Complaint  Patient presents with   Follow-up    Occ cough, 3L oxygen,     Referring provider: Garlan Fillers, MD  HPI:   72 y.o.woman with OHS/OSA (elevated PCO2 and chronically elevated bicarb) previously followed in this clinic whom I am seeing  in follow-up for chronic hypoxemic restaurant failure.  Last seen over 10-11 months ago.  Using 3L [place].  No change in breathing for last visit.  Baseline dyspnea persist.  Baseline hypoxemia unchanged.  Oxygen saturation the mid 80s on her pulse device today.  Flow rate does not sound adequate.  1 over the clogged filter malfunctioning POC.  Placed on nasal cannula continuous and oxygen saturation acceptable.  Discussed we will see if we can get this fixed but in the meantime she needs to minimize time out of the house to stay on her home oxygen concentrator with continuous flow.  Overall her weight is up 25 pound since last visit.  She has significant lymphedema she denies is any worse states it is okay.  She recently fell.  ED note reviewed.  A lot of bruising and skin tears.  HPI at initial visit: Overall, she is doing quite well.  She feels her breathing is at baseline if not somewhat better than usual.  She attributes this largely to 35 pound weight loss in the setting of seeing provider at weight loss clinic.  She continues to plan to lose weight.  She is frustrated by inability to to receive elective procedures as colonoscopy which she lost her sister-in-law to as well as gastric bypass due to her weight and breathing issues.  No cough.  She not very active.  She has chronic lower extremity swelling she does not feels any worse.  No fever or chills.  No cough.    Questionaires / Pulmonary Flowsheets:   ACT:      No data to display           MMRC:     No data to display           Epworth:      No data to display            Tests:   FENO:  No results found for: "NITRICOXIDE"  PFT:    Latest Ref Rng & Units 07/01/2017   10:34 AM 12/13/2015    9:13 AM  PFT Results  FVC-Pre L 0.98  1.37   FVC-Predicted Pre % 33  46   FVC-Post L  1.28   FVC-Predicted Post %  43   Pre FEV1/FVC % % 81  84   Post FEV1/FCV % %  83   FEV1-Pre L 0.79  1.14   FEV1-Predicted Pre % 35  50   FEV1-Post L  1.06   DLCO uncorrected ml/min/mmHg 11.25  12.40   DLCO UNC% % 52  57   DLCO corrected ml/min/mmHg 11.54  11.95   DLCO COR %Predicted % 53  55   DLVA Predicted % 111  98   TLC L  3.10   TLC % Predicted %  65   RV % Predicted %  73     WALK:     12/13/2015    9:52 AM  SIX MIN WALK  Tech Comments: o2 sat on RA at rest after 10 min no o2 was 98%- walked 1 min and sats dropped to  88% so started 2lpm o2-sats increased to 96% ra, stopped to rest x 2 and did not finish remaining 2 min of test due to knee pain. sats at end 91% 2lpm//lmr    Imaging: Personally reviewed, chest x-ray 2019 with cardiomegaly and mild interstitial prominence on my interpretation  Lab Results: Personally reviewed CBC    Component Value Date/Time   WBC 9.6 07/04/2022 1800   RBC 4.84 07/04/2022 1800   HGB 13.3 07/04/2022 1800   HGB 13.3 05/10/2019 1426   HCT 44.1 07/04/2022 1800   HCT 42.9 05/10/2019 1426   PLT 168 07/04/2022 1800   PLT 242 05/10/2019 1426   MCV 91.1 07/04/2022 1800   MCV 84 05/10/2019 1426   MCH 27.5 07/04/2022 1800   MCHC 30.2 07/04/2022 1800   RDW 14.1 07/04/2022 1800   RDW 14.2 05/10/2019 1426   LYMPHSABS 2.4 07/04/2022 1800   LYMPHSABS 2.6 05/10/2019 1426   MONOABS 0.9 07/04/2022 1800   EOSABS 0.4 07/04/2022 1800   EOSABS 0.3 05/10/2019 1426   BASOSABS 0.0 07/04/2022 1800   BASOSABS 0.1 05/10/2019 1426    BMET    Component Value Date/Time   NA 135 07/04/2022 1800   NA 139 07/05/2019 1557   K 5.3 (H) 07/04/2022 1800   CL 93 (L) 07/04/2022 1800   CO2 32 07/04/2022 1800   GLUCOSE 91 07/04/2022 1800    BUN 19 07/04/2022 1800   BUN 22 07/05/2019 1557   CREATININE 1.04 (H) 07/04/2022 1800   CALCIUM 8.6 (L) 07/04/2022 1800   GFRNONAA 58 (L) 07/04/2022 1800   GFRAA >60 09/28/2019 1734    BNP    Component Value Date/Time   BNP 76.4 09/28/2019 1734    ProBNP    Component Value Date/Time   PROBNP 112.0 (H) 08/26/2013 1616    Specialty Problems       Pulmonary Problems   Chronic cough   COPD (chronic obstructive pulmonary disease)Gold C    PFTs 06/15/2012: FeV1 57%  FeV1/FVC 70%  Fef 27 75 25% TLC 66%  DLCO 52%         Snoring    05/2012 Sleep study:  Normal, no significant sleep apnea.  AI: 1.6   Significant desaturation through the night seen      Chronic respiratory failure (HCC)   Obesity hypoventilation syndrome (HCC)   Restrictive lung disease   Exertional dyspnea    Allergies  Allergen Reactions   Gabapentin     "slept for 2 days"   Novocain [Procaine] Other (See Comments)    intolerance    Immunization History  Administered Date(s) Administered   Influenza Split 12/10/2011   Influenza Whole 01/09/2017   Influenza,inj,Quad PF,6+ Mos 12/04/2012, 01/17/2014, 12/13/2015, 01/25/2020   Moderna Sars-Covid-2 Vaccination 06/16/2019, 07/14/2019   PNEUMOCOCCAL CONJUGATE-20 08/23/2021   Pneumococcal Conjugate-13 01/17/2014   Pneumococcal Polysaccharide-23 01/26/2013    Past Medical History:  Diagnosis Date   Allergies    Anemia    Back pain    Bilateral swelling of feet    CHF (congestive heart failure) (HCC)    Chronic cough    COPD (chronic obstructive pulmonary disease) (HCC)    Depression    GERD (gastroesophageal reflux disease)    Heart murmur    Hypertension    Hypocapnia    Hypothyroid    Knee pain    Leg edema    chronic, bilateral   Lightheaded    Low back pain    Morbid obesity (HCC)  Neck pain    Obesity hypoventilation syndrome (HCC)    Oral lesion    Shortness of breath    Shortness of breath    Shoulder pain    Snoring      Tobacco History: Social History   Tobacco Use  Smoking Status Former   Packs/day: 2.00   Years: 45.00   Additional pack years: 0.00   Total pack years: 90.00   Types: Cigarettes   Quit date: 04/12/2015   Years since quitting: 7.2  Smokeless Tobacco Never  Tobacco Comments   still chews nicotine gum   Counseling given: Not Answered Tobacco comments: still chews nicotine gum   Continue to not smoke  Outpatient Encounter Medications as of 07/09/2022  Medication Sig   albuterol (PROVENTIL HFA;VENTOLIN HFA) 108 (90 BASE) MCG/ACT inhaler Inhale 2 puffs into the lungs every 6 (six) hours as needed for wheezing.    aspirin EC 81 MG EC tablet Take 1 tablet (81 mg total) by mouth daily.   baclofen (LIORESAL) 10 MG tablet Take 1 tablet by mouth 4 (four) times daily as needed for muscle spasms.    celecoxib (CELEBREX) 200 MG capsule Take 200 mg by mouth daily.   citalopram (CELEXA) 20 MG tablet Take 1 tablet (20 mg total) by mouth daily.   diclofenac sodium (VOLTAREN) 1 % GEL Apply 1 application topically 4 (four) times daily. Applied to knees, shoulders, wrists, and ankles   furosemide (LASIX) 40 MG tablet 2 tablets (80mg ) by mouth two times a day (Patient taking differently: 2 tablets (80mg ) by mouth two times a day  Takes 2 tables in morning and 1 tab in evening)   levothyroxine (SYNTHROID) 200 MCG tablet Take 1 tablet (200 mcg total) by mouth daily before breakfast.   levothyroxine (SYNTHROID) 25 MCG tablet Take 1 tab WITH LEVOTHYROXINE 200 MCG on Monday, Wednesday, and Friday.   metoprolol tartrate (LOPRESSOR) 25 MG tablet Take 0.5 tablets (12.5 mg total) by mouth 2 (two) times daily. (Patient taking differently: Take 12.5 mg by mouth daily.)   nicotine polacrilex (NICORETTE) 4 MG gum Take 4 mg by mouth as needed for smoking cessation.   oxyCODONE-acetaminophen (PERCOCET/ROXICET) 5-325 MG tablet Take by mouth every 4 (four) hours as needed for severe pain.   potassium chloride SA  (K-DUR,KLOR-CON) 20 MEQ tablet 2 tablets (40 mEq) by mouth in the AM and 1 tablet (20 mEq) by mouth in the PM   pregabalin (LYRICA) 100 MG capsule Take 1 capsule (100 mg total) by mouth 2 (two) times daily. (Patient taking differently: Take 50 mg by mouth 2 (two) times daily.)   pregabalin (LYRICA) 50 MG capsule 1 capsule Orally Twice a day 30 days   Spacer/Aero-Holding Chambers (AEROCHAMBER Z-STAT PLUS [place]AMBR) MISC Use as directed   Vitamin D, Ergocalciferol, (DRISDOL) 1.25 MG (50000 UNIT) CAPS capsule Take 1 capsule (50,000 Units total) by mouth every 7 (seven) days.   [DISCONTINUED] SPIRIVA HANDIHALER 18 MCG inhalation capsule PLACE 1 CAPSULE (18 MCG TOTAL) INTO INHALER AND INHALE DAILY.   [DISCONTINUED] SYMBICORT 160-4.5 MCG/ACT inhaler INHALE TWO puffs into lung TWICE DAILY   budesonide-formoterol (SYMBICORT) 160-4.5 MCG/ACT inhaler INHALE TWO puffs into lung TWICE DAILY   tiotropium (SPIRIVA HANDIHALER) 18 MCG inhalation capsule Place 1 capsule (18 mcg total) into inhaler and inhale daily.   No facility-administered encounter medications on file as of 07/09/2022.     Review of Systems  Review of Systems  N/a  Physical Exam  Pulse 64   Temp 98.4  F (36.9 C) (Oral)   SpO2 (!) 83%   Wt Readings from Last 5 Encounters:  07/04/22 (!) 350 lb (158.8 kg)  08/23/21 (!) 325 lb (147.4 kg)  01/25/20 299 lb (135.6 kg)  09/28/19 287 lb 14.7 oz (130.6 kg)  07/05/19 288 lb (130.6 kg)    BMI Readings from Last 5 Encounters:  07/04/22 64.02 kg/m  08/23/21 59.44 kg/m  01/25/20 54.69 kg/m  09/28/19 52.66 kg/m  07/05/19 52.68 kg/m     Physical Exam General: Obese, sitting in wheelchair in no acute distress Eyes: EOMI, no icterus Neck: Supple, no JVD appreciated but difficult habitus Respiratory: Distant, clear to auscultation bilaterally Cardiovascular: Mild tachycardia, regular rhythm, chronic venous stasis changes of lower extremities, elephantiasis Psych: Normal mood, full  affect   Assessment & Plan:   Chronic hypoxemic respiratory failure: Largely due to obesity hypoventilation syndrome, some contribution of possible COPD due to her history of cigarette smoking.  She is doing well on pulsed oxygen continuously day and night.  She did not tolerate noninvasive positive pressure ventilation for sleep disordered breathing and does not wish to pursue this in the future.  New order for DME company to service her portable oxygen concentrator, previously malfunctioning got a new 1 but this 1 also seems to be malfunctioning.  Instructed to stay on home concentrator with continuous flow until this can be sorted out.  She should not leave the home without a tank for continuous-flow without leaving the house.  Dyspnea on exertion: Multifactorial related to obesity hypoventilation, sleep disordered breathing, prior cigarette smoking.  Prior spirometry personally reviewed and interpreted as suggestive of severe restriction with severely with DLCO but does correct significantly for alveolar volume suggestive of significant distribution of obesity to her restrictive physiology.  She is nonambulatory so difficult to assess it dyspnea on exertion is actually present and if interventions to improve this will be successful.  That being said, okay to continue Spiriva and Symbicort.  Both refilled today.  Healthcare maintenance: Has received Prevnar PPSV23, both over 5 years ago.  PCV 20 given 08/2021 per CDC recommendations.  No further pneumonia vaccine should be needed.   Return in about 6 months (around 01/08/2023) for f/u Dr. Judeth Horn.   Karren Burly, MD 07/10/2022

## 2022-10-08 ENCOUNTER — Telehealth: Payer: Self-pay | Admitting: Pulmonary Disease

## 2022-10-08 NOTE — Telephone Encounter (Signed)
Patient states having symptom of cough. [facility]acy is [facility] [place]. Also would like Breztri inhaler called into pharmacy.  Patient phone number is [phone].

## 2022-10-08 NOTE — Telephone Encounter (Signed)
ATC patient.  LM for patient to call back when available.  

## 2022-10-10 NOTE — Telephone Encounter (Signed)
Called pt again and still no answer- LMTCB and closing encounter per protocol.

## 2022-10-30 ENCOUNTER — Telehealth: Payer: Self-pay | Admitting: Pulmonary Disease

## 2022-10-30 NOTE — Telephone Encounter (Signed)
Patient is calling wanting to try a new medication instead of taking her inhaler but she cannot remember the name of the medication. Please call and advise. Her number is [phone] [name] on [address] [place]

## 2022-11-06 NOTE — Telephone Encounter (Signed)
Spoke with patient regarding prior message   Morgan Odonnell is calling wanting to try a new medication instead of taking her inhaler but she cannot remember the name of the medication. Please call and advise. Her number is [phone] [name]   Patient stated she will like to try [name] but patient was not sure.  Dr.Hunsucker can you please advise.  Thank you .

## 2022-11-06 NOTE — Telephone Encounter (Signed)
Contacted patient via phone.  Long and frankly difficult to follow conversation.  We discussed Breztri replacing Spiriva and Symbicort.  She states that she thinks Virgel Bouquet gives her yeast infections.  I told her not know if this is the case but if she thinks so we could stop it.  Okay to stop Spiriva, continue Symbicort.  If we need to resume Spiriva, if her breathing worsens, we should.  She was instructed on this.  If we end up on triple inhaled therapy in the coming weeks then we could look at substituting Breztri for the 2 inhalers, Spiriva and Symbicort.  All questions answered to the best of ability.

## 2022-12-10 ENCOUNTER — Ambulatory Visit (HOSPITAL_BASED_OUTPATIENT_CLINIC_OR_DEPARTMENT_OTHER): Payer: 59 | Admitting: Internal Medicine

## 2023-01-09 ENCOUNTER — Encounter (HOSPITAL_BASED_OUTPATIENT_CLINIC_OR_DEPARTMENT_OTHER): Payer: 59 | Attending: Internal Medicine | Admitting: Internal Medicine

## 2023-01-09 DIAGNOSIS — Z9981 Dependence on supplemental oxygen: Secondary | ICD-10-CM | POA: Insufficient documentation

## 2023-01-09 DIAGNOSIS — Z6841 Body Mass Index (BMI) 40.0 and over, adult: Secondary | ICD-10-CM | POA: Diagnosis not present

## 2023-01-09 DIAGNOSIS — Q820 Hereditary lymphedema: Secondary | ICD-10-CM | POA: Diagnosis not present

## 2023-01-09 DIAGNOSIS — I110 Hypertensive heart disease with heart failure: Secondary | ICD-10-CM | POA: Insufficient documentation

## 2023-01-09 DIAGNOSIS — G4730 Sleep apnea, unspecified: Secondary | ICD-10-CM | POA: Diagnosis not present

## 2023-01-09 DIAGNOSIS — I5032 Chronic diastolic (congestive) heart failure: Secondary | ICD-10-CM | POA: Diagnosis not present

## 2023-01-09 DIAGNOSIS — J449 Chronic obstructive pulmonary disease, unspecified: Secondary | ICD-10-CM | POA: Insufficient documentation

## 2023-01-09 DIAGNOSIS — L97828 Non-pressure chronic ulcer of other part of left lower leg with other specified severity: Secondary | ICD-10-CM | POA: Diagnosis present

## 2023-01-09 DIAGNOSIS — J9610 Chronic respiratory failure, unspecified whether with hypoxia or hypercapnia: Secondary | ICD-10-CM | POA: Diagnosis not present

## 2023-01-10 NOTE — Progress Notes (Signed)
Morgan Odonnell ([id number]) [phone].pdf Page 1 of 10 Visit Report for 01/09/2023 Allergy List Details Patient Name: Date of Service: [place] RIE S. 01/09/2023 12:30 PM Medical Record Number: [id number] Patient Account Number: [id number] Date of Birth/Sex: Treating RN: [birth year] (72 y.o. Katrinka Blazing Primary Care Marigny Borre: [name] Other Clinician: Referring Maybelline Kolarik: Treating Abner Ardis/Extender: Theodis Sato in Treatment: 0 Allergies Active Allergies No Known Allergies Allergy Notes Electronic Signature(s) Signed: 01/10/2023 12:02:50 PM By: Karie Schwalbe RN Entered By: Karie Schwalbe on 01/09/2023 13:12:00 -------------------------------------------------------------------------------- Arrival Information Details Patient Name: Date of Service: [place] RIE S. 01/09/2023 12:30 PM Medical Record Number: [id number] Patient Account Number: [id number] Date of Birth/Sex: Treating RN: [birth year] (72 y.o. Katrinka Blazing Primary Care Irma Delancey: [name] Other Clinician: Referring Tamie Minteer: Treating Onaje Warne/Extender: Theodis Sato in Treatment: 0 Visit Information Patient Arrived: Wheel Chair Arrival Time: 13:07 Accompanied By: husband Transfer Assistance: Manual Patient Identification Verified: Yes History Since Last Visit Added or deleted any medications: No Any new allergies or adverse reactions: No Had a fall or experienced change in activities of daily living that may affect risk of falls: No Signs or symptoms of abuse/neglect since last visito No Hospitalized since last visit: No Implantable device outside of the clinic excluding cellular tissue based products placed in the center since last visit: No Notes 2 person assist Electronic Signature(s) Signed: 01/10/2023 12:02:50 PM By: Karie Schwalbe RN Entered By: Karie Schwalbe on 01/09/2023  13:08:34 Morgan Odonnell ([id number]) [id number] Page 2 of 10 -------------------------------------------------------------------------------- Compression Therapy Details Patient Name: Date of Service: [place] RIE S. 01/09/2023 12:30 PM Medical Record Number: [id number] Patient Account Number: [id number] Date of Birth/Sex: Treating RN: October 30, 1950 (72 y.o. Katrinka Blazing Primary Care Atianna Haidar: [name] Other Clinician: Referring Khalib Fendley: Treating Shawnta Schlegel/Extender: Theodis Sato in Treatment: 0 Compression Therapy Performed for Wound Assessment: Wound #3 Left,Proximal,Lateral Lower Leg Performed By: Clinician Karie Schwalbe, RN Compression Type: Henriette Combs Post Procedure Diagnosis Same as Pre-procedure Electronic Signature(s) Signed: 01/10/2023 12:02:50 PM By: Karie Schwalbe RN Entered By: Karie Schwalbe on 01/09/2023 14:29:21 -------------------------------------------------------------------------------- Compression Therapy Details Patient Name: Date of Service: [place] RIE S. 01/09/2023 12:30 PM Medical Record Number: [id number] Patient Account Number: [id number] Date of Birth/Sex: Treating RN: 1950-08-24 (72 y.o. Katrinka Blazing Primary Care Tanaja Ganger: [name] Other Clinician: Referring Dasan Hardman: Treating Stepheni Cameron/Extender: Theodis Sato in Treatment: 0 Compression Therapy Performed for Wound Assessment: Wound #4 Left,Distal,Lateral Lower Leg Performed By: Clinician Karie Schwalbe, RN Compression Type: Henriette Combs Post Procedure Diagnosis Same as Pre-procedure Electronic Signature(s) Signed: 01/10/2023 12:02:50 PM By: Karie Schwalbe RN Entered By: Karie Schwalbe on 01/09/2023 14:29:21 -------------------------------------------------------------------------------- Encounter Discharge Information Details Patient Name: Date of Service: [place]  RIE S. 01/09/2023 12:30 PM Medical Record Number: [id number] Patient Account Number: [id number] Date of Birth/Sex: Treating RN: [birth year] (72 y.o. Katrinka Blazing Primary Care Eiliana Drone: [name] Other Clinician: Referring Richetta Cubillos: Treating Stefan Markarian/Extender: Theodis Sato in Treatment: 0 Encounter Discharge Information Items Discharge Condition: Stable Ambulatory Status: Wheelchair Discharge Destination: Home Morgan Odonnell, Morgan Odonnell ([id number]) [id number] Page 3 of 10 Transportation: Private Auto Accompanied By: spouse Schedule Follow-up Appointment: Yes Clinical Summary of Care: Patient Declined Electronic Signature(s) Signed: 01/10/2023 12:02:50 PM By: Karie Schwalbe RN Entered By: Karie Schwalbe on 01/09/2023 16:45:11 -------------------------------------------------------------------------------- Lower Extremity Assessment Details Patient Name: Date of Service: [place] RIE S. 01/09/2023 12:30 PM Medical  Record Number: [id number] Patient Account Number: [id number] Date of Birth/Sex: Treating RN: May 06, 1950 (72 y.o. Katrinka Blazing Primary Care Tal Neer: [name] Other Clinician: Referring Aubreana Cornacchia: Treating Hema Lanza/Extender: Theodis Sato in Treatment: 0 Edema Assessment Assessed: Kyra Searles: No] [Right: No] Edema: [Left: Yes] [Right: Yes] Calf Left: Right: Point of Measurement: 31.5 cm From Medial Instep 63.5 cm Ankle Left: Right: Point of Measurement: 7.5 cm From Medial Instep 43.5 cm Knee To Floor Left: Right: From Medial Instep 37 cm Vascular Assessment Pulses: Dorsalis Pedis Palpable: [Left:Yes] Extremity colors, hair growth, and conditions: Extremity Color: [Left:Hyperpigmented] Hair Growth on Extremity: [Left:No] Temperature of Extremity: [Left:Cool] Capillary Refill: [Left:< 3 seconds] Dependent Rubor: [Left:No] Blanched when Elevated:  [Left:No] 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 01/09/2023 12:30 PM Medical Record Number: [id number] Patient Account Number: [id number] Date of Birth/Sex: Treating RN: May 05, 1950 (72 y.o. F) Primary Care Kaydon Creedon: [name] Other Clinician: Referring Yanky Vanderburg: Treating Shayra Anton/Extender: Theodis Sato in Treatment: 0 Vital Signs Height(in): 62 Pulse(bpm): 99 Weight(lbs): 325 Blood Pressure(mmHg): 137/75 Body Mass Index(BMI): 59.4 Temperature(F): 98.5 Respiratory Rate(breaths/min): 20 [3:Photos:] [N/A:N/A] Left, Proximal, Lateral Lower Leg Left, Distal, Lateral Lower Leg N/A Wound Location: Gradually Appeared Gradually Appeared N/A Wounding Event: Lymphedema Lymphedema N/A Primary Etiology: Anemia, Chronic Obstructive Anemia, Chronic Obstructive N/A Comorbid History: Pulmonary Disease (COPD), Sleep Pulmonary Disease (COPD), Sleep Apnea, Congestive Heart Failure, Apnea, Congestive Heart Failure, Hypertension, Peripheral Venous Hypertension, Peripheral Venous Disease Disease 06/26/2022 06/26/2022 N/A Date Acquired: 0 0 N/A Weeks of Treatment: Open Open N/A Wound Status: No No N/A Wound Recurrence: 2x1x0.1 3x5x0.1 N/A Measurements L x W x D (cm) 1.571 11.781 N/A A (cm) : rea 0.157 1.178 N/A Volume (cm) : Full Thickness Without Exposed Full Thickness Without Exposed  N/A Classification: Support Structures Support Structures Medium N/A N/A Exudate Amount: Serosanguineous N/A N/A Exudate Type: red, brown N/A N/A Exudate Color: Distinct, outline attached Distinct, outline attached N/A Wound Margin: Medium (34-66%) Medium (34-66%) N/A Granulation Amount: Pink Pink N/A Granulation Quality: Medium (34-66%) Medium (34-66%) N/A Necrotic Amount: Fat Layer (Subcutaneous Tissue): Yes Fat Layer (Subcutaneous Tissue): Yes N/A Exposed Structures: Fascia: No Tendon: No Muscle: No Joint: No Bone: No Small (1-33%) Small (1-33%) N/A Epithelialization: Excoriation: No Scarring: Yes N/A Periwound Skin Texture: Induration: No Callus: No Crepitus: No Rash: No Scarring: No Dry/Scaly: Yes Dry/Scaly: Yes N/A Periwound Skin Moisture: Maceration: No Maceration: No Hemosiderin Staining: Yes Hemosiderin Staining: Yes N/A Periwound Skin Color: Cool/Cold Cool/Cold N/A Temperature: Compression Therapy Compression Therapy N/A Procedures Performed: Treatment Notes CORTEZ, STEELMAN ([id number]) [phone].pdf Page 5 of 10 Electronic Signature(s) Signed: 01/09/2023 5:00:10 PM By: Baltazar Najjar MD Entered By: Baltazar Najjar on 01/09/2023 14:47:43 -------------------------------------------------------------------------------- Multi-Disciplinary Care Plan Details Patient Name: Date of Service: [place] RIE S. 01/09/2023 12:30 PM Medical Record Number: [id number] Patient Account Number: [id number] Date of Birth/Sex: Treating RN: Aug 19, 1950 (72 y.o. Katrinka Blazing Primary Care Joanie Duprey: [name] Other Clinician: Referring Dashel Goines: Treating Kiwana Deblasi/Extender: Theodis Sato in Treatment: 0 Active Inactive Wound/Skin Impairment Nursing Diagnoses: Impaired tissue integrity Goals: Patient/caregiver will verbalize understanding of skin care regimen Date Initiated: 01/09/2023  Target  Resolution Date: 05/09/2023 Goal Status: Active Interventions: Assess ulceration(s) every visit Treatment Activities: Skin care regimen initiated : 01/09/2023 Notes: Electronic Signature(s) Signed: 01/10/2023 12:02:50 PM By: Karie Schwalbe RN Entered By: Karie Schwalbe on 01/09/2023 14:03:10 -------------------------------------------------------------------------------- Pain Assessment Details Patient Name: Date of Service: [place] RIE S. 01/09/2023 12:30 PM Medical Record Number: [id number] Patient Account Number: [id number] Date of Birth/Sex: Treating RN: [birth year] (72 y.o. Katrinka Blazing Primary Care Calix Heinbaugh: [name] Other Clinician: Referring Honey Zakarian: Treating Hennie Gosa/Extender: Theodis Sato in Treatment: 0 Active Problems Location of Pain Severity and Description of Pain Patient Has Paino No Site Locations HONORE, WIPPERFURTH ([id number]) [id number] Page 6 of 10 Pain Management and Medication Current Pain Management: Electronic Signature(s) Signed: 01/10/2023 12:02:50 PM By: Karie Schwalbe RN Entered By: Karie Schwalbe on 01/09/2023 13:35:08 -------------------------------------------------------------------------------- Patient/Caregiver Education Details Patient Name: Date of Service: Kristopher Glee 10/31/2024andnbsp12:30 PM Medical Record Number: [id number] Patient Account Number: [id number] Date of Birth/Gender: Treating RN: [birth year] (72 y.o. Katrinka Blazing Primary Care Physician: [name] Other Clinician: Referring Physician: Treating Physician/Extender: Theodis Sato in Treatment: 0 Education Assessment Education Provided To: Patient Education Topics Provided Wound/Skin Impairment: Methods: Explain/Verbal Responses: See progress note, State content correctly Electronic Signature(s) Signed: 01/10/2023 12:02:50 PM By: Karie Schwalbe  RN Entered By: Karie Schwalbe on 01/09/2023 14:03:25 -------------------------------------------------------------------------------- Wound Assessment Details Patient Name: Date of Service: [place] RIE S. 01/09/2023 12:30 PM Medical Record Number: [id number] Patient Account Number: [id number] Date of Birth/Sex: Treating RN: [birth year] (72 y.o. Katrinka Blazing Primary Care Mithran Strike: [name] Other Clinician: Referring Len Kluver: Treating Santana Edell/Extender: Mahayla, [place], Newt Lukes ([id number]) [id number] Page 7 of 10 Weeks in Treatment: 0 Wound Status Wound Number: 3 Primary Lymphedema Etiology: Wound Location: Left, Proximal, Lateral Lower Leg Wound Open Wounding Event: Gradually Appeared Status: Date Acquired: 06/26/2022 Comorbid Anemia, Chronic Obstructive Pulmonary Disease (COPD), Sleep Weeks Of Treatment: 0 History: Apnea, Congestive Heart Failure, Hypertension, Peripheral Venous Clustered Wound: No Disease Photos Wound Measurements Length: (cm) 2 Width: (cm) 1 Depth: (cm) 0.1 Area: (cm) 1.571 Volume: (cm) 0.157 % Reduction in Area: % Reduction in Volume: Epithelialization: Small (1-33%) Tunneling: No Undermining: No Wound Description Classification: Full Thickness Without Exposed Support Structures Wound Margin: Distinct, outline attached Exudate Amount: Medium Exudate Type: Serosanguineous Exudate Color: red, brown Foul Odor After Cleansing: No Slough/Fibrino Yes Wound Bed Granulation Amount: Medium (34-66%) Exposed Structure Granulation Quality: Pink Fascia Exposed: No Necrotic Amount: Medium (34-66%) Fat Layer (Subcutaneous Tissue) Exposed: Yes Necrotic Quality: Adherent Slough Tendon Exposed: No Muscle Exposed: No Joint Exposed: No Bone Exposed: No Periwound Skin Texture Texture Color No Abnormalities Noted: No No Abnormalities Noted: No Callus: No Hemosiderin Staining:  Yes Crepitus: No Temperature / Pain Excoriation: No Temperature: Cool/Cold Induration: No Rash: No Scarring: No Moisture No Abnormalities Noted: No Dry / Scaly: Yes Maceration: No Treatment Notes Wound #3 (Lower Leg) Wound Laterality: Left, Lateral, Proximal Cleanser Soap and Water Discharge Instruction: In [facility] when leg wraps are removed, and wash wound with dial antibacterial soap and water prior to dressing change. Peri-Wound Care Triamcinolone 15 (g) Discharge Instruction: Mix with Lotion Use triamcinolone 15 (g) as directed ATHALENE, KOLLE ([id number]) [phone].pdf Page 8 of 10 Sween Lotion (Moisturizing lotion) Discharge Instruction: Mix with TCA Apply moisturizing lotion as directed, or use available Lotion Topical Primary Dressing Maxorb Extra Ag+ Alginate Dressing, 2x2 (in/in) Discharge Instruction: Apply to wound bed as instructed Secondary Dressing  Woven Gauze Sponge, Non-Sterile 4x4 in Discharge Instruction: Apply over primary dressing as directed. Secured With Transpore Surgical Tape, 2x10 (in/yd) Discharge Instruction: Secure dressing with tape as directed. Compression Wrap Kerlix Roll 4.5x3.1 (in/yd) Discharge Instruction: Apply Kerlix and Coban compression as directed. Coban Self-Adherent Wrap 4x5 (in/yd) Discharge Instruction: Apply over Kerlix as directed. Unnaboot w/Calamine, 4x10 (in/yd) Discharge Instruction: Apply Unnaboot as directed. Compression Stockings Add-Ons Electronic Signature(s) Signed: 01/10/2023 12:02:50 PM By: Karie Schwalbe RN Entered By: Karie Schwalbe on 01/09/2023 13:34:16 -------------------------------------------------------------------------------- Wound Assessment Details Patient Name: Date of Service: [place] RIE S. 01/09/2023 12:30 PM Medical Record Number: [id number] Patient Account Number: [id number] Date of Birth/Sex: Treating RN: 1950/06/24 (72 y.o. Katrinka Blazing Primary Care  Jontae Sonier: [name] Other Clinician: Referring Irfan Veal: Treating Dailynn Nancarrow/Extender: Theodis Sato in Treatment: 0 Wound Status Wound Number: 4 Primary Lymphedema Etiology: Wound Location: Left, Distal, Lateral Lower Leg Wound Open Wounding Event: Gradually Appeared Status: Date Acquired: 06/26/2022 Comorbid Anemia, Chronic Obstructive Pulmonary Disease (COPD), Sleep Weeks Of Treatment: 0 History: Apnea, Congestive Heart Failure, Hypertension, Peripheral Venous Clustered Wound: No Disease Photos Wound Measurements Length: (cm) 3 Pangle, Talar S ([id number]) Width: (cm) 5 Depth: (cm) 0.1 Area: (cm) 11.781 Volume: (cm) 1.178 % Reduction in Area: [id number] Page 9 of 10 % Reduction in Volume: Epithelialization: Small (1-33%) Tunneling: No Undermining: No Wound Description Classification: Full Thickness Without Exposed Support Wound Margin: Distinct, outline attached Structures Wound Bed Granulation Amount: Medium (34-66%) Exposed Structure Granulation Quality: Pink Fat Layer (Subcutaneous Tissue) Exposed: Yes Necrotic Amount: Medium (34-66%) Necrotic Quality: Adherent Slough Periwound Skin Texture Texture Color No Abnormalities Noted: No No Abnormalities Noted: No Scarring: Yes Hemosiderin Staining: Yes Moisture Temperature / Pain No Abnormalities Noted: No Temperature: Cool/Cold Dry / Scaly: Yes Maceration: No Treatment Notes Wound #4 (Lower Leg) Wound Laterality: Left, Lateral, Distal Cleanser Soap and Water Discharge Instruction: In [facility] when leg wraps are removed, and wash wound with dial antibacterial soap and water prior to dressing change. Peri-Wound Care Triamcinolone 15 (g) Discharge Instruction: Mix with Lotion Use triamcinolone 15 (g) as directed Sween Lotion (Moisturizing lotion) Discharge Instruction: Mix with TCA Apply moisturizing lotion as directed, or use available  Lotion Topical Primary Dressing Maxorb Extra Ag+ Alginate Dressing, 2x2 (in/in) Discharge Instruction: Apply to wound bed as instructed Secondary Dressing Woven Gauze Sponge, Non-Sterile 4x4 in Discharge Instruction: Apply over primary dressing as directed. Secured With Transpore Surgical Tape, 2x10 (in/yd) Discharge Instruction: Secure dressing with tape as directed. Compression Wrap Kerlix Roll 4.5x3.1 (in/yd) Discharge Instruction: Apply Kerlix and Coban compression as directed. Coban Self-Adherent Wrap 4x5 (in/yd) Discharge Instruction: Apply over Kerlix as directed. Unnaboot w/Calamine, 4x10 (in/yd) Discharge Instruction: Apply Unnaboot as directed. Compression Stockings Add-Ons Electronic Signature(s) Signed: 01/10/2023 12:02:50 PM By: Karie Schwalbe RN Entered By: Karie Schwalbe on 01/09/2023 13:34:51 Morgan Odonnell ([id number]) [id number] Page 10 of 10 -------------------------------------------------------------------------------- Vitals Details Patient Name: Date of Service: [place] RIE S. 01/09/2023 12:30 PM Medical Record Number: [id number] Patient Account Number: [id number] Date of Birth/Sex: Treating RN: 05/24/1950 (72 y.o. Katrinka Blazing Primary Care Aryam Zhan: [name] Other Clinician: Referring Henleigh Robello: Treating Jovan Colligan/Extender: Theodis Sato in Treatment: 0 Vital Signs Time Taken: 13:07 Temperature (F): 98.5 Height (in): 62 Pulse (bpm): 99 Weight (lbs): 325 Respiratory Rate (breaths/min): 20 Body Mass Index (BMI): 59.4 Blood Pressure (mmHg): 137/75 Reference Range: 80 - 120 mg / dl Electronic Signature(s) Signed: 01/10/2023 12:02:50 PM By: Karie Schwalbe RN Entered By: Baruch Merl,  Randa Evens on 01/09/2023 13:11:41

## 2023-01-10 NOTE — Progress Notes (Signed)
JADIS, PITTER ([id number]) [phone] [id number] Page 1 of 4 Visit Report for 01/09/2023 Abuse Risk Screen Details Patient Name: Date of Service: [place]. 01/09/2023 12:30 PM Medical Record Number: [id number] Patient Account Number: [id number] Date of Birth/Sex: Treating RN: 1950-05-22 (72 y.o. Katrinka Blazing Primary Care Warnie Belair: [name] Other Clinician: Referring Keylen Eckenrode: Treating Rosalin Buster/Extender: Theodis Sato in Treatment: 0 Abuse Risk Screen Items Answer ABUSE RISK SCREEN: Has anyone close to you tried to hurt or harm you recentlyo No Do you feel uncomfortable with anyone in your familyo No Has anyone forced you do things that you didnt want to doo No Electronic Signature(Odonnell) Signed: 01/10/2023 12:02:50 PM By: Karie Schwalbe RN Entered By: Karie Schwalbe on 01/09/2023 13:13:54 -------------------------------------------------------------------------------- Activities of Daily Living Details Patient Name: Date of Service: [place] [facility]. 01/09/2023 12:30 PM Medical Record Number: [id number] Patient Account Number: [id number] Date of Birth/Sex: Treating RN: 1950-08-15 (72 y.o. Katrinka Blazing Primary Care Kaydan Wilhoite: [name] Other Clinician: Referring Meshelle Holness: Treating Karn Derk/Extender: Theodis Sato in Treatment: 0 Activities of Daily Living Items Answer Activities of Daily Living (Please select one for each item) Drive Automobile Not Able T Medications ake Completely Able Use T elephone Completely Able Care for Appearance Need Assistance Use T oilet Completely Able Bath / Shower Need Assistance Dress Self Completely Able Feed Self Completely Able Walk Not Able Get In / Out Bed Need Assistance Housework Not Able Prepare Meals Not Able Handle Money Need Assistance Shop for Self Not Able Electronic Signature(Odonnell) Signed: 01/10/2023 12:02:50  PM By: Karie Schwalbe RN Entered By: Karie Schwalbe on 01/09/2023 13:15:17 Morgan Odonnell ([id number]) [phone] [id number] Page 2 of 4 -------------------------------------------------------------------------------- Education Screening Details Patient Name: Date of Service: [place] [facility]. 01/09/2023 12:30 PM Medical Record Number: [id number] Patient Account Number: [id number] Date of Birth/Sex: Treating RN: Jan 09, 1951 (72 y.o. Katrinka Blazing Primary Care Anaiza Behrens: [name] Other Clinician: Referring Takeyla Million: Treating Camber Ninh/Extender: Theodis Sato in Treatment: 0 Primary Learner Assessed: Patient Learning Preferences/Education Level/Primary Language Learning Preference: Explanation, Demonstration, Printed Material Highest Education Level: High School Preferred Language: English Cognitive Barrier Language Barrier: No Translator Needed: No Memory Deficit: No Emotional Barrier: No Cultural/Religious Beliefs Affecting Medical Care: No Physical Barrier Impaired Vision: No Impaired Hearing: No Decreased Hand dexterity: No Knowledge/Comprehension Knowledge Level: High Comprehension Level: High Ability to understand written instructions: High Ability to understand verbal instructions: High Motivation Anxiety Level: Calm Cooperation: Cooperative Education Importance: Acknowledges Need Interest in Health Problems: Asks Questions Perception: Coherent Willingness to Engage in Self-Management High Activities: Readiness to Engage in Self-Management High Activities: Electronic Signature(Odonnell) Signed: 01/10/2023 12:02:50 PM By: Karie Schwalbe RN Entered By: Karie Schwalbe on 01/09/2023 13:15:59 -------------------------------------------------------------------------------- Fall Risk Assessment Details Patient Name: Date of Service: [place] [facility]. 01/09/2023 12:30 PM Medical Record Number:  [id number] Patient Account Number: [id number] Date of Birth/Sex: Treating RN: 1951/03/06 (72 y.o. Arta Silence Primary Care Lamona Eimer: [name] Other Clinician: Referring Ladarryl Wrage: Treating Anjalee Cope/Extender: Theodis Sato in Treatment: 0 Fall Risk Assessment Items Have you had 2 or more falls in the last 12 monthso 0 No Morgan Odonnell, Morgan Odonnell ([id number]) [phone] [id number] Page 3 of 4 Have you had any fall that resulted in injury in the last 12 monthso 0 No FALLS RISK SCREEN History of falling - immediate or within 3 months 0 No Secondary diagnosis (Do you have 2 or more medical diagnoseso)  0 No Ambulatory aid None/bed rest/wheelchair/nurse 0 Yes Crutches/cane/walker 0 No Furniture 0 No Intravenous therapy Access/Saline/Heparin Lock 0 No Gait/Transferring Normal/ bed rest/ wheelchair 0 Yes Weak (short steps with or without shuffle, stooped but able to lift head while walking, may seek 0 No support from furniture) Impaired (short steps with shuffle, may have difficulty arising from chair, head down, impaired 0 No balance) Mental Status Oriented to own ability 0 Yes Electronic Signature(Odonnell) Signed: 01/09/2023 6:03:32 PM By: [name] Stall RN, BSN Signed: 01/10/2023 12:02:50 PM By: Karie Schwalbe RN Entered By: Karie Schwalbe on 01/09/2023 13:16:07 -------------------------------------------------------------------------------- Foot Assessment Details Patient Name: Date of Service: [place]. 01/09/2023 12:30 PM Medical Record Number: [id number] Patient Account Number: [id number] Date of Birth/Sex: Treating RN: Nov 09, 1950 (72 y.o. Katrinka Blazing Primary Care Yaxiel Minnie: [name] Other Clinician: Referring Kacy Hegna: Treating Briley Bumgarner/Extender: Theodis Sato in Treatment: 0 Foot Assessment Items Site Locations + = Sensation present, - = Sensation absent, C = Callus, U =  Ulcer R = Redness, W = Warmth, M = Maceration, PU = Pre-ulcerative lesion F = Fissure, O[place] = Swelling, D = Dryness Assessment Right: Left: Other Deformity: No No Prior Foot Ulcer: No No Prior Amputation: No No Charcot Joint: No No Ambulatory Status: Ambulatory With Help Assistance Device: Wheelchair Morgan Odonnell, Morgan Odonnell ([id number]) [phone] [id number] Page 4 of 4 Gait: Steady Electronic Signature(Odonnell) Signed: 01/10/2023 12:02:50 PM By: Karie Schwalbe RN Entered By: Karie Schwalbe on 01/09/2023 13:20:14 -------------------------------------------------------------------------------- Nutrition Risk Screening Details Patient Name: Date of Service: [place] [facility]. 01/09/2023 12:30 PM Medical Record Number: [id number] Patient Account Number: [id number] Date of Birth/Sex: Treating RN: [birth year] (72 y.o. Katrinka Blazing Primary Care Trever Streater: [name] Other Clinician: Referring Camile Esters: Treating Earsie Humm/Extender: Theodis Sato in Treatment: 0 Height (in): 62 Weight (lbs): 325 Body Mass Index (BMI): 59.4 Nutrition Risk Screening Items Score Screening NUTRITION RISK SCREEN: I have an illness or condition that made me change the kind and/or amount of food I eat 0 No I eat fewer than two meals per day 0 No I eat few fruits and vegetables, or milk products 0 No I have three or more drinks of beer, liquor or wine almost every day 0 No I have tooth or mouth problems that make it hard for me to eat 0 No I don't always have enough money to buy the food I need 0 No I eat alone most of the time 0 No I take three or more different prescribed or over-the-counter drugs a day 1 Yes Without wanting to, I have lost or gained 10 pounds in the last six months 0 No I am not always physically able to shop, cook and/or feed myself 0 No Nutrition Protocols Good Risk Protocol 0 No interventions needed Moderate Risk Protocol High Risk  Proctocol Risk Level: Good Risk Score: 1 Electronic Signature(Odonnell) Signed: 01/10/2023 12:02:50 PM By: Karie Schwalbe RN Entered By: Karie Schwalbe on 01/09/2023 13:16:29

## 2023-01-10 NOTE — Progress Notes (Signed)
Morgan Odonnell, Morgan Odonnell ([id number]) [id number] Page 1 of 8 Visit Report for 01/09/2023 Chief Complaint Document Details Patient Name: Date of Service: Morgan Odonnell 01/09/2023 12:30 PM Medical Record Number: [id number] Patient Account Number: [id number] Date of Birth/Sex: Treating RN: 01-15-1951 (72 y.o. F) Primary Care Provider: [name] Other Clinician: Referring Provider: Treating Provider/Extender: Theodis Sato in Treatment: 0 Information Obtained from: Patient Chief Complaint 01/09/2023; patient is here for review of wounds on the left lateral leg x 2 Electronic Signature(s) Signed: 01/09/2023 5:00:10 PM By: Baltazar Najjar MD Entered By: Baltazar Najjar on 01/09/2023 14:48:05 -------------------------------------------------------------------------------- HPI Details Patient Name: Date of Service: [place] RIE S. 01/09/2023 12:30 PM Medical Record Number: [id number] Patient Account Number: [id number] Date of Birth/Sex: Treating RN: 26-Mar-1950 (72 y.o. F) Primary Care Provider: [name] Other Clinician: Referring Provider: Treating Provider/Extender: Theodis Sato in Treatment: 0 History of Present Illness HPI Description: ADMISSION 01/09/2023. This is a 72 year old-year-woman who arrives in clinic accompanied by her husband for review of 2 wounds on her left lateral leg. This is in the setting of severe lymphedema. In discussion with this patient she has had this since she was a teenager. She appears to have a significant family history of lymphedema involving her mother and some of her siblings as well as her children so this may be the hereditary variant of this. In any case she hit her left lateral leg on a cinderblock about 2 months ago. She had 2 open wounds from this apparently with some depth initially. Her husband has been attending to these using a combination  of Betadine and hydrogen peroxide. He has made some progress but the wounds have stalled. She is not wearing any form of compression although I think they have bilateral lymphedema pumps in process ordered by her primary physiciano She does not have any compression stockings Past medical history includes morbid obesity, hypothyroidism, chronic respiratory failure, COPD on continuous oxygen at 4 L, hyper tension and chronic diastolic heart failure. ABI in our clinic was 0.77 on the left Electronic Signature(s) Signed: 01/09/2023 5:00:10 PM By: Baltazar Najjar MD Entered By: Baltazar Najjar on 01/09/2023 14:51:59 Morgan Odonnell ([id number]) [id number] Page 2 of 8 -------------------------------------------------------------------------------- Physical Exam Details Patient Name: Date of Service: [place] RIE S. 01/09/2023 12:30 PM Medical Record Number: [id number] Patient Account Number: [id number] Date of Birth/Sex: Treating RN: 15-Mar-1950 (72 y.o. F) Primary Care Provider: [name] Other Clinician: Referring Provider: Treating Provider/Extender: Theodis Sato in Treatment: 0 Constitutional Sitting or standing Blood Pressure is within target range for patient.. Pulse regular and within target range for patient.Marland Kitchen Respirations regular, non-labored and within target range.. Temperature is normal and within the target range for the patient.. Work of breathing is mildly increased. Respiratory Above normal respiratory effort noted. Respiratory rate elveaated. O2 at 4 L. Cardiovascular Pedal pulses are palpable at the dorsalis pedis on the left I cannot feel her posterior tibial.. Stage III lymphedema both sides. Severe fissured flaking dry skin on her legs bilaterally erythema but no tenderness. Compatible with stasis dermatitis. Notes Wound exam; she has 2 wounds 1 on the left lateral mid calf and 1 superiorly just below  the fibular head. Both of these are dry but not particularly ominous looking wounds. No evidence of surrounding infection. There is dry flaking skin around the edges of both of these but I did not debride these today Electronic Signature(s) Signed: 01/09/2023 5:00:10 PM  By: Baltazar Najjar MD Entered By: Baltazar Najjar on 01/09/2023 14:55:37 -------------------------------------------------------------------------------- Physician Orders Details Patient Name: Date of Service: [place] RIE S. 01/09/2023 12:30 PM Medical Record Number: [id number] Patient Account Number: [id number] Date of Birth/Sex: Treating RN: Jun 22, 1950 (72 y.o. Katrinka Blazing Primary Care Provider: [name] Other Clinician: Referring Provider: Treating Provider/Extender: Theodis Sato in Treatment: 0 Verbal / Phone Orders: No Diagnosis Coding Follow-up Appointments ppointment in 1 week. - Dr. Leanord Hawking Room 7 Return A Other: - Pick up prescribed cream/ointment at pharmacy Anesthetic (In clinic) Topical Lidocaine 4% applied to wound bed Bathing/ Shower/ Hygiene Other Bathing/Shower/Hygiene Orders/Instructions: - Sponge Bathe or when bathing do not get leg wraps wet. Please consider wearing a cast Protector on the left leg-do not get dressing/compression wraps wet, keep leg wraps dry until the next appointment. Edema Control - Orders / Instructions Elevate legs to the level of the heart or above for 30 minutes daily and/or when sitting for 3-4 times a day throughout the day. - as Tolerated Avoid standing for long periods of time. Lymphedema Treatment Plan - Exercise, Compression and Elevation Elevate legs 30 - 60 minutes at or above heart level at least 3 - 4 times daily as able/tolerated Avoid standing for long periods and elevate leg(s) parallel to the floor when sitting Use Pneumatic Compression Device on leg(s) 2-3 times a day for 45-60 minutes - i.e. Lymphadema  Pumps Wound Treatment Wound #3 - Lower Leg Wound Laterality: Left, Lateral, Proximal Morgan Odonnell, Morgan Odonnell ([id number]) [id number] Page 3 of 8 Cleanser: Soap and Water 1 x Per Week/30 Days Discharge Instructions: In [facility] when leg wraps are removed, and wash wound with dial antibacterial soap and water prior to dressing change. Peri-Wound Care: Triamcinolone 15 (g) 1 x Per Week/30 Days Discharge Instructions: Mix with Lotion Use triamcinolone 15 (g) as directed Peri-Wound Care: Sween Lotion (Moisturizing lotion) 1 x Per Week/30 Days Discharge Instructions: Mix with TCA Apply moisturizing lotion as directed, or use available Lotion Prim Dressing: Maxorb Extra Ag+ Alginate Dressing, 2x2 (in/in) 1 x Per Week/30 Days ary Discharge Instructions: Apply to wound bed as instructed Secondary Dressing: Woven Gauze Sponge, Non-Sterile 4x4 in 1 x Per Week/30 Days Discharge Instructions: Apply over primary dressing as directed. Secured With: Transpore Surgical Tape, 2x10 (in/yd) 1 x Per Week/30 Days Discharge Instructions: Secure dressing with tape as directed. Compression Wrap: Kerlix Roll 4.5x3.1 (in/yd) 1 x Per Week/30 Days Discharge Instructions: Apply Kerlix and Coban compression as directed. Compression Wrap: Coban Self-Adherent Wrap 4x5 (in/yd) 1 x Per Week/30 Days Discharge Instructions: Apply over Kerlix as directed. Compression Wrap: Unnaboot w/Calamine, 4x10 (in/yd) 1 x Per Week/30 Days Discharge Instructions: Apply Unnaboot as directed. Wound #4 - Lower Leg Wound Laterality: Left, Lateral, Distal Cleanser: Soap and Water 1 x Per Week/30 Days Discharge Instructions: In [facility] when leg wraps are removed, and wash wound with dial antibacterial soap and water prior to dressing change. Peri-Wound Care: Triamcinolone 15 (g) 1 x Per Week/30 Days Discharge Instructions: Mix with Lotion Use triamcinolone 15 (g) as directed Peri-Wound Care: Sween Lotion (Moisturizing lotion) 1 x  Per Week/30 Days Discharge Instructions: Mix with TCA Apply moisturizing lotion as directed, or use available Lotion Prim Dressing: Maxorb Extra Ag+ Alginate Dressing, 2x2 (in/in) 1 x Per Week/30 Days ary Discharge Instructions: Apply to wound bed as instructed Secondary Dressing: Woven Gauze Sponge, Non-Sterile 4x4 in 1 x Per Week/30 Days Discharge Instructions: Apply over primary dressing as directed. Secured With: [employer]  Surgical Tape, 2x10 (in/yd) 1 x Per Week/30 Days Discharge Instructions: Secure dressing with tape as directed. Compression Wrap: Kerlix Roll 4.5x3.1 (in/yd) 1 x Per Week/30 Days Discharge Instructions: Apply Kerlix and Coban compression as directed. Compression Wrap: Coban Self-Adherent Wrap 4x5 (in/yd) 1 x Per Week/30 Days Discharge Instructions: Apply over Kerlix as directed. Compression Wrap: Unnaboot w/Calamine, 4x10 (in/yd) 1 x Per Week/30 Days Discharge Instructions: Apply Unnaboot as directed. Patient Medications llergies: No Known Allergies A Notifications Medication Indication Start End 01/09/2023 triamcinolone acetonide DOSE topical 0.1 % cream - cream topical once daily with dressing changes 1:4 in cetaphil cream Electronic Signature(s) Signed: 01/09/2023 3:00:57 PM By: Baltazar Najjar MD Entered By: Baltazar Najjar on 01/09/2023 15:00:57 Morgan Odonnell ([id number]) [id number] Page 4 of 8 -------------------------------------------------------------------------------- Problem List Details Patient Name: Date of Service: [place] RIE S. 01/09/2023 12:30 PM Medical Record Number: [id number] Patient Account Number: [id number] Date of Birth/Sex: Treating RN: [birth year] (72 y.o. 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 01/09/2023 12:30 PM Medical Record Number: [id number] Patient Account Number: [id number] Date of Birth/Sex: Treating RN: 1950-07-01 (72 y.o. F) Primary Care Provider: [name] Other Clinician: Referring Provider: Treating Provider/Extender: Theodis Sato in Treatment: 0 Subjective Chief Complaint Information obtained from Patient 01/09/2023; patient is here for review of wounds on the left lateral leg x 2 History of Present Illness (HPI) ADMISSION 01/09/2023. This is a 72 year old-year-woman who arrives in clinic accompanied by her husband for review of 2 wounds on her left lateral leg. This is in the setting of severe lymphedema. In discussion with this patient she has had this since she was a teenager. She appears to have a significant family history of lymphedema involving her mother and some of her siblings as well as her children so this may be the hereditary variant of this. In any case she hit her left lateral leg on a cinderblock about 2 months ago. She had 2 open wounds from this apparently with some depth initially. Her husband has been attending to these using a combination of Betadine and hydrogen peroxide. He has made some progress but the wounds have stalled. She is not wearing any form of compression although I think they have bilateral lymphedema pumps in process ordered by her primary physiciano She does not have any compression stockings Past  medical history includes morbid obesity, hypothyroidism, chronic respiratory failure, COPD on continuous oxygen at 4 L, hyper tension and chronic diastolic heart failure. Morgan Odonnell, Morgan Odonnell ([id number]) [id number] Page 5 of 8 ABI in our clinic was 0.77 on the left Patient History Information obtained from Chart. Allergies No Known Allergies Family History Unknown History, Heart Disease - Mother,Father. Social History Former smoker - ended on 01/08/2018, Marital Status - Married, Alcohol Use - Rarely, Drug Use - No History, Caffeine Use - Never. Medical History Hematologic/Lymphatic Patient has history of Anemia - iron deficient Respiratory Patient has history of Chronic Obstructive Pulmonary Disease (COPD), Sleep Apnea - does not tolerate CPAP or BIPAP Cardiovascular  Patient has history of Congestive Heart Failure, Hypertension, Peripheral Venous Disease Hospitalization/Surgery History - [facility]. 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 Pulse regular and within target range for patient.Marland Kitchen Respirations regular, non-labored and within target range.. Temperature is normal and within the target range for the patient.. Work of breathing is mildly increased. Vitals Time Taken: 1:07 PM, Height: 62 in, Weight: 325 lbs, BMI: 59.4, Temperature: 98.5 F, Pulse: 99 bpm, Respiratory Rate: 20 breaths/min, Blood Pressure: 137/75 mmHg. Respiratory Above normal respiratory effort noted. Respiratory rate elveaated. O2 at 4 L. Cardiovascular Pedal pulses are palpable at the dorsalis pedis on the left I cannot feel her posterior tibial.. Stage III  lymphedema both sides. Severe fissured flaking dry skin on her legs bilaterally erythema but no tenderness. Compatible with stasis dermatitis. General Notes: Wound exam; she has 2 wounds 1 on the left lateral mid calf and 1 superiorly just below the fibular head. Both of these are dry but not particularly ominous looking wounds. No evidence of surrounding infection. There is dry flaking skin around the edges of both of these but I did not debride these today Integumentary (Hair, Skin) Wound #3 status is Open. Original cause of wound was Gradually Appeared. The date acquired was: 06/26/2022. The wound is located on the Left,Proximal,Lateral Lower Leg. The wound measures 2cm length x 1cm width x 0.1cm depth; 1.571cm^2 area and 0.157cm^3 volume. There is Fat Layer (Subcutaneous Tissue) exposed. There is no tunneling or undermining noted. There is a medium amount of serosanguineous drainage noted. The wound margin is distinct with the outline attached to the wound base. There is medium (34-66%) pink granulation within the wound bed. There is a medium (34-66%) amount of necrotic tissue within the wound bed including Adherent Slough. The periwound skin appearance exhibited: Dry/Scaly, Hemosiderin Staining. The periwound skin appearance did not exhibit: Callus, Crepitus, Excoriation, Induration, Rash, Scarring, Maceration. Periwound temperature was noted as Cool/Cold. Wound #4 status is Open. Original cause of wound was Gradually Appeared. The date acquired was: 06/26/2022. The wound is located on the Left,Distal,Lateral Lower Leg. The wound measures 3cm length x 5cm width x 0.1cm depth; 11.781cm^2 area and 1.178cm^3 volume. There is Fat Layer (Subcutaneous Tissue) exposed. There is no tunneling or undermining noted. The wound margin is distinct with the outline attached to the wound base. There is medium (34-66%) pink granulation within the wound bed. There is a medium (34-66%) amount of necrotic tissue  within the wound bed including Adherent Slough. The periwound skin appearance exhibited: Scarring, Dry/Scaly, Hemosiderin Staining. The periwound skin appearance did not exhibit: Maceration. Periwound temperature was noted as Cool/Cold. 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 There was a Radio broadcast assistant Compression Therapy Procedure by Karie Schwalbe, RN. Post procedure Diagnosis Wound #3: Same as Pre-Procedure Wound #4 Pre-procedure diagnosis of Wound #4 is a Lymphedema located on the Left,Distal,Lateral Lower Leg . There was a Radio broadcast assistant Compression Therapy Procedure by Karie Schwalbe, RN. Post procedure Diagnosis Wound #4: Same as Pre-Procedure Plan Follow-up Appointments: Return Appointment in 1 week. - Dr.  Mita Vallo Room 7 Other: - Pick up prescribed cream/ointment at pharmacy Anesthetic: (In clinic) Topical Lidocaine 4% applied to wound bed Bathing/ Shower/ Hygiene: Other Bathing/Shower/Hygiene Orders/Instructions: - Sponge Bathe or when bathing do not get leg wraps wet. Please consider wearing a cast Protector on the left leg-do not get dressing/compression wraps wet, keep leg wraps dry until the next appointment. Edema Control - Orders / Instructions: Elevate legs to the level of the heart or above for 30 minutes daily and/or when sitting for 3-4 times a day throughout the day. - as Tolerated Avoid standing for long periods of time. Lymphedema Treatment Plan - Exercise, Compression and Elevation: Elevate legs 30 - 60 minutes at or above heart level at least 3 - 4 times daily as able/tolerated Avoid standing for long periods and elevate leg(s) parallel to the floor when  sitting Use Pneumatic Compression Device on leg(s) 2-3 times a day for 45-60 minutes - i.e. Lymphadema Pumps WOUND #3: - Lower Leg Wound Laterality: Left, Lateral, Proximal Cleanser: Soap and Water 1 x Per Week/30 Days Discharge Instructions: In [facility] when leg wraps are removed, and wash wound with dial antibacterial soap and water prior to dressing change. Peri-Wound Care: Triamcinolone 15 (g) 1 x Per Week/30 Days Discharge Instructions: Mix with Lotion Use triamcinolone 15 (g) as directed Peri-Wound Care: Sween Lotion (Moisturizing lotion) 1 x Per Week/30 Days Discharge Instructions: Mix with TCA Apply moisturizing lotion as directed, or use available Lotion Prim Dressing: Maxorb Extra Ag+ Alginate Dressing, 2x2 (in/in) 1 x Per Week/30 Days ary Discharge Instructions: Apply to wound bed as instructed Secondary Dressing: Woven Gauze Sponge, Non-Sterile 4x4 in 1 x Per Week/30 Days Discharge Instructions: Apply over primary dressing as directed. Secured With: Transpore Surgical T ape, 2x10 (in/yd) 1 x Per Week/30 Days Discharge Instructions: Secure dressing with tape as directed. Com pression Wrap: Kerlix Roll 4.5x3.1 (in/yd) 1 x Per Week/30 Days Discharge Instructions: Apply Kerlix and Coban compression as directed. Com pression Wrap: Coban Self-Adherent Wrap 4x5 (in/yd) 1 x Per Week/30 Days Discharge Instructions: Apply over Kerlix as directed. Com pression Wrap: Unnaboot w/Calamine, 4x10 (in/yd) 1 x Per Week/30 Days Discharge Instructions: Apply Unnaboot as directed. WOUND #4: - Lower Leg Wound Laterality: Left, Lateral, Distal Cleanser: Soap and Water 1 x Per Week/30 Days Discharge Instructions: In [facility] when leg wraps are removed, and wash wound with dial antibacterial soap and water prior to dressing change. Peri-Wound Care: Triamcinolone 15 (g) 1 x Per Week/30 Days Discharge Instructions: Mix with Lotion Use triamcinolone 15 (g) as directed Peri-Wound Care: Sween Lotion (Moisturizing  lotion) 1 x Per Week/30 Days Discharge Instructions: Mix with TCA Apply moisturizing lotion as directed, or use available Lotion Prim Dressing: Maxorb Extra Ag+ Alginate Dressing, 2x2 (in/in) 1 x Per Week/30 Days ary Discharge Instructions: Apply to wound bed as instructed Secondary Dressing: Woven Gauze Sponge, Non-Sterile 4x4 in 1 x Per Week/30 Days Discharge Instructions: Apply over primary dressing as directed. Secured With: Transpore Surgical T ape, 2x10 (in/yd) 1 x Per Week/30 Days Discharge Instructions: Secure dressing with tape as directed. Com pression Wrap: Kerlix Roll 4.5x3.1 (in/yd) 1 x Per Week/30 Days Discharge Instructions: Apply Kerlix and Coban compression as directed. Com pression Wrap: Coban Self-Adherent Wrap 4x5 (in/yd) 1 x Per Week/30 Days Discharge Instructions: Apply over Kerlix as directed. Com pression Wrap: Unnaboot w/Calamine, 4x10 (in/yd) 1 x Per Week/30 Days Discharge Instructions: Apply Unnaboot as directed. 1. We used silver cell,Zetuvit, ABDs under an Radio broadcast assistant. 2. We advised the  patient to keep her leg elevated. She sleeps in a recliner at night. Morgan Odonnell, Morgan Odonnell ([id number]) [id number] Page 7 of 8 3. Liberally applied moisturizer and TCA to skin on the left leg. I will write her a prescription to use at night for the right leg 4. I did not see any evidence of systemic fluid overload at the bedside. 5. No evidence of cellulitis although she would certainly be at risk for this. Electronic Signature(s) Signed: 01/09/2023 5:00:10 PM By: Baltazar Najjar MD Entered By: Baltazar Najjar on 01/09/2023 14:57:06 -------------------------------------------------------------------------------- HxROS Details Patient Name: Date of Service: [place] RIE S. 01/09/2023 12:30 PM Medical Record Number: [id number] Patient Account Number: [id number] Date of Birth/Sex: Treating RN: August 15, 1950 (72 y.o. 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 4 L [place] Oxygen Cardiovascular Medical History: Positive for: Congestive Heart Failure; Hypertension; Peripheral Venous Disease Past Medical History Notes: BLE edema Integumentary (Skin) Medical History: Past Medical History Notes: skin abscesses Musculoskeletal Medical History: Past Medical History Notes: chronic back pain Immunizations Pneumococcal Vaccine: Received Pneumococcal Vaccination: No Implantable Devices None Hospitalization / Surgery History Type of Hospitalization/Surgery Morgan Odonnell, Morgan Odonnell ([id number]) [id number] Page 8 of 8 [facility] Family and Social History Unknown History: Yes; Heart Disease: Yes - Mother,Father; Former smoker - ended on 01/08/2018; Marital Status - Married; Alcohol Use: Rarely; Drug Use: No History; Caffeine Use: Never; Financial Concerns: No; Food, Clothing or Shelter Needs: No; Support System Lacking: No; Transportation Concerns: No Electronic Signature(s) Signed: 01/09/2023 5:00:10 PM By: Baltazar Najjar MD Signed: 01/09/2023 6:03:32 PM By: [name] Stall RN, BSN Signed: 01/10/2023 12:02:50 PM By: Karie Schwalbe RN Entered By: Karie Schwalbe on 01/09/2023 13:13:45 -------------------------------------------------------------------------------- SuperBill Details Patient Name: Date of Service: [place] RIE S. 01/09/2023 Medical Record  Number: [id number] Patient Account Number: [id number] Date of Birth/Sex: Treating RN: Jan 05, 1951 (72 y.o. 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

## 2023-01-20 ENCOUNTER — Ambulatory Visit (HOSPITAL_BASED_OUTPATIENT_CLINIC_OR_DEPARTMENT_OTHER): Payer: 59 | Admitting: Internal Medicine

## 2023-01-23 ENCOUNTER — Encounter (HOSPITAL_BASED_OUTPATIENT_CLINIC_OR_DEPARTMENT_OTHER): Payer: 59 | Admitting: Internal Medicine

## 2023-01-27 ENCOUNTER — Encounter (HOSPITAL_BASED_OUTPATIENT_CLINIC_OR_DEPARTMENT_OTHER): Payer: 59 | Admitting: Internal Medicine

## 2023-02-17 ENCOUNTER — Ambulatory Visit (HOSPITAL_BASED_OUTPATIENT_CLINIC_OR_DEPARTMENT_OTHER): Payer: 59 | Admitting: Internal Medicine

## 2023-03-17 ENCOUNTER — Encounter (HOSPITAL_BASED_OUTPATIENT_CLINIC_OR_DEPARTMENT_OTHER): Payer: 59 | Admitting: Internal Medicine

## 2023-04-02 ENCOUNTER — Encounter: Payer: Self-pay | Admitting: Pulmonary Disease

## 2023-04-02 ENCOUNTER — Ambulatory Visit: Payer: Medicaid Other | Admitting: Pulmonary Disease

## 2023-04-29 ENCOUNTER — Ambulatory Visit (HOSPITAL_BASED_OUTPATIENT_CLINIC_OR_DEPARTMENT_OTHER): Payer: 59 | Admitting: Internal Medicine

## 2023-05-22 ENCOUNTER — Emergency Department (HOSPITAL_COMMUNITY)

## 2023-05-22 ENCOUNTER — Other Ambulatory Visit: Payer: Self-pay

## 2023-05-22 ENCOUNTER — Emergency Department (HOSPITAL_COMMUNITY)
Admission: EM | Admit: 2023-05-22 | Discharge: 2023-05-23 | Disposition: A | Discharge status: Home / Self Care | Attending: Emergency Medicine | Admitting: Emergency Medicine

## 2023-05-22 ENCOUNTER — Encounter (HOSPITAL_COMMUNITY): Payer: Self-pay

## 2023-05-22 DIAGNOSIS — S92425A Nondisplaced fracture of distal phalanx of left great toe, initial encounter for closed fracture: Secondary | ICD-10-CM | POA: Diagnosis not present

## 2023-05-22 DIAGNOSIS — W19XXXA Unspecified fall, initial encounter: Secondary | ICD-10-CM

## 2023-05-22 DIAGNOSIS — S81812A Laceration without foreign body, left lower leg, initial encounter: Secondary | ICD-10-CM

## 2023-05-22 DIAGNOSIS — Z79899 Other long term (current) drug therapy: Secondary | ICD-10-CM | POA: Diagnosis not present

## 2023-05-22 DIAGNOSIS — S82832A Other fracture of upper and lower end of left fibula, initial encounter for closed fracture: Secondary | ICD-10-CM | POA: Insufficient documentation

## 2023-05-22 DIAGNOSIS — Z7982 Long term (current) use of aspirin: Secondary | ICD-10-CM | POA: Insufficient documentation

## 2023-05-22 DIAGNOSIS — Y92009 Unspecified place in unspecified non-institutional (private) residence as the place of occurrence of the external cause: Secondary | ICD-10-CM | POA: Insufficient documentation

## 2023-05-22 DIAGNOSIS — S92515D Nondisplaced fracture of proximal phalanx of left lesser toe(s), subsequent encounter for fracture with routine healing: Secondary | ICD-10-CM | POA: Insufficient documentation

## 2023-05-22 DIAGNOSIS — Z23 Encounter for immunization: Secondary | ICD-10-CM | POA: Diagnosis not present

## 2023-05-22 DIAGNOSIS — W07XXXA Fall from chair, initial encounter: Secondary | ICD-10-CM | POA: Insufficient documentation

## 2023-05-22 MED ORDER — TETANUS-DIPHTH-ACELL PERTUSSIS 5-2.5-18.5 LF-MCG/0.5 IM SUSY
0.5000 mL | PREFILLED_SYRINGE | Freq: Once | INTRAMUSCULAR | Status: AC
  Administered 2023-05-22: 0.5 mL via INTRAMUSCULAR
  Filled 2023-05-22: qty 0.5

## 2023-05-22 NOTE — Discharge Instructions (Addendum)
 You were seen in the emergency department for an ankle and foot injury after a fall.  You had a skin tear across your feet and some of your toenails were bloodied.  The x-ray showed a possible fracture of your outside ankle bone and 2 of your toes.  We are placing you in an immobilizer and a dressing.  You can use soap and water to the area.  Keep elevated.  Use the walker and weight-bear as tolerated.  Call orthopedics for follow-up.  Tylenol as needed for pain.

## 2023-05-22 NOTE — ED Provider Notes (Incomplete)
 [place] EMERGENCY DEPARTMENT AT [facility] Provider Note   CSN: [id number] Arrival date & time: 05/22/23  2112     History {Add pertinent medical, surgical, social history, OB history to HPI:1} Chief Complaint  Patient presents with  . Fall    Morgan Odonnell is a 73 y.o. female.  She is brought in by EMS after a fall at home.  She was transferring from chair to a different chair when her left knee gave out on her and she fell to the floor scraping her left foot.  She has some skin tear to her left foot and has left ankle pain.  Denies hitting her head or neck no loss of consciousness.  She is not on any blood thinners.  She has significant lymphedema of her lower extremities and is on chronic oxygen.  She said she falls fairly frequently.  Lives at home with her husband and her kids  The history is provided by the patient and the EMS personnel.  Fall This is a new problem. The current episode started 1 to 2 hours ago. The problem has not changed since onset.Pertinent negatives include no chest pain, no abdominal pain, no headaches and no shortness of breath. Associated symptoms comments: Left foot and ankle pain. Nothing aggravates the symptoms. Nothing relieves the symptoms. She has tried nothing for the symptoms. The treatment provided no relief.       Home Medications Prior to Admission medications   Medication Sig Start Date End Date Taking? Authorizing Provider  albuterol (PROVENTIL HFA;VENTOLIN HFA) 108 (90 BASE) MCG/ACT inhaler Inhale 2 puffs into the lungs every 6 (six) hours as needed for wheezing.     [provider]  aspirin EC 81 MG EC tablet Take 1 tablet (81 mg total) by mouth daily. 07/05/17   Noralee Stain, DO  baclofen (LIORESAL) 10 MG tablet Take 1 tablet by mouth 4 (four) times daily as needed for muscle spasms.     [provider]  budesonide-formoterol (SYMBICORT) 160-4.5 MCG/ACT inhaler INHALE TWO puffs into lung TWICE DAILY  07/09/22   Hunsucker, Lesia Sago, MD  celecoxib (CELEBREX) 200 MG capsule Take 200 mg by mouth daily.    [provider]  citalopram (CELEXA) 20 MG tablet Take 1 tablet (20 mg total) by mouth daily. 07/04/17   Noralee Stain, DO  diclofenac sodium (VOLTAREN) 1 % GEL Apply 1 application topically 4 (four) times daily. Applied to knees, shoulders, wrists, and ankles    [provider]  furosemide (LASIX) 40 MG tablet 2 tablets (80mg ) by mouth two times a day Patient taking differently: 2 tablets (80mg ) by mouth two times a day  Takes 2 tables in morning and 1 tab in evening 08/18/15   Laurey Morale, MD  levothyroxine (SYNTHROID) 200 MCG tablet Take 1 tablet (200 mcg total) by mouth daily before breakfast. 03/24/19   Helane Rima, DO  levothyroxine (SYNTHROID) 25 MCG tablet Take 1 tab WITH LEVOTHYROXINE 200 MCG on Monday, Wednesday, and Friday. 10/26/19   Helane Rima, DO  metoprolol tartrate (LOPRESSOR) 25 MG tablet Take 0.5 tablets (12.5 mg total) by mouth 2 (two) times daily. Patient taking differently: Take 12.5 mg by mouth daily. 07/04/17   Noralee Stain, DO  nicotine polacrilex (NICORETTE) 4 MG gum Take 4 mg by mouth as needed for smoking cessation.    [provider]  oxyCODONE-acetaminophen (PERCOCET/ROXICET) 5-325 MG tablet Take by mouth every 4 (four) hours as needed for severe pain.  [provider]  potassium chloride SA (K-DUR,KLOR-CON) 20 MEQ tablet 2 tablets (40 mEq) by mouth in the AM and 1 tablet (20 mEq) by mouth in the PM 10/09/16   Laurey Morale, MD  pregabalin (LYRICA) 100 MG capsule Take 1 capsule (100 mg total) by mouth 2 (two) times daily. Patient taking differently: Take 50 mg by mouth 2 (two) times daily. 07/04/17   Noralee Stain, DO  pregabalin (LYRICA) 50 MG capsule 1 capsule Orally Twice a day 30 days 12/15/19   [provider]  Spacer/Aero-Holding Chambers (AEROCHAMBER Z-STAT PLUS [place]) MISC Use as directed 05/10/15   Roslynn Amble, MD  tiotropium (SPIRIVA HANDIHALER) 18 MCG inhalation capsule Place 1 capsule (18 mcg total) into inhaler and inhale daily. 07/09/22   Hunsucker, Lesia Sago, MD  Vitamin D, Ergocalciferol, (DRISDOL) 1.25 MG (50000 UNIT) CAPS capsule Take 1 capsule (50,000 Units total) by mouth every 7 (seven) days. 05/10/19   Helane Rima, DO      Allergies    Gabapentin and Novocain [procaine]    Review of Systems   Review of Systems  Constitutional:  Negative for fever.  Respiratory:  Negative for shortness of breath.   Cardiovascular:  Negative for chest pain.  Gastrointestinal:  Negative for abdominal pain.  Skin:  Positive for wound.  Neurological:  Negative for headaches.    Physical Exam Updated Vital Signs BP (!) 125/93 (BP Location: Right Arm)   Pulse 62   Temp 97.8 F (36.6 C) (Oral)   Resp 16   Ht 5\' 2"  (1.575 m)   Wt (!) 158.8 kg   SpO2 93%   BMI 64.03 kg/m  Physical Exam Vitals and nursing note reviewed.  Constitutional:      General: She is not in acute distress.    Appearance: Normal appearance. She is well-developed. She is obese.  HENT:     Head: Normocephalic and atraumatic.  Eyes:     Conjunctiva/sclera: Conjunctivae normal.  Cardiovascular:     Rate and Rhythm: Normal rate and regular rhythm.     Heart sounds: No murmur heard. Pulmonary:     Effort: Pulmonary effort is normal. No respiratory distress.     Breath sounds: Normal breath sounds.  Abdominal:     Palpations: Abdomen is soft.     Tenderness: There is no abdominal tenderness.  Musculoskeletal:        General: Swelling, tenderness and signs of injury present.     Cervical back: Neck supple.     Comments: He has chronic lymphedema of both lower extremities.  Dorsum of left foot has a skin tear.  She also has some blood through her second and fifth toenail.  Toenails are thickened and dystrophic.  Ankle and foot are diffusely tender.  Nontender knee and hip.  Skin:    General: Skin is warm and  dry.     Capillary Refill: Capillary refill takes less than 2 seconds.  Neurological:     General: No focal deficit present.     Mental Status: She is alert.     ED Results / Procedures / Treatments   Labs (all labs ordered are listed, but only abnormal results are displayed) Labs Reviewed - No data to display  EKG None  Radiology DG Foot Complete Left Result Date: 05/22/2023 CLINICAL DATA:  Fall with pain EXAM: LEFT FOOT - COMPLETE 3+ VIEW COMPARISON:  10/01/2009 FINDINGS: Osseous demineralization. Protuberant soft tissue thickening/mass/swelling over the dorsum of the foot. Osseous demineralization limits  fracture assessment. Suspicion of acute mildly impacted fracture involving the neck of fifth proximal phalanx. Possible nondisplaced intra-articular fracture at the base of the first distal phalanx. IMPRESSION: 1. Osseous demineralization limits fracture assessment. Possible nondisplaced fractures involving the neck of fifth proximal phalanx and base of first distal phalanx. 2. Protuberant soft tissue thickening/mass/swelling over the dorsum of the foot. Electronically Signed   By: Jasmine Pang M.D.   On: 05/22/2023 23:35   DG Ankle Complete Left Result Date: 05/22/2023 CLINICAL DATA:  Fall with pain EXAM: LEFT ANKLE COMPLETE - 3+ VIEW COMPARISON:  None Available. FINDINGS: Osseous demineralization. Diffuse soft tissue swelling. Suspected acute nondisplaced distal fibular fracture. Ankle mortise is symmetric IMPRESSION: Suspected acute nondisplaced distal fibular fracture. Electronically Signed   By: Jasmine Pang M.D.   On: 05/22/2023 23:31    Procedures Procedures  {Document cardiac monitor, telemetry assessment procedure when appropriate:1}  Medications Ordered in ED Medications  Tdap (BOOSTRIX) injection 0.5 mL (0.5 mLs Intramuscular Given 05/22/23 2337)    ED Course/ Medical Decision Making/ A&P Clinical Course as of 05/22/23 2356  Thu May 22, 2023  2228 Patient's x-ray is  limited due to habitus although I do not see any gross fracture.  Awaiting radiology reading. [MB]  2245 Patient's  husband is here.  I updated him on the plan. [MB]  2334 Radiology is called suspected distal fibular fracture. [MB]  2339 X-rays of foot showing possible fifth metatarsal neck and first base of phalanx fracture [MB]    Clinical Course User Index [MB] Terrilee Files, MD   {   Click here for ABCD2, HEART and other calculatorsREFRESH Note before signing :1}                              Medical Decision Making Amount and/or Complexity of Data Reviewed Radiology: ordered.  Risk Prescription drug management.   This patient complains of ***; this involves an extensive number of treatment Options and is a complaint that carries with it a high risk of complications and morbidity. The differential includes ***  I ordered, reviewed and interpreted labs, which included *** I ordered medication *** and reviewed PMP when indicated. I ordered imaging studies which included *** and I independently    visualized and interpreted imaging which showed *** Additional history obtained from *** Previous records obtained and reviewed *** I consulted *** and discussed lab and imaging findings and discussed disposition.  Cardiac monitoring reviewed, *** Social determinants considered, *** Critical Interventions: ***  After the interventions stated above, I reevaluated the patient and found *** Admission and further testing considered, ***   {Document critical care time when appropriate:1} {Document review of labs and clinical decision tools ie heart score, Chads2Vasc2 etc:1}  {Document your independent review of radiology images, and any outside records:1} {Document your discussion with family members, caretakers, and with consultants:1} {Document social determinants of health affecting pt's care:1} {Document your decision making why or why not admission, treatments were  needed:1} Final Clinical Impression(s) / ED Diagnoses Final diagnoses:  Fall, initial encounter  Closed fracture of distal end of left fibula, unspecified fracture morphology, initial encounter  Closed nondisplaced fracture of proximal phalanx of lesser toe of left foot with routine healing, subsequent encounter  Closed nondisplaced fracture of distal phalanx of left great toe, initial encounter    Rx / DC Orders ED Discharge Orders     None

## 2023-05-22 NOTE — ED Triage Notes (Signed)
 Pt arrived from home via [facility] s/p fall from standing to floor while attempting to get in recliner. Pt states that her left ankle gave out on her and that she heard a loud pop.

## 2023-05-22 NOTE — ED Provider Notes (Signed)
 [place] EMERGENCY DEPARTMENT AT [facility] Provider Note   CSN: [id number] Arrival date & time: 05/22/23  2112     History {Add pertinent medical, surgical, social history, OB history to HPI:1} Chief Complaint  Patient presents with   Morgan Odonnell is a 73 y.o. female.  She is brought in by EMS after a fall at home.  She was transferring from chair to a different chair when her left knee gave out on her and she fell to the floor scraping her left foot.  She has some skin tear to her left foot and has left ankle pain.  Denies hitting her head or neck no loss of consciousness.  She is not on any blood thinners.  She has significant lymphedema of her lower extremities and is on chronic oxygen.  She said she falls fairly frequently.  Lives at home with her husband and her kids  The history is provided by the patient and the EMS personnel.  Fall This is a new problem. The current episode started 1 to 2 hours ago. The problem has not changed since onset.Pertinent negatives include no chest pain, no abdominal pain, no headaches and no shortness of breath. Associated symptoms comments: Left foot and ankle pain. Nothing aggravates the symptoms. Nothing relieves the symptoms. She has tried nothing for the symptoms. The treatment provided no relief.       Home Medications Prior to Admission medications   Medication Sig Start Date End Date Taking? Authorizing Provider  albuterol (PROVENTIL HFA;VENTOLIN HFA) 108 (90 BASE) MCG/ACT inhaler Inhale 2 puffs into the lungs every 6 (six) hours as needed for wheezing.     [provider]  aspirin EC 81 MG EC tablet Take 1 tablet (81 mg total) by mouth daily. 07/05/17   Noralee Stain, DO  baclofen (LIORESAL) 10 MG tablet Take 1 tablet by mouth 4 (four) times daily as needed for muscle spasms.     [provider]  budesonide-formoterol (SYMBICORT) 160-4.5 MCG/ACT inhaler INHALE TWO puffs into lung TWICE DAILY  07/09/22   Hunsucker, Lesia Sago, MD  celecoxib (CELEBREX) 200 MG capsule Take 200 mg by mouth daily.    [provider]  citalopram (CELEXA) 20 MG tablet Take 1 tablet (20 mg total) by mouth daily. 07/04/17   Noralee Stain, DO  diclofenac sodium (VOLTAREN) 1 % GEL Apply 1 application topically 4 (four) times daily. Applied to knees, shoulders, wrists, and ankles    [provider]  furosemide (LASIX) 40 MG tablet 2 tablets (80mg ) by mouth two times a day Patient taking differently: 2 tablets (80mg ) by mouth two times a day  Takes 2 tables in morning and 1 tab in evening 08/18/15   Laurey Morale, MD  levothyroxine (SYNTHROID) 200 MCG tablet Take 1 tablet (200 mcg total) by mouth daily before breakfast. 03/24/19   Helane Rima, DO  levothyroxine (SYNTHROID) 25 MCG tablet Take 1 tab WITH LEVOTHYROXINE 200 MCG on Monday, Wednesday, and Friday. 10/26/19   Helane Rima, DO  metoprolol tartrate (LOPRESSOR) 25 MG tablet Take 0.5 tablets (12.5 mg total) by mouth 2 (two) times daily. Patient taking differently: Take 12.5 mg by mouth daily. 07/04/17   Noralee Stain, DO  nicotine polacrilex (NICORETTE) 4 MG gum Take 4 mg by mouth as needed for smoking cessation.    [provider]  oxyCODONE-acetaminophen (PERCOCET/ROXICET) 5-325 MG tablet Take by mouth every 4 (four) hours as needed for severe pain.  [provider]  potassium chloride SA (K-DUR,KLOR-CON) 20 MEQ tablet 2 tablets (40 mEq) by mouth in the AM and 1 tablet (20 mEq) by mouth in the PM 10/09/16   Laurey Morale, MD  pregabalin (LYRICA) 100 MG capsule Take 1 capsule (100 mg total) by mouth 2 (two) times daily. Patient taking differently: Take 50 mg by mouth 2 (two) times daily. 07/04/17   Noralee Stain, DO  pregabalin (LYRICA) 50 MG capsule 1 capsule Orally Twice a day 30 days 12/15/19   [provider]  Spacer/Aero-Holding Chambers (AEROCHAMBER Z-STAT PLUS [place]) MISC Use as directed 05/10/15   Roslynn Amble, MD  tiotropium (SPIRIVA HANDIHALER) 18 MCG inhalation capsule Place 1 capsule (18 mcg total) into inhaler and inhale daily. 07/09/22   Hunsucker, Lesia Sago, MD  Vitamin D, Ergocalciferol, (DRISDOL) 1.25 MG (50000 UNIT) CAPS capsule Take 1 capsule (50,000 Units total) by mouth every 7 (seven) days. 05/10/19   Helane Rima, DO      Allergies    Gabapentin and Novocain [procaine]    Review of Systems   Review of Systems  Constitutional:  Negative for fever.  Respiratory:  Negative for shortness of breath.   Cardiovascular:  Negative for chest pain.  Gastrointestinal:  Negative for abdominal pain.  Skin:  Positive for wound.  Neurological:  Negative for headaches.    Physical Exam Updated Vital Signs There were no vitals taken for this visit. Physical Exam Vitals and nursing note reviewed.  Constitutional:      General: She is not in acute distress.    Appearance: Normal appearance. She is well-developed. She is obese.  HENT:     Head: Normocephalic and atraumatic.  Eyes:     Conjunctiva/sclera: Conjunctivae normal.  Cardiovascular:     Rate and Rhythm: Normal rate and regular rhythm.     Heart sounds: No murmur heard. Pulmonary:     Effort: Pulmonary effort is normal. No respiratory distress.     Breath sounds: Normal breath sounds.  Abdominal:     Palpations: Abdomen is soft.     Tenderness: There is no abdominal tenderness.  Musculoskeletal:        General: Swelling, tenderness and signs of injury present.     Cervical back: Neck supple.     Comments: He has chronic lymphedema of both lower extremities.  Dorsum of left foot has a skin tear.  She also has some blood through her second and fifth toenail.  Toenails are thickened and dystrophic.  Ankle and foot are diffusely tender.  Nontender knee and hip.  Skin:    General: Skin is warm and dry.     Capillary Refill: Capillary refill takes less than 2 seconds.  Neurological:     General: No focal deficit  present.     Mental Status: She is alert.     ED Results / Procedures / Treatments   Labs (all labs ordered are listed, but only abnormal results are displayed) Labs Reviewed - No data to display  EKG None  Radiology No results found.  Procedures Procedures  {Document cardiac monitor, telemetry assessment procedure when appropriate:1}  Medications Ordered in ED Medications - No data to display  ED Course/ Medical Decision Making/ A&P   {   Click here for ABCD2, HEART and other calculatorsREFRESH Note before signing :1}  Medical Decision Making Amount and/or Complexity of Data Reviewed Radiology: ordered.  Risk Prescription drug management.   This patient complains of ***; this involves an extensive number of treatment Options and is a complaint that carries with it a high risk of complications and morbidity. The differential includes ***  I ordered, reviewed and interpreted labs, which included *** I ordered medication *** and reviewed PMP when indicated. I ordered imaging studies which included *** and I independently    visualized and interpreted imaging which showed *** Additional history obtained from *** Previous records obtained and reviewed *** I consulted *** and discussed lab and imaging findings and discussed disposition.  Cardiac monitoring reviewed, *** Social determinants considered, *** Critical Interventions: ***  After the interventions stated above, I reevaluated the patient and found *** Admission and further testing considered, ***   {Document critical care time when appropriate:1} {Document review of labs and clinical decision tools ie heart score, Chads2Vasc2 etc:1}  {Document your independent review of radiology images, and any outside records:1} {Document your discussion with family members, caretakers, and with consultants:1} {Document social determinants of health affecting pt's care:1} {Document your  decision making why or why not admission, treatments were needed:1} Final Clinical Impression(s) / ED Diagnoses Final diagnoses:  None    Rx / DC Orders ED Discharge Orders     None

## 2023-05-22 NOTE — ED Notes (Signed)
 Cleaned wound on foot and toes with sterile water and applied wet to dry dressing on foot and toes.PT tolerated cleaning well.

## 2023-05-23 ENCOUNTER — Ambulatory Visit: Payer: Medicaid Other | Admitting: Pulmonary Disease

## 2023-05-23 ENCOUNTER — Encounter: Payer: Self-pay | Admitting: Pulmonary Disease

## 2023-05-23 DIAGNOSIS — S82832A Other fracture of upper and lower end of left fibula, initial encounter for closed fracture: Secondary | ICD-10-CM | POA: Diagnosis not present

## 2023-05-23 NOTE — Progress Notes (Signed)
 Orthopedic Tech Progress Note Patient Details:  Morgan Odonnell 02-Nov-1950 [id number] Could only apply post op shoe. Aircast and ASO lace up are at bedside. Waiting to hear the next steps from MD. Ortho Devices Type of Ortho Device: Postop shoe/boot, ASO, Ankle Air splint Ortho Device/Splint Location: LLE Ortho Device/Splint Interventions: Ordered, Application, Adjustment   Post Interventions Patient Tolerated: Well Instructions Provided: Adjustment of device, Care of device, Poper ambulation with device  Blase Mess 05/23/2023, 1:02 AM

## 2023-05-23 NOTE — ED Notes (Addendum)
 Ortho tech able to apply post op shoe only.

## 2023-05-23 NOTE — ED Notes (Signed)
 Spoke to patients husband and patient has a Runner, broadcasting/film/video chair at home she can utilize. Will need PTAR to assist with getting in the house. PTAR called.

## 2023-06-18 ENCOUNTER — Other Ambulatory Visit: Payer: Self-pay | Admitting: Pulmonary Disease

## 2023-07-15 ENCOUNTER — Other Ambulatory Visit: Payer: Self-pay | Admitting: Pulmonary Disease

## 2023-07-17 ENCOUNTER — Other Ambulatory Visit: Payer: Self-pay | Admitting: Pulmonary Disease

## 2023-08-06 ENCOUNTER — Other Ambulatory Visit: Payer: Self-pay | Admitting: Pulmonary Disease

## 2023-08-06 NOTE — Telephone Encounter (Signed)
 ATC x1. LVM to call office back regarding refill for Symbicort  and Spiriva  inhaler.

## 2023-08-07 NOTE — Telephone Encounter (Signed)
 ATCx2. Unable to LVM.

## 2023-08-15 ENCOUNTER — Encounter (HOSPITAL_COMMUNITY): Payer: Self-pay

## 2023-08-15 ENCOUNTER — Emergency Department (HOSPITAL_COMMUNITY)

## 2023-08-15 ENCOUNTER — Inpatient Hospital Stay (HOSPITAL_COMMUNITY)
Admission: EM | Admit: 2023-08-15 | Discharge: 2023-09-01 | DRG: 291 | Disposition: A | Discharge status: Skilled Nursing Facility | Attending: Family Medicine | Admitting: Family Medicine

## 2023-08-15 DIAGNOSIS — G4733 Obstructive sleep apnea (adult) (pediatric): Secondary | ICD-10-CM | POA: Diagnosis not present

## 2023-08-15 DIAGNOSIS — Z7189 Other specified counseling: Secondary | ICD-10-CM

## 2023-08-15 DIAGNOSIS — I5032 Chronic diastolic (congestive) heart failure: Secondary | ICD-10-CM

## 2023-08-15 DIAGNOSIS — R630 Anorexia: Secondary | ICD-10-CM | POA: Diagnosis not present

## 2023-08-15 DIAGNOSIS — J449 Chronic obstructive pulmonary disease, unspecified: Secondary | ICD-10-CM | POA: Diagnosis present

## 2023-08-15 DIAGNOSIS — N182 Chronic kidney disease, stage 2 (mild): Secondary | ICD-10-CM | POA: Diagnosis present

## 2023-08-15 DIAGNOSIS — Z1152 Encounter for screening for COVID-19: Secondary | ICD-10-CM

## 2023-08-15 DIAGNOSIS — R4589 Other symptoms and signs involving emotional state: Secondary | ICD-10-CM

## 2023-08-15 DIAGNOSIS — D649 Anemia, unspecified: Secondary | ICD-10-CM | POA: Diagnosis present

## 2023-08-15 DIAGNOSIS — Z8249 Family history of ischemic heart disease and other diseases of the circulatory system: Secondary | ICD-10-CM

## 2023-08-15 DIAGNOSIS — R1310 Dysphagia, unspecified: Secondary | ICD-10-CM | POA: Diagnosis not present

## 2023-08-15 DIAGNOSIS — Z7951 Long term (current) use of inhaled steroids: Secondary | ICD-10-CM

## 2023-08-15 DIAGNOSIS — J189 Pneumonia, unspecified organism: Secondary | ICD-10-CM | POA: Diagnosis present

## 2023-08-15 DIAGNOSIS — I5031 Acute diastolic (congestive) heart failure: Secondary | ICD-10-CM | POA: Diagnosis not present

## 2023-08-15 DIAGNOSIS — N179 Acute kidney failure, unspecified: Secondary | ICD-10-CM | POA: Diagnosis present

## 2023-08-15 DIAGNOSIS — J441 Chronic obstructive pulmonary disease with (acute) exacerbation: Secondary | ICD-10-CM | POA: Diagnosis present

## 2023-08-15 DIAGNOSIS — Z811 Family history of alcohol abuse and dependence: Secondary | ICD-10-CM

## 2023-08-15 DIAGNOSIS — E222 Syndrome of inappropriate secretion of antidiuretic hormone: Secondary | ICD-10-CM | POA: Diagnosis present

## 2023-08-15 DIAGNOSIS — K5900 Constipation, unspecified: Secondary | ICD-10-CM | POA: Diagnosis present

## 2023-08-15 DIAGNOSIS — J9622 Acute and chronic respiratory failure with hypercapnia: Secondary | ICD-10-CM | POA: Diagnosis present

## 2023-08-15 DIAGNOSIS — G8929 Other chronic pain: Secondary | ICD-10-CM | POA: Diagnosis present

## 2023-08-15 DIAGNOSIS — Z91199 Patient's noncompliance with other medical treatment and regimen due to unspecified reason: Secondary | ICD-10-CM

## 2023-08-15 DIAGNOSIS — Y95 Nosocomial condition: Secondary | ICD-10-CM | POA: Diagnosis present

## 2023-08-15 DIAGNOSIS — J810 Acute pulmonary edema: Secondary | ICD-10-CM | POA: Diagnosis not present

## 2023-08-15 DIAGNOSIS — I2729 Other secondary pulmonary hypertension: Secondary | ICD-10-CM | POA: Diagnosis present

## 2023-08-15 DIAGNOSIS — E871 Hypo-osmolality and hyponatremia: Secondary | ICD-10-CM | POA: Insufficient documentation

## 2023-08-15 DIAGNOSIS — I2723 Pulmonary hypertension due to lung diseases and hypoxia: Secondary | ICD-10-CM | POA: Diagnosis present

## 2023-08-15 DIAGNOSIS — Z79899 Other long term (current) drug therapy: Secondary | ICD-10-CM

## 2023-08-15 DIAGNOSIS — Z6841 Body Mass Index (BMI) 40.0 and over, adult: Secondary | ICD-10-CM

## 2023-08-15 DIAGNOSIS — I509 Heart failure, unspecified: Principal | ICD-10-CM

## 2023-08-15 DIAGNOSIS — Z87891 Personal history of nicotine dependence: Secondary | ICD-10-CM

## 2023-08-15 DIAGNOSIS — I130 Hypertensive heart and chronic kidney disease with heart failure and stage 1 through stage 4 chronic kidney disease, or unspecified chronic kidney disease: Principal | ICD-10-CM | POA: Diagnosis present

## 2023-08-15 DIAGNOSIS — J9601 Acute respiratory failure with hypoxia: Secondary | ICD-10-CM

## 2023-08-15 DIAGNOSIS — I959 Hypotension, unspecified: Secondary | ICD-10-CM | POA: Diagnosis present

## 2023-08-15 DIAGNOSIS — Z79890 Hormone replacement therapy (postmenopausal): Secondary | ICD-10-CM

## 2023-08-15 DIAGNOSIS — F39 Unspecified mood [affective] disorder: Secondary | ICD-10-CM | POA: Diagnosis present

## 2023-08-15 DIAGNOSIS — Z66 Do not resuscitate: Secondary | ICD-10-CM | POA: Diagnosis present

## 2023-08-15 DIAGNOSIS — J9811 Atelectasis: Secondary | ICD-10-CM | POA: Diagnosis present

## 2023-08-15 DIAGNOSIS — R4189 Other symptoms and signs involving cognitive functions and awareness: Secondary | ICD-10-CM | POA: Diagnosis present

## 2023-08-15 DIAGNOSIS — G4710 Hypersomnia, unspecified: Secondary | ICD-10-CM | POA: Diagnosis present

## 2023-08-15 DIAGNOSIS — I890 Lymphedema, not elsewhere classified: Secondary | ICD-10-CM | POA: Diagnosis not present

## 2023-08-15 DIAGNOSIS — Z888 Allergy status to other drugs, medicaments and biological substances status: Secondary | ICD-10-CM

## 2023-08-15 DIAGNOSIS — Z90721 Acquired absence of ovaries, unilateral: Secondary | ICD-10-CM

## 2023-08-15 DIAGNOSIS — E039 Hypothyroidism, unspecified: Secondary | ICD-10-CM | POA: Diagnosis present

## 2023-08-15 DIAGNOSIS — Z8041 Family history of malignant neoplasm of ovary: Secondary | ICD-10-CM

## 2023-08-15 DIAGNOSIS — J984 Other disorders of lung: Secondary | ICD-10-CM | POA: Diagnosis present

## 2023-08-15 DIAGNOSIS — Z6372 Alcoholism and drug addiction in family: Secondary | ICD-10-CM

## 2023-08-15 DIAGNOSIS — I5033 Acute on chronic diastolic (congestive) heart failure: Secondary | ICD-10-CM | POA: Diagnosis present

## 2023-08-15 DIAGNOSIS — E662 Morbid (severe) obesity with alveolar hypoventilation: Secondary | ICD-10-CM | POA: Diagnosis present

## 2023-08-15 DIAGNOSIS — I878 Other specified disorders of veins: Secondary | ICD-10-CM | POA: Diagnosis present

## 2023-08-15 DIAGNOSIS — J418 Mixed simple and mucopurulent chronic bronchitis: Secondary | ICD-10-CM | POA: Diagnosis not present

## 2023-08-15 DIAGNOSIS — Z807 Family history of other malignant neoplasms of lymphoid, hematopoietic and related tissues: Secondary | ICD-10-CM

## 2023-08-15 DIAGNOSIS — J9621 Acute and chronic respiratory failure with hypoxia: Secondary | ICD-10-CM | POA: Diagnosis present

## 2023-08-15 DIAGNOSIS — E876 Hypokalemia: Secondary | ICD-10-CM | POA: Diagnosis not present

## 2023-08-15 DIAGNOSIS — I2781 Cor pulmonale (chronic): Secondary | ICD-10-CM | POA: Diagnosis not present

## 2023-08-15 DIAGNOSIS — Z515 Encounter for palliative care: Secondary | ICD-10-CM | POA: Diagnosis not present

## 2023-08-15 DIAGNOSIS — Z7982 Long term (current) use of aspirin: Secondary | ICD-10-CM

## 2023-08-15 DIAGNOSIS — Z9981 Dependence on supplemental oxygen: Secondary | ICD-10-CM

## 2023-08-15 DIAGNOSIS — Z825 Family history of asthma and other chronic lower respiratory diseases: Secondary | ICD-10-CM

## 2023-08-15 DIAGNOSIS — E861 Hypovolemia: Secondary | ICD-10-CM | POA: Diagnosis present

## 2023-08-15 DIAGNOSIS — Z818 Family history of other mental and behavioral disorders: Secondary | ICD-10-CM

## 2023-08-15 LAB — CBC
HCT: 33.9 % — ABNORMAL LOW (ref 36.0–46.0)
Hemoglobin: 9.9 g/dL — ABNORMAL LOW (ref 12.0–15.0)
MCH: 27.2 pg (ref 26.0–34.0)
MCHC: 29.2 g/dL — ABNORMAL LOW (ref 30.0–36.0)
MCV: 93.1 fL (ref 80.0–100.0)
Platelets: 163 10*3/uL (ref 150–400)
RBC: 3.64 MIL/uL — ABNORMAL LOW (ref 3.87–5.11)
RDW: 16.7 % — ABNORMAL HIGH (ref 11.5–15.5)
WBC: 7.2 10*3/uL (ref 4.0–10.5)
nRBC: 0 % (ref 0.0–0.2)

## 2023-08-15 LAB — URINALYSIS, ROUTINE W REFLEX MICROSCOPIC
Bacteria, UA: NONE SEEN
Bilirubin Urine: NEGATIVE
Glucose, UA: NEGATIVE mg/dL
Ketones, ur: NEGATIVE mg/dL
Leukocytes,Ua: NEGATIVE
Nitrite: NEGATIVE
Protein, ur: NEGATIVE mg/dL
Specific Gravity, Urine: 1.017 (ref 1.005–1.030)
pH: 5 (ref 5.0–8.0)

## 2023-08-15 LAB — COMPREHENSIVE METABOLIC PANEL WITH GFR
ALT: 11 U/L (ref 0–44)
AST: 23 U/L (ref 15–41)
Albumin: 2.6 g/dL — ABNORMAL LOW (ref 3.5–5.0)
Alkaline Phosphatase: 79 U/L (ref 38–126)
Anion gap: 7 (ref 5–15)
BUN: 15 mg/dL (ref 8–23)
CO2: 38 mmol/L — ABNORMAL HIGH (ref 22–32)
Calcium: 8.8 mg/dL — ABNORMAL LOW (ref 8.9–10.3)
Chloride: 91 mmol/L — ABNORMAL LOW (ref 98–111)
Creatinine, Ser: 0.69 mg/dL (ref 0.44–1.00)
GFR, Estimated: >60 mL/min (ref >60)
Glucose, Bld: 126 mg/dL — ABNORMAL HIGH (ref 70–99)
Potassium: 3.5 mmol/L (ref 3.5–5.1)
Sodium: 136 mmol/L (ref 135–145)
Total Bilirubin: 0.8 mg/dL (ref 0.0–1.2)
Total Protein: 8.3 g/dL — ABNORMAL HIGH (ref 6.5–8.1)

## 2023-08-15 LAB — BRAIN NATRIURETIC PEPTIDE: B Natriuretic Peptide: 821.9 pg/mL — ABNORMAL HIGH (ref 0.0–100.0)

## 2023-08-15 MED ORDER — POLYETHYLENE GLYCOL 3350 17 G PO PACK
17.0000 g | PACK | Freq: Every day | ORAL | Status: DC | PRN
  Administered 2023-08-22 – 2023-08-29 (×3): 17 g via ORAL
  Filled 2023-08-15 (×3): qty 1

## 2023-08-15 MED ORDER — NICOTINE 21 MG/24HR TD PT24
21.0000 mg | MEDICATED_PATCH | Freq: Every day | TRANSDERMAL | Status: DC
  Administered 2023-08-15 – 2023-09-01 (×18): 21 mg via TRANSDERMAL
  Filled 2023-08-15 (×18): qty 1

## 2023-08-15 MED ORDER — FUROSEMIDE 10 MG/ML IJ SOLN
40.0000 mg | Freq: Once | INTRAMUSCULAR | Status: AC
  Administered 2023-08-15: 40 mg via INTRAVENOUS
  Filled 2023-08-15: qty 4

## 2023-08-15 MED ORDER — MELATONIN 3 MG PO TABS
6.0000 mg | ORAL_TABLET | Freq: Every evening | ORAL | Status: DC | PRN
  Administered 2023-08-16 – 2023-08-29 (×5): 6 mg via ORAL
  Filled 2023-08-15 (×7): qty 2

## 2023-08-15 MED ORDER — ACETAMINOPHEN 500 MG PO TABS
1000.0000 mg | ORAL_TABLET | Freq: Four times a day (QID) | ORAL | Status: DC | PRN
  Administered 2023-08-16 – 2023-08-29 (×6): 1000 mg via ORAL
  Filled 2023-08-15 (×7): qty 2

## 2023-08-15 MED ORDER — ALBUTEROL SULFATE (2.5 MG/3ML) 0.083% IN NEBU: 2.5000 mg | INHALATION_SOLUTION | RESPIRATORY_TRACT | Status: DC | PRN

## 2023-08-15 MED ORDER — SODIUM CHLORIDE 0.9% FLUSH
3.0000 mL | Freq: Two times a day (BID) | INTRAVENOUS | Status: DC
  Administered 2023-08-16 – 2023-08-31 (×32): 3 mL via INTRAVENOUS

## 2023-08-15 MED ORDER — FUROSEMIDE 10 MG/ML IJ SOLN: 80.0000 mg | Freq: Two times a day (BID) | INTRAMUSCULAR | Status: DC

## 2023-08-15 NOTE — ED Provider Notes (Signed)
 [place] EMERGENCY DEPARTMENT AT [facility] Provider Note   CSN: [id number] Arrival date & time: 08/15/23  2009     History  Chief Complaint  Patient presents with   Weakness    Morgan Odonnell is a 73 y.o. female history of heart failure, COPD, lymphedema, CKD here presenting with weakness and altered mental status.  Patient lives at home with her husband.  She was noted to be less responsive than usual since yesterday.  Patient also has worsening shortness of breath.  Patient is on 4 L nasal cannula at baseline.  She states that her urine is darker than usual and streaks concern for possible UTI.  The history is provided by the patient.       Home Medications Prior to Admission medications   Medication Sig Start Date End Date Taking? Authorizing Provider  cyclobenzaprine (FEXMID) 7.5 MG tablet Take 7.5 mg by mouth 3 (three) times daily. 07/15/23  Yes [provider]  fluconazole (DIFLUCAN) 150 MG tablet Take 150 mg by mouth daily. 05/19/23  Yes [provider]  naloxone  (NARCAN ) nasal spray 4 mg/0.1 mL Place 1 spray into the nose once. 07/15/23  Yes [provider]  pregabalin  (LYRICA ) 75 MG capsule Take 75 mg by mouth every 12 (twelve) hours. 07/15/23  Yes [provider]  propranolol (INDERAL) 10 MG tablet Take 10 mg by mouth 2 (two) times daily as needed. 07/04/23  Yes [provider]  sulfamethoxazole-trimethoprim (BACTRIM DS) 800-160 MG tablet Take 1 tablet by mouth 2 (two) times daily. 02/19/23  Yes [provider]  albuterol  (PROVENTIL  HFA;VENTOLIN  HFA) 108 (90 BASE) MCG/ACT inhaler Inhale 2 puffs into the lungs every 6 (six) hours as needed for wheezing.     [provider]  aspirin  EC 81 MG EC tablet Take 1 tablet (81 mg total) by mouth daily. 07/05/17   Daren Eck, DO  baclofen (LIORESAL) 10 MG tablet Take 1 tablet by mouth 4 (four) times daily as needed for muscle spasms.     [provider]  budesonide -formoterol  (SYMBICORT ) 160-4.5 MCG/ACT inhaler INHALE TWO puffs into lung TWICE DAILY 07/09/22   Hunsucker, Archer Kobs, MD  celecoxib (CELEBREX) 200 MG capsule Take 200 mg by mouth daily.    [provider]  citalopram  (CELEXA ) 20 MG tablet Take 1 tablet (20 mg total) by mouth daily. 07/04/17   Daren Eck, DO  diclofenac  sodium (VOLTAREN ) 1 % GEL Apply 1 application topically 4 (four) times daily. Applied to knees, shoulders, wrists, and ankles    [provider]  furosemide  (LASIX ) 40 MG tablet 2 tablets (80mg ) by mouth two times a day Patient taking differently: 2 tablets (80mg ) by mouth two times a day  Takes 2 tables in morning and 1 tab in evening 08/18/15   Darlis Eisenmenger, MD  levothyroxine  (SYNTHROID ) 200 MCG tablet Take 1 tablet (200 mcg total) by mouth daily before breakfast. 03/24/19   Jonn Nett, DO  levothyroxine  (SYNTHROID ) 25 MCG tablet Take 1 tab WITH LEVOTHYROXINE  200 MCG on Monday, Wednesday, and Friday. 10/26/19   Jonn Nett, DO  metoprolol  tartrate (LOPRESSOR ) 25 MG tablet Take 0.5 tablets (12.5 mg total) by mouth 2 (two) times daily. Patient taking differently: Take 12.5 mg by mouth daily. 07/04/17   Daren Eck, DO  nicotine  polacrilex (NICORETTE ) 4 MG gum Take 4 mg by mouth as needed for smoking cessation.    [provider]  oxyCODONE-acetaminophen  (PERCOCET/ROXICET) 5-325 MG tablet Take by mouth every  4 (four) hours as needed for severe pain.    [provider]  potassium chloride  SA (K-DUR,KLOR-CON ) 20 MEQ tablet 2 tablets (40 mEq) by mouth in the AM and 1 tablet (20 mEq) by mouth in the PM 10/09/16   Darlis Eisenmenger, MD  pregabalin  (LYRICA ) 100 MG capsule Take 1 capsule (100 mg total) by mouth 2 (two) times daily. Patient taking differently: Take 50 mg by mouth 2 (two) times daily. 07/04/17   Daren Eck, DO  pregabalin  (LYRICA ) 50 MG capsule 1 capsule Orally Twice a day 30 days 12/15/19   [provider]   Spacer/Aero-Holding Chambers (AEROCHAMBER Z-STAT PLUS [facility]) MISC Use as directed 05/10/15   Nestor, Jennings E, MD  tiotropium (SPIRIVA  HANDIHALER) 18 MCG inhalation capsule Place 1 capsule (18 mcg total) into inhaler and inhale daily. 07/09/22   Hunsucker, Archer Kobs, MD  Vitamin D , Ergocalciferol , (DRISDOL ) 1.25 MG (50000 UNIT) CAPS capsule Take 1 capsule (50,000 Units total) by mouth every 7 (seven) days. 05/10/19   Jonn Nett, DO      Allergies    Gabapentin and Novocain [procaine]    Review of Systems   Review of Systems  Respiratory:  Positive for shortness of breath.   Neurological:  Positive for weakness.  All other systems reviewed and are negative.   Physical Exam Updated Vital Signs BP (!) 140/67   Pulse 68   Temp 98.4 F (36.9 C) (Oral)   Resp 18   SpO2 100%  Physical Exam Vitals and nursing note reviewed.  Constitutional:      Comments: Chronically ill  HENT:     Head: Normocephalic.     Nose: Nose normal.     Mouth/Throat:     Mouth: Mucous membranes are moist.  Eyes:     Extraocular Movements: Extraocular movements intact.     Pupils: Pupils are equal, round, and reactive to light.  Cardiovascular:     Rate and Rhythm: Normal rate and regular rhythm.     Pulses: Normal pulses.  Pulmonary:     Comments: Crackle bilateral bases Abdominal:     General: Abdomen is flat.     Palpations: Abdomen is soft.  Musculoskeletal:     Cervical back: Normal range of motion.     Comments: 2+ edema bilaterally   Skin:    General: Skin is warm.     Capillary Refill: Capillary refill takes less than 2 seconds.  Neurological:     General: No focal deficit present.     Mental Status: She is oriented to person, place, and time.  Psychiatric:        Mood and Affect: Mood normal.        Behavior: Behavior normal.     ED Results / Procedures / Treatments   Labs (all labs ordered are listed, but only abnormal results are displayed) Labs Reviewed  COMPREHENSIVE  METABOLIC PANEL WITH GFR - Abnormal; Notable for the following components:      Result Value   Chloride 91 (*)    CO2 38 (*)    Glucose, Bld 126 (*)    Calcium  8.8 (*)    Total Protein 8.3 (*)    Albumin 2.6 (*)    All other components within normal limits  CBC - Abnormal; Notable for the following components:   RBC 3.64 (*)    Hemoglobin 9.9 (*)    HCT 33.9 (*)    MCHC 29.2 (*)    RDW 16.7 (*)  All other components within normal limits  URINALYSIS, ROUTINE W REFLEX MICROSCOPIC - Abnormal; Notable for the following components:   APPearance HAZY (*)    Hgb urine dipstick MODERATE (*)    All other components within normal limits  BRAIN NATRIURETIC PEPTIDE - Abnormal; Notable for the following components:   B Natriuretic Peptide 821.9 (*)    All other components within normal limits  BASIC METABOLIC PANEL WITH GFR  CBC  MAGNESIUM  PHOSPHORUS  TSH  BLOOD GAS, VENOUS  FERRITIN  IRON AND TIBC  FOLATE  VITAMIN B12  TRANSFERRIN  RETICULOCYTES  CBG MONITORING, ED  TROPONIN I (HIGH SENSITIVITY)    EKG EKG Interpretation Date/Time:  Friday August 15 2023 20:26:09 EDT Ventricular Rate:  68 PR Interval:  210 QRS Duration:  102 QT Interval:  444 QTC Calculation: 473 R Axis:   108  Text Interpretation: Sinus rhythm Low voltage with right axis deviation Probable right ventricular hypertrophy Nonspecific T abnormalities, inferior leads No significant change since last tracing Confirmed by Florette Hurry ([id number]) on 08/15/2023 8:47:58 PM  Radiology DG Chest Port 1 View Result Date: 08/15/2023 CLINICAL DATA:  Altered mental status and weakness EXAM: PORTABLE CHEST 1 VIEW COMPARISON:  09/2019 FINDINGS: Cardiac shadow remains enlarged. Aortic calcifications are noted. Lungs are well aerated bilaterally. Patchy atelectatic changes are noted in the bases right greater than left. No bony abnormality is noted. IMPRESSION: Basilar atelectasis right greater than left. Electronically Signed   By:  Violeta Grey M.D.   On: 08/15/2023 21:05    Procedures Procedures    Medications Ordered in ED Medications  nicotine  (NICODERM CQ  - dosed in mg/24 hours) patch 21 mg (21 mg Transdermal Patch Applied 08/15/23 2227)  sodium chloride  flush (NS) 0.9 % injection 3 mL (has no administration in time range)  acetaminophen  (TYLENOL ) tablet 1,000 mg (has no administration in time range)  albuterol  (PROVENTIL ) (2.5 MG/3ML) 0.083% nebulizer solution 2.5 mg (has no administration in time range)  melatonin tablet 6 mg (has no administration in time range)  polyethylene glycol (MIRALAX / GLYCOLAX) packet 17 g (has no administration in time range)  furosemide  (LASIX ) injection 80 mg (has no administration in time range)  furosemide  (LASIX ) injection 40 mg (40 mg Intravenous Given 08/15/23 2227)    ED Course/ Medical Decision Making/ A&P                                 Medical Decision Making VALISA KARPEL is a 73 y.o. female history of heart failure here presenting with shortness of breath and confusion.  Considered pneumonia versus heart failure exacerbation versus UTI versus electrolyte abnormality.  Plan to get CBC and CMP and BNP and chest x-ray  10 pm BNP elevated 800.  Patient's chest x-ray showed atelectasis.  Clinically patient is volume overloaded.  Patient is on Lasix  80 mg in the morning and 40 mg at night.  She states that she has trouble walking to the bathroom due to her leg swelling.  Patient was given Lasix  IV.  Will admit for heart failure exacerbation   Problems Addressed: Heart failure, unspecified HF chronicity, unspecified heart failure type [facility]): acute illness or injury  Amount and/or Complexity of Data Reviewed Labs: ordered. Decision-making details documented in ED Course. Radiology: ordered and independent interpretation performed. Decision-making details documented in ED Course. ECG/medicine tests: ordered and independent interpretation performed. Decision-making  details documented in ED Course.  Risk  OTC drugs. Prescription drug management. Decision regarding hospitalization.    Final Clinical Impression(s) / ED Diagnoses Final diagnoses:  Heart failure, unspecified HF chronicity, unspecified heart failure type [facility])    Rx / DC Orders ED Discharge Orders     None         Dalene Duck, MD 08/15/23 2354

## 2023-08-15 NOTE — ED Triage Notes (Signed)
 Coming from home, she has not been acting like her self the last few days, hallucination, weakness, not eating, she is out of her medication as well. She has been alert and oriented x 4 for medic.Some complaints of shortness of breath. She is otherwise stable.   Medic vitals   126/72 70hr 97% 3l baseline  149bgl 19rr

## 2023-08-16 ENCOUNTER — Other Ambulatory Visit: Payer: Self-pay

## 2023-08-16 ENCOUNTER — Inpatient Hospital Stay (HOSPITAL_COMMUNITY)

## 2023-08-16 DIAGNOSIS — J9622 Acute and chronic respiratory failure with hypercapnia: Secondary | ICD-10-CM | POA: Diagnosis not present

## 2023-08-16 DIAGNOSIS — I5033 Acute on chronic diastolic (congestive) heart failure: Secondary | ICD-10-CM

## 2023-08-16 DIAGNOSIS — D649 Anemia, unspecified: Secondary | ICD-10-CM | POA: Diagnosis not present

## 2023-08-16 DIAGNOSIS — I509 Heart failure, unspecified: Secondary | ICD-10-CM

## 2023-08-16 DIAGNOSIS — J9621 Acute and chronic respiratory failure with hypoxia: Secondary | ICD-10-CM

## 2023-08-16 LAB — BASIC METABOLIC PANEL WITH GFR
Anion gap: 7 (ref 5–15)
BUN: 14 mg/dL (ref 8–23)
CO2: 39 mmol/L — ABNORMAL HIGH (ref 22–32)
Calcium: 8.8 mg/dL — ABNORMAL LOW (ref 8.9–10.3)
Chloride: 91 mmol/L — ABNORMAL LOW (ref 98–111)
Creatinine, Ser: 0.57 mg/dL (ref 0.44–1.00)
GFR, Estimated: >60 mL/min (ref >60)
Glucose, Bld: 97 mg/dL (ref 70–99)
Potassium: 3.3 mmol/L — ABNORMAL LOW (ref 3.5–5.1)
Sodium: 137 mmol/L (ref 135–145)

## 2023-08-16 LAB — CBC
HCT: 33.5 % — ABNORMAL LOW (ref 36.0–46.0)
Hemoglobin: 9.9 g/dL — ABNORMAL LOW (ref 12.0–15.0)
MCH: 27.5 pg (ref 26.0–34.0)
MCHC: 29.6 g/dL — ABNORMAL LOW (ref 30.0–36.0)
MCV: 93.1 fL (ref 80.0–100.0)
Platelets: 154 10*3/uL (ref 150–400)
RBC: 3.6 MIL/uL — ABNORMAL LOW (ref 3.87–5.11)
RDW: 16.8 % — ABNORMAL HIGH (ref 11.5–15.5)
WBC: 7.7 10*3/uL (ref 4.0–10.5)
nRBC: 0 % (ref 0.0–0.2)

## 2023-08-16 LAB — BLOOD GAS, VENOUS
Acid-Base Excess: 21.3 mmol/L — ABNORMAL HIGH (ref 0.0–2.0)
Bicarbonate: 50.6 mmol/L — ABNORMAL HIGH (ref 20.0–28.0)
O2 Saturation: 31.4 %
Patient temperature: 37
pCO2, Ven: 78 mmHg (ref 44–60)
pH, Ven: 7.42 (ref 7.25–7.43)
pO2, Ven: <31 mmHg — CL (ref 32–45)

## 2023-08-16 LAB — ECHOCARDIOGRAM COMPLETE
Height: 62 in
S' Lateral: 3.6 cm
Weight: 4712.55 [oz_av]

## 2023-08-16 LAB — RETICULOCYTES
Immature Retic Fract: 14.3 % (ref 2.3–15.9)
RBC.: 3.59 MIL/uL — ABNORMAL LOW (ref 3.87–5.11)
Retic Count, Absolute: 72.5 10*3/uL (ref 19.0–186.0)
Retic Ct Pct: 2 % (ref 0.4–3.1)

## 2023-08-16 LAB — IRON AND TIBC
Iron: 17 ug/dL — ABNORMAL LOW (ref 28–170)
Saturation Ratios: 4 % — ABNORMAL LOW (ref 10.4–31.8)
TIBC: 396 ug/dL (ref 250–450)
UIBC: 379 ug/dL

## 2023-08-16 LAB — TRANSFERRIN: Transferrin: 283 mg/dL (ref 192–382)

## 2023-08-16 LAB — RESP PANEL BY RT-PCR (RSV, FLU A&B, COVID)  RVPGX2
Influenza A by PCR: NEGATIVE
Influenza B by PCR: NEGATIVE
Resp Syncytial Virus by PCR: NEGATIVE
SARS Coronavirus 2 by RT PCR: NEGATIVE

## 2023-08-16 LAB — FOLATE: Folate: 7.8 ng/mL (ref >5.9)

## 2023-08-16 LAB — MRSA NEXT GEN BY PCR, NASAL: MRSA by PCR Next Gen: DETECTED — AB

## 2023-08-16 LAB — MAGNESIUM: Magnesium: 1.9 mg/dL (ref 1.7–2.4)

## 2023-08-16 LAB — T4, FREE: Free T4: 1.17 ng/dL — ABNORMAL HIGH (ref 0.61–1.12)

## 2023-08-16 LAB — TROPONIN I (HIGH SENSITIVITY): Troponin I (High Sensitivity): 8 ng/L (ref <18)

## 2023-08-16 LAB — PHOSPHORUS: Phosphorus: 3 mg/dL (ref 2.5–4.6)

## 2023-08-16 LAB — VITAMIN B12: Vitamin B-12: 813 pg/mL (ref 180–914)

## 2023-08-16 LAB — FERRITIN: Ferritin: 14 ng/mL (ref 11–307)

## 2023-08-16 LAB — TSH: TSH: 11.187 u[IU]/mL — ABNORMAL HIGH (ref 0.350–4.500)

## 2023-08-16 MED ORDER — ORAL CARE MOUTH RINSE: 15.0000 mL | OROMUCOSAL | Status: DC | PRN

## 2023-08-16 MED ORDER — PERFLUTREN LIPID MICROSPHERE
1.0000 mL | INTRAVENOUS | Status: AC | PRN
  Administered 2023-08-16: 3 mL via INTRAVENOUS

## 2023-08-16 MED ORDER — ALUM & MAG HYDROXIDE-SIMETH 200-200-20 MG/5ML PO SUSP
15.0000 mL | ORAL | Status: DC | PRN
  Administered 2023-08-27 – 2023-08-31 (×2): 15 mL via ORAL
  Filled 2023-08-16 (×3): qty 30

## 2023-08-16 MED ORDER — FLUTICASONE FUROATE-VILANTEROL 200-25 MCG/ACT IN AEPB
1.0000 | INHALATION_SPRAY | Freq: Every day | RESPIRATORY_TRACT | Status: DC
  Administered 2023-08-16 – 2023-09-01 (×15): 1 via RESPIRATORY_TRACT
  Filled 2023-08-16 (×2): qty 28

## 2023-08-16 MED ORDER — POTASSIUM CHLORIDE CRYS ER 20 MEQ PO TBCR
40.0000 meq | EXTENDED_RELEASE_TABLET | Freq: Once | ORAL | Status: AC
  Administered 2023-08-16: 40 meq via ORAL
  Filled 2023-08-16: qty 2

## 2023-08-16 MED ORDER — HYDROCERIN EX CREA
TOPICAL_CREAM | Freq: Two times a day (BID) | CUTANEOUS | Status: DC
  Administered 2023-08-18 – 2023-09-01 (×7): 1 via TOPICAL
  Filled 2023-08-16 (×2): qty 113

## 2023-08-16 MED ORDER — UMECLIDINIUM BROMIDE 62.5 MCG/ACT IN AEPB
1.0000 | INHALATION_SPRAY | Freq: Every day | RESPIRATORY_TRACT | Status: DC
  Administered 2023-08-16 – 2023-09-01 (×16): 1 via RESPIRATORY_TRACT
  Filled 2023-08-16 (×4): qty 7

## 2023-08-16 MED ORDER — OXYCODONE-ACETAMINOPHEN 5-325 MG PO TABS
1.0000 | ORAL_TABLET | Freq: Three times a day (TID) | ORAL | Status: DC | PRN
  Administered 2023-08-16 – 2023-08-31 (×23): 1 via ORAL
  Filled 2023-08-16 (×23): qty 1

## 2023-08-16 MED ORDER — GERHARDT'S BUTT CREAM
TOPICAL_CREAM | Freq: Two times a day (BID) | CUTANEOUS | Status: DC
  Administered 2023-08-19 – 2023-09-01 (×4): 1 via TOPICAL
  Filled 2023-08-16 (×2): qty 60

## 2023-08-16 MED ORDER — BACLOFEN 10 MG PO TABS
10.0000 mg | ORAL_TABLET | Freq: Three times a day (TID) | ORAL | Status: DC | PRN
  Administered 2023-08-16 – 2023-08-19 (×3): 10 mg via ORAL
  Filled 2023-08-16 (×3): qty 1

## 2023-08-16 MED ORDER — PROPRANOLOL HCL 10 MG PO TABS
10.0000 mg | ORAL_TABLET | Freq: Two times a day (BID) | ORAL | Status: DC | PRN
  Administered 2023-08-24: 10 mg via ORAL
  Filled 2023-08-16 (×2): qty 1

## 2023-08-16 MED ORDER — ORAL CARE MOUTH RINSE
15.0000 mL | OROMUCOSAL | Status: DC
  Administered 2023-08-16 – 2023-09-01 (×56): 15 mL via OROMUCOSAL

## 2023-08-16 MED ORDER — PREGABALIN 75 MG PO CAPS
75.0000 mg | ORAL_CAPSULE | Freq: Two times a day (BID) | ORAL | Status: DC
  Administered 2023-08-16 – 2023-08-21 (×11): 75 mg via ORAL
  Filled 2023-08-16 (×11): qty 1

## 2023-08-16 MED ORDER — POTASSIUM CHLORIDE CRYS ER 20 MEQ PO TBCR
20.0000 meq | EXTENDED_RELEASE_TABLET | Freq: Two times a day (BID) | ORAL | Status: DC
  Administered 2023-08-16 – 2023-08-24 (×17): 20 meq via ORAL
  Filled 2023-08-16 (×17): qty 1

## 2023-08-16 MED ORDER — LEVOTHYROXINE SODIUM 100 MCG PO TABS
200.0000 ug | ORAL_TABLET | Freq: Every day | ORAL | Status: DC
  Administered 2023-08-16 – 2023-09-01 (×17): 200 ug via ORAL
  Filled 2023-08-16 (×17): qty 2

## 2023-08-16 MED ORDER — CITALOPRAM HYDROBROMIDE 20 MG PO TABS
20.0000 mg | ORAL_TABLET | Freq: Every day | ORAL | Status: DC
  Administered 2023-08-16 – 2023-09-01 (×16): 20 mg via ORAL
  Filled 2023-08-16 (×16): qty 1

## 2023-08-16 MED ORDER — IPRATROPIUM-ALBUTEROL 0.5-2.5 (3) MG/3ML IN SOLN
3.0000 mL | Freq: Four times a day (QID) | RESPIRATORY_TRACT | Status: DC | PRN
  Administered 2023-08-18 – 2023-08-26 (×3): 3 mL via RESPIRATORY_TRACT
  Filled 2023-08-16 (×4): qty 3

## 2023-08-16 MED ORDER — IPRATROPIUM-ALBUTEROL 0.5-2.5 (3) MG/3ML IN SOLN: 3.0000 mL | Freq: Four times a day (QID) | RESPIRATORY_TRACT | Status: DC

## 2023-08-16 MED ORDER — FUROSEMIDE 10 MG/ML IJ SOLN
40.0000 mg | Freq: Two times a day (BID) | INTRAMUSCULAR | Status: DC
  Administered 2023-08-16 – 2023-08-24 (×18): 40 mg via INTRAVENOUS
  Filled 2023-08-16 (×18): qty 4

## 2023-08-16 NOTE — Progress Notes (Signed)
 73 yo female with multiple comorbidities lives at home with her husband and 47 year old granddaughter.  Past medical history fluids morbid obesity obstructive sleep apnea restrictive lung disease diastolic dysfunction hypothyroidism chronic pain mood disorder chronic bilateral lower extremity edema, unsteady gait at baseline she walks with a walker from bed to chair or chair to bed and may be to the restroom at home she has bilateral chronic lymphedema. She was admitted with hypoxia and hypercapnia. Patient admitted with increased somnolence hypercapnia and hypoxia.  Was 76% on 3 L.  pCO2 78. Chest x-ray showed basilar atelectasis right greater than left. BNP 821 7.42/78/less than 31 TSH 11.187 Plan for BiPAP as needed incentive spirometry to be encouraged Taper O2 as tolerated Check free T4 on Synthroid  200 mcg at home Echo in 2020 with 50 to 55% EF grade 1 diastolic dysfunction Repeat echo pending Continue diuresis with Lasix  40 twice daily follow-up renal functions and potassium levels in a.m. Consult toc and pt

## 2023-08-16 NOTE — Consult Note (Addendum)
 WOC Nurse Consult Note: patient with longstanding history of lymphedema (since teenager), was last seen in wound care center 12/2022 for wounds to legs no follow-up seen for legs since that time on chart review; per [facility] note she has lymphedema pumps at home  Reason for Consult: lymphedema and sacral pressure injury  Wound type: 1.  Full thickness lower legs r/t lymphedema  2.  Buttocks with evidence of chronic pressure and moisture associated skin damage  3.  Intertriginous dermatitis underneath pannus  ICD-10 CM Codes for Irritant Dermatitis  L30.4  - Erythema intertrigo. Also used for abrasion of the hand, chafing of the skin, dermatitis due to sweating and friction, friction dermatitis, friction eczema, and genital/thigh intertrigo.  Pressure Injury POA: NA  Measurement: see nursing flowsheet  Wound bed: open area pink moist L calf, otherwise legs appear with large amount of dry hyperkeratotic skin Buttocks and underneath pannus with erythema and scattered partial thickness skin loss  Drainage (amount, consistency, odor) per nursing flowsheet  Periwound: buttocks/posterior thighs noted to have dark dusky discoloration consistent with chronic pressure to the point of ? Deep Tissue Pressure Injury; legs with edema r/t lymphedema  Dressing procedure/placement/frequency:  Cleanse B feet and lower legs with soap and water, buffy lightly with washcloth to remove any dry dead skin.  Apply Xeroform gauze Timm Foot [phone]) to any open wound bed then apply Eucerin to dorsal feet and legs 2 times daily.  May secure Xeroform with ABD pad and Kerlix roll gauze as needed Cleanse underneath pannus/abdominal folds with soap and water, dry and apply Interdry AG as follows: Order Timm Foot # [phone] Measure and cut length of InterDry to fit in skin folds that have skin breakdown Tuck InterDry fabric into skin folds in a single layer, allow for 2 inches of overhang from skin edges to allow for wicking to occur May remove  to bathe; dry area thoroughly and then tuck into affected areas again Do not apply any creams or ointments when using InterDry DO NOT THROW AWAY FOR 5 DAYS unless soiled with stool DO NOT [facility] product, this will inactivate the silver in the material  New sheet of Interdry should be applied after 5 days of use if patient continues to have skin breakdown   Cleanse buttocks/posterior thighs with soap and water, dry and apply Gerhardt's Butt Cream 2 times daily and prn soiling.   Patient would benefit from a low air loss mattress for pressure redistribution and moisture management.    POC discussed with bedside nurse. WOC team will not follow. Re-consult if further needs arise.   Patient would benefit from ongoing care at wound care center or lymphedema clinic.    Lymphedema  Resources (updated 01/2021) Each site requires a referral from your primary care MD [facility] [address] [place], [place]  [phone] (Upper extremities)  [address] [place], [place] [phone] (Lower extremities, PATIENT CAN NOT HAVE A WOUND)  [facility] [address] [place], [place] [address] [phone]  [facility] [address], [address]  [place], [place] [phone]  [facility] [address] [place], [place] [address] [phone]  [facility]  (only treatment for lymphedema related to cancer diagnosis) [address]  [place], [place] [address] [phone]    [facility] [address] [place], [place] [address] [phone] [facility] (formerly  [facility]) [address] [place], [place] [address] [phone]     Thank you,    Ronni Colace MSN, RN-BC, [employer] [phone]

## 2023-08-16 NOTE — Progress Notes (Signed)
   08/16/23 2327  BiPAP/CPAP/SIPAP  BiPAP/CPAP/SIPAP Pt Type Adult  BiPAP/CPAP/SIPAP DREAMSTATIOND  Mask Type Full face mask  Dentures removed? Not applicable  Mask Size Medium  Pressure Support 8 cmH20  Flow Rate 7 lpm  Patient Home Machine No  Patient Home Mask No  Patient Home Tubing No  Auto Titrate Yes  Minimum cmH2O 8 cmH2O  Maximum cmH2O 25 cmH2O  Device Plugged into RED Power Outlet Yes  BiPAP/CPAP /SiPAP Vitals  Pulse Rate 81  Resp (!) 23  SpO2 91 %  Bilateral Breath Sounds Diminished  MEWS Score/Color  MEWS Score 1  MEWS Score Color Green

## 2023-08-16 NOTE — Progress Notes (Signed)
   08/16/23 0138  BiPAP/CPAP/SIPAP  $ Non-Invasive Home Ventilator  Initial  $ Face Mask Medium Yes  BiPAP/CPAP/SIPAP Pt Type Adult  BiPAP/CPAP/SIPAP DREAMSTATIOND  Mask Type Full face mask  Dentures removed? Not applicable  Mask Size Medium  Pressure Support 8 cmH20  Flow Rate 8 lpm  Patient Home Machine No  Patient Home Mask No  Patient Home Tubing No  Auto Titrate Yes (Imax 25 Emin 8  PS 8)  Minimum cmH2O 8 cmH2O  Maximum cmH2O 25 cmH2O  Device Plugged into RED Power Outlet Yes  BiPAP/CPAP /SiPAP Vitals  Pulse Rate 72  Resp 18  SpO2 100 %  Bilateral Breath Sounds Diminished  MEWS Score/Color  MEWS Score 0  MEWS Score Color Marrie Sizer

## 2023-08-16 NOTE — H&P (Signed)
 History and Physical    Morgan Odonnell QIH:[id number] DOB: 1950/05/25 DOA: 08/15/2023  PCP: Bertha Broad, MD   Patient coming from: Home   Chief Complaint:  Chief Complaint  Patient presents with   Weakness    HPI:  Morgan Odonnell is a 73 y.o. female with hx of OHS/OSA, CHRF on 3 L O2, restrictive lung disease secondary to obesity, morbid obesity, HFpEF, hypothyroidism, mood disorder, chronic pain, who presents with concern for increased somnolence at home.  At time of interview patient is awake alert and fully oriented.  She reports that her husband has been more concerned because over the past few days she has been sleeping more often.  She reports that she has had worsening shortness of breath, chest pressure at rest, cough productive with white sputum over the past week.  No fever, chills.  She has chronic lymphedema thinks that her legs have become more swollen.  She has chronic orthopnea.  She has previously been intolerant of noninvasive ventilation with sleep and does not use at home.   Review of Systems:  ROS complete and negative except as marked above   Allergies  Allergen Reactions   Gabapentin     "slept for 2 days"   Novocain [Procaine] Other (See Comments)    intolerance    Prior to Admission medications   Medication Sig Start Date End Date Taking? Authorizing Provider  cyclobenzaprine (FEXMID) 7.5 MG tablet Take 7.5 mg by mouth 3 (three) times daily. 07/15/23  Yes [provider]  fluconazole (DIFLUCAN) 150 MG tablet Take 150 mg by mouth daily. 05/19/23  Yes [provider]  naloxone  (NARCAN ) nasal spray 4 mg/0.1 mL Place 1 spray into the nose once. 07/15/23  Yes [provider]  pregabalin  (LYRICA ) 75 MG capsule Take 75 mg by mouth every 12 (twelve) hours. 07/15/23  Yes [provider]  propranolol (INDERAL) 10 MG tablet Take 10 mg by mouth 2 (two) times daily as needed. 07/04/23  Yes [provider]   sulfamethoxazole-trimethoprim (BACTRIM DS) 800-160 MG tablet Take 1 tablet by mouth 2 (two) times daily. 02/19/23  Yes [provider]  albuterol  (PROVENTIL  HFA;VENTOLIN  HFA) 108 (90 BASE) MCG/ACT inhaler Inhale 2 puffs into the lungs every 6 (six) hours as needed for wheezing.     [provider]  aspirin  EC 81 MG EC tablet Take 1 tablet (81 mg total) by mouth daily. 07/05/17   Daren Eck, DO  baclofen (LIORESAL) 10 MG tablet Take 1 tablet by mouth 4 (four) times daily as needed for muscle spasms.     [provider]  budesonide -formoterol  (SYMBICORT ) 160-4.5 MCG/ACT inhaler INHALE TWO puffs into lung TWICE DAILY 07/09/22   Hunsucker, Archer Kobs, MD  celecoxib (CELEBREX) 200 MG capsule Take 200 mg by mouth daily.    [provider]  citalopram  (CELEXA ) 20 MG tablet Take 1 tablet (20 mg total) by mouth daily. 07/04/17   Daren Eck, DO  diclofenac  sodium (VOLTAREN ) 1 % GEL Apply 1 application topically 4 (four) times daily. Applied to knees, shoulders, wrists, and ankles    [provider]  furosemide  (LASIX ) 40 MG tablet 2 tablets (80mg ) by mouth two times a day Patient taking differently: 2 tablets (80mg ) by mouth two times a day  Takes 2 tables in morning and 1 tab in evening 08/18/15   McLean, Dalton S, MD  levothyroxine  (SYNTHROID ) 200 MCG tablet Take 1 tablet (200 mcg total) by mouth daily before breakfast. 03/24/19  Jonn Nett, DO  levothyroxine  (SYNTHROID ) 25 MCG tablet Take 1 tab WITH LEVOTHYROXINE  200 MCG on Monday, Wednesday, and Friday. 10/26/19   Jonn Nett, DO  metoprolol  tartrate (LOPRESSOR ) 25 MG tablet Take 0.5 tablets (12.5 mg total) by mouth 2 (two) times daily. Patient taking differently: Take 12.5 mg by mouth daily. 07/04/17   Daren Eck, DO  nicotine  polacrilex (NICORETTE ) 4 MG gum Take 4 mg by mouth as needed for smoking cessation.    [provider]  oxyCODONE-acetaminophen  (PERCOCET/ROXICET) 5-325 MG tablet  Take by mouth every 4 (four) hours as needed for severe pain.    [provider]  potassium chloride  SA (K-DUR,KLOR-CON ) 20 MEQ tablet 2 tablets (40 mEq) by mouth in the AM and 1 tablet (20 mEq) by mouth in the PM 10/09/16   Darlis Eisenmenger, MD  pregabalin  (LYRICA ) 100 MG capsule Take 1 capsule (100 mg total) by mouth 2 (two) times daily. Patient taking differently: Take 50 mg by mouth 2 (two) times daily. 07/04/17   Daren Eck, DO  pregabalin  (LYRICA ) 50 MG capsule 1 capsule Orally Twice a day 30 days 12/15/19   [provider]  Spacer/Aero-Holding Chambers (AEROCHAMBER Z-STAT PLUS [facility]) MISC Use as directed 05/10/15   Nestor, Jennings E, MD  tiotropium (SPIRIVA  HANDIHALER) 18 MCG inhalation capsule Place 1 capsule (18 mcg total) into inhaler and inhale daily. 07/09/22   Hunsucker, Archer Kobs, MD  Vitamin D , Ergocalciferol , (DRISDOL ) 1.25 MG (50000 UNIT) CAPS capsule Take 1 capsule (50,000 Units total) by mouth every 7 (seven) days. 05/10/19   Jonn Nett, DO    Past Medical History:  Diagnosis Date   Allergies    Anemia    Back pain    Bilateral swelling of feet    CHF (congestive heart failure) (HCC)    Chronic cough    COPD (chronic obstructive pulmonary disease) (HCC)    Depression    GERD (gastroesophageal reflux disease)    Heart murmur    Hypertension    Hypocapnia    Hypothyroid    Knee pain    Leg edema    chronic, bilateral   Lightheaded    Low back pain    Morbid obesity (HCC)    Neck pain    Obesity hypoventilation syndrome (HCC)    Oral lesion    Shortness of breath    Shortness of breath    Shoulder pain    Snoring     Past Surgical History:  Procedure Laterality Date   APPENDECTOMY     CHOLECYSTECTOMY     COLONOSCOPY N/A 06/25/2013   Procedure: COLONOSCOPY;  Surgeon: Almeda Aris, MD;  Location: [facility];  Service: Endoscopy;  Laterality: N/A;   ECTOPIC PREGNANCY SURGERY     Left Ovary Removed     MENISCUS REPAIR      TONSILLECTOMY     TUBAL LIGATION       reports that she quit smoking about 8 years ago. Her smoking use included cigarettes. She started smoking about 53 years ago. She has a 90 pack-year smoking history. She has never used smokeless tobacco. She reports that she does not drink alcohol and does not use drugs.  Family History  Problem Relation Age of Onset   Ovarian cancer Mother    Lung disease Mother    Heart failure Mother    Hypertension Mother    Heart disease Mother    Seizures Mother    Cancer Mother    Depression Mother  Sleep apnea Mother    Alcoholism Mother    Eating disorder Mother    Obesity Mother    Hypertension Father    Eating disorder Father    Obesity Father    Anxiety disorder Father    Lymphoma Brother    Ovarian cancer Maternal Aunt    Asthma Daughter      Physical Exam: Vitals:   08/15/23 2245 08/15/23 2300 08/15/23 2302 08/16/23 0000  BP: (!) 140/67   (!) 131/49  Pulse: 68   70  Resp:  18  (!) 22  Temp:   98.4 F (36.9 C) 98 F (36.7 C)  TempSrc:   Oral Oral  SpO2: 100%   95%    Gen: Awake, alert, chronically ill-appearing CV: Regular, normal S1, S2, 1/6 SEM at the left lower sternal border Resp: Normal WOB, poor expansion, on nasal cannula, diminished breath sounds at the bases, coarse  Abd: Obese, normoactive, nontender MSK: Symmetric, there is stage 4 lymphedema with chronic skin changes, superficial ulceration at the L anterior shin. Healed ulcer on the R higher up. Appears to have 1-2+ pitting edema which extends up thigh in dependent fashion  Skin: See MSK for more findings Neuro: Alert and interactive, fully oriented Psych: euthymic, appropriate    Data review:   Labs reviewed, notable for:   Bicarb 38 BNP 821  Hemoglobin 9 - last 13   Micro:  Results for orders placed or performed during the hospital encounter of 06/26/17  MRSA PCR Screening     Status: Abnormal   Collection Time: 06/27/17  8:29 AM   Specimen: Nasal  Mucosa; Nasopharyngeal  Result Value Ref Range Status   MRSA by PCR POSITIVE (A) NEGATIVE Final    Comment:        The GeneXpert MRSA Assay (FDA approved for NASAL specimens only), is one component of a comprehensive MRSA colonization surveillance program. It is not intended to diagnose MRSA infection nor to guide or monitor treatment for MRSA infections. RESULT CALLED TO, READ BACK BY AND VERIFIED WITH: H. D'Adano RN 10:35 06/27/17 ([email or web address]) Performed at [facility], [address] [place], [place] [address]     Imaging reviewed:  [facility] Chest Port 1 View Result Date: 08/15/2023 CLINICAL DATA:  Altered mental status and weakness EXAM: PORTABLE CHEST 1 VIEW COMPARISON:  09/2019 FINDINGS: Cardiac shadow remains enlarged. Aortic calcifications are noted. Lungs are well aerated bilaterally. Patchy atelectatic changes are noted in the bases right greater than left. No bony abnormality is noted. IMPRESSION: Basilar atelectasis right greater than left. Electronically Signed   By: Violeta Grey M.D.   On: 08/15/2023 21:05    EKG:  Personally reviewed sinus rhythm, 1st degree AV block, borderline RAD, no acute ischemic changes.  ED Course:  Treated with Lasix  40 mg IV   Assessment/Plan:  73 y.o. female with hx OHS/OSA, CHRF on 3 L O2, restrictive lung disease secondary to obesity, morbid obesity, HFpEF, hypothyroidism, mood disorder, chronic pain, who presents with concern for increased somnolence at home.  Admitted with acute on chronic hypoxic and hypercapnic respiratory failure and HFpEF exacerbation  Increased somnolence Acute on chronic hypoxic and hypercapnic respiratory failure related to OHS/OSA, restrictive lung disease.  Presenting with few days of increased somnolence at home.  On initial evaluation she is completely awake and alert., oriented.  Desat to 76% on 3L upon transfer to floor and currently on 7L midflow. VBG is pending.  Chronic compensatory alkalosis with hco3  slightly higher  at 10.  Suspect hypoxia related to component of pulmonary edema from heart failure.  Somnolence likely indicating worsening hypercapnia especially during sleep as she has been intolerant to NIV at home. Also may have component of sedating meds contributing (baclofen, cyclobenzaprine, oxycodone)  - Check VBG, flu/COVID/RSV - BiPAP nightly while inpatient - Management of heart failure per below - Continue home Symbicort  equivalent, Spiriva  equivalent, DuoNeb as needed, I-S, flutter valve - Wean O2 as able sat goal ~90 - 92%   HFpEF exacerbation Remote echo 12/' 20 with LVEF 50 to 55% and grade 1 diastolic dysfunction.  Currently taking Lasix  40 mg BID at home.  Exam with chronic lymphedema but does have pitting edema extending up to the thighs.  BNP 821 up from prior.  Chest x-ray with bibasilar atelectasis -S/p Lasix  40 mg IV in the ED.   Tentatively scheduled for 40 mg IV twice daily starting in the morning, adjust as needed. - Repeat TTE - Heart failure navigator/TOC consult - Low-sodium diet, I/O, daily weights.   Anemia, normocytic, new onset Last labs prior to admission was in 4/'24 with hemoglobin of 13.3.  On admission hemoglobin is 9.9 normocytic, hypochromic, wide RDW.  No history of bleeding sites. -Check iron panel, B12, folate, reticulocytes  Chronic medical problems: Morbid obesity: Follow-up with weight loss clinic outpatient Hypothyroidism: Continue home levothyroxine , check TSH Mood disorder: Continue home citalopram  Chronic pain: Hold home cyclobenzaprine.  Continue home Lyrica .  Cautiously will continue home dosing of baclofen 10 mg 3 times daily as needed spasm, Percocet 5/325 mg 3 times daily as needed for severe; reduce if worsening somnolence despite control of hypercapnia  There is no height or weight on file to calculate BMI.    DVT prophylaxis:  SCDs Code Status:  Full Code Diet:  Diet Orders (From admission, onward)     Start     Ordered    08/15/23 2313  Diet 2 gram sodium Room service appropriate? Yes; Fluid consistency: Thin  Diet effective now       Question Answer Comment  Room service appropriate? Yes   Fluid consistency: Thin      08/15/23 2313           Family Communication:  None   Consults:  None   Admission status:   Inpatient, Telemetry bed  Severity of Illness: The appropriate patient status for this patient is INPATIENT. Inpatient status is judged to be reasonable and necessary in order to provide the required intensity of service to ensure the patient's safety. The patient's presenting symptoms, physical exam findings, and initial radiographic and laboratory data in the context of their chronic comorbidities is felt to place them at high risk for further clinical deterioration. Furthermore, it is not anticipated that the patient will be medically stable for discharge from the hospital within 2 midnights of admission.   * I certify that at the point of admission it is my clinical judgment that the patient will require inpatient hospital care spanning beyond 2 midnights from the point of admission due to high intensity of service, high risk for further deterioration and high frequency of surveillance required.*   Arnulfo Larch, MD [facility]  How to contact the [facility] Attending or Consulting provider 7A - 7P or covering provider during after hours 7P -7A, for this patient.  Check the care team in [facility] and look for a) attending/consulting [facility] provider listed and b) the [facility] team listed Log into [email or web address] and use [facility]'s universal password to  access. If you do not have the password, please contact the hospital operator. Locate the [facility] provider you are looking for under [facility] and page to a number that you can be directly reached. If you still have difficulty reaching the provider, please page the [facility] (Director on Call) for the Hospitalists listed on amion for assistance.  08/16/2023,  12:27 AM

## 2023-08-17 DIAGNOSIS — I5033 Acute on chronic diastolic (congestive) heart failure: Secondary | ICD-10-CM | POA: Diagnosis not present

## 2023-08-17 LAB — BASIC METABOLIC PANEL WITH GFR
Anion gap: 7 (ref 5–15)
Anion gap: 6 (ref 5–15)
BUN: 11 mg/dL (ref 8–23)
BUN: 11 mg/dL (ref 8–23)
CO2: 36 mmol/L — ABNORMAL HIGH (ref 22–32)
CO2: 37 mmol/L — ABNORMAL HIGH (ref 22–32)
Calcium: 8.5 mg/dL — ABNORMAL LOW (ref 8.9–10.3)
Calcium: 8.4 mg/dL — ABNORMAL LOW (ref 8.9–10.3)
Chloride: 92 mmol/L — ABNORMAL LOW (ref 98–111)
Chloride: 92 mmol/L — ABNORMAL LOW (ref 98–111)
Creatinine, Ser: 0.55 mg/dL (ref 0.44–1.00)
Creatinine, Ser: 0.44 mg/dL (ref 0.44–1.00)
GFR, Estimated: >60 mL/min (ref >60)
GFR, Estimated: >60 mL/min (ref >60)
Glucose, Bld: 115 mg/dL — ABNORMAL HIGH (ref 70–99)
Glucose, Bld: 106 mg/dL — ABNORMAL HIGH (ref 70–99)
Potassium: 3.7 mmol/L (ref 3.5–5.1)
Potassium: 4.7 mmol/L (ref 3.5–5.1)
Sodium: 135 mmol/L (ref 135–145)
Sodium: 135 mmol/L (ref 135–145)

## 2023-08-17 LAB — MAGNESIUM: Magnesium: 2 mg/dL (ref 1.7–2.4)

## 2023-08-17 MED ORDER — FERROUS SULFATE 325 (65 FE) MG PO TABS
325.0000 mg | ORAL_TABLET | Freq: Two times a day (BID) | ORAL | Status: DC
  Administered 2023-08-17 – 2023-09-01 (×29): 325 mg via ORAL
  Filled 2023-08-17 (×29): qty 1

## 2023-08-17 NOTE — Evaluation (Signed)
 Physical Therapy Evaluation Patient Details Name: Morgan Odonnell MRN: [id number] DOB: 07/01/1950 Today's Date: 08/17/2023  History of Present Illness  73 year old female admitted on 08/15/23 for increased somnolence with acute on chronic hypoxic and hypercapnic respiratory failure. PMHx: OHS/OSA, CHRF on 3 L O2, restrictive lung disease secondary to obesity, morbid obesity, HFpEF, hypothyroidism, mood disorder, chronic pain, lymphedema, CHF, hypoventilation syndrome, heart murmur  Clinical Impression  Pt admitted with above diagnosis.  Pt currently with functional limitations due to the deficits listed below (see PT Problem List). Pt will benefit from acute skilled PT to increase their independence and safety with mobility to allow discharge.  Pt agreeable for OOB.  Spouse, Morgan Odonnell, arrived at beginning of session and reports pt's LEs have less edema and pt's cognition much better, close to baseline.  Pt assist with standing and transfer OOB to recliner.  Bed linen and mattress soaked with urine (RN notified) so bed cleaned and left to dry while pt up in recliner.  Pt typically on 3-4L O2 Morgan Odonnell at home however currently on 7L HFNC.  Pt requested breathing treatment initially however once in recliner, pt declined need.  Spouse feels pt is able to return home at d/c and agreeable for HHPT.        If plan is discharge home, recommend the following: A little help with walking and/or transfers;A little help with bathing/dressing/bathroom;Assistance with cooking/housework;Assist for transportation;Help with stairs or ramp for entrance   Can travel by private vehicle        Equipment Recommendations None recommended by PT  Recommendations for Other Services       Functional Status Assessment Patient has had a recent decline in their functional status and demonstrates the ability to make significant improvements in function in a reasonable and predictable amount of time.     Precautions / Restrictions  Precautions Precautions: Fall Precaution/Restrictions Comments: currently on 7L HFNC (baseline 4L O2 at home)      Mobility  Bed Mobility Overal bed mobility: Needs Assistance Bed Mobility: Supine to Sit     Supine to sit: Max assist, +2 for physical assistance     General bed mobility comments: mostly required assist for bringing LEs over EOB; pt able to assist more with trunk upright and scooting to EOB; upon sitting, discovered urine soaked linen and mattress    Transfers Overall transfer level: Needs assistance Equipment used: Rolling walker (2 wheels) Transfers: Sit to/from Stand, Bed to chair/wheelchair/BSC Sit to Stand: Min assist, +2 safety/equipment   Step pivot transfers: Min assist, +2 safety/equipment       General transfer comment: pt kept hands on RW, light assist for support; remained on 7L HFNC; pt had requested breathing treatment at beginning of session however declined once sitting in recliner    Ambulation/Gait                  Stairs            Wheelchair Mobility     Tilt Bed    Modified Rankin (Stroke Patients Only)       Balance Overall balance assessment: Needs assistance Sitting-balance support: No upper extremity supported, Feet supported Sitting balance-Morgan Odonnell: Fair     Standing balance support: Bilateral upper extremity supported, During functional activity, Reliant on assistive device for balance Standing balance-Morgan Odonnell: Poor  Pertinent Vitals/Pain Pain Assessment Pain Assessment: Faces Faces Pain Odonnell: Hurts even more Pain Location: bil LEs with touch/assist Pain Descriptors / Indicators: Sore, Tender Pain Intervention(s): Repositioned, Monitored during session    Home Living Family/patient expects to be discharged to:: Private residence Living Arrangements: Spouse/significant other Available Help at Discharge: Family;Available 24 hours/day Type of Home:  House Home Access: Ramped entrance       Home Layout: One level Home Equipment: Agricultural consultant (2 wheels);Wheelchair - manual;Cane - single point      Prior Function Prior Level of Function : Independent/Modified Independent             Mobility Comments: ambulatory short distance only with RW       Extremity/Trunk Assessment        Lower Extremity Assessment Lower Extremity Assessment: Generalized weakness;RLE deficits/detail;LLE deficits/detail RLE Deficits / Details: bilateral chronic lymphedema, legs wrapped, spouse reports edema decreased since admission       Communication   Communication Communication: No apparent difficulties    Cognition Arousal: Alert Behavior During Therapy: WFL for tasks assessed/performed                           PT - Cognition Comments: spouse, Morgan Odonnell present and reports pt's cognition is close to baseline Following commands: Intact       Cueing       General Comments      Exercises     Assessment/Plan    PT Assessment Patient needs continued PT services  PT Problem List Decreased mobility;Decreased activity tolerance;Decreased strength;Cardiopulmonary status limiting activity;Obesity;Decreased skin integrity       PT Treatment Interventions DME instruction;Gait training;Balance training;Functional mobility training;Therapeutic activities;Therapeutic exercise;Patient/family education    PT Goals (Current goals can be found in the Care Plan section)  Acute Rehab PT Goals PT Goal Formulation: With patient/family Time For Goal Achievement: 08/31/23 Potential to Achieve Goals: Good    Frequency Min 3X/week     Co-evaluation               AM-PAC PT "6 Clicks" Mobility  Outcome Measure Help needed turning from your back to your side while in a flat bed without using bedrails?: A Lot Help needed moving from lying on your back to sitting on the side of a flat bed without using bedrails?: A Lot Help  needed moving to and from a bed to a chair (including a wheelchair)?: A Little Help needed standing up from a chair using your arms (e.g., wheelchair or bedside chair)?: A Little Help needed to walk in hospital room?: A Little Help needed climbing 3-5 steps with a railing? : A Lot 6 Click Score: 15    End of Session Equipment Utilized During Treatment: Gait belt Activity Tolerance: Patient limited by fatigue Patient left: in chair;with call bell/phone within reach;with family/visitor present Nurse Communication: Mobility status (RN aware pt in recliner end of session to allow bed to dry; RN agreed to check back shortly to assist pt back to bed and perform LE dressing changes; spouse present in room) PT Visit Diagnosis: Muscle weakness (generalized) (M62.81);Difficulty in walking, not elsewhere classified (R26.2)    Time: 1356-1430 PT Time Calculation (min) (ACUTE ONLY): 34 min   Charges:   PT Evaluation $PT Eval Moderate Complexity: 1 Mod PT Treatments $Therapeutic Activity: 8-22 mins PT General Charges $$ ACUTE PT VISIT: 1 Visit        Henretta Lodge PT, DPT Physical Therapist Acute Rehabilitation Services  Office: [phone]   Myna Asal Payson 08/17/2023, 3:34 PM

## 2023-08-17 NOTE — Hospital Course (Addendum)
 73 year old woman complex PMH including COPD, restrictive lung disease, OSA, OHS, morbid obesity presented with somnolence, shortness of breath.  Admitted for acute on chronic hypoxic and hypercapnic respiratory failure, acute on chronic diastolic CHF.  Noncompliant with mask/BiPAP, eventually decompensated again and was transferred to stepdown unit and seen by pulmonology.  Tolerating NIV.  Medically stable for transfer to SNF.  Consultants Pulmonology Nephrology  Palliative Medicine  Procedures/Events 6/6 - Admitted w/ acute on chronic diastolic HF, vol overload, hypoxic and hypercarbic resp failure  6/7 - Echo with LVEF 50 to 55%, low normal function; G1DD (impaired relaxation), elevated left atrial pressure, normal RV/RV function. 6/11 - PCCM consulted.  6/16 - Decreased responsiveness, brought back to SDU for closer monitoring. WBC spiked to 34, placed on cefepime  and azithro.

## 2023-08-17 NOTE — TOC Initial Note (Signed)
 Transition of Care [facility]) - Initial/Assessment Note    Patient Details  Name: Morgan Odonnell MRN: [id number] Date of Birth: [birth year]  Transition of Care [facility]) CM/SW Contact:    Amaryllis Junior, LCSW Phone Number: 08/17/2023, 9:32 AM  Clinical Narrative:                 Pt from home w/ children. PT eval pending. Pt continues medical workup. TOC following for dc needs.     Barriers to Discharge: Continued Medical Work up   Patient Goals and CMS Choice Patient states their goals for this hospitalization and ongoing recovery are:: return home   Choice offered to / list presented to : NA      Expected Discharge Plan and Services In-house Referral: NA     Living arrangements for the past 2 months: Single Family Home                 DME Arranged: N/A DME Agency: NA       HH Arranged: NA HH Agency: NA        Prior Living Arrangements/Services Living arrangements for the past 2 months: Single Family Home Lives with:: Adult Children Patient language and need for interpreter reviewed:: Yes Do you feel safe going back to the place where you live?: Yes      Need for Family Participation in Patient Care: Yes (Comment) Care giver support system in place?: Yes (comment)   Criminal Activity/Legal Involvement Pertinent to Current Situation/Hospitalization: No - Comment as needed  Activities of Daily Living   ADL Screening (condition at time of admission) Independently performs ADLs?: No Does the patient have a NEW difficulty with bathing/dressing/toileting/self-feeding that is expected to last >3 days?: No Does the patient have a NEW difficulty with getting in/out of bed, walking, or climbing stairs that is expected to last >3 days?: No Does the patient have a NEW difficulty with communication that is expected to last >3 days?: No Is the patient deaf or have difficulty hearing?: Yes Does the patient have difficulty seeing, even when wearing glasses/contacts?: No Does the  patient have difficulty concentrating, remembering, or making decisions?: No  Permission Sought/Granted                  Emotional Assessment Appearance:: Appears stated age Attitude/Demeanor/Rapport: Engaged Affect (typically observed): Accepting Orientation: : Oriented to  Time, Oriented to Place, Oriented to Self, Oriented to Situation Alcohol / Substance Use: Not Applicable Psych Involvement: No (comment)  Admission diagnosis:  Acute exacerbation of CHF (congestive heart failure) (HCC) [I50.9] Heart failure, unspecified HF chronicity, unspecified heart failure type (HCC) [I50.9] Patient Active Problem List   Diagnosis Date Noted   Normocytic anemia 08/16/2023   Heart failure (HCC) 08/15/2023   Precordial pain 02/21/2021   Exertional dyspnea 02/21/2021   Acute on chronic respiratory failure with hypoxia and hypercapnia (HCC) 06/27/2017   Hyperkalemia 06/27/2017   Acute renal failure superimposed on stage 2 chronic kidney disease (HCC) 06/27/2017   Restrictive lung disease 12/13/2015   Obesity hypoventilation syndrome (HCC) 05/10/2015   Chronic respiratory failure (HCC) 05/10/2015   Morbid obesity with BMI of 50.0-59.9, adult (HCC) 10/18/2013   Class 3 severe obesity with serious comorbidity and body mass index (BMI) of 60.0 to 69.9 in adult 10/18/2013   Lymphedema 01/26/2013   Chronic diastolic CHF (congestive heart failure) (HCC) 01/04/2013   Chronic cough    COPD (chronic obstructive pulmonary disease)Gold C    Snoring    Low back  pain    Hypertension    PCP:  Bertha Broad, MD Pharmacy:   [facility] - [place], [place] - [address] [place] [address] Phone: [phone] Fax: [phone]     Social Drivers of Health (SDOH) Social History: SDOH Screenings   Food Insecurity: No Food Insecurity (08/16/2023)  Housing: Low Risk  (08/16/2023)  Transportation Needs: No Transportation Needs (08/16/2023)  Utilities:  Not At Risk (08/16/2023)  Depression (PHQ2-9): Medium Risk (01/13/2019)  Social Connections: Moderately Integrated (08/16/2023)  Tobacco Use: Medium Risk (08/15/2023)   SDOH Interventions:     Readmission Risk Interventions    08/17/2023    9:31 AM  Readmission Risk Prevention Plan  Transportation Screening Complete  PCP or Specialist Appt within 5-7 Days Complete  Home Care Screening Complete  Medication Review (RN CM) Complete

## 2023-08-17 NOTE — Progress Notes (Signed)
   08/17/23 2346  BiPAP/CPAP/SIPAP  Reason BIPAP/CPAP not in use Other(comment) (on standby patient does not wish to wear)  BiPAP/CPAP /SiPAP Vitals  Resp (!) 22  MEWS Score/Color  MEWS Score 1  MEWS Score Color Green

## 2023-08-17 NOTE — Progress Notes (Signed)
 PROGRESS NOTE Morgan Odonnell  ZOX:[id number] DOB: [birth year] DOA: 08/15/2023 PCP: Bertha Broad, MD  Brief Narrative/Hospital Course: 53 YOF w/ hx of OHS/OSA, CHRF on 3 L O2, restrictive lung disease secondary to obesity, morbid obesity, HFpEF, hypothyroidism, mood disorder, chronic pain presented with concern for increased somnolence at home. At time of interview patient is awake alert and fully oriented.  She reports that her husband has been more concerned because over the past few days she has been sleeping more often.She reports that she has had worsening shortness of breath, chest pressure at rest, cough productive with white sputum over the past week.  No fever, chills.  She has chronic lymphedema thinks that her legs have become more swollen.  She has chronic orthopnea.  She has previously been intolerant of noninvasive ventilation with sleep and does not use at home. In the ED: Labs showed BNP 821 bicarb 31 hemoglobin 9 previously 13 g, chest x-ray with basilar atelectasis RT>LT. Blood gases pH 7.4 PCO278 pO2 less than 30 compensated picture Patient admitted for increased somnolence with acute on chronic hypoxic and hypercapnic respiratory failure. Placed on nasal cannula oxygen , IV diuretics  Subjective: Patient seen examined Overnight afebrile has been on 6 to 7  HFNC, used CPAP machine for few hours as per nursing staff. BP stable.  Labs showed mild hypokalemia normal B12 folate iron saturation low at 7 and iron level 70. She is alert and oriented x 3- No chest pain nausea vomiting fever chills  Assessment and plan:  Increased somnolence Acute on chronic hypoxic and hypercapnic respiratory - PTA on 3l [place]  OHS/OSA/Restrictive lung disease-no compliant with CPAP: At baseline or at at bedtime OSA and RLD, intolerant to noninvasive ventilation, admitted with increased somnolence acute on chronic hypoxia and hypercapnia.  pH appears compensated.  Mental status improved alert awake  oriented x 3. Has chronic lymphedema.  Viral etiology negative.  Not been able to use BiPAP here. Continue to address fluid overload CHF with IV diuretics.  Continue supplemental oxygen , PT OT, incentive spirometry.  Minimize psychotropic medication/sedatives. May need pulmonary eval at some point-she is followed by Dr. Marygrace Snellen, she may be willing to try CPAP this time will touch base with pulmonary. Cont on current bronchodilators    Acute on chronic HFpEF exacerbation: Previously with EF 15% half percent on Lasix  40 twice daily PTA and has chronic lymphedema . BNP 821 up from prior.  Chest x-ray with bibasilar atelectasis. Repeat echo: EF 50-50% no RWMA diastolic parameters indeterminate severe mitral annular calcification AV tricuspid Continue IV Lasix -has chronic lymphedema with hyperkeratotic skin changes. Cont to monitor daily I/O,weight, electrolytes and net balance as below. Cont salt/fluid restricted diet and monitor in tele. Net IO Since Admission: -1,830 mL [08/17/23 1023]  Filed Weights   08/16/23 0010 08/16/23 0500 08/17/23 0500  Weight: 133.6 kg 133.6 kg (!) 155.2 kg    Recent Labs  Lab 08/15/23 2036 08/16/23 0038 08/17/23 0745  BNP 821.9*  --   --   BUN 15 14 11   CREATININE 0.69 0.57 0.55  K 3.5 3.3* 3.7  MG  --  1.9 2.0    Anemia, normocytic, new onset: Last labs prior to admission was in 4/'24 with hemoglobin of 13.3.  Currently holding the 9 g range workup shows iron deficiency, check Hemoccult, will need close outpatient follow-up at this time and add iron supplementation Recent Labs  Lab 08/15/23 2036 08/16/23 0038  HGB 9.9* 9.9*  HCT 33.9* 33.5*   Hypothyroidism:  TSH TFT slightly abnormal will need close follow-up and recheck  Mood disorder: Continue home citalopram   Chronic pain: PTA on cyclobenzaprine, Lyrica , baclofen 1, percocet 5/325 mg  tid.  Continue home meds cautiously avoid sedation and hypercapnia   Super more obesity w/ Body mass index is  62.58 kg/m.: Will benefit with PCP follow-up, weight loss,healthy lifestyle and outpatient sleep eval if not done.  DVT prophylaxis: SCDs Start: 08/15/23 2312 Code Status:   Code Status: Full Code Family Communication: plan of care discussed with patient at bedside. Patient status is: Remains hospitalized because of severity of illness Level of care: Telemetry   Dispo: The patient is from: HOME W/ daughter.             Anticipated disposition: TBD.  Obtain PT OT evaluation  Objective: Vitals last 24 hrs: Vitals:   08/17/23 0049 08/17/23 0400 08/17/23 0500 08/17/23 0842  BP:  133/64    Pulse:  80    Resp: (!) 22 20    Temp:  98.5 F (36.9 C)    TempSrc:  Oral    SpO2:  91%  96%  Weight:   (!) 155.2 kg   Height:        Physical Examination: General exam: alert awake, older than stated age HEENT:Oral mucosa moist, Ear/Nose WNL grossly Respiratory system: Bilaterally diminished BS, no use of accessory muscle Cardiovascular system: S1 & S2 +. Gastrointestinal system: Abdomen soft, morbidly obese NTBS+ Nervous System: Alert, awake, oriented x 3 following commands. Extremities: LE edema +++ with hyperkeratotic skin changes and lymphedema Skin: No rashes,warm. MSK: Normal muscle bulk/tone.   Data Reviewed: I have personally reviewed following labs and imaging studies ( see epic result tab) CBC: Recent Labs  Lab 08/15/23 2036 08/16/23 0038  WBC 7.2 7.7  HGB 9.9* 9.9*  HCT 33.9* 33.5*  MCV 93.1 93.1  PLT 163 154   CMP: Recent Labs  Lab 08/15/23 2036 08/16/23 0038 08/17/23 0745  NA 136 137 135  K 3.5 3.3* 3.7  CL 91* 91* 92*  CO2 38* 39* 36*  GLUCOSE 126* 97 115*  BUN 15 14 11   CREATININE 0.69 0.57 0.55  CALCIUM  8.8* 8.8* 8.5*  MG  --  1.9 2.0  PHOS  --  3.0  --    GFR: Estimated Creatinine Clearance: 93.8 mL/min (by C-G formula based on SCr of 0.55 mg/dL). Recent Labs  Lab 08/15/23 2036  AST 23  ALT 11  ALKPHOS 79  BILITOT 0.8  PROT 8.3*  ALBUMIN 2.6*    No results for input(s): "LIPASE", "AMYLASE" in the last 168 hours. No results for input(s): "AMMONIA" in the last 168 hours. Coagulation Profile: No results for input(s): "INR", "PROTIME" in the last 168 hours. Unresulted Labs (From admission, onward)     Start     Ordered   08/18/23 0500  CBC  Daily,   R      08/17/23 0739   08/18/23 0500  Basic metabolic panel with GFR  Daily,   R      08/17/23 0801   08/17/23 0802  Basic metabolic panel  ONCE - URGENT,   URGENT        08/17/23 0801           Antimicrobials/Microbiology: Anti-infectives (From admission, onward)    None      No results found for: "SDES", "SPECREQUEST", "CULT", "REPTSTATUS"  Procedures:  Medications reviewed:  Scheduled Meds:  citalopram   20 mg Oral Daily   ferrous sulfate  325 mg Oral BID WC   fluticasone  furoate-vilanterol  1 puff Inhalation Daily   furosemide   40 mg Intravenous BID   Gerhardt's butt cream   Topical BID   hydrocerin   Topical BID   levothyroxine   200 mcg Oral Q0600   nicotine   21 mg Transdermal Daily   mouth rinse  15 mL Mouth Rinse 4 times per day   potassium chloride   20 mEq Oral BID   pregabalin   75 mg Oral BID   sodium chloride  flush  3 mL Intravenous Q12H   umeclidinium bromide   1 puff Inhalation Daily   Continuous Infusions:  Lesa Rape, MD [facility] 08/17/2023, 10:23 AM

## 2023-08-18 DIAGNOSIS — I5033 Acute on chronic diastolic (congestive) heart failure: Secondary | ICD-10-CM | POA: Diagnosis not present

## 2023-08-18 LAB — BASIC METABOLIC PANEL WITH GFR
Anion gap: 8 (ref 5–15)
BUN: 12 mg/dL (ref 8–23)
CO2: 37 mmol/L — ABNORMAL HIGH (ref 22–32)
Calcium: 8.5 mg/dL — ABNORMAL LOW (ref 8.9–10.3)
Chloride: 90 mmol/L — ABNORMAL LOW (ref 98–111)
Creatinine, Ser: 0.49 mg/dL (ref 0.44–1.00)
GFR, Estimated: >60 mL/min (ref >60)
Glucose, Bld: 87 mg/dL (ref 70–99)
Potassium: 3.7 mmol/L (ref 3.5–5.1)
Sodium: 135 mmol/L (ref 135–145)

## 2023-08-18 LAB — CBC
HCT: 31.1 % — ABNORMAL LOW (ref 36.0–46.0)
Hemoglobin: 9 g/dL — ABNORMAL LOW (ref 12.0–15.0)
MCH: 27.2 pg (ref 26.0–34.0)
MCHC: 28.9 g/dL — ABNORMAL LOW (ref 30.0–36.0)
MCV: 94 fL (ref 80.0–100.0)
Platelets: 140 10*3/uL — ABNORMAL LOW (ref 150–400)
RBC: 3.31 MIL/uL — ABNORMAL LOW (ref 3.87–5.11)
RDW: 16.8 % — ABNORMAL HIGH (ref 11.5–15.5)
WBC: 7.9 10*3/uL (ref 4.0–10.5)
nRBC: 0 % (ref 0.0–0.2)

## 2023-08-18 LAB — BLOOD GAS, ARTERIAL
Acid-Base Excess: 20.2 mmol/L — ABNORMAL HIGH (ref 0.0–2.0)
Bicarbonate: 46.9 mmol/L — ABNORMAL HIGH (ref 20.0–28.0)
Drawn by: 72603
O2 Content: 7 L/min
O2 Saturation: 98.8 %
Patient temperature: 36.5
pCO2 arterial: 65 mmHg — ABNORMAL HIGH (ref 32–48)
pH, Arterial: 7.47 — ABNORMAL HIGH (ref 7.35–7.45)
pO2, Arterial: 84 mmHg (ref 83–108)

## 2023-08-18 MED ORDER — CHLORHEXIDINE GLUCONATE CLOTH 2 % EX PADS
6.0000 | MEDICATED_PAD | Freq: Every day | CUTANEOUS | Status: DC
  Administered 2023-08-18 – 2023-08-19 (×2): 6 via TOPICAL

## 2023-08-18 MED ORDER — MUPIROCIN 2 % EX OINT
1.0000 | TOPICAL_OINTMENT | Freq: Two times a day (BID) | CUTANEOUS | Status: AC
  Administered 2023-08-18 – 2023-08-22 (×10): 1 via NASAL
  Filled 2023-08-18: qty 22

## 2023-08-18 MED ORDER — METOLAZONE 5 MG PO TABS
5.0000 mg | ORAL_TABLET | Freq: Once | ORAL | Status: AC
  Administered 2023-08-18: 5 mg via ORAL
  Filled 2023-08-18: qty 1

## 2023-08-18 NOTE — Evaluation (Signed)
 Occupational Therapy Evaluation Patient Details Name: Morgan Odonnell MRN: [id number] DOB: [birth year] Today's Date: 08/18/2023   History of Present Illness   73 year old female admitted on 08/15/23 for increased somnolence with acute on chronic hypoxic and hypercapnic respiratory failure. PMHx: OHS/OSA, CHRF on 3 L O2, restrictive lung disease secondary to obesity, morbid obesity, HFpEF, hypothyroidism, mood disorder, chronic pain, lymphedema, CHF, hypoventilation syndrome, heart murmur     Clinical Impressions PTA, patient lives with husband and had been limited to sleeping in recliner with short distance amb and transfers to commode due to decline in function, B LE edema and O2 dependent. Currently, patient presents with deficits outlined below (see OT Problem List for details) most significantly pain, significant DOE requiring O2 bumped from 7 ltrs to 10 ltrs (nursing present), LE pain, wounds, body habitus, balance deficits, and poor activity tolerance with -cardio/pulm response to just EOB sitting and generalized weakness impacting BADL's and mobility. Patient requires continued skilled OT in Acute hospital setting to progress functional performance and allow for discharge. Considering safety for her caregiver husband as she will not be able to mobilize safely in home for [facility] at this point yet therefore, patient will benefit from continued inpatient follow up therapy, <3 hours/day.      If plan is discharge home, recommend the following:   Two people to help with walking and/or transfers;Two people to help with bathing/dressing/bathroom;Assistance with cooking/housework;Direct supervision/assist for medications management;Direct supervision/assist for financial management;Assist for transportation;Help with stairs or ramp for entrance     Functional Status Assessment   Patient has had a recent decline in their functional status and demonstrates the ability to make significant improvements  in function in a reasonable and predictable amount of time.     Equipment Recommendations   BSC/3in1;Hospital bed;Other (comment) (if home: needs bari commode and excheange of regular hospitalbed for fitting bari bed)      Precautions/Restrictions   Precautions Precautions: Fall Precaution/Restrictions Comments: currently on 7L HFNC (baseline 4L O2 at home), significant DOE, watch SpO2 and has B LE wounds wrapped Restrictions Weight Bearing Restrictions Per Provider Order: No     Mobility Bed Mobility Overal bed mobility: Needs Assistance Bed Mobility: Supine to Sit     Supine to sit: Max assist, +2 for physical assistance     General bed mobility comments: urine saturated bed pads with patient with significant DOE with rolling and supine to sit EOB, max A for trunk and LE manageement back into bed    Transfers Overall transfer level:  (patient moved EOB, needed O2 bumped from 7 ltrs to 10 ltrs to maintain O2 sats >85%, RR 28-34 and unable to tolerate STS with nursing husband and OT assist)                        Balance Overall balance assessment: Needs assistance Sitting-balance support: Single extremity supported Sitting balance-Leahy Scale: Fair     Standing balance support:  (unable to attempt STS due to SpO2 and DOE)                               ADL either performed or assessed with clinical judgement   ADL Overall ADL's : Needs assistance/impaired Eating/Feeding: Set up;Bed level   Grooming: Wash/dry hands;Wash/dry face;Minimal assistance;Sitting Grooming Details (indicate cue type and reason): DOE EOB Upper Body Bathing: Sitting;Moderate assistance Upper Body Bathing Details (indicate cue type and reason):  DOE Lower Body Bathing: Total assistance;+2 for physical assistance;+2 for safety/equipment;Bed level   Upper Body Dressing : Moderate assistance;Sitting Upper Body Dressing Details (indicate cue type and reason): increased  body habitus for upper body reach and DOE Lower Body Dressing: Total assistance;+2 for physical assistance;+2 for safety/equipment;Bed level Lower Body Dressing Details (indicate cue type and reason): unable to reach below upper abdomen due to body habitus     Toileting- Clothing Manipulation and Hygiene: Total assistance;Bed level Toileting - Clothing Manipulation Details (indicate cue type and reason): Purewick in place and bed level bowel management     Functional mobility during ADLs:  (O2 needed up to 10 ltrs to tolerate EOB this session) General ADL Comments: poor functional reach and activity tolerance for UB self care, total A for LB     Vision Baseline Vision/History: 0 No visual deficits;1 Wears glasses Ability to See in Adequate Light: 0 Adequate Patient Visual Report: No change from baseline Vision Assessment?: No apparent visual deficits            Pertinent Vitals/Pain Pain Assessment Pain Assessment: Faces Faces Pain Scale: Hurts whole lot Pain Location: bil LEs with touch/assist Pain Descriptors / Indicators: Sore, Tender, Sharp Pain Intervention(s): Limited activity within patient's tolerance, Monitored during session, Repositioned, Ice applied, RN gave pain meds during session, Patient requesting pain meds-RN notified     Extremity/Trunk Assessment Upper Extremity Assessment Upper Extremity Assessment: Right hand dominant;Generalized weakness   Lower Extremity Assessment Lower Extremity Assessment: Generalized weakness RLE Deficits / Details: bilateral chronic lymphedema, legs wrapped, spouse reports edema decreased since admission, long toenails imeding sock donning   Cervical / Trunk Assessment Cervical / Trunk Assessment: Other exceptions (increased body habitus)   Communication Communication Communication: No apparent difficulties Factors Affecting Communication: Other (comment) (can e difficulty to communicated with severe DOE)   Cognition Arousal:  Alert Behavior During Therapy: WFL for tasks assessed/performed Cognition: No apparent impairments                               Following commands: Intact       Cueing  General Comments   Cueing Techniques: Verbal cues  B LE wraps with drainage nursing about to changed, O2 sats 88% on 7 ltrs with bump up to 10 ltrs for EOB sitting with DOE and increased RR; B LE skin integrity, toenails and edema compromise   Exercises Exercises: Other exercises (educated on pursed lip breathing integration with mobility and BADL's)        Home Living Family/patient expects to be discharged to:: Private residence Living Arrangements: Spouse/significant other Available Help at Discharge: Family;Available 24 hours/day Type of Home: House Home Access: Ramped entrance     Home Layout: One level     Bathroom Shower/Tub: Walk-in Pensions consultant: Standard Bathroom Accessibility: Yes How Accessible: Accessible via walker Home Equipment: Rolling Walker (2 wheels);Cane - single point;Wheelchair - manual;Shower seat;Grab bars - tub/shower;Adaptive equipment;Hand held shower head;Lift chair;Hospital bed;Toilet riser Adaptive Equipment: Reacher Additional Comments: O2 at baseline      Prior Functioning/Environment Prior Level of Function : Independent/Modified Independent             Mobility Comments: ambulatory short distance only with RW ADLs Comments: husband assists with bathing, dressing, toileting and all IADL's    OT Problem List: Decreased strength;Decreased range of motion;Decreased activity tolerance;Impaired balance (sitting and/or standing);Decreased knowledge of use of DME or AE;Decreased knowledge of precautions;Cardiopulmonary  status limiting activity;Obesity;Pain;Increased edema   OT Treatment/Interventions: Self-care/ADL training;Therapeutic exercise;Energy conservation;Neuromuscular education;DME and/or AE instruction;Therapeutic  activities;Patient/family education;Balance training      OT Goals(Current goals can be found in the care plan section)   Acute Rehab OT Goals Patient Stated Goal: to be able to breathe better OT Goal Formulation: With patient/family Time For Goal Achievement: 09/01/23 Potential to Achieve Goals: Fair ADL Goals Pt Will Perform Grooming: with supervision;sitting Pt Will Perform Upper Body Bathing: with min assist;sitting Pt Will Perform Upper Body Dressing: with min assist;sitting Pt Will Transfer to Toilet: bedside commode;stand pivot transfer;with +2 assist;with mod assist Pt/caregiver will Perform Home Exercise Program: Increased strength;Both right and left upper extremity;With Supervision;With written HEP provided   OT Frequency:  Min 2X/week       AM-PAC OT "6 Clicks" Daily Activity     Outcome Measure Help from another person eating meals?: A Little Help from another person taking care of personal grooming?: A Little Help from another person toileting, which includes using toliet, bedpan, or urinal?: Total Help from another person bathing (including washing, rinsing, drying)?: A Lot Help from another person to put on and taking off regular upper body clothing?: A Little Help from another person to put on and taking off regular lower body clothing?: Total 6 Click Score: 13   End of Session Equipment Utilized During Treatment: Gait belt;Oxygen  Nurse Communication: Mobility status;Patient requests pain meds;Other (comment) (SpO2 and O2 needs with nursing present for session)  Activity Tolerance: Patient limited by fatigue;Treatment limited secondary to medical complications (Comment) Patient left: in bed;with call bell/phone within reach;with nursing/sitter in room;with family/visitor present  OT Visit Diagnosis: Unsteadiness on feet (R26.81);Muscle weakness (generalized) (M62.81);Pain Pain - Right/Left:  (Bilateral) Pain - part of body: Leg                Time:  1352-1430 OT Time Calculation (min): 38 min Charges:  OT General Charges $OT Visit: 1 Visit OT Evaluation $OT Eval Moderate Complexity: 1 Mod OT Treatments $Therapeutic Activity: 23-37 mins  Daunte Oestreich OT/L Acute Rehabilitation Department  [phone]  08/18/2023, 4:39 PM

## 2023-08-18 NOTE — Progress Notes (Signed)
 PROGRESS NOTE Morgan Odonnell  GNF:[id number] DOB: Jan 02, 1951 DOA: 08/15/2023 PCP: Morgan Broad, MD  Brief Narrative/Hospital Course: 60 YOF w/ hx of OHS/OSA, CHRF on 3 L O2, restrictive lung disease secondary to obesity, morbid obesity, HFpEF, hypothyroidism, mood disorder, chronic pain presented with concern for increased somnolence at home. At time of interview patient is awake alert and fully oriented.  She reports that her husband has been more concerned because over the past few days she has been sleeping more often.She reports that she has had worsening shortness of breath, chest pressure at rest, cough productive with white sputum over the past week.  No fever, chills.  She has chronic lymphedema thinks that her legs have become more swollen.  She has chronic orthopnea.  She has previously been intolerant of noninvasive ventilation with sleep and does not use at home. In the ED: Labs showed BNP 821 bicarb 31 hemoglobin 9 previously 13 g, chest x-ray with basilar atelectasis RT>LT. Blood gases pH 7.4 PCO278 pO2 less than 30 compensated picture Patient admitted for increased somnolence with acute on chronic hypoxic and hypercapnic respiratory failure. Placed on nasal cannula oxygen , IV diuretics  Subjective: Patient seen and examined this morning Husband at the bedside> states he looks much improved Per nursing did not use CPAP last night Overnight afebrile BP stable mild tachypnea 18-24 on 7 L [place] ABG reviewed this morning pH 7.4 pCO2 65 previously 78 Labs with stable electrolytes chronic anemia mild thrombocytopenia  Assessment and plan:  Increased somnolence Acute on chronic hypoxic and hypercapnic respiratory - PTA on 3l  at rest on 4 L on ambulation OHS/OSA/Restrictive lung disease-no compliant with CPAP: At baseline w/ OSA/OHS and RLD and intolerant to noninvasive ventilation, admitted with increased somnolence acute on chronic hypoxia and hypercapnia.  pH appears  compensated although pco2 in 78> Mentataion stable, pco2 down to 65 6/9 am. She has chronic lymphedema.Viral etiology negative. Continue supplemental oxygen  wean as tolerated, continue IV diuretics for fluid overload and monitor intake output as below. Cont PT OT, incentive spirometry. Minimize sedative May need pulmonary eval at some point-she is followed by Morgan Odonnell, she may be willing to try CPAP this time    Acute on chronic HFpEF exacerbation Chronic lymphedema: Previously with EF 50% in 2020, on Lasix  40 twice daily PTA and has chronic lymphedema . BNP 821 up from prior. Chest x-ray with bibasilar atelectasis. Repeat echo: EF 50-50% no RWMA diastolic parameters indeterminate, severe mitral annular calcification AV tricuspid. Cont iv lasix  , and monitor daily I/O,weight, electrolytes.Cont salt/fluid restricted diet and monitor in tele. Net IO Since Admission: -4,430 mL [08/18/23 1019]  Filed Weights   08/16/23 0500 08/17/23 0500 08/18/23 0335  Weight: 133.6 kg (!) 155.2 kg (!) 149.6 kg    Recent Labs  Lab 08/15/23 2036 08/16/23 0038 08/17/23 0745 08/17/23 1252 08/18/23 0355  BNP 821.9*  --   --   --   --   BUN 15 14 11 11 12   CREATININE 0.69 0.57 0.55 0.44 0.49  K 3.5 3.3* 3.7 4.7 3.7  MG  --  1.9 2.0  --   --     Anemia, normocytic, new onset: Last labs prior to admission was in 4/'24 with hemoglobin of 13.3.  Currently holding the 9 g range workup shows iron deficiency, check Hemoccult, will need close outpatient follow-up at this time and add iron supplementation Recent Labs  Lab 08/15/23 2036 08/16/23 0038 08/18/23 0355  HGB 9.9* 9.9* 9.0*  HCT 33.9*  33.5* 31.1*   Hypothyroidism: TSH TFT slightly abnormal will need close follow-up and recheck  Mood disorder: Continue home citalopram   Chronic pain: PTA on cyclobenzaprine, Lyrica , baclofen 1, percocet 5/325 mg  tid.  Continue home meds cautiously avoid sedation and hypercapnia   Super more obesity w/ Body  mass index is 60.32 kg/m.: Will benefit with PCP follow-up, weight loss,healthy lifestyle and outpatient sleep eval if not done.  DVT prophylaxis: SCDs Start: 08/15/23 2312 Code Status:   Code Status: Full Code Family Communication: plan of care discussed with patient and her husband at bedside. Patient status is: Remains hospitalized because of severity of illness Level of care: Telemetry   Dispo: The patient is from: HOME W/ daughter.             Anticipated disposition: TBD.  Requested PT OT evaluation  Objective: Vitals last 24 hrs: Vitals:   08/18/23 0004 08/18/23 0333 08/18/23 0335 08/18/23 0750  BP:  (!) 157/58    Pulse:  89    Resp: (!) 24     Temp:  98.1 F (36.7 C)    TempSrc:  Oral    SpO2:  95%  96%  Weight:   (!) 149.6 kg   Height:        Physical Examination: General exam: alert awake, morbidly obese, on nasal cannula HEENT:Oral mucosa moist, Ear/Nose WNL grossly Respiratory system: Bilaterally diminished breath sound,no use of accessory muscle Cardiovascular system: S1 & S2 +, No JVD. Gastrointestinal system: Abdomen soft,NT,ND, BS+ Nervous System: Alert, awake, moving all extremities,and following commands. Extremities: LE edema ++ with hyperkeratotic skin changes, lymphedema Skin: No rashes,no icterus. MSK: Normal muscle bulk,tone, power   Data Reviewed: I have personally reviewed following labs and imaging studies ( see epic result tab) CBC: Recent Labs  Lab 08/15/23 2036 08/16/23 0038 08/18/23 0355  WBC 7.2 7.7 7.9  HGB 9.9* 9.9* 9.0*  HCT 33.9* 33.5* 31.1*  MCV 93.1 93.1 94.0  PLT 163 154 140*   CMP: Recent Labs  Lab 08/15/23 2036 08/16/23 0038 08/17/23 0745 08/17/23 1252 08/18/23 0355  NA 136 137 135 135 135  K 3.5 3.3* 3.7 4.7 3.7  CL 91* 91* 92* 92* 90*  CO2 38* 39* 36* 37* 37*  GLUCOSE 126* 97 115* 106* 87  BUN 15 14 11 11 12   CREATININE 0.69 0.57 0.55 0.44 0.49  CALCIUM  8.8* 8.8* 8.5* 8.4* 8.5*  MG  --  1.9 2.0  --   --    PHOS  --  3.0  --   --   --    GFR: Estimated Creatinine Clearance: 91.5 mL/min (by C-G formula based on SCr of 0.49 mg/dL). Recent Labs  Lab 08/15/23 2036  AST 23  ALT 11  ALKPHOS 79  BILITOT 0.8  PROT 8.3*  ALBUMIN 2.6*   No results for input(s): "LIPASE", "AMYLASE" in the last 168 hours. No results for input(s): "AMMONIA" in the last 168 hours. Coagulation Profile: No results for input(s): "INR", "PROTIME" in the last 168 hours. Unresulted Labs (From admission, onward)     Start     Ordered   08/18/23 [phone]  Occult blood card to lab, stool  Once,   R        08/18/23 0832   08/18/23 0500  CBC  Daily,   R      08/17/23 0739   08/18/23 0500  Basic metabolic panel with GFR  Daily,   R      08/17/23 0801  Antimicrobials/Microbiology: Anti-infectives (From admission, onward)    None      No results found for: "SDES", "SPECREQUEST", "CULT", "REPTSTATUS"  Procedures:  Medications reviewed:  Scheduled Meds:  citalopram   20 mg Oral Daily   ferrous sulfate  325 mg Oral BID WC   fluticasone  furoate-vilanterol  1 puff Inhalation Daily   furosemide   40 mg Intravenous BID   Gerhardt's butt cream   Topical BID   hydrocerin   Topical BID   levothyroxine   200 mcg Oral Q0600   nicotine   21 mg Transdermal Daily   mouth rinse  15 mL Mouth Rinse 4 times per day   potassium chloride   20 mEq Oral BID   pregabalin   75 mg Oral BID   sodium chloride  flush  3 mL Intravenous Q12H   umeclidinium bromide   1 puff Inhalation Daily   Continuous Infusions:  Lesa Rape, MD [facility] 08/18/2023, 10:19 AM

## 2023-08-19 DIAGNOSIS — I5033 Acute on chronic diastolic (congestive) heart failure: Secondary | ICD-10-CM | POA: Diagnosis not present

## 2023-08-19 LAB — CBC
HCT: 33.1 % — ABNORMAL LOW (ref 36.0–46.0)
Hemoglobin: 9.8 g/dL — ABNORMAL LOW (ref 12.0–15.0)
MCH: 27 pg (ref 26.0–34.0)
MCHC: 29.6 g/dL — ABNORMAL LOW (ref 30.0–36.0)
MCV: 91.2 fL (ref 80.0–100.0)
Platelets: 148 10*3/uL — ABNORMAL LOW (ref 150–400)
RBC: 3.63 MIL/uL — ABNORMAL LOW (ref 3.87–5.11)
RDW: 17.1 % — ABNORMAL HIGH (ref 11.5–15.5)
WBC: 7.8 10*3/uL (ref 4.0–10.5)
nRBC: 0 % (ref 0.0–0.2)

## 2023-08-19 LAB — BASIC METABOLIC PANEL WITH GFR
Anion gap: 7 (ref 5–15)
BUN: 10 mg/dL (ref 8–23)
CO2: 39 mmol/L — ABNORMAL HIGH (ref 22–32)
Calcium: 9 mg/dL (ref 8.9–10.3)
Chloride: 88 mmol/L — ABNORMAL LOW (ref 98–111)
Creatinine, Ser: 0.66 mg/dL (ref 0.44–1.00)
GFR, Estimated: >60 mL/min (ref >60)
Glucose, Bld: 79 mg/dL (ref 70–99)
Potassium: 3.7 mmol/L (ref 3.5–5.1)
Sodium: 134 mmol/L — ABNORMAL LOW (ref 135–145)

## 2023-08-19 LAB — BLOOD GAS, VENOUS
Acid-Base Excess: 21.8 mmol/L — ABNORMAL HIGH (ref 0.0–2.0)
Bicarbonate: 47.9 mmol/L — ABNORMAL HIGH (ref 20.0–28.0)
O2 Saturation: 91.5 %
Patient temperature: 36.8
pCO2, Ven: 60 mmHg (ref 44–60)
pH, Ven: 7.51 — ABNORMAL HIGH (ref 7.25–7.43)
pO2, Ven: 58 mmHg — ABNORMAL HIGH (ref 32–45)

## 2023-08-19 NOTE — Progress Notes (Signed)
 PT Cancellation Note  Patient Details Name: Morgan Odonnell MRN: [id number] DOB: Dec 24, 1950   Cancelled Treatment:    Reason Eval/Treat Not Completed: Fatigue/lethargy limiting ability to participate. Spouse present and indicated that pt is resting and did not want therapist to rouse. PT to return if schedule allows and continue to follow pt acutely.   Cary Clarks, PT Acute Rehab   Morgan Odonnell 08/19/2023, 2:22 PM

## 2023-08-19 NOTE — TOC Progression Note (Signed)
 Transition of Care [facility]) - Progression Note   Patient Details  Name: KARALYNN COTTONE MRN: [id number] Date of Birth: 1950-09-05  Transition of Care [facility]) CM/SW Contact  Zenon Hilda, LCSW Phone Number: 08/19/2023, 3:11 PM  Clinical Narrative: HHPT was recommended, but PT will be following back up with patient to determine if the recommendation will change. Patient currently requiring 9L HFNC. TOC to follow.  Barriers to Discharge: Continued Medical Work up  Expected Discharge Plan and Services In-house Referral: NA Living arrangements for the past 2 months: Single Family Home           DME Arranged: N/A DME Agency: NA HH Arranged: NA HH Agency: NA  Social Determinants of Health (SDOH) Interventions SDOH Screenings   Food Insecurity: No Food Insecurity (08/16/2023)  Housing: Low Risk  (08/16/2023)  Transportation Needs: No Transportation Needs (08/16/2023)  Utilities: Not At Risk (08/16/2023)  Depression (PHQ2-9): Medium Risk (01/13/2019)  Social Connections: Moderately Integrated (08/16/2023)  Tobacco Use: Medium Risk (08/15/2023)   Readmission Risk Interventions    08/17/2023    9:31 AM  Readmission Risk Prevention Plan  Transportation Screening Complete  PCP or Specialist Appt within 5-7 Days Complete  Home Care Screening Complete  Medication Review (RN CM) Complete

## 2023-08-19 NOTE — Progress Notes (Signed)
 PROGRESS NOTE ALEXANDR OEHLER  ZOX:[id number] DOB: Dec 17, 1950 DOA: 08/15/2023 PCP: Bertha Broad, MD  Brief Narrative/Hospital Course: 41 YOF w/ hx of OHS/OSA, CHRF on 3 L O2, restrictive lung disease secondary to obesity, morbid obesity, HFpEF, hypothyroidism, mood disorder, chronic pain presented with concern for increased somnolence at home. At time of interview patient is awake alert and fully oriented.  She reports that her husband has been more concerned because over the past few days she has been sleeping more often.She reports that she has had worsening shortness of breath, chest pressure at rest, cough productive with white sputum over the past week.  No fever, chills.  She has chronic lymphedema thinks that her legs have become more swollen.  She has chronic orthopnea.  She has previously been intolerant of noninvasive ventilation with sleep and does not use at home. In the ED: Labs showed BNP 821 bicarb 31 hemoglobin 9 previously 13 g, chest x-ray with basilar atelectasis RT>LT. Blood gases pH 7.4 PCO278 pO2 less than 30 compensated picture Patient admitted for increased somnolence with acute on chronic hypoxic and hypercapnic respiratory failure. Placed on nasal cannula oxygen , IV diuretics  Subjective: Seen and examined this morning Husband at the bedside patient is more alert awake Overnight afebrile BP stable  Patient needs CPAP machine  Obtained VBG today > shows pCO2 improved to 60 Labs with stable electrolytes chronic anemia mild thrombocytopenia  Assessment and plan:  Increased somnolence Acute on chronic hypoxic and hypercapnic respiratory - PTA on 3l [place] at rest on 4 L on ambulation OHS/OSA/Restrictive lung disease-no compliant with CPAP: At baseline w/ OSA/OHS and RLD and intolerant to noninvasive ventilation, admitted with increased somnolence acute on chronic hypoxia and hypercapnia.  pH appears compensated although pco2 in 78> Mentataion stable, pco2 down to 65  6/9 am. She has chronic lymphedema.Viral etiology negative. Continue supplemental oxygen  wean as tolerated, Added metolazone x 1 6/9 with significant diuresis continue IV Lasix  as ordered.   Cont PT OT, incentive spirometry. Minimize sedative May need pulmonary eval at some point-she is followed by Dr. Marygrace Snellen, she may be willing to try CPAP this time    Acute on chronic HFpEF exacerbation Chronic lymphedema: Previously with EF 50% in 2020, on Lasix  40 twice daily PTA and has chronic lymphedema . BNP 821 up from prior. Chest x-ray with bibasilar atelectasis. Repeat echo: EF 50-50% no RWMA diastolic parameters indeterminate, severe mitral annular calcification AV tricuspid. Cont iv lasix  as ordered, after metolazone x 1 6/10 had good diuresis OT is much improved see below renal function stable.  Cont to monitor daily I/O,weight, electrolytes.Cont salt/fluid restricted diet and monitor in tele. Net IO Since Admission: -6,257 mL [08/19/23 1130]  Filed Weights   08/17/23 0500 08/18/23 0335 08/19/23 0500  Weight: (!) 155.2 kg (!) 149.6 kg (!) 146.9 kg    Recent Labs  Lab 08/15/23 2036 08/16/23 0038 08/17/23 0745 08/17/23 1252 08/18/23 0355 08/19/23 0411  BNP 821.9*  --   --   --   --   --   BUN 15 14 11 11 12 10   CREATININE 0.69 0.57 0.55 0.44 0.49 0.66  K 3.5 3.3* 3.7 4.7 3.7 3.7  MG  --  1.9 2.0  --   --   --     Anemia, normocytic, new onset: Last labs prior to admission was in 4/'24 with hemoglobin of 13.3.  Currently holding the 9 g range workup shows iron deficiency, check Hemoccult If able will need close outpatient  follow-up at this time and add iron supplementation Recent Labs  Lab 08/15/23 2036 08/16/23 0038 08/18/23 0355 08/19/23 0411  HGB 9.9* 9.9* 9.0* 9.8*  HCT 33.9* 33.5* 31.1* 33.1*   Hypothyroidism: TSH TFT slightly abnormal will need close follow-up and recheck  Mood disorder: Continue home citalopram   Deconditioning, debility Chronic pain: PTA on  cyclobenzaprine, Lyrica , baclofen 1, percocet 5/325 mg  tid.  Continue home meds cautiously avoid sedation and hypercapnia. Continue PT OT, will benefit with skilled nursing facility but she seems reluctant   Super more obesity w/ Body mass index is 59.23 kg/m.: Will benefit with PCP follow-up, weight loss,healthy lifestyle and outpatient sleep eval if not done.  DVT prophylaxis: SCDs Start: 08/15/23 2312 Code Status:   Code Status: Full Code Family Communication: plan of care discussed with patient and her husband at bedside. Patient status is: Remains hospitalized because of severity of illness Level of care: Telemetry   Dispo: The patient is from: HOME W/ daughter.             Anticipated disposition: TBD.   Objective: Vitals last 24 hrs: Vitals:   08/18/23 1954 08/19/23 0414 08/19/23 0500 08/19/23 0822  BP: (!) 115/55 115/62    Pulse: 94 85    Resp: 19 19    Temp: 97.9 F (36.6 C) 98.4 F (36.9 C)    TempSrc: Oral Oral    SpO2: 93% 95%  94%  Weight:   (!) 146.9 kg   Height:        Physical Examination: General exam: alert awake, obese, anemia oriented at baseline, older than stated age HEENT:Oral mucosa moist, Ear/Nose WNL grossly Respiratory system: Bilaterally diminished BS,no use of accessory muscle Cardiovascular system: S1 & S2 +, No JVD. Gastrointestinal system: Abdomen soft,NT,ND, BS+ Nervous System: Alert, awake, moving all extremities,and following commands. Extremities: LE edema ++ lymphedema hyperkeratotic skin changes Skin: No rashes,no icterus. MSK: Normal muscle bulk,tone, power  Data Reviewed: I have personally reviewed following labs and imaging studies ( see epic result tab) CBC: Recent Labs  Lab 08/15/23 2036 08/16/23 0038 08/18/23 0355 08/19/23 0411  WBC 7.2 7.7 7.9 7.8  HGB 9.9* 9.9* 9.0* 9.8*  HCT 33.9* 33.5* 31.1* 33.1*  MCV 93.1 93.1 94.0 91.2  PLT 163 154 140* 148*   CMP: Recent Labs  Lab 08/16/23 0038 08/17/23 0745  08/17/23 1252 08/18/23 0355 08/19/23 0411  NA 137 135 135 135 134*  K 3.3* 3.7 4.7 3.7 3.7  CL 91* 92* 92* 90* 88*  CO2 39* 36* 37* 37* 39*  GLUCOSE 97 115* 106* 87 79  BUN 14 11 11 12 10   CREATININE 0.57 0.55 0.44 0.49 0.66  CALCIUM  8.8* 8.5* 8.4* 8.5* 9.0  MG 1.9 2.0  --   --   --   PHOS 3.0  --   --   --   --    GFR: Estimated Creatinine Clearance: 90.4 mL/min (by C-G formula based on SCr of 0.66 mg/dL). Recent Labs  Lab 08/15/23 2036  AST 23  ALT 11  ALKPHOS 79  BILITOT 0.8  PROT 8.3*  ALBUMIN 2.6*   No results for input(s): "LIPASE", "AMYLASE" in the last 168 hours. No results for input(s): "AMMONIA" in the last 168 hours. Coagulation Profile: No results for input(s): "INR", "PROTIME" in the last 168 hours. Unresulted Labs (From admission, onward)     Start     Ordered   08/18/23 0833  Occult blood card to lab, stool  Once,  R        08/18/23 0832   08/18/23 0500  CBC  Daily,   R      08/17/23 0739   08/18/23 0500  Basic metabolic panel with GFR  Daily,   R      08/17/23 0801           Antimicrobials/Microbiology: Anti-infectives (From admission, onward)    None      No results found for: "SDES", "SPECREQUEST", "CULT", "REPTSTATUS"  Procedures:  Medications reviewed:  Scheduled Meds:  Chlorhexidine  Gluconate Cloth  6 each Topical Daily   citalopram   20 mg Oral Daily   ferrous sulfate  325 mg Oral BID WC   fluticasone  furoate-vilanterol  1 puff Inhalation Daily   furosemide   40 mg Intravenous BID   Gerhardt's butt cream   Topical BID   hydrocerin   Topical BID   levothyroxine   200 mcg Oral Q0600   mupirocin ointment  1 Application Nasal BID   nicotine   21 mg Transdermal Daily   mouth rinse  15 mL Mouth Rinse 4 times per day   potassium chloride   20 mEq Oral BID   pregabalin   75 mg Oral BID   sodium chloride  flush  3 mL Intravenous Q12H   umeclidinium bromide   1 puff Inhalation Daily   Continuous Infusions:  Lesa Rape, MD [facility] 08/19/2023, 11:31 AM

## 2023-08-20 ENCOUNTER — Telehealth: Payer: Self-pay | Admitting: Acute Care

## 2023-08-20 DIAGNOSIS — G4733 Obstructive sleep apnea (adult) (pediatric): Secondary | ICD-10-CM | POA: Diagnosis not present

## 2023-08-20 DIAGNOSIS — I5033 Acute on chronic diastolic (congestive) heart failure: Secondary | ICD-10-CM | POA: Diagnosis not present

## 2023-08-20 DIAGNOSIS — J9622 Acute and chronic respiratory failure with hypercapnia: Secondary | ICD-10-CM | POA: Diagnosis not present

## 2023-08-20 DIAGNOSIS — J449 Chronic obstructive pulmonary disease, unspecified: Secondary | ICD-10-CM | POA: Diagnosis not present

## 2023-08-20 DIAGNOSIS — J810 Acute pulmonary edema: Secondary | ICD-10-CM

## 2023-08-20 DIAGNOSIS — I5031 Acute diastolic (congestive) heart failure: Secondary | ICD-10-CM

## 2023-08-20 DIAGNOSIS — J9621 Acute and chronic respiratory failure with hypoxia: Secondary | ICD-10-CM | POA: Diagnosis not present

## 2023-08-20 DIAGNOSIS — I2781 Cor pulmonale (chronic): Secondary | ICD-10-CM

## 2023-08-20 LAB — BASIC METABOLIC PANEL WITH GFR
Anion gap: 10 (ref 5–15)
BUN: 12 mg/dL (ref 8–23)
CO2: 40 mmol/L — ABNORMAL HIGH (ref 22–32)
Calcium: 9.1 mg/dL (ref 8.9–10.3)
Chloride: 83 mmol/L — ABNORMAL LOW (ref 98–111)
Creatinine, Ser: 0.67 mg/dL (ref 0.44–1.00)
GFR, Estimated: >60 mL/min (ref >60)
Glucose, Bld: 120 mg/dL — ABNORMAL HIGH (ref 70–99)
Potassium: 3.3 mmol/L — ABNORMAL LOW (ref 3.5–5.1)
Sodium: 133 mmol/L — ABNORMAL LOW (ref 135–145)

## 2023-08-20 LAB — CBC
HCT: 32.8 % — ABNORMAL LOW (ref 36.0–46.0)
Hemoglobin: 9.9 g/dL — ABNORMAL LOW (ref 12.0–15.0)
MCH: 27.2 pg (ref 26.0–34.0)
MCHC: 30.2 g/dL (ref 30.0–36.0)
MCV: 90.1 fL (ref 80.0–100.0)
Platelets: 170 10*3/uL (ref 150–400)
RBC: 3.64 MIL/uL — ABNORMAL LOW (ref 3.87–5.11)
RDW: 16.9 % — ABNORMAL HIGH (ref 11.5–15.5)
WBC: 8.8 10*3/uL (ref 4.0–10.5)
nRBC: 0 % (ref 0.0–0.2)

## 2023-08-20 MED ORDER — METOLAZONE 5 MG PO TABS
10.0000 mg | ORAL_TABLET | Freq: Every day | ORAL | Status: DC
  Administered 2023-08-20 – 2023-08-26 (×6): 10 mg via ORAL
  Filled 2023-08-20 (×7): qty 2

## 2023-08-20 NOTE — Progress Notes (Signed)
 PROGRESS NOTE Morgan Odonnell  JWJ:[id number] DOB: February 25, 1951 DOA: 08/15/2023 PCP: Bertha Broad, MD  Brief Narrative/Hospital Course: 18 YOF w/ hx of OHS/OSA, CHRF on 3 L O2, restrictive lung disease secondary to obesity, morbid obesity, HFpEF, hypothyroidism, mood disorder, chronic pain presented with concern for increased somnolence at home. At time of interview patient is awake alert and fully oriented.  She reports that her husband has been more concerned because over the past few days she has been sleeping more often.She reports that she has had worsening shortness of breath, chest pressure at rest, cough productive with white sputum over the past week.  No fever, chills.  She has chronic lymphedema thinks that her legs have become more swollen.  She has chronic orthopnea.  She has previously been intolerant of noninvasive ventilation with sleep and does not use at home. In the ED: Labs showed BNP 821 bicarb 31 hemoglobin 9 previously 13 g, chest x-ray with basilar atelectasis RT>LT. Blood gases pH 7.4 PCO278 pO2 less than 30 compensated picture Patient admitted for increased somnolence with acute on chronic hypoxic and hypercapnic respiratory failure. Placed on nasal cannula oxygen , IV diuretics and CPAP and overall mentation has improved.  Subjective: Patient seen examined this morning Husband at the bedside again, Patient is awake interactive Overnight afebrile BP stable  Weight 0n admission 342 pound-295 likely false> weight down to 314 Still on 7 L [place] daytime, used CPAP overnight Labs with mild hyponatremia hypokalemia stable renal function  Assessment and plan:  Increased somnolence Acute on chronic hypoxic and hypercapnic respiratory - PTA on 3l [place] at rest on 4 L on ambulation OHS/OSA/Restrictive lung disease-no compliant with CPAP: At baseline w/ OSA/OHS and RLD and intolerant to noninvasive ventilation, admitted with increased somnolence acute on chronic hypoxia and  hypercapnia.  pH appears compensated although pco2 in 78 [place] stable, pco2 down to 65 6/9 am.She has chronic lymphedema.Viral etiology negative. Cont PT OT, incentive spirometry. Minimize sedatives she may be willing to try CPAP this time  Informed TOC, reaching  out to [employer] [facility]  to see if CPAP  or BIPAP can be approved. I have reached out  PCCM team Pete to assist-she is followed by Dr. Marygrace Snellen   Acute on chronic HFpEF exacerbation Chronic lymphedema: Previously with EF 50% in 2020, on Lasix  40 twice daily PTA and has chronic lymphedema . BNP 821 up from prior. Chest x-ray with bibasilar atelectasis. Repeat echo: EF 50-50% no RWMA diastolic parameters indeterminate, severe mitral annular calcification AV tricuspid. S/p Metolazone x 1  on 6/9 with significant diuresis. Cont on iv Lasix  as ordered and monitor intake output net negative balance. Wt on admit 342 lb and 295 lb on 6/7-?false> wt down to 314 Cont to monitor daily I/O,weight, electrolytes.Cont salt/fluid restricted diet and monitor in tele. Net IO Since Admission: -9,837 mL [08/20/23 0857]  Filed Weights   08/18/23 0335 08/19/23 0500 08/20/23 0500  Weight: (!) 149.6 kg (!) 146.9 kg (!) 142.6 kg    Recent Labs  Lab 08/15/23 2036 08/16/23 0038 08/17/23 0745 08/17/23 1252 08/18/23 0355 08/19/23 0411 08/20/23 0412  BNP 821.9*  --   --   --   --   --   --   BUN 15 14 11 11 12 10 12   CREATININE 0.69 0.57 0.55 0.44 0.49 0.66 0.67  K 3.5 3.3* 3.7 4.7 3.7 3.7 3.3*  MG  --  1.9 2.0  --   --   --   --  Anemia, normocytic, new onset: Last labs prior to admission was in 4/'24 with hemoglobin of 13.3. Currently holding the 9 g range workup shows iron deficiency, check Hemoccult If able. Will need close outpatient follow-up at this time and add iron supplementation. Recent Labs  Lab 08/15/23 2036 08/16/23 0038 08/18/23 0355 08/19/23 0411 08/20/23 0412  HGB 9.9* 9.9* 9.0* 9.8* 9.9*  HCT 33.9* 33.5* 31.1* 33.1*  32.8*   Hypokalemia: Continue K+ supplementation  Hypervolemic hyponatremia: Continue diuretics and monitor.  Hypothyroidism: TSH TFT slightly abnormal will need close follow-up and recheck  Mood disorder: Continue home citalopram   Deconditioning, debility Chronic pain: PTA on cyclobenzaprine, Lyrica , baclofen 1, percocet 5/325 mg  tid. Continue home meds cautiously avoid sedation and hypercapnia. Continue PT OT, will benefit with skilled nursing facility but she seems reluctant.   Super more obesity w/ Body mass index is 57.5 kg/m.: Will benefit with PCP follow-up, weight loss,healthy lifestyle and outpatient sleep eval if not done.  DVT prophylaxis: SCDs Start: 08/15/23 2312 Code Status:   Code Status: Full Code Family Communication: plan of care discussed with patient and her husband at bedside. Patient status is: Remains hospitalized because of severity of illness Level of care: Telemetry   Dispo: The patient is from: HOME W/ daughter.             Anticipated disposition: TBD.   Objective: Vitals last 24 hrs: Vitals:   08/20/23 0500 08/20/23 0510 08/20/23 0828 08/20/23 1008  BP:  (!) 125/55    Pulse:  89    Resp:  15    Temp:  98.3 F (36.8 C)    TempSrc:      SpO2:  96% 92% 90%  Weight: (!) 142.6 kg     Height:        Physical Examination: General exam: alert awake, oriented HEENT:Oral mucosa moist, Ear/Nose WNL grossly Respiratory system: Bilaterally clear BS,no use of accessory muscle Cardiovascular system: S1 & S2 +, No JVD. Gastrointestinal system: Abdomen soft, obese, NT,ND, BS+ Nervous System: Alert, awake, moving all extremities,and following commands. Extremities: LE edema present with lymphedema chronic hyperkeratotic skin changes Skin: No rashes,no icterus. MSK: Normal muscle bulk,tone, power   Data Reviewed: I have personally reviewed following labs and imaging studies ( see epic result tab) CBC: Recent Labs  Lab 08/15/23 2036  08/16/23 0038 08/18/23 0355 08/19/23 0411 08/20/23 0412  WBC 7.2 7.7 7.9 7.8 8.8  HGB 9.9* 9.9* 9.0* 9.8* 9.9*  HCT 33.9* 33.5* 31.1* 33.1* 32.8*  MCV 93.1 93.1 94.0 91.2 90.1  PLT 163 154 140* 148* 170   CMP: Recent Labs  Lab 08/16/23 0038 08/17/23 0745 08/17/23 1252 08/18/23 0355 08/19/23 0411 08/20/23 0412  NA 137 135 135 135 134* 133*  K 3.3* 3.7 4.7 3.7 3.7 3.3*  CL 91* 92* 92* 90* 88* 83*  CO2 39* 36* 37* 37* 39* 40*  GLUCOSE 97 115* 106* 87 79 120*  BUN 14 11 11 12 10 12   CREATININE 0.57 0.55 0.44 0.49 0.66 0.67  CALCIUM  8.8* 8.5* 8.4* 8.5* 9.0 9.1  MG 1.9 2.0  --   --   --   --   PHOS 3.0  --   --   --   --   --    GFR: Estimated Creatinine Clearance: 88.7 mL/min (by C-G formula based on SCr of 0.67 mg/dL). Recent Labs  Lab 08/15/23 2036  AST 23  ALT 11  ALKPHOS 79  BILITOT 0.8  PROT 8.3*  ALBUMIN 2.6*  No results for input(s): LIPASE, AMYLASE in the last 168 hours. No results for input(s): AMMONIA in the last 168 hours. Coagulation Profile: No results for input(s): INR, PROTIME in the last 168 hours. Unresulted Labs (From admission, onward)     Start     Ordered   08/18/23 [phone]  Occult blood card to lab, stool  Once,   R        08/18/23 0832   08/18/23 0500  CBC  Daily,   R      08/17/23 0739   08/18/23 0500  Basic metabolic panel with GFR  Daily,   R      08/17/23 0801           Antimicrobials/Microbiology: Anti-infectives (From admission, onward)    None      No results found for: SDES, SPECREQUEST, CULT, REPTSTATUS  Procedures:  Medications reviewed:  Scheduled Meds:  Chlorhexidine  Gluconate Cloth  6 each Topical Daily   citalopram   20 mg Oral Daily   ferrous sulfate  325 mg Oral BID WC   fluticasone  furoate-vilanterol  1 puff Inhalation Daily   furosemide   40 mg Intravenous BID   Gerhardt's butt cream   Topical BID   hydrocerin   Topical BID   levothyroxine   200 mcg Oral Q0600   mupirocin ointment  1  Application Nasal BID   nicotine   21 mg Transdermal Daily   mouth rinse  15 mL Mouth Rinse 4 times per day   potassium chloride   20 mEq Oral BID   pregabalin   75 mg Oral BID   sodium chloride  flush  3 mL Intravenous Q12H   umeclidinium bromide   1 puff Inhalation Daily   Continuous Infusions:  Lesa Rape, MD [facility] 08/20/2023, 12:03 PM

## 2023-08-20 NOTE — Telephone Encounter (Signed)
 Patient scheduled.

## 2023-08-20 NOTE — TOC Progression Note (Signed)
 Transition of Care [facility]) - Progression Note   Patient Details  Name: Morgan Odonnell MRN: [id number] Date of Birth: 06-07-1950  [facility] CM/SW Contact  Zenon Hilda, LCSW Phone Number: 08/20/2023, 2:57 PM  Clinical Narrative: CSW notified by hospitalist that patient may need home BiPAP/NIV. CSW made referral to [facility] with [employer] to have patient reviewed for BiPAP/NIV. TOC awaiting updated PT notes.  Barriers to Discharge: Continued Medical Work up  Expected Discharge Plan and Services In-house Referral: NA Living arrangements for the past 2 months: Single Family Home             DME Arranged: N/A DME Agency: NA HH Arranged: NA HH Agency: NA  Social Determinants of Health (SDOH) Interventions SDOH Screenings   Food Insecurity: No Food Insecurity (08/16/2023)  Housing: Low Risk  (08/16/2023)  Transportation Needs: No Transportation Needs (08/16/2023)  Utilities: Not At Risk (08/16/2023)  Depression (PHQ2-9): Medium Risk (01/13/2019)  Social Connections: Moderately Integrated (08/16/2023)  Tobacco Use: Medium Risk (08/15/2023)   Readmission Risk Interventions    08/17/2023    9:31 AM  Readmission Risk Prevention Plan  Transportation Screening Complete  PCP or Specialist Appt within 5-7 Days Complete  Home Care Screening Complete  Medication Review (RN CM) Complete

## 2023-08-20 NOTE — Consult Note (Signed)
 NAME:  KEYSHIA ORWICK, MRN:  [id number], DOB:  [birth year], LOS: 5 ADMISSION DATE:  08/15/2023, CONSULTATION DATE:  6/11 REFERRING MD:  Bobbetta Burnet, CHIEF COMPLAINT:  acute on chr resp failure    History of Present Illness:  73 year old female w/ sig h/o tobacco abuse, obesity, OHS/OSA COPD GOLD C and chronic hypoxic and Hypercarbic resp failure.  Admitted 6/6 w/ decreased MS and hypersomnolence Baseline serum bicarb 38, BNP elevated 800, congestion on CXR,  and VBG w/ PCO2 78, bicarb 51 and ph 7.42 Admitted w/ working dx of acute diastolic HF  Therapeutic interventions included: supplemental oxygen , aggressive diuresis and also was placed on NIPPV.  Her mental status improved, currently s/p > 10 liters diuresis w/ wt that went from 342 to 314 lbs from time of admit to 6/11 and was nearing discharge and PCCM asked to eval to assist w/ setting pt up w/ home nocturnal ventilatory assist for HS.   Baseline: lives w husband. Decreased activity ~ 1 yr. Sleeping in recliner/essentially living in recliner for several months. About a month ago was last time she was able to walk w/ significant assist from husband  Pertinent  Medical History  Obesity w/ associated restrictive lung disease, currently untreated OSA and OHS, prior smoker, chronic bronchitis, COPD FeV1 57% FeV1/FVC 70% baseline exertional dyspnea, chronic hypoxic & hypercarbic resp failure  HFpEF, hypothyroidism, chronic lymphedema, chronic pain, chronic orthopnea   Significant Hospital Events: Including procedures, antibiotic start and stop dates in addition to other pertinent events   6/6 admitted w/ acute on chronic diastolic HF, vol overload, hypoxic and hypercarbic resp failure  6/7 ECHO Left Ventricle: Left ventricular ejection fraction, by visual estimation, is 50 to 55%. The left ventricle has low normal function. The left ventricle is not well visualized. There is no left ventricular  hypertrophy. Left ventricular diastolic parameters are  consistent with Grade I diastolic dysfunction (impaired relaxation). Elevated left atrial pressure. Right Ventricle: The right ventricular size is normal. Mildly increased right ventricular wall thickness. Global RV systolic function is was not well visualized. Left Atrium: Left atrial size was mild-moderately dilated. Right Atrium: Right atrial size was moderately dilated  Pericardium: There is no evidence of pericardial effusion.  Mitral Valve: The mitral valve is normal in structure. Moderate mitral  annular calcification. No evidence of mitral valve regurgitation. No  evidence of mitral valve stenosis by observation. MV peak gradient, 10.1  mmHg. Tricuspid Valve: The tricuspid valve is grossly normal. Tricuspid valve regurgitation is not demonstrated.  6/11 pulm consulted     Interim History / Subjective:  Feels better   Objective    Blood pressure (!) 125/55, pulse 89, temperature 98.3 F (36.8 C), resp. rate 15, height 5' 2 (1.575 m), weight (!) 142.6 kg, SpO2 90%.        Intake/Output Summary (Last 24 hours) at 08/20/2023 1219 Last data filed at 08/20/2023 1100 Gross per 24 hour  Intake 120 ml  Output 4800 ml  Net -4680 ml   Filed Weights   08/18/23 0335 08/19/23 0500 08/20/23 0500  Weight: (!) 149.6 kg (!) 146.9 kg (!) 142.6 kg    Examination: General: chronically ill appearing 73 year old female she is laying in bed no distress  HENT: NCAT no JVD  Lungs: crackles both bases  Cardiovascular: RRR  Abdomen: large  Extremities: chronic LE venous stasis marked LE swelling/edema Neuro: alert and oriented   Resolved problem list   Assessment and Plan   Acute on chronic  hypoxic and hypercarbic resp failure Un-treated OSA Restrictive lung disease from obesity w/ obesity hypoventilation syndrome  GOLD -C COPD Cor pulmonale Group 3 PH Acute diastolic HF  Pulmonary edema  Volume overload  Lymphedema  Anemia  Deconditioning   Pulm problem list Acute on chronic  hypoxic and hypercarbic resp failure 2/2 under treated OHS/OSA (has been resistant to CPAP/BIPAP in past), GOLD C COPD and resultant type 3 PH, leading to acute cor pulmonale and decompensated diastolic HF.  -she is on a reasonable COPD regimen -already on home oxygen   -has responded well to diuresis but on exam still has significant volume to give.  -agreeable to NIPPV after discussing multiple benefits.  This patient requires non-invasive ventilator for acute in chronic respiratory failure and COPD. Without ventilator, there is significant risk of untimely readmission to the hospital and increase on CO2 retention. BIPAP is ineffective in the home setting in reducing CO2 due to the lack of an auto-rate.    Plan Would continue to push diuresis->would aim to get several more lbs of fluid off before releasing her  Wean O2 for sats > 90  Cont current BD regimen and resume home regimen at dc We will set her up for NIV at HS.    Best Practice (right click and Reselect all SmartList Selections daily)  Per primary  Labs   CBC: Recent Labs  Lab 08/15/23 2036 08/16/23 0038 08/18/23 0355 08/19/23 0411 08/20/23 0412  WBC 7.2 7.7 7.9 7.8 8.8  HGB 9.9* 9.9* 9.0* 9.8* 9.9*  HCT 33.9* 33.5* 31.1* 33.1* 32.8*  MCV 93.1 93.1 94.0 91.2 90.1  PLT 163 154 140* 148* 170    Basic Metabolic Panel: Recent Labs  Lab 08/16/23 0038 08/17/23 0745 08/17/23 1252 08/18/23 0355 08/19/23 0411 08/20/23 0412  NA 137 135 135 135 134* 133*  K 3.3* 3.7 4.7 3.7 3.7 3.3*  CL 91* 92* 92* 90* 88* 83*  CO2 39* 36* 37* 37* 39* 40*  GLUCOSE 97 115* 106* 87 79 120*  BUN 14 11 11 12 10 12   CREATININE 0.57 0.55 0.44 0.49 0.66 0.67  CALCIUM  8.8* 8.5* 8.4* 8.5* 9.0 9.1  MG 1.9 2.0  --   --   --   --   PHOS 3.0  --   --   --   --   --    GFR: Estimated Creatinine Clearance: 88.7 mL/min (by C-G formula based on SCr of 0.67 mg/dL). Recent Labs  Lab 08/16/23 0038 08/18/23 0355 08/19/23 0411 08/20/23 0412  WBC  7.7 7.9 7.8 8.8    Liver Function Tests: Recent Labs  Lab 08/15/23 2036  AST 23  ALT 11  ALKPHOS 79  BILITOT 0.8  PROT 8.3*  ALBUMIN 2.6*   No results for input(s): LIPASE, AMYLASE in the last 168 hours. No results for input(s): AMMONIA in the last 168 hours.  ABG    Component Value Date/Time   PHART 7.47 (H) 08/18/2023 0440   PCO2ART 65 (H) 08/18/2023 0440   PO2ART 84 08/18/2023 0440   HCO3 47.9 (H) 08/19/2023 0844   TCO2 35 (H) 06/27/2017 1339   O2SAT 91.5 08/19/2023 0844     Coagulation Profile: No results for input(s): INR, PROTIME in the last 168 hours.  Cardiac Enzymes: No results for input(s): CKTOTAL, CKMB, CKMBINDEX, TROPONINI in the last 168 hours.  HbA1C: Hgb A1c MFr Bld  Date/Time Value Ref Range Status  05/10/2019 02:26 PM 5.2 4.8 - 5.6 % Final    Comment:  Prediabetes: 5.7 - 6.4          Diabetes: >6.4          Glycemic control for adults with diabetes: <7.0   01/13/2019 04:46 PM 5.4 4.8 - 5.6 % Final    Comment:             Prediabetes: 5.7 - 6.4          Diabetes: >6.4          Glycemic control for adults with diabetes: <7.0     CBG: No results for input(s): GLUCAP in the last 168 hours.  Review of Systems:   Review of Systems  Constitutional:  Positive for fever.  HENT:  Negative for congestion.   Eyes: Negative.   Respiratory:  Positive for shortness of breath and wheezing.   Cardiovascular:  Positive for chest pain and leg swelling.  Gastrointestinal: Negative.   Genitourinary: Negative.   Musculoskeletal: Negative.      Past Medical History:  She,  has a past medical history of Allergies, Anemia, Back pain, Bilateral swelling of feet, CHF (congestive heart failure) (HCC), Chronic cough, COPD (chronic obstructive pulmonary disease) (HCC), Depression, GERD (gastroesophageal reflux disease), Heart murmur, Hypertension, Hypocapnia, Hypothyroid, Knee pain, Leg edema, Lightheaded, Low back pain, Morbid  obesity (HCC), Neck pain, Obesity hypoventilation syndrome (HCC), Oral lesion, Shortness of breath, Shortness of breath, Shoulder pain, and Snoring.   Surgical History:   Past Surgical History:  Procedure Laterality Date   APPENDECTOMY     CHOLECYSTECTOMY     COLONOSCOPY N/A 06/25/2013   Procedure: COLONOSCOPY;  Surgeon: Almeda Aris, MD;  Location: [facility]COPY;  Service: Endoscopy;  Laterality: N/A;   ECTOPIC PREGNANCY SURGERY     Left Ovary Removed     MENISCUS REPAIR     TONSILLECTOMY     TUBAL LIGATION       Social History:   reports that she quit smoking about 8 years ago. Her smoking use included cigarettes. She started smoking about 53 years ago. She has a 90 pack-year smoking history. She has never used smokeless tobacco. She reports that she does not drink alcohol and does not use drugs.   Family History:  Her family history includes Alcoholism in her mother; Anxiety disorder in her father; Asthma in her daughter; Cancer in her mother; Depression in her mother; Eating disorder in her father and mother; Heart disease in her mother; Heart failure in her mother; Hypertension in her father and mother; Lung disease in her mother; Lymphoma in her brother; Obesity in her father and mother; Ovarian cancer in her maternal aunt and mother; Seizures in her mother; Sleep apnea in her mother.   Allergies Allergies  Allergen Reactions   Gabapentin     slept for 2 days   Novocain [Procaine] Other (See Comments)    intolerance     Home Medications  Prior to Admission medications   Medication Sig Start Date End Date Taking? Authorizing Provider  albuterol  (PROVENTIL  HFA;VENTOLIN  HFA) 108 (90 BASE) MCG/ACT inhaler Inhale 2 puffs into the lungs every 6 (six) hours as needed for wheezing.    Yes [provider]  aspirin  EC 81 MG EC tablet Take 1 tablet (81 mg total) by mouth daily. 07/05/17  Yes Daren Eck, DO  baclofen (LIORESAL) 10 MG tablet Take 1 tablet by mouth 4 (four)  times daily as needed for muscle spasms.    Yes [provider]  budesonide -formoterol  (SYMBICORT ) 160-4.5 MCG/ACT inhaler INHALE TWO puffs  into lung TWICE DAILY 07/09/22  Yes Hunsucker, Archer Kobs, MD  Cholecalciferol (VITAMIN D -3) 125 MCG (5000 UT) TABS Take 1 tablet by mouth daily.   Yes [provider]  citalopram  (CELEXA ) 20 MG tablet Take 1 tablet (20 mg total) by mouth daily. 07/04/17  Yes Daren Eck, DO  cyclobenzaprine (FEXMID) 7.5 MG tablet Take 7.5 mg by mouth 3 (three) times daily. 07/15/23  Yes [provider]  diclofenac  sodium (VOLTAREN ) 1 % GEL Apply 1 application topically 4 (four) times daily. Applied to knees, shoulders, wrists, and ankles   Yes [provider]  furosemide  (LASIX ) 40 MG tablet 2 tablets (80mg ) by mouth two times a day Patient taking differently: 2 tablets (80mg ) by mouth two times a day  Takes 2 tables in morning and 1 tab in evening 08/18/15  Yes Darlis Eisenmenger, MD  levothyroxine  (SYNTHROID ) 200 MCG tablet Take 1 tablet (200 mcg total) by mouth daily before breakfast. 03/24/19  Yes Jonn Nett, DO  metoprolol  tartrate (LOPRESSOR ) 25 MG tablet Take 0.5 tablets (12.5 mg total) by mouth 2 (two) times daily. Patient taking differently: Take 12.5 mg by mouth daily. 07/04/17  Yes Daren Eck, DO  naloxone  (NARCAN ) nasal spray 4 mg/0.1 mL Place 1 spray into the nose once. 07/15/23  Yes [provider]  nicotine  polacrilex (NICORETTE ) 4 MG gum Take 4 mg by mouth as needed for smoking cessation.   Yes [provider]  oxyCODONE-acetaminophen  (PERCOCET/ROXICET) 5-325 MG tablet Take 1 tablet by mouth every 6 (six) hours as needed for severe pain (pain score 7-10).   Yes [provider]  potassium chloride  SA (K-DUR,KLOR-CON ) 20 MEQ tablet 2 tablets (40 mEq) by mouth in the AM and 1 tablet (20 mEq) by mouth in the PM 10/09/16  Yes Darlis Eisenmenger, MD  pregabalin  (LYRICA ) 75 MG capsule Take 75 mg by mouth every 12  (twelve) hours. 07/15/23  Yes [provider]  propranolol (INDERAL) 10 MG tablet Take 10 mg by mouth 2 (two) times daily as needed. 07/04/23  Yes [provider]  RELISTOR 150 MG TABS Take 3 tablets by mouth daily. 03/21/23  Yes [provider]  tiotropium (SPIRIVA  HANDIHALER) 18 MCG inhalation capsule Place 1 capsule (18 mcg total) into inhaler and inhale daily. 07/09/22  Yes Hunsucker, Archer Kobs, MD  Spacer/Aero-Holding Chambers (AEROCHAMBER Z-STAT PLUS [facility]) MISC Use as directed 05/10/15   Samual Crochet, MD     Critical care time: NA

## 2023-08-20 NOTE — Progress Notes (Signed)
   08/20/23 0415  BiPAP/CPAP/SIPAP  BiPAP/CPAP/SIPAP Pt Type Adult  BiPAP/CPAP/SIPAP DREAMSTATIOND  Mask Type Full face mask  Dentures removed? Not applicable  Mask Size Medium  Respiratory Rate 17 breaths/min  IPAP 18 cmH20  EPAP 8 cmH2O  Flow Rate 8 lpm (bled into circuit)  Patient Home Machine No  Patient Home Mask No  Patient Home Tubing No  Auto Titrate No  CPAP/SIPAP surface wiped down Yes  Device Plugged into RED Power Outlet Yes

## 2023-08-20 NOTE — Progress Notes (Signed)
 Heart Failure Navigator Progress Note  Assessed for Heart & Vascular TOC clinic readiness.  Patient does not meet criteria due to EF 50-55%, per MD decrease in functional states, patient to follow up with her PCP after discharge. No HF TOC. .   Navigator will sign off at this time.   Randie Bustle, BSN, Scientist, clinical (histocompatibility and immunogenetics) Only

## 2023-08-20 NOTE — Progress Notes (Signed)
 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 PMHx: OHS/OSA, CHRF on 3 L O2, restrictive lung disease secondary to obesity, morbid obesity, HFpEF, hypothyroidism, mood disorder, chronic pain, lymphedema, CHF, hypoventilation syndrome, heart murmur    PT Comments  Pt needing max encouragement/education for participation.  She fatigued easily with decreased O2 sats requiring frequent rest breaks and cues for breathing and increased time for recovery.  See comments for sats.  Progressed to EOB with min A but then needing mod A x 2 to stand briefly.  Pt reports anxious and wants to return to bed.  Pt is below baseline and needs assist of 2.  Updated recommendation to Patient will benefit from continued inpatient follow up therapy, <3 hours/day at d/c (notified MD and TOC. )   If plan is discharge home, recommend the following: Two people to help with walking and/or transfers;Two people to help with bathing/dressing/bathroom;Assistance with cooking/housework;Help with stairs or ramp for entrance   Can travel by private vehicle     No  Equipment Recommendations  None recommended by PT    Recommendations for Other Services       Precautions / Restrictions Precautions Precautions: Fall Precaution/Restrictions Comments: Watch O2; Fragile skin     Mobility  Bed Mobility Overal bed mobility: Needs Assistance Bed Mobility: Supine to Sit, Sit to Supine     Supine to sit: Min assist, Used rails Sit to supine: Mod assist, +2 for physical assistance   General bed mobility comments: Pt moving to sit on EOB with min A for trunk but heavy use of rails and on air mattress (with low fricition).  Mod A x 2 for legs and trunk to return to bed and max x 2 to scoot up in  bed.    Transfers Overall transfer level: Needs assistance Equipment used: 2 person hand held assist Transfers: Sit to/from Stand Sit to Stand: Mod assist, +2 physical assistance           General transfer comment: Pt wanting to return to supine but was near foot of bed.  Able to encourage/educate pt to perform stands/partial stands to move toward [facility].  Initial partial stand with min/mod A x2, 2 attempts not clearing buttock, and last attempt able to stand with assist of 2 briefly to scoot toward HOB.    Ambulation/Gait               General Gait Details: Only pivoted, did not step   Optometrist     Tilt Bed    Modified Rankin (Stroke Patients Only)       Balance Overall balance assessment: Needs assistance Sitting-balance support: Single extremity supported, Bilateral upper extremity supported Sitting balance-Leahy Scale: Poor Sitting balance - Comments: leaning L needing at least single UE support; did sit EOB at least 10 mins with CGA-min A     Standing balance-Leahy Scale: Poor Standing balance comment: Only stood briefly with assist of 2                            Communication    Cognition Arousal: Alert Behavior During Therapy: Anxious   PT - Cognitive impairments: Problem solving  PT - Cognition Comments: Pt needing max encouragement/education to participate.  Decreased insight to deficits and need for mobility if returning home        Cueing Cueing Techniques: Verbal cues, Gestural cues, Tactile cues, Visual cues  Exercises General Exercises - Lower Extremity Ankle Circles/Pumps: AROM, Both, 15 reps, Supine Quad Sets: AROM, Both, 10 reps, Supine Heel Slides: AAROM, Both, 10 reps, Supine Other Exercises Other Exercises: max cues and encouragement    General Comments General comments (skin integrity, edema, etc.): Pt on 6 L O2 rest with sats dropping to 86% just  with transfer to EOB (92% rest pre).  Increased to 8 L O2 with activity with sats at lowest 85% .  Provided cues for pt's throughout for pursed lip breathing.  At end of session, taking >5 mins to recover to 88-90% back on 6 L O2.  Tried cues for PLB , eventually just required therapist counting deep breaths and cues for each breath to increase to 88-90%.  Pt also with small skin tear on L forearm (suspect from bed rail, ID bracelet, etc) -skin very fragile.  Pt and spouse aware.  NOtified RN of skin tear and O2 status.      Pertinent Vitals/Pain Pain Assessment Pain Assessment: Faces Faces Pain Scale: Hurts little more Pain Location: Bil knees Pain Descriptors / Indicators: Grimacing Pain Intervention(s): Limited activity within patient's tolerance, Monitored during session, Repositioned    Home Living                          Prior Function            PT Goals (current goals can now be found in the care plan section) Acute Rehab PT Goals Potential to Achieve Goals: Good Progress towards PT goals: Progressing toward goals (limited)    Frequency    Min 3X/week      PT Plan      Co-evaluation PT/OT/SLP Co-Evaluation/Treatment: Yes Reason for Co-Treatment: For patient/therapist safety;Complexity of the patient's impairments (multi-system involvement);Other (comment) (pt's motivtion/won't tolerate both)          AM-PAC PT 6 Clicks Mobility   Outcome Measure  Help needed turning from your back to your side while in a flat bed without using bedrails?: A Lot Help needed moving from lying on your back to sitting on the side of a flat bed without using bedrails?: A Lot Help needed moving to and from a bed to a chair (including a wheelchair)?: Total Help needed standing up from a chair using your arms (e.g., wheelchair or bedside chair)?: Total Help needed to walk in hospital room?: Total Help needed climbing 3-5 steps with a railing? : Total 6 Click Score: 8     End of Session Equipment Utilized During Treatment: Gait belt Activity Tolerance: Patient limited by fatigue (fatigue, short of breath, anxious, self limiting) Patient left: in bed;with call bell/phone within reach;with family/visitor present (air mattress - all rails up) Nurse Communication: Mobility status PT Visit Diagnosis: Muscle weakness (generalized) (M62.81);Difficulty in walking, not elsewhere classified (R26.2)     Time: 1610-9604 PT Time Calculation (min) (ACUTE ONLY): 41 min  Charges:    $Therapeutic Exercise: 8-22 mins $Therapeutic Activity: 8-22 mins PT General Charges $$ ACUTE PT VISIT: 1 Visit                     Cyd Dowse, PT Acute Rehab Services [facility] [phone]    Carolynn Citrin  08/20/2023, 4:29 PM

## 2023-08-20 NOTE — Progress Notes (Signed)
 Occupational Therapy Treatment Patient Details Name: Morgan Odonnell MRN: [id number] DOB: 05-23-1950 Today's Date: 08/20/2023   History of present illness 73 year old female admitted on 08/15/23 for increased somnolence with acute on chronic hypoxic and hypercapnic respiratory failure. PMHx: OHS/OSA, CHRF on 3 L O2, restrictive lung disease secondary to obesity, morbid obesity, HFpEF, hypothyroidism, mood disorder, chronic pain, lymphedema, CHF, hypoventilation syndrome, heart murmur   OT comments   The pt was seen for progression of functional activity and general strengthening, needed to facilitate improved ADL performance. Pt noted to be on 6L O2 via high flow nasal cannula with O2 saturation being 92% at rest. Pt with O2 desaturation to 86% upon supine to sit, requiring an increase to 8L O2 via high flow nasal cannula. Pt reported lightheadedness and dizziness with activity. She further required cues for reassurance of abilities and for calming, as she presented with anxiousness. +2 mod assist was needed for pt to achieve standing, though her standing tolerance was very limited, as she requested the need to return to bed. Pt needed increased cues and instruction on implementing pursed lip breathing throughout, as well as several therapeutic rest breaks during the session. Further OT services are warranted to maximize the pt's occupational performance. Patient will benefit from continued inpatient follow up therapy, <3 hours/day.       If plan is discharge home, recommend the following:  Two people to help with walking and/or transfers;A lot of help with bathing/dressing/bathroom;Assistance with cooking/housework;Help with stairs or ramp for entrance   Equipment Recommendations  Other (comment) (bariatric bedside commode)    Recommendations for Other Services      Precautions / Restrictions Precautions Precautions: Fall Precaution/Restrictions Comments: Watch O2; Fragile  skin Restrictions Weight Bearing Restrictions Per Provider Order: No       Mobility Bed Mobility Overal bed mobility: Needs Assistance Bed Mobility: Supine to Sit, Sit to Supine     Supine to sit: Min assist, Used rails Sit to supine: Mod assist, +2 for physical assistance   General bed mobility comments: Pt moving to sit on EOB with min A for trunk but heavy use of rails and on air mattress (with low fricition).  Mod A x 2 for legs and trunk to return to bed and max x 2 to scoot up in bed. Patient Response: Anxious  Transfers Overall transfer level: Needs assistance Equipment used: 2 person hand held assist Transfers: Sit to/from Stand Sit to Stand: Mod assist, +2 physical assistance           General transfer comment: Pt wanting to return to supine but was near foot of bed.  Able to encourage/educate pt to perform stands/partial stands to move toward [facility]  Initial partial stand with min/mod A x2, 2 attempts not clearing buttock, and last attempt able to stand with assist of 2 briefly to scoot toward HOB.     Balance Overall balance assessment: Needs assistance     Sitting balance - Comments: leaning L needing at least single UE support; did sit EOB at least 10 mins with CGA-min A     Standing balance-Leahy Scale: Poor           ADL either performed or assessed with clinical judgement   ADL            Lower Body Dressing: Total assistance;Bed level Lower Body Dressing Details (indicate cue type and reason): Assist needed to donn and doff socks in bed  Communication Communication Communication: No apparent difficulties   Cognition Arousal: Alert Behavior During Therapy: Anxious Cognition: No apparent impairments          Following commands: Intact                  Pertinent Vitals/ Pain       Pain Assessment Pain Assessment: Faces Faces Pain Scale: Hurts little more Pain Location:  (knees with movement) Pain  Intervention(s): Limited activity within patient's tolerance, Monitored during session, Repositioned   Frequency  Min 2X/week        Progress Toward Goals  OT Goals(current goals can now be found in the care plan section)     Acute Rehab OT Goals OT Goal Formulation: With patient/family Time For Goal Achievement: 09/01/23 Potential to Achieve Goals: Fair  Plan      Co-evaluation    PT/OT/SLP Co-Evaluation/Treatment: Yes Reason for Co-Treatment: For patient/therapist safety;Complexity of the patient's impairments (multi-system involvement);Other (comment) PT goals addressed during session: Mobility/safety with mobility OT goals addressed during session: ADL's and self-care      AM-PAC OT 6 Clicks Daily Activity     Outcome Measure   Help from another person eating meals?: A Little Help from another person taking care of personal grooming?: A Little Help from another person toileting, which includes using toliet, bedpan, or urinal?: Total Help from another person bathing (including washing, rinsing, drying)?: A Lot Help from another person to put on and taking off regular upper body clothing?: A Lot Help from another person to put on and taking off regular lower body clothing?: Total 6 Click Score: 12    End of Session Equipment Utilized During Treatment: Oxygen   OT Visit Diagnosis: Unsteadiness on feet (R26.81);Muscle weakness (generalized) (M62.81);Pain;Other abnormalities of gait and mobility (R26.89) Pain - part of body:  (knees)   Activity Tolerance Patient limited by fatigue;Patient limited by pain   Patient Left in bed;with call bell/phone within reach;with bed alarm set;with family/visitor present   Nurse Communication Other (comment) (small skin tear on L forearm and pt requesting a chocolate Ensure)        Time: 1610-9604 OT Time Calculation (min): 38 min  Charges: OT General Charges $OT Visit: 1 Visit OT Treatments $Self Care/Home Management :  8-22 mins     Sheralyn Dies, OTR/L 08/20/2023, 5:04 PM

## 2023-08-21 DIAGNOSIS — I5033 Acute on chronic diastolic (congestive) heart failure: Secondary | ICD-10-CM | POA: Diagnosis not present

## 2023-08-21 LAB — CBC
HCT: 35.6 % — ABNORMAL LOW (ref 36.0–46.0)
Hemoglobin: 10.7 g/dL — ABNORMAL LOW (ref 12.0–15.0)
MCH: 27.6 pg (ref 26.0–34.0)
MCHC: 30.1 g/dL (ref 30.0–36.0)
MCV: 91.8 fL (ref 80.0–100.0)
Platelets: 188 10*3/uL (ref 150–400)
RBC: 3.88 MIL/uL (ref 3.87–5.11)
RDW: 17.4 % — ABNORMAL HIGH (ref 11.5–15.5)
WBC: 8.9 10*3/uL (ref 4.0–10.5)
nRBC: 0 % (ref 0.0–0.2)

## 2023-08-21 LAB — BASIC METABOLIC PANEL WITH GFR
Anion gap: 13 (ref 5–15)
BUN: 13 mg/dL (ref 8–23)
CO2: 37 mmol/L — ABNORMAL HIGH (ref 22–32)
Calcium: 9.2 mg/dL (ref 8.9–10.3)
Chloride: 81 mmol/L — ABNORMAL LOW (ref 98–111)
Creatinine, Ser: 0.62 mg/dL (ref 0.44–1.00)
GFR, Estimated: >60 mL/min (ref >60)
Glucose, Bld: 83 mg/dL (ref 70–99)
Potassium: 2.9 mmol/L — ABNORMAL LOW (ref 3.5–5.1)
Sodium: 131 mmol/L — ABNORMAL LOW (ref 135–145)

## 2023-08-21 LAB — MAGNESIUM: Magnesium: 1.8 mg/dL (ref 1.7–2.4)

## 2023-08-21 MED ORDER — PREGABALIN 25 MG PO CAPS
25.0000 mg | ORAL_CAPSULE | Freq: Two times a day (BID) | ORAL | Status: DC
  Administered 2023-08-21 – 2023-09-01 (×21): 25 mg via ORAL
  Filled 2023-08-21 (×21): qty 1

## 2023-08-21 MED ORDER — ENOXAPARIN SODIUM 100 MG/ML IJ SOSY: 1.0000 mg/kg | PREFILLED_SYRINGE | INTRAMUSCULAR | Status: DC

## 2023-08-21 MED ORDER — POTASSIUM CHLORIDE CRYS ER 20 MEQ PO TBCR
60.0000 meq | EXTENDED_RELEASE_TABLET | Freq: Once | ORAL | Status: AC
  Administered 2023-08-21: 60 meq via ORAL
  Filled 2023-08-21: qty 3

## 2023-08-21 MED ORDER — BACLOFEN 10 MG PO TABS
5.0000 mg | ORAL_TABLET | Freq: Three times a day (TID) | ORAL | Status: DC | PRN
  Administered 2023-08-26: 5 mg via ORAL
  Filled 2023-08-21: qty 1

## 2023-08-21 MED ORDER — ENOXAPARIN SODIUM 60 MG/0.6ML IJ SOSY
60.0000 mg | PREFILLED_SYRINGE | INTRAMUSCULAR | Status: DC
  Administered 2023-08-21 – 2023-08-31 (×11): 60 mg via SUBCUTANEOUS
  Filled 2023-08-21 (×11): qty 0.6

## 2023-08-21 MED ORDER — MAGNESIUM SULFATE IN D5W 1-5 GM/100ML-% IV SOLN
1.0000 g | Freq: Once | INTRAVENOUS | Status: AC
  Administered 2023-08-21: 1 g via INTRAVENOUS
  Filled 2023-08-21: qty 100

## 2023-08-21 NOTE — Progress Notes (Signed)
 Patient was encouraged t allow day shift RN to change BLE dressings. She request for dressing changes to be completed tonight.

## 2023-08-21 NOTE — Progress Notes (Signed)
 PROGRESS NOTE Morgan Odonnell  ZOX:[id number] DOB: 07-07-1950 DOA: 08/15/2023 PCP: Bertha Broad, MD  Brief Narrative/Hospital Course:  67 YOF w/ hx of OHS/OSA, CHRF on 3 L O2, restrictive lung disease secondary to obesity, morbid obesity, HFpEF, hypothyroidism, mood disorder, chronic pain presented with concern for increased somnolence at home. At time of interview patient is awake alert and fully oriented.  She reports that her husband has been more concerned because over the past few days she has been sleeping more often.She reports that she has had worsening shortness of breath, chest pressure at rest, cough productive with white sputum over the past week.  No fever, chills.  She has chronic lymphedema thinks that her legs have become more swollen.  She has chronic orthopnea.  She has previously been intolerant of noninvasive ventilation with sleep and does not use at home. In the ED: Labs showed BNP 821 bicarb 31 hemoglobin 9 previously 13 g, chest x-ray with basilar atelectasis RT>LT. Blood gases pH 7.4 PCO278 pO2 less than 30 compensated picture Patient admitted for increased somnolence with acute on chronic hypoxic and hypercapnic respiratory failure. Placed on nasal cannula oxygen , IV diuretics and CPAP and overall mentation has improved.   Subjective: Patient seen examined this morning Husband at the bedside again, Patient is awake interactive Overnight afebrile BP stable  Weight 0n admission 342 pound-295 likely false> weight down to 314 Still on 7 L [place] daytime, used CPAP overnight Labs with mild hyponatremia hypokalemia stable renal function   Assessment and plan:   Increased somnolence Acute on chronic hypoxic and hypercapnic respiratory - PTA on 3l [place] at rest on 4 L on ambulation OHS/OSA/Restrictive lung disease-no compliant with CPAP: At baseline w/ OSA/OHS and RLD and intolerant to noninvasive ventilation, admitted with increased somnolence acute on chronic hypoxia and  hypercapnia.  pH appears compensated although pco2 in 78 [place] stable, pco2 down to 65 6/9 am.She has chronic lymphedema.Viral etiology negative. Cont PT OT, incentive spirometry. Minimize sedatives Needs bipap, will reach out to [facility] as per previous hospitalist they are investigating that Continue at bedtime bipap   Acute on chronic HFpEF exacerbation Chronic lymphedema: Previously with EF 50% in 2020, on Lasix  40 twice daily PTA and has chronic lymphedema . BNP 821 up from prior. Chest x-ray with bibasilar atelectasis. Repeat echo: EF 50-50% no RWMA diastolic parameters indeterminate, severe mitral annular calcification AV tricuspid. S/p Metolazone x 1  on 6/9 with significant diuresis. Cont on iv Lasix  and oral metolazone as ordered and monitor intake output net negative balance.  Has put out 14.7 liters thus far   Anemia, normocytic, new onset: Last labs prior to admission was in 4/'24 with hemoglobin of 13.3. Currently holding the 9 g range workup shows iron deficiency, check Hemoccult If able. Will need close outpatient follow-up at this time and add iron supplementation.   Hypokalemia: Continue K+ supplementation, aggressive. Also monitor magnesium   Hypervolemic hyponatremia: Continue diuretics and monitor.   Hypothyroidism: TSH TFT slightly abnormal will need close follow-up and recheck   Mood disorder: Continue home citalopram    Deconditioning, debility Chronic pain: PTA on cyclobenzaprine, Lyrica , baclofen 1, percocet 5/325 mg  tid. Continue home meds but will decrease lyrica  dose from 75 to 25 and baclofen dose from 10 to 5   Super more obesity w/ Body mass index is 57.5 kg/m.: Will benefit with PCP follow-up, weight loss,healthy lifestyle and outpatient sleep eval if not done.   DVT prophylaxis:  lovenox  Code Status:  Code Status: Full Code Family Communication: plan of care discussed with patient and her husband at bedside. Patient status is: Remains  hospitalized because of severity of illness Level of care: Telemetry    Dispo: The patient is from: HOME W/ daughter.             Anticipated disposition: SNF.   Objective: Vitals last 24 hrs: Vitals:   08/21/23 0711 08/21/23 0819 08/21/23 1058 08/21/23 1534  BP:   135/61 (!) 138/59  Pulse:   (!) 103 100  Resp:   20 18  Temp:   98.7 F (37.1 C) 98.6 F (37 C)  TempSrc:   Oral Oral  SpO2:  90% 93% 95%  Weight: 135.6 kg     Height:        Physical Examination: General exam: alert awake, oriented HEENT:Oral mucosa moist,  Respiratory system: Bilaterally clear BS,no use of accessory muscle Cardiovascular system: S1 & S2 +,  Gastrointestinal system: Abdomen soft, obese, NT  Nervous System: Alert, awake, moving all extremities,and following commands. Extremities: LE edema present with lymphedema chronic hyperkeratotic skin changes Skin: No rashes,no icterus. MSK: Normal muscle bulk,tone, power   Data Reviewed: I have personally reviewed following labs and imaging studies ( see epic result tab) CBC: Recent Labs  Lab 08/16/23 0038 08/18/23 0355 08/19/23 0411 08/20/23 0412 08/21/23 0413  WBC 7.7 7.9 7.8 8.8 8.9  HGB 9.9* 9.0* 9.8* 9.9* 10.7*  HCT 33.5* 31.1* 33.1* 32.8* 35.6*  MCV 93.1 94.0 91.2 90.1 91.8  PLT 154 140* 148* 170 188   CMP: Recent Labs  Lab 08/16/23 0038 08/17/23 0745 08/17/23 1252 08/18/23 0355 08/19/23 0411 08/20/23 0412 08/21/23 0413  NA 137 135 135 135 134* 133* 131*  K 3.3* 3.7 4.7 3.7 3.7 3.3* 2.9*  CL 91* 92* 92* 90* 88* 83* 81*  CO2 39* 36* 37* 37* 39* 40* 37*  GLUCOSE 97 115* 106* 87 79 120* 83  BUN 14 11 11 12 10 12 13   CREATININE 0.57 0.55 0.44 0.49 0.66 0.67 0.62  CALCIUM  8.8* 8.5* 8.4* 8.5* 9.0 9.1 9.2  MG 1.9 2.0  --   --   --   --   --   PHOS 3.0  --   --   --   --   --   --    GFR: Estimated Creatinine Clearance: 85.8 mL/min (by C-G formula based on SCr of 0.62 mg/dL). Recent Labs  Lab 08/15/23 2036  AST 23  ALT 11   ALKPHOS 79  BILITOT 0.8  PROT 8.3*  ALBUMIN 2.6*   No results for input(s): LIPASE, AMYLASE in the last 168 hours. No results for input(s): AMMONIA in the last 168 hours. Coagulation Profile: No results for input(s): INR, PROTIME in the last 168 hours. Unresulted Labs (From admission, onward)     Start     Ordered   08/21/23 1621  Magnesium  Add-on,   AD       Question:  Specimen collection method  Answer:  Lab=Lab collect   08/21/23 1620   08/18/23 0833  Occult blood card to lab, stool  Once,   R        08/18/23 0832   08/18/23 0500  CBC  Daily,   R      08/17/23 0739   08/18/23 0500  Basic metabolic panel with GFR  Daily,   R      08/17/23 0801           Antimicrobials/Microbiology: Anti-infectives (  From admission, onward)    None      No results found for: SDES, SPECREQUEST, CULT, REPTSTATUS  Procedures:  Medications reviewed:  Scheduled Meds:  Chlorhexidine  Gluconate Cloth  6 each Topical Daily   citalopram   20 mg Oral Daily   ferrous sulfate  325 mg Oral BID WC   fluticasone  furoate-vilanterol  1 puff Inhalation Daily   furosemide   40 mg Intravenous BID   Gerhardt's butt cream   Topical BID   hydrocerin   Topical BID   levothyroxine   200 mcg Oral Q0600   metolazone  10 mg Oral Daily   mupirocin ointment  1 Application Nasal BID   nicotine   21 mg Transdermal Daily   mouth rinse  15 mL Mouth Rinse 4 times per day   potassium chloride   20 mEq Oral BID   potassium chloride   60 mEq Oral Once   pregabalin   75 mg Oral BID   sodium chloride  flush  3 mL Intravenous Q12H   umeclidinium bromide   1 puff Inhalation Daily   Continuous Infusions:  Raymonde Calico, MD [facility] 08/21/2023, 4:42 PM

## 2023-08-21 NOTE — TOC Progression Note (Signed)
 Transition of Care [facility]) - Progression Note   Patient Details  Name: Morgan Odonnell MRN: [id number] Date of Birth: [birth year]  Transition of Care [facility] CM/SW Contact  Zenon Hilda, LCSW Phone Number: 08/21/2023, 2:32 PM  Clinical Narrative: PT evaluation now recommending SNF and patient is agreeable. Patient still requiring 6L/min oxygen  and 8L while working with PT. Patient not yet medically ready to be faxed out for SNF. TOC to follow.  Expected Discharge Plan: Skilled Nursing Facility Barriers to Discharge: Continued Medical Work up  Expected Discharge Plan and Services In-house Referral: NA Living arrangements for the past 2 months: Single Family Home              DME Arranged: N/A DME Agency: NA HH Arranged: NA HH Agency: NA  Social Determinants of Health (SDOH) Interventions SDOH Screenings   Food Insecurity: No Food Insecurity (08/16/2023)  Housing: Low Risk  (08/16/2023)  Transportation Needs: No Transportation Needs (08/16/2023)  Utilities: Not At Risk (08/16/2023)  Depression (PHQ2-9): Medium Risk (01/13/2019)  Social Connections: Moderately Integrated (08/16/2023)  Tobacco Use: Medium Risk (08/15/2023)   Readmission Risk Interventions    08/17/2023    9:31 AM  Readmission Risk Prevention Plan  Transportation Screening Complete  PCP or Specialist Appt within 5-7 Days Complete  Home Care Screening Complete  Medication Review (RN CM) Complete

## 2023-08-22 DIAGNOSIS — J9621 Acute and chronic respiratory failure with hypoxia: Secondary | ICD-10-CM | POA: Diagnosis not present

## 2023-08-22 LAB — BASIC METABOLIC PANEL WITH GFR
Anion gap: 15 (ref 5–15)
Anion gap: 17 — ABNORMAL HIGH (ref 5–15)
BUN: 17 mg/dL (ref 8–23)
BUN: 21 mg/dL (ref 8–23)
CO2: 38 mmol/L — ABNORMAL HIGH (ref 22–32)
CO2: 35 mmol/L — ABNORMAL HIGH (ref 22–32)
Calcium: 9.7 mg/dL (ref 8.9–10.3)
Calcium: 9.4 mg/dL (ref 8.9–10.3)
Chloride: 76 mmol/L — ABNORMAL LOW (ref 98–111)
Chloride: 74 mmol/L — ABNORMAL LOW (ref 98–111)
Creatinine, Ser: 0.73 mg/dL (ref 0.44–1.00)
Creatinine, Ser: 0.88 mg/dL (ref 0.44–1.00)
GFR, Estimated: >60 mL/min (ref >60)
GFR, Estimated: >60 mL/min (ref >60)
Glucose, Bld: 89 mg/dL (ref 70–99)
Glucose, Bld: 118 mg/dL — ABNORMAL HIGH (ref 70–99)
Potassium: 2.8 mmol/L — ABNORMAL LOW (ref 3.5–5.1)
Potassium: 3.2 mmol/L — ABNORMAL LOW (ref 3.5–5.1)
Sodium: 129 mmol/L — ABNORMAL LOW (ref 135–145)
Sodium: 126 mmol/L — ABNORMAL LOW (ref 135–145)

## 2023-08-22 LAB — CBC
HCT: 37.3 % (ref 36.0–46.0)
Hemoglobin: 11.6 g/dL — ABNORMAL LOW (ref 12.0–15.0)
MCH: 27.4 pg (ref 26.0–34.0)
MCHC: 31.1 g/dL (ref 30.0–36.0)
MCV: 88.2 fL (ref 80.0–100.0)
Platelets: 209 10*3/uL (ref 150–400)
RBC: 4.23 MIL/uL (ref 3.87–5.11)
RDW: 18 % — ABNORMAL HIGH (ref 11.5–15.5)
WBC: 10.9 10*3/uL — ABNORMAL HIGH (ref 4.0–10.5)
nRBC: 0 % (ref 0.0–0.2)

## 2023-08-22 LAB — MAGNESIUM: Magnesium: 1.9 mg/dL (ref 1.7–2.4)

## 2023-08-22 MED ORDER — POTASSIUM CHLORIDE CRYS ER 20 MEQ PO TBCR
60.0000 meq | EXTENDED_RELEASE_TABLET | Freq: Once | ORAL | Status: AC
  Administered 2023-08-22: 60 meq via ORAL
  Filled 2023-08-22: qty 3

## 2023-08-22 NOTE — NC FL2 (Signed)
 [place]  MEDICAID FL2 LEVEL OF CARE FORM     IDENTIFICATION  Patient Name: Morgan Odonnell Birthdate: [birth year] Sex: female Admission Date (Current Location): 08/15/2023  [facility] and [place] Number:  Producer, television/film/video and Address:  [facility], [address] [place], [place] [address]      Provider Number: [id number]  Attending Physician Name and Address:  Volney Grumbles, MD  Relative Name and Phone Number:  Theresia, Pree (Spouse)  [phone] [facility])    Current Level of Care: Hospital Recommended Level of Care: Skilled Nursing Facility Prior Approval Number:    Date Approved/Denied:   PASRR Number: [id number]  Discharge Plan: SNF    Current Diagnoses: Patient Active Problem List   Diagnosis Date Noted   Normocytic anemia 08/16/2023   Heart failure (HCC) 08/15/2023   Precordial pain 02/21/2021   Exertional dyspnea 02/21/2021   Acute on chronic respiratory failure with hypoxia and hypercapnia (HCC) 06/27/2017   Hyperkalemia 06/27/2017   Acute renal failure superimposed on stage 2 chronic kidney disease (HCC) 06/27/2017   Restrictive lung disease 12/13/2015   Obesity hypoventilation syndrome (HCC) 05/10/2015   Chronic respiratory failure (HCC) 05/10/2015   Morbid obesity with BMI of 50.0-59.9, adult (HCC) 10/18/2013   Class 3 severe obesity with serious comorbidity and body mass index (BMI) of 60.0 to 69.9 in adult 10/18/2013   Lymphedema 01/26/2013   Chronic diastolic CHF (congestive heart failure) (HCC) 01/04/2013   Chronic cough    COPD (chronic obstructive pulmonary disease)Gold C    Snoring    Low back pain    Hypertension     Orientation RESPIRATION BLADDER Height & Weight     Time, Self, Situation, Place  O2 (6L) Incontinent, External catheter Weight: 288 lb 5.8 oz (130.8 kg) Height:  5' 2 (157.5 cm)  BEHAVIORAL SYMPTOMS/MOOD NEUROLOGICAL BOWEL NUTRITION STATUS      Continent Diet  AMBULATORY STATUS  COMMUNICATION OF NEEDS Skin   Limited Assist   Other (Comment) (see d/c summary)                       Personal Care Assistance Level of Assistance  Bathing, Feeding, Dressing Bathing Assistance: Limited assistance Feeding assistance: Independent Dressing Assistance: Limited assistance     Functional Limitations Info  Sight, Hearing, Speech Sight Info: Adequate Hearing Info: Adequate Speech Info: Adequate    SPECIAL CARE FACTORS FREQUENCY  PT (By licensed PT), OT (By licensed OT)     PT Frequency: 5x a week OT Frequency: 5 x a week            Contractures Contractures Info: Not present    Additional Factors Info  Code Status, Allergies Code Status Info: full Allergies Info: Gabapentin,Novocain (procaine)           Current Medications (08/22/2023):  This is the current hospital active medication list Current Facility-Administered Medications  Medication Dose Route Frequency Provider Last Rate Last Admin   acetaminophen  (TYLENOL ) tablet 1,000 mg  1,000 mg Oral Q6H PRN Arnulfo Larch, MD   1,000 mg at 08/20/23 0941   alum & mag hydroxide-simeth (MAALOX/MYLANTA) 200-200-20 MG/5ML suspension 15 mL  15 mL Oral Q4H PRN [name] Lew, MD       baclofen  (LIORESAL ) tablet 5 mg  5 mg Oral TID PRN Wouk, Haynes Lips, MD       Chlorhexidine  Gluconate Cloth 2 % PADS 6 each  6 each Topical Daily Lesa Rape, MD  6 each at 08/19/23 1709   citalopram  (CELEXA ) tablet 20 mg  20 mg Oral Daily Segars, Jonathan, MD   20 mg at 08/22/23 0836   enoxaparin  (LOVENOX ) injection 60 mg  60 mg Subcutaneous Q24H Janeane Mealy, MD   60 mg at 08/21/23 2120   ferrous sulfate  tablet 325 mg  325 mg Oral BID WC Kc, Lurlene Salon, MD   325 mg at 08/22/23 0836   fluticasone  furoate-vilanterol (BREO ELLIPTA ) 200-25 MCG/ACT 1 puff  1 puff Inhalation Daily Segars, Jonathan, MD   1 puff at 08/22/23 0834   furosemide  (LASIX ) injection 40 mg  40 mg Intravenous BID Segars, Jonathan, MD   40 mg at  08/22/23 1240   Gerhardt's butt cream   Topical BID [name] Lew, MD   Given at 08/22/23 0981   hydrocerin (EUCERIN) cream   Topical BID [name] Lew, MD   Given at 08/22/23 [phone]   ipratropium-albuterol  (DUONEB) 0.5-2.5 (3) MG/3ML nebulizer solution 3 mL  3 mL Nebulization Q6H PRN Arnulfo Larch, MD   3 mL at 08/18/23 1448   levothyroxine  (SYNTHROID ) tablet 200 mcg  200 mcg Oral Q0600 Segars, Jonathan, MD   200 mcg at 08/22/23 0506   melatonin tablet 6 mg  6 mg Oral QHS PRN Segars, Jonathan, MD   6 mg at 08/16/23 0252   metolazone  (ZAROXOLYN ) tablet 10 mg  10 mg Oral Daily Babcock, Peter E, NP   10 mg at 08/22/23 7829   mupirocin  ointment (BACTROBAN ) 2 % 1 Application  1 Application Nasal BID Lesa Rape, MD   1 Application at 08/22/23 0841   nicotine  (NICODERM CQ  - dosed in mg/24 hours) patch 21 mg  21 mg Transdermal Daily Dalene Duck, MD   21 mg at 08/22/23 5621   Oral care mouth rinse  15 mL Mouth Rinse 4 times per day Arnulfo Larch, MD   15 mL at 08/22/23 1153   Oral care mouth rinse  15 mL Mouth Rinse PRN Segars, Arlyce Lambert, MD       oxyCODONE -acetaminophen  (PERCOCET/ROXICET) 5-325 MG per tablet 1 tablet  1 tablet Oral Q8H PRN Segars, Jonathan, MD   1 tablet at 08/22/23 0836   polyethylene glycol (MIRALAX  / GLYCOLAX ) packet 17 g  17 g Oral Daily PRN Segars, Jonathan, MD   17 g at 08/22/23 3086   potassium chloride  SA (KLOR-CON  M) CR tablet 20 mEq  20 mEq Oral BID [name] Lew, MD   20 mEq at 08/22/23 5784   pregabalin  (LYRICA ) capsule 25 mg  25 mg Oral BID Janeane Mealy, MD   25 mg at 08/22/23 6962   propranolol  (INDERAL ) tablet 10 mg  10 mg Oral BID PRN Arnulfo Larch, MD       sodium chloride  flush (NS) 0.9 % injection 3 mL  3 mL Intravenous Q12H Segars, Jonathan, MD   3 mL at 08/22/23 0841   umeclidinium bromide  (INCRUSE ELLIPTA ) 62.5 MCG/ACT 1 puff  1 puff Inhalation Daily Segars, Jonathan, MD   1 puff at 08/22/23 9528     Discharge  Medications: Please see discharge summary for a list of discharge medications.  Relevant Imaging Results:  Relevant Lab Results:   Additional Information SSN:[id number]  Zenon Hilda, LCSW

## 2023-08-22 NOTE — Progress Notes (Signed)
 Physical Therapy Treatment Patient Details Name: Morgan Odonnell MRN: [id number] DOB: 21-Feb-1951 Today's Date: 08/22/2023   History of Present Illness 73 year old female admitted on 08/15/23 for increased somnolence with acute on chronic hypoxic and hypercapnic respiratory failure. PMHx: OHS/OSA, CHRF on 3 L O2, restrictive lung disease secondary to obesity, morbid obesity, HFpEF, hypothyroidism, mood disorder, chronic pain, lymphedema, CHF, hypoventilation syndrome, heart murmur    PT Comments  Pt on bed pan on arrival and c/o having constipation.  Pt encouraged to use BSC and she was agreeable.  Pt able to perform bed mobility and then transfer to [facility] with only min assist today. Pt requested time on [facility] so left with call bell and spouse also present.  Pt agreeable to use call bell. RN also aware (present for session).  Current d/c plan for SNF.  If pt prefers home, need to encourage more BSC use and OOB activity from staff.  Recliner set up for pt, and pt agreeable to transfer to recliner once finished with BSC (and calling for assist).    If plan is discharge home, recommend the following: A lot of help with walking and/or transfers;A lot of help with bathing/dressing/bathroom;Assistance with cooking/housework;Assist for transportation;Help with stairs or ramp for entrance   Can travel by private vehicle     No  Equipment Recommendations  None recommended by PT    Recommendations for Other Services       Precautions / Restrictions Precautions Precautions: Fall Precaution/Restrictions Comments: Fragile skin     Mobility  Bed Mobility Overal bed mobility: Needs Assistance Bed Mobility: Supine to Sit     Supine to sit: Min assist, Used rails     General bed mobility comments: pt able to move LEs over EOB, light assist for upper body, reliant on rail    Transfers Overall transfer level: Needs assistance Equipment used: None Transfers: Sit to/from Stand Sit to Stand: Min  assist   Step pivot transfers: Min assist       General transfer comment: provided BSC cornered to bed so pt could utilized armrests; light assist to stabilize with transitioning hands; remained on 6L O2 HFNC; pt does not reports issues with breathing during transfer    Ambulation/Gait                   Stairs             Wheelchair Mobility     Tilt Bed    Modified Rankin (Stroke Patients Only)       Balance                                            Communication Communication Communication: No apparent difficulties  Cognition Arousal: Alert Behavior During Therapy: WFL for tasks assessed/performed   PT - Cognitive impairments: Problem solving                         Following commands: Intact      Cueing    Exercises      General Comments        Pertinent Vitals/Pain Pain Assessment Pain Assessment: No/denies pain    Home Living                          Prior Function  PT Goals (current goals can now be found in the care plan section) Progress towards PT goals: Progressing toward goals    Frequency    Min 3X/week      PT Plan      Co-evaluation              AM-PAC PT 6 Clicks Mobility   Outcome Measure  Help needed turning from your back to your side while in a flat bed without using bedrails?: A Lot Help needed moving from lying on your back to sitting on the side of a flat bed without using bedrails?: A Lot Help needed moving to and from a bed to a chair (including a wheelchair)?: A Little Help needed standing up from a chair using your arms (e.g., wheelchair or bedside chair)?: A Little Help needed to walk in hospital room?: A Lot Help needed climbing 3-5 steps with a railing? : Total 6 Click Score: 13    End of Session   Activity Tolerance: Patient tolerated treatment well Patient left: Other (comment);with call bell/phone within reach;with  family/visitor present (on [facility] per request) Nurse Communication: Mobility status PT Visit Diagnosis: Muscle weakness (generalized) (M62.81);Difficulty in walking, not elsewhere classified (R26.2)     Time: 9528-4132 PT Time Calculation (min) (ACUTE ONLY): 11 min  Charges:    $Therapeutic Activity: 8-22 mins PT General Charges $$ ACUTE PT VISIT: 1 Visit                     Blanch Bunde, DPT Physical Therapist Acute Rehabilitation Services Office: [phone]  Myna Asal Payson 08/22/2023, 5:01 PM

## 2023-08-22 NOTE — Progress Notes (Signed)
   08/22/23 2244  BiPAP/CPAP/SIPAP  BiPAP/CPAP/SIPAP Pt Type Adult  BiPAP/CPAP/SIPAP DREAMSTATIOND  Mask Type Full face mask  Dentures removed? Not applicable  Mask Size Medium  Respiratory Rate 20 breaths/min  IPAP 18 cmH20  EPAP 8 cmH2O  Flow Rate 7 lpm  Patient Home Machine No  Patient Home Mask No  Patient Home Tubing No  Auto Titrate No  CPAP/SIPAP surface wiped down Yes  Device Plugged into RED Power Outlet Yes  BiPAP/CPAP /SiPAP Vitals  Pulse Rate 99  Resp 20  SpO2 96 %  MEWS Score/Color  MEWS Score 0  MEWS Score Color Marrie Sizer

## 2023-08-22 NOTE — Progress Notes (Signed)
 Progress Note   Patient: Morgan Odonnell ZOX:[id number] DOB: 25-Apr-1950 DOA: 08/15/2023     7 DOS: the patient was seen and examined on 08/22/2023   Brief hospital course: 12 YOF w/ hx of OHS/OSA, CHRF on 3 L O2, restrictive lung disease secondary to obesity, morbid obesity, HFpEF, hypothyroidism, mood disorder, chronic pain presented with concern for increased somnolence at home. At time of interview patient is awake alert and fully oriented.  She reports that her husband has been more concerned because over the past few days she has been sleeping more often.She reports that she has had worsening shortness of breath, chest pressure at rest, cough productive with white sputum over the past week.  No fever, chills.  She has chronic lymphedema thinks that her legs have become more swollen.  She has chronic orthopnea.  She has previously been intolerant of noninvasive ventilation with sleep and does not use at home. In the ED: Labs showed BNP 821 bicarb 31 hemoglobin 9 previously 13 g, chest x-ray with basilar atelectasis RT>LT. Blood gases pH 7.4 PCO278 pO2 less than 30 compensated picture Patient admitted for increased somnolence with acute on chronic hypoxic and hypercapnic respiratory failure. Placed on nasal cannula oxygen , IV diuretics and CPAP and overall mentation has improved.  Subjective: Patient seen examined this morning, husband at bedside.  Patient complains of constipation reports abdominal discomfort as a result.Husband at the bedside again, Patient is awake interactive Overnight afebrile BP stable     Assessment and plan:  Increased somnolence Acute on chronic hypoxic and hypercapnic respiratory - PTA on 3l [place] at rest on 4 L on ambulation OHS/OSA/Restrictive lung disease-no compliant with CPAP: At baseline w/ OSA/OHS and RLD and intolerant to noninvasive ventilation, admitted with increased somnolence acute on chronic hypoxia and hypercapnia.  pH appears compensated although  pco2 in 78 [place] stable, pco2 down to 65 6/9 am.She has chronic lymphedema.Viral etiology negative. Cont PT OT, incentive spirometry. Minimize sedatives she may be willing to try CPAP this time  Informed TOC, reaching  out to [employer]  to see if CPAP  or BIPAP can be approved. I have reached out  PCCM team Pete to assist-she is followed by Dr. Marygrace Snellen   Acute on chronic HFpEF exacerbation Chronic lymphedema: Previously with EF 50% in 2020, on Lasix  40 twice daily PTA and has chronic lymphedema . BNP 821 up from prior. Chest x-ray with bibasilar atelectasis. Repeat echo: EF 50-50% no RWMA diastolic parameters indeterminate, severe mitral annular calcification AV tricuspid. S/p Metolazone  x 1  on 6/9 with significant diuresis. Cont on iv Lasix  as ordered and monitor intake output net negative balance. Wt on admit 342 lb and 295 lb on 6/7-?false> wt down to 314 Cont to monitor daily I/O,weight, electrolytes.Cont salt/fluid restricted diet and monitor in tele. Net IO Since Admission: -9,837 mL [08/20/23 0857]  Filed Weights   08/18/23 0335 08/19/23 0500 08/20/23 0500  Weight: (!) 149.6 kg (!) 146.9 kg (!) 142.6 kg    Recent Labs  Lab 08/15/23 2036 08/16/23 0038 08/17/23 0745 08/17/23 1252 08/18/23 0355 08/19/23 0411 08/20/23 0412  BNP 821.9*  --   --   --   --   --   --   BUN 15 14 11 11 12 10 12   CREATININE 0.69 0.57 0.55 0.44 0.49 0.66 0.67  K 3.5 3.3* 3.7 4.7 3.7 3.7 3.3*  MG  --  1.9 2.0  --   --   --   --  Anemia, normocytic, new onset: Last labs prior to admission was in 4/'24 with hemoglobin of 13.3. Currently holding the 9 g range workup shows iron deficiency, check Hemoccult If able. Will need close outpatient follow-up at this time and add iron supplementation. Recent Labs  Lab 08/15/23 2036 08/16/23 0038 08/18/23 0355 08/19/23 0411 08/20/23 0412  HGB 9.9* 9.9* 9.0* 9.8* 9.9*  HCT 33.9* 33.5* 31.1* 33.1* 32.8*   Severe Hypokalemia: 2.8 Continue K+  supplementation  Hypervolemic hyponatremia: Continue diuretics and monitor.  Hypothyroidism: TSH TFT slightly abnormal will need close follow-up and recheck  Mood disorder: Continue home citalopram   Deconditioning, debility Chronic pain: PTA on cyclobenzaprine, Lyrica , baclofen  1, percocet 5/325 mg  tid. Continue home meds cautiously avoid sedation and hypercapnia. Continue PT OT, will benefit with skilled nursing facility but she seems reluctant.         Physical Exam: Vitals:   08/21/23 1534 08/21/23 2018 08/22/23 0454 08/22/23 0703  BP: (!) 138/59 136/67 (!) 154/67   Pulse: 100 97 (!) 106   Resp: 18 20    Temp: 98.6 F (37 C) 98.4 F (36.9 C) 98.7 F (37.1 C)   TempSrc: Oral Oral Oral   SpO2: 95% 95% 94%   Weight:    130.8 kg  Height:      General exam: obese, alert awake, oriented HEENT:Oral mucosa moist,  Respiratory system: Bilaterally clear BS,no use of accessory muscle Cardiovascular system: S1 & S2 +,  Gastrointestinal system: Abdomen soft, obese, NT  Nervous System: Alert, awake, moving all extremities,and following commands. Extremities: LE edema present with lymphedema chronic hyperkeratotic skin changes Skin: No rashes,no icterus. MSK: Normal muscle bulk,tone, power  Data Reviewed:  Data Reviewed: I have personally reviewed following labs and imaging studies ( see epic result tab) CBC: Last Labs         Recent Labs  Lab 08/16/23 0038 08/18/23 0355 08/19/23 0411 08/20/23 0412 08/21/23 0413  WBC 7.7 7.9 7.8 8.8 8.9  HGB 9.9* 9.0* 9.8* 9.9* 10.7*  HCT 33.5* 31.1* 33.1* 32.8* 35.6*  MCV 93.1 94.0 91.2 90.1 91.8  PLT 154 140* 148* 170 188      CMP: Last Labs           Recent Labs  Lab 08/16/23 0038 08/17/23 0745 08/17/23 1252 08/18/23 0355 08/19/23 0411 08/20/23 0412 08/21/23 0413  NA 137 135 135 135 134* 133* 131*  K 3.3* 3.7 4.7 3.7 3.7 3.3* 2.9*  CL 91* 92* 92* 90* 88* 83* 81*  CO2 39* 36* 37* 37* 39* 40* 37*  GLUCOSE 97  115* 106* 87 79 120* 83  BUN 14 11 11 12 10 12 13   CREATININE 0.57 0.55 0.44 0.49 0.66 0.67 0.62  CALCIUM  8.8* 8.5* 8.4* 8.5* 9.0 9.1 9.2  MG 1.9 2.0  --   --   --   --   --   PHOS 3.0  --   --   --   --   --   --      GFR: Estimated Creatinine Clearance: 85.8 mL/min (by C-G formula based on SCr of 0.62 mg/dL). Last Labs     Recent Labs  Lab 08/15/23 2036  AST 23  ALT 11  ALKPHOS 79  BILITOT 0.8  PROT 8.3*  ALBUMIN 2.6*      Last Labs  No results for input(s): LIPASE, AMYLASE in the last 168 hours.    Last Labs  No results for input(s): AMMONIA in the last 168 hours.  Coagulation Profile Family Communication: HUSBAND  Disposition: Status is: Inpatient Remains inpatient appropriate  Planned Discharge Destination: Skilled nursing facility    Time spent: 35 minutes  Author: Volney Grumbles, MD 08/22/2023 12:13 PM  For on call review [email or web address].

## 2023-08-22 NOTE — Progress Notes (Signed)
   08/22/23 0003  BiPAP/CPAP/SIPAP  BiPAP/CPAP/SIPAP Pt Type Adult  Reason BIPAP/CPAP not in use Other(comment) (Pt refused)

## 2023-08-22 NOTE — TOC Progression Note (Signed)
 Transition of Care [facility]) - Progression Note   Patient Details  Name: Morgan Odonnell MRN: [id number] Date of Birth: [birth year]  [facility] CM/SW Contact  Zenon Hilda, LCSW Phone Number: 08/22/2023, 1:43 PM  Clinical Narrative: Patient faxed out for SNF. CSW followed up with Mitch with [employer] regarding BiPAP/NIV. Per Harriet Limber, [employer] can set up patient for home BiPAP/NIV, but not for SNF so the order will have to be completed on the day of discharge so that it is valid for 30 days which will allow set up when the patient returns home. [employer] will not be able to provide a BiPAP/NIV in a SNF, so patient will need to be accepted to a SNF that can provide BiPAP/NIV. TOC awaiting SNF bed offers.  Expected Discharge Plan: Skilled Nursing Facility Barriers to Discharge: Continued Medical Work up  Expected Discharge Plan and Services In-house Referral: NA Living arrangements for the past 2 months: Single Family Home             DME Arranged: N/A DME Agency: NA HH Arranged: NA HH Agency: NA  Social Determinants of Health (SDOH) Interventions SDOH Screenings   Food Insecurity: No Food Insecurity (08/16/2023)  Housing: Low Risk  (08/16/2023)  Transportation Needs: No Transportation Needs (08/16/2023)  Utilities: Not At Risk (08/16/2023)  Depression (PHQ2-9): Medium Risk (01/13/2019)  Social Connections: Moderately Integrated (08/16/2023)  Tobacco Use: Medium Risk (08/15/2023)   Readmission Risk Interventions    08/17/2023    9:31 AM  Readmission Risk Prevention Plan  Transportation Screening Complete  PCP or Specialist Appt within 5-7 Days Complete  Home Care Screening Complete  Medication Review (RN CM) Complete

## 2023-08-23 DIAGNOSIS — J449 Chronic obstructive pulmonary disease, unspecified: Secondary | ICD-10-CM | POA: Diagnosis not present

## 2023-08-23 DIAGNOSIS — J9621 Acute and chronic respiratory failure with hypoxia: Secondary | ICD-10-CM | POA: Diagnosis not present

## 2023-08-23 DIAGNOSIS — J9622 Acute and chronic respiratory failure with hypercapnia: Secondary | ICD-10-CM | POA: Diagnosis not present

## 2023-08-23 DIAGNOSIS — E662 Morbid (severe) obesity with alveolar hypoventilation: Secondary | ICD-10-CM | POA: Diagnosis not present

## 2023-08-23 MED ORDER — POTASSIUM CHLORIDE CRYS ER 20 MEQ PO TBCR
40.0000 meq | EXTENDED_RELEASE_TABLET | Freq: Once | ORAL | Status: AC
  Administered 2023-08-23: 40 meq via ORAL
  Filled 2023-08-23: qty 2

## 2023-08-23 NOTE — Progress Notes (Signed)
   08/23/23 2210  BiPAP/CPAP/SIPAP  BiPAP/CPAP/SIPAP Pt Type Adult  BiPAP/CPAP/SIPAP DREAMSTATIOND  Mask Type Full face mask  Dentures removed? Not applicable  Mask Size Medium  Respiratory Rate 20 breaths/min  IPAP 18 cmH20  EPAP 8 cmH2O  Flow Rate 6 lpm  Patient Home Machine No  Patient Home Mask No  Patient Home Tubing No  Auto Titrate No  CPAP/SIPAP surface wiped down Yes  Device Plugged into RED Power Outlet Yes  BiPAP/CPAP /SiPAP Vitals  Pulse Rate 95  Resp 20  SpO2 93 %  Bilateral Breath Sounds Diminished  MEWS Score/Color  MEWS Score 0  MEWS Score Color Marrie Sizer

## 2023-08-23 NOTE — Plan of Care (Signed)

## 2023-08-23 NOTE — Progress Notes (Signed)
 Occupational Therapy Treatment Patient Details Name: Morgan Odonnell MRN: [id number] DOB: 10/06/1950 Today's Date: 08/23/2023   History of present illness 73 year old female admitted on 08/15/23 for increased somnolence with acute on chronic hypoxic and hypercapnic respiratory failure. PMHx: OHS/OSA, CHRF on 3 L O2, restrictive lung disease secondary to obesity, morbid obesity, HFpEF, hypothyroidism, mood disorder, chronic pain, lymphedema, CHF, hypoventilation syndrome, heart murmur   OT comments  The pt was seen for progression of functional activity and ADL instruction. Pt required min assist to transfer into sitting EOB, then total assist to don her socks seated EOB. Pt stood from EOB with min assist using a RW for support. OT further instructed the pt on implementing a stand-pivot transfer to the bedside commode for anticipation of instruction on toileting management. Pt was unable safely progress to the bedside commode for toileting (see general comment below). At current, she is a high falls risk. Without further OT services, she is also at risk restricted ADL participation and further weakness and deconditioning. Continue OT plan of care. Patient will benefit from continued inpatient follow up therapy, <3 hours/day.       If plan is discharge home, recommend the following:  A lot of help with bathing/dressing/bathroom;Assistance with cooking/housework;Help with stairs or ramp for entrance;A little help with walking and/or transfers;A lot of help with walking and/or transfers   Equipment Recommendations  Other (comment) (defer to next level of care)    Recommendations for Other Services      Precautions / Restrictions Precautions Precautions: Fall Restrictions Weight Bearing Restrictions Per Provider Order: No       Mobility Bed Mobility Overal bed mobility: Needs Assistance Bed Mobility: Supine to Sit     Supine to sit: Min assist, Used rails           Transfers Overall transfer level: Needs assistance Equipment used: Rolling walker (2 wheels) Transfers: Sit to/from Stand Sit to Stand: Min assist           General transfer comment: Halfway through stand-pivot transfer to bedside commode, pt presented with feeling very shaky. Pt was then with increased anxiousness and fearfulness of falling, requiring increased cues for calming and reassurance. Pt unable to safely complete stand-pivot transfer to commode or back to bed, therefore OT needed to assist pt with gradual, controlled descent down into upright sitting on the floor.     Balance     Sitting balance-Leahy Scale: Good       Standing balance-Leahy Scale: Poor              ADL either performed or assessed with clinical judgement   ADL Overall ADL's : Needs assistance/impaired          Lower Body Dressing: Total assistance;Sitting/lateral leans Lower Body Dressing Details (indicate cue type and reason): The pt required total assist to donn her socks seated EOB.                              Communication Communication Communication: No apparent difficulties   Cognition Arousal: Alert Behavior During Therapy: WFL for tasks assessed/performed Cognition: No apparent impairments          Following commands: Intact        Cueing     Exercises   Shoulder Instructions   General Comments     Pertinent Vitals/ Pain       Pain Assessment Pain Assessment: Faces Pain Score: 3  Pain Location: B LE with bed mobility Pain Intervention(s): Monitored during session   Frequency  Min 2X/week        Progress Toward Goals  OT Goals(current goals can now be found in the care plan section)     Acute Rehab OT Goals OT Goal Formulation: With patient Time For Goal Achievement: 09/01/23 Potential to Achieve Goals: Fair  Plan         AM-PAC OT 6 Clicks Daily Activity     Outcome Measure   Help from another person eating meals?:  None Help from another person taking care of personal grooming?: A Little Help from another person toileting, which includes using toliet, bedpan, or urinal?: Total Help from another person bathing (including washing, rinsing, drying)?: A Lot Help from another person to put on and taking off regular upper body clothing?: A Little Help from another person to put on and taking off regular lower body clothing?: Total 6 Click Score: 14    End of Session Equipment Utilized During Treatment: Gait belt;Rolling walker (2 wheels);Oxygen   OT Visit Diagnosis: Unsteadiness on feet (R26.81);Muscle weakness (generalized) (M62.81);Pain;Other abnormalities of gait and mobility (R26.89) Pain - part of body:  (B LE)   Activity Tolerance Other (comment) (Limited by anxiousness)   Patient Left in bed;with call bell/phone within reach;with bed alarm set;with family/visitor present   Nurse Communication Other (comment) (Nurse informed of safety event)        Time: 9562-1308 OT Time Calculation (min): 29 min  Charges: OT General Charges $OT Visit: 1 Visit OT Treatments $Therapeutic Activity: 8-22 mins      Sheralyn Dies, OTR/L 08/23/2023, 5:34 PM

## 2023-08-23 NOTE — Progress Notes (Signed)
   08/23/23 1620  Provider Notification  Provider Name/Title Dibia, MD  Date Provider Notified 08/23/23  Time Provider Notified 1621  Method of Notification Page  Notification Reason Other (Comment) (Pt had witnessed fall with OT. Pt denies hitting head. VSS and pt denies any new pain. C/o chronic left ankle pain. Pt back in bed. Given Tyleno (see MAR).)  Provider response No new orders

## 2023-08-23 NOTE — Progress Notes (Signed)
   08/23/23 1600  What Happened  Was fall witnessed? Yes  Who witnessed fall? OT  Patients activity before fall during therapy  Point of contact buttocks  Was patient injured? No  Provider Notification  Provider Name/Title Dibia  Date Provider Notified 08/23/23  Time Provider Notified 1618  Method of Notification Page  Notification Reason  (Fall)  Follow Up  Family notified Yes - comment  Time family notified 1618  Additional tests No  Simple treatment Other (comment) (none needed)  Progress note created (see row info) Yes  Adult Fall Risk Assessment  Risk Factor Category (scoring not indicated) High fall risk per protocol (document High fall risk)  Age 73  Fall History: Fall within 6 months prior to admission 0  Elimination; Bowel and/or Urine Incontinence 2  Elimination; Bowel and/or Urine Urgency/Frequency 0  Medications: includes PCA/Opiates, Anti-convulsants, Anti-hypertensives, Diuretics, Hypnotics, Laxatives, Sedatives, and Psychotropics 5  Patient Care Equipment 2  Mobility-Assistance 2  Mobility-Gait 2  Mobility-Sensory Deficit 0  Altered awareness of immediate physical environment 0  Impulsiveness 0  Lack of understanding of one's physical/cognitive limitations 0  Total Score 15  Patient Fall Risk Level High fall risk  Adult Fall Risk Interventions  Required Bundle Interventions *See Row Information* High fall risk - low, moderate, and high requirements implemented  Additional Interventions Family Supervision;Use of appropriate toileting equipment (bedpan, BSC, etc.)  Screening for Fall Injury Risk (To be completed on HIGH fall risk patients) - Assessing Need for Floor Mats  Risk For Fall Injury- Criteria for Floor Mats None identified - No additional interventions needed  Pain Assessment  Pain Scale 0-10  Pain Score 6  Pain Type Acute pain  Pain Location Ankle  Pain Orientation Right  Pain Descriptors / Indicators Aching  Pain Frequency Occasional  Pain  Onset On-going (not new)  Patients Stated Pain Goal 3  Pain Intervention(s) Medication (See eMAR)  Multiple Pain Sites No  PCA/Epidural/Spinal Assessment  Respiratory Pattern Regular;Unlabored  Neurological  Neuro (WDL) X  Level of Consciousness Alert  Orientation Level Oriented X4  Cognition Follows commands  Speech Clear  Neuro Symptoms Forgetful  Glasgow Coma Scale  Eye Opening 4  Best Verbal Response (NON-intubated) 5  Best Motor Response 6  Glasgow Coma Scale Score 15  Musculoskeletal  Musculoskeletal (WDL) X  Assistive Device None  Generalized Weakness Yes  Weight Bearing Restrictions Per Provider Order No  Musculoskeletal Details  RLE Limited movement  LLE Limited movement  Right Foot Swelling;Weakness  Left Foot Swelling;Weakness  Integumentary  Integumentary (WDL) X (no changes to skin)  Pain Assessment  Date Pain First Started 08/15/23 (Chronic pain)  Result of Injury No  Pain Assessment  Work-Related Injury No

## 2023-08-23 NOTE — Progress Notes (Signed)
 NAME:  Morgan Odonnell, MRN:  [id number], DOB:  07/17/1950, LOS: 8 ADMISSION DATE:  08/15/2023, CONSULTATION DATE:  08/20/2023 REFERRING MD:  Lesa Rape, CHIEF COMPLAINT: Acute on chronic hypercapnic respiratory failure  History of Present Illness:  73 year old female w/ sig h/o tobacco abuse, obesity, OHS/OSA COPD GOLD C and chronic hypoxic and Hypercarbic resp failure.  Admitted 6/6 w/ decreased MS and hypersomnolence Baseline serum bicarb 38, BNP elevated 800, congestion on CXR,  and VBG w/ PCO2 78, bicarb 51 and ph 7.42 Admitted w/ working dx of acute diastolic HF  Therapeutic interventions included: supplemental oxygen , aggressive diuresis and also was placed on NIPPV.  Her mental status improved, currently s/p > 10 liters diuresis w/ wt that went from 342 to 314 lbs from time of admit to 6/11 and was nearing discharge and PCCM asked to eval to assist w/ setting pt up w/ home nocturnal ventilatory assist for HS.    Baseline: lives w husband. Decreased activity ~ 1 yr. Sleeping in recliner/essentially living in recliner for several months. About a month ago was last time she was able to walk w/ significant assist from husband   Pertinent  Medical History  Obesity w/ associated restrictive lung disease, currently untreated OSA and OHS, prior smoker, chronic bronchitis, COPD FeV1 57% FeV1/FVC 70% baseline exertional dyspnea, chronic hypoxic & hypercarbic resp failure  HFpEF, hypothyroidism, chronic lymphedema, chronic pain, chronic orthopnea   Significant Hospital Events: Including procedures, antibiotic start and stop dates in addition to other pertinent events   6/6 admitted w/ acute on chronic diastolic HF, vol overload, hypoxic and hypercarbic resp failure  6/7 ECHO Left Ventricle: Left ventricular ejection fraction, by visual estimation, is 50 to 55%. The left ventricle has low normal function. The left ventricle is not well visualized. There is no left ventricular  hypertrophy. Left  ventricular diastolic parameters are consistent with Grade I diastolic dysfunction (impaired relaxation). Elevated left atrial pressure. Right Ventricle: The right ventricular size is normal. Mildly increased right ventricular wall thickness. Global RV systolic function is was not well visualized. Left Atrium: Left atrial size was mild-moderately dilated. Right Atrium: Right atrial size was moderately dilated  Pericardium: There is no evidence of pericardial effusion.  Mitral Valve: The mitral valve is normal in structure. Moderate mitral  annular calcification. No evidence of mitral valve regurgitation. No  evidence of mitral valve stenosis by observation. MV peak gradient, 10.1  mmHg. Tricuspid Valve: The tricuspid valve is grossly normal. Tricuspid valve regurgitation is not demonstrated.  6/11 pulm consulted     Interim History / Subjective:  Feels better  Spouse at bedside, states she has some confusion  Objective    Blood pressure 134/62, pulse (!) 106, temperature 98.4 F (36.9 C), resp. rate 16, height 5' 2 (1.575 m), weight 130.8 kg, SpO2 92%.        Intake/Output Summary (Last 24 hours) at 08/23/2023 1128 Last data filed at 08/22/2023 1200 Gross per 24 hour  Intake --  Output 700 ml  Net -700 ml   Filed Weights   08/20/23 0500 08/21/23 0711 08/22/23 0703  Weight: (!) 142.6 kg 135.6 kg 130.8 kg    Examination: General: Chronically ill-appearing HENT: Moist oral mucosa Lungs: Few rales at the bases Cardiovascular: S1-S2 appreciated Abdomen: Soft, bowel sounds appreciated Extremities: Signs of chronic venous stasis Neuro: Alert oriented to person, place GU:   Resolved problem list   Assessment and Plan   Acute on chronic hypoxic and hypercapnic respiratory failure -  Tolerating BiPAP at night  Restrictive lung disease from obesity/obesity hypoventilation syndrome  Gold 3 COPD  Cor pulmonale, pulmonary edema, volume overload  Deconditioning  Will benefit  from noninvasive ventilator - She is currently tolerating BiPAP while she is in the hospital -BiPAP settings of 18/8 with oxygen  at 7 L into the system, respiratory rate of 20/min  Tentative plan at present is to discharge to a skilled nursing facility  Myer Artis, MD [place] [facility] Pager: See Tilford Foley

## 2023-08-24 ENCOUNTER — Inpatient Hospital Stay (HOSPITAL_COMMUNITY)

## 2023-08-24 DIAGNOSIS — J441 Chronic obstructive pulmonary disease with (acute) exacerbation: Secondary | ICD-10-CM | POA: Diagnosis not present

## 2023-08-24 DIAGNOSIS — J9622 Acute and chronic respiratory failure with hypercapnia: Secondary | ICD-10-CM | POA: Diagnosis not present

## 2023-08-24 DIAGNOSIS — E662 Morbid (severe) obesity with alveolar hypoventilation: Secondary | ICD-10-CM | POA: Diagnosis not present

## 2023-08-24 DIAGNOSIS — J9621 Acute and chronic respiratory failure with hypoxia: Secondary | ICD-10-CM | POA: Diagnosis not present

## 2023-08-24 DIAGNOSIS — J449 Chronic obstructive pulmonary disease, unspecified: Secondary | ICD-10-CM | POA: Diagnosis not present

## 2023-08-24 LAB — COMPREHENSIVE METABOLIC PANEL WITH GFR
ALT: 17 U/L (ref 0–44)
AST: 44 U/L — ABNORMAL HIGH (ref 15–41)
Albumin: 2.5 g/dL — ABNORMAL LOW (ref 3.5–5.0)
Alkaline Phosphatase: 85 U/L (ref 38–126)
Anion gap: 14 (ref 5–15)
BUN: 31 mg/dL — ABNORMAL HIGH (ref 8–23)
CO2: 37 mmol/L — ABNORMAL HIGH (ref 22–32)
Calcium: 9.1 mg/dL (ref 8.9–10.3)
Chloride: 73 mmol/L — ABNORMAL LOW (ref 98–111)
Creatinine, Ser: 0.93 mg/dL (ref 0.44–1.00)
GFR, Estimated: >60 mL/min (ref >60)
Glucose, Bld: 84 mg/dL (ref 70–99)
Potassium: 2.6 mmol/L — CL (ref 3.5–5.1)
Sodium: 124 mmol/L — ABNORMAL LOW (ref 135–145)
Total Bilirubin: 3.7 mg/dL — ABNORMAL HIGH (ref 0.0–1.2)
Total Protein: 8.7 g/dL — ABNORMAL HIGH (ref 6.5–8.1)

## 2023-08-24 LAB — BASIC METABOLIC PANEL WITH GFR
Anion gap: 15 (ref 5–15)
BUN: 36 mg/dL — ABNORMAL HIGH (ref 8–23)
CO2: 34 mmol/L — ABNORMAL HIGH (ref 22–32)
Calcium: 9.1 mg/dL (ref 8.9–10.3)
Chloride: 71 mmol/L — ABNORMAL LOW (ref 98–111)
Creatinine, Ser: 1.16 mg/dL — ABNORMAL HIGH (ref 0.44–1.00)
GFR, Estimated: 50 mL/min — ABNORMAL LOW (ref >60)
Glucose, Bld: 108 mg/dL — ABNORMAL HIGH (ref 70–99)
Potassium: 3.5 mmol/L (ref 3.5–5.1)
Sodium: 120 mmol/L — ABNORMAL LOW (ref 135–145)

## 2023-08-24 LAB — BLOOD GAS, VENOUS
Acid-Base Excess: 22.7 mmol/L — ABNORMAL HIGH (ref 0.0–2.0)
Bicarbonate: 46.3 mmol/L — ABNORMAL HIGH (ref 20.0–28.0)
O2 Saturation: 94 %
Patient temperature: 37.6
pCO2, Ven: 44 mmHg (ref 44–60)
pH, Ven: 7.63 (ref 7.25–7.43)
pO2, Ven: 62 mmHg — ABNORMAL HIGH (ref 32–45)

## 2023-08-24 MED ORDER — AZITHROMYCIN 250 MG PO TABS
250.0000 mg | ORAL_TABLET | Freq: Every day | ORAL | Status: AC
  Administered 2023-08-26 – 2023-08-28 (×3): 250 mg via ORAL
  Filled 2023-08-24 (×3): qty 1

## 2023-08-24 MED ORDER — STERILE WATER FOR INJECTION IJ SOLN
INTRAMUSCULAR | Status: AC
  Administered 2023-08-24: 5 mL
  Filled 2023-08-24: qty 10

## 2023-08-24 MED ORDER — ACETAZOLAMIDE SODIUM 500 MG IJ SOLR
500.0000 mg | Freq: Once | INTRAMUSCULAR | Status: AC
  Administered 2023-08-24: 500 mg via INTRAVENOUS
  Filled 2023-08-24: qty 500

## 2023-08-24 MED ORDER — SODIUM CHLORIDE 0.9 % IV SOLN
2.0000 g | Freq: Three times a day (TID) | INTRAVENOUS | Status: AC
  Administered 2023-08-24 – 2023-08-28 (×13): 2 g via INTRAVENOUS
  Filled 2023-08-24 (×13): qty 12.5

## 2023-08-24 MED ORDER — ENSURE PLUS HIGH PROTEIN PO LIQD
237.0000 mL | Freq: Two times a day (BID) | ORAL | Status: DC
  Administered 2023-08-26 – 2023-09-01 (×11): 237 mL via ORAL

## 2023-08-24 MED ORDER — FUROSEMIDE 10 MG/ML IJ SOLN
60.0000 mg | Freq: Two times a day (BID) | INTRAMUSCULAR | Status: DC
  Filled 2023-08-24: qty 6

## 2023-08-24 MED ORDER — POTASSIUM CHLORIDE 10 MEQ/100ML IV SOLN
10.0000 meq | INTRAVENOUS | Status: AC
  Administered 2023-08-24 (×6): 10 meq via INTRAVENOUS
  Filled 2023-08-24 (×5): qty 100

## 2023-08-24 MED ORDER — AZITHROMYCIN 250 MG PO TABS
500.0000 mg | ORAL_TABLET | Freq: Every day | ORAL | Status: AC
  Administered 2023-08-24: 500 mg via ORAL
  Filled 2023-08-24: qty 2

## 2023-08-24 NOTE — Progress Notes (Signed)
 NAME:  Morgan Odonnell, MRN:  [id number], DOB:  March 22, 1950, LOS: 9 ADMISSION DATE:  08/15/2023, CONSULTATION DATE:  08/20/2023 REFERRING MD:  Lesa Rape, CHIEF COMPLAINT: Acute on chronic hypercapnic respiratory failure  History of Present Illness:  73 year old female w/ sig h/o tobacco abuse, obesity, OHS/OSA COPD GOLD C and chronic hypoxic and Hypercarbic resp failure.  Admitted 6/6 w/ decreased MS and hypersomnolence Baseline serum bicarb 38, BNP elevated 800, congestion on CXR,  and VBG w/ PCO2 78, bicarb 51 and ph 7.42 Admitted w/ working dx of acute diastolic HF  Therapeutic interventions included: supplemental oxygen , aggressive diuresis and also was placed on NIPPV.  Her mental status improved, currently s/p > 10 liters diuresis w/ wt that went from 342 to 314 lbs from time of admit to 6/11 and was nearing discharge and PCCM asked to eval to assist w/ setting pt up w/ home nocturnal ventilatory assist for HS.    Baseline: lives w husband. Decreased activity ~ 1 yr. Sleeping in recliner/essentially living in recliner for several months. About a month ago was last time she was able to walk w/ significant assist from husband   Pertinent  Medical History  Obesity w/ associated restrictive lung disease, currently untreated OSA and OHS, prior smoker, chronic bronchitis, COPD FeV1 57% FeV1/FVC 70% baseline exertional dyspnea, chronic hypoxic & hypercarbic resp failure  HFpEF, hypothyroidism, chronic lymphedema, chronic pain, chronic orthopnea   Significant Hospital Events: Including procedures, antibiotic start and stop dates in addition to other pertinent events   6/6 admitted w/ acute on chronic diastolic HF, vol overload, hypoxic and hypercarbic resp failure  6/7 ECHO Left Ventricle: Left ventricular ejection fraction, by visual estimation, is 50 to 55%. The left ventricle has low normal function. The left ventricle is not well visualized. There is no left ventricular  hypertrophy. Left  ventricular diastolic parameters are consistent with Grade I diastolic dysfunction (impaired relaxation). Elevated left atrial pressure. Right Ventricle: The right ventricular size is normal. Mildly increased right ventricular wall thickness. Global RV systolic function is was not well visualized. Left Atrium: Left atrial size was mild-moderately dilated. Right Atrium: Right atrial size was moderately dilated  Pericardium: There is no evidence of pericardial effusion.  Mitral Valve: The mitral valve is normal in structure. Moderate mitral  annular calcification. No evidence of mitral valve regurgitation. No  evidence of mitral valve stenosis by observation. MV peak gradient, 10.1  mmHg. Tricuspid Valve: The tricuspid valve is grossly normal. Tricuspid valve regurgitation is not demonstrated.  6/11 pulm consulted     Interim History / Subjective:  More confused Will not leave BiPAP mask on  Spouse at bedside  Objective    Blood pressure 138/68, pulse (!) 107, temperature 98.1 F (36.7 C), temperature source Oral, resp. rate (!) 26, height 5' 2 (1.575 m), weight 131 kg, SpO2 94%.        Intake/Output Summary (Last 24 hours) at 08/24/2023 1204 Last data filed at 08/24/2023 4098 Gross per 24 hour  Intake 1003 ml  Output 1350 ml  Net -347 ml   Filed Weights   08/21/23 0711 08/22/23 0703 08/24/23 0507  Weight: 135.6 kg 130.8 kg 131 kg    Examination: Chronically ill-appearing Does not appear in extremis Nonrebreather mask on  I reviewed chest x-ray VBG pending Resolved problem list   Assessment and Plan   Acute on chronic hypoxic and hypercapnic respiratory failure - Refusing BiPAP - On nonrebreather mask at present as she is mouth breathing  Coughing, congestion - May be from pulmonary congestion - Cannot rule out a COPD exacerbation as well - Empirically start azithromycin   Gold 3 COPD - Continue bronchodilators  Restrictive lung disease from obesity/obesity  hypoventilation syndrome Cor pulmonale with pulmonary edema and volume overload - Has remained on Lasix  40 twice a day  She will need a noninvasive ventilator at night If she is refusing a BiPAP while in hospital not sure she will be compliant with NIV at home  I did discuss with the spouse what the expectation is if she is not keeping BiPAP on, no other good way to get rid of the carbon dioxide -Unfortunately, may deteriorate to needing a ventilator and weaning extubation may be a significant challenge  If able continue BiPAP 18/8  Order Diamox 500 IV x 1   Myer Artis, MD [place] [facility] Pager: See Tilford Foley

## 2023-08-24 NOTE — Progress Notes (Signed)
 PROGRESS NOTE    Morgan Odonnell  ZDG:[id number] DOB: 1950/12/22 DOA: 08/15/2023 PCP: Bertha Broad, MD    Hospital course:  17 YOF w/ hx of OHS/OSA, CHRF on 3 L O2, restrictive lung disease secondary to obesity, morbid obesity, HFpEF, hypothyroidism, mood disorder, chronic pain presented with concern for increased somnolence at home. At time of interview patient is awake alert and fully oriented.  She reports that her husband has been more concerned because over the past few days she has been sleeping more often.She reports that she has had worsening shortness of breath, chest pressure at rest, cough productive with white sputum over the past week.  No fever, chills.  She has chronic lymphedema thinks that her legs have become more swollen.  She has chronic orthopnea.  She has previously been intolerant of noninvasive ventilation with sleep and does not use at home.  Workup in the ED were notable for labs showed BNP 821 bicarb 31 hemoglobin 9 previously 13 g, chest x-ray with basilar atelectasis RT>LT. Blood gases pH 7.4 PCO278 pO2 less than 30 compensated picture Patient admitted for increased somnolence with acute on chronic hypoxic and hypercapnic respiratory failure.   Subjective:  Spoke with RN who notes that patient has been more hypoxic and been more confused but has not tolerated CPAP, has been, she has been climbing it off.  She has tolerated NRB with improved oxygenation.  Patient's husband notes that she has been much more confused today than she was yesterday.  He believes that she was doing better yesterday than she was today.  He also notes that she appears to have a cough this morning.  Patient is able to answer yes/no questions but voices no acute concerns but nods when I ask her if she is short of breath.  I asked her if she will use the CPAP and she shakes her head no.  Exam:  General: [place] obese chronically ill-appearing female sitting up in bed with NRB in place,  she is awake and alert but restless and somewhat confused.  She has moderately labored tachypnea. CVS: S1-S2, regular  Respiratory: Harsh breath sounds both inspiratory and expiratory with bilateral rales. GI: NABS, soft, NT  LE: Marked lymphedema with hyperkeratosis and cracking of her skin  Assessment & Plan:    Assessment and plan:  Acute on chronic hypoxic and hypercapnic respiratory failure  Obesity hypoventilation Probable pulmonary hypertension secondary to chronic hypoxia Restrictive lung disease from obesity Noncompliance with CPAP/OSA Pulmonary edema secondary to decompensated HFpEF Increased somnolence Increased confusion Per report patient is worse today than yesterday She is more hypoxic and more hypercapnic This x-ray does show persistent pulmonary edema as well as possible left atelectasis versus infection.  Given new cough and slight leukocytosis, will start treatment for H CAP. PCCM had started azithromycin  earlier today, will add cefepime . Increase Lasix  from 40 IV twice daily to 60 IV twice daily, continue metolazone  Continue inhaled bronchodilators  Goals for care Please see my previous note from today Long discussion with patient's husband and son Since husband states that patient has previously said that she does not want to ever be intubated and she wants to be DNR. Patient continues to refuse CPAP and BiPAP Patient's husband and son are aware that although we are treating pneumonia, pulmonary edema, patient needs respiratory support. 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  Chronic lymphedema Patient would benefit  from wrapping if clinically warranted No evidence of infection  Chronic pain Patient remains on pregabalin  and as needed baclofen    DVT prophylaxis: Lovenox  Code Status: DNR/DNI Family Communication: Patient's husband at bedside, spoke with her son and daughter over the phone Disposition: TBD                 Diet Orders (From admission, onward)     Start     Ordered   08/15/23 2313  Diet 2 gram sodium Room service appropriate? Yes; Fluid consistency: Thin  Diet effective now       Question Answer Comment  Room service appropriate? Yes   Fluid consistency: Thin      08/15/23 2313            Objective: Vitals:   08/24/23 0507 08/24/23 0529 08/24/23 0800 08/24/23 1329  BP: 138/68   120/84  Pulse: (!) 107   (!) 101  Resp:  20 (!) 26 14  Temp: 98.1 F (36.7 C)   98.2 F (36.8 C)  TempSrc: Oral   Oral  SpO2: (!) 80% 94%  93%  Weight: 131 kg     Height:        Intake/Output Summary (Last 24 hours) at 08/24/2023 1800 Last data filed at 08/24/2023 1703 Gross per 24 hour  Intake 663 ml  Output 725 ml  Net -62 ml   Filed Weights   08/21/23 0711 08/22/23 0703 08/24/23 0507  Weight: 135.6 kg 130.8 kg 131 kg    Scheduled Meds:  [START ON 08/25/2023] azithromycin   250 mg Oral Daily   citalopram   20 mg Oral Daily   enoxaparin  (LOVENOX ) injection  60 mg Subcutaneous Q24H   [START ON 08/25/2023] feeding supplement  237 mL Oral BID BM   ferrous sulfate   325 mg Oral BID WC   fluticasone  furoate-vilanterol  1 puff Inhalation Daily   [START ON 08/25/2023] furosemide   60 mg Intravenous BID   Gerhardt's butt cream   Topical BID   hydrocerin   Topical BID   levothyroxine   200 mcg Oral Q0600   metolazone   10 mg Oral Daily   nicotine   21 mg Transdermal Daily   mouth rinse  15 mL Mouth Rinse 4 times per day   pregabalin   25 mg Oral BID   sodium chloride  flush  3 mL Intravenous Q12H   umeclidinium bromide   1 puff Inhalation Daily   Continuous Infusions:  ceFEPime   (MAXIPIME ) IV 2 g (08/24/23 1534)   potassium chloride  10 mEq (08/24/23 1733)    Nutritional status     Body mass index is 52.82 kg/m.  Data Reviewed:   CBC: Recent Labs  Lab 08/18/23 0355 08/19/23 0411 08/20/23 0412 08/21/23 0413 08/22/23 0344  WBC 7.9 7.8 8.8 8.9 10.9*  HGB 9.0* 9.8* 9.9* 10.7* 11.6*  HCT 31.1* 33.1* 32.8* 35.6* 37.3  MCV 94.0 91.2 90.1 91.8 88.2  PLT 140* 148* 170 188 209   Basic Metabolic Panel: Recent Labs  Lab 08/20/23 0412 08/21/23 0413 08/21/23 1621 08/22/23 0344 08/22/23 2218 08/24/23 1307  NA 133* 131*  --  129* 126* 124*  K 3.3* 2.9*  --  2.8* 3.2* 2.6*  CL 83* 81*  --  76* 74* 73*  CO2 40* 37*  --  38* 35* 37*  GLUCOSE 120*  83  --  89 118* 84  BUN 12 13  --  17 21 31*  CREATININE 0.67 0.62  --  0.73 0.88 0.93  CALCIUM  9.1 9.2  --  9.7 9.4 9.1  MG  --   --  1.8  --  1.9  --    GFR: Estimated Creatinine Clearance: 72.3 mL/min (by C-G formula based on SCr of 0.93 mg/dL). Liver Function Tests: Recent Labs  Lab 08/24/23 1307  AST 44*  ALT 17  ALKPHOS 85  BILITOT 3.7*  PROT 8.7*  ALBUMIN 2.5*   No results for input(s): LIPASE, AMYLASE in the last 168 hours. No results for input(s): AMMONIA in the last 168 hours. Coagulation Profile: No results for input(s): INR, PROTIME in the last 168 hours. Cardiac Enzymes: No results for input(s): CKTOTAL, CKMB, CKMBINDEX, TROPONINI in the last 168 hours. BNP (last 3 results) No results for input(s): PROBNP in the last 8760 hours. HbA1C: No results for input(s): HGBA1C in the last 72 hours. CBG: No results for input(s): GLUCAP in the last 168 hours. Lipid Profile: No results for input(s): CHOL, HDL, LDLCALC, TRIG, CHOLHDL, LDLDIRECT in the last 72 hours. Thyroid  Function Tests: No results for input(s): TSH, T4TOTAL, FREET4, T3FREE, THYROIDAB in the last 72 hours. Anemia Panel: No results for input(s): VITAMINB12, FOLATE, FERRITIN,  TIBC, IRON, RETICCTPCT in the last 72 hours. Sepsis Labs: No results for input(s): PROCALCITON, LATICACIDVEN in the last 168 hours.  Recent Results (from the past 240 hours)  Resp panel by RT-PCR (RSV, Flu A&B, Covid) Anterior Nasal Swab     Status: None   Collection Time: 08/16/23  3:07 AM   Specimen: Anterior Nasal Swab  Result Value Ref Range Status   SARS Coronavirus 2 by RT PCR NEGATIVE NEGATIVE Final    Comment: (NOTE) SARS-CoV-2 target nucleic acids are NOT DETECTED.  The SARS-CoV-2 RNA is generally detectable in upper respiratory specimens during the acute phase of infection. The lowest concentration of SARS-CoV-2 viral copies this assay can detect is 138 copies/mL. A negative result does not preclude SARS-Cov-2 infection and should not be used as the sole basis for treatment or other patient management decisions. A negative result may occur with  improper specimen collection/handling, submission of specimen other than nasopharyngeal swab, presence of viral mutation(s) within the areas targeted by this assay, and inadequate number of viral copies(<138 copies/mL). A negative result must be combined with clinical observations, patient history, and epidemiological information. The expected result is Negative.  Fact Sheet for Patients:  BloggerCourse.com  Fact Sheet for Healthcare Providers:  SeriousBroker.it  This test is no t yet approved or cleared by the [place]  FDA and  has been authorized for detection and/or diagnosis of SARS-CoV-2 by FDA under an Emergency Use Authorization (EUA). This EUA will remain  in effect (meaning this test can be used) for the duration of the COVID-19 declaration under Section 564(b)(1) of the Act, 21 U.S.C.section 360bbb-3(b)(1), unless the authorization is terminated  or revoked sooner.       Influenza A by PCR NEGATIVE NEGATIVE Final   Influenza B by PCR NEGATIVE  NEGATIVE Final    Comment: (NOTE) The Xpert Xpress SARS-CoV-2/FLU/RSV plus assay is intended as an aid in the diagnosis of influenza from Nasopharyngeal swab specimens and should not be used as a sole basis for treatment. Nasal washings and aspirates are unacceptable for Xpert Xpress SARS-CoV-2/FLU/RSV testing.  Fact Sheet for Patients: BloggerCourse.com  Fact Sheet for Healthcare Providers: SeriousBroker.it  This test  is not yet approved or cleared by the [place]  FDA and has been authorized for detection and/or diagnosis of SARS-CoV-2 by FDA under an Emergency Use Authorization (EUA). This EUA will remain in effect (meaning this test can be used) for the duration of the COVID-19 declaration under Section 564(b)(1) of the Act, 21 U.S.C. section 360bbb-3(b)(1), unless the authorization is terminated or revoked.     Resp Syncytial Virus by PCR NEGATIVE NEGATIVE Final    Comment: (NOTE) Fact Sheet for Patients: BloggerCourse.com  Fact Sheet for Healthcare Providers: SeriousBroker.it  This test is not yet approved or cleared by the [place]  FDA and has been authorized for detection and/or diagnosis of SARS-CoV-2 by FDA under an Emergency Use Authorization (EUA). This EUA will remain in effect (meaning this test can be used) for the duration of the COVID-19 declaration under Section 564(b)(1) of the Act, 21 U.S.C. section 360bbb-3(b)(1), unless the authorization is terminated or revoked.  Performed at [facility], [address] [place], [place] [address]   MRSA Next Gen by PCR, Nasal     Status: Abnormal   Collection Time: 08/16/23  3:07 AM   Specimen: Nasal Mucosa; Nasal Swab  Result Value Ref Range Status   MRSA by PCR Next Gen DETECTED (A) NOT DETECTED Final    Comment: RESULT CALLED TO, READ BACK BY AND VERIFIED WITH: LANCE F. RN AT 1002 ON  08/16/2023 BY JM (NOTE) The GeneXpert MRSA Assay (FDA approved for NASAL specimens only), is one component of a comprehensive MRSA colonization surveillance program. It is not intended to diagnose MRSA infection nor to guide or monitor treatment for MRSA infections. Test performance is not FDA approved in patients less than 72 years old. Performed at [facility], [address] [place], [place] [address]          Radiology Studies: DG CHEST PORT 1 VIEW Result Date: 08/24/2023 CLINICAL DATA:  [id number] Dyspnea [id number] EXAM: PORTABLE CHEST 1 VIEW COMPARISON:  August 15, 2023 FINDINGS: The cardiomediastinal silhouette is unchanged and enlarged in contour. Similar LEFT-sided pleural blunting. No pneumothorax. Perihilar vascular congestion with diffuse reticular prominence and peribronchial cuffing, similar compared to prior. There is homogeneous opacification of the LEFT retrocardiac region IMPRESSION: 1. Similar appearance of pulmonary edema. 2. Homogeneous opacification of the LEFT retrocardiac region could reflect atelectasis versus infection. Electronically Signed   By: Clancy Crimes M.D.   On: 08/24/2023 11:41           LOS: 9 days   Time spent= 35 mins    Magdalene School, MD [facility]  If 7PM-7AM, please contact night-coverage  08/24/2023, 6:00 PM

## 2023-08-24 NOTE — Progress Notes (Addendum)
 I spoke with patient's husband and her son was on the phone to discuss patient's rising CO2 levels and implications including increasing confusion, agitation possible somnolence and ultimately death.  This time, patient remains awake and continues to refuse CPAP or BiPAP.    Patient's husband states that he had discussed this with her and that she is DNR.  He also notes that she told him she would never want to be on a machine for breathing.  I discussed also that weaning extubation may be difficult if she were to be intubated and on a ventilator.  Patient's husband said she does not want these machines, she definitely does not want that other breathing machine.  Patient's husband states that she has been clear and consistent in these wishes.  Patient's son was on the phone during today's conversation.  Patient's son is understandably upset and was wondering if there is any operation you can do to help her breathe.  We again discussed the prognosis of her advanced hypoxic and hypercarbic respiratory failure, which is extremely poor.  Patient's son asked his father if he could please just force her to be on BiPAP and he said I am not going to go against her wishes.  Chest x-ray done today shows pneumonia cannot be ruled out, patient was started empirically on azithromycin  by Dr. Gaynell Keeler.  Will add cefepime .  She also shows some pulmonary edema that is unchanged from previous, patient is on Lasix  40 IV twice daily with approximately 300-400 cc after Lasix .  Will increase Lasix  to 80 IV twice daily.  Also of note, patient's potassium is 2.6, she was unable to orally supplement earlier today.  Will start patient on KCl IV which unfortunately would be more fluids. Patient's family understands that without BiPAP, there are no other options Will correct patient's CODE STATUS to DNR and DNI as per her wishes.

## 2023-08-24 NOTE — TOC Progression Note (Signed)
 Transition of Care [facility]) - Progression Note    Patient Details  Name: Morgan Odonnell MRN: [id number] Date of Birth: [birth year]  [facility] CM/SW Contact  Katrine Parody, [place] Phone Number: 08/24/2023, 10:43 AM  Clinical Narrative:     CSW visited pt /spouse bedside and attempted to discuss bed offers.  Pt wearing Bipap, unable to communicate- just listened, pt/partner were holding hands- requested that this be discussed later because she is trying to get her breathing together.  CSW was then asked to ask a nurse to come down, I agreed , also recommending that the call bell on remote be used.  TOC to follow.   Expected Discharge Plan: Skilled Nursing Facility Barriers to Discharge: Continued Medical Work up  Expected Discharge Plan and Services In-house Referral: NA     Living arrangements for the past 2 months: Single Family Home                 DME Arranged: N/A DME Agency: NA       HH Arranged: NA HH Agency: NA         Social Determinants of Health (SDOH) Interventions SDOH Screenings   Food Insecurity: No Food Insecurity (08/16/2023)  Housing: Low Risk  (08/16/2023)  Transportation Needs: No Transportation Needs (08/16/2023)  Utilities: Not At Risk (08/16/2023)  Depression (PHQ2-9): Medium Risk (01/13/2019)  Social Connections: Moderately Integrated (08/16/2023)  Tobacco Use: Medium Risk (08/15/2023)    Readmission Risk Interventions    08/17/2023    9:31 AM  Readmission Risk Prevention Plan  Transportation Screening Complete  PCP or Specialist Appt within 5-7 Days Complete  Home Care Screening Complete  Medication Review (RN CM) Complete

## 2023-08-24 NOTE — Progress Notes (Signed)
   08/24/23 2240  BiPAP/CPAP/SIPAP  BiPAP/CPAP/SIPAP Pt Type Adult  BiPAP/CPAP/SIPAP DREAMSTATIOND  Mask Type Nasal mask  Dentures removed? Not applicable  Mask Size Small  Flow Rate 15 lpm  Patient Home Machine No  Patient Home Mask No  Patient Home Tubing No  Auto Titrate No  Minimum cmH2O 8 cmH2O  Maximum cmH2O 18 cmH2O  CPAP/SIPAP surface wiped down Yes  BiPAP/CPAP /SiPAP Vitals  Resp 18  MEWS Score/Color  MEWS Score 1  MEWS Score Color Green

## 2023-08-25 ENCOUNTER — Inpatient Hospital Stay (HOSPITAL_COMMUNITY)

## 2023-08-25 DIAGNOSIS — E662 Morbid (severe) obesity with alveolar hypoventilation: Secondary | ICD-10-CM | POA: Diagnosis not present

## 2023-08-25 DIAGNOSIS — J449 Chronic obstructive pulmonary disease, unspecified: Secondary | ICD-10-CM | POA: Diagnosis not present

## 2023-08-25 DIAGNOSIS — J9622 Acute and chronic respiratory failure with hypercapnia: Secondary | ICD-10-CM | POA: Diagnosis not present

## 2023-08-25 DIAGNOSIS — J9621 Acute and chronic respiratory failure with hypoxia: Secondary | ICD-10-CM | POA: Diagnosis not present

## 2023-08-25 LAB — BASIC METABOLIC PANEL WITH GFR
Anion gap: 12 (ref 5–15)
BUN: 40 mg/dL — ABNORMAL HIGH (ref 8–23)
CO2: 33 mmol/L — ABNORMAL HIGH (ref 22–32)
Calcium: 8.9 mg/dL (ref 8.9–10.3)
Chloride: 75 mmol/L — ABNORMAL LOW (ref 98–111)
Creatinine, Ser: 1.13 mg/dL — ABNORMAL HIGH (ref 0.44–1.00)
GFR, Estimated: 52 mL/min — ABNORMAL LOW (ref >60)
Glucose, Bld: 100 mg/dL — ABNORMAL HIGH (ref 70–99)
Potassium: 3.2 mmol/L — ABNORMAL LOW (ref 3.5–5.1)
Sodium: 120 mmol/L — ABNORMAL LOW (ref 135–145)

## 2023-08-25 LAB — CBC WITH DIFFERENTIAL/PLATELET
Abs Immature Granulocytes: 0.27 10*3/uL — ABNORMAL HIGH (ref 0.00–0.07)
Basophils Absolute: 0.1 10*3/uL (ref 0.0–0.1)
Basophils Relative: 0 %
Eosinophils Absolute: 0 10*3/uL (ref 0.0–0.5)
Eosinophils Relative: 0 %
HCT: 36.8 % (ref 36.0–46.0)
Hemoglobin: 11.6 g/dL — ABNORMAL LOW (ref 12.0–15.0)
Immature Granulocytes: 1 %
Lymphocytes Relative: 5 %
Lymphs Abs: 1.7 10*3/uL (ref 0.7–4.0)
MCH: 28.2 pg (ref 26.0–34.0)
MCHC: 31.5 g/dL (ref 30.0–36.0)
MCV: 89.5 fL (ref 80.0–100.0)
Monocytes Absolute: 1.8 10*3/uL — ABNORMAL HIGH (ref 0.1–1.0)
Monocytes Relative: 5 %
Neutro Abs: 30.8 10*3/uL — ABNORMAL HIGH (ref 1.7–7.7)
Neutrophils Relative %: 89 %
Platelets: 183 10*3/uL (ref 150–400)
RBC: 4.11 MIL/uL (ref 3.87–5.11)
RDW: 19.1 % — ABNORMAL HIGH (ref 11.5–15.5)
WBC: 34.7 10*3/uL — ABNORMAL HIGH (ref 4.0–10.5)
nRBC: 0 % (ref 0.0–0.2)

## 2023-08-25 LAB — BLOOD GAS, ARTERIAL
Acid-Base Excess: 15.6 mmol/L — ABNORMAL HIGH (ref 0.0–2.0)
Bicarbonate: 41.3 mmol/L — ABNORMAL HIGH (ref 20.0–28.0)
Drawn by: 336832
O2 Content: 10 L/min
O2 Saturation: 97 %
Patient temperature: 37
pCO2 arterial: 53 mmHg — ABNORMAL HIGH (ref 32–48)
pH, Arterial: 7.5 — ABNORMAL HIGH (ref 7.35–7.45)
pO2, Arterial: 73 mmHg — ABNORMAL LOW (ref 83–108)

## 2023-08-25 LAB — BLOOD GAS, VENOUS
Acid-Base Excess: 18 mmol/L — ABNORMAL HIGH (ref 0.0–2.0)
Bicarbonate: 43.5 mmol/L — ABNORMAL HIGH (ref 20.0–28.0)
O2 Saturation: 93.2 %
Patient temperature: 36.9
pCO2, Ven: 52 mmHg (ref 44–60)
pH, Ven: 7.53 — ABNORMAL HIGH (ref 7.25–7.43)
pO2, Ven: 63 mmHg — ABNORMAL HIGH (ref 32–45)

## 2023-08-25 MED ORDER — ACETAZOLAMIDE SODIUM 500 MG IJ SOLR
500.0000 mg | Freq: Once | INTRAMUSCULAR | Status: AC
  Administered 2023-08-25: 500 mg via INTRAVENOUS
  Filled 2023-08-25 (×2): qty 500

## 2023-08-25 MED ORDER — SODIUM CHLORIDE 0.9 % IV SOLN: 250.0000 mL | INTRAVENOUS | Status: DC

## 2023-08-25 MED ORDER — NOREPINEPHRINE 4 MG/250ML-% IV SOLN
0.0000 ug/min | INTRAVENOUS | Status: DC
  Administered 2023-08-25: 2 ug/min via INTRAVENOUS
  Filled 2023-08-25: qty 250

## 2023-08-25 MED ORDER — SODIUM CHLORIDE 0.9 % IV BOLUS
500.0000 mL | Freq: Once | INTRAVENOUS | Status: AC | PRN
  Administered 2023-08-25: 500 mL via INTRAVENOUS

## 2023-08-25 MED ORDER — CHLORHEXIDINE GLUCONATE CLOTH 2 % EX PADS
6.0000 | MEDICATED_PAD | Freq: Every day | CUTANEOUS | Status: DC
Start: 2023-08-25 — End: 2023-08-31
  Administered 2023-08-25 – 2023-08-30 (×6): 6 via TOPICAL

## 2023-08-25 NOTE — Progress Notes (Addendum)
 PT Cancellation Note  Patient Details Name: Morgan Odonnell MRN: [id number] DOB: 02/19/1951   Cancelled Treatment:    Reason Eval/Treat Not Completed: Medical issues which prohibited therapy Rapid response, transfer to   ICU, on 10 LHFNC, low BP. Will check back  another time. Abelina Hoes PT Acute Rehabilitation Services Office [phone]   Dareen Ebbing 08/25/2023, 11:54 AM

## 2023-08-25 NOTE — Plan of Care (Signed)
   Problem: Education: Goal: Knowledge of General Education information will improve Description: Including pain rating scale, medication(s)/side effects and non-pharmacologic comfort measures Outcome: Progressing   Problem: Clinical Measurements: Goal: Respiratory complications will improve Outcome: Progressing   Problem: Pain Managment: Goal: General experience of comfort will improve and/or be controlled Outcome: Progressing

## 2023-08-25 NOTE — TOC Progression Note (Signed)
 Transition of Care [facility]) - Progression Note   Patient Details  Name: Morgan Odonnell MRN: [id number] Date of Birth: 06/14/1950  [facility] CM/SW Contact  Zenon Hilda, LCSW Phone Number: 08/25/2023, 3:32 PM  Clinical Narrative: CSW provided spouse with list of bed offers with Medicare star ratings. Patient received the following bed offers:  [facility] [address] [place], [place] [address] [phone] Overall rating ??  Below average  [facility] [address] [place], [place] [address] [phone] Overall rating? Much below average  [facility] [address] [place], [place] [address] [phone] Overall rating ? Much below average  [facility] [address] [place], [place] [address] [phone] Overall rating ?? Below average  [facility] [address] [place], [place] [address] [phone] Overall rating ? Much below average  [facility] [address] [place], [place] [address] [phone] Overall rating ??? Average  [facility] [address] [place], [place] [address] [phone] Overall rating ? Much below average  [facility] [address] [place], [place] [address] [phone] Overall rating ?? Below average  Family to review bed offers. TOC to follow.  Expected Discharge Plan: Skilled Nursing Facility Barriers to Discharge: Continued Medical Work up  Expected Discharge Plan and Services In-house Referral: NA Living arrangements for the past 2 months: Single Family Home           DME Arranged: N/A DME Agency: NA HH Arranged: NA HH Agency: NA  Social Determinants of Health (SDOH) Interventions SDOH Screenings   Food Insecurity: No Food Insecurity (08/16/2023)   Housing: Low Risk  (08/16/2023)  Transportation Needs: No Transportation Needs (08/16/2023)  Utilities: Not At Risk (08/16/2023)  Depression (PHQ2-9): Medium Risk (01/13/2019)  Social Connections: Moderately Integrated (08/16/2023)  Tobacco Use: Medium Risk (08/15/2023)   Readmission Risk Interventions    08/17/2023    9:31 AM  Readmission Risk Prevention Plan  Transportation Screening Complete  PCP or Specialist Appt within 5-7 Days Complete  Home Care Screening Complete  Medication Review (RN CM) Complete

## 2023-08-25 NOTE — Progress Notes (Signed)
 NAME:  SIMRAH CHATHAM, MRN:  [id number], DOB:  March 28, 1950, LOS: 10 ADMISSION DATE:  08/15/2023, CONSULTATION DATE:  08/20/2023 REFERRING MD:  Lesa Rape, CHIEF COMPLAINT: Acute on chronic hypercapnic respiratory failure  History of Present Illness:  73 year old female w/ sig h/o tobacco abuse, obesity, OHS/OSA COPD GOLD C and chronic hypoxic and Hypercarbic resp failure.  Admitted 6/6 w/ decreased MS and hypersomnolence Baseline serum bicarb 38, BNP elevated 800, congestion on CXR,  and VBG w/ PCO2 78, bicarb 51 and ph 7.42 Admitted w/ working dx of acute diastolic HF  Therapeutic interventions included: supplemental oxygen , aggressive diuresis and also was placed on NIPPV.  Her mental status improved, currently s/p > 10 liters diuresis w/ wt that went from 342 to 314 lbs from time of admit to 6/11 and was nearing discharge and PCCM asked to eval to assist w/ setting pt up w/ home nocturnal ventilatory assist for HS.    Baseline: lives w husband. Decreased activity ~ 1 yr. Sleeping in recliner/essentially living in recliner for several months. About a month ago was last time she was able to walk w/ significant assist from husband   Pertinent  Medical History  Obesity w/ associated restrictive lung disease, currently untreated OSA and OHS, prior smoker, chronic bronchitis, COPD FeV1 57% FeV1/FVC 70% baseline exertional dyspnea, chronic hypoxic & hypercarbic resp failure  HFpEF, hypothyroidism, chronic lymphedema, chronic pain, chronic orthopnea   Significant Hospital Events: Including procedures, antibiotic start and stop dates in addition to other pertinent events   6/6 admitted w/ acute on chronic diastolic HF, vol overload, hypoxic and hypercarbic resp failure  6/7 ECHO Left Ventricle: Left ventricular ejection fraction, by visual estimation, is 50 to 55%. The left ventricle has low normal function. The left ventricle is not well visualized. There is no left ventricular  hypertrophy. Left  ventricular diastolic parameters are consistent with Grade I diastolic dysfunction (impaired relaxation). Elevated left atrial pressure. Right Ventricle: The right ventricular size is normal. Mildly increased right ventricular wall thickness. Global RV systolic function is was not well visualized. Left Atrium: Left atrial size was mild-moderately dilated. Right Atrium: Right atrial size was moderately dilated  Pericardium: There is no evidence of pericardial effusion.  Mitral Valve: The mitral valve is normal in structure. Moderate mitral  annular calcification. No evidence of mitral valve regurgitation. No  evidence of mitral valve stenosis by observation. MV peak gradient, 10.1  mmHg. Tricuspid Valve: The tricuspid valve is grossly normal. Tricuspid valve regurgitation is not demonstrated.  6/11 pulm consulted  6/16 thirds of decreased responsiveness   Interim History / Subjective:  Confusion, refusing BiPAP at night  Objective    Blood pressure (!) 97/58, pulse 80, temperature 98.1 F (36.7 C), temperature source Axillary, resp. rate 12, height 5' 2 (1.575 m), weight 131 kg, SpO2 95%.        Intake/Output Summary (Last 24 hours) at 08/25/2023 1014 Last data filed at 08/25/2023 0600 Gross per 24 hour  Intake 500 ml  Output 775 ml  Net -275 ml   Filed Weights   08/21/23 0711 08/22/23 0703 08/24/23 0507  Weight: 135.6 kg 130.8 kg 131 kg    Examination: Chronically ill-appearing Does not appear to be in extremities Decreased air movement bilaterally S1-S2 appreciated Bowel sounds appreciated Does not appear in extremis Nonrebreather mask on  I reviewed last 24 h vitals and pain scores, last 48 h intake and output, last 24 h labs and trends, and last 24 h imaging  results.  Resolved problem list   Assessment and Plan   Acute on chronic hypoxic and hypercapnic respiratory failure - Not accepting the use of BiPAP on a regular basis - Was on oxygen  supplementation this  morning - We will placed on BiPAP at some point  Coughing/congestion - Unable to rule out COPD exacerbation - Started on antibiotics empirically  Gold 3 COPD - Continue bronchodilators  Restrictive lung disease from obesity/obesity hypoventilation syndrome Cor pulmonale with pulmonary edema and volume overload - Being diuresed - Will give another dose of Diamox today  Will need noninvasive ventilator at night If she is refusing BiPAP, doubt she will be compliant with NIV at home  BiPAP 18/8  Order Diamox x 1 today  Myer Artis, MD [facility] Pager: See Tilford Foley

## 2023-08-25 NOTE — Progress Notes (Signed)
 Patient  unable to tolerate Cpap and NRB mask. Patient is currently on 10 L HFNC sustaining oxygen  levels >92%. Patient verbalized feeling much better with the HFNC. No distress observed at this time.

## 2023-08-25 NOTE — Plan of Care (Signed)
   Problem: Education: Goal: Knowledge of General Education information will improve Description: Including pain rating scale, medication(s)/side effects and non-pharmacologic comfort measures Outcome: Progressing   Problem: Clinical Measurements: Goal: Respiratory complications will improve Outcome: Progressing

## 2023-08-25 NOTE — Significant Event (Signed)
 Rapid Response Event Note   Reason for Call :  Decreased LOC after not wearing CPAP overnight  Initial Focused Assessment:  Patient laying in bed, RT in process of placing pt on CPAP. Per notes, patient did not tolerate CPAP mask overnight and historically has poor tolerance of noninvasive ventilation. Patient does arouse to voice, however cannot stay alert long enough to answer any questions. Lung sounds diminished bilaterally.    Interventions:  BiPAP started by RT ABG ordered & collected Placed on bedside pulse oximetry  Plan of Care:  Continue CPAP Await ABG result  Event Summary:   MD Notified: Dr. Alayne Allis; notified by bedside RN Call Time: [phone] Arrival Time: 8295 End Time: 6213  Deverick Pruss, RN

## 2023-08-25 NOTE — Progress Notes (Signed)
 Progress Note   Patient: Morgan Odonnell ZOX:[id number] DOB: 1950/09/27 DOA: 08/15/2023     10 DOS: the patient was seen and examined on 08/25/2023   Brief hospital course: Hospital course:  70 YOF w/ hx of OHS/OSA, CHRF on 3 L O2, restrictive lung disease secondary to obesity, morbid obesity, HFpEF, hypothyroidism, mood disorder, chronic pain presented with concern for increased somnolence at home. At time of interview patient is awake alert and fully oriented.  She reports that her husband has been more concerned because over the past few days she has been sleeping more often.She reports that she has had worsening shortness of breath, chest pressure at rest, cough productive with white sputum over the past week.  No fever, chills.  She has chronic lymphedema thinks that her legs have become more swollen.  She has chronic orthopnea.  She has previously been intolerant of noninvasive ventilation with sleep and does not use at home.  Workup in the ED were notable for labs showed BNP 821 bicarb 31 hemoglobin 9 previously 13 g, chest x-ray with basilar atelectasis RT>LT. Blood gases pH 7.4 PCO278 pO2 less than 30 compensated picture Patient admitted for increased somnolence with acute on chronic hypoxic and hypercapnic respiratory failure.   Subjective: This morning a rapid response was called due to patient being less responsive.  ABG was obtained showed some CO2 retention.  Yesterday patient had similar issues ongoing where she became hypoxic and confused and did not tolerate a CPAP.  During my encounter patient would awake to sternal rub and was able to answer my questions.  Blood pressure was soft at 93/46 with a MAP of 61. Sequently blood pressure dropped to 88/55 and she was transferred to the stepdown unit.  Husband and son was updated at that point  Assessment and plan:  Acute on chronic hypoxic and hypercapnic respiratory failure  Obesity hypoventilation Probable pulmonary hypertension  secondary to chronic hypoxia Restrictive lung disease from obesity Noncompliance with CPAP/OSA Pulmonary edema secondary to decompensated HFpEF Increased somnolence Hospital-acquired pneumonia Hypotension - Continue BiPAP support. - Continue antibiotics for HCAP.  Cefepime  azithromycin  - Holding diuretics due to ongoing hypotension. -Continue bronchodilators   Hypokalemia Continue replacement IV potassium  Persistent and worsening hyponatremia Likely multifactorial secondary to hypovolemia/total body water overload, ongoing naturesis/diuresis and possibly some level of SIADH from pulmonary abnormalities.  Chronic lymphedema Patient would benefit from wrapping if clinically warranted No evidence of infection  Chronic pain Patient remains on pregabalin  and as needed baclofen    DVT prophylaxis: Lovenox  Code Status: DNR/DNI Family Communication: Patient's husband at bedsideDisposition: TBD        Physical Exam: Vitals:   08/25/23 1120 08/25/23 1200 08/25/23 1237 08/25/23 1246  BP:  (!) 87/33 (!) 85/48 (!) 82/44  Pulse: 77 74 79 75  Resp: 19 18 18 17   Temp:      TempSrc:      SpO2: 93% 95% 94% 93%  Weight:      Height:       General: Obese female, chronically ill-appearing on the BiPAP mask.   CVS: S1-S2, regular  Respiratory: Harsh breath sounds both inspiratory and expiratory with bilateral rales. GI: NABS, soft, NT  LE: Bilateral lower extremity lymphedema with hyperkeratosis   Data Reviewed:  There are no new results to review at this time.  Family Communication: Husband and son  Disposition: Status is: Inpatient Remains inpatient appropriate because: Ongoing hypotension and respiratory failure  Planned Discharge Destination: To be determined    Time  spent: 35 minutes  Author: Volney Grumbles, MD 08/25/2023 12:52 PM  For on call review [email or web address].

## 2023-08-26 DIAGNOSIS — J9601 Acute respiratory failure with hypoxia: Secondary | ICD-10-CM | POA: Diagnosis not present

## 2023-08-26 DIAGNOSIS — J189 Pneumonia, unspecified organism: Secondary | ICD-10-CM | POA: Diagnosis not present

## 2023-08-26 DIAGNOSIS — J9622 Acute and chronic respiratory failure with hypercapnia: Secondary | ICD-10-CM | POA: Diagnosis not present

## 2023-08-26 DIAGNOSIS — E662 Morbid (severe) obesity with alveolar hypoventilation: Secondary | ICD-10-CM | POA: Diagnosis not present

## 2023-08-26 DIAGNOSIS — J9621 Acute and chronic respiratory failure with hypoxia: Secondary | ICD-10-CM | POA: Diagnosis not present

## 2023-08-26 DIAGNOSIS — J449 Chronic obstructive pulmonary disease, unspecified: Secondary | ICD-10-CM | POA: Diagnosis not present

## 2023-08-26 LAB — BASIC METABOLIC PANEL WITH GFR
Anion gap: 10 (ref 5–15)
Anion gap: 10 (ref 5–15)
BUN: 32 mg/dL — ABNORMAL HIGH (ref 8–23)
BUN: 28 mg/dL — ABNORMAL HIGH (ref 8–23)
CO2: 30 mmol/L (ref 22–32)
CO2: 30 mmol/L (ref 22–32)
Calcium: 8.8 mg/dL — ABNORMAL LOW (ref 8.9–10.3)
Calcium: 9.2 mg/dL (ref 8.9–10.3)
Chloride: 80 mmol/L — ABNORMAL LOW (ref 98–111)
Chloride: 80 mmol/L — ABNORMAL LOW (ref 98–111)
Creatinine, Ser: 0.64 mg/dL (ref 0.44–1.00)
Creatinine, Ser: 0.61 mg/dL (ref 0.44–1.00)
GFR, Estimated: >60 mL/min (ref >60)
GFR, Estimated: >60 mL/min (ref >60)
Glucose, Bld: 119 mg/dL — ABNORMAL HIGH (ref 70–99)
Glucose, Bld: 87 mg/dL (ref 70–99)
Potassium: 2.7 mmol/L — CL (ref 3.5–5.1)
Potassium: 3.8 mmol/L (ref 3.5–5.1)
Sodium: 120 mmol/L — ABNORMAL LOW (ref 135–145)
Sodium: 120 mmol/L — ABNORMAL LOW (ref 135–145)

## 2023-08-26 LAB — COMPREHENSIVE METABOLIC PANEL WITH GFR
ALT: 14 U/L (ref 0–44)
AST: 31 U/L (ref 15–41)
Albumin: 2.2 g/dL — ABNORMAL LOW (ref 3.5–5.0)
Alkaline Phosphatase: 84 U/L (ref 38–126)
Anion gap: 12 (ref 5–15)
BUN: 33 mg/dL — ABNORMAL HIGH (ref 8–23)
CO2: 33 mmol/L — ABNORMAL HIGH (ref 22–32)
Calcium: 8.8 mg/dL — ABNORMAL LOW (ref 8.9–10.3)
Chloride: 77 mmol/L — ABNORMAL LOW (ref 98–111)
Creatinine, Ser: 0.72 mg/dL (ref 0.44–1.00)
GFR, Estimated: >60 mL/min (ref >60)
Glucose, Bld: 80 mg/dL (ref 70–99)
Potassium: 2 mmol/L — CL (ref 3.5–5.1)
Sodium: 122 mmol/L — ABNORMAL LOW (ref 135–145)
Total Bilirubin: 2.5 mg/dL — ABNORMAL HIGH (ref 0.0–1.2)
Total Protein: 7.7 g/dL (ref 6.5–8.1)

## 2023-08-26 LAB — CBC
HCT: 34.9 % — ABNORMAL LOW (ref 36.0–46.0)
Hemoglobin: 11.1 g/dL — ABNORMAL LOW (ref 12.0–15.0)
MCH: 27.9 pg (ref 26.0–34.0)
MCHC: 31.8 g/dL (ref 30.0–36.0)
MCV: 87.7 fL (ref 80.0–100.0)
Platelets: 162 10*3/uL (ref 150–400)
RBC: 3.98 MIL/uL (ref 3.87–5.11)
RDW: 18.6 % — ABNORMAL HIGH (ref 11.5–15.5)
WBC: 20.6 10*3/uL — ABNORMAL HIGH (ref 4.0–10.5)
nRBC: 0 % (ref 0.0–0.2)

## 2023-08-26 LAB — GLUCOSE, CAPILLARY: Glucose-Capillary: 116 mg/dL — ABNORMAL HIGH (ref 70–99)

## 2023-08-26 LAB — MAGNESIUM: Magnesium: 2 mg/dL (ref 1.7–2.4)

## 2023-08-26 MED ORDER — POTASSIUM CHLORIDE 10 MEQ/100ML IV SOLN
10.0000 meq | INTRAVENOUS | Status: AC
  Administered 2023-08-26 (×6): 10 meq via INTRAVENOUS
  Filled 2023-08-26 (×6): qty 100

## 2023-08-26 MED ORDER — POTASSIUM CHLORIDE 20 MEQ PO PACK
60.0000 meq | PACK | Freq: Once | ORAL | Status: AC
  Administered 2023-08-26: 60 meq via ORAL
  Filled 2023-08-26: qty 3

## 2023-08-26 NOTE — Progress Notes (Signed)
 BSE completed, full report to follow.  Pt without h/o CVA per pt/family - but has been recently needing foods cut up and has been coughing with liquids per her statement.  Denies any h /o GERD or heartburn.  Pt presents with coughing with intake including water, and slightly thicker liquids as well as with foods *sandwich with meat.  Prolonged oral manipulation noted - ? Due to cognition.  Much improved tolerance of thin liquids via straw and very moist graham crackers.  SLP can not rule out aspiration - and given recent issues with swallowing recommend proceed with MBS to allow physiological eval of swallowing.  RN informed as well as pt and her spouse.  Pending MBS, recommend dys3/thin and meds with applesauce.  Please use straws as suspect it allows improved oral control.   MBS order placed.    [name], MS [facility] SLP Acute [employer] [phone]

## 2023-08-26 NOTE — Progress Notes (Signed)
   08/25/23 1600  Spiritual Encounters  Type of Visit Initial  Care provided to: Pt and family  Referral source Family  Reason for visit Routine spiritual support  OnCall Visit No  Interventions  Spiritual Care Interventions Made Established relationship of care and support;Compassionate presence;Prayer;Encouragement  Intervention Outcomes  Outcomes Reduced anxiety;Reduced fear;Awareness of support;Connection to spiritual care;Awareness around self/spiritual resourses;Patient family open to resources    Chaplain stopped in while rounding. Pt and family requested prayer, which chaplain happily offered. Chaplains remain available.

## 2023-08-26 NOTE — Progress Notes (Addendum)
  Progress Note   Patient: Morgan Odonnell:[id number] DOB: Feb 22, 1951 DOA: 08/15/2023     11 DOS: the patient was seen and examined on 08/26/2023   Brief hospital course: 22 YOF w/ hx of OHS/OSA, CHRF on 3 L O2, restrictive lung disease secondary to obesity, morbid obesity, HFpEF, hypothyroidism, mood disorder, chronic pain presented with concern for increased somnolence at home. At time of interview patient is awake alert and fully oriented.  She reports that her husband has been more concerned because over the past few days she has been sleeping more often.She reports that she has had worsening shortness of breath, chest pressure at rest, cough productive with white sputum over the past week.  No fever, chills.  She has chronic lymphedema thinks that her legs have become more swollen.  She has chronic orthopnea.  She has previously been intolerant of noninvasive ventilation with sleep and does not use at home.  Workup in the ED were notable for labs showed BNP 821 bicarb 31 hemoglobin 9 previously 13 g, chest x-ray with basilar atelectasis RT>LT. Blood gases pH 7.4 PCO278 pO2 less than 30 compensated picture Patient admitted for increased somnolence with acute on chronic hypoxic and hypercapnic respiratory failure.   Subjective: Patient seen and evaluated at the stepdown unit.  She was alert no acute distress.  Overnight she declined wearing BiPAP. She is currently on 7 L of high flow nasal cannula exceeding saturation between 93 and 94.  Blood pressure has improved this morning 139/48.  Pressors were discontinued yesterday evening  General: obese chronically ill-appearing female sitting up in bed with NRB in place, she is awake and alert  CVS: S1-S2, regular  Respiratory: Bibasilar Rales, diminished breath sounds bilaterally GI: NABS, soft, NT  LE: Marked lymphedema with hyperkeratosis and cracking of her skin   Assessment and plan:  Acute on chronic hypoxic and hypercapnic respiratory  failure  Obesity hypoventilation Probable pulmonary hypertension secondary to chronic hypoxia Restrictive lung disease from obesity Noncompliance with CPAP/OSA Pulmonary edema secondary to decompensated HFpEF - Continue BiPAP support. - Continue antibiotics for HCAP.  Cefepime  azithromycin  -Improved leukocytosis, afebrile -Patient will need BiPAP at discharge -Continue bronchodilators   Hypokalemia Critically low potassium at 2. Continue IV replacement  Persistent and worsening hyponatremia Likely multifactorial secondary to hypovolemia/total body water overload, ongoing naturesis/diuresis and possibly some level of SIADH from pulmonary abnormalities. Monitor trend  Chronic lymphedema Patient would benefit from wrapping  No evidence of infection  Chronic pain Patient remains on pregabalin  and as needed baclofen   Physical Exam: Vitals:   08/26/23 0735 08/26/23 0800 08/26/23 0848 08/26/23 0900  BP:  (!) 139/48    Pulse:  79  76  Resp:  19  18  Temp: (!) 97.5 F (36.4 C)     TempSrc: Axillary     SpO2:  94% 94% 93%  Weight:      Height:       General: obese chronically ill-appearing female sitting up in bed with NRB in place, she is awake and alert  CVS: S1-S2, regular  Respiratory: Bibasilar Rales, diminished breath sounds bilaterally GI: NABS, soft, NT  LE: Marked lymphedema with hyperkeratosis and cracking of her skin   DVT prophylaxis: Lovenox  Code Status: DNR/DNI Family Communication: No family at bedside Disposition: TBD   Time spent: 35 minutes  Author: Volney Grumbles, MD 08/26/2023 9:56 AM  For on call review [email or web address].

## 2023-08-26 NOTE — Evaluation (Addendum)
 Clinical/Bedside Swallow Evaluation Patient Details  Name: Morgan Odonnell MRN: [id number] Date of Birth: Oct 29, 1950  Today's Date: 08/26/2023 Time: SLP Start Time (ACUTE ONLY): 1239 SLP Stop Time (ACUTE ONLY): 1305 SLP Time Calculation (min) (ACUTE ONLY): 26 min  Past Medical History:  Past Medical History:  Diagnosis Date   Allergies    Anemia    Back pain    Bilateral swelling of feet    CHF (congestive heart failure) (HCC)    Chronic cough    COPD (chronic obstructive pulmonary disease) (HCC)    Depression    GERD (gastroesophageal reflux disease)    Heart murmur    Hypertension    Hypocapnia    Hypothyroid    Knee pain    Leg edema    chronic, bilateral   Lightheaded    Low back pain    Morbid obesity (HCC)    Neck pain    Obesity hypoventilation syndrome (HCC)    Oral lesion    Shortness of breath    Shortness of breath    Shoulder pain    Snoring    Past Surgical History:  Past Surgical History:  Procedure Laterality Date   APPENDECTOMY     CHOLECYSTECTOMY     COLONOSCOPY N/A 06/25/2013   Procedure: COLONOSCOPY;  Surgeon: Almeda Aris, MD;  Location: [facility] END[facility]COPY;  Service: Endoscopy;  Laterality: N/A;   ECTOPIC PREGNANCY SURGERY     Left Ovary Removed     MENISCUS REPAIR     TONSILLECTOMY     TUBAL LIGATION     HPI:  71 YOF w/ hx of OHS/OSA, CHRF on 3 L O2, restrictive lung disease secondary to obesity, morbid obesity, HFpEF, hypothyroidism, mood disorder, chronic pain presented with concern for increased somnolence at home. At time of interview patient is awake alert and fully oriented.  She reports that her husband has been more concerned because over the past few days she has been sleeping more often.She reports that she has had worsening shortness of breath, chest pressure at rest, cough productive with white sputum over the past week.  No fever, chills.  She has chronic lymphedema thinks that her legs have become more swollen.  She has chronic  orthopnea.  She has previously been intolerant of noninvasive ventilation with sleep and does not use at home.     Workup in the ED were notable for labs showed BNP 821 bicarb 31 hemoglobin 9 previously 13 g, chest x-ray with basilar atelectasis RT>LT. Blood gases pH 7.4 PCO278 pO2 less than 30 compensated picture Patient admitted for increased somnolence with acute on chronic hypoxic and hypercapnic respiratory failure.    Assessment / Plan / Recommendation  Clinical Impression  Per spouse, pt has been recently needing foods cut up and has been coughing with liquids per her statement.  Denies any h /o GERD or heartburn.  Pt presents with coughing with intake including water, and slightly thicker liquids as well as with foods *sandwich with meat.  Prolonged oral manipulation noted - ? Due to cognition.  Much improved tolerance of thin liquids via straw and very moist graham crackers.  SLP can not rule out aspiration - and given recent issues with swallowing recommend proceed with MBS to allow physiological eval of swallowing.  RN informed as well as pt and her spouse.  Pending MBS, recommend dys3/thin and meds with applesauce.  Please use straws as suspect it allows improved oral control.   MBS planned for 08/27/2023 at 0800  per radiology. RN informed.     SLP Visit Diagnosis: Dysphagia, pharyngeal phase (R13.13)    Aspiration Risk  No limitations    Diet Recommendation Dysphagia 3 (Mech soft);Thin liquid    Liquid Administration via: Straw (use straw) Supervision: Patient able to self feed Compensations: Slow rate;Small sips/bites Postural Changes: Remain upright for at least 30 minutes after po intake;Seated upright at 90 degrees    Other  Recommendations Oral Care Recommendations: Oral care BID     Assistance Recommended at Discharge  Full   Functional Status Assessment Patient has had a recent decline in their functional status and demonstrates the ability to make significant  improvements in function in a reasonable and predictable amount of time.  Frequency and Duration min 1 x/week  1 week       Prognosis Prognosis for improved oropharyngeal function: Fair Barriers to Reach Goals: Time post onset (current cognition)      Swallow Study   General Date of Onset: 08/26/23 HPI: 3 YOF w/ hx of OHS/OSA, CHRF on 3 L O2, restrictive lung disease secondary to obesity, morbid obesity, HFpEF, hypothyroidism, mood disorder, chronic pain presented with concern for increased somnolence at home. At time of interview patient is awake alert and fully oriented.  She reports that her husband has been more concerned because over the past few days she has been sleeping more often.She reports that she has had worsening shortness of breath, chest pressure at rest, cough productive with white sputum over the past week.  No fever, chills.  She has chronic lymphedema thinks that her legs have become more swollen.  She has chronic orthopnea.  She has previously been intolerant of noninvasive ventilation with sleep and does not use at home.     Workup in the ED were notable for labs showed BNP 821 bicarb 31 hemoglobin 9 previously 13 g, chest x-ray with basilar atelectasis RT>LT. Blood gases pH 7.4 PCO278 pO2 less than 30 compensated picture Patient admitted for increased somnolence with acute on chronic hypoxic and hypercapnic respiratory failure. Diet Prior to this Study: Regular;Thin liquids (Level 0) Temperature Spikes Noted: No Respiratory Status: Nasal cannula History of Recent Intubation: No Behavior/Cognition: Alert;Cooperative;Pleasant mood Oral Cavity Assessment: Within Functional Limits Oral Care Completed by SLP: No Oral Cavity - Dentition: Edentulous Vision: Functional for self-feeding Self-Feeding Abilities: Able to feed self Patient Positioning: Upright in bed Baseline Vocal Quality: Hoarse Volitional Cough: Weak Volitional Swallow: Able to elicit    Oral/Motor/Sensory  Function Overall Oral Motor/Sensory Function: Within functional limits   Ice Chips Ice chips: Within functional limits Presentation: Spoon   Thin Liquid Thin Liquid: Impaired Presentation: Straw;Cup;Self Fed Pharyngeal  Phase Impairments: Cough - Immediate    Nectar Thick Nectar Thick Liquid: Impaired Presentation: Cup;Straw;Spoon Pharyngeal Phase Impairments: Cough - Immediate   Honey Thick Honey Thick Liquid: Not tested   Puree Puree: Within functional limits Presentation: Self Fed;Spoon   Solid     Solid: Impaired Presentation: Self Fed Oral Phase Impairments: Reduced lingual movement/coordination;Impaired mastication;Poor awareness of bolus Oral Phase Functional Implications: Impaired mastication;Prolonged oral transit;Oral residue Pharyngeal Phase Impairments: Cough - Immediate;Cough - Delayed Other Comments: expectorating -      [name] Comment 08/26/2023,3:35 PM  [name], MS [facility] SLP Acute Rehab Services Office [phone]

## 2023-08-26 NOTE — Progress Notes (Signed)
 Physical Therapy Treatment Patient Details Name: Morgan Odonnell MRN: [id number] DOB: 1950/06/15 Today's Date: 08/26/2023   History of Present Illness 73 year old female admitted on 08/15/23 for increased somnolence with acute on chronic hypoxic and hypercapnic respiratory failure. PMHx: OHS/OSA, CHRF on 3 L O2, restrictive lung disease secondary to obesity, morbid obesity, HFpEF, hypothyroidism, mood disorder, chronic pain, lymphedema, CHF, hypoventilation syndrome, heart murmur    PT Comments   The patient is eager to mobilize. Mod assistance to sit up onto bed edge x ~ 8, Patient  presents with decreased strength and not deemed safe to stand this visit. Patient  maintained on 8 L HFNC, SPo2 dropped to 88%, especially during exertional movements. Disposition remains unknown. Patient will benefit from continued inpatient follow up therapy, <3 hours/day       If plan is discharge home, recommend the following: Two people to help with walking and/or transfers;A lot of help with bathing/dressing/bathroom;Assist for transportation;Supervision due to cognitive status   Can travel by private vehicle     No  Equipment Recommendations    none   Recommendations for Other Services       Precautions / Restrictions Precautions Precautions: Fall Precaution/Restrictions Comments: Fragile skin, On HFNC Restrictions Weight Bearing Restrictions Per Provider Order: No     Mobility  Bed Mobility Overal bed mobility: Needs Assistance Bed Mobility: Supine to Sit     Supine to sit: Mod assist, Used rails Sit to supine: Max assist, +2 for physical assistance, +2 for safety/equipment   General bed mobility comments: pt able to move LEs over EOB, mod support to sit upright. max assist of 2 to lift legs onto bed.    Transfers                   General transfer comment: patient not felt safe to attempt to stand, noted jerking of arms. patient sat x ~8    Ambulation/Gait                    Stairs             Wheelchair Mobility     Tilt Bed    Modified Rankin (Stroke Patients Only)       Balance Overall balance assessment: Needs assistance Sitting-balance support: Single extremity supported, Bilateral upper extremity supported Sitting balance-Leahy Scale: Fair Sitting balance - Comments: sat at midline x ` 8  , knee blocked  to ensure not sliding                                    Communication Communication Communication: Impaired Factors Affecting Communication: Reduced clarity of speech  Cognition Arousal: Alert Behavior During Therapy: WFL for tasks assessed/performed   PT - Cognitive impairments: Problem solving, Sequencing                       PT - Cognition Comments: difficult to understand speech Following commands: Impaired Following commands impaired: Follows one step commands inconsistently    Cueing    Exercises      General Comments        Pertinent Vitals/Pain Pain Assessment Faces Pain Scale: Hurts little more Pain Location: B LE with bed mobility Pain Descriptors / Indicators: Grimacing Pain Intervention(s): Monitored during session    Home Living  Prior Function            PT Goals (current goals can now be found in the care plan section) Progress towards PT goals: Not progressing toward goals - comment    Frequency    Min 2X/week      PT Plan      Co-evaluation              AM-PAC PT 6 Clicks Mobility   Outcome Measure  Help needed turning from your back to your side while in a flat bed without using bedrails?: A Lot Help needed moving from lying on your back to sitting on the side of a flat bed without using bedrails?: A Lot Help needed moving to and from a bed to a chair (including a wheelchair)?: Total Help needed standing up from a chair using your arms (e.g., wheelchair or bedside chair)?: Total Help needed to  walk in hospital room?: Total Help needed climbing 3-5 steps with a railing? : Total 6 Click Score: 8    End of Session   Activity Tolerance: Patient tolerated treatment well Patient left: in bed;with call bell/phone within reach Nurse Communication: Mobility status;Need for lift equipment PT Visit Diagnosis: Muscle weakness (generalized) (M62.81);Difficulty in walking, not elsewhere classified (R26.2)     Time: 1610-9604 PT Time Calculation (min) (ACUTE ONLY): 30 min  Charges:    $Therapeutic Activity: 23-37 mins PT General Charges $$ ACUTE PT VISIT: 1 Visit                     Abelina Hoes PT Acute Rehabilitation Services Office [phone]    Dareen Ebbing 08/26/2023, 4:03 PM

## 2023-08-26 NOTE — Progress Notes (Addendum)
 NAME:  Morgan Odonnell, MRN:  [id number], DOB:  26-Jan-1951, LOS: 11 ADMISSION DATE:  08/15/2023, CONSULTATION DATE:  08/20/2023 REFERRING MD:  Lesa Rape, CHIEF COMPLAINT: Acute on chronic hypercapnic respiratory failure  History of Present Illness:  73 year old female w/ sig h/o tobacco abuse, obesity, OHS/OSA COPD GOLD C and chronic hypoxic and Hypercarbic resp failure.  Admitted 6/6 w/ decreased MS and hypersomnolence Baseline serum bicarb 38, BNP elevated 800, congestion on CXR,  and VBG w/ PCO2 78, bicarb 51 and ph 7.42 Admitted w/ working dx of acute diastolic HF  Therapeutic interventions included: supplemental oxygen , aggressive diuresis and also was placed on NIPPV.  Her mental status improved, currently s/p > 10 liters diuresis w/ wt that went from 342 to 314 lbs from time of admit to 6/11 and was nearing discharge and PCCM asked to eval to assist w/ setting pt up w/ home nocturnal ventilatory assist for HS.    Baseline: lives w husband. Decreased activity ~ 1 yr. Sleeping in recliner/essentially living in recliner for several months. About a month ago was last time she was able to walk w/ significant assist from husband   Pertinent  Medical History  Obesity w/ associated restrictive lung disease, currently untreated OSA and OHS, prior smoker, chronic bronchitis, COPD FeV1 57% FeV1/FVC 70% baseline exertional dyspnea, chronic hypoxic & hypercarbic resp failure  HFpEF, hypothyroidism, chronic lymphedema, chronic pain, chronic orthopnea   Significant Hospital Events: Including procedures, antibiotic start and stop dates in addition to other pertinent events   6/6 admitted w/ acute on chronic diastolic HF, vol overload, hypoxic and hypercarbic resp failure  6/7 ECHO Left Ventricle: Left ventricular ejection fraction, by visual estimation, is 50 to 55%. The left ventricle has low normal function. The left ventricle is not well visualized. There is no left ventricular  hypertrophy. Left  ventricular diastolic parameters are consistent with Grade I diastolic dysfunction (impaired relaxation). Elevated left atrial pressure. Right Ventricle: The right ventricular size is normal. Mildly increased right ventricular wall thickness. Global RV systolic function is was not well visualized. Left Atrium: Left atrial size was mild-moderately dilated. Right Atrium: Right atrial size was moderately dilated  Pericardium: There is no evidence of pericardial effusion.  Mitral Valve: The mitral valve is normal in structure. Moderate mitral  annular calcification. No evidence of mitral valve regurgitation. No  evidence of mitral valve stenosis by observation. MV peak gradient, 10.1  mmHg. Tricuspid Valve: The tricuspid valve is grossly normal. Tricuspid valve regurgitation is not demonstrated.  6/11 pulm consulted  6/16 thirds of decreased responsiveness brought back to SDU for closer monitoring. Wbc spiked to 34 placed on cefepime  and azithro   Interim History / Subjective:  Yesterday more lethargic. Brought to SDU and placed on bipap. ABG 7.5, 53, 73, 41. CXR no significant change w/ persistent b/l pulmonary infiltrate. WBC spiked to 34 placed on cefepime  and azithro.  Refused bipap overnight. On 8L [place]. UOP 950 last 24 hours. Afebrile. Wbc down to 20.6 from 34.  Objective    Blood pressure (!) 134/51, pulse 77, temperature 97.7 F (36.5 C), temperature source Oral, resp. rate 18, height 5' 2 (1.575 m), weight 132.3 kg, SpO2 96%.        Intake/Output Summary (Last 24 hours) at 08/26/2023 0711 Last data filed at 08/25/2023 2345 Gross per 24 hour  Intake 1059.64 ml  Output 950 ml  Net 109.64 ml   Filed Weights   08/24/23 0507 08/25/23 1100 08/26/23 0500  Weight: 131 kg 132.3 kg 132.3 kg    Examination: General: NAD HEENT: MM pink/moist; [place] in place Neuro: Aox2; follows commands CV: s1s2, RRR, no m/r/g PULM:  dim BS bilaterally; [place] 8 l/m GI: soft, bsx4 active   Extremities: warm/dry, b/l wrapped with edema and chronic venous insufficiency   Resolved problem list   Assessment and Plan   Acute on chronic hypoxic and hypercapnic respiratory failure OHS/OSA noncompliance w/ cpap Pulmonary edema 2/2 to decompensated HFpEF Possible HCAP Gold 3 COPD Restrictive lung disease from obesity Plan: -educated patient on importance of wearing bipap overnight and with naps -cont [place] during day and wean for sats >92% -pulm toiletry: IS, flutter -pt/ot -patient coughing this morning with oral medications; will place slp eval to ensure not aspirating -cefepime /azithro for possible hcap; repeat mrsa pcr and consider vanc? -triple therapy inhalers; prn duoneb -given severe hypokalemia would hold morning diuretics and reassess electrolytes prior to reinitiating    CCT: NA  JD Vira Grieves [facility] 08/26/2023, 8:16 AM  Please see [email or web address] for pager details.  From 7A-7P if no response, please call [phone]. After hours, please call ELink [phone].

## 2023-08-26 NOTE — Progress Notes (Addendum)
 eLink Physician-Brief Progress Note Patient Name: Morgan Odonnell DOB: 03-21-1950 MRN: [id number]   Date of Service  08/26/2023  HPI/Events of Note  73 year old female admitted on 08/15/23 for increased somnolence with acute on chronic hypoxic and hypercapnic respiratory failure.  Now coughing and having thick pink-red sputum,  eICU Interventions  On appropriate antibiotics, continue observation.  If there is massive hemoptysis, may need to secure the airway but otherwise no Intervention   0228 - Kcl 20 MEQ  0500 - Additional Kcl 50  Intervention Category Minor Interventions: Routine modifications to care plan (e.g. PRN medications for pain, fever)  Morgan Odonnell 08/26/2023, 10:01 PM

## 2023-08-27 ENCOUNTER — Inpatient Hospital Stay (HOSPITAL_COMMUNITY)

## 2023-08-27 DIAGNOSIS — J9622 Acute and chronic respiratory failure with hypercapnia: Secondary | ICD-10-CM | POA: Diagnosis not present

## 2023-08-27 DIAGNOSIS — J449 Chronic obstructive pulmonary disease, unspecified: Secondary | ICD-10-CM | POA: Diagnosis not present

## 2023-08-27 DIAGNOSIS — G4733 Obstructive sleep apnea (adult) (pediatric): Secondary | ICD-10-CM | POA: Diagnosis not present

## 2023-08-27 DIAGNOSIS — J9621 Acute and chronic respiratory failure with hypoxia: Secondary | ICD-10-CM | POA: Diagnosis not present

## 2023-08-27 LAB — CBC
HCT: 38.9 % (ref 36.0–46.0)
Hemoglobin: 11.8 g/dL — ABNORMAL LOW (ref 12.0–15.0)
MCH: 27.4 pg (ref 26.0–34.0)
MCHC: 30.3 g/dL (ref 30.0–36.0)
MCV: 90.3 fL (ref 80.0–100.0)
Platelets: 157 10*3/uL (ref 150–400)
RBC: 4.31 MIL/uL (ref 3.87–5.11)
RDW: 18.4 % — ABNORMAL HIGH (ref 11.5–15.5)
WBC: 12.8 10*3/uL — ABNORMAL HIGH (ref 4.0–10.5)
nRBC: 0 % (ref 0.0–0.2)

## 2023-08-27 LAB — OSMOLALITY: Osmolality: 264 mosm/kg — ABNORMAL LOW (ref 275–295)

## 2023-08-27 LAB — URINALYSIS, ROUTINE W REFLEX MICROSCOPIC
Bacteria, UA: NONE SEEN
Bilirubin Urine: NEGATIVE
Glucose, UA: NEGATIVE mg/dL
Ketones, ur: NEGATIVE mg/dL
Leukocytes,Ua: NEGATIVE
Nitrite: NEGATIVE
Protein, ur: NEGATIVE mg/dL
Specific Gravity, Urine: 1.015 (ref 1.005–1.030)
pH: 6 (ref 5.0–8.0)

## 2023-08-27 LAB — OSMOLALITY, URINE: Osmolality, Ur: 498 mosm/kg (ref 300–900)

## 2023-08-27 LAB — BASIC METABOLIC PANEL WITH GFR
Anion gap: 9 (ref 5–15)
BUN: 24 mg/dL — ABNORMAL HIGH (ref 8–23)
CO2: 29 mmol/L (ref 22–32)
Calcium: 9.2 mg/dL (ref 8.9–10.3)
Chloride: 81 mmol/L — ABNORMAL LOW (ref 98–111)
Creatinine, Ser: 0.52 mg/dL (ref 0.44–1.00)
GFR, Estimated: >60 mL/min (ref >60)
Glucose, Bld: 87 mg/dL (ref 70–99)
Potassium: 3.2 mmol/L — ABNORMAL LOW (ref 3.5–5.1)
Sodium: 119 mmol/L — CL (ref 135–145)

## 2023-08-27 LAB — MAGNESIUM: Magnesium: 2 mg/dL (ref 1.7–2.4)

## 2023-08-27 LAB — SODIUM, URINE, RANDOM: Sodium, Ur: <10 mmol/L

## 2023-08-27 LAB — NA AND K (SODIUM & POTASSIUM), RAND UR
Potassium Urine: 62 mmol/L
Sodium, Ur: <10 mmol/L

## 2023-08-27 LAB — MRSA NEXT GEN BY PCR, NASAL: MRSA by PCR Next Gen: DETECTED — AB

## 2023-08-27 MED ORDER — UREA 15 G PO PACK
15.0000 g | PACK | Freq: Two times a day (BID) | ORAL | Status: DC
  Administered 2023-08-27 – 2023-09-01 (×10): 15 g via ORAL
  Filled 2023-08-27 (×12): qty 1

## 2023-08-27 MED ORDER — POTASSIUM CHLORIDE 10 MEQ/100ML IV SOLN
10.0000 meq | INTRAVENOUS | Status: AC
  Administered 2023-08-27 (×5): 10 meq via INTRAVENOUS
  Filled 2023-08-27 (×5): qty 100

## 2023-08-27 MED ORDER — POTASSIUM CHLORIDE 10 MEQ/100ML IV SOLN
10.0000 meq | INTRAVENOUS | Status: AC
  Administered 2023-08-27 (×2): 10 meq via INTRAVENOUS
  Filled 2023-08-27 (×2): qty 100

## 2023-08-27 NOTE — Progress Notes (Signed)
 Modified Barium Swallow Study  Patient Details  Name: Morgan Odonnell MRN: [id number] Date of Birth: January 28, 1951  Today's Date: 08/27/2023  Modified Barium Swallow completed.  Full report located under Chart Review in the Imaging Section.  History of Present Illness 36 YOF w/ hx of OHS/OSA, CHRF on 3 L O2, restrictive lung disease secondary to obesity, morbid obesity, HFpEF, hypothyroidism, mood disorder, chronic pain presented with concern for increased somnolence at home. At time of interview patient is awake alert and fully oriented. She reports that her husband has been more concerned because over the past few days she has been sleeping more often.She reports that she has had worsening shortness of breath, chest pressure at rest, cough productive with white sputum over the past week. No fever, chills. She has chronic lymphedema thinks that her legs have become more swollen. She has chronic orthopnea. She has previously been intolerant of noninvasive ventilation with sleep and does not use at home. Workup in the ED were notable for labs showed BNP 821 bicarb 31 hemoglobin 9 previously 13 g, chest x-ray with basilar atelectasis RT>LT. Blood gases pH 7.4 PCO278 pO2 less than 30 compensated picture Patient admitted for increased somnolence with acute on chronic hypoxic and hypercapnic respiratory failure per MD note.   Clinical Impression   Patient presents with moderate oral and minimal pharyngeal dysphagia without aspiration of any consistency tested. She demonstrates significant oral control impairments with lingual pumping, prolonged transiting with oral retention post-swallow without consistent awareness. Retention also adhered to hard palate - . Use of pudding helpful to transit masticated solids into pharynx. In addition, base of tongue retention noted without awareness. Laryngeal penetration of thin liquid occured secondary to poor oral control with premature spillage into pharynx and  decreased laryngeal closure - and this occured largely consistently. Chin tuck continued to allow penetration but appeared less so and if pt finds this strategy helpful, it is advised she utlize this with liquids. Barium tablet taken with pudding easily transited through oropharynx and appeared to clear esophagus without delay. No aspiration observed and pt did cough x1 during testing.   Dysphagia, unspecified (R13.10);  Frail or deconditioned;Dependence for feeding and/or oral hygiene;Poor general health and/or compromised immunity;Reduced cognitive function;  Limited visibility;Fatigue;   Factors that may increase risk of adverse event in presence of aspiration Morgan Odonnell & Morgan Odonnell 2021):    Swallow Evaluation Recommendations Recommendations: PO diet PO Diet Recommendation: Dysphagia 3 (Mechanical soft);Thin liquids (Level 0) Liquid Administration via: Straw;Cup Medication Administration: Other (Comment) (as tolerated) Supervision: Staff to assist with self-feeding Swallowing strategies  : Slow rate;Small bites/sips;Chin tuck (use puree to aid oral clearance of solid particles) Postural changes: Stay upright 30-60 min after meals;Out of bed for meals Oral care recommendations: Oral care QID (4x/day)    Morgan Sorrow, MS [facility] SLP Acute Rehab Services Office [phone]   Morgan Odonnell Comment 08/27/2023,2:16 PM

## 2023-08-27 NOTE — Progress Notes (Signed)
  Progress Note   Patient: Morgan Odonnell FAO:[id number] DOB: August 31, 1950 DOA: 08/15/2023     12 DOS: the patient was seen and examined on 08/27/2023   Brief hospital course: Hospital course:  26 YOF w/ hx of OHS/OSA, CHRF on 3 L O2, restrictive lung disease secondary to obesity, morbid obesity, HFpEF, hypothyroidism, mood disorder, chronic pain presented with concern for increased somnolence at home. At time of interview patient is awake alert and fully oriented.  She reports that her husband has been more concerned because over the past few days she has been sleeping more often.She reports that she has had worsening shortness of breath, chest pressure at rest, cough productive with white sputum over the past week.  No fever, chills.  She has chronic lymphedema thinks that her legs have become more swollen.  She has chronic orthopnea.  She has previously been intolerant of noninvasive ventilation with sleep and does not use at home.  Workup in the ED were notable for labs showed BNP 821 bicarb 31 hemoglobin 9 previously 13 g, chest x-ray with basilar atelectasis RT>LT. Blood gases pH 7.4 PCO278 pO2 less than 30 compensated picture Patient admitted for increased somnolence with acute on chronic hypoxic and hypercapnic respiratory failure.   Subjective: Patient seen at bedside this morning.  She was on 6 L of nasal cannula.  Alert and oriented x 3.  Blood pressure has been stable.   Exam: General: obese chronically ill-appearing female sitting up in bed with NRB in place, she is awake and alert but restless and somewhat confused.  She has moderately labored tachypnea. CVS: S1-S2, regular  Respiratory: Harsh breath sounds both inspiratory and expiratory with bilateral rales. GI: NABS, soft, NT  LE: Marked lymphedema with hyperkeratosis and cracking of her skin   Assessment and plan:  Acute on chronic hypoxic and hypercapnic respiratory failure  Obesity hypoventilation Probable pulmonary  hypertension secondary to chronic hypoxia Restrictive lung disease from obesity Noncompliance with CPAP/OSA Pulmonary edema secondary to decompensated HFpEF -She is currently on 6 L of nasal cannula with oxygen  saturations above 92. - Continue BiPAP support. - Continue antibiotics for HCAP.  Cefepime  azithromycin  -Improved leukocytosis, afebrile -Patient will need BiPAP at discharge - Holding diuretics due to ongoing hypotension. -Continue bronchodilators  Hypokalemia Replace intravenously.  Persistent and worsening hyponatremia Likely multifactorial secondary to hypovolemia/total body water overload, ongoing naturesis/diuresis and possibly some level of SIADH from pulmonary abnormalities. Sodium at 119, consult nephrology  Chronic lymphedema Patient would benefit from wrapping if clinically warranted No evidence of infection  Chronic pain Patient remains on pregabalin  and as needed baclofen    DVT prophylaxis: Lovenox  Code Status: DNR/DNI Family Communication: None at bedside Disposition: TBD   Physical Exam: Vitals:   08/27/23 0500 08/27/23 0600 08/27/23 0803 08/27/23 0830  BP: (!) 126/47 103/60    Pulse: 78 78    Resp: 20 20    Temp:   97.7 F (36.5 C)   TempSrc:   Oral   SpO2: 92% 95%  96%  Weight:      Height:         Time spent: 35 minutes  Author: Volney Grumbles, MD 08/27/2023 10:51 AM  For on call review [email or web address].

## 2023-08-27 NOTE — Consult Note (Signed)
 [facility]  HISTORY AND PHYSICAL  Morgan Odonnell is an 73 y.o. female.    Chief Complaint: SOB  HPI: Pt is a 76F with a  PMH sig for CHF, COPD on O2 at home, morbid obesity who is now seen in consultation at the request of Dr Alayne Allis for eval and recs re: hyponatremia.    Presented to [facility] with acute on chronic CHF exacerbation, and HCAP.  She was initially diuresed with IV Lasix .  Na initially 137 on admission, now 119.  In this setting we are asked to see.  Urine and serum lytes are pending.  Weights seem unreliable.    PMH: Past Medical History:  Diagnosis Date   Allergies    Anemia    Back pain    Bilateral swelling of feet    CHF (congestive heart failure) (HCC)    Chronic cough    COPD (chronic obstructive pulmonary disease) (HCC)    Depression    GERD (gastroesophageal reflux disease)    Heart murmur    Hypertension    Hypocapnia    Hypothyroid    Knee pain    Leg edema    chronic, bilateral   Lightheaded    Low back pain    Morbid obesity (HCC)    Neck pain    Obesity hypoventilation syndrome (HCC)    Oral lesion    Shortness of breath    Shortness of breath    Shoulder pain    Snoring    PSH: Past Surgical History:  Procedure Laterality Date   APPENDECTOMY     CHOLECYSTECTOMY     COLONOSCOPY N/A 06/25/2013   Procedure: COLONOSCOPY;  Surgeon: Almeda Aris, MD;  Location: [facility];  Service: Endoscopy;  Laterality: N/A;   ECTOPIC PREGNANCY SURGERY     Left Ovary Removed     MENISCUS REPAIR     TONSILLECTOMY     TUBAL LIGATION      Past Medical History:  Diagnosis Date   Allergies    Anemia    Back pain    Bilateral swelling of feet    CHF (congestive heart failure) (HCC)    Chronic cough    COPD (chronic obstructive pulmonary disease) (HCC)    Depression    GERD (gastroesophageal reflux disease)    Heart murmur    Hypertension    Hypocapnia    Hypothyroid    Knee pain    Leg edema    chronic, bilateral   Lightheaded     Low back pain    Morbid obesity (HCC)    Neck pain    Obesity hypoventilation syndrome (HCC)    Oral lesion    Shortness of breath    Shortness of breath    Shoulder pain    Snoring     Medications:  Scheduled:  azithromycin   250 mg Oral Daily   Chlorhexidine  Gluconate Cloth  6 each Topical Daily   citalopram   20 mg Oral Daily   enoxaparin  (LOVENOX ) injection  60 mg Subcutaneous Q24H   feeding supplement  237 mL Oral BID BM   ferrous sulfate   325 mg Oral BID WC   fluticasone  furoate-vilanterol  1 puff Inhalation Daily   Gerhardt's butt cream   Topical BID   hydrocerin   Topical BID   levothyroxine   200 mcg Oral Q0600   nicotine   21 mg Transdermal Daily   mouth rinse  15 mL Mouth Rinse 4 times per day   pregabalin   25 mg Oral BID   sodium chloride  flush  3 mL Intravenous Q12H   umeclidinium bromide   1 puff Inhalation Daily   urea   15 g Oral BID    Medications Prior to Admission  Medication Sig Dispense Refill   albuterol  (PROVENTIL  HFA;VENTOLIN  HFA) 108 (90 BASE) MCG/ACT inhaler Inhale 2 puffs into the lungs every 6 (six) hours as needed for wheezing.      aspirin  EC 81 MG EC tablet Take 1 tablet (81 mg total) by mouth daily. 30 tablet 0   baclofen  (LIORESAL ) 10 MG tablet Take 1 tablet by mouth 4 (four) times daily as needed for muscle spasms.      budesonide -formoterol  (SYMBICORT ) 160-4.5 MCG/ACT inhaler INHALE TWO puffs into lung TWICE DAILY 1 each 11   Cholecalciferol (VITAMIN D -3) 125 MCG (5000 UT) TABS Take 1 tablet by mouth daily.     citalopram  (CELEXA ) 20 MG tablet Take 1 tablet (20 mg total) by mouth daily. 30 tablet 0   cyclobenzaprine (FEXMID) 7.5 MG tablet Take 7.5 mg by mouth 3 (three) times daily.     diclofenac  sodium (VOLTAREN ) 1 % GEL Apply 1 application topically 4 (four) times daily. Applied to knees, shoulders, wrists, and ankles     furosemide  (LASIX ) 40 MG tablet 2 tablets (80mg ) by mouth two times a day (Patient taking differently: 2 tablets (80mg )  by mouth two times a day  Takes 2 tables in morning and 1 tab in evening) 120 tablet 1   levothyroxine  (SYNTHROID ) 200 MCG tablet Take 1 tablet (200 mcg total) by mouth daily before breakfast. 90 tablet 0   metoprolol  tartrate (LOPRESSOR ) 25 MG tablet Take 0.5 tablets (12.5 mg total) by mouth 2 (two) times daily. (Patient taking differently: Take 12.5 mg by mouth daily.) 30 tablet 0   naloxone  (NARCAN ) nasal spray 4 mg/0.1 mL Place 1 spray into the nose once.     nicotine  polacrilex (NICORETTE ) 4 MG gum Take 4 mg by mouth as needed for smoking cessation.     oxyCODONE -acetaminophen  (PERCOCET/ROXICET) 5-325 MG tablet Take 1 tablet by mouth every 6 (six) hours as needed for severe pain (pain score 7-10).     potassium chloride  SA (K-DUR,KLOR-CON ) 20 MEQ tablet 2 tablets (40 mEq) by mouth in the AM and 1 tablet (20 mEq) by mouth in the PM 90 tablet 0   pregabalin  (LYRICA ) 75 MG capsule Take 75 mg by mouth every 12 (twelve) hours.     propranolol  (INDERAL ) 10 MG tablet Take 10 mg by mouth 2 (two) times daily as needed.     RELISTOR 150 MG TABS Take 3 tablets by mouth daily.     tiotropium (SPIRIVA  HANDIHALER) 18 MCG inhalation capsule Place 1 capsule (18 mcg total) into inhaler and inhale daily. 30 capsule 11   Spacer/Aero-Holding Chambers (AEROCHAMBER Z-STAT PLUS CHAMBR) MISC Use as directed 1 each 0    ALLERGIES:   Allergies  Allergen Reactions   Gabapentin     slept for 2 days   Novocain [Procaine] Other (See Comments)    intolerance    FAM HX: Family History  Problem Relation Age of Onset   Ovarian cancer Mother    Lung disease Mother    Heart failure Mother    Hypertension Mother    Heart disease Mother    Seizures Mother    Cancer Mother    Depression Mother    Sleep apnea Mother    Alcoholism Mother    Eating disorder Mother  Obesity Mother    Hypertension Father    Eating disorder Father    Obesity Father    Anxiety disorder Father    Lymphoma Brother    Ovarian  cancer Maternal Aunt    Asthma Daughter     Social History:   reports that she quit smoking about 8 years ago. Her smoking use included cigarettes. She started smoking about 53 years ago. She has a 90 pack-year smoking history. She has never used smokeless tobacco. She reports that she does not drink alcohol and does not use drugs.  ROS: ROS: all other systems reviewed and are negative except as per HPI  Blood pressure 132/68, pulse 77, temperature 97.8 F (36.6 C), temperature source Oral, resp. rate (!) 22, height 5' 2 (1.575 m), weight 132.3 kg, SpO2 95%. PHYSICAL EXAM: Physical Exam GEN older woman, sitting in chair HEENT EOMI PERRL NECK thick neck PULM shallow lung volumes CV RRR ABD obese EXT 2+ LE edema with cobblestoning NEURO alert and awake SKIN very thin on arms, mult bruises, cobblestoning as above   Results for orders placed or performed during the hospital encounter of 08/15/23 (from the past 48 hours)  CBC     Status: Abnormal   Collection Time: 08/26/23  3:10 AM  Result Value Ref Range   WBC 20.6 (H) 4.0 - 10.5 K/uL   RBC 3.98 3.87 - 5.11 MIL/uL   Hemoglobin 11.1 (L) 12.0 - 15.0 g/dL   HCT 16.1 (L) 09.6 - 04.5 %   MCV 87.7 80.0 - 100.0 fL   MCH 27.9 26.0 - 34.0 pg   MCHC 31.8 30.0 - 36.0 g/dL   RDW 40.9 (H) 81.1 - 91.4 %   Platelets 162 150 - 400 K/uL   nRBC 0.0 0.0 - 0.2 %    Comment: Performed at [facility], [address] [place], [place] [address]  Comprehensive metabolic panel with GFR     Status: Abnormal   Collection Time: 08/26/23  3:10 AM  Result Value Ref Range   Sodium 122 (L) 135 - 145 mmol/L   Potassium 2.0 (LL) 3.5 - 5.1 mmol/L    Comment: CRITICAL RESULT CALLED TO, READ BACK BY AND VERIFIED WITH T. HALL, RN 08/26/23 0451 BY K. DAVIS   Chloride 77 (L) 98 - 111 mmol/L   CO2 33 (H) 22 - 32 mmol/L   Glucose, Bld 80 70 - 99 mg/dL    Comment: Glucose reference range applies only to samples taken after fasting for at least  8 hours.   BUN 33 (H) 8 - 23 mg/dL   Creatinine, Ser 6.21 0.44 - 1.00 mg/dL   Calcium  8.8 (L) 8.9 - 10.3 mg/dL   Total Protein 7.7 6.5 - 8.1 g/dL   Albumin 2.2 (L) 3.5 - 5.0 g/dL   AST 31 15 - 41 U/L   ALT 14 0 - 44 U/L   Alkaline Phosphatase 84 38 - 126 U/L   Total Bilirubin 2.5 (H) 0.0 - 1.2 mg/dL   GFR, Estimated >30 >86 mL/min    Comment: (NOTE) Calculated using the CKD-EPI Creatinine Equation (2021)    Anion gap 12 5 - 15    Comment: Performed at [facility], [address], [place], [place] [address]  Magnesium      Status: None   Collection Time: 08/26/23  3:10 AM  Result Value Ref Range   Magnesium  2.0 1.7 - 2.4 mg/dL    Comment: Performed at [facility],  [address] [place], [place] [address]  Basic metabolic panel     Status: Abnormal   Collection Time: 08/26/23  1:39 PM  Result Value Ref Range   Sodium 120 (L) 135 - 145 mmol/L   Potassium 2.7 (LL) 3.5 - 5.1 mmol/L    Comment: CRITICAL RESULT CALLED TO, READ BACK BY AND VERIFIED WITH SMALL,J. RN AT 1431 08/26/23 MULLINS,T    Chloride 80 (L) 98 - 111 mmol/L   CO2 30 22 - 32 mmol/L   Glucose, Bld 119 (H) 70 - 99 mg/dL    Comment: Glucose reference range applies only to samples taken after fasting for at least 8 hours.   BUN 32 (H) 8 - 23 mg/dL   Creatinine, Ser 3.24 0.44 - 1.00 mg/dL   Calcium  8.8 (L) 8.9 - 10.3 mg/dL   GFR, Estimated >40 >10 mL/min    Comment: (NOTE) Calculated using the CKD-EPI Creatinine Equation (2021)    Anion gap 10 5 - 15    Comment: Performed at [facility], [address] [place], [place] [address]  Glucose, capillary     Status: Abnormal   Collection Time: 08/26/23  3:24 PM  Result Value Ref Range   Glucose-Capillary 116 (H) 70 - 99 mg/dL    Comment: Glucose reference range applies only to samples taken after fasting for at least 8 hours.   Comment 1 Notify RN    Comment 2 Document in Chart   Basic metabolic panel      Status: Abnormal   Collection Time: 08/26/23  8:45 PM  Result Value Ref Range   Sodium 120 (L) 135 - 145 mmol/L   Potassium 3.8 3.5 - 5.1 mmol/L   Chloride 80 (L) 98 - 111 mmol/L   CO2 30 22 - 32 mmol/L   Glucose, Bld 87 70 - 99 mg/dL    Comment: Glucose reference range applies only to samples taken after fasting for at least 8 hours.   BUN 28 (H) 8 - 23 mg/dL   Creatinine, Ser 6.64 0.44 - 1.00 mg/dL   Calcium  9.2 8.9 - 10.3 mg/dL   GFR, Estimated >40 >34 mL/min    Comment: (NOTE) Calculated using the CKD-EPI Creatinine Equation (2021)    Anion gap 10 5 - 15    Comment: Performed at [facility], [address], [place], [place] [address]  MRSA Next Gen by PCR, Nasal     Status: Abnormal   Collection Time: 08/26/23 11:53 PM   Specimen: Nasal Mucosa; Nasal Swab  Result Value Ref Range   MRSA by PCR Next Gen DETECTED (A) NOT DETECTED    Comment: (NOTE) The GeneXpert MRSA Assay (FDA approved for NASAL specimens only), is one component of a comprehensive MRSA colonization surveillance program. It is not intended to diagnose MRSA infection nor to guide or monitor treatment for MRSA infections. Test performance is not FDA approved in patients less than 59 years old. Performed at [facility], [address], [place], [place] [address]   Basic metabolic panel     Status: Abnormal   Collection Time: 08/27/23  3:11 AM  Result Value Ref Range   Sodium 119 (LL) 135 - 145 mmol/L    Comment: CRITICAL RESULT CALLED TO, READ BACK BY AND VERIFIED WITH BYRD A. RN AT 0434 ON 08/27/2023 BY MECIAL J.    Potassium 3.2 (L) 3.5 - 5.1 mmol/L   Chloride 81 (L) 98 - 111 mmol/L   CO2 29  22 - 32 mmol/L   Glucose, Bld 87 70 - 99 mg/dL    Comment: Glucose reference range applies only to samples taken after fasting for at least 8 hours.   BUN 24 (H) 8 - 23 mg/dL   Creatinine, Ser 2.95 0.44 - 1.00 mg/dL   Calcium  9.2 8.9 - 10.3 mg/dL   GFR, Estimated >62 >13  mL/min    Comment: (NOTE) Calculated using the CKD-EPI Creatinine Equation (2021)    Anion gap 9 5 - 15    Comment: Performed at [facility], [address], [place], [place] [address]  CBC     Status: Abnormal   Collection Time: 08/27/23  3:11 AM  Result Value Ref Range   WBC 12.8 (H) 4.0 - 10.5 K/uL   RBC 4.31 3.87 - 5.11 MIL/uL   Hemoglobin 11.8 (L) 12.0 - 15.0 g/dL   HCT 84.6 96.2 - 95.2 %   MCV 90.3 80.0 - 100.0 fL   MCH 27.4 26.0 - 34.0 pg   MCHC 30.3 30.0 - 36.0 g/dL   RDW 84.1 (H) 32.4 - 40.1 %   Platelets 157 150 - 400 K/uL   nRBC 0.0 0.0 - 0.2 %    Comment: Performed at [facility], [address] [place], [place] [address]  Magnesium      Status: None   Collection Time: 08/27/23  3:11 AM  Result Value Ref Range   Magnesium  2.0 1.7 - 2.4 mg/dL    Comment: Performed at [facility], [address], [place], [place] [address]    DG Swallowing Func-Speech Pathology Result Date: 08/27/2023 Table formatting from the original result was not included. Modified Barium Swallow Study Patient Details Name: Morgan Odonnell MRN: [id number] Date of Birth: [birth year] Today's Date: 08/27/2023 HPI/PMH: HPI: 66 YOF w/ hx of OHS/OSA, CHRF on 3 L O2, restrictive lung disease secondary to obesity, morbid obesity, HFpEF, hypothyroidism, mood disorder, chronic pain presented with concern for increased somnolence at home. At time of interview patient is awake alert and fully oriented. She reports that her husband has been more concerned because over the past few days she has been sleeping more often.She reports that she has had worsening shortness of breath, chest pressure at rest, cough productive with white sputum over the past week. No fever, chills. She has chronic lymphedema thinks that her legs have become more swollen. She has chronic orthopnea. She has previously been intolerant of noninvasive ventilation with sleep and does not use at  home. Workup in the ED were notable for labs showed BNP 821 bicarb 31 hemoglobin 9 previously 13 g, chest x-ray with basilar atelectasis RT>LT. Blood gases pH 7.4 PCO278 pO2 less than 30 compensated picture Patient admitted for increased somnolence with acute on chronic hypoxic and hypercapnic respiratory failure per MD note. Clinical Impression: Clinical Impression: Patient presents with moderate oral and minimal pharyngeal dysphagia without aspiration of any consistency tested. She demonstrates significant oral control impairments with lingual pumping, prolonged transiting with oral retention post-swallow without consistent awareness.  Retention also adhered to hard palate - . Use of pudding helpful to transit masticated solids into pharynx.   In addition, base of tongue retention noted without awareness.  Laryngeal penetration of thin liquid occured secondary to poor oral control with premature spillage into pharynx and decreased laryngeal closure - and this occured largely consistently.  Chin tuck continued to allow penetration but appeared less so and if pt finds this strategy helpful, it is  advised she utlize this with liquids. Barium tablet taken with pudding easily transited through oropharynx and appeared to clear esophagus without delay.  No aspiration observed and pt did cough x1 during testing. Factors that may increase risk of adverse event in presence of aspiration [name] & [name] 2021): Factors that may increase risk of adverse event in presence of aspiration [name] & [name] 2021): Frail or deconditioned; Dependence for feeding and/or oral hygiene; Poor general health and/or compromised immunity; Reduced cognitive function Recommendations/Plan: Swallowing Evaluation Recommendations Swallowing Evaluation Recommendations Recommendations: PO diet PO Diet Recommendation: Dysphagia 3 (Mechanical soft); Thin liquids (Level 0) Liquid Administration via: Straw; Cup Medication Administration: Other  (Comment) (as tolerated) Supervision: Staff to assist with self-feeding Swallowing strategies  : Slow rate; Small bites/sips; Chin tuck (use puree to aid oral clearance of solid particles) Postural changes: Stay upright 30-60 min after meals; Out of bed for meals Oral care recommendations: Oral care QID (4x/day) Treatment Plan Treatment Plan Treatment recommendations: Therapy as outlined in treatment plan below Functional status assessment: Patient has had a recent decline in their functional status and demonstrates the ability to make significant improvements in function in a reasonable and predictable amount of time. Treatment frequency: Min 1x/week Treatment duration: 2 weeks Interventions: Aspiration precaution training; Respiratory muscle strength training; Compensatory techniques; Patient/family education; Diet toleration management by SLP Recommendations Recommendations for follow up therapy are one component of a multi-disciplinary discharge planning process, led by the attending physician.  Recommendations may be updated based on patient status, additional functional criteria and insurance authorization. Assessment: Orofacial Exam: Orofacial Exam Oral Cavity: Oral Hygiene: WFL Oral Cavity - Dentition: Missing dentition Orofacial Anatomy: WFL Oral Motor/Sensory Function: Generalized oral weakness Anatomy: Anatomy: WFL Boluses Administered: Boluses Administered Boluses Administered: Thin liquids (Level 0); Mildly thick liquids (Level 2, nectar thick); Puree; Solid  Oral Impairment Domain: Oral Impairment Domain Lip Closure: Escape from interlabial space or lateral juncture, no extension beyond vermillion border Tongue control during bolus hold: Escape to lateral buccal cavity/floor of mouth Bolus preparation/mastication: Slow prolonged chewing/mashing with complete recollection Bolus transport/lingual motion: Repetitive/disorganized tongue motion Oral residue: Residue collection on oral structures Location of  oral residue : Tongue Initiation of pharyngeal swallow : Pyriform sinuses; Posterior laryngeal surface of the epiglottis  Pharyngeal Impairment Domain: Pharyngeal Impairment Domain Soft palate elevation: No bolus between soft palate (SP)/pharyngeal wall (PW) Laryngeal elevation: Partial superior movement of thyroid  cartilage/partial approximation of arytenoids to epiglottic petiole Anterior hyoid excursion: Partial anterior movement Epiglottic movement: Complete inversion Laryngeal vestibule closure: Incomplete, narrow column air/contrast in laryngeal vestibule Pharyngeal stripping wave : Present - diminished Pharyngeal contraction (A/P view only): N/A Pharyngoesophageal segment opening: Partial distention/partial duration, partial obstruction of flow Tongue base retraction: Trace column of contrast or air between tongue base and PPW Pharyngeal residue: Collection of residue within or on pharyngeal structures Location of pharyngeal residue: Valleculae  Esophageal Impairment Domain: Esophageal Impairment Domain Esophageal clearance upright position: Complete clearance, esophageal coating Pill: Pill Consistency administered: Puree Penetration/Aspiration Scale Score: Penetration/Aspiration Scale Score 1.  Material does not enter airway: Mildly thick liquids (Level 2, nectar thick); Moderately thick liquids (Level 3, honey thick); Puree; Solid; Pill 2.  Material enters airway, remains ABOVE vocal cords then ejected out: Thin liquids (Level 0) Compensatory Strategies: Compensatory Strategies Compensatory strategies: Yes Other(comment): Ineffective (count 1,2,3.. swallow; chin tuck, cue to organize and swallow, reflexive dry swallows weak and ineffective)   General Information: Caregiver present: Yes  Diet Prior to this Study: Regular; Thin liquids (Level 0)  Temperature : Normal   Respiratory Status: WFL   Supplemental O2: Nasal cannula   History of Recent Intubation: No  Behavior/Cognition: Alert; Cooperative;  Distractible Self-Feeding Abilities: Dependent for feeding Baseline vocal quality/speech: Hypophonia/low volume Volitional Cough: Unable to elicit Volitional Swallow: Unable to elicit (did not conduct consistently) Exam Limitations: Limited visibility; Fatigue Goal Planning: Prognosis for improved oropharyngeal function: Fair Barriers to Reach Goals: Time post onset; Overall medical prognosis; Cognitive deficits No data recorded Patient/Family Stated Goal: none stated, spouse present Consulted and agree with results and recommendations: Patient; Family member/caregiver Pain: Pain Assessment Pain Assessment: No/denies pain Faces Pain Scale: 4 Breathing: 0 Negative Vocalization: 0 Facial Expression: 0 Body Language: 0 Consolability: 0 PAINAD Score: 0 Pain Location: B LE with bed mobility Pain Descriptors / Indicators: Grimacing Pain Intervention(s): Limited activity within patient's tolerance; Monitored during session End of Session: Start Time:SLP Start Time (ACUTE ONLY): 1201 Stop Time: SLP Stop Time (ACUTE ONLY): 1231 Time Calculation:SLP Time Calculation (min) (ACUTE ONLY): 30 min Charges: SLP Evaluations $ SLP Speech Visit: 1 Visit SLP Evaluations $BSS Swallow: 1 Procedure $MBS Swallow: 1 Procedure $Swallowing Treatment: 1 Procedure SLP visit diagnosis: SLP Visit Diagnosis: Dysphagia, unspecified (R13.10) Past Medical History: Past Medical History: Diagnosis Date  Allergies   Anemia   Back pain   Bilateral swelling of feet   CHF (congestive heart failure) (HCC)   Chronic cough   COPD (chronic obstructive pulmonary disease) (HCC)   Depression   GERD (gastroesophageal reflux disease)   Heart murmur   Hypertension   Hypocapnia   Hypothyroid   Knee pain   Leg edema   chronic, bilateral  Lightheaded   Low back pain   Morbid obesity (HCC)   Neck pain   Obesity hypoventilation syndrome (HCC)   Oral lesion   Shortness of breath   Shortness of breath   Shoulder pain   Snoring  Past Surgical History: Past Surgical History:  Procedure Laterality Date  APPENDECTOMY    CHOLECYSTECTOMY    COLONOSCOPY N/A 06/25/2013  Procedure: COLONOSCOPY;  Surgeon: Almeda Aris, MD;  Location: [facility];  Service: Endoscopy;  Laterality: N/A;  ECTOPIC PREGNANCY SURGERY    Left Ovary Removed    MENISCUS REPAIR    TONSILLECTOMY    TUBAL LIGATION   [name], MS [facility] SLP Acute Rehab Services Office [phone] [name] Comment 08/27/2023, 2:18 PM   Assessment/Plan  Hyponatremia:  - 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  HCAP:  - cefepime / azithro  3.  Acute on chronic hypoxic RF:   - likely d/t HCAP + OHS +/- fluid  4.  Dispo: pending  Leandra Pro 08/27/2023, 3:48 PM

## 2023-08-27 NOTE — Progress Notes (Signed)
 Speech Language Pathology Treatment: Dysphagia  Patient Details Name: Morgan Odonnell MRN: [id number] DOB: 1950/04/19 Today's Date: 08/27/2023 Time: 4098-1191 SLP Time Calculation (min) (ACUTE ONLY): 23 min  Assessment / Plan / Recommendation Clinical Impression  Pt seen for skilled SlP to determine if MBS remains clinically indicated. Spouse in room and pt's breakfast tray by sink. SLP faciliated session by functional observation with po - including implementing compensation strategies. Pt assisted with thin and eggs initially. Prolonged oral manipulation of all boluses noted despite cues for pt to organize and swallow. She required longer than 2 minutes to initially swallow egg bolus - however SLP obtained warm applesauce to more efficiently transit masticated solids effectively. Pt unfortunately is continuting to demonstrate weak cough after nearly all po trials despite use of straw and faciliated oral transiting. She does have diagnosis of chronic cough per chart review, but can not rule out aspiration component.   Suspect cognitive based dysphagia with prolonged oral transiting, poor lingual coordination and potentially airway infiltration prior to swallow. Pt required more than 2 minutes to swallow a small bolus of eggs - which will be concerning for nutrition adequacy for healing. Spouse reports pt's swishing and swallowing is new with this admission - counting 1,2,3... swallow was not conducted despite cues - ? cognition?   Pt also conducting chin tuck posture independently during swallowing liquids - but then also neck extension with solids - presumed self created compensation strategies as pt/spouse deny pt being informed to conduct this strategy.   At this time recommend to continue DYS3/THIN with precautions to maximize swallow efficiency and safety. Potentially maximizing liquid nutrition supplements may be helpful as well. Using teach back, discussed with pt and spouse to new  compensation strategy recommendation - which spouse was able to return demonstration.   MBS planned for approx noon today - RN and xray on board with plan. Pt currrently on six liters of oxygen , therefore she is able to travel to xray - but if oxygen  needs incr to HFNC, she is unable to to go xray for MBS.   HPI HPI: 5 YOF w/ hx of OHS/OSA, CHRF on 3 L O2, restrictive lung disease secondary to obesity, morbid obesity, HFpEF, hypothyroidism, mood disorder, chronic pain presented with concern for increased somnolence at home. At time of interview patient is awake alert and fully oriented.  She reports that her husband has been more concerned because over the past few days she has been sleeping more often.She reports that she has had worsening shortness of breath, chest pressure at rest, cough productive with white sputum over the past week.  No fever, chills.  She has chronic lymphedema thinks that her legs have become more swollen.  She has chronic orthopnea.  She has previously been intolerant of noninvasive ventilation with sleep and does not use at home.     Workup in the ED were notable for labs showed BNP 821 bicarb 31 hemoglobin 9 previously 13 g, chest x-ray with basilar atelectasis RT>LT. Blood gases pH 7.4 PCO278 pO2 less than 30 compensated picture Patient admitted for increased somnolence with acute on chronic hypoxic and hypercapnic respiratory failure per MD note.      SLP Plan  MBS          Recommendations  Diet recommendations: Dysphagia 3 (mechanical soft) Liquids provided via: Straw Medication Administration: Whole meds with puree Supervision: Full supervision/cueing for compensatory strategies Compensations: Small sips/bites;Slow rate;Other (Comment) (use puree to orally transit masticated solids) Postural Changes and/or Swallow  Maneuvers: Seated upright 90 degrees;Upright 30-60 min after meal                      Frequent or constant Supervision/Assistance Dysphagia,  unspecified (R13.10)     MBS   [name], MS [facility] SLP Acute Rehab Services Office [phone]   [name] Comment  08/27/2023, 9:47 AM

## 2023-08-27 NOTE — Progress Notes (Signed)
   08/27/23 1010  Spiritual Encounters  Type of Visit Initial  Care provided to: Pt and family   While rounding on unit and in follow up to [facility] on 6/17, I attempted to visit with Morgan Odonnell. Her spouse was at bedside.  At time of visit, Morgan Odonnell was receiving a call from their son. I simply introduced myself and let Morgan Odonnell know of my availability to offer ongoing support.  Will continue to follow; please page if needed.  Koran Seabrook L. Minetta Aly, M.Div [phone]

## 2023-08-27 NOTE — Progress Notes (Signed)
 NAME:  Morgan Odonnell, MRN:  [id number], DOB:  October 30, 1950, LOS: 12 ADMISSION DATE:  08/15/2023, CONSULTATION DATE:  08/20/2023 REFERRING MD:  Bobbetta Burnet - TRH, CHIEF COMPLAINT: Acute-on-chronic hypercapnic respiratory failure  History of Present Illness:  72 year old woman admitted to [facility] 6/6 with decreased responsiveness, AMS and hypersomnolence. PMHx significant for tobacco abuse, obesity, chronic hypoxic and hypercarbic resp failure, OHS/OSA, COPD GOLD C.  At baseline, patient lives with husband. Decreased activity ~ 1 yr. Sleeping in recliner/essentially living in recliner for several months. About a month PTA was last time she was able to walk with significant assistance from husband.  Baseline serum bicarb 38, BNP elevated 800, +congestion on CXR; VBG w/ pCO2 78, bicarb 51 and pH 7.42. Admitted w/ working dx of acute diastolic HF. Therapeutic interventions included: supplemental oxygen , aggressive diuresis, NIPPV.  Mental status improved, currently s/p > 10L diuresis (weight 342 to 314 lbs from time of admit to 6/11).   Patient was nearing discharge and PCCM asked to evaluate/assist with setting up home nocturnal ventilatory assistance.  Pertinent Medical History:  Obesity w/ associated restrictive lung disease, currently untreated OSA and OHS, prior smoker, chronic bronchitis, COPD FeV1 57% FeV1/FVC 70% baseline exertional dyspnea, chronic hypoxic & hypercarbic resp failure; HFpEF, hypothyroidism, chronic lymphedema, chronic pain, chronic orthopnea   Significant Hospital Events: Including procedures, antibiotic start and stop dates in addition to other pertinent events   6/6 - Admitted w/ acute on chronic diastolic HF, vol overload, hypoxic and hypercarbic resp failure  6/7 - Echo with LVEF 50 to 55%, low normal function; G1DD (impaired relaxation), elevated left atrial pressure, normal RV/RV function. 6/11 - PCCM consulted.  6/16 - Decreased responsiveness, brought back to SDU for closer  monitoring. WBC spiked to 34, placed on cefepime  and azithro.  Interim History / Subjective:  No significant events overnight Some cough with thick, pink-tinged sputum overnight Remains on 6LNC when awake, BiPAP otherwise MBS today without significant aspiration, sounds like more issue with prolonged oropharyngeal holding of food bolus/needing cues to swallow Ok for DYS 3 diet/thin liquids Na 119, holding further diuresis today Requested lymphedema wraps  Objective:   Blood pressure 103/60, pulse 78, temperature 97.7 F (36.5 C), temperature source Oral, resp. rate 20, height 5' 2 (1.575 m), weight 132.3 kg, SpO2 96%.        Intake/Output Summary (Last 24 hours) at 08/27/2023 0943 Last data filed at 08/27/2023 7829 Gross per 24 hour  Intake 1370.16 ml  Output 1700 ml  Net -329.84 ml   Filed Weights   08/24/23 0507 08/25/23 1100 08/26/23 0500  Weight: 131 kg 132.3 kg 132.3 kg   Physical Examination: General: Acute-on-chronically ill-appearing older woman in NAD. Up to chair at bedside via lift. HEENT: [place]/AT, anicteric sclera, PERRL, moist mucous membranes. Neuro: Drowsy/somnolent, will wake with significant stimulation. Responds to verbal stimuli. Following commands consistently. Generalized weakness.  CV: RRR, no m/g/r. PULM: Breathing even and unlabored on 6LNC. Lung fields diminished throughout. GI: Obese, soft, nontender, nondistended. Normoactive bowel sounds. Extremities: Bilateral 3-4+ chronic-appearing woody edema noted to BLE. Skin: Warm/dry, BLE woody skin changes c/w chronic venous insufficiency noted as above.  Resolved problem List:   Assessment and Plan:   Acute-on-chronic hypoxic and hypercapnic respiratory failure OHS/OSA noncompliance with CPAP Pulmonary edema 2/2 to decompensated HFpEF Possible HCAP Gold 3 COPD Restrictive lung disease from obesity - BiPAP QHS and with naps PRN - Supplemental O2 support as needed to maintain goal SpO2 - Wean FiO2 for  O2 sat >  90% - Triple therapy bronchodilators + DuoNeb PRN - Pulmonary hygiene (IS/flutter), encourage mobilization as able with PT/OT - Continue broad-spectrum antibiotics (cefepime /azithromycin , low threshold to add vanc with +MRSA PCR, though WBC significantly improved today 6/18) - MBS today r/o aspiration; liberalize diet to DYS 3 per SLP - Diuresis as tolerated, would hold today 6/18 given Na 119  Signature:   [place], [place] [facility] 08/27/23 9:43 AM  Please see [email or web address] for pager details.  From 7A-7P if no response, please call [phone] After hours, please call ELink [phone]

## 2023-08-27 NOTE — Progress Notes (Signed)
 Occupational Therapy Treatment Patient Details Name: Morgan Odonnell MRN: [id number] DOB: May 02, 1950 Today's Date: 08/27/2023   History of present illness 73 year old female admitted on 08/15/23 for increased somnolence with acute on chronic hypoxic and hypercapnic respiratory failure. PMHx: OHS/OSA, CHRF on 3 L O2, restrictive lung disease secondary to obesity, morbid obesity, HFpEF, hypothyroidism, mood disorder, chronic pain, lymphedema, CHF, hypoventilation syndrome, heart murmur   OT comments  Patient was +2 for movement in bed for hygiene tasks and to position total lift pad. Patient noted to have off topic comments during session but patient able to answer orientation questions appropriately. Patients husband was present during session as well. Patient will benefit from continued inpatient follow up therapy, <3 hours/day. Patient's discharge plan remains appropriate at this time. OT will continue to follow acutely.        If plan is discharge home, recommend the following:  A lot of help with bathing/dressing/bathroom;Assistance with cooking/housework;Help with stairs or ramp for entrance;A little help with walking and/or transfers;A lot of help with walking and/or transfers   Equipment Recommendations  None recommended by OT       Precautions / Restrictions Precautions Precautions: Fall Precaution/Restrictions Comments: Fragile skin, On HFNC Restrictions Weight Bearing Restrictions Per Provider Order: No       Mobility Bed Mobility Overal bed mobility: Needs Assistance Bed Mobility: Rolling Rolling: Max assist, +2 for physical assistance, +2 for safety/equipment, Used rails         General bed mobility comments: pt required min A to roll to the L, max A x 2 to roll to the R, pt required min to mod A to maintain sidelying for hygine and total lift pad placement          ADL either performed or assessed with clinical judgement   ADL Overall ADL's : Needs  assistance/impaired                             Toileting- Clothing Manipulation and Hygiene: Bed level;Total assistance Toileting - Clothing Manipulation Details (indicate cue type and reason): patient was +3 to roll in bed with increased time. easier to roll to L side v.s. R side to get total lift under pad. patient was able to transition into recliner with +3 to position.             Communication Communication Communication: Impaired Factors Affecting Communication: Reduced clarity of speech   Cognition Arousal: Alert Behavior During Therapy: WFL for tasks assessed/performed Cognition: Difficult to assess             OT - Cognition Comments: patient knew she was in [place], her name and hubands name and birth date. patient did make off topic statements multiple times during session with difficulty redirecting.                 Following commands: Impaired Following commands impaired: Follows one step commands with increased time      Cueing   Cueing Techniques: Verbal cues, Gestural cues, Tactile cues, Visual cues  Exercises              Pertinent Vitals/ Pain       Pain Assessment Pain Assessment: No/denies pain Faces Pain Scale: Hurts little more Pain Location: B LE with bed mobility Pain Descriptors / Indicators: Grimacing Pain Intervention(s): Limited activity within patient's tolerance, Monitored during session         Frequency  Min 2X/week  Progress Toward Goals  OT Goals(current goals can now be found in the care plan section)  Progress towards OT goals: OT to reassess next treatment     Plan      Co-evaluation      Reason for Co-Treatment: For patient/therapist safety;Complexity of the patient's impairments (multi-system involvement);Other (comment) PT goals addressed during session: Mobility/safety with mobility OT goals addressed during session: ADL's and self-care      AM-PAC OT 6 Clicks Daily Activity      Outcome Measure   Help from another person eating meals?: None Help from another person taking care of personal grooming?: A Little Help from another person toileting, which includes using toliet, bedpan, or urinal?: Total Help from another person bathing (including washing, rinsing, drying)?: A Lot Help from another person to put on and taking off regular upper body clothing?: A Little Help from another person to put on and taking off regular lower body clothing?: Total 6 Click Score: 14    End of Session Equipment Utilized During Treatment: Oxygen   OT Visit Diagnosis: Unsteadiness on feet (R26.81);Muscle weakness (generalized) (M62.81);Pain;Other abnormalities of gait and mobility (R26.89)   Activity Tolerance Patient limited by fatigue   Patient Left in chair;with call bell/phone within reach;with bed alarm set;with family/visitor present;with nursing/sitter in room   Nurse Communication          Time: 1020-1046 OT Time Calculation (min): 26 min  Charges: OT General Charges $OT Visit: 1 Visit OT Treatments $Therapeutic Activity: 8-22 mins  Wynette Heckler, MS Acute Rehabilitation Department Office# [phone]   Jame Maze 08/27/2023, 1:31 PM

## 2023-08-27 NOTE — Plan of Care (Signed)
  Problem: Elimination: Goal: Will not experience complications related to bowel motility Outcome: Progressing   Problem: Elimination: Goal: Will not experience complications related to urinary retention Outcome: Progressing

## 2023-08-27 NOTE — Progress Notes (Addendum)
 Physical Therapy Treatment Patient Details Name: MAIREAD SCHWARZKOPF MRN: [id number] DOB: April 13, 1950 Today's Date: 08/27/2023   History of Present Illness 73 year old female admitted on 08/15/23 for increased somnolence with acute on chronic hypoxic and hypercapnic respiratory failure. PMHx: OHS/OSA, CHRF on 3 L O2, restrictive lung disease secondary to obesity, morbid obesity, HFpEF, hypothyroidism, mood disorder, chronic pain, lymphedema, CHF, hypoventilation syndrome, heart murmur    PT Comments   Pt admitted with above diagnosis.  Pt currently with functional limitations due to the deficits listed below (see PT Problem List).  Pt in bed when therapist arrived. Pt agreeable to therapy. Spouse present. Pt oriented to self, spouse and location. Pt to have swallow study today. Pt required min A to roll to the L with cues and A for UE placement, max A x 2 to roll to the R with cues and facilitation for UE placement, pt required assist for rolling tasks for removal of bed pan, hygiene and placement of total lift. Pt required skimaxi lift for transfer bed to recliner. Pt left in recliner, nursing staff and spouse present. Pt indicated R UE discomfort with mobility.  Pt will benefit from acute skilled PT to increase their independence and safety with mobility to allow discharge.   Pt on 6 HFNC and desaturated to 81% with rolling tasks, pt able to recover in supine/semi reclined with cues for pursed lip breathing to 93% with reports of SOB.     If plan is discharge home, recommend the following: Two people to help with walking and/or transfers;Assist for transportation;Supervision due to cognitive status;Two people to help with bathing/dressing/bathroom   Can travel by private vehicle     No  Equipment Recommendations  None recommended by PT    Recommendations for Other Services       Precautions / Restrictions Precautions Precautions: Fall Precaution/Restrictions Comments: Fragile skin, On  HFNC Restrictions Weight Bearing Restrictions Per Provider Order: No     Mobility  Bed Mobility Overal bed mobility: Needs Assistance Bed Mobility: Rolling Rolling: Max assist, +2 for physical assistance, +2 for safety/equipment, Used rails         General bed mobility comments: pt required min A to roll to the L, max A x 2 to roll to the R, pt required min to mod A to maintain sidelying for hygine and total lift pad placement    Transfers Overall transfer level: Needs assistance   Transfers: Bed to chair/wheelchair/BSC               Transfer via Lift Equipment: [place]  Ambulation/Gait                   Stairs             Wheelchair Mobility     Tilt Bed    Modified Rankin (Stroke Patients Only)       Balance Overall balance assessment: Needs assistance                                          Communication Communication Communication: Impaired Factors Affecting Communication: Reduced clarity of speech  Cognition Arousal: Alert Behavior During Therapy: WFL for tasks assessed/performed   PT - Cognitive impairments: Problem solving, Sequencing                         Following commands: Impaired  Following commands impaired: Follows one step commands with increased time    Cueing Cueing Techniques: Verbal cues, Gestural cues, Tactile cues, Visual cues  Exercises      General Comments        Pertinent Vitals/Pain Pain Assessment Pain Assessment: Faces Faces Pain Scale: Hurts little more Breathing: normal Negative Vocalization: none Facial Expression: smiling or inexpressive Body Language: relaxed Consolability: no need to console PAINAD Score: 0 Pain Location: B LE with bed mobility Pain Descriptors / Indicators: Grimacing Pain Intervention(s): Monitored during session    Home Living                          Prior Function            PT Goals (current goals can now be found in  the care plan section) Acute Rehab PT Goals PT Goal Formulation: With patient/family Time For Goal Achievement: 08/31/23 Potential to Achieve Goals: Good Progress towards PT goals: Not progressing toward goals - comment    Frequency    Min 2X/week      PT Plan      Co-evaluation PT/OT/SLP Co-Evaluation/Treatment: Yes Reason for Co-Treatment: For patient/therapist safety;Complexity of the patient's impairments (multi-system involvement);Other (comment) PT goals addressed during session: Mobility/safety with mobility OT goals addressed during session: ADL's and self-care      AM-PAC PT 6 Clicks Mobility   Outcome Measure  Help needed turning from your back to your side while in a flat bed without using bedrails?: A Lot Help needed moving from lying on your back to sitting on the side of a flat bed without using bedrails?: Total Help needed moving to and from a bed to a chair (including a wheelchair)?: Total Help needed standing up from a chair using your arms (e.g., wheelchair or bedside chair)?: Total Help needed to walk in hospital room?: Total Help needed climbing 3-5 steps with a railing? : Total 6 Click Score: 7    End of Session   Activity Tolerance: Treatment limited secondary to medical complications (Comment);Patient limited by fatigue (SOB) Patient left: in bed;with nursing/sitter in room Nurse Communication: Mobility status;Need for lift equipment PT Visit Diagnosis: Muscle weakness (generalized) (M62.81);Difficulty in walking, not elsewhere classified (R26.2)     Time: 1020-1046 PT Time Calculation (min) (ACUTE ONLY): 26 min  Charges:      PT General Charges $$ ACUTE PT VISIT: 1 Visit                     Cary Clarks, PT Acute Rehab    [name] 08/27/2023, 12:14 PM

## 2023-08-28 DIAGNOSIS — J9622 Acute and chronic respiratory failure with hypercapnia: Secondary | ICD-10-CM | POA: Diagnosis not present

## 2023-08-28 DIAGNOSIS — J449 Chronic obstructive pulmonary disease, unspecified: Secondary | ICD-10-CM | POA: Diagnosis not present

## 2023-08-28 DIAGNOSIS — G4733 Obstructive sleep apnea (adult) (pediatric): Secondary | ICD-10-CM | POA: Diagnosis not present

## 2023-08-28 DIAGNOSIS — J9621 Acute and chronic respiratory failure with hypoxia: Secondary | ICD-10-CM | POA: Diagnosis not present

## 2023-08-28 LAB — BASIC METABOLIC PANEL WITH GFR
Anion gap: 9 (ref 5–15)
BUN: 19 mg/dL (ref 8–23)
CO2: 30 mmol/L (ref 22–32)
Calcium: 9.1 mg/dL (ref 8.9–10.3)
Chloride: 82 mmol/L — ABNORMAL LOW (ref 98–111)
Creatinine, Ser: 0.49 mg/dL (ref 0.44–1.00)
GFR, Estimated: >60 mL/min (ref >60)
Glucose, Bld: 84 mg/dL (ref 70–99)
Potassium: 3.3 mmol/L — ABNORMAL LOW (ref 3.5–5.1)
Sodium: 121 mmol/L — ABNORMAL LOW (ref 135–145)

## 2023-08-28 LAB — CBC
HCT: 38.3 % (ref 36.0–46.0)
Hemoglobin: 11.5 g/dL — ABNORMAL LOW (ref 12.0–15.0)
MCH: 27.6 pg (ref 26.0–34.0)
MCHC: 30 g/dL (ref 30.0–36.0)
MCV: 92.1 fL (ref 80.0–100.0)
Platelets: 145 10*3/uL — ABNORMAL LOW (ref 150–400)
RBC: 4.16 MIL/uL (ref 3.87–5.11)
RDW: 18.4 % — ABNORMAL HIGH (ref 11.5–15.5)
WBC: 9.8 10*3/uL (ref 4.0–10.5)
nRBC: 0 % (ref 0.0–0.2)

## 2023-08-28 MED ORDER — POTASSIUM CHLORIDE 20 MEQ PO PACK
20.0000 meq | PACK | Freq: Two times a day (BID) | ORAL | Status: DC
  Administered 2023-08-28 – 2023-08-30 (×5): 20 meq via ORAL
  Filled 2023-08-28 (×5): qty 1

## 2023-08-28 MED ORDER — POTASSIUM CHLORIDE 10 MEQ/100ML IV SOLN
10.0000 meq | INTRAVENOUS | Status: AC
  Administered 2023-08-28 (×6): 10 meq via INTRAVENOUS
  Filled 2023-08-28 (×6): qty 100

## 2023-08-28 MED ORDER — FUROSEMIDE 10 MG/ML IJ SOLN
40.0000 mg | Freq: Once | INTRAMUSCULAR | Status: AC
  Administered 2023-08-28: 40 mg via INTRAVENOUS
  Filled 2023-08-28: qty 4

## 2023-08-28 NOTE — Progress Notes (Signed)
 Morgan Odonnell is an 73 y.o. female with a history of HFpEF, COPD on home O2, morbid obesity, lymphedema, H CAP here with acute on chronic CHF exacerbation initially diuresed with Lasix  40 mg twice daily.  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  With a UrOsm 498 Lasix  may still help to decrease concentration gradient.             - started ureNa BID on 6/18             - daily weight continue             - Strict I/O -> very good UOP             - placed fluid restriction             - does not need 3% at present or [place]   2.  HCAP:             - cefepime / azithro   3.  Acute on chronic hypoxic RF:              - likely d/t HCAP + OHS +/- fluid   4.  Dispo: pending  Subjective: Patient denies any nausea, vomiting, thirst; updated spouse who is bedside.  Patient is not drinking too much water but has a very poor appetite.   Chemistry and CBC: Creatinine, Ser  Date/Time Value Ref Range Status  08/28/2023 03:27 AM 0.49 0.44 - 1.00 mg/dL Final  16/12/9602 54:09 AM 0.52 0.44 - 1.00 mg/dL Final  81/19/1478 29:56 PM 0.61 0.44 - 1.00 mg/dL Final  21/30/8657 84:69 PM 0.64 0.44 - 1.00 mg/dL Final  62/95/2841 32:44 AM 0.72 0.44 - 1.00 mg/dL Final  03/13/7251 66:44 AM 1.13 (H) 0.44 - 1.00 mg/dL Final  03/47/4259 56:38 PM 1.16 (H) 0.44 - 1.00 mg/dL Final  75/64/3329 51:88 PM 0.93 0.44 - 1.00 mg/dL Final  41/66/0630 16:01 PM 0.88 0.44 - 1.00 mg/dL Final  09/32/3557 32:20 AM 0.73 0.44 - 1.00 mg/dL Final  25/42/7062 37:62 AM 0.62 0.44 - 1.00 mg/dL Final  83/15/1761 60:73 AM 0.67 0.44 - 1.00 mg/dL Final  71/08/2692 85:46 AM 0.66 0.44 - 1.00 mg/dL Final   27/05/5007 38:18 AM 0.49 0.44 - 1.00 mg/dL Final  29/93/7169 67:89 PM 0.44 0.44 - 1.00 mg/dL Final  38/12/1749 02:58 AM 0.55 0.44 - 1.00 mg/dL Final  52/77/8242 35:36 AM 0.57 0.44 - 1.00 mg/dL Final  14/43/1540 08:67 PM 0.69 0.44 - 1.00 mg/dL Final  61/95/0932 67:12 PM 1.04 (H) 0.44 - 1.00 mg/dL Final  45/80/9983 38:25 PM 1.00 0.44 - 1.00 mg/dL Final  05/39/7673 41:93 PM 0.83 0.57 - 1.00 mg/dL Final  79/04/4095 35:32 PM 1.00 0.57 - 1.00 mg/dL Final  99/24/2683 41:96 PM 0.96 0.57 - 1.00 mg/dL Final  22/29/7989 21:19 AM 0.84 0.44 - 1.00 mg/dL Final  41/74/0814 48:18 AM 0.95 0.44 - 1.00 mg/dL Final  56/31/4970 26:37 AM 0.96 0.44 - 1.00 mg/dL Final  85/88/5027 74:12 AM 0.96 0.44 - 1.00 mg/dL Final  87/86/7672 09:47 AM 1.09 (H) 0.44 -  1.00 mg/dL Final  16/12/9602 54:09 PM 1.12 (H) 0.44 - 1.00 mg/dL Final  81/19/1478 29:56 AM 1.18 (H) 0.44 - 1.00 mg/dL Final  21/30/8657 84:69 PM 1.48 (H) 0.44 - 1.00 mg/dL Final  62/95/2841 32:44 AM 1.67 (H) 0.44 - 1.00 mg/dL Final  03/13/7251 66:44 PM 2.21 (H) 0.44 - 1.00 mg/dL Final  03/47/4259 56:38 AM 2.59 (H) 0.44 - 1.00 mg/dL Final  75/64/3329 51:88 AM 2.19 (H) 0.44 - 1.00 mg/dL Final  41/66/0630 16:01 PM 2.09 (H) 0.44 - 1.00 mg/dL Final  09/32/3557 32:20 PM 0.93 0.44 - 1.00 mg/dL Final  25/42/7062 37:62 PM 0.93 0.44 - 1.00 mg/dL Final  83/15/1761 60:73 AM 1.12 (H) 0.44 - 1.00 mg/dL Final  71/08/2692 85:46 AM 1.20 (H) 0.44 - 1.00 mg/dL Final  27/05/5007 38:18 AM 0.81 0.44 - 1.00 mg/dL Final  29/93/7169 67:89 AM 0.81 0.44 - 1.00 mg/dL Final  38/12/1749 02:58 PM 1.0 0.4 - 1.2 mg/dL Final  52/77/8242 35:36 AM 0.87 0.50 - 1.10 mg/dL Final  14/43/1540 08:67 PM 1.3 (H) 0.4 - 1.2 mg/dL Final  61/95/0932 67:12 PM 1.1 0.4 - 1.2 mg/dL Final  45/80/9983 38:25 PM 0.9 0.4 - 1.2 mg/dL Final  05/39/7673 41:93 AM 0.63 0.4 - 1.2 mg/dL Final   Recent Labs  Lab 08/24/23 2130 08/25/23 0345 08/26/23 0310 08/26/23 1339 08/26/23 2045 08/27/23 0311 08/28/23 0327   NA 120* 120* 122* 120* 120* 119* 121*  K 3.5 3.2* 2.0* 2.7* 3.8 3.2* 3.3*  CL 71* 75* 77* 80* 80* 81* 82*  CO2 34* 33* 33* 30 30 29 30   GLUCOSE 108* 100* 80 119* 87 87 84  BUN 36* 40* 33* 32* 28* 24* 19  CREATININE 1.16* 1.13* 0.72 0.64 0.61 0.52 0.49  CALCIUM  9.1 8.9 8.8* 8.8* 9.2 9.2 9.1   Recent Labs  Lab 08/25/23 0345 08/26/23 0310 08/27/23 0311 08/28/23 0327  WBC 34.7* 20.6* 12.8* 9.8  NEUTROABS 30.8*  --   --   --   HGB 11.6* 11.1* 11.8* 11.5*  HCT 36.8 34.9* 38.9 38.3  MCV 89.5 87.7 90.3 92.1  PLT 183 162 157 145*   Liver Function Tests: Recent Labs  Lab 08/24/23 1307 08/26/23 0310  AST 44* 31  ALT 17 14  ALKPHOS 85 84  BILITOT 3.7* 2.5*  PROT 8.7* 7.7  ALBUMIN 2.5* 2.2*   No results for input(s): LIPASE, AMYLASE in the last 168 hours. No results for input(s): AMMONIA in the last 168 hours. Cardiac Enzymes: No results for input(s): CKTOTAL, CKMB, CKMBINDEX, TROPONINI in the last 168 hours. Iron Studies: No results for input(s): IRON, TIBC, TRANSFERRIN, FERRITIN in the last 72 hours. PT/INR: @LABRCNTIP (inr:5)  Xrays/Other Studies: ) Results for orders placed or performed during the hospital encounter of 08/15/23 (from the past 48 hours)  Glucose, capillary     Status: Abnormal   Collection Time: 08/26/23  3:24 PM  Result Value Ref Range   Glucose-Capillary 116 (H) 70 - 99 mg/dL    Comment: Glucose reference range applies only to samples taken after fasting for at least 8 hours.   Comment 1 Notify RN    Comment 2 Document in Chart   Basic metabolic panel     Status: Abnormal   Collection Time: 08/26/23  8:45 PM  Result Value Ref Range   Sodium 120 (L) 135 - 145 mmol/L   Potassium 3.8 3.5 - 5.1 mmol/L   Chloride 80 (L) 98 - 111 mmol/L   CO2 30 22 - 32 mmol/L  Glucose, Bld 87 70 - 99 mg/dL    Comment: Glucose reference range applies only to samples taken after fasting for at least 8 hours.   BUN 28 (H) 8 - 23 mg/dL    Creatinine, Ser 1.61 0.44 - 1.00 mg/dL   Calcium  9.2 8.9 - 10.3 mg/dL   GFR, Estimated >09 >60 mL/min    Comment: (NOTE) Calculated using the CKD-EPI Creatinine Equation (2021)    Anion gap 10 5 - 15    Comment: Performed at [facility], [address], [place], [place] [address]  MRSA Next Gen by PCR, Nasal     Status: Abnormal   Collection Time: 08/26/23 11:53 PM   Specimen: Nasal Mucosa; Nasal Swab  Result Value Ref Range   MRSA by PCR Next Gen DETECTED (A) NOT DETECTED    Comment: (NOTE) The GeneXpert MRSA Assay (FDA approved for NASAL specimens only), is one component of a comprehensive MRSA colonization surveillance program. It is not intended to diagnose MRSA infection nor to guide or monitor treatment for MRSA infections. Test performance is not FDA approved in patients less than 62 years old. Performed at [facility], [address] [place], [place] [address]   Basic metabolic panel     Status: Abnormal   Collection Time: 08/27/23  3:11 AM  Result Value Ref Range   Sodium 119 (LL) 135 - 145 mmol/L    Comment: CRITICAL RESULT CALLED TO, READ BACK BY AND VERIFIED WITH BYRD A. RN AT 0434 ON 08/27/2023 BY MECIAL J.    Potassium 3.2 (L) 3.5 - 5.1 mmol/L   Chloride 81 (L) 98 - 111 mmol/L   CO2 29 22 - 32 mmol/L   Glucose, Bld 87 70 - 99 mg/dL    Comment: Glucose reference range applies only to samples taken after fasting for at least 8 hours.   BUN 24 (H) 8 - 23 mg/dL   Creatinine, Ser 4.78 0.44 - 1.00 mg/dL   Calcium  9.2 8.9 - 10.3 mg/dL   GFR, Estimated >29 >56 mL/min    Comment: (NOTE) Calculated using the CKD-EPI Creatinine Equation (2021)    Anion gap 9 5 - 15    Comment: Performed at [facility], [address] [place], [place] [address]  CBC     Status: Abnormal   Collection Time: 08/27/23  3:11 AM  Result Value Ref Range   WBC 12.8 (H) 4.0 - 10.5 K/uL   RBC 4.31 3.87 - 5.11 MIL/uL   Hemoglobin  11.8 (L) 12.0 - 15.0 g/dL   HCT 65.7 84.6 - 96.2 %   MCV 90.3 80.0 - 100.0 fL   MCH 27.4 26.0 - 34.0 pg   MCHC 30.3 30.0 - 36.0 g/dL   RDW 95.2 (H) 84.1 - 32.4 %   Platelets 157 150 - 400 K/uL   nRBC 0.0 0.0 - 0.2 %    Comment: Performed at [facility], [address] [place], [place] [address]  Magnesium      Status: None   Collection Time: 08/27/23  3:11 AM  Result Value Ref Range   Magnesium  2.0 1.7 - 2.4 mg/dL    Comment: Performed at [facility], [address] [place], [place] [address]  Osmolality     Status: Abnormal   Collection Time: 08/27/23  1:52 PM  Result Value Ref Range   Osmolality 264 (L) 275 - 295 mOsm/kg    Comment: REPEATED TO VERIFY Performed at [facility], [address], [place], [place] [address]   Na and K (sodium & potassium), rand urine     Status: None   Collection Time: 08/27/23  7:04 PM  Result Value Ref Range   Sodium, Ur <10 mmol/L   Potassium Urine 62 mmol/L    Comment: Performed at [facility], [address], [place], [place] [address]  Osmolality, urine     Status: None   Collection Time: 08/27/23  7:04 PM  Result Value Ref Range   Osmolality, Ur 498 300 - 900 mOsm/kg    Comment: Performed at [facility], [address], [place], [place] [address]  Urinalysis, Routine w reflex microscopic -Urine, Clean Catch     Status: Abnormal   Collection Time: 08/27/23  7:33 PM  Result Value Ref Range   Color, Urine YELLOW YELLOW   APPearance CLEAR CLEAR   Specific Gravity, Urine 1.015 1.005 - 1.030   pH 6.0 5.0 - 8.0   Glucose, UA NEGATIVE NEGATIVE mg/dL   Hgb urine dipstick SMALL (A) NEGATIVE   Bilirubin Urine NEGATIVE NEGATIVE   Ketones, ur NEGATIVE NEGATIVE mg/dL   Protein, ur NEGATIVE NEGATIVE mg/dL   Nitrite NEGATIVE NEGATIVE   Leukocytes,Ua NEGATIVE NEGATIVE   RBC / HPF 6-10 0 - 5 RBC/hpf   WBC, UA 0-5 0 - 5 WBC/hpf   Bacteria, UA NONE SEEN NONE SEEN   Squamous  Epithelial / HPF 0-5 0 - 5 /HPF   Mucus PRESENT    Hyaline Casts, UA PRESENT     Comment: Performed at [facility], [address] [place], [place] [address]  Sodium, urine, random     Status: None   Collection Time: 08/27/23  7:33 PM  Result Value Ref Range   Sodium, Ur <10 mmol/L    Comment: Performed at [facility], [address] [place], [place] [address]  CBC     Status: Abnormal   Collection Time: 08/28/23  3:27 AM  Result Value Ref Range   WBC 9.8 4.0 - 10.5 K/uL   RBC 4.16 3.87 - 5.11 MIL/uL   Hemoglobin 11.5 (L) 12.0 - 15.0 g/dL   HCT 13.0 86.5 - 78.4 %   MCV 92.1 80.0 - 100.0 fL   MCH 27.6 26.0 - 34.0 pg   MCHC 30.0 30.0 - 36.0 g/dL   RDW 69.6 (H) 29.5 - 28.4 %   Platelets 145 (L) 150 - 400 K/uL   nRBC 0.0 0.0 - 0.2 %    Comment: Performed at [facility], [address], [place], [place] [address]  Basic metabolic panel     Status: Abnormal   Collection Time: 08/28/23  3:27 AM  Result Value Ref Range   Sodium 121 (L) 135 - 145 mmol/L   Potassium 3.3 (L) 3.5 - 5.1 mmol/L   Chloride 82 (L) 98 - 111 mmol/L   CO2 30 22 - 32 mmol/L   Glucose, Bld 84 70 - 99 mg/dL    Comment: Glucose reference range applies only to samples taken after fasting for at least 8 hours.   BUN 19 8 - 23 mg/dL   Creatinine, Ser 0.10 0.44 - 1.00 mg/dL   Calcium  9.1 8.9 - 10.3 mg/dL   GFR, Estimated >27 >25 mL/min    Comment: (NOTE) Calculated using the CKD-EPI Creatinine Equation (2021)    Anion gap 9 5 - 15    Comment: Performed at [facility],  [address], [place], [place] [address]   DG Swallowing Func-Speech Pathology Result Date: 08/27/2023 Table formatting from the original result was not included. Modified Barium Swallow Study Patient Details Name: Morgan Odonnell MRN: [id number] Date of Birth: 05-31-1950 Today's Date: 08/27/2023 HPI/PMH: HPI: 31 YOF w/ hx of OHS/OSA, CHRF on 3 L O2, restrictive lung  disease secondary to obesity, morbid obesity, HFpEF, hypothyroidism, mood disorder, chronic pain presented with concern for increased somnolence at home. At time of interview patient is awake alert and fully oriented. She reports that her husband has been more concerned because over the past few days she has been sleeping more often.She reports that she has had worsening shortness of breath, chest pressure at rest, cough productive with white sputum over the past week. No fever, chills. She has chronic lymphedema thinks that her legs have become more swollen. She has chronic orthopnea. She has previously been intolerant of noninvasive ventilation with sleep and does not use at home. Workup in the ED were notable for labs showed BNP 821 bicarb 31 hemoglobin 9 previously 13 g, chest x-ray with basilar atelectasis RT>LT. Blood gases pH 7.4 PCO278 pO2 less than 30 compensated picture Patient admitted for increased somnolence with acute on chronic hypoxic and hypercapnic respiratory failure per MD note. Clinical Impression: Clinical Impression: Patient presents with moderate oral and minimal pharyngeal dysphagia without aspiration of any consistency tested. She demonstrates significant oral control impairments with lingual pumping, prolonged transiting with oral retention post-swallow without consistent awareness.  Retention also adhered to hard palate - . Use of pudding helpful to transit masticated solids into pharynx.   In addition, base of tongue retention noted without awareness.  Laryngeal penetration of thin liquid occured secondary to poor oral control with premature spillage into pharynx and decreased laryngeal closure - and this occured largely consistently.  Chin tuck continued to allow penetration but appeared less so and if pt finds this strategy helpful, it is advised she utlize this with liquids. Barium tablet taken with pudding easily transited through oropharynx and appeared to clear esophagus without  delay.  No aspiration observed and pt did cough x1 during testing. Factors that may increase risk of adverse event in presence of aspiration [name] & [name] 2021): Factors that may increase risk of adverse event in presence of aspiration [name] & [name] 2021): Frail or deconditioned; Dependence for feeding and/or oral hygiene; Poor general health and/or compromised immunity; Reduced cognitive function Recommendations/Plan: Swallowing Evaluation Recommendations Swallowing Evaluation Recommendations Recommendations: PO diet PO Diet Recommendation: Dysphagia 3 (Mechanical soft); Thin liquids (Level 0) Liquid Administration via: Straw; Cup Medication Administration: Other (Comment) (as tolerated) Supervision: Staff to assist with self-feeding Swallowing strategies  : Slow rate; Small bites/sips; Chin tuck (use puree to aid oral clearance of solid particles) Postural changes: Stay upright 30-60 min after meals; Out of bed for meals Oral care recommendations: Oral care QID (4x/day) Treatment Plan Treatment Plan Treatment recommendations: Therapy as outlined in treatment plan below Functional status assessment: Patient has had a recent decline in their functional status and demonstrates the ability to make significant improvements in function in a reasonable and predictable amount of time. Treatment frequency: Min 1x/week Treatment duration: 2 weeks Interventions: Aspiration precaution training; Respiratory muscle strength training; Compensatory techniques; Patient/family education; Diet toleration management by SLP Recommendations Recommendations for follow up therapy are one component of a multi-disciplinary discharge planning process, led by the attending physician.  Recommendations may be updated based on patient status, additional functional criteria and insurance authorization.  Assessment: Orofacial Exam: Orofacial Exam Oral Cavity: Oral Hygiene: WFL Oral Cavity - Dentition: Missing dentition Orofacial Anatomy:  WFL Oral Motor/Sensory Function: Generalized oral weakness Anatomy: Anatomy: WFL Boluses Administered: Boluses Administered Boluses Administered: Thin liquids (Level 0); Mildly thick liquids (Level 2, nectar thick); Puree; Solid  Oral Impairment Domain: Oral Impairment Domain Lip Closure: Escape from interlabial space or lateral juncture, no extension beyond vermillion border Tongue control during bolus hold: Escape to lateral buccal cavity/floor of mouth Bolus preparation/mastication: Slow prolonged chewing/mashing with complete recollection Bolus transport/lingual motion: Repetitive/disorganized tongue motion Oral residue: Residue collection on oral structures Location of oral residue : Tongue Initiation of pharyngeal swallow : Pyriform sinuses; Posterior laryngeal surface of the epiglottis  Pharyngeal Impairment Domain: Pharyngeal Impairment Domain Soft palate elevation: No bolus between soft palate (SP)/pharyngeal wall (PW) Laryngeal elevation: Partial superior movement of thyroid  cartilage/partial approximation of arytenoids to epiglottic petiole Anterior hyoid excursion: Partial anterior movement Epiglottic movement: Complete inversion Laryngeal vestibule closure: Incomplete, narrow column air/contrast in laryngeal vestibule Pharyngeal stripping wave : Present - diminished Pharyngeal contraction (A/P view only): N/A Pharyngoesophageal segment opening: Partial distention/partial duration, partial obstruction of flow Tongue base retraction: Trace column of contrast or air between tongue base and PPW Pharyngeal residue: Collection of residue within or on pharyngeal structures Location of pharyngeal residue: Valleculae  Esophageal Impairment Domain: Esophageal Impairment Domain Esophageal clearance upright position: Complete clearance, esophageal coating Pill: Pill Consistency administered: Puree Penetration/Aspiration Scale Score: Penetration/Aspiration Scale Score 1.  Material does not enter airway: Mildly thick  liquids (Level 2, nectar thick); Moderately thick liquids (Level 3, honey thick); Puree; Solid; Pill 2.  Material enters airway, remains ABOVE vocal cords then ejected out: Thin liquids (Level 0) Compensatory Strategies: Compensatory Strategies Compensatory strategies: Yes Other(comment): Ineffective (count 1,2,3.. swallow; chin tuck, cue to organize and swallow, reflexive dry swallows weak and ineffective)   General Information: Caregiver present: Yes  Diet Prior to this Study: Regular; Thin liquids (Level 0)   Temperature : Normal   Respiratory Status: WFL   Supplemental O2: Nasal cannula   History of Recent Intubation: No  Behavior/Cognition: Alert; Cooperative; Distractible Self-Feeding Abilities: Dependent for feeding Baseline vocal quality/speech: Hypophonia/low volume Volitional Cough: Unable to elicit Volitional Swallow: Unable to elicit (did not conduct consistently) Exam Limitations: Limited visibility; Fatigue Goal Planning: Prognosis for improved oropharyngeal function: Fair Barriers to Reach Goals: Time post onset; Overall medical prognosis; Cognitive deficits No data recorded Patient/Family Stated Goal: none stated, spouse present Consulted and agree with results and recommendations: Patient; Family member/caregiver Pain: Pain Assessment Pain Assessment: No/denies pain Faces Pain Scale: 4 Breathing: 0 Negative Vocalization: 0 Facial Expression: 0 Body Language: 0 Consolability: 0 PAINAD Score: 0 Pain Location: B LE with bed mobility Pain Descriptors / Indicators: Grimacing Pain Intervention(s): Limited activity within patient's tolerance; Monitored during session End of Session: Start Time:SLP Start Time (ACUTE ONLY): 1201 Stop Time: SLP Stop Time (ACUTE ONLY): 1231 Time Calculation:SLP Time Calculation (min) (ACUTE ONLY): 30 min Charges: SLP Evaluations $ SLP Speech Visit: 1 Visit SLP Evaluations $BSS Swallow: 1 Procedure $MBS Swallow: 1 Procedure $Swallowing Treatment: 1 Procedure SLP visit  diagnosis: SLP Visit Diagnosis: Dysphagia, unspecified (R13.10) Past Medical History: Past Medical History: Diagnosis Date  Allergies   Anemia   Back pain   Bilateral swelling of feet   CHF (congestive heart failure) (HCC)   Chronic cough   COPD (chronic obstructive pulmonary disease) (HCC)   Depression   GERD (gastroesophageal reflux disease)   Heart murmur   Hypertension  Hypocapnia   Hypothyroid   Knee pain   Leg edema   chronic, bilateral  Lightheaded   Low back pain   Morbid obesity (HCC)   Neck pain   Obesity hypoventilation syndrome (HCC)   Oral lesion   Shortness of breath   Shortness of breath   Shoulder pain   Snoring  Past Surgical History: Past Surgical History: Procedure Laterality Date  APPENDECTOMY    CHOLECYSTECTOMY    COLONOSCOPY N/A 06/25/2013  Procedure: COLONOSCOPY;  Surgeon: Almeda Aris, MD;  Location: [facility] ENDOSCOPY;  Service: Endoscopy;  Laterality: N/A;  ECTOPIC PREGNANCY SURGERY    Left Ovary Removed    MENISCUS REPAIR    TONSILLECTOMY    TUBAL LIGATION   [name], MS [facility] SLP Acute Rehab Services Office [phone] [name] Comment 08/27/2023, 2:18 PM   PMH:   Past Medical History:  Diagnosis Date   Allergies    Anemia    Back pain    Bilateral swelling of feet    CHF (congestive heart failure) (HCC)    Chronic cough    COPD (chronic obstructive pulmonary disease) (HCC)    Depression    GERD (gastroesophageal reflux disease)    Heart murmur    Hypertension    Hypocapnia    Hypothyroid    Knee pain    Leg edema    chronic, bilateral   Lightheaded    Low back pain    Morbid obesity (HCC)    Neck pain    Obesity hypoventilation syndrome (HCC)    Oral lesion    Shortness of breath    Shortness of breath    Shoulder pain    Snoring     PSH:   Past Surgical History:  Procedure Laterality Date   APPENDECTOMY     CHOLECYSTECTOMY     COLONOSCOPY N/A 06/25/2013   Procedure: COLONOSCOPY;  Surgeon: Almeda Aris, MD;  Location: [facility]COPY;  Service:  Endoscopy;  Laterality: N/A;   ECTOPIC PREGNANCY SURGERY     Left Ovary Removed     MENISCUS REPAIR     TONSILLECTOMY     TUBAL LIGATION      Allergies:  Allergies  Allergen Reactions   Gabapentin     slept for 2 days   Novocain [Procaine] Other (See Comments)    intolerance    Medications:   Prior to Admission medications   Medication Sig Start Date End Date Taking? Authorizing Provider  albuterol  (PROVENTIL  HFA;VENTOLIN  HFA) 108 (90 BASE) MCG/ACT inhaler Inhale 2 puffs into the lungs every 6 (six) hours as needed for wheezing.    Yes [provider]  aspirin  EC 81 MG EC tablet Take 1 tablet (81 mg total) by mouth daily. 07/05/17  Yes Daren Eck, DO  baclofen  (LIORESAL ) 10 MG tablet Take 1 tablet by mouth 4 (four) times daily as needed for muscle spasms.    Yes [provider]  budesonide -formoterol  (SYMBICORT ) 160-4.5 MCG/ACT inhaler INHALE TWO puffs into lung TWICE DAILY 07/09/22  Yes Hunsucker, Archer Kobs, MD  Cholecalciferol (VITAMIN D -3) 125 MCG (5000 UT) TABS Take 1 tablet by mouth daily.   Yes [provider]  citalopram  (CELEXA ) 20 MG tablet Take 1 tablet (20 mg total) by mouth daily. 07/04/17  Yes Daren Eck, DO  cyclobenzaprine (FEXMID) 7.5 MG tablet Take 7.5 mg by mouth 3 (three) times daily. 07/15/23  Yes [provider]  diclofenac  sodium (VOLTAREN ) 1 % GEL Apply 1 application topically 4 (four) times  daily. Applied to knees, shoulders, wrists, and ankles   Yes [provider]  furosemide  (LASIX ) 40 MG tablet 2 tablets (80mg ) by mouth two times a day Patient taking differently: 2 tablets (80mg ) by mouth two times a day  Takes 2 tables in morning and 1 tab in evening 08/18/15  Yes Darlis Eisenmenger, MD  levothyroxine  (SYNTHROID ) 200 MCG tablet Take 1 tablet (200 mcg total) by mouth daily before breakfast. 03/24/19  Yes Jonn Nett, DO  metoprolol  tartrate (LOPRESSOR ) 25 MG tablet Take 0.5 tablets (12.5 mg total) by mouth 2  (two) times daily. Patient taking differently: Take 12.5 mg by mouth daily. 07/04/17  Yes Daren Eck, DO  naloxone  (NARCAN ) nasal spray 4 mg/0.1 mL Place 1 spray into the nose once. 07/15/23  Yes [provider]  nicotine  polacrilex (NICORETTE ) 4 MG gum Take 4 mg by mouth as needed for smoking cessation.   Yes [provider]  oxyCODONE -acetaminophen  (PERCOCET/ROXICET) 5-325 MG tablet Take 1 tablet by mouth every 6 (six) hours as needed for severe pain (pain score 7-10).   Yes [provider]  potassium chloride  SA (K-DUR,KLOR-CON ) 20 MEQ tablet 2 tablets (40 mEq) by mouth in the AM and 1 tablet (20 mEq) by mouth in the PM 10/09/16  Yes Darlis Eisenmenger, MD  pregabalin  (LYRICA ) 75 MG capsule Take 75 mg by mouth every 12 (twelve) hours. 07/15/23  Yes [provider]  propranolol  (INDERAL ) 10 MG tablet Take 10 mg by mouth 2 (two) times daily as needed. 07/04/23  Yes [provider]  RELISTOR 150 MG TABS Take 3 tablets by mouth daily. 03/21/23  Yes [provider]  tiotropium (SPIRIVA  HANDIHALER) 18 MCG inhalation capsule Place 1 capsule (18 mcg total) into inhaler and inhale daily. 07/09/22  Yes Hunsucker, Archer Kobs, MD  Spacer/Aero-Holding Chambers (AEROCHAMBER Z-STAT PLUS [place]) MISC Use as directed 05/10/15   Samual Crochet, MD    Discontinued Meds:   Medications Discontinued During This Encounter  Medication Reason   albuterol  (PROVENTIL ) (2.5 MG/3ML) 0.083% nebulizer solution 2.5 mg    ipratropium-albuterol  (DUONEB) 0.5-2.5 (3) MG/3ML nebulizer solution 3 mL    furosemide  (LASIX ) injection 80 mg    celecoxib (CELEBREX) 200 MG capsule Completed Course   fluconazole (DIFLUCAN) 150 MG tablet Completed Course   levothyroxine  (SYNTHROID ) 25 MCG tablet Dose change   pregabalin  (LYRICA ) 100 MG capsule Dose change   pregabalin  (LYRICA ) 50 MG capsule Dose change   sulfamethoxazole-trimethoprim (BACTRIM DS) 800-160 MG tablet Completed Course    Vitamin D , Ergocalciferol , (DRISDOL ) 1.25 MG (50000 UNIT) CAPS capsule Change in therapy   baclofen  (LIORESAL ) tablet 10 mg    pregabalin  (LYRICA ) capsule 75 mg    enoxaparin  (LOVENOX ) injection 85 mg    Chlorhexidine  Gluconate Cloth 2 % PADS 6 each    potassium chloride  SA (KLOR-CON  M) CR tablet 20 mEq    furosemide  (LASIX ) injection 40 mg    norepinephrine (LEVOPHED) 4mg  in 250mL (0.016 mg/mL) premix infusion    0.9 %  sodium chloride  infusion    furosemide  (LASIX ) injection 60 mg    metolazone  (ZAROXOLYN ) tablet 10 mg     Social History:  reports that she quit smoking about 8 years ago. Her smoking use included cigarettes. She started smoking about 53 years ago. She has a 90 pack-year smoking history. She has never used smokeless tobacco. She reports that she does not drink alcohol and does not use drugs.  Family History:   Family History  Problem Relation Age of Onset   Ovarian cancer Mother    Lung disease Mother    Heart failure Mother    Hypertension Mother    Heart disease Mother    Seizures Mother    Cancer Mother    Depression Mother    Sleep apnea Mother    Alcoholism Mother    Eating disorder Mother    Obesity Mother    Hypertension Father    Eating disorder Father    Obesity Father    Anxiety disorder Father    Lymphoma Brother    Ovarian cancer Maternal Aunt    Asthma Daughter     Blood pressure (!) 156/57, pulse 96, temperature 97.6 F (36.4 C), temperature source Oral, resp. rate (!) 25, height 5' 2 (1.575 m), weight 134.6 kg, SpO2 94%. Physical Exam: GEN: NAD, A&Ox3, NCAT, sitting in the chair bedside HEENT: No conjunctival pallor, EOMI NECK: Supple, no thyromegaly, could not appreciate JVD LUNGS: no rales at bases CV: RRR, No M/R/G ABD: Obese , SNDNT +BS  EXT: Lymphedema      Marcey Persad, Alveda Aures, MD 08/28/2023, 2:07 PM

## 2023-08-28 NOTE — Progress Notes (Signed)
 NAME:  Morgan Odonnell, MRN:  [id number], DOB:  [birth year], LOS: 13 ADMISSION DATE:  08/15/2023, CONSULTATION DATE:  08/20/2023 REFERRING MD:  Bobbetta Burnet - TRH, CHIEF COMPLAINT: Acute-on-chronic hypercapnic respiratory failure  History of Present Illness:  73 year old woman admitted to [facility] 6/6 with decreased responsiveness, AMS and hypersomnolence. PMHx significant for tobacco abuse, obesity, chronic hypoxic and hypercarbic resp failure, OHS/OSA, COPD GOLD C.  At baseline, patient lives with husband. Decreased activity ~ 1 yr. Sleeping in recliner/essentially living in recliner for several months. About a month PTA was last time she was able to walk with significant assistance from husband.  Baseline serum bicarb 38, BNP elevated 800, +congestion on CXR; VBG w/ pCO2 78, bicarb 51 and pH 7.42. Admitted w/ working dx of acute diastolic HF. Therapeutic interventions included: supplemental oxygen , aggressive diuresis, NIPPV.  Mental status improved, currently s/p > 10L diuresis (weight 342 to 314 lbs from time of admit to 6/11).   Patient was nearing discharge and PCCM asked to evaluate/assist with setting up home nocturnal ventilatory assistance.  Pertinent Medical History:  Obesity w/ associated restrictive lung disease, currently untreated OSA and OHS, prior smoker, chronic bronchitis, COPD FeV1 57% FeV1/FVC 70% baseline exertional dyspnea, chronic hypoxic & hypercarbic resp failure; HFpEF, hypothyroidism, chronic lymphedema, chronic pain, chronic orthopnea   Significant Hospital Events: Including procedures, antibiotic start and stop dates in addition to other pertinent events   6/6 - Admitted w/ acute on chronic diastolic HF, vol overload, hypoxic and hypercarbic resp failure  6/7 - Echo with LVEF 50 to 55%, low normal function; G1DD (impaired relaxation), elevated left atrial pressure, normal RV/RV function. 6/11 - PCCM consulted.  6/16 - Decreased responsiveness, brought back to SDU for closer  monitoring. WBC spiked to 34, placed on cefepime  and azithro.  Interim History / Subjective:  No significant events Refused BiPAP overnight MBS completed yesterday, no overt aspiration, more issues with oropharyngeal holding Nephro consulted by hospitalist team for hypoNa, suspect hypervolemic hyponatremia Fluid restriction + additional diuresis K replaced this AM  Objective:   Blood pressure (!) 142/55, pulse 81, temperature 97.7 F (36.5 C), temperature source Oral, resp. rate 19, height 5' 2 (1.575 m), weight 134.6 kg, SpO2 95%.        Intake/Output Summary (Last 24 hours) at 08/28/2023 0734 Last data filed at 08/28/2023 0630 Gross per 24 hour  Intake 1216.86 ml  Output 1080 ml  Net 136.86 ml   Filed Weights   08/25/23 1100 08/26/23 0500 08/28/23 0257  Weight: 132.3 kg 132.3 kg 134.6 kg   Physical Examination: General: Chronically ill-appearing older woman in NAD. More awake and conversant today, husband at bedside. HEENT: [place]/AT, anicteric sclera, PERRL, moist mucous membranes. +Hirsuitism. Neuro: Awake, oriented x 4. Responds to verbal stimuli. Following commands consistently. Moves all 4 extremities spontaneously. Generalized weakness. CV: RRR, no m/g/r. PULM: Breathing tachypneic, mildly labored on 6L [place]. Lung fields clear in upper fields, diminished at bases. GI: Obese, soft, nontender, nondistended. Normoactive bowel sounds. Extremities: Bilateral 3+ chronic-appearing woody LE edema noted. Skin: Warm/dry, woody edema/changes of chronic venous insufficiency noted to BLE.  Resolved Problem List:   Assessment and Plan:   Acute-on-chronic hypoxic and hypercapnic respiratory failure OHS/OSA noncompliance with CPAP Pulmonary edema 2/2 to decompensated HFpEF Suspected hypervolemic hyponatremia Possible HCAP Gold 3 COPD Restrictive lung disease from obesity - BiPAP QHS + with naps PRN, reiterated importance of compliance with this - Supplemental O2 support as  needed to maintain goal SpO2 - Wean FiO2 for sat >  90% - Triple therapy bronchodilators + DuoNebs PRN - Pulmonary hygiene (IS/flutter), encourage mobilization as able with PT/OT, lift assistance - Continue broad-spectrum antibiotics (cefepime Annamarie Barrier); low threshold to add vanc if clinically worsening (MRSA PCR+) but WBC improved, no fevers and stable O2 requirements at present - MBS without overt aspiration, continue DYS 3 diet, appreciate SLP recs - Diuresis per Nephrology recs, monitor I&Os, daily weights  Signature:   Star East, PA-C [facility] 08/28/23 7:34 AM  Please see [email or web address] for pager details.  From 7A-7P if no response, please call [phone] After hours, please call ELink [phone]

## 2023-08-28 NOTE — Progress Notes (Signed)
 Acute PT Note - Lymphedema Asked by OT to address order for lymphedema.  In reviewing chart, do not feel patient is appropriate for lymphedema wrapping at this time due to medical fragility.  Using lymphedema wraps would push increased fluid toward abdomen and it does not appear patient could handle that at this time.  I would suggest a lower intensity wrap, maybe ace wraps with absorbent dressing underneath (as it appears patient has some weeping edema) or TED hose.  I would recommend WOC RN consult to address wounds on lower extremity.   As patient stabilizes medically, we can determine if more intensive wrapping would be appropriate.  Thank you, 08/28/2023 Sammi Crick, PT Acute Rehabilitation Services Office:  [phone]

## 2023-08-28 NOTE — TOC Progression Note (Addendum)
 Transition of Care [facility]) - Progression Note    Patient Details  Name: Morgan Odonnell MRN: [id number] Date of Birth: 01/10/1951  [facility] CM/SW Contact  Tessie Fila, RN Phone Number: 08/28/2023, 1:38 PM  Clinical Narrative:    NCM sent referral to [facility], but pt not a candidate for LTAC.  Will follow up with pt/ spouse about SNF placement bed choice. TOC following.  Addendum:  Met with pt spouse at bedside, and he states he and his daughter have looked at the list and have not been able to make a decision on bed. Pt is not medically stable at this time. TOC to follow.  Expected Discharge Plan: Skilled Nursing Facility Barriers to Discharge: Continued Medical Work up  Expected Discharge Plan and Services In-house Referral: NA     Living arrangements for the past 2 months: Single Family Home                 DME Arranged: N/A DME Agency: NA       HH Arranged: NA HH Agency: NA         Social Determinants of Health (SDOH) Interventions SDOH Screenings   Food Insecurity: No Food Insecurity (08/16/2023)  Housing: Low Risk  (08/16/2023)  Transportation Needs: No Transportation Needs (08/16/2023)  Utilities: Not At Risk (08/16/2023)  Depression (PHQ2-9): Medium Risk (01/13/2019)  Social Connections: Moderately Integrated (08/16/2023)  Tobacco Use: Medium Risk (08/15/2023)    Readmission Risk Interventions    08/17/2023    9:31 AM  Readmission Risk Prevention Plan  Transportation Screening Complete  PCP or Specialist Appt within 5-7 Days Complete  Home Care Screening Complete  Medication Review (RN CM) Complete

## 2023-08-28 NOTE — Progress Notes (Signed)
  Progress Note   Patient: Morgan Odonnell:[id number] DOB: Apr 16, 1950 DOA: 08/15/2023     13 DOS: the patient was seen and examined on 08/28/2023   Brief hospital course: 23 YOF w/ hx of OHS/OSA, CHRF on 3 L O2, restrictive lung disease secondary to obesity, morbid obesity, HFpEF, hypothyroidism, mood disorder, chronic pain presented with concern for increased somnolence at home. At time of interview patient is awake alert and fully oriented.  She reports that her husband has been more concerned because over the past few days she has been sleeping more often.She reports that she has had worsening shortness of breath, chest pressure at rest, cough productive with white sputum over the past week.  No fever, chills.  She has chronic lymphedema thinks that her legs have become more swollen.  She has previously been intolerant of noninvasive ventilation with sleep and does not use at home.  Patient admitted for increased somnolence with acute on chronic hypoxic and hypercapnic respiratory failure.  Patient was transferred to the SDU due to worsening respiratory failure with hypercapnia and hypotension.  She was transiently placed on pressors, and was managed with the BiPAP.  Blood pressure has improved and she is on nasal cannula  Subjective: Patient seen at bedside this morning.  She was on 5 L of nasal cannula.  Alert and oriented x 3.  She did not consistently using her BiPAP at night to sleep.  Denies fevers, chills, shortness of breath.     Assessment and plan: Acute on chronic hypoxic and hypercapnic respiratory failure  Obesity hypoventilation Probable pulmonary hypertension secondary to chronic hypoxia Restrictive lung disease from obesity Noncompliance with CPAP/OSA Pulmonary edema secondary to decompensated HFpEF -She is currently on 5 L of nasal cannula with oxygen  saturations above 92. - Continue BiPAP support as she tolerates. - Continue antibiotics for HCAP.  Cefepime   azithromycin  -Improved leukocytosis, afebrile -Continue bronchodilators -Overall improved clinically   Hypokalemia Replace intravenously.   Persistent and worsening hyponatremia Likely multifactorial secondary to hypovolemia/total body water overload, ongoing naturesis/diuresis and possibly some level of SIADH from pulmonary abnormalities. Sodium improved to 121, appreciate nephrology input. -Started on urea    Chronic lymphedema OT evaluation to wrap bilateral lower extremities No evidence of infection   Chronic pain Patient remains on pregabalin  and as needed baclofen   Physical Exam: Vitals:   08/28/23 0600 08/28/23 0700 08/28/23 0800 08/28/23 0850  BP: (!) 142/55 (!) 138/46 116/74   Pulse: 81 80 85   Resp: 19 20 (!) 21   Temp:   97.6 F (36.4 C)   TempSrc:   Axillary   SpO2: 95% 98% 96% 93%  Weight:      Height:       Exam: General: obese chronically ill-appearing female sitting up in bed. CVS: S1-S2, regular  Respiratory: Diminished breath sounds bilaterally, on 5 L of oxygen  GI: NABS, soft, NT  LE: Marked lymphedema with hyperkeratosis and cracking of her skin    DVT prophylaxis: Lovenox  Code Status: DNR/DNI Family Communication: Husband at bedside Disposition: TBD    Time spent: 35 minutes  Author: Volney Grumbles, MD 08/28/2023 10:29 AM  For on call review [email or web address].

## 2023-08-28 NOTE — Progress Notes (Signed)
 [facility] ADULT ICU REPLACEMENT PROTOCOL   The patient does apply for the [facility] Adult ICU Electrolyte Replacment Protocol based on the criteria listed below:   1.Exclusion criteria: TCTS, ECMO, Dialysis, and Myasthenia Gravis patients 2. Is GFR >/= 30 ml/min? Yes.    Patient's GFR today is >60 3. Is SCr </= 2? Yes.   Patient's SCr is 0.49 mg/dL 4. Did SCr increase >/= 0.5 in 24 hours? No. 5.Pt's weight >40kg  Yes.   6. Abnormal electrolyte(s): K+ 3.3  7. Electrolytes replaced per protocol 8.  Call MD STAT for K+ </= 2.5, Phos </= 1, or Mag </= 1 Physician:  Renne Casa [facility] 08/28/2023 4:29 AM

## 2023-08-28 NOTE — Plan of Care (Signed)
  Problem: Clinical Measurements: Goal: Ability to maintain clinical measurements within normal limits will improve Outcome: Progressing   Problem: Clinical Measurements: Goal: Will remain free from infection Outcome: Progressing   Problem: Coping: Goal: Level of anxiety will decrease Outcome: Progressing   Problem: Pain Managment: Goal: General experience of comfort will improve and/or be controlled Outcome: Progressing

## 2023-08-29 DIAGNOSIS — E662 Morbid (severe) obesity with alveolar hypoventilation: Secondary | ICD-10-CM | POA: Diagnosis not present

## 2023-08-29 DIAGNOSIS — E871 Hypo-osmolality and hyponatremia: Secondary | ICD-10-CM | POA: Insufficient documentation

## 2023-08-29 DIAGNOSIS — Z515 Encounter for palliative care: Secondary | ICD-10-CM

## 2023-08-29 DIAGNOSIS — Z6841 Body Mass Index (BMI) 40.0 and over, adult: Secondary | ICD-10-CM

## 2023-08-29 DIAGNOSIS — G4733 Obstructive sleep apnea (adult) (pediatric): Secondary | ICD-10-CM | POA: Diagnosis not present

## 2023-08-29 DIAGNOSIS — Z7189 Other specified counseling: Secondary | ICD-10-CM

## 2023-08-29 DIAGNOSIS — J9621 Acute and chronic respiratory failure with hypoxia: Secondary | ICD-10-CM | POA: Diagnosis not present

## 2023-08-29 DIAGNOSIS — R1310 Dysphagia, unspecified: Secondary | ICD-10-CM

## 2023-08-29 DIAGNOSIS — I890 Lymphedema, not elsewhere classified: Secondary | ICD-10-CM

## 2023-08-29 DIAGNOSIS — I509 Heart failure, unspecified: Secondary | ICD-10-CM

## 2023-08-29 DIAGNOSIS — J418 Mixed simple and mucopurulent chronic bronchitis: Secondary | ICD-10-CM

## 2023-08-29 DIAGNOSIS — J9622 Acute and chronic respiratory failure with hypercapnia: Secondary | ICD-10-CM | POA: Diagnosis not present

## 2023-08-29 DIAGNOSIS — J984 Other disorders of lung: Secondary | ICD-10-CM

## 2023-08-29 DIAGNOSIS — R4589 Other symptoms and signs involving emotional state: Secondary | ICD-10-CM

## 2023-08-29 DIAGNOSIS — J449 Chronic obstructive pulmonary disease, unspecified: Secondary | ICD-10-CM | POA: Diagnosis not present

## 2023-08-29 LAB — CBC
HCT: 37.6 % (ref 36.0–46.0)
Hemoglobin: 11.9 g/dL — ABNORMAL LOW (ref 12.0–15.0)
MCH: 28.1 pg (ref 26.0–34.0)
MCHC: 31.6 g/dL (ref 30.0–36.0)
MCV: 88.7 fL (ref 80.0–100.0)
Platelets: 176 10*3/uL (ref 150–400)
RBC: 4.24 MIL/uL (ref 3.87–5.11)
RDW: 18.3 % — ABNORMAL HIGH (ref 11.5–15.5)
WBC: 9.3 10*3/uL (ref 4.0–10.5)
nRBC: 0 % (ref 0.0–0.2)

## 2023-08-29 LAB — BASIC METABOLIC PANEL WITH GFR
Anion gap: 10 (ref 5–15)
BUN: 23 mg/dL (ref 8–23)
CO2: 36 mmol/L — ABNORMAL HIGH (ref 22–32)
Calcium: 9.4 mg/dL (ref 8.9–10.3)
Chloride: 81 mmol/L — ABNORMAL LOW (ref 98–111)
Creatinine, Ser: 0.49 mg/dL (ref 0.44–1.00)
GFR, Estimated: >60 mL/min (ref >60)
Glucose, Bld: 83 mg/dL (ref 70–99)
Potassium: 2.9 mmol/L — ABNORMAL LOW (ref 3.5–5.1)
Sodium: 127 mmol/L — ABNORMAL LOW (ref 135–145)

## 2023-08-29 LAB — MAGNESIUM: Magnesium: 1.6 mg/dL — ABNORMAL LOW (ref 1.7–2.4)

## 2023-08-29 LAB — PHOSPHORUS: Phosphorus: 1.7 mg/dL — ABNORMAL LOW (ref 2.5–4.6)

## 2023-08-29 MED ORDER — MUPIROCIN 2 % EX OINT
1.0000 | TOPICAL_OINTMENT | Freq: Two times a day (BID) | CUTANEOUS | Status: DC
  Administered 2023-08-29 – 2023-09-01 (×6): 1 via NASAL
  Filled 2023-08-29 (×2): qty 22

## 2023-08-29 MED ORDER — MAGNESIUM SULFATE 2 GM/50ML IV SOLN
2.0000 g | Freq: Once | INTRAVENOUS | Status: AC
  Administered 2023-08-29: 2 g via INTRAVENOUS
  Filled 2023-08-29: qty 50

## 2023-08-29 MED ORDER — FUROSEMIDE 10 MG/ML IJ SOLN
40.0000 mg | Freq: Once | INTRAMUSCULAR | Status: AC
  Administered 2023-08-29: 40 mg via INTRAVENOUS
  Filled 2023-08-29: qty 4

## 2023-08-29 MED ORDER — POTASSIUM PHOSPHATES 15 MMOLE/5ML IV SOLN
30.0000 mmol | Freq: Once | INTRAVENOUS | Status: AC
  Administered 2023-08-29: 30 mmol via INTRAVENOUS
  Filled 2023-08-29: qty 10

## 2023-08-29 NOTE — Consult Note (Signed)
 WOC Nurse Consult Note: Reason for Consult: requested to assess bilateral legs edema. Wound type: Full thickness on left leg, anterior side of her left knee. No skin breakdown or leaking on the other leg. Edema 4+/4+. The pt does not use any type of compressive therapy, the lags are too swelling, and the compressive sock didn't fit anymore. Pressure Injury POA: NA Measurement: anterior left knee - 1.5 cm x 0.5 cm, and 0.5 cm x 0.5 cm.  Wound bed: 100% red. Partial healed. Drainage (amount, consistency, odor) Minimum, serous, no odor. Periwound: intact, partial macerate. Bilateral legs cover by scabs. Dressing procedure/placement/frequency: Bilateral legs: Cleanse the legs with CHG wash cloths. Moisture with Eurecin. Apply Petrolatum on the anterior side of left knee (open wound). Wrap with Kerlix and ACE wrap, since the bottom of her toes, until the knees, apply a light compress beginning on the ankle to bellow the knee.  WOC team will not plan to follow further.  Please reconsult if further assistance is needed. Thank-you,  Rachel Budds BSN, RN, [employer], WOC  (Pager: [phone])

## 2023-08-29 NOTE — Progress Notes (Signed)
 Morgan Odonnell is an 73 y.o. female with a history of HFpEF, COPD on home O2, morbid obesity, lymphedema, H CAP here with acute on chronic CHF exacerbation initially diuresed with Lasix  40 mg twice daily.  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  With a UrOsm 498 Lasix  may still help to decrease concentration gradient.             - started ureNa BID on 6/18             - daily weight continue             - Strict I/O -> very good UOP             - placed fluid restriction             - does not need 3% at present or [place]  - redose Lasix  today   - nothing else to add- will sign off.  Call with questions   2.  HCAP:             - cefepime / azithro   3.  Acute on chronic hypoxic RF:              - likely d/t HCAP + OHS +/- fluid   4.  Dispo: pending  Subjective: Feeling much better today   Chemistry and CBC: Creatinine, Ser  Date/Time Value Ref Range Status  08/29/2023 08:18 AM 0.49 0.44 - 1.00 mg/dL Final  30/86/5784 69:62 AM 0.49 0.44 - 1.00 mg/dL Final  95/28/4132 44:01 AM 0.52 0.44 - 1.00 mg/dL Final  02/72/5366 44:03 PM 0.61 0.44 - 1.00 mg/dL Final  47/42/5956 38:75 PM 0.64 0.44 - 1.00 mg/dL Final  64/33/2951 88:41 AM 0.72 0.44 - 1.00 mg/dL Final  66/08/3014 01:09 AM 1.13 (H) 0.44 - 1.00 mg/dL Final  32/35/5732 20:25 PM 1.16 (H) 0.44 - 1.00 mg/dL Final  42/70/6237 62:83 PM 0.93 0.44 - 1.00 mg/dL Final  15/17/6160 73:71 PM 0.88 0.44 - 1.00 mg/dL Final  09/04/9483 46:27 AM 0.73 0.44 - 1.00 mg/dL Final  03/50/0938 18:29 AM 0.62 0.44 - 1.00 mg/dL Final  93/71/6967 89:38 AM 0.67 0.44 - 1.00 mg/dL Final  12/25/5100 58:52 AM 0.66 0.44 - 1.00 mg/dL  Final  77/82/4235 36:14 AM 0.49 0.44 - 1.00 mg/dL Final  43/15/4008 67:61 PM 0.44 0.44 - 1.00 mg/dL Final  95/11/3265 12:45 AM 0.55 0.44 - 1.00 mg/dL Final  80/99/8338 25:05 AM 0.57 0.44 - 1.00 mg/dL Final  39/76/7341 93:79 PM 0.69 0.44 - 1.00 mg/dL Final  02/40/9735 32:99 PM 1.04 (H) 0.44 - 1.00 mg/dL Final  24/26/8341 96:22 PM 1.00 0.44 - 1.00 mg/dL Final  29/79/8921 19:41 PM 0.83 0.57 - 1.00 mg/dL Final  74/10/1446 18:56 PM 1.00 0.57 - 1.00 mg/dL Final  31/49/7026 37:85 PM 0.96 0.57 - 1.00 mg/dL Final  88/50/2774 12:87 AM 0.84 0.44 - 1.00 mg/dL Final  86/76/7209 47:09 AM 0.95 0.44 - 1.00 mg/dL Final  62/83/6629 47:65 AM 0.96 0.44 - 1.00 mg/dL Final  46/50/3546 56:81 AM 0.96 0.44 - 1.00 mg/dL  Final  06/30/2017 05:50 AM 1.09 (H) 0.44 - 1.00 mg/dL Final  16/12/9602 54:09 PM 1.12 (H) 0.44 - 1.00 mg/dL Final  81/19/1478 29:56 AM 1.18 (H) 0.44 - 1.00 mg/dL Final  21/30/8657 84:69 PM 1.48 (H) 0.44 - 1.00 mg/dL Final  62/95/2841 32:44 AM 1.67 (H) 0.44 - 1.00 mg/dL Final  03/13/7251 66:44 PM 2.21 (H) 0.44 - 1.00 mg/dL Final  03/47/4259 56:38 AM 2.59 (H) 0.44 - 1.00 mg/dL Final  75/64/3329 51:88 AM 2.19 (H) 0.44 - 1.00 mg/dL Final  41/66/0630 16:01 PM 2.09 (H) 0.44 - 1.00 mg/dL Final  09/32/3557 32:20 PM 0.93 0.44 - 1.00 mg/dL Final  25/42/7062 37:62 PM 0.93 0.44 - 1.00 mg/dL Final  83/15/1761 60:73 AM 1.12 (H) 0.44 - 1.00 mg/dL Final  71/08/2692 85:46 AM 1.20 (H) 0.44 - 1.00 mg/dL Final  27/05/5007 38:18 AM 0.81 0.44 - 1.00 mg/dL Final  29/93/7169 67:89 AM 0.81 0.44 - 1.00 mg/dL Final  38/12/1749 02:58 PM 1.0 0.4 - 1.2 mg/dL Final  52/77/8242 35:36 AM 0.87 0.50 - 1.10 mg/dL Final  14/43/1540 08:67 PM 1.3 (H) 0.4 - 1.2 mg/dL Final  61/95/0932 67:12 PM 1.1 0.4 - 1.2 mg/dL Final  45/80/9983 38:25 PM 0.9 0.4 - 1.2 mg/dL Final  05/39/7673 41:93 AM 0.63 0.4 - 1.2 mg/dL Final   Recent Labs  Lab 08/25/23 0345 08/26/23 0310 08/26/23 1339 08/26/23 2045 08/27/23 0311 08/28/23 0327  08/29/23 0818  NA 120* 122* 120* 120* 119* 121* 127*  K 3.2* 2.0* 2.7* 3.8 3.2* 3.3* 2.9*  CL 75* 77* 80* 80* 81* 82* 81*  CO2 33* 33* 30 30 29 30  36*  GLUCOSE 100* 80 119* 87 87 84 83  BUN 40* 33* 32* 28* 24* 19 23  CREATININE 1.13* 0.72 0.64 0.61 0.52 0.49 0.49  CALCIUM  8.9 8.8* 8.8* 9.2 9.2 9.1 9.4  PHOS  --   --   --   --   --   --  1.7*   Recent Labs  Lab 08/25/23 0345 08/26/23 0310 08/27/23 0311 08/28/23 0327 08/29/23 0818  WBC 34.7* 20.6* 12.8* 9.8 9.3  NEUTROABS 30.8*  --   --   --   --   HGB 11.6* 11.1* 11.8* 11.5* 11.9*  HCT 36.8 34.9* 38.9 38.3 37.6  MCV 89.5 87.7 90.3 92.1 88.7  PLT 183 162 157 145* 176   Liver Function Tests: Recent Labs  Lab 08/24/23 1307 08/26/23 0310  AST 44* 31  ALT 17 14  ALKPHOS 85 84  BILITOT 3.7* 2.5*  PROT 8.7* 7.7  ALBUMIN 2.5* 2.2*   No results for input(s): LIPASE, AMYLASE in the last 168 hours. No results for input(s): AMMONIA in the last 168 hours. Cardiac Enzymes: No results for input(s): CKTOTAL, CKMB, CKMBINDEX, TROPONINI in the last 168 hours. Iron Studies: No results for input(s): IRON, TIBC, TRANSFERRIN, FERRITIN in the last 72 hours. PT/INR: @LABRCNTIP (inr:5)  Xrays/Other Studies: ) Results for orders placed or performed during the hospital encounter of 08/15/23 (from the past 48 hours)  Osmolality     Status: Abnormal   Collection Time: 08/27/23  1:52 PM  Result Value Ref Range   Osmolality 264 (L) 275 - 295 mOsm/kg    Comment: REPEATED TO VERIFY Performed at [facility], [address] [place], [place] [address]   Na and K (sodium & potassium), rand urine     Status: None   Collection Time: 08/27/23  7:04 PM  Result Value Ref Range   Sodium, Ur <10  mmol/L   Potassium Urine 62 mmol/L    Comment: Performed at [facility], [address] [place], [place] [address]  Osmolality, urine     Status: None   Collection Time: 08/27/23  7:04 PM  Result Value Ref  Range   Osmolality, Ur 498 300 - 900 mOsm/kg    Comment: Performed at [facility], [address], [place], [place] [address]  Urinalysis, Routine w reflex microscopic -Urine, Clean Catch     Status: Abnormal   Collection Time: 08/27/23  7:33 PM  Result Value Ref Range   Color, Urine YELLOW YELLOW   APPearance CLEAR CLEAR   Specific Gravity, Urine 1.015 1.005 - 1.030   pH 6.0 5.0 - 8.0   Glucose, UA NEGATIVE NEGATIVE mg/dL   Hgb urine dipstick SMALL (A) NEGATIVE   Bilirubin Urine NEGATIVE NEGATIVE   Ketones, ur NEGATIVE NEGATIVE mg/dL   Protein, ur NEGATIVE NEGATIVE mg/dL   Nitrite NEGATIVE NEGATIVE   Leukocytes,Ua NEGATIVE NEGATIVE   RBC / HPF 6-10 0 - 5 RBC/hpf   WBC, UA 0-5 0 - 5 WBC/hpf   Bacteria, UA NONE SEEN NONE SEEN   Squamous Epithelial / HPF 0-5 0 - 5 /HPF   Mucus PRESENT    Hyaline Casts, UA PRESENT     Comment: Performed at [facility], [address] [place], [place] [address]  Sodium, urine, random     Status: None   Collection Time: 08/27/23  7:33 PM  Result Value Ref Range   Sodium, Ur <10 mmol/L    Comment: Performed at [facility], [address] [place], [place] [address]  CBC     Status: Abnormal   Collection Time: 08/28/23  3:27 AM  Result Value Ref Range   WBC 9.8 4.0 - 10.5 K/uL   RBC 4.16 3.87 - 5.11 MIL/uL   Hemoglobin 11.5 (L) 12.0 - 15.0 g/dL   HCT 57.8 46.9 - 62.9 %   MCV 92.1 80.0 - 100.0 fL   MCH 27.6 26.0 - 34.0 pg   MCHC 30.0 30.0 - 36.0 g/dL   RDW 52.8 (H) 41.3 - 24.4 %   Platelets 145 (L) 150 - 400 K/uL   nRBC 0.0 0.0 - 0.2 %    Comment: Performed at [facility], [address], [place], [place] [address]  Basic metabolic panel     Status: Abnormal   Collection Time: 08/28/23  3:27 AM  Result Value Ref Range   Sodium 121 (L) 135 - 145 mmol/L   Potassium 3.3 (L) 3.5 - 5.1 mmol/L   Chloride 82 (L) 98 - 111 mmol/L   CO2 30 22 - 32 mmol/L   Glucose, Bld 84 70 - 99  mg/dL    Comment: Glucose reference range applies only to samples taken after fasting for at least 8 hours.   BUN 19 8 - 23 mg/dL   Creatinine, Ser 2.53 0.44 - 1.00 mg/dL   Calcium  9.1 8.9 - 10.3 mg/dL   GFR, Estimated >66 >44 mL/min    Comment: (NOTE) Calculated using the CKD-EPI Creatinine Equation (2021)    Anion gap 9 5 - 15    Comment: Performed at [facility], [address], [place], [place] [address]  Basic metabolic panel with GFR     Status: Abnormal   Collection Time: 08/29/23  8:18 AM  Result Value Ref Range   Sodium 127 (L) 135 - 145 mmol/L  Potassium 2.9 (L) 3.5 - 5.1 mmol/L   Chloride 81 (L) 98 - 111 mmol/L   CO2 36 (H) 22 - 32 mmol/L   Glucose, Bld 83 70 - 99 mg/dL    Comment: Glucose reference range applies only to samples taken after fasting for at least 8 hours.   BUN 23 8 - 23 mg/dL   Creatinine, Ser 4.09 0.44 - 1.00 mg/dL   Calcium  9.4 8.9 - 10.3 mg/dL   GFR, Estimated >81 >19 mL/min    Comment: (NOTE) Calculated using the CKD-EPI Creatinine Equation (2021)    Anion gap 10 5 - 15    Comment: Performed at [facility], [address], [place], [place] [address]  Magnesium      Status: Abnormal   Collection Time: 08/29/23  8:18 AM  Result Value Ref Range   Magnesium  1.6 (L) 1.7 - 2.4 mg/dL    Comment: Performed at [facility], [address], [place], [place] [address]  Phosphorus     Status: Abnormal   Collection Time: 08/29/23  8:18 AM  Result Value Ref Range   Phosphorus 1.7 (L) 2.5 - 4.6 mg/dL    Comment: Performed at [facility], [address], [place], [place] [address]  CBC     Status: Abnormal   Collection Time: 08/29/23  8:18 AM  Result Value Ref Range   WBC 9.3 4.0 - 10.5 K/uL   RBC 4.24 3.87 - 5.11 MIL/uL   Hemoglobin 11.9 (L) 12.0 - 15.0 g/dL   HCT 78.4 69.6 - 29.5 %   MCV 88.7 80.0 - 100.0 fL   MCH 28.1 26.0 - 34.0 pg   MCHC 31.6 30.0 - 36.0 g/dL   RDW 28.4  (H) 13.2 - 15.5 %   Platelets 176 150 - 400 K/uL   nRBC 0.0 0.0 - 0.2 %    Comment: Performed at [facility], [address] [place], [place] [address]   No results found.   PMH:   Past Medical History:  Diagnosis Date   Allergies    Anemia    Back pain    Bilateral swelling of feet    CHF (congestive heart failure) (HCC)    Chronic cough    COPD (chronic obstructive pulmonary disease) (HCC)    Depression    GERD (gastroesophageal reflux disease)    Heart murmur    Hypertension    Hypocapnia    Hypothyroid    Knee pain    Leg edema    chronic, bilateral   Lightheaded    Low back pain    Morbid obesity (HCC)    Neck pain    Obesity hypoventilation syndrome (HCC)    Oral lesion    Shortness of breath    Shortness of breath    Shoulder pain    Snoring     PSH:   Past Surgical History:  Procedure Laterality Date   APPENDECTOMY     CHOLECYSTECTOMY     COLONOSCOPY N/A 06/25/2013   Procedure: COLONOSCOPY;  Surgeon: Almeda Aris, MD;  Location: [facility];  Service: Endoscopy;  Laterality: N/A;   ECTOPIC PREGNANCY SURGERY     Left Ovary Removed     MENISCUS REPAIR     TONSILLECTOMY     TUBAL LIGATION      Allergies:  Allergies  Allergen Reactions   Gabapentin     slept for 2 days   Novocain [Procaine] Other (See Comments)  intolerance    Medications:   Prior to Admission medications   Medication Sig Start Date End Date Taking? Authorizing Provider  albuterol  (PROVENTIL  HFA;VENTOLIN  HFA) 108 (90 BASE) MCG/ACT inhaler Inhale 2 puffs into the lungs every 6 (six) hours as needed for wheezing.    Yes [provider]  aspirin  EC 81 MG EC tablet Take 1 tablet (81 mg total) by mouth daily. 07/05/17  Yes Daren Eck, DO  baclofen  (LIORESAL ) 10 MG tablet Take 1 tablet by mouth 4 (four) times daily as needed for muscle spasms.    Yes [provider]  budesonide -formoterol  (SYMBICORT ) 160-4.5 MCG/ACT inhaler INHALE TWO  puffs into lung TWICE DAILY 07/09/22  Yes Hunsucker, Archer Kobs, MD  Cholecalciferol (VITAMIN D -3) 125 MCG (5000 UT) TABS Take 1 tablet by mouth daily.   Yes [provider]  citalopram  (CELEXA ) 20 MG tablet Take 1 tablet (20 mg total) by mouth daily. 07/04/17  Yes Daren Eck, DO  cyclobenzaprine (FEXMID) 7.5 MG tablet Take 7.5 mg by mouth 3 (three) times daily. 07/15/23  Yes [provider]  diclofenac  sodium (VOLTAREN ) 1 % GEL Apply 1 application topically 4 (four) times daily. Applied to knees, shoulders, wrists, and ankles   Yes [provider]  furosemide  (LASIX ) 40 MG tablet 2 tablets (80mg ) by mouth two times a day Patient taking differently: 2 tablets (80mg ) by mouth two times a day  Takes 2 tables in morning and 1 tab in evening 08/18/15  Yes Darlis Eisenmenger, MD  levothyroxine  (SYNTHROID ) 200 MCG tablet Take 1 tablet (200 mcg total) by mouth daily before breakfast. 03/24/19  Yes Jonn Nett, DO  metoprolol  tartrate (LOPRESSOR ) 25 MG tablet Take 0.5 tablets (12.5 mg total) by mouth 2 (two) times daily. Patient taking differently: Take 12.5 mg by mouth daily. 07/04/17  Yes Daren Eck, DO  naloxone  (NARCAN ) nasal spray 4 mg/0.1 mL Place 1 spray into the nose once. 07/15/23  Yes [provider]  nicotine  polacrilex (NICORETTE ) 4 MG gum Take 4 mg by mouth as needed for smoking cessation.   Yes [provider]  oxyCODONE -acetaminophen  (PERCOCET/ROXICET) 5-325 MG tablet Take 1 tablet by mouth every 6 (six) hours as needed for severe pain (pain score 7-10).   Yes [provider]  potassium chloride  SA (K-DUR,KLOR-CON ) 20 MEQ tablet 2 tablets (40 mEq) by mouth in the AM and 1 tablet (20 mEq) by mouth in the PM 10/09/16  Yes Darlis Eisenmenger, MD  pregabalin  (LYRICA ) 75 MG capsule Take 75 mg by mouth every 12 (twelve) hours. 07/15/23  Yes [provider]  propranolol  (INDERAL ) 10 MG tablet Take 10 mg by mouth 2 (two) times daily as needed.  07/04/23  Yes [provider]  RELISTOR 150 MG TABS Take 3 tablets by mouth daily. 03/21/23  Yes [provider]  tiotropium (SPIRIVA  HANDIHALER) 18 MCG inhalation capsule Place 1 capsule (18 mcg total) into inhaler and inhale daily. 07/09/22  Yes Hunsucker, Archer Kobs, MD  Spacer/Aero-Holding Chambers (AEROCHAMBER Z-STAT PLUS [place]) MISC Use as directed 05/10/15   Nestor, Jennings E, MD    Discontinued Meds:   Medications Discontinued During This Encounter  Medication Reason   albuterol  (PROVENTIL ) (2.5 MG/3ML) 0.083% nebulizer solution 2.5 mg    ipratropium-albuterol  (DUONEB) 0.5-2.5 (3) MG/3ML nebulizer solution 3 mL    furosemide  (LASIX ) injection 80 mg    celecoxib (CELEBREX) 200 MG capsule Completed Course   fluconazole (DIFLUCAN) 150 MG tablet Completed Course   levothyroxine  (SYNTHROID ) 25 MCG  tablet Dose change   pregabalin  (LYRICA ) 100 MG capsule Dose change   pregabalin  (LYRICA ) 50 MG capsule Dose change   sulfamethoxazole-trimethoprim (BACTRIM DS) 800-160 MG tablet Completed Course   Vitamin D , Ergocalciferol , (DRISDOL ) 1.25 MG (50000 UNIT) CAPS capsule Change in therapy   baclofen  (LIORESAL ) tablet 10 mg    pregabalin  (LYRICA ) capsule 75 mg    enoxaparin  (LOVENOX ) injection 85 mg    Chlorhexidine  Gluconate Cloth 2 % PADS 6 each    potassium chloride  SA (KLOR-CON  M) CR tablet 20 mEq    furosemide  (LASIX ) injection 40 mg    norepinephrine (LEVOPHED) 4mg  in (0.016 mg/mL) premix infusion    0.9 %  sodium chloride  infusion    furosemide  (LASIX ) injection 60 mg    metolazone  (ZAROXOLYN ) tablet 10 mg     Social History:  reports that she quit smoking about 8 years ago. Her smoking use included cigarettes. She started smoking about 53 years ago. She has a 90 pack-year smoking history. She has never used smokeless tobacco. She reports that she does not drink alcohol and does not use drugs.  Family History:   Family History  Problem Relation Age of Onset    Ovarian cancer Mother    Lung disease Mother    Heart failure Mother    Hypertension Mother    Heart disease Mother    Seizures Mother    Cancer Mother    Depression Mother    Sleep apnea Mother    Alcoholism Mother    Eating disorder Mother    Obesity Mother    Hypertension Father    Eating disorder Father    Obesity Father    Anxiety disorder Father    Lymphoma Brother    Ovarian cancer Maternal Aunt    Asthma Daughter     Blood pressure (!) 138/56, pulse 82, temperature 97.9 F (36.6 C), temperature source Oral, resp. rate 20, height 5' 2 (1.575 m), weight 135.5 kg, SpO2 92%. Physical Exam: GEN: NAD, A&Ox3, NCAT, sitting in the chair bedside HEENT: No conjunctival pallor, EOMI NECK: Supple, no thyromegaly, could not appreciate JVD LUNGS: no rales at bases CV: RRR, No M/R/G ABD: Obese , SNDNT +BS  EXT: Lymphedema      Leandra Pro, MD 08/29/2023, 1:37 PM

## 2023-08-29 NOTE — TOC Progression Note (Signed)
 Transition of Care [facility]) - Progression Note    Patient Details  Name: Morgan Odonnell MRN: [id number] Date of Birth: 05-30-1950  [facility]) CM/SW Contact  Tessie Fila, RN Phone Number: 08/29/2023, 3:48 PM  Clinical Narrative:    NCM spoke with Adolm Ahumada with [facility] related to [facility] consult for referral to Outpatient Palliative services. [name] agrees to accept referral and will follow-up with pt/family relating to services. TOC continuing to follow.   Expected Discharge Plan: Skilled Nursing Facility Barriers to Discharge: Continued Medical Work up  Expected Discharge Plan and Services In-house Referral: NA     Living arrangements for the past 2 months: Single Family Home                 DME Arranged: N/A DME Agency: NA       HH Arranged: NA HH Agency: NA         Social Determinants of Health (SDOH) Interventions SDOH Screenings   Food Insecurity: No Food Insecurity (08/16/2023)  Housing: Low Risk  (08/16/2023)  Transportation Needs: No Transportation Needs (08/16/2023)  Utilities: Not At Risk (08/16/2023)  Depression (PHQ2-9): Medium Risk (01/13/2019)  Social Connections: Moderately Integrated (08/16/2023)  Tobacco Use: Medium Risk (08/15/2023)    Readmission Risk Interventions    08/17/2023    9:31 AM  Readmission Risk Prevention Plan  Transportation Screening Complete  PCP or Specialist Appt within 5-7 Days Complete  Home Care Screening Complete  Medication Review (RN CM) Complete

## 2023-08-29 NOTE — Progress Notes (Signed)
 NAME:  Morgan Odonnell, MRN:  [id number], DOB:  02-Apr-1950, LOS: 14 ADMISSION DATE:  08/15/2023, CONSULTATION DATE:  08/20/2023 REFERRING MD:  Bobbetta Burnet - TRH, CHIEF COMPLAINT: Acute-on-chronic hypercapnic respiratory failure  History of Present Illness:  73 year old woman admitted to [facility] 6/6 with decreased responsiveness, AMS and hypersomnolence. PMHx significant for tobacco abuse, obesity, chronic hypoxic and hypercarbic resp failure, OHS/OSA, COPD GOLD C.  At baseline, patient lives with husband. Decreased activity ~ 1 yr. Sleeping in recliner/essentially living in recliner for several months. About a month PTA was last time she was able to walk with significant assistance from husband.  Baseline serum bicarb 38, BNP elevated 800, +congestion on CXR; VBG w/ pCO2 78, bicarb 51 and pH 7.42. Admitted w/ working dx of acute diastolic HF. Therapeutic interventions included: supplemental oxygen , aggressive diuresis, NIPPV.  Mental status improved, currently s/p > 10L diuresis (weight 342 to 314 lbs from time of admit to 6/11).   Patient was nearing discharge and PCCM asked to evaluate/assist with setting up home nocturnal ventilatory assistance.  Pertinent Medical History:  Obesity w/ associated restrictive lung disease, currently untreated OSA and OHS, prior smoker, chronic bronchitis, COPD FeV1 57% FeV1/FVC 70% baseline exertional dyspnea, chronic hypoxic & hypercarbic resp failure; HFpEF, hypothyroidism, chronic lymphedema, chronic pain, chronic orthopnea   Significant Hospital Events: Including procedures, antibiotic start and stop dates in addition to other pertinent events   6/6 - Admitted w/ acute on chronic diastolic HF, vol overload, hypoxic and hypercarbic resp failure  6/7 - Echo with LVEF 50 to 55%, low normal function; G1DD (impaired relaxation), elevated left atrial pressure, normal RV/RV function. 6/11 - PCCM consulted.  6/16 - Decreased responsiveness, brought back to SDU for closer  monitoring. WBC spiked to 34, placed on cefepime  and azithro.  Interim History / Subjective:  No significant events overnight Tolerated BiPAP well, no issues Up to chair yesterday with assistance Na improved to 127 (121) Mental status improved, not nearly as somnolent Down to 4LNC  Objective:   Blood pressure (!) 130/55, pulse 81, temperature 98.2 F (36.8 C), temperature source Axillary, resp. rate (!) 21, height 5' 2 (1.575 m), weight 135.5 kg, SpO2 100%.        Intake/Output Summary (Last 24 hours) at 08/29/2023 0715 Last data filed at 08/28/2023 2300 Gross per 24 hour  Intake 1262.38 ml  Output 2900 ml  Net -1637.62 ml   Filed Weights   08/26/23 0500 08/28/23 0257 08/29/23 0310  Weight: 132.3 kg 134.6 kg 135.5 kg   Physical Examination: General: Chronically ill-appearing older woman in NAD. Resting comfortably in bed. HEENT: [place]/AT, anicteric sclera, PERRL, moist mucous membranes. BiPAP mask in place. Neuro: Awake, oriented x 4. Responds to verbal stimuli. Following commands consistently. Moves all 4 extremities spontaneously. Generalized weakness. CV: RRR, no m/g/r. PULM: Breathing even and unlabored on BiPAP (taken off, placed on 4LNC now awake). Lung fields diminished at bases L > R. GI: Obese, soft, nontender, nondistended. Extremities: Bilateral chronic-appearing 3+ woody LE edema noted. Wraps/dressings in place. Skin: Warm/dry, BLE skin changes c/w chronic venous insufficiency.  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  From 7A-7P if no response, please call [phone] After hours, please call ELink [phone]

## 2023-08-29 NOTE — Consult Note (Signed)
 Consultation Note Date: 08/29/2023   Patient Name: Morgan Odonnell  DOB: 03-15-1950  MRN: [id number]  Age / Sex: 73 y.o., female   PCP: Bertha Broad, MD Referring Physician: Lonita Roach, MD  Reason for Consultation: Establishing goals of care     Chief Complaint/History of Present Illness:   Patient is a 74 year old female with a past medical history of COPD, restrictive lung disease, OSA, OHS, CHF, dysphagia, hypothyroidism, chronic lymphedema via, and chronic pain who was admitted on 08/15/2023 for management of somnolence and shortness of breath.  During hospitalization patient has received management for acute on chronic hypoxic and hypercapnic respiratory failure in the setting of OHS, COPD, and possible pneumonia.  Patient also having noncompliance with CPAP for management causing worsening status and transfer to stepdown unit.  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  Palliative medicine team consulted to assist with complex medical decision making.  Extensive review of EMR prior to presenting to bedside.  Reviewed recent documentation from nephrology, PCCM, and hospitalist.  Concerns patient probably has pulmonary hypertension secondary to chronic hypoxia.  Patient has completed appropriate antibiotics for management of concerns related to pneumonia.  At this time planning for patient to go to skilled nursing facility for rehab.  Patient will have to go to a facility that can provide BiPAP/NIV.  Patient's hyponatremia is slowly improving. Reviewed recent BMP noting sodium 127, potassium 2.9, phosphorus 1.7, and magnesium  1.6. Discussed care with bedside RN for medical updates.  Presented to bedside to meet with patient.  Patient lying comfortably in bed.  Patient's husband present at bedside.  Able to introduce myself as a member of the palliative medicine team my role in patient's medical journey.  Spent time learning about  patient's medical care at this point.  Discussed patient's underlying medical diagnoses and because of somnolence and shortness of breath at home.  Spent time again explaining the use of BiPAP and how CO2 retention affects patient's mentation.  Patient acknowledged this.  Husband noted patient was able to tolerate BiPAP all last night.  Patient planning to continue with use of BiPAP.  Encouraged used as discussed patient will have frequent rehospitalization's if not using BiPAP appropriately.  Spent time learning about patient's life outside of the hospital.  Patient believes the last time she was ambulatory was about a month ago.  She does feel that she wants to regain strength and is willing to go to rehab to do so.  Patient also notes she has lots of support from family at home.  Inquired about what brings patient joy at home and she noted that she likes being able to interact with her husband, daughter, and granddaughter.  She noted that her daughter and granddaughter live very close to them so she is able to visit with them frequently.  Patient is hopeful for more time at home to do so.  Patient is willing to go to rehab to regain strength so that she can have quality time at home.  Inquired about advance care planning for patient.  Husband notes that patient does not have any documentation.  Patient did state that if she was unable to make medical decisions for herself, she would want her husband to make medical decisions on her behalf.  Husband and patient did confirm that patient is DNR/DNI.  We also discussed importance of continuing conversations about patient's willingness to represent to the hospital.  Discussed that there may come a time when patient  feels that representing to the hospital as no longer providing her benefits and quality of life.  At that time would need to discuss maintaining quality of life and symptom management at home.  Encouraged further discussions continuing moving forward  for this.  Patient and husband acknowledged this.  Noted would place [facility] referral for outpatient palliative medicine team to assist with continuing discussions.  Spent time providing emotional support via active listening.  All questions answered at that time.  Noted palliative medicine to continue following patient's medical journey.  Discussed care with RN and hospitalist coordinate care.  Primary Diagnoses  Present on Admission:  Acute on chronic respiratory failure with hypoxia and hypercapnia (HCC)  (Resolved) Acute on chronic diastolic CHF (congestive heart failure) (HCC)  COPD (chronic obstructive pulmonary disease)Gold C  Obesity hypoventilation syndrome (HCC)  OSA (obstructive sleep apnea)   Past Medical History:  Diagnosis Date   Allergies    Anemia    Back pain    Bilateral swelling of feet    CHF (congestive heart failure) (HCC)    Chronic cough    COPD (chronic obstructive pulmonary disease) (HCC)    Depression    GERD (gastroesophageal reflux disease)    Heart murmur    Hypertension    Hypocapnia    Hypothyroid    Knee pain    Leg edema    chronic, bilateral   Lightheaded    Low back pain    Morbid obesity (HCC)    Neck pain    Obesity hypoventilation syndrome (HCC)    Oral lesion    Shortness of breath    Shortness of breath    Shoulder pain    Snoring    Social History   Socioeconomic History   Marital status: Married    Spouse name: Not on file   Number of children: Not on file   Years of education: Not on file   Highest education level: Not on file  Occupational History   Not on file  Tobacco Use   Smoking status: Former    Current packs/day: 0.00    Average packs/day: 2.0 packs/day for 45.0 years (90.0 ttl pk-yrs)    Types: Cigarettes    Start date: 04/11/1970    Quit date: 04/12/2015    Years since quitting: 8.3   Smokeless tobacco: Never   Tobacco comments:    still chews nicotine  gum  Substance and Sexual Activity   Alcohol use: No     Alcohol/week: 0.0 standard drinks of alcohol   Drug use: No   Sexual activity: Not on file  Other Topics Concern   Not on file  Social History Narrative   Was a bartender Research officer, political party Pulmonary:   Originally from [place]. [place]. Also worked as a Leisure centre manager for years. Currently has a dog and 6 cats. No bird exposure. Questionable mold exposure. No hot tub exposure. Currently uses a ventless gas heater.    Social Drivers of Corporate investment banker Strain: Not on file  Food Insecurity: No Food Insecurity (08/16/2023)   Hunger Vital Sign    Worried About Running Out of Food in the Last Year: Never true    Ran Out of Food in the Last Year: Never true  Transportation Needs: No Transportation Needs (08/16/2023)   PRAPARE - Administrator, Civil Service (Medical): No    Lack of Transportation (Non-Medical):  No  Physical Activity: Not on file  Stress: Not on file  Social Connections: Moderately Integrated (08/16/2023)   Social Connection and Isolation Panel    Frequency of Communication with Friends and Family: Twice a week    Frequency of Social Gatherings with Friends and Family: Three times a week    Attends Religious Services: 1 to 4 times per year    Active Member of Clubs or Organizations: No    Attends Banker Meetings: Never    Marital Status: Married   Family History  Problem Relation Age of Onset   Ovarian cancer Mother    Lung disease Mother    Heart failure Mother    Hypertension Mother    Heart disease Mother    Seizures Mother    Cancer Mother    Depression Mother    Sleep apnea Mother    Alcoholism Mother    Eating disorder Mother    Obesity Mother    Hypertension Father    Eating disorder Father    Obesity Father    Anxiety disorder Father    Lymphoma Brother    Ovarian cancer Maternal Aunt    Asthma Daughter    Scheduled Meds:  Chlorhexidine  Gluconate Cloth  6 each  Topical Daily   citalopram   20 mg Oral Daily   enoxaparin  (LOVENOX ) injection  60 mg Subcutaneous Q24H   feeding supplement  237 mL Oral BID BM   ferrous sulfate   325 mg Oral BID WC   fluticasone  furoate-vilanterol  1 puff Inhalation Daily   Gerhardt's butt cream   Topical BID   hydrocerin   Topical BID   levothyroxine   200 mcg Oral Q0600   nicotine   21 mg Transdermal Daily   mouth rinse  15 mL Mouth Rinse 4 times per day   potassium chloride   20 mEq Oral BID   pregabalin   25 mg Oral BID   sodium chloride  flush  3 mL Intravenous Q12H   umeclidinium bromide   1 puff Inhalation Daily   urea   15 g Oral BID   Continuous Infusions:  magnesium  sulfate bolus IVPB     potassium PHOSPHATE IVPB (in mmol)     PRN Meds:.acetaminophen , alum & mag hydroxide-simeth, baclofen , ipratropium-albuterol , melatonin, mouth rinse, oxyCODONE -acetaminophen , polyethylene glycol, propranolol  Allergies  Allergen Reactions   Gabapentin     slept for 2 days   Novocain [Procaine] Other (See Comments)    intolerance   CBC:    Component Value Date/Time   WBC 9.3 08/29/2023 0818   HGB 11.9 (L) 08/29/2023 0818   HGB 13.3 05/10/2019 1426   HCT 37.6 08/29/2023 0818   HCT 42.9 05/10/2019 1426   PLT 176 08/29/2023 0818   PLT 242 05/10/2019 1426   MCV 88.7 08/29/2023 0818   MCV 84 05/10/2019 1426   NEUTROABS 30.8 (H) 08/25/2023 0345   NEUTROABS 5.3 05/10/2019 1426   LYMPHSABS 1.7 08/25/2023 0345   LYMPHSABS 2.6 05/10/2019 1426   MONOABS 1.8 (H) 08/25/2023 0345   EOSABS 0.0 08/25/2023 0345   EOSABS 0.3 05/10/2019 1426   BASOSABS 0.1 08/25/2023 0345   BASOSABS 0.1 05/10/2019 1426   Comprehensive Metabolic Panel:    Component Value Date/Time   NA 127 (L) 08/29/2023 0818   NA 139 07/05/2019 1557   K 2.9 (L) 08/29/2023 0818   CL 81 (L) 08/29/2023 0818   CO2 36 (H) 08/29/2023 0818   BUN 23 08/29/2023 0818   BUN 22 07/05/2019 1557   CREATININE 0.49 08/29/2023  0818   GLUCOSE 83 08/29/2023 0818    CALCIUM  9.4 08/29/2023 0818   AST 31 08/26/2023 0310   ALT 14 08/26/2023 0310   ALKPHOS 84 08/26/2023 0310   BILITOT 2.5 (H) 08/26/2023 0310   BILITOT 0.7 07/05/2019 1557   PROT 7.7 08/26/2023 0310   PROT 7.0 07/05/2019 1557   ALBUMIN 2.2 (L) 08/26/2023 0310   ALBUMIN 3.8 07/05/2019 1557    Physical Exam: Vital Signs: BP (!) 138/56   Pulse 82   Temp 97.8 F (36.6 C) (Oral)   Resp 20   Ht 5' 2 (1.575 m)   Wt 135.5 kg   SpO2 92%   BMI 54.64 kg/m  SpO2: SpO2: 92 % O2 Device: O2 Device: Nasal Cannula O2 Flow Rate: O2 Flow Rate (L/min): 4 L/min Intake/output summary:  Intake/Output Summary (Last 24 hours) at 08/29/2023 1033 Last data filed at 08/29/2023 0930 Gross per 24 hour  Intake 1502.38 ml  Output 2900 ml  Net -1397.62 ml   LBM: Last BM Date : 08/28/23 Baseline Weight: Weight: 133.6 kg Most recent weight: Weight: 135.5 kg  General: NAD, awake, interactive, chronically ill-appearing Cardiovascular: RRR, no edema in LE b/l Respiratory: no increased work of breathing noted, not in respiratory distress, on O2 nasal cannula support Abdomen: not distended Extremities: Lymphedema in lower extremities bilaterally with wraps in place Neuro: Awake, interactive Psych: appropriately answers all questions          Palliative Performance Scale: 40%              Additional Data Reviewed: Recent Labs    08/28/23 0327 08/29/23 0818  WBC 9.8 9.3  HGB 11.5* 11.9*  PLT 145* 176  NA 121* 127*  BUN 19 23  CREATININE 0.49 0.49    Imaging: DG Swallowing Func-Speech Pathology Table formatting from the original result was not included. Modified Barium Swallow Study  Patient Details  Name: HARMONEE TOZER MRN: [id number] Date of Birth: 08-21-1950  Today's Date: 08/27/2023  HPI/PMH: HPI: 63 YOF w/ hx of OHS/OSA, CHRF on 3 L O2, restrictive lung disease  secondary to obesity, morbid obesity, HFpEF, hypothyroidism, mood  disorder, chronic pain presented with concern for  increased somnolence at  home. At time of interview patient is awake alert and fully oriented. She  reports that her husband has been more concerned because over the past few  days she has been sleeping more often.She reports that she has had  worsening shortness of breath, chest pressure at rest, cough productive  with white sputum over the past week. No fever, chills. She has chronic  lymphedema thinks that her legs have become more swollen. She has chronic  orthopnea. She has previously been intolerant of noninvasive ventilation  with sleep and does not use at home. Workup in the ED were notable for  labs showed BNP 821 bicarb 31 hemoglobin 9 previously 13 g, chest x-ray  with basilar atelectasis RT>LT. Blood gases pH 7.4 PCO278 pO2 less than 30  compensated picture Patient admitted for increased somnolence with acute  on chronic hypoxic and hypercapnic respiratory failure per MD note.  Clinical Impression: Clinical Impression: Patient presents with moderate oral and minimal  pharyngeal dysphagia without aspiration of any consistency tested. She  demonstrates significant oral control impairments with lingual pumping,  prolonged transiting with oral retention post-swallow without consistent  awareness.  Retention also adhered to hard palate - . Use of pudding  helpful to transit masticated solids into pharynx.   In addition, base  of  tongue retention noted without awareness.  Laryngeal penetration of thin  liquid occured secondary to poor oral control with premature spillage into  pharynx and decreased laryngeal closure - and this occured largely  consistently.  Chin tuck continued to allow penetration but appeared less  so and if pt finds this strategy helpful, it is advised she utlize this  with liquids. Barium tablet taken with pudding easily transited through  oropharynx and appeared to clear esophagus without delay.  No aspiration  observed and pt did cough x1 during  testing.  Factors that may increase risk of adverse event in presence of aspiration  [name] & [name] 2021): Factors that may increase risk of adverse event  in presence of aspiration [name] & [name] 2021): Frail or deconditioned;  Dependence for feeding and/or oral hygiene; Poor general health and/or  compromised immunity; Reduced cognitive function  Recommendations/Plan: Swallowing Evaluation Recommendations Swallowing Evaluation Recommendations Recommendations: PO diet PO Diet Recommendation: Dysphagia 3 (Mechanical soft); Thin liquids (Level  0) Liquid Administration via: Straw; Cup Medication Administration: Other (Comment) (as tolerated) Supervision: Staff to assist with self-feeding Swallowing strategies  : Slow rate; Small bites/sips; Chin tuck (use puree  to aid oral clearance of solid particles) Postural changes: Stay upright 30-60 min after meals; Out of bed for meals Oral care recommendations: Oral care QID (4x/day)  Treatment Plan Treatment Plan Treatment recommendations: Therapy as outlined in treatment plan below Functional status assessment: Patient has had a recent decline in their  functional status and demonstrates the ability to make significant  improvements in function in a reasonable and predictable amount of time. Treatment frequency: Min 1x/week Treatment duration: 2 weeks Interventions: Aspiration precaution training; Respiratory muscle strength  training; Compensatory techniques; Patient/family education; Diet  toleration management by SLP  Recommendations Recommendations for follow up therapy are one component of a  multi-disciplinary discharge planning process, led by the attending  physician.  Recommendations may be updated based on patient status,  additional functional criteria and insurance authorization.  Assessment: Orofacial Exam: Orofacial Exam Oral Cavity: Oral Hygiene: WFL Oral Cavity - Dentition: Missing dentition Orofacial  Anatomy: WFL Oral Motor/Sensory Function: Generalized oral weakness  Anatomy:  Anatomy: WFL  Boluses Administered: Boluses Administered Boluses Administered: Thin liquids (Level 0); Mildly thick liquids (Level  2, nectar thick); Puree; Solid     Oral Impairment Domain: Oral Impairment Domain Lip Closure: Escape from interlabial space or lateral juncture, no  extension beyond vermillion border Tongue control during bolus hold: Escape to lateral buccal cavity/floor of  mouth Bolus preparation/mastication: Slow prolonged chewing/mashing with  complete recollection Bolus transport/lingual motion: Repetitive/disorganized tongue motion Oral residue: Residue collection on oral structures Location of oral residue : Tongue Initiation of pharyngeal swallow : Pyriform sinuses; Posterior laryngeal  surface of the epiglottis     Pharyngeal Impairment Domain: Pharyngeal Impairment Domain Soft palate elevation: No bolus between soft palate (SP)/pharyngeal wall  (PW) Laryngeal elevation: Partial superior movement of thyroid   cartilage/partial approximation of arytenoids to epiglottic petiole Anterior hyoid excursion: Partial anterior movement Epiglottic movement: Complete inversion Laryngeal vestibule closure: Incomplete, narrow column air/contrast in  laryngeal vestibule Pharyngeal stripping wave : Present - diminished Pharyngeal contraction (A/P view only): N/A Pharyngoesophageal segment opening: Partial distention/partial duration,  partial obstruction of flow Tongue base retraction: Trace column of contrast or air between tongue  base and PPW Pharyngeal residue: Collection of residue within or on pharyngeal  structures Location of pharyngeal residue: Valleculae     Esophageal Impairment Domain: Esophageal Impairment Domain Esophageal  clearance upright position: Complete clearance, esophageal  coating  Pill: Pill Consistency administered: Puree  Penetration/Aspiration  Scale Score: Penetration/Aspiration Scale Score 1.  Material does not enter airway: Mildly thick liquids (Level 2, nectar  thick); Moderately thick liquids (Level 3, honey thick); Puree; Solid;  Pill 2.  Material enters airway, remains ABOVE vocal cords then ejected out:  Thin liquids (Level 0)  Compensatory Strategies: Compensatory Strategies Compensatory strategies: Yes Other(comment): Ineffective (count 1,2,3.. swallow; chin tuck, cue to  organize and swallow, reflexive dry swallows weak and ineffective)      General Information: Caregiver present: Yes   Diet Prior to this Study: Regular; Thin liquids (Level 0)    Temperature : Normal    Respiratory Status: WFL    Supplemental O2: Nasal cannula    History of Recent Intubation: No   Behavior/Cognition: Alert; Cooperative; Distractible  Self-Feeding Abilities: Dependent for feeding  Baseline vocal quality/speech: Hypophonia/low volume  Volitional Cough: Unable to elicit  Volitional Swallow: Unable to elicit (did not conduct consistently)  Exam Limitations: Limited visibility; Fatigue  Goal Planning: Prognosis for improved oropharyngeal function: Fair  Barriers to Reach Goals: Time post onset; Overall medical prognosis;  Cognitive deficits  No data recorded Patient/Family Stated Goal: none stated, spouse present  Consulted and agree with results and recommendations: Patient; Family  member/caregiver  Pain: Pain Assessment Pain Assessment: No/denies pain Faces Pain Scale: 4 Breathing: 0 Negative Vocalization: 0 Facial Expression: 0 Body Language: 0 Consolability: 0 PAINAD Score: 0 Pain Location: B LE with bed mobility Pain Descriptors / Indicators: Grimacing Pain Intervention(s): Limited activity within patient's tolerance;  Monitored during session  End of Session: Start Time:SLP Start Time (ACUTE ONLY): 1201  Stop Time: SLP Stop Time (ACUTE ONLY): 1231  Time Calculation:SLP Time Calculation  (min) (ACUTE ONLY): 30 min  Charges: SLP Evaluations $ SLP Speech Visit: 1 Visit  SLP Evaluations $BSS Swallow: 1 Procedure $MBS Swallow: 1 Procedure $Swallowing Treatment: 1 Procedure  SLP visit diagnosis: SLP Visit Diagnosis: Dysphagia, unspecified (R13.10)  Past Medical History:  Past Medical History:  Diagnosis Date   Allergies    Anemia    Back pain    Bilateral swelling of feet    CHF (congestive heart failure) (HCC)    Chronic cough    COPD (chronic obstructive pulmonary disease) (HCC)    Depression    GERD (gastroesophageal reflux disease)    Heart murmur    Hypertension    Hypocapnia    Hypothyroid    Knee pain    Leg edema    chronic, bilateral   Lightheaded    Low back pain    Morbid obesity (HCC)    Neck pain    Obesity hypoventilation syndrome (HCC)    Oral lesion    Shortness of breath    Shortness of breath    Shoulder pain    Snoring    Past Surgical History:  Past Surgical History:  Procedure Laterality Date   APPENDECTOMY     CHOLECYSTECTOMY     COLONOSCOPY N/A 06/25/2013   Procedure: COLONOSCOPY;  Surgeon: Almeda Aris, MD;  Location: [facility]  ENDOSCOPY;  Service: Endoscopy;  Laterality: N/A;   ECTOPIC PREGNANCY SURGERY     Left Ovary Removed     MENISCUS REPAIR     TONSILLECTOMY     TUBAL LIGATION     [name], MS [facility] SLP Acute Rehab Services Office [phone]  [name] Comment 08/27/2023, 2:18 PM    I personally reviewed recent imaging.  Palliative Care Assessment and Plan Summary of Established Goals of Care and Medical Treatment Preferences   Patient is a 73 year old female with a past medical history of COPD, restrictive lung disease, OSA, OHS, CHF, dysphagia, hypothyroidism, chronic lymphedema via, and chronic pain who was admitted on 08/15/2023 for management of somnolence and shortness of breath.  During hospitalization patient has received management for acute on chronic hypoxic and hypercapnic respiratory failure in the  setting of OHS, COPD, and possible pneumonia.  Patient also having noncompliance with CPAP for management causing worsening status and transfer to stepdown unit.  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  Palliative medicine team consulted to assist with complex medical decision making.  # Complex medical decision making/goals of care  - Discussed care with patient or husband at bed as detailed above in HPI.  Spent time discussing patient's medical illness and pathways for medical care moving forward.  Patient agreeing with continuing BiPAP for respiratory support.  Patient agreeing with going to rehab to attempt to regain strength.  Patient is hopeful to enjoy more quality time at home with her family as this is what brings her joy.  Discussed importance of continued open conversations regarding medical planning moving forward.  Encourage patient and husband to discussed that there may come a time when returning to the hospital does not benefit patient's quality of life and then would need to consider focusing on time at home with appropriate symptom management to allow time with family.  Palliative medicine team will continue following the patient's medical journey and engage in conversations as able and appropriate.  -  Code Status: Limited: Do not attempt resuscitation (DNR) -DNR-LIMITED -Do Not Intubate/DNI     - Patient and husband confirmed patient's CODE STATUS is DNR/DNI.  # Psycho-social/Spiritual Support:  - Support System: Husband, daughter, granddaughter  # Discharge Planning:  Skilled Nursing Facility for rehab with Palliative care service follow-up  Thank you for allowing the palliative care team to participate in the care JAZZLENE HUOT.  Barnett Libel, DO Palliative Care Provider PMT # [phone]  If patient remains symptomatic despite maximum doses, please call PMT at [phone] between 0700 and 1900. Outside of these hours,  please call attending, as PMT does not have night coverage.  Personally spent 75 minutes in patient care including extensive chart review (labs, imaging, progress/consult notes, vital signs), medically appropraite exam, discussed with treatment team, education to patient, family, and staff, documenting clinical information, medication review and management, coordination of care, and available advanced directive documents.

## 2023-08-29 NOTE — Progress Notes (Signed)
 Progress Note   Patient: Morgan Odonnell NWG:[id number] DOB: 01-Jun-1950 DOA: 08/15/2023     14 DOS: the patient was seen and examined on 08/29/2023   Brief hospital course: 73 year old woman complex PMH including COPD, restrictive lung disease, OSA, OHS, morbid obesity presented with somnolence, shortness of breath.  Admitted for acute on chronic hypoxic and hypercapnic respiratory failure, acute on chronic diastolic CHF.  Noncompliant with mask/BiPAP, eventually decompensated again and was transferred to stepdown unit and seen by pulmonology.  Did successfully tolerate the mask last night, plan to observe again tonight, if tolerates, can likely transfer out of unit tomorrow.  Eventually SNF when sodium and other electrolytes stable.  Consultants Pulmonology Nephrology   Procedures/Events 6/6 - Admitted w/ acute on chronic diastolic HF, vol overload, hypoxic and hypercarbic resp failure  6/7 - Echo with LVEF 50 to 55%, low normal function; G1DD (impaired relaxation), elevated left atrial pressure, normal RV/RV function. 6/11 - PCCM consulted.  6/16 - Decreased responsiveness, brought back to SDU for closer monitoring. WBC spiked to 34, placed on cefepime  and azithro.   Assessment and Plan: Acute on chronic hypoxic and hypercapnic respiratory failure  Obesity hypoventilation Probable pulmonary hypertension secondary to chronic hypoxia Restrictive lung disease from obesity Noncompliance with CPAP/OSA GOLD 3 COPD Possible pneumonia Baseline oxygen  supplementation 3-4 L nasal cannula Currently at baseline.   Tolerated NIV last night. Completed antibiotics yesterday Hospitalizations been complicated by noncompliance with NIV, now she endorses that she will comply Risk for rehospitalization is high, especially given noncompliance.  Will consult PMT to assist with long-term goals of care. [employer] can set up patient for home BiPAP/NIV, but not for SNF so the order will have to be completed on  the day of discharge so that it is valid for 30 days which will allow set up when the patient returns home. [facility] will not be able to provide a BiPAP/NIV in a SNF, so patient will need to be accepted to a SNF that can provide BiPAP/NIV.   Acute on chronic diastolic CHF  Down 18L.  Follow clinically. 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  Repeat in AM.  Dysphagia Diet per SLP MBS without overt aspiration, continue DYS 3 diet, appreciate SLP recs  Normocytic anemia Mild  Hypothyroidism TSH was high on admission, this can be followed up in the outpatient setting, consider sick euthyroid.  Repeat testing in the outpatient setting.  Chronic lymphedema Longstanding since teenager, last seen in wound care center 12/2022, see wound care note 6/7. Recommend outpatient follow-up with wound care center or lymphedema clinic   Chronic pain Patient remains on pregabalin  and as needed baclofen   Morbid obesity Body mass index is 54.64 kg/m.  Overall seems to be improving.  If tolerates NIV again tonight she can transfer out of the unit.  Replete electrolytes, if continues to improve, anticipate stability for transfer to SNF by 6/23    Subjective:  Feels okay.  Tolerated mask last night.  Poor appetite.  Physical Exam: Vitals:   08/29/23 0600 08/29/23 0700 08/29/23 0800 08/29/23 0836  BP: (!) 130/55 (!) 138/54 (!) 138/56   Pulse: 81 78 82   Resp: (!) 21 19 20    Temp:   97.8 F (36.6 C)   TempSrc:   Oral  SpO2: 100% 96% 97% 92%  Weight:      Height:       Physical Exam Vitals reviewed.  Constitutional:      General: She is not in acute distress.    Appearance: She is not ill-appearing or toxic-appearing.    Cardiovascular:     Rate and Rhythm: Normal rate and regular rhythm.     Heart sounds: No murmur heard. Pulmonary:     Effort: Pulmonary effort is normal. No respiratory distress.     Breath sounds: No wheezing, rhonchi or rales.   Musculoskeletal:     Right lower leg: Edema present.     Left lower leg: Edema present.     Comments: Chronic skin changes noted   Neurological:     Mental Status: She is alert.   Psychiatric:        Mood and Affect: Mood normal.        Behavior: Behavior normal.     Data Reviewed: Sodium 127 potassium 2.9, phosphorus 1.7, magnesium  1.6 CBC noted  Family Communication: Husband at bedside  Disposition: Status is: Inpatient Remains inpatient appropriate because: Respiratory failure, electrolyte abnormalities     Time spent: 45 minutes  Author: Jerline Moon, MD 08/29/2023 10:31 AM  For on call review [email or web address].

## 2023-08-29 NOTE — Progress Notes (Signed)
 Physical Therapy Treatment Patient Details Name: Morgan Odonnell MRN: [id number] DOB: [birth year] Today's Date: 08/29/2023   History of Present Illness 73 year old female admitted on 08/15/23 for increased somnolence with acute on chronic hypoxic and hypercapnic respiratory failure. PMHx: OHS/OSA, CHRF on 3 L O2, restrictive lung disease secondary to obesity, morbid obesity, HFpEF, hypothyroidism, mood disorder, chronic pain, lymphedema, CHF, hypoventilation syndrome, heart murmur    PT Comments   Pt admitted with above diagnosis.  Pt currently with functional limitations due to the deficits listed below (see PT Problem List). Pt in bed when PT arrived. Nurse tech present and providing assist with ADLs. Pt motivated to get OOB. Pt required mod A x 2 for rolling tasks side to side for hygiene and placement of lift pad. Pt required skymax lift for safe transfer from bed to recliner, pt positioned in recliner with B LE elevated and all needs in place. Pt on 4 L/min and desaturated with bed mobility briefly to 89%.  Patient will benefit from continued inpatient follow up therapy, <3 hours/day. Pt will benefit from acute skilled PT to increase their independence and safety with mobility to allow discharge.      If plan is discharge home, recommend the following: Two people to help with walking and/or transfers;Assist for transportation;Supervision due to cognitive status;Two people to help with bathing/dressing/bathroom   Can travel by private vehicle     No  Equipment Recommendations  None recommended by PT    Recommendations for Other Services       Precautions / Restrictions Precautions Precautions: Fall Precaution/Restrictions Comments: Fragile skin, On HFNC Restrictions Weight Bearing Restrictions Per Provider Order: No     Mobility  Bed Mobility Overal bed mobility: Needs Assistance Bed Mobility: Rolling Rolling: +2 for physical assistance, +2 for safety/equipment, Used rails, Mod  assist              Transfers                     Transfer via Lift Equipment: [place]  Ambulation/Gait                   Stairs             Wheelchair Mobility     Tilt Bed    Modified Rankin (Stroke Patients Only)       Balance Overall balance assessment: Needs assistance                                          Communication Communication Communication: No apparent difficulties  Cognition Arousal: Alert Behavior During Therapy: WFL for tasks assessed/performed   PT - Cognitive impairments: Problem solving, Sequencing                       PT - Cognition Comments: difficult to understand speech Following commands: Impaired Following commands impaired: Follows one step commands with increased time    Cueing Cueing Techniques: Verbal cues, Gestural cues, Tactile cues, Visual cues  Exercises      General Comments General comments (skin integrity, edema, etc.): 4 L/min and pt desaturated to 89% with bed mobility tasks, B ace wraps on distal LEs, pt appears to be more alert and communicative today      Pertinent Vitals/Pain Pain Assessment Pain Assessment: No/denies pain Breathing: normal Negative Vocalization: none Facial Expression: smiling  or inexpressive Body Language: relaxed Consolability: no need to console PAINAD Score: 0    Home Living                          Prior Function            PT Goals (current goals can now be found in the care plan section) Acute Rehab PT Goals PT Goal Formulation: With patient/family Time For Goal Achievement: 08/31/23 Potential to Achieve Goals: Good Progress towards PT goals: Not progressing toward goals - comment    Frequency    Min 2X/week      PT Plan      Co-evaluation              AM-PAC PT 6 Clicks Mobility   Outcome Measure  Help needed turning from your back to your side while in a flat bed without using bedrails?:  A Lot Help needed moving from lying on your back to sitting on the side of a flat bed without using bedrails?: Total Help needed moving to and from a bed to a chair (including a wheelchair)?: Total Help needed standing up from a chair using your arms (e.g., wheelchair or bedside chair)?: Total Help needed to walk in hospital room?: Total Help needed climbing 3-5 steps with a railing? : Total 6 Click Score: 7    End of Session   Activity Tolerance: Treatment limited secondary to medical complications (Comment);Patient limited by fatigue (SOB) Patient left: in bed;with nursing/sitter in room Nurse Communication: Mobility status;Need for lift equipment PT Visit Diagnosis: Muscle weakness (generalized) (M62.81);Difficulty in walking, not elsewhere classified (R26.2)     Time: 1610-9604 PT Time Calculation (min) (ACUTE ONLY): 13 min  Charges:    $Therapeutic Activity: 8-22 mins PT General Charges $$ ACUTE PT VISIT: 1 Visit                     Morgan Odonnell, PT Acute Rehab    Morgan Odonnell 08/29/2023, 3:14 PM

## 2023-08-29 NOTE — Plan of Care (Signed)

## 2023-08-29 NOTE — Progress Notes (Signed)
 [id number] Liaison Note  Received request for outpatient palliative services from [place], [place] at discharge.  Dellar Fenton requested we reach out to spouse, to discuss services.  Phoned Mr.David Gailey and introduced self and [facility] and reason for my call. Mr. Strub asked if we could please reach out tomorrow for further discussion.  Informed him that weekend liaison would attempt to connect with him tomorrow to explain outpatient palliative services further.  Please call with any questions or concerns.  Thank you for the opportunity to participate in this patients care.  Lestine Rathke, [facility] Liaison  [phone]

## 2023-08-30 DIAGNOSIS — G4733 Obstructive sleep apnea (adult) (pediatric): Secondary | ICD-10-CM | POA: Diagnosis not present

## 2023-08-30 DIAGNOSIS — E871 Hypo-osmolality and hyponatremia: Secondary | ICD-10-CM | POA: Diagnosis not present

## 2023-08-30 DIAGNOSIS — E662 Morbid (severe) obesity with alveolar hypoventilation: Secondary | ICD-10-CM | POA: Diagnosis not present

## 2023-08-30 DIAGNOSIS — J984 Other disorders of lung: Secondary | ICD-10-CM | POA: Diagnosis not present

## 2023-08-30 DIAGNOSIS — J9622 Acute and chronic respiratory failure with hypercapnia: Secondary | ICD-10-CM | POA: Diagnosis not present

## 2023-08-30 DIAGNOSIS — J9621 Acute and chronic respiratory failure with hypoxia: Secondary | ICD-10-CM | POA: Diagnosis not present

## 2023-08-30 DIAGNOSIS — J449 Chronic obstructive pulmonary disease, unspecified: Secondary | ICD-10-CM | POA: Diagnosis not present

## 2023-08-30 DIAGNOSIS — Z515 Encounter for palliative care: Secondary | ICD-10-CM | POA: Diagnosis not present

## 2023-08-30 DIAGNOSIS — J418 Mixed simple and mucopurulent chronic bronchitis: Secondary | ICD-10-CM | POA: Diagnosis not present

## 2023-08-30 LAB — BASIC METABOLIC PANEL WITH GFR
Anion gap: 11 (ref 5–15)
Anion gap: 9 (ref 5–15)
BUN: 36 mg/dL — ABNORMAL HIGH (ref 8–23)
BUN: 37 mg/dL — ABNORMAL HIGH (ref 8–23)
CO2: 36 mmol/L — ABNORMAL HIGH (ref 22–32)
CO2: 35 mmol/L — ABNORMAL HIGH (ref 22–32)
Calcium: 9.4 mg/dL (ref 8.9–10.3)
Calcium: 9.2 mg/dL (ref 8.9–10.3)
Chloride: 79 mmol/L — ABNORMAL LOW (ref 98–111)
Chloride: 83 mmol/L — ABNORMAL LOW (ref 98–111)
Creatinine, Ser: 0.47 mg/dL (ref 0.44–1.00)
Creatinine, Ser: 0.5 mg/dL (ref 0.44–1.00)
GFR, Estimated: >60 mL/min (ref >60)
GFR, Estimated: >60 mL/min (ref >60)
Glucose, Bld: 87 mg/dL (ref 70–99)
Glucose, Bld: 121 mg/dL — ABNORMAL HIGH (ref 70–99)
Potassium: 2.9 mmol/L — ABNORMAL LOW (ref 3.5–5.1)
Potassium: 3.5 mmol/L (ref 3.5–5.1)
Sodium: 126 mmol/L — ABNORMAL LOW (ref 135–145)
Sodium: 127 mmol/L — ABNORMAL LOW (ref 135–145)

## 2023-08-30 LAB — CBC
HCT: 37.6 % (ref 36.0–46.0)
Hemoglobin: 11.5 g/dL — ABNORMAL LOW (ref 12.0–15.0)
MCH: 27.1 pg (ref 26.0–34.0)
MCHC: 30.6 g/dL (ref 30.0–36.0)
MCV: 88.7 fL (ref 80.0–100.0)
Platelets: 174 10*3/uL (ref 150–400)
RBC: 4.24 MIL/uL (ref 3.87–5.11)
RDW: 18.5 % — ABNORMAL HIGH (ref 11.5–15.5)
WBC: 8.1 10*3/uL (ref 4.0–10.5)
nRBC: 0 % (ref 0.0–0.2)

## 2023-08-30 LAB — PHOSPHORUS: Phosphorus: 2.4 mg/dL — ABNORMAL LOW (ref 2.5–4.6)

## 2023-08-30 LAB — MAGNESIUM: Magnesium: 1.8 mg/dL (ref 1.7–2.4)

## 2023-08-30 MED ORDER — POTASSIUM CHLORIDE 20 MEQ PO PACK
60.0000 meq | PACK | Freq: Once | ORAL | Status: AC
  Administered 2023-08-30: 60 meq via ORAL
  Filled 2023-08-30: qty 3

## 2023-08-30 MED ORDER — POTASSIUM CHLORIDE 20 MEQ PO PACK
20.0000 meq | PACK | Freq: Two times a day (BID) | ORAL | Status: DC
  Administered 2023-08-31 – 2023-09-01 (×3): 20 meq via ORAL
  Filled 2023-08-30 (×3): qty 1

## 2023-08-30 MED ORDER — POTASSIUM CHLORIDE CRYS ER 20 MEQ PO TBCR
40.0000 meq | EXTENDED_RELEASE_TABLET | Freq: Once | ORAL | Status: AC
  Administered 2023-08-30: 40 meq via ORAL
  Filled 2023-08-30: qty 2

## 2023-08-30 MED ORDER — SODIUM PHOSPHATES 45 MMOLE/15ML IV SOLN
15.0000 mmol | Freq: Once | INTRAVENOUS | Status: AC
  Administered 2023-08-30: 15 mmol via INTRAVENOUS
  Filled 2023-08-30: qty 5

## 2023-08-30 MED ORDER — POTASSIUM CHLORIDE CRYS ER 20 MEQ PO TBCR: 40.0000 meq | EXTENDED_RELEASE_TABLET | Freq: Once | ORAL | Status: DC

## 2023-08-30 MED ORDER — MAGNESIUM SULFATE 2 GM/50ML IV SOLN
2.0000 g | Freq: Once | INTRAVENOUS | Status: AC
  Administered 2023-08-30: 2 g via INTRAVENOUS
  Filled 2023-08-30: qty 50

## 2023-08-30 NOTE — Progress Notes (Signed)
 Daily Progress Note   Patient Name: Morgan Odonnell       Date: 08/30/2023 DOB: 04-20-1950  Age: 73 y.o. MRN#: [id number] Attending Physician: Jadine Toribio SQUIBB, MD Primary Care Physician: Yolande Toribio MATSU, MD Admit Date: 08/15/2023 Length of Stay: 15 days  Reason for Consultation/Follow-up: Establishing goals of care  Subjective:   Reviewed recent EMR documentation.  Patient medically improving at this time so planning to transfer to floor room today.  Hopeful patient can transfer to SNF for rehab on 09/01/2023.  Recent PCCM documentation reviewed.  Patient continuing compliance with BiPAP overnight.  Planning for continuing BiPAP upon discharge.  Nephrology has signed off due to improvement of hypokalemia.  Reviewed recent note from [facility] palliative medicine who has been consulted to initiate outpatient palliative follow-up. Discussed care with RN for medical updates.   Patient feeling better at this time with husband at bedside.  Patient was able to have care and face cleaned so feeling better at this time.  Continuing appropriate medical interventions.  Objective:   Vital Signs:  BP (!) (P) 122/43 (BP Location: Right Arm)   Pulse 94   Temp 98.1 F (36.7 C) (Oral)   Resp (!) 23   Ht 5' 2 (1.575 m)   Wt 135.4 kg   SpO2 91%   BMI 54.60 kg/m   Physical Exam: General: NAD, awake, chronically ill-appearing Cardiovascular: RRR, edema in LE b/l Respiratory: no increased work of breathing noted, not in respiratory distress, on O2 nasal cannula Extremities: Lymphedema in lower extremities bilaterally with wraps in place   Assessment & Plan:   Assessment: Patient is a 72 year old female with a past medical history of COPD, restrictive lung disease, OSA, OHS, CHF, dysphagia, hypothyroidism, chronic lymphedema via, and chronic pain who was admitted on 08/15/2023 for management of somnolence and shortness of breath.  During hospitalization patient has received management for acute on  chronic hypoxic and hypercapnic respiratory failure in the setting of OHS, COPD, and possible pneumonia.  Patient also having noncompliance with CPAP for management causing worsening status and transfer to stepdown unit.  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  Palliative medicine team consulted to assist with complex medical decision making.   Recommendations/Plan: # Complex medical decision making/goals of care:    -Patient has agreed that she would like to continue with BiPAP for respiratory support.  Patient agreed to go to rehab to attempt to regain strength.  Patient is hopeful to enjoy more quality time at home with her family as this is what brings her joy.  Have noted importance of continued open conversations regarding medical planning moving forward.  Encouraged patient and husband to discuss that there may come a time when returning to the hospital does not benefit patient's quality of life and then would need to consider focusing on time at home with appropriate symptom management to allow time with family.  Palliative medicine team will continue following the patient's medical journey and engage in conversations as able and appropriate.                -  Code Status: Limited: Do not attempt resuscitation (DNR) -DNR-LIMITED -Do Not Intubate/DNI     # Psycho-social/Spiritual Support:  - Support System: Husband, daughter, granddaughter   # Discharge Planning:  Skilled Nursing Facility for rehab with Palliative care service follow-up - TOC has assisted with referral to [facility] outpatient palliative medicine for follow-up.  Thank you for allowing the palliative care team  to participate in the care [name].  Tinnie Radar, DO Palliative Care Provider PMT # [phone]  If patient remains symptomatic despite maximum doses, please call PMT at [phone] between 0700 and 1900. Outside of these hours, please call attending, as PMT does  not have night coverage.

## 2023-08-30 NOTE — Progress Notes (Signed)
 NAME:  Morgan PISARSKI, MRN:  [id number], DOB:  12-May-1950, LOS: 15 ADMISSION DATE:  08/15/2023, CONSULTATION DATE:  08/20/2023 REFERRING MD:  CHRISTOBAL - TRH, CHIEF COMPLAINT: Acute-on-chronic hypercapnic respiratory failure  History of Present Illness:  73 year old woman admitted to [facility] 6/6 with decreased responsiveness, AMS and hypersomnolence. PMHx significant for tobacco abuse, obesity, chronic hypoxic and hypercarbic resp failure, OHS/OSA, COPD GOLD C.  At baseline, patient lives with husband. Decreased activity ~ 1 yr. Sleeping in recliner/essentially living in recliner for several months. About a month PTA was last time she was able to walk with significant assistance from husband.  Baseline serum bicarb 38, BNP elevated 800, +congestion on CXR; VBG w/ pCO2 78, bicarb 51 and pH 7.42. Admitted w/ working dx of acute diastolic HF. Therapeutic interventions included: supplemental oxygen , aggressive diuresis, NIPPV.  Mental status improved, currently s/p > 10L diuresis (weight 342 to 314 lbs from time of admit to 6/11).   Patient was nearing discharge and PCCM asked to evaluate/assist with setting up home nocturnal ventilatory assistance.  Pertinent Medical History:  Obesity w/ associated restrictive lung disease, currently untreated OSA and OHS, prior smoker, chronic bronchitis, COPD FeV1 57% FeV1/FVC 70% baseline exertional dyspnea, chronic hypoxic & hypercarbic resp failure; HFpEF, hypothyroidism, chronic lymphedema, chronic pain, chronic orthopnea   Significant Hospital Events: Including procedures, antibiotic start and stop dates in addition to other pertinent events   6/6 - Admitted w/ acute on chronic diastolic HF, vol overload, hypoxic and hypercarbic resp failure  6/7 - Echo with LVEF 50 to 55%, low normal function; G1DD (impaired relaxation), elevated left atrial pressure, normal RV/RV function. 6/11 - PCCM consulted.  6/16 - Decreased responsiveness, brought back to SDU for closer  monitoring. WBC spiked to 34, placed on cefepime  and azithro.  Interim History / Subjective:   Used BiPAP 6 hours overnight Complains of thirst Down to 4 L nasal cannula Diuresed 2 L  Objective:   Blood pressure 106/63, pulse 94, temperature 97.7 F (36.5 C), temperature source Oral, resp. rate (!) 23, height 5' 2 (1.575 m), weight 135.4 kg, SpO2 91%.        Intake/Output Summary (Last 24 hours) at 08/30/2023 0827 Last data filed at 08/30/2023 0800 Gross per 24 hour  Intake 1157.15 ml  Output 2000 ml  Net -842.85 ml   Filed Weights   08/28/23 0257 08/29/23 0310 08/30/23 0400  Weight: 134.6 kg 135.5 kg 135.4 kg   Physical Examination: General: Chronically ill-appearing older woman in NAD. Resting comfortably in bed. HEENT: [place]/AT, anicteric sclera, PERRL, dry mucosa Neuro: Awake, interactive, nonfocal, appears weak, mild asterixis CV: RRR, no m/g/r. PULM: Decreased breath sounds bilateral, no accessory muscle use GI: Obese, soft, nontender, nondistended. Extremities: Bilateral chronic-appearing 3+ woody LE edema noted. Wraps/dressings in place. Skin: Warm/dry, BLE skin changes c/w chronic venous insufficiency.  Labs show improving hyponatremia 126, hypokalemia 2.9, hypophosphatemia 2.4, leukocytosis  Resolved Problem List:   Assessment and Plan:   Acute-on-chronic hypoxic and hypercapnic respiratory failure OHS/OSA noncompliance with CPAP Possible HCAP Gold 3 COPD Restrictive lung disease from obesity - BiPAP QHS and with naps PRN - Supplemental O2 support as needed to maintain goal SpO2 - Wean FiO2 for sat > 90% - Triple therapy bronchodilators + DuoNebs PRN - Pulmonary hygiene (IS/flutter), encourage OOB/mobilization as able with PT/OT/lift assistance - S/p cefepime [place] course - DYS 3 diet per SLP   Acute Pulmonary edema 2/2 to decompensated HFpEF - Diuresis with monitoring of sodium and renal function  Suspected  hypervolemic hyponatremia  -Improving,  renal involved   Summary :  This patient has demonstrated compliance with BiPAP during hospital stay and we should try to get her a machine on discharge.  DNR/DNI noted  The patient continues to exhibit signs of hypercapnea associated with chronic respiratory failure secondary to severe COPD .  Interruption or failure to provide NIV would quickly lead to exacerbation of the patient's condition, hospital readmission, and likely harm the patient.  Continued use is preferred.  The use of the NIV will treat the patient's PCO2 levels and can reduce the risk of exacerbations and future hospitalizations when used at night and during the day.  Bilevel/RAD therapy with and without a rate would be ineffective as the patient requires a volume targeted mode.  Ventilation is required to decrease the work of breathing and improve pulmonary status.  Interruption of ventilator support would lead to a decline of health status.  Patient is able to protect their airways and clear secretions on their own.   Signature:   Harden ROCKFORD Jude, MD [facility] 08/30/23 8:27 AM  Please see [email or web address] for pager details.  From 7A-7P if no response, please call [phone] After hours, please call ELink [phone]

## 2023-08-30 NOTE — Plan of Care (Signed)

## 2023-08-30 NOTE — TOC Progression Note (Addendum)
 Transition of Care [facility]) - Progression Note   Patient Details  Name: Morgan Odonnell MRN: [id number] Date of Birth: 07/18/1950  Transition of Care [facility]) CM/SW Contact  Duwaine GORMAN Aran, LCSW Phone Number: 08/30/2023, 3:20 PM  Clinical Narrative: CSW made 2 attempts to follow up with spouse regarding bed choice for SNF. CSW requested call back once bed choice has been made.  Addendum: CSW received approval from spouse to provide update to son, Morgan Odonnell; contact information added to chart. CSW provided update to son. Family to review bed offers. Son confirmed patient will need PTAR to be transported to SNF.  Expected Discharge Plan: Skilled Nursing Facility Barriers to Discharge: Continued Medical Work up  Expected Discharge Plan and Services In-house Referral: NA Living arrangements for the past 2 months: Single Family Home            DME Arranged: N/A DME Agency: NA HH Arranged: NA HH Agency: NA  Social Determinants of Health (SDOH) Interventions SDOH Screenings   Food Insecurity: No Food Insecurity (08/16/2023)  Housing: Low Risk  (08/16/2023)  Transportation Needs: No Transportation Needs (08/16/2023)  Utilities: Not At Risk (08/16/2023)  Depression (PHQ2-9): Medium Risk (01/13/2019)  Social Connections: Moderately Integrated (08/16/2023)  Tobacco Use: Medium Risk (08/15/2023)   Readmission Risk Interventions    08/17/2023    9:31 AM  Readmission Risk Prevention Plan  Transportation Screening Complete  PCP or Specialist Appt within 5-7 Days Complete  Home Care Screening Complete  Medication Review (RN CM) Complete

## 2023-08-30 NOTE — Progress Notes (Signed)
 Progress Note   Patient: Morgan Odonnell FMW:[id number] DOB: [birth year] DOA: 08/15/2023     15 DOS: the patient was seen and examined on 08/30/2023   Brief hospital course: 73 year old woman complex PMH including COPD, restrictive lung disease, OSA, OHS, morbid obesity presented with somnolence, shortness of breath.  Admitted for acute on chronic hypoxic and hypercapnic respiratory failure, acute on chronic diastolic CHF.  Noncompliant with mask/BiPAP, eventually decompensated again and was transferred to stepdown unit and seen by pulmonology.  Did successfully tolerate the mask last night, plan to observe again tonight, if tolerates, can likely transfer out of unit tomorrow.  Eventually SNF when sodium and other electrolytes stable.  Consultants Pulmonology Nephrology   Procedures/Events 6/6 - Admitted w/ acute on chronic diastolic HF, vol overload, hypoxic and hypercarbic resp failure  6/7 - Echo with LVEF 50 to 55%, low normal function; G1DD (impaired relaxation), elevated left atrial pressure, normal RV/RV function. 6/11 - PCCM consulted.  6/16 - Decreased responsiveness, brought back to SDU for closer monitoring. WBC spiked to 34, placed on cefepime  and azithro.  Assessment and Plan: Acute on chronic hypoxic and hypercapnic respiratory failure  Obesity hypoventilation Probable pulmonary hypertension secondary to chronic hypoxia Restrictive lung disease from obesity Noncompliance with CPAP/OSA GOLD 3 COPD Possible pneumonia Baseline oxygen  supplementation 3-4 L nasal cannula Currently at baseline.  Completed antibiotics. Hospitalizations been complicated by noncompliance with NIV. Tolerated NIV 2 nights in a row Risk for rehospitalization is high, especially given noncompliance.  Appreciate PMD.  Plan outpatient palliative. [employer] can set up patient for home BiPAP/NIV, but not for SNF so the order will have to be completed on the day of discharge so that it is valid for 30 days  which will allow set up when the patient returns home. [employer] will not be able to provide a BiPAP/NIV in a SNF, so patient will need to be accepted to a SNF that can provide BiPAP/NIV.    Acute on chronic diastolic CHF  Down 19L.  Follow clinically. 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  Continue urea .  Follow clinically.  Appears asymptomatic. Nephrology signed off.   Hypophosphatemia Hypomagnesemia Hypokalemia Replete.  Repeat again in AM.   Dysphagia Diet per SLP MBS without overt aspiration, continue DYS 3 diet, appreciate SLP recs   Normocytic anemia Mild, stable   Hypothyroidism TSH was high on admission, this can be followed up in the outpatient setting, consider sick euthyroid.  Repeat testing in the outpatient setting.   Chronic lymphedema Longstanding since teenager, last seen in wound care center 12/2022, see wound care note 6/7. Recommend outpatient follow-up with wound care center or lymphedema clinic   Chronic pain Patient remains on pregabalin  and as needed baclofen    Morbid obesity Body mass index is 54.64 kg/m.   Improving.  Transfer out to progressive.  If continues to tolerate gentle IV, anticipate transfer to [facility] 6/23.     Subjective:  Tolerated NIV last night Eating fine Breathing better No concerns expressed Husband at bedside  Physical Exam: Vitals:   08/30/23 0400 08/30/23 0435 08/30/23 0700 08/30/23 0800  BP: 106/63   (!) 122/43  Pulse: 82 86 90 94  Resp: 19 (!) 21 18 (!) 23  Temp: 97.7 F (36.5 C)     TempSrc: Oral     SpO2: 93% 97% 94% 91%  Weight:  135.4 kg     Height:       Physical Exam Vitals reviewed.  Constitutional:      General: She is not in acute distress.    Appearance: She is not ill-appearing or toxic-appearing.   Cardiovascular:     Rate and  Rhythm: Normal rate and regular rhythm.     Heart sounds: No murmur heard.    Comments: Telemetry SR Pulmonary:     Effort: Pulmonary effort is normal. No respiratory distress.     Breath sounds: No wheezing, rhonchi or rales.   Musculoskeletal:     Right lower leg: Edema present.     Left lower leg: Edema present.   Neurological:     Mental Status: She is alert.   Psychiatric:        Mood and Affect: Mood normal.        Behavior: Behavior normal.     Data Reviewed: Urine output 2000 -19.6 L since admission Sodium stable at 126.  Potassium still 2.9.  Phosphorus low at 2.4 but improved compared to yesterday.  Magnesium  borderline at 1.8. Hemoglobin stable 11.5.  Family Communication: Husband at bedside  Disposition: Status is: Inpatient Remains inpatient appropriate because: Respiratory status Plan SNF     Time spent: 25 minutes  Author: Toribio Door, MD 08/30/2023 9:05 AM  For on call review [email or web address].

## 2023-08-30 NOTE — Progress Notes (Signed)
   08/29/23 2350  BiPAP/CPAP/SIPAP  $ Non-Invasive Home Ventilator  Subsequent  BiPAP/CPAP/SIPAP Pt Type Adult  BiPAP/CPAP/SIPAP DREAMSTATIOND  Mask Type Full face mask  Dentures removed? Not applicable  Mask Size Medium  Respiratory Rate 20 breaths/min  IPAP 18 cmH20  EPAP 8 cmH2O  Flow Rate 5 lpm  Patient Home Machine No  Patient Home Mask No  Patient Home Tubing No  Auto Titrate No  CPAP/SIPAP surface wiped down Yes  Device Plugged into RED Power Outlet Yes  Oxygen  Percent 40 %  BiPAP/CPAP /SiPAP Vitals  Pulse Rate 88  Resp 20  SpO2 95 %  MEWS Score/Color  MEWS Score 0  MEWS Score Color Landy

## 2023-08-30 NOTE — Progress Notes (Signed)
   08/30/23 2300  BiPAP/CPAP/SIPAP  $ Face Mask Medium Yes  BiPAP/CPAP/SIPAP Pt Type Adult  BiPAP/CPAP/SIPAP DREAMSTATIOND  Mask Type Full face mask  Dentures removed? Not applicable  Mask Size Medium  Respiratory Rate 24 breaths/min  IPAP 18 cmH20  EPAP 8 cmH2O  Flow Rate 5 lpm  Patient Home Machine No  Patient Home Mask No  Patient Home Tubing No  Auto Titrate No  CPAP/SIPAP surface wiped down Yes  Device Plugged into RED Power Outlet Yes  BiPAP/CPAP /SiPAP Vitals  Resp (!) 25  MEWS Score/Color  MEWS Score 1  MEWS Score Color Green

## 2023-08-30 NOTE — Progress Notes (Addendum)
 eLink Physician-Brief Progress Note Patient Name: Morgan Odonnell DOB: Jul 04, 1950 MRN: [id number]   Date of Service  08/30/2023  HPI/Events of Note  K+ 2.9, Phos 2.4, Mag 1.8  Crt 0.47  Has scheduled K+ 20mEq  oral BID  eICU Interventions  Sod phos 15, Klor con 60 meq and 2 gm mag sulfite ordered.      Intervention Category Intermediate Interventions: Electrolyte abnormality - evaluation and management  Morgan Odonnell 08/30/2023, 5:23 AM

## 2023-08-30 NOTE — Progress Notes (Signed)
  [id number] [facility] Liaison Note   Received request for outpatient palliative services from [place], [place] at discharge.   [place] requested we reach out to spouse, to discuss services.   Contacted Mr.David Gullatt and introduced self and [facility] and reason for my call. Mr. Wollenberg asked if we could please reach out tomorrow for further discussion. I agreed to do so.    Please call with any questions or concerns.   Thank you for the opportunity to participate in this patients care.   Nat Babe, BSN, [employer]  [phone]

## 2023-08-31 DIAGNOSIS — E662 Morbid (severe) obesity with alveolar hypoventilation: Secondary | ICD-10-CM | POA: Diagnosis not present

## 2023-08-31 DIAGNOSIS — I5033 Acute on chronic diastolic (congestive) heart failure: Secondary | ICD-10-CM | POA: Diagnosis not present

## 2023-08-31 DIAGNOSIS — J9621 Acute and chronic respiratory failure with hypoxia: Secondary | ICD-10-CM | POA: Diagnosis not present

## 2023-08-31 LAB — BASIC METABOLIC PANEL WITH GFR
Anion gap: 11 (ref 5–15)
BUN: 31 mg/dL — ABNORMAL HIGH (ref 8–23)
CO2: 31 mmol/L (ref 22–32)
Calcium: 9.1 mg/dL (ref 8.9–10.3)
Chloride: 87 mmol/L — ABNORMAL LOW (ref 98–111)
Creatinine, Ser: 0.43 mg/dL — ABNORMAL LOW (ref 0.44–1.00)
GFR, Estimated: >60 mL/min (ref >60)
Glucose, Bld: 72 mg/dL (ref 70–99)
Potassium: 4 mmol/L (ref 3.5–5.1)
Sodium: 129 mmol/L — ABNORMAL LOW (ref 135–145)

## 2023-08-31 LAB — MAGNESIUM: Magnesium: 1.8 mg/dL (ref 1.7–2.4)

## 2023-08-31 LAB — PHOSPHORUS: Phosphorus: 2.7 mg/dL (ref 2.5–4.6)

## 2023-08-31 MED ORDER — MAGNESIUM CITRATE PO SOLN
1.0000 | Freq: Once | ORAL | Status: AC
  Administered 2023-08-31: 1 via ORAL
  Filled 2023-08-31: qty 296

## 2023-08-31 MED ORDER — FUROSEMIDE 40 MG PO TABS
40.0000 mg | ORAL_TABLET | Freq: Every evening | ORAL | Status: DC
  Administered 2023-08-31: 40 mg via ORAL
  Filled 2023-08-31: qty 1

## 2023-08-31 MED ORDER — FUROSEMIDE 40 MG PO TABS
80.0000 mg | ORAL_TABLET | Freq: Every day | ORAL | Status: DC
  Administered 2023-09-01: 80 mg via ORAL
  Filled 2023-08-31: qty 2

## 2023-08-31 NOTE — Progress Notes (Signed)
 Speech Language Pathology Treatment: Dysphagia  Patient Details Name: Morgan Odonnell MRN: [id number] DOB: Oct 14, 1950 Today's Date: 08/31/2023 Time: 8459-8394 SLP Time Calculation (min) (ACUTE ONLY): 25 min  Assessment / Plan / Recommendation Clinical Impression  Pt transferred out of ICU and follow up for dysphagia management indicated. Her swallow precaution signs did not transit with her to the floor. SLP greeted pt awake in bed with spouse at bedside. Pt with improved timing of verbal responses as well as improved efficiency of swallow evidenced by more timely oral transiting of masticated solids. She continues to require double swallows with small bites - but with full clearance of soft within approx 1 minute.     On MBS, she required puree to clear due to oral weakness-  therefore she is improving significantly. Pt observed today with thin soda, graham cracker and ice cream.  No s/s of aspiration across all po trials.  She is performing chin tuck without cues to conduct- and given it was found helpful to decrease laryngeal penetration, recommend continue its usage.    At this itme, pt does not need to use liquids or puree to clear unless noted. If she uses liquids - recommend she continue chin tuck posture to improve airway closure.   She has made significant improvement re: swallowing.  She however reports poor intake due to her constipation for which she takes magnesium  citrate at home.    Recommend SLP will follow up to initiate RMST to improve laryngeal closure/airway protection and tongue base retraction to help mitigate dysphagia. Pt and spouse agreeable to plan.  Morgan Odonnell     HPI HPI: 32 YOF w/ hx of OHS/OSA, CHRF on 3 L O2, restrictive lung disease secondary to obesity, morbid obesity, HFpEF, hypothyroidism, mood disorder, chronic pain presented with concern for increased somnolence at home. At time of interview patient is awake alert and fully oriented. She reports that her husband has  been more concerned because over the past few days she has been sleeping more often.She reports that she has had worsening shortness of breath, chest pressure at rest, cough productive with white sputum over the past week. No fever, chills. She has chronic lymphedema thinks that her legs have become more swollen. She has chronic orthopnea. She has previously been intolerant of noninvasive ventilation with sleep and does not use at home. Workup in the ED were notable for labs showed BNP 821 bicarb 31 hemoglobin 9 previously 13 g, chest x-ray with basilar atelectasis RT>LT. Blood gases pH 7.4 PCO278 pO2 less than 30 compensated picture Patient admitted for increased somnolence with acute on chronic hypoxic and hypercapnic respiratory failure per MD note.      SLP Plan  Continue with current plan of care          Recommendations  Diet recommendations: Dysphagia 3 (mechanical soft);Thin liquid Liquids provided via: Straw Medication Administration: Other (Comment) Supervision: Full supervision/cueing for compensatory strategies Compensations: Small sips/bites;Slow rate;Other (Comment) (chin tuck if use liquids to clear solids from mouth) Postural Changes and/or Swallow Maneuvers: Seated upright 90 degrees;Upright 30-60 min after meal                      Frequent or constant Supervision/Assistance Dysphagia, unspecified (R13.10)     Continue with current plan of care    Morgan POUR, MS [facility] SLP Acute Rehab Services Office [phone]  Morgan Odonnell  08/31/2023, 5:45 PM

## 2023-08-31 NOTE — Progress Notes (Signed)
   08/31/23 2309  BiPAP/CPAP/SIPAP  BiPAP/CPAP/SIPAP Pt Type Adult  BiPAP/CPAP/SIPAP DREAMSTATIOND  Mask Type Full face mask  Dentures removed? Not applicable  Mask Size Medium  Respiratory Rate 18 breaths/min  Flow Rate 5 lpm  Patient Home Machine No  Patient Home Mask No  Patient Home Tubing No  Auto Titrate Yes  Minimum cmH2O 8 cmH2O  Maximum cmH2O 25 cmH2O  Device Plugged into RED Power Outlet Yes

## 2023-08-31 NOTE — Progress Notes (Signed)
 Progress Note   Patient: Morgan Odonnell FMW:[id number] DOB: [birth year] DOA: 08/15/2023     16 DOS: the patient was seen and examined on 08/31/2023   Brief hospital course: 73 year old woman complex PMH including COPD, restrictive lung disease, OSA, OHS, morbid obesity presented with somnolence, shortness of breath.  Admitted for acute on chronic hypoxic and hypercapnic respiratory failure, acute on chronic diastolic CHF.  Noncompliant with mask/BiPAP, eventually decompensated again and was transferred to stepdown unit and seen by pulmonology.  Tolerating NIV.  Anticipate transfer to [facility] 6/23.  Consultants Pulmonology Nephrology  Palliative Medicine  Procedures/Events 6/6 - Admitted w/ acute on chronic diastolic HF, vol overload, hypoxic and hypercarbic resp failure  6/7 - Echo with LVEF 50 to 55%, low normal function; G1DD (impaired relaxation), elevated left atrial pressure, normal RV/RV function. 6/11 - PCCM consulted.  6/16 - Decreased responsiveness, brought back to SDU for closer monitoring. WBC spiked to 34, placed on cefepime  and azithro.  Assessment and Plan: Acute on chronic hypoxic and hypercapnic respiratory failure  Obesity hypoventilation Probable pulmonary hypertension secondary to chronic hypoxia Restrictive lung disease from obesity Noncompliance with CPAP/OSA GOLD 3 COPD Possible pneumonia Baseline oxygen  supplementation 3-4 L nasal cannula Currently at baseline.  Completed antibiotics. Hospitalizations been complicated by noncompliance with NIV. Tolerated NIV 3 nights in a row, the last night was only partial. Risk for rehospitalization is high, especially given noncompliance.  Appreciate PMD.  Plan outpatient palliative. [employer] can set up patient for home BiPAP/NIV, but not for SNF so the order will have to be completed on the day of discharge so that it is valid for 30 days which will allow set up when the patient returns home. [employer] will not be able to provide a  BiPAP/NIV in a SNF, so patient will need to be accepted to a SNF that can provide BiPAP/NIV.  Outpatient palliative consult   Acute on chronic diastolic CHF Down 17.9L.  Follow clinically. 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  Continue urea .  Follow clinically.  Appears asymptomatic. Nephrology signed off.   Hypophosphatemia--resolved Hypomagnesemia--resolved Hypokalemia--resolved   Dysphagia Diet per SLP MBS without overt aspiration, continue DYS 3 diet, appreciate SLP recs   Normocytic anemia Mild, stable   Hypothyroidism TSH was high on admission, this can be followed up in the outpatient setting, consider sick euthyroid.  Repeat testing in the outpatient setting.   Chronic lymphedema Longstanding since teenager, last seen in wound care center 12/2022, see wound care note 6/7. Recommend outpatient follow-up with wound care center or lymphedema clinic   Chronic pain Pregabalin , PRN baclofen    Morbid obesity Body mass index is 54.64 kg/m.   Overall stable.  Anticipate transfer to SNF tomorrow.      Subjective:  Feels better.  She will only use the NIV partially overnight.  Physical Exam: Vitals:   08/31/23 0459 08/31/23 0500 08/31/23 0901 08/31/23 1305  BP: (!) 125/58  136/60 (!) 123/52  Pulse: 92  95 95  Resp: (!) 22   20  Temp: 97.9 F (36.6 C)  97.7 F (36.5 C) 98 F (36.7 C)  TempSrc: Oral  Oral Oral  SpO2: 96%  93% 92%  Weight:  135.7 kg    Height:       Physical Exam Vitals reviewed.  Constitutional:      General: She is not in acute distress.    Appearance: She  is not ill-appearing or toxic-appearing.   Cardiovascular:     Rate and Rhythm: Normal rate and regular rhythm.     Heart sounds: No murmur heard. Pulmonary:     Effort: Pulmonary effort is normal. No respiratory distress.     Breath sounds: No wheezing,  rhonchi or rales.   Neurological:     Mental Status: She is alert.   Psychiatric:        Mood and Affect: Mood normal.        Behavior: Behavior normal.     Data Reviewed: Sodium stable 129, creatinine stable  Family Communication: Husband at bedside  Disposition: Status is: Inpatient Remains inpatient appropriate because: await SNF     Time spent: 20 minutes  Author: Toribio Door, MD 08/31/2023 3:42 PM  For on call review [email or web address].

## 2023-08-31 NOTE — Plan of Care (Signed)
  Problem: Clinical Measurements: Goal: Diagnostic test results will improve Outcome: Progressing   Problem: Clinical Measurements: Goal: Respiratory complications will improve Outcome: Progressing   Problem: Clinical Measurements: Goal: Cardiovascular complication will be avoided Outcome: Progressing   Problem: Nutrition: Goal: Adequate nutrition will be maintained Outcome: Progressing   

## 2023-08-31 NOTE — Progress Notes (Signed)
 Patient asked to turn off BiPAP. Placed her back on oxygen  at 3 lpm.

## 2023-08-31 NOTE — Progress Notes (Signed)
 [id number] [facility] Liaison Note   Received request for outpatient palliative services from [place], [place] at discharge.   [place] requested we reach out to spouse, to discuss services.   Attempted to contact Mr.David Masur via phone. Unable to reach. Message left. Will continue to follow up.   Please call with any questions or concerns.   Thank you for the opportunity to participate in this patients care.   Nat Babe, BSN, [employer]  [phone]

## 2023-09-01 DIAGNOSIS — J418 Mixed simple and mucopurulent chronic bronchitis: Secondary | ICD-10-CM | POA: Diagnosis not present

## 2023-09-01 DIAGNOSIS — R1310 Dysphagia, unspecified: Secondary | ICD-10-CM

## 2023-09-01 DIAGNOSIS — Z7189 Other specified counseling: Secondary | ICD-10-CM | POA: Diagnosis not present

## 2023-09-01 DIAGNOSIS — E662 Morbid (severe) obesity with alveolar hypoventilation: Secondary | ICD-10-CM | POA: Diagnosis not present

## 2023-09-01 DIAGNOSIS — J189 Pneumonia, unspecified organism: Secondary | ICD-10-CM | POA: Diagnosis not present

## 2023-09-01 DIAGNOSIS — J9621 Acute and chronic respiratory failure with hypoxia: Secondary | ICD-10-CM | POA: Diagnosis not present

## 2023-09-01 DIAGNOSIS — G4733 Obstructive sleep apnea (adult) (pediatric): Secondary | ICD-10-CM | POA: Diagnosis not present

## 2023-09-01 DIAGNOSIS — Z515 Encounter for palliative care: Secondary | ICD-10-CM | POA: Diagnosis not present

## 2023-09-01 DIAGNOSIS — I5033 Acute on chronic diastolic (congestive) heart failure: Secondary | ICD-10-CM | POA: Diagnosis not present

## 2023-09-01 DIAGNOSIS — J9622 Acute and chronic respiratory failure with hypercapnia: Secondary | ICD-10-CM | POA: Diagnosis not present

## 2023-09-01 LAB — BASIC METABOLIC PANEL WITH GFR
Anion gap: 10 (ref 5–15)
BUN: 28 mg/dL — ABNORMAL HIGH (ref 8–23)
CO2: 33 mmol/L — ABNORMAL HIGH (ref 22–32)
Calcium: 9.4 mg/dL (ref 8.9–10.3)
Chloride: 86 mmol/L — ABNORMAL LOW (ref 98–111)
Creatinine, Ser: 0.48 mg/dL (ref 0.44–1.00)
GFR, Estimated: >60 mL/min (ref >60)
Glucose, Bld: 74 mg/dL (ref 70–99)
Potassium: 3.6 mmol/L (ref 3.5–5.1)
Sodium: 129 mmol/L — ABNORMAL LOW (ref 135–145)

## 2023-09-01 LAB — PHOSPHORUS: Phosphorus: 2 mg/dL — ABNORMAL LOW (ref 2.5–4.6)

## 2023-09-01 LAB — MAGNESIUM: Magnesium: 2.6 mg/dL — ABNORMAL HIGH (ref 1.7–2.4)

## 2023-09-01 MED ORDER — POTASSIUM PHOSPHATES 15 MMOLE/5ML IV SOLN
30.0000 mmol | Freq: Once | INTRAVENOUS | Status: AC
  Administered 2023-09-01: 30 mmol via INTRAVENOUS
  Filled 2023-09-01: qty 10

## 2023-09-01 MED ORDER — OXYCODONE-ACETAMINOPHEN 5-325 MG PO TABS: 1.0000 | ORAL_TABLET | Freq: Three times a day (TID) | ORAL | 0 refills | Status: DC | PRN

## 2023-09-01 MED ORDER — UREA 15 G PO PACK: 15.0000 g | PACK | Freq: Two times a day (BID) | ORAL | Status: DC

## 2023-09-01 MED ORDER — POLYETHYLENE GLYCOL 3350 17 G PO PACK: 17.0000 g | PACK | Freq: Every day | ORAL | Status: DC | PRN

## 2023-09-01 MED ORDER — OXYCODONE-ACETAMINOPHEN 5-325 MG PO TABS
1.0000 | ORAL_TABLET | Freq: Three times a day (TID) | ORAL | 0 refills | Status: DC | PRN
Start: 2023-09-01 — End: 2023-09-26

## 2023-09-01 MED ORDER — FUROSEMIDE 40 MG PO TABS: ORAL_TABLET | ORAL | Status: DC

## 2023-09-01 MED ORDER — POTASSIUM CHLORIDE 20 MEQ PO PACK: 20.0000 meq | PACK | Freq: Two times a day (BID) | ORAL | Status: DC

## 2023-09-01 MED ORDER — FUROSEMIDE 40 MG PO TABS: ORAL_TABLET | ORAL | 1 refills | Status: DC

## 2023-09-01 NOTE — Progress Notes (Signed)
 NAME:  Morgan Odonnell, MRN:  [id number], DOB:  1951-03-07, LOS: 17 ADMISSION DATE:  08/15/2023, CONSULTATION DATE:  08/20/2023 REFERRING MD:  CHRISTOBAL - TRH, CHIEF COMPLAINT: Acute-on-chronic hypercapnic respiratory failure  History of Present Illness:  73 year old woman admitted to [facility] 6/6 with decreased responsiveness, AMS and hypersomnolence. PMHx significant for tobacco abuse, obesity, chronic hypoxic and hypercarbic resp failure, OHS/OSA, COPD GOLD C.  At baseline, patient lives with husband. Decreased activity ~ 1 yr. Sleeping in recliner/essentially living in recliner for several months. About a month PTA was last time she was able to walk with significant assistance from husband.  Baseline serum bicarb 38, BNP elevated 800, +congestion on CXR; VBG w/ pCO2 78, bicarb 51 and pH 7.42. Admitted w/ working dx of acute diastolic HF. Therapeutic interventions included: supplemental oxygen , aggressive diuresis, NIPPV.  Mental status improved, currently s/p > 10L diuresis (weight 342 to 314 lbs from time of admit to 6/11).   Patient was nearing discharge and PCCM asked to evaluate/assist with setting up home nocturnal ventilatory assistance.  Pertinent Medical History:  Obesity w/ associated restrictive lung disease, currently untreated OSA and OHS, prior smoker, chronic bronchitis, COPD FeV1 57% FeV1/FVC 70% baseline exertional dyspnea, chronic hypoxic & hypercarbic resp failure; HFpEF, hypothyroidism, chronic lymphedema, chronic pain, chronic orthopnea   Significant Hospital Events: Including procedures, antibiotic start and stop dates in addition to other pertinent events   6/6 - Admitted w/ acute on chronic diastolic HF, vol overload, hypoxic and hypercarbic resp failure  6/7 - Echo with LVEF 50 to 55%, low normal function; G1DD (impaired relaxation), elevated left atrial pressure, normal RV/RV function. 6/11 - PCCM consulted.  6/16 - Decreased responsiveness, brought back to SDU for closer  monitoring. WBC spiked to 34, placed on cefepime  and azithro.  Interim History / Subjective:   NIV used overnight with good effect. Resting comfortably. Feels like today is a better day.   Objective:   Blood pressure (!) 102/50, pulse 80, temperature 98.1 F (36.7 C), temperature source Oral, resp. rate 20, height 5' 2 (1.575 m), weight 135.7 kg, SpO2 94%.        Intake/Output Summary (Last 24 hours) at 09/01/2023 0905 Last data filed at 09/01/2023 0604 Gross per 24 hour  Intake 360 ml  Output 450 ml  Net -90 ml   Filed Weights   08/29/23 0310 08/30/23 0400 08/31/23 0500  Weight: 135.5 kg 135.4 kg 135.7 kg   Physical Examination: General: elderly, chronically ill woman, obese, laying flat HEENT: dry mucosa Neuro: somnolent but awakens to voice and able to carry conversation. Very weak and deconditioned CV: diminished RRR PULM: diminished, bibasilar crackles, no wheeze GI: obese, soft Extremities: bilateral lower extremity edema with dressings in place Skin: chronic venous stasis, thin skin  Labs reviewed Na 129 K 3.6 CO2 33 bicarb 86  Blood gas shows hypercapnic and hypoxemic respiratory failure improved with NIV  Resolved Problem List:   Assessment and Plan:   Acute-on-chronic hypoxic and hypercapnic respiratory failure OHS/OSA noncompliance with CPAP Possible HCAP Gold 3 COPD Restrictive lung disease from obesity - continue NIV QHS and with naps PRN - Supplemental O2 support as needed to maintain goal SpO2 - Wean FiO2 for sat > 90% - Triple therapy bronchodilators + DuoNebs PRN - Pulmonary hygiene (IS/flutter), encourage OOB/mobilization as able with PT/OT/lift assistance - S/p cefepime [place] course - DYS 3 diet per SLP  Acute Pulmonary edema 2/2 to decompensated HFpEF - Diuresis with monitoring of sodium and renal function  Suspected  hypervolemic hyponatremia -Improving, renal involved   Summary :  This patient has demonstrated compliance with  BiPAP during hospital stay and we should try to get her a machine on discharge.  DNR/DNI noted  The patient continues to exhibit signs of hypercapnea associated with chronic respiratory failure secondary to severe COPD .  Interruption or failure to provide NIV would quickly lead to exacerbation of the patient's condition, hospital readmission, and likely harm the patient.  Continued use is preferred.  The use of the NIV will treat the patient's PCO2 levels and can reduce the risk of exacerbations and future hospitalizations when used at night and during the day.  Bilevel/RAD therapy with and without a rate would be ineffective as the patient requires a volume targeted mode.  Ventilation is required to decrease the work of breathing and improve pulmonary status.  Interruption of ventilator support would lead to a decline of health status.  Patient is able to protect their airways and clear secretions on their own.  Disposition in place for SNF likely today.  PCCM is available as needed. Please call with questions.   Verdon Gore, MD Pulmonary and Critical Care Medicine [facility] 09/01/2023 9:09 AM Pager: see AMION  If no response to pager, please call critical care on call (see AMION) until 7pm After 7:00 pm call Elink

## 2023-09-01 NOTE — Progress Notes (Signed)
 Physical Therapy Treatment Patient Details Name: Morgan Odonnell MRN: [id number] DOB: 1950/04/23 Today's Date: 09/01/2023   History of Present Illness 73 year old female admitted on 08/15/23 for increased somnolence with acute on chronic hypoxic and hypercapnic respiratory failure. 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 Remained on [place] O2 throughout session-sats dropped to 86% on [place] O2 with activity. Pt sat EOB for at least 8 minutes on today with CGA for sitting balance. Practiced bed mobility: supine>sit and sit>supine transitions. Utilized maximove for safe transfer from bed to recliner on today. Bil knee pain and weakness limiting mobility on today. Encouraged pt to sit up as tolerated. Patient will benefit from continued inpatient follow up therapy, <3 hours/day    If plan is discharge home, recommend the following: Two people to help with walking and/or transfers;Assist for transportation;Supervision due to cognitive status;Two people to help with bathing/dressing/bathroom   Can travel by private vehicle     No  Equipment Recommendations  None recommended by PT    Recommendations for Other Services       Precautions / Restrictions Precautions Precautions: Fall Precaution/Restrictions Comments: Fragile skin, O2 dep Restrictions Weight Bearing Restrictions Per Provider Order: No     Mobility  Bed Mobility Overal bed mobility: Needs Assistance Bed Mobility: Rolling, Supine to Sit, Sit to Supine Rolling: Mod assist, +2 for physical assistance, +2 for safety/equipment   Supine to sit: Mod assist, +2 for physical assistance, +2 for safety/equipment, HOB elevated, Used rails Sit to supine: Mod assist, +2 for physical assistance, +2 for safety/equipment, HOB elevated,  Used rails   General bed mobility comments: Increased time. Assist for trunk and bil LEs. Utilized bedpad to assist with positioning, scooting. Sat EOB for at least 8 minutes with CGA for balance. Pt stated she could not peform and LAQs due to bil knee pain.    Transfers Overall transfer level: Needs assistance   Transfers: Bed to chair/wheelchair/BSC             General transfer comment: Bil LEs weak and painful. Utilized Maximove for safe transfer to recliner. Transfer via Lift Equipment: Maximove  Ambulation/Gait                   Stairs             Wheelchair Mobility     Tilt Bed    Modified Rankin (Stroke Patients Only)       Balance Overall balance assessment: Needs assistance Sitting-balance support: Bilateral upper extremity supported, Feet supported Sitting balance-Leahy Scale: Fair                                      Hotel manager: Impaired Factors Affecting Communication: Reduced clarity of speech  Cognition Arousal: Alert Behavior During Therapy: WFL for tasks assessed/performed   PT - Cognitive impairments: Problem solving, Sequencing                         Following commands: Impaired Following commands impaired: Follows one step commands with increased time    Cueing Cueing Techniques: Verbal cues, Gestural cues, Tactile cues, Visual cues  Exercises General Exercises - Lower Extremity Ankle Circles/Pumps: AROM, Both, 15  reps, Supine Heel Slides:  (pt could not tolerate heel slides on today due to pain in bil knees)    General Comments        Pertinent Vitals/Pain Pain Assessment Pain Assessment: Faces Faces Pain Scale: Hurts little more Pain Location: bil LEs Pain Descriptors / Indicators: Grimacing Pain Intervention(s): Limited activity within patient's tolerance, Monitored during session, Repositioned    Home Living                          Prior  Function            PT Goals (current goals can now be found in the care plan section) Progress towards PT goals: Progressing toward goals    Frequency    Min 2X/week      PT Plan      Co-evaluation              AM-PAC PT 6 Clicks Mobility   Outcome Measure  Help needed turning from your back to your side while in a flat bed without using bedrails?: A Lot Help needed moving from lying on your back to sitting on the side of a flat bed without using bedrails?: Total Help needed moving to and from a bed to a chair (including a wheelchair)?: Total Help needed standing up from a chair using your arms (e.g., wheelchair or bedside chair)?: Total Help needed to walk in hospital room?: Total Help needed climbing 3-5 steps with a railing? : Total 6 Click Score: 7    End of Session Equipment Utilized During Treatment: Gait belt Activity Tolerance: Patient tolerated treatment well;Patient limited by pain Patient left: in chair;with call bell/phone within reach;with chair alarm set (with feet on floor at pt's request-for a change in knee positioning) Nurse Communication: Mobility status;Need for lift equipment PT Visit Diagnosis: Muscle weakness (generalized) (M62.81);Difficulty in walking, not elsewhere classified (R26.2);History of falling (Z91.81)     Time: 1026-1106 PT Time Calculation (min) (ACUTE ONLY): 40 min  Charges:    $Therapeutic Activity: 38-52 mins PT General Charges $$ ACUTE PT VISIT: 1 Visit                        Dannial SQUIBB, PT Acute Rehabilitation  Office: [phone]

## 2023-09-01 NOTE — Discharge Summary (Addendum)
 Physician Discharge Summary   Patient: Morgan Odonnell MRN: [id number] DOB: 03-07-1951  Admit date:     08/15/2023  Discharge date: 09/01/23  Discharge Physician: Toribio Door   PCP: Yolande Toribio MATSU, MD   Recommendations at discharge:   Acute on chronic hypoxic and hypercapnic respiratory failure  Obesity hypoventilation Probable pulmonary hypertension secondary to chronic hypoxia Restrictive lung disease from obesity Noncompliance with CPAP/OSA GOLD 3 COPD Possible pneumonia Baseline oxygen  supplementation 3-4 L nasal cannula NIV each night [facility] can set up patient for home BiPAP/NIV, but not for SNF so the order will have to be completed on the day of discharge so that it is valid for 30 days which will allow set up when the patient returns home. [facility] will not be able to provide a BiPAP/NIV in a SNF, so patient will need to be accepted to a SNF that can provide BiPAP/NIV.  Outpatient palliative consult COPD exacerbation ruled out   Hyponatremia Continue urea .   BMP in 1 week   Dysphagia Diet per SLP MBS without overt aspiration, continue DYS 3 diet, appreciate SLP recs   Hypothyroidism TSH was high on admission, this can be followed up in the outpatient setting, consider sick euthyroid.  Repeat testing in the outpatient setting.     Wound care  2 times daily      Comments: Bilateral legs: Cleanse the legs with CHG wash cloths, removing the dear skin. Moisture with Eurecin. Apply Petrolatum on the anterior side of left knee (open wound). Wrap with Kerlix and ACE wrap, since the bottom of her toes, until the knees, apply a light compress beginning on the ankle to bellow the knee.   Wound care  Every shift         Wound care  Daily      Comments: Cleanse underneath pannus/abdominal folds with soap and water , dry and apply Interdry AG as follows: Order Gerlean # [id number] Measure and cut length of InterDry to fit in skin folds that have skin breakdown Tuck InterDry  fabric into skin folds in a single layer, allow for 2 inches of overhang from skin edges to allow for wicking to occur May remove to bathe; dry area thoroughly and then tuck into affected areas again Do not apply any creams or ointments when using InterDry DO NOT THROW AWAY FOR 5 DAYS unless soiled with stool DO NOT [facility] product, this will inactivate the silver in the material  New sheet of Interdry should be applied after 5 days of use if patient continues to have skin breakdown      Discharge Diagnoses: Principal Problem:   Acute on chronic respiratory failure with hypoxia and hypercapnia (HCC) Active Problems:   COPD (chronic obstructive pulmonary disease)Gold C   Lymphedema   Morbid obesity with BMI of 50.0-59.9, adult (HCC)   OSA (obstructive sleep apnea)   Acute on chronic diastolic CHF (congestive heart failure) (HCC)   Obesity hypoventilation syndrome (HCC)   Restrictive lung disease   Normocytic anemia   HCAP (healthcare-associated pneumonia)   Hyponatremia   Dysphagia   Palliative care encounter   Counseling and coordination of care   Goals of care, counseling/discussion   Heart failure (HCC)   Need for emotional support  Resolved Problems:   * No resolved hospital problems. *  Hospital Course: 73 year old woman complex PMH including COPD, restrictive lung disease, OSA, OHS, morbid obesity presented with somnolence, shortness of breath.  Admitted for acute on chronic hypoxic and hypercapnic respiratory failure, acute  on chronic diastolic CHF.  Noncompliant with mask/BiPAP, eventually decompensated again and was transferred to stepdown unit and seen by pulmonology.  Tolerating NIV.  Medically stable for transfer to SNF.  Consultants Pulmonology Nephrology  Palliative Medicine  Procedures/Events 6/6 - Admitted w/ acute on chronic diastolic HF, vol overload, hypoxic and hypercarbic resp failure  6/7 - Echo with LVEF 50 to 55%, low normal function; G1DD (impaired  relaxation), elevated left atrial pressure, normal RV/RV function. 6/11 - PCCM consulted.  6/16 - Decreased responsiveness, brought back to SDU for closer monitoring. WBC spiked to 34, placed on cefepime  and azithro.  Acute on chronic hypoxic and hypercapnic respiratory failure  Obesity hypoventilation Probable pulmonary hypertension secondary to chronic hypoxia Restrictive lung disease from obesity Noncompliance with CPAP/OSA GOLD 3 COPD Possible pneumonia Baseline oxygen  supplementation 3-4 L nasal cannula Currently at baseline.  Completed antibiotics. Hospitalization complicated by noncompliance with NIV. Tolerated NIV 4 nights in a row  Risk for rehospitalization is high. Appreciate PMD.  Plan outpatient palliative. TOC confirmed facility has appropriate NIV. [facility] can set up patient for home BiPAP/NIV, but not for SNF so the order will have to be completed on the day of discharge so that it is valid for 30 days which will allow set up when the patient returns home. [facility] will not be able to provide a BiPAP/NIV in a SNF, so patient will need to be accepted to a SNF that can provide BiPAP/NIV.  Outpatient palliative consult   Acute on chronic diastolic CHF Down 15.4L.  Follow clinically. 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  Continue urea .  Follow clinically.  Appears asymptomatic. Nephrology signed off. BMP in 1 week   Hypophosphatemia--replete Hypomagnesemia--repleted Hypokalemia--resolved   Dysphagia Diet per SLP MBS without overt aspiration, continue DYS 3 diet, appreciate SLP recs   Normocytic anemia Mild, stable   Hypothyroidism TSH was high on admission, this can be followed up in the outpatient setting, consider sick euthyroid.  Repeat testing in the outpatient setting.   Chronic lymphedema Longstanding since teenager, last seen in wound  care center 12/2022, see wound care note 6/7. Recommend outpatient follow-up with wound care center or lymphedema clinic   Chronic pain Pregabalin , PRN baclofen    Morbid obesity Body mass index is 54.64 kg/m.  WOC Nurse Consult Note: Reason for Consult: requested to assess bilateral legs edema. Wound type: Full thickness on left leg, anterior side of her left knee. No skin breakdown or leaking on the other leg. Edema 4+/4+. The pt does not use any type of compressive therapy, the lags are too swelling, and the compressive sock didn't fit anymore. Pressure Injury POA: NA Measurement: anterior left knee - 1.5 cm x 0.5 cm, and 0.5 cm x 0.5 cm.  Wound bed: 100% red. Partial healed. Drainage (amount, consistency, odor) Minimum, serous, no odor. Periwound: intact, partial macerate. Bilateral legs cover by scabs. Dressing procedure/placement/frequency: Bilateral legs: Cleanse the legs with CHG wash cloths. Moisture with Eurecin. Apply Petrolatum on the anterior side of left knee (open wound). Wrap with Kerlix and ACE wrap, since the bottom of her toes, until the knees, apply a light compress beginning on the ankle to bellow the knee.   Stable for transfer to SNF.  Pain control - [place]  Controlled Substance Reporting System database was reviewed.    Disposition: Skilled nursing facility Diet recommendation:  Diet Orders (From admission, onward)  Start     Ordered   08/30/23 0809  DIET DYS 3 Room service appropriate? Yes; Fluid consistency: Thin; Fluid restriction: 2000 mL Fluid  Diet effective now       Question Answer Comment  Room service appropriate? Yes   Fluid consistency: Thin   Fluid restriction: 2000 mL Fluid      08/30/23 9191            DISCHARGE MEDICATION: Allergies as of 09/01/2023       Reactions   Gabapentin    slept for 2 days   Novocain [procaine] Other (See Comments)   intolerance        Medication List     STOP taking these  medications    baclofen  10 MG tablet Commonly known as: LIORESAL    cyclobenzaprine 7.5 MG tablet Commonly known as: FEXMID   metoprolol  tartrate 25 MG tablet Commonly known as: LOPRESSOR    potassium chloride  SA 20 MEQ tablet Commonly known as: KLOR-CON  M       TAKE these medications    AeroChamber Z-Stat Plus Chambr Misc Use as directed   albuterol  108 (90 Base) MCG/ACT inhaler Commonly known as: VENTOLIN  HFA Inhale 2 puffs into the lungs every 6 (six) hours as needed for wheezing.   aspirin  EC 81 MG tablet Take 1 tablet (81 mg total) by mouth daily.   budesonide -formoterol  160-4.5 MCG/ACT inhaler Commonly known as: Symbicort  INHALE TWO puffs into lung TWICE DAILY   citalopram  20 MG tablet Commonly known as: CELEXA  Take 1 tablet (20 mg total) by mouth daily.   diclofenac  sodium 1 % Gel Commonly known as: VOLTAREN  Apply 1 application topically 4 (four) times daily. Applied to knees, shoulders, wrists, and ankles   furosemide  40 MG tablet Commonly known as: LASIX  Takes 2 tables in morning and 1 tab in evening What changed: additional instructions   levothyroxine  200 MCG tablet Commonly known as: SYNTHROID  Take 1 tablet (200 mcg total) by mouth daily before breakfast.   naloxone  4 MG/0.1ML Liqd nasal spray kit Commonly known as: NARCAN  Place 1 spray into the nose once.   nicotine  polacrilex 4 MG gum Commonly known as: NICORETTE  Take 4 mg by mouth as needed for smoking cessation.   oxyCODONE -acetaminophen  5-325 MG tablet Commonly known as: PERCOCET/ROXICET Take 1 tablet by mouth every 8 (eight) hours as needed for severe pain (pain score 7-10). What changed: when to take this   polyethylene glycol 17 g packet Commonly known as: MIRALAX  / GLYCOLAX  Take 17 g by mouth daily as needed for mild constipation.   potassium chloride  20 MEQ packet Commonly known as: KLOR-CON  Take 20 mEq by mouth 2 (two) times daily.   pregabalin  75 MG capsule Commonly known  as: LYRICA  Take 75 mg by mouth every 12 (twelve) hours.   propranolol  10 MG tablet Commonly known as: INDERAL  Take 10 mg by mouth 2 (two) times daily as needed.   Relistor 150 MG Tabs Generic drug: Methylnaltrexone Bromide Take 3 tablets by mouth daily.   tiotropium 18 MCG inhalation capsule Commonly known as: Spiriva  HandiHaler Place 1 capsule (18 mcg total) into inhaler and inhale daily.   urea  15 g Pack oral packet Commonly known as: URE-NA Take 15 g by mouth 2 (two) times daily.   Vitamin D -3 125 MCG (5000 UT) Tabs Take 1 tablet by mouth daily.               Discharge Care Instructions  (From admission, onward)  Start     Ordered   09/01/23 0000  Discharge wound care:       Comments: Wound care  2 times daily      Comments: Bilateral legs: Cleanse the legs with CHG wash cloths, removing the dear skin. Moisture with Eurecin. Apply Petrolatum on the anterior side of left knee (open wound). Wrap with Kerlix and ACE wrap, since the bottom of her toes, until the knees, apply a light compress beginning on the ankle to bellow the knee.   Wound care  Every shift         Wound care  Daily      Comments: Cleanse underneath pannus/abdominal folds with soap and water , dry and apply Interdry AG as follows: Order Gerlean # [id number] Measure and cut length of InterDry to fit in skin folds that have skin breakdown Tuck InterDry fabric into skin folds in a single layer, allow for 2 inches of overhang from skin edges to allow for wicking to occur May remove to bathe; dry area thoroughly and then tuck into affected areas again Do not apply any creams or ointments when using InterDry DO NOT THROW AWAY FOR 5 DAYS unless soiled with stool DO NOT [facility] product, this will inactivate the silver in the material  New sheet of Interdry should be applied after 5 days of use if patient continues to have skin breakdown   09/01/23 1453            Contact information for follow-up  providers     Yolande Toribio MATSU, MD. Schedule an appointment as soon as possible for a visit in 4 week(s).   Specialty: Internal Medicine Contact information: [address] [place] [address] [phone]              Contact information for after-discharge care     Destination     [facility]   Service: Skilled Nursing Contact information: [address] [place]   [phone]                    Feels better  Discharge Exam: Filed Weights   08/29/23 0310 08/30/23 0400 08/31/23 0500  Weight: 135.5 kg 135.4 kg 135.7 kg   Physical Exam Vitals reviewed.  Constitutional:      General: She is not in acute distress.    Appearance: She is not ill-appearing or toxic-appearing.   Cardiovascular:     Rate and Rhythm: Normal rate and regular rhythm.     Heart sounds: No murmur heard. Pulmonary:     Effort: Pulmonary effort is normal. No respiratory distress.     Breath sounds: No wheezing, rhonchi or rales.   Neurological:     Mental Status: She is alert.   Psychiatric:        Mood and Affect: Mood normal.        Behavior: Behavior normal.      Condition at discharge: good  The results of significant diagnostics from this hospitalization (including imaging, microbiology, ancillary and laboratory) are listed below for reference.   Imaging Studies: DG Swallowing Func-Speech Pathology Result Date: 08/27/2023 Table formatting from the original result was not included. Modified Barium Swallow Study Patient Details Name: Morgan Odonnell MRN: [id number] Date of Birth: 1951/01/27 Today's Date: 08/27/2023 HPI/PMH: HPI: 76 YOF w/ hx of OHS/OSA, CHRF on 3 L O2, restrictive lung disease secondary to obesity, morbid obesity, HFpEF, hypothyroidism, mood disorder, chronic pain presented with concern for increased somnolence at home.  At time of interview patient is awake alert and fully oriented. She reports  that her husband has been more concerned because over the past few days she has been sleeping more often.She reports that she has had worsening shortness of breath, chest pressure at rest, cough productive with white sputum over the past week. No fever, chills. She has chronic lymphedema thinks that her legs have become more swollen. She has chronic orthopnea. She has previously been intolerant of noninvasive ventilation with sleep and does not use at home. Workup in the ED were notable for labs showed BNP 821 bicarb 31 hemoglobin 9 previously 13 g, chest x-ray with basilar atelectasis RT>LT. Blood gases pH 7.4 PCO278 pO2 less than 30 compensated picture Patient admitted for increased somnolence with acute on chronic hypoxic and hypercapnic respiratory failure per MD note. Clinical Impression: Clinical Impression: Patient presents with moderate oral and minimal pharyngeal dysphagia without aspiration of any consistency tested. She demonstrates significant oral control impairments with lingual pumping, prolonged transiting with oral retention post-swallow without consistent awareness.  Retention also adhered to hard palate - . Use of pudding helpful to transit masticated solids into pharynx.   In addition, base of tongue retention noted without awareness.  Laryngeal penetration of thin liquid occured secondary to poor oral control with premature spillage into pharynx and decreased laryngeal closure - and this occured largely consistently.  Chin tuck continued to allow penetration but appeared less so and if pt finds this strategy helpful, it is advised she utlize this with liquids. Barium tablet taken with pudding easily transited through oropharynx and appeared to clear esophagus without delay.  No aspiration observed and pt did cough x1 during testing. Factors that may increase risk of adverse event in presence of aspiration Noe & Lianne 2021): Factors that may increase risk of adverse event in presence of  aspiration Noe & Lianne 2021): Frail or deconditioned; Dependence for feeding and/or oral hygiene; Poor general health and/or compromised immunity; Reduced cognitive function Recommendations/Plan: Swallowing Evaluation Recommendations Swallowing Evaluation Recommendations Recommendations: PO diet PO Diet Recommendation: Dysphagia 3 (Mechanical soft); Thin liquids (Level 0) Liquid Administration via: Straw; Cup Medication Administration: Other (Comment) (as tolerated) Supervision: Staff to assist with self-feeding Swallowing strategies  : Slow rate; Small bites/sips; Chin tuck (use puree to aid oral clearance of solid particles) Postural changes: Stay upright 30-60 min after meals; Out of bed for meals Oral care recommendations: Oral care QID (4x/day) Treatment Plan Treatment Plan Treatment recommendations: Therapy as outlined in treatment plan below Functional status assessment: Patient has had a recent decline in their functional status and demonstrates the ability to make significant improvements in function in a reasonable and predictable amount of time. Treatment frequency: Min 1x/week Treatment duration: 2 weeks Interventions: Aspiration precaution training; Respiratory muscle strength training; Compensatory techniques; Patient/family education; Diet toleration management by SLP Recommendations Recommendations for follow up therapy are one component of a multi-disciplinary discharge planning process, led by the attending physician.  Recommendations may be updated based on patient status, additional functional criteria and insurance authorization. Assessment: Orofacial Exam: Orofacial Exam Oral Cavity: Oral Hygiene: WFL Oral Cavity - Dentition: Missing dentition Orofacial Anatomy: WFL Oral Motor/Sensory Function: Generalized oral weakness Anatomy: Anatomy: WFL Boluses Administered: Boluses Administered Boluses Administered: Thin liquids (Level 0); Mildly thick liquids (Level 2, nectar thick); Puree; Solid   Oral Impairment Domain: Oral Impairment Domain Lip Closure: Escape from interlabial space or lateral juncture, no extension beyond vermillion border Tongue control during bolus hold: Escape to lateral buccal cavity/floor  of mouth Bolus preparation/mastication: Slow prolonged chewing/mashing with complete recollection Bolus transport/lingual motion: Repetitive/disorganized tongue motion Oral residue: Residue collection on oral structures Location of oral residue : Tongue Initiation of pharyngeal swallow : Pyriform sinuses; Posterior laryngeal surface of the epiglottis  Pharyngeal Impairment Domain: Pharyngeal Impairment Domain Soft palate elevation: No bolus between soft palate (SP)/pharyngeal wall (PW) Laryngeal elevation: Partial superior movement of thyroid  cartilage/partial approximation of arytenoids to epiglottic petiole Anterior hyoid excursion: Partial anterior movement Epiglottic movement: Complete inversion Laryngeal vestibule closure: Incomplete, narrow column air/contrast in laryngeal vestibule Pharyngeal stripping wave : Present - diminished Pharyngeal contraction (A/P view only): N/A Pharyngoesophageal segment opening: Partial distention/partial duration, partial obstruction of flow Tongue base retraction: Trace column of contrast or air between tongue base and PPW Pharyngeal residue: Collection of residue within or on pharyngeal structures Location of pharyngeal residue: Valleculae  Esophageal Impairment Domain: Esophageal Impairment Domain Esophageal clearance upright position: Complete clearance, esophageal coating Pill: Pill Consistency administered: Puree Penetration/Aspiration Scale Score: Penetration/Aspiration Scale Score 1.  Material does not enter airway: Mildly thick liquids (Level 2, nectar thick); Moderately thick liquids (Level 3, honey thick); Puree; Solid; Pill 2.  Material enters airway, remains ABOVE vocal cords then ejected out: Thin liquids (Level 0) Compensatory Strategies:  Compensatory Strategies Compensatory strategies: Yes Other(comment): Ineffective (count 1,2,3.. swallow; chin tuck, cue to organize and swallow, reflexive dry swallows weak and ineffective)   General Information: Caregiver present: Yes  Diet Prior to this Study: Regular; Thin liquids (Level 0)   Temperature : Normal   Respiratory Status: WFL   Supplemental O2: Nasal cannula   History of Recent Intubation: No  Behavior/Cognition: Alert; Cooperative; Distractible Self-Feeding Abilities: Dependent for feeding Baseline vocal quality/speech: Hypophonia/low volume Volitional Cough: Unable to elicit Volitional Swallow: Unable to elicit (did not conduct consistently) Exam Limitations: Limited visibility; Fatigue Goal Planning: Prognosis for improved oropharyngeal function: Fair Barriers to Reach Goals: Time post onset; Overall medical prognosis; Cognitive deficits No data recorded Patient/Family Stated Goal: none stated, spouse present Consulted and agree with results and recommendations: Patient; Family member/caregiver Pain: Pain Assessment Pain Assessment: No/denies pain Faces Pain Scale: 4 Breathing: 0 Negative Vocalization: 0 Facial Expression: 0 Body Language: 0 Consolability: 0 PAINAD Score: 0 Pain Location: B LE with bed mobility Pain Descriptors / Indicators: Grimacing Pain Intervention(s): Limited activity within patient's tolerance; Monitored during session End of Session: Start Time:SLP Start Time (ACUTE ONLY): 1201 Stop Time: SLP Stop Time (ACUTE ONLY): 1231 Time Calculation:SLP Time Calculation (min) (ACUTE ONLY): 30 min Charges: SLP Evaluations $ SLP Speech Visit: 1 Visit SLP Evaluations $BSS Swallow: 1 Procedure $MBS Swallow: 1 Procedure $Swallowing Treatment: 1 Procedure SLP visit diagnosis: SLP Visit Diagnosis: Dysphagia, unspecified (R13.10) Past Medical History: Past Medical History: Diagnosis Date  Allergies   Anemia   Back pain   Bilateral swelling of feet   CHF (congestive heart failure) (HCC)    Chronic cough   COPD (chronic obstructive pulmonary disease) (HCC)   Depression   GERD (gastroesophageal reflux disease)   Heart murmur   Hypertension   Hypocapnia   Hypothyroid   Knee pain   Leg edema   chronic, bilateral  Lightheaded   Low back pain   Morbid obesity (HCC)   Neck pain   Obesity hypoventilation syndrome (HCC)   Oral lesion   Shortness of breath   Shortness of breath   Shoulder pain   Snoring  Past Surgical History: Past Surgical History: Procedure Laterality Date  APPENDECTOMY    CHOLECYSTECTOMY  COLONOSCOPY N/A 06/25/2013  Procedure: COLONOSCOPY;  Surgeon: Belvie JONETTA Just, MD;  Location: [facility];  Service: Endoscopy;  Laterality: N/A;  ECTOPIC PREGNANCY SURGERY    Left Ovary Removed    MENISCUS REPAIR    TONSILLECTOMY    TUBAL LIGATION   [name], MS [facility] SLP Acute Rehab Services Office [phone] [name] 08/27/2023, 2:18 PM  DG CHEST PORT 1 VIEW Result Date: 08/25/2023 CLINICAL DATA:  Respiratory failure EXAM: PORTABLE CHEST 1 VIEW COMPARISON:  August 24, 2023 FINDINGS: Persistent bilateral pulmonary interstitial infiltrates correlate with bilateral congestive changes, superimposed pneumonia is not excluded with significant opacity of the left lower lobe which correlate with collapse or consolidation. Small left pleural effusion or reaction Heart is slightly enlarged left ventricular configuration IMPRESSION: Persistent bilateral pulmonary interstitial infiltrates correlate with bilateral congestive changes, superimposed pneumonia is not excluded with significant opacity of the left lower lobe which correlate with collapse or consolidation. Electronically Signed   By: Franky Chard M.D.   On: 08/25/2023 11:42   DG CHEST PORT 1 VIEW Result Date: 08/24/2023 CLINICAL DATA:  [id number] Dyspnea [id number] EXAM: PORTABLE CHEST 1 VIEW COMPARISON:  August 15, 2023 FINDINGS: The cardiomediastinal silhouette is unchanged and enlarged in contour. Similar LEFT-sided pleural blunting. No pneumothorax.  Perihilar vascular congestion with diffuse reticular prominence and peribronchial cuffing, similar compared to prior. There is homogeneous opacification of the LEFT retrocardiac region IMPRESSION: 1. Similar appearance of pulmonary edema. 2. Homogeneous opacification of the LEFT retrocardiac region could reflect atelectasis versus infection. Electronically Signed   By: Corean Salter M.D.   On: 08/24/2023 11:41   ECHOCARDIOGRAM COMPLETE Result Date: 08/16/2023    ECHOCARDIOGRAM REPORT   Patient Name:   PAULINE PEGUES Date of Exam: 08/16/2023 Medical Rec #:  [id number]          Height:       62.0 in Accession #:    [id number]         Weight:       294.5 lb Date of Birth:  06-Apr-1950          BSA:          2.254 m Patient Age:    73 years           BP:           131/65 mmHg Patient Gender: F                  HR:           78 bpm. Exam Location:  Inpatient Procedure: 2D Echo, Cardiac Doppler, Color Doppler and Intracardiac            Opacification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

## 2023-09-01 NOTE — Progress Notes (Signed)
 Daily Progress Note   Patient Name: Morgan Odonnell       Date: 09/01/2023 DOB: [birth year]  Age: 73 y.o. MRN#: [id number] Attending Physician: Morgan Toribio SQUIBB, MD Primary Care Physician: Morgan Toribio MATSU, MD Admit Date: 08/15/2023 Length of Stay: 17 days  Reason for Consultation/Follow-up: Establishing goals of care  Subjective:   CC: Patient feeling better sitting up in chair at bedside.  Subjective:  Reviewed EMR prior to presenting to bedside.  Reviewed recent documentation from TOC, PT, and TOC.  Patient medically cleared for discharge and awaiting placement to SNF for rehab.  Presented to bedside to see patient.  Patient sitting up in bedside chair.  RN assisting with care.  Patient feeling better at this time and ready to go to rehab.  Encouraged patient for care moving forward.  Patient supposed to have outpatient palliative medicine follow-up through [facility].  Discussed care with hospitalist to coordinate care.  Palliative medicine team available if needed.  Objective:   Vital Signs:  BP (!) 102/50 (BP Location: Right Arm)   Pulse 80   Temp 98.1 F (36.7 C) (Oral)   Resp 20   Ht 5' 2 (1.575 m)   Wt 135.7 kg   SpO2 94%   BMI 54.72 kg/m   Physical Exam: General: NAD, awake, interactive, chronically ill-appearing Cardiovascular: RRR, no edema in LE b/l Respiratory: no increased work of breathing noted, not in respiratory distress, on O2 nasal cannula support Abdomen: not distended Extremities: Lymphedema in lower extremities bilaterally with wraps in place Neuro: Awake, interactive Psych: appropriately answers all questions  Assessment & Plan:   Assessment: Patient is a 73 year old female with a past medical history of COPD, restrictive lung disease, OSA, OHS, CHF, dysphagia, hypothyroidism, chronic lymphedema via, and chronic pain who was admitted on 08/15/2023 for management of somnolence and shortness of breath.  During hospitalization patient has  received management for acute on chronic hypoxic and hypercapnic respiratory failure in the setting of OHS, COPD, and possible pneumonia.  Patient also having noncompliance with CPAP for management causing worsening status and transfer to stepdown unit.  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  Palliative medicine team consulted to assist with complex medical decision making.   Recommendations/Plan: # Complex medical decision making/goals of care:      - Discussed care with patient as detailed above in HPI. Patient has continued BiPAP use for respiratory support.  Patient planning to rehab to attempt to regain strength.  Patient is hopeful to enjoy more quality time at home with her family as this is what brings her joy.  Discussed with patient and husband already importance of continued open conversations regarding medical planning moving forward.  Encouraged patient and husband to discussed that there may come a time when returning to the hospital does not benefit patient's quality of life and then would need to consider focusing on time at home with appropriate symptom management to allow time with family.  Consulted TOC to assist with outpatient palliative medicine referral to continue engaging in conversations moving forward.                -  Code Status: Limited: Do not attempt resuscitation (DNR) -DNR-LIMITED -Do Not Intubate/DNI     # Psycho-social/Spiritual Support:  - Support System: Husband, daughter, granddaughter   # Discharge Planning:  Skilled Nursing Facility for rehab with Palliative care service follow-up  Discussed with: Patient, RN, hospitalist  Thank you for allowing the palliative care  team to participate in the care Morgan Odonnell.  Morgan Radar, DO Palliative Care Provider PMT # [phone]  If patient remains symptomatic despite maximum doses, please call PMT at [phone] between 0700 and 1900. Outside of these hours,  please call attending, as PMT does not have night coverage.

## 2023-09-01 NOTE — TOC Progression Note (Signed)
 Transition of Care [facility]) - Progression Note    Patient Details  Name: Morgan Odonnell MRN: [id number] Date of Birth: 1950/12/24  [facility]) CM/SW Contact  Keyden Pavlov, Nathanel, RN Phone Number: 09/01/2023, 10:59 AM  Clinical Narrative: Annabella chose [facility]-rep Rhonda aware. Will initiate auth. May need Bipap-facility aware,may need to order with settings provided-MD updated.      Expected Discharge Plan: Skilled Nursing Facility Barriers to Discharge: Continued Medical Work up  Expected Discharge Plan and Services In-house Referral: NA     Living arrangements for the past 2 months: Single Family Home                 DME Arranged: N/A DME Agency: NA       HH Arranged: NA HH Agency: NA         Social Determinants of Health (SDOH) Interventions SDOH Screenings   Food Insecurity: No Food Insecurity (08/16/2023)  Housing: Low Risk  (08/16/2023)  Transportation Needs: No Transportation Needs (08/16/2023)  Utilities: Not At Risk (08/16/2023)  Depression (PHQ2-9): Medium Risk (01/13/2019)  Social Connections: Moderately Integrated (08/16/2023)  Tobacco Use: Medium Risk (08/15/2023)    Readmission Risk Interventions    08/17/2023    9:31 AM  Readmission Risk Prevention Plan  Transportation Screening Complete  PCP or Specialist Appt within 5-7 Days Complete  Home Care Screening Complete  Medication Review (RN CM) Complete

## 2023-09-01 NOTE — TOC Progression Note (Addendum)
 Transition of Care [facility]) - Progression Note    Patient Details  Name: Morgan Odonnell MRN: [id number] Date of Birth: Nov 24, 1950  Transition of Care [facility]) CM/SW Contact  Stella Encarnacion, Nathanel, RN Phone Number: 09/01/2023, 12:11 PM  Clinical Narrative:  Family(spouse/son/niece) chose [facility]-rep rhonda accepted-will order Bipap;initiated shara pi#[id number] auth.[facility]-otpt [facility].  -3p we have auth,[facility] have bipap,they can accept today. MD updated.    Expected Discharge Plan: Skilled Nursing Facility Barriers to Discharge: Insurance Authorization  Expected Discharge Plan and Services In-house Referral: NA     Living arrangements for the past 2 months: Single Family Home                 DME Arranged: N/A DME Agency: NA       HH Arranged: NA HH Agency: NA         Social Determinants of Health (SDOH) Interventions SDOH Screenings   Food Insecurity: No Food Insecurity (08/16/2023)  Housing: Low Risk  (08/16/2023)  Transportation Needs: No Transportation Needs (08/16/2023)  Utilities: Not At Risk (08/16/2023)  Depression (PHQ2-9): Medium Risk (01/13/2019)  Social Connections: Moderately Integrated (08/16/2023)  Tobacco Use: Medium Risk (08/15/2023)    Readmission Risk Interventions    08/17/2023    9:31 AM  Readmission Risk Prevention Plan  Transportation Screening Complete  PCP or Specialist Appt within 5-7 Days Complete  Home Care Screening Complete  Medication Review (RN CM) Complete

## 2023-09-01 NOTE — TOC Transition Note (Signed)
 Transition of Care [facility]) - Discharge Note   Patient Details  Name: Morgan Odonnell MRN: [id number] Date of Birth: 16-Jun-1950  Transition [facility] Care [facility]) CM/SW Contact:  Bascom Service, RN Phone Number: 09/01/2023, 3:03 PM   Clinical Narrative: [place] for [facility] rep Rhonda accepted,has bed, & Bipap-going to rm#[id number],report #[phone],ext 0. PTAR called. DNR. No further CM needs.      Final next level of care: Skilled Nursing Facility Barriers to Discharge: No Barriers Identified   Patient Goals and CMS Choice Patient states their goals for this hospitalization and ongoing recovery are:: Rehab CMS Medicare.gov Compare Post Acute Care list provided to:: Patient Represenative (must comment) (Tiffany(niece)) Choice offered to / list presented to : NA      Discharge Placement PASRR number recieved: 08/30/23            Patient chooses bed at: [facility] Patient to be transferred to facility by: [facility] Name of family member notified: Lawrence(son) David(spouse) Patient and family notified of of transfer: 09/01/23  Discharge Plan and Services Additional resources added to the After Visit Summary for   In-house Referral: NA              DME Arranged: N/A DME Agency: NA       HH Arranged: NA HH Agency: NA        Social Drivers of Health (SDOH) Interventions SDOH Screenings   Food Insecurity: No Food Insecurity (08/16/2023)  Housing: Low Risk  (08/16/2023)  Transportation Needs: No Transportation Needs (08/16/2023)  Utilities: Not At Risk (08/16/2023)  Depression (PHQ2-9): Medium Risk (01/13/2019)  Social Connections: Moderately Integrated (08/16/2023)  Tobacco Use: Medium Risk (08/15/2023)     Readmission Risk Interventions    08/17/2023    9:31 AM  Readmission Risk Prevention Plan  Transportation Screening Complete  PCP or Specialist Appt within 5-7 Days Complete  Home Care Screening Complete  Medication Review (RN CM) Complete

## 2023-09-19 ENCOUNTER — Emergency Department (HOSPITAL_COMMUNITY)

## 2023-09-19 ENCOUNTER — Other Ambulatory Visit: Payer: Self-pay

## 2023-09-19 ENCOUNTER — Inpatient Hospital Stay (HOSPITAL_COMMUNITY)
Admission: EM | Admit: 2023-09-19 | Discharge: 2023-09-26 | DRG: 291 | Disposition: A | Discharge status: Skilled Nursing Facility | Source: Skilled Nursing Facility | Attending: Internal Medicine | Admitting: Internal Medicine

## 2023-09-19 ENCOUNTER — Encounter (HOSPITAL_COMMUNITY): Payer: Self-pay

## 2023-09-19 DIAGNOSIS — Z66 Do not resuscitate: Secondary | ICD-10-CM | POA: Diagnosis present

## 2023-09-19 DIAGNOSIS — Z79899 Other long term (current) drug therapy: Secondary | ICD-10-CM | POA: Diagnosis not present

## 2023-09-19 DIAGNOSIS — R042 Hemoptysis: Secondary | ICD-10-CM | POA: Diagnosis present

## 2023-09-19 DIAGNOSIS — J418 Mixed simple and mucopurulent chronic bronchitis: Secondary | ICD-10-CM | POA: Diagnosis not present

## 2023-09-19 DIAGNOSIS — J9610 Chronic respiratory failure, unspecified whether with hypoxia or hypercapnia: Secondary | ICD-10-CM | POA: Diagnosis present

## 2023-09-19 DIAGNOSIS — E662 Morbid (severe) obesity with alveolar hypoventilation: Secondary | ICD-10-CM | POA: Diagnosis present

## 2023-09-19 DIAGNOSIS — Z825 Family history of asthma and other chronic lower respiratory diseases: Secondary | ICD-10-CM | POA: Diagnosis not present

## 2023-09-19 DIAGNOSIS — Z8249 Family history of ischemic heart disease and other diseases of the circulatory system: Secondary | ICD-10-CM

## 2023-09-19 DIAGNOSIS — Z7982 Long term (current) use of aspirin: Secondary | ICD-10-CM

## 2023-09-19 DIAGNOSIS — J440 Chronic obstructive pulmonary disease with acute lower respiratory infection: Secondary | ICD-10-CM | POA: Diagnosis present

## 2023-09-19 DIAGNOSIS — E871 Hypo-osmolality and hyponatremia: Secondary | ICD-10-CM | POA: Diagnosis present

## 2023-09-19 DIAGNOSIS — Z91199 Patient's noncompliance with other medical treatment and regimen due to unspecified reason: Secondary | ICD-10-CM

## 2023-09-19 DIAGNOSIS — Z888 Allergy status to other drugs, medicaments and biological substances status: Secondary | ICD-10-CM

## 2023-09-19 DIAGNOSIS — J449 Chronic obstructive pulmonary disease, unspecified: Secondary | ICD-10-CM | POA: Diagnosis present

## 2023-09-19 DIAGNOSIS — E039 Hypothyroidism, unspecified: Secondary | ICD-10-CM | POA: Diagnosis present

## 2023-09-19 DIAGNOSIS — I5033 Acute on chronic diastolic (congestive) heart failure: Secondary | ICD-10-CM | POA: Diagnosis not present

## 2023-09-19 DIAGNOSIS — Z79890 Hormone replacement therapy (postmenopausal): Secondary | ICD-10-CM | POA: Diagnosis not present

## 2023-09-19 DIAGNOSIS — J9622 Acute and chronic respiratory failure with hypercapnia: Secondary | ICD-10-CM | POA: Diagnosis present

## 2023-09-19 DIAGNOSIS — Z6841 Body Mass Index (BMI) 40.0 and over, adult: Secondary | ICD-10-CM | POA: Diagnosis not present

## 2023-09-19 DIAGNOSIS — G4733 Obstructive sleep apnea (adult) (pediatric): Secondary | ICD-10-CM | POA: Diagnosis present

## 2023-09-19 DIAGNOSIS — K5909 Other constipation: Secondary | ICD-10-CM | POA: Diagnosis present

## 2023-09-19 DIAGNOSIS — J189 Pneumonia, unspecified organism: Principal | ICD-10-CM | POA: Diagnosis present

## 2023-09-19 DIAGNOSIS — I890 Lymphedema, not elsewhere classified: Secondary | ICD-10-CM

## 2023-09-19 DIAGNOSIS — E66813 Obesity, class 3: Secondary | ICD-10-CM | POA: Diagnosis present

## 2023-09-19 DIAGNOSIS — Z87891 Personal history of nicotine dependence: Secondary | ICD-10-CM | POA: Diagnosis not present

## 2023-09-19 DIAGNOSIS — J9611 Chronic respiratory failure with hypoxia: Secondary | ICD-10-CM | POA: Diagnosis not present

## 2023-09-19 DIAGNOSIS — J90 Pleural effusion, not elsewhere classified: Secondary | ICD-10-CM | POA: Diagnosis present

## 2023-09-19 DIAGNOSIS — J9621 Acute and chronic respiratory failure with hypoxia: Secondary | ICD-10-CM | POA: Diagnosis present

## 2023-09-19 DIAGNOSIS — Y95 Nosocomial condition: Secondary | ICD-10-CM | POA: Diagnosis present

## 2023-09-19 DIAGNOSIS — R590 Localized enlarged lymph nodes: Secondary | ICD-10-CM | POA: Diagnosis present

## 2023-09-19 DIAGNOSIS — I509 Heart failure, unspecified: Secondary | ICD-10-CM

## 2023-09-19 DIAGNOSIS — J9612 Chronic respiratory failure with hypercapnia: Secondary | ICD-10-CM

## 2023-09-19 DIAGNOSIS — J441 Chronic obstructive pulmonary disease with (acute) exacerbation: Secondary | ICD-10-CM | POA: Diagnosis present

## 2023-09-19 DIAGNOSIS — Z7951 Long term (current) use of inhaled steroids: Secondary | ICD-10-CM | POA: Diagnosis not present

## 2023-09-19 DIAGNOSIS — Z7189 Other specified counseling: Secondary | ICD-10-CM

## 2023-09-19 LAB — COMPREHENSIVE METABOLIC PANEL WITH GFR
ALT: 15 U/L (ref 0–44)
AST: 40 U/L (ref 15–41)
Albumin: 2.2 g/dL — ABNORMAL LOW (ref 3.5–5.0)
Alkaline Phosphatase: 89 U/L (ref 38–126)
Anion gap: 8 (ref 5–15)
BUN: 9 mg/dL (ref 8–23)
CO2: 33 mmol/L — ABNORMAL HIGH (ref 22–32)
Calcium: 8.5 mg/dL — ABNORMAL LOW (ref 8.9–10.3)
Chloride: 93 mmol/L — ABNORMAL LOW (ref 98–111)
Creatinine, Ser: 0.58 mg/dL (ref 0.44–1.00)
GFR, Estimated: >60 mL/min (ref >60)
Glucose, Bld: 93 mg/dL (ref 70–99)
Potassium: 3.8 mmol/L (ref 3.5–5.1)
Sodium: 134 mmol/L — ABNORMAL LOW (ref 135–145)
Total Bilirubin: 1.5 mg/dL — ABNORMAL HIGH (ref 0.0–1.2)
Total Protein: 7.9 g/dL (ref 6.5–8.1)

## 2023-09-19 LAB — BASIC METABOLIC PANEL WITH GFR
Anion gap: 6 (ref 5–15)
BUN: 11 mg/dL (ref 8–23)
CO2: 35 mmol/L — ABNORMAL HIGH (ref 22–32)
Calcium: 8.5 mg/dL — ABNORMAL LOW (ref 8.9–10.3)
Chloride: 93 mmol/L — ABNORMAL LOW (ref 98–111)
Creatinine, Ser: 0.57 mg/dL (ref 0.44–1.00)
GFR, Estimated: >60 mL/min (ref >60)
Glucose, Bld: 100 mg/dL — ABNORMAL HIGH (ref 70–99)
Potassium: 3.4 mmol/L — ABNORMAL LOW (ref 3.5–5.1)
Sodium: 134 mmol/L — ABNORMAL LOW (ref 135–145)

## 2023-09-19 LAB — CBC WITH DIFFERENTIAL/PLATELET
Abs Immature Granulocytes: 0.04 K/uL (ref 0.00–0.07)
Basophils Absolute: 0.1 K/uL (ref 0.0–0.1)
Basophils Relative: 1 %
Eosinophils Absolute: 0.3 K/uL (ref 0.0–0.5)
Eosinophils Relative: 3 %
HCT: 36.8 % (ref 36.0–46.0)
Hemoglobin: 11 g/dL — ABNORMAL LOW (ref 12.0–15.0)
Immature Granulocytes: 1 %
Lymphocytes Relative: 25 %
Lymphs Abs: 2.2 K/uL (ref 0.7–4.0)
MCH: 28.1 pg (ref 26.0–34.0)
MCHC: 29.9 g/dL — ABNORMAL LOW (ref 30.0–36.0)
MCV: 93.9 fL (ref 80.0–100.0)
Monocytes Absolute: 0.8 K/uL (ref 0.1–1.0)
Monocytes Relative: 9 %
Neutro Abs: 5.2 K/uL (ref 1.7–7.7)
Neutrophils Relative %: 61 %
Platelets: 219 K/uL (ref 150–400)
RBC: 3.92 MIL/uL (ref 3.87–5.11)
RDW: 18.6 % — ABNORMAL HIGH (ref 11.5–15.5)
WBC: 8.6 K/uL (ref 4.0–10.5)
nRBC: 0 % (ref 0.0–0.2)

## 2023-09-19 LAB — PROCALCITONIN: Procalcitonin: <0.1 ng/mL

## 2023-09-19 LAB — I-STAT CHEM 8, ED
BUN: 9 mg/dL (ref 8–23)
Calcium, Ion: 1.16 mmol/L (ref 1.15–1.40)
Chloride: 92 mmol/L — ABNORMAL LOW (ref 98–111)
Creatinine, Ser: 0.7 mg/dL (ref 0.44–1.00)
Glucose, Bld: 90 mg/dL (ref 70–99)
HCT: 38 % (ref 36.0–46.0)
Hemoglobin: 12.9 g/dL (ref 12.0–15.0)
Potassium: 3.9 mmol/L (ref 3.5–5.1)
Sodium: 137 mmol/L (ref 135–145)
TCO2: 39 mmol/L — ABNORMAL HIGH (ref 22–32)

## 2023-09-19 LAB — CBC
HCT: 36.2 % (ref 36.0–46.0)
Hemoglobin: 10.7 g/dL — ABNORMAL LOW (ref 12.0–15.0)
MCH: 28.1 pg (ref 26.0–34.0)
MCHC: 29.6 g/dL — ABNORMAL LOW (ref 30.0–36.0)
MCV: 95 fL (ref 80.0–100.0)
Platelets: 213 K/uL (ref 150–400)
RBC: 3.81 MIL/uL — ABNORMAL LOW (ref 3.87–5.11)
RDW: 18.3 % — ABNORMAL HIGH (ref 11.5–15.5)
WBC: 8.3 K/uL (ref 4.0–10.5)
nRBC: 0 % (ref 0.0–0.2)

## 2023-09-19 LAB — TYPE AND SCREEN
ABO/RH(D): O POS
Antibody Screen: NEGATIVE

## 2023-09-19 LAB — MAGNESIUM: Magnesium: 1.6 mg/dL — ABNORMAL LOW (ref 1.7–2.4)

## 2023-09-19 MED ORDER — CITALOPRAM HYDROBROMIDE 20 MG PO TABS
20.0000 mg | ORAL_TABLET | Freq: Every day | ORAL | Status: DC
  Administered 2023-09-19 – 2023-09-26 (×8): 20 mg via ORAL
  Filled 2023-09-19 (×8): qty 1

## 2023-09-19 MED ORDER — VANCOMYCIN HCL IN DEXTROSE 1-5 GM/200ML-% IV SOLN
1000.0000 mg | Freq: Once | INTRAVENOUS | Status: AC
  Administered 2023-09-19: 1000 mg via INTRAVENOUS
  Filled 2023-09-19: qty 200

## 2023-09-19 MED ORDER — ACETAMINOPHEN 325 MG PO TABS
650.0000 mg | ORAL_TABLET | Freq: Four times a day (QID) | ORAL | Status: DC | PRN
  Filled 2023-09-19: qty 2

## 2023-09-19 MED ORDER — FUROSEMIDE 10 MG/ML IJ SOLN
40.0000 mg | Freq: Two times a day (BID) | INTRAMUSCULAR | Status: DC
Start: 2023-09-19 — End: 2023-09-25
  Administered 2023-09-19 – 2023-09-25 (×12): 40 mg via INTRAVENOUS
  Filled 2023-09-19 (×12): qty 4

## 2023-09-19 MED ORDER — SODIUM CHLORIDE 0.9 % IV SOLN
2.0000 g | INTRAVENOUS | Status: DC
  Filled 2023-09-19: qty 12.5

## 2023-09-19 MED ORDER — GUAIFENESIN ER 600 MG PO TB12
600.0000 mg | ORAL_TABLET | Freq: Two times a day (BID) | ORAL | Status: DC
  Administered 2023-09-19 – 2023-09-26 (×14): 600 mg via ORAL
  Filled 2023-09-19 (×14): qty 1

## 2023-09-19 MED ORDER — ORAL CARE MOUTH RINSE
15.0000 mL | OROMUCOSAL | Status: DC
  Administered 2023-09-19 – 2023-09-21 (×7): 15 mL via OROMUCOSAL

## 2023-09-19 MED ORDER — FUROSEMIDE 10 MG/ML IJ SOLN
40.0000 mg | Freq: Once | INTRAMUSCULAR | Status: AC
  Administered 2023-09-19: 40 mg via INTRAVENOUS
  Filled 2023-09-19 (×2): qty 4

## 2023-09-19 MED ORDER — SODIUM CHLORIDE 0.9% FLUSH
3.0000 mL | Freq: Two times a day (BID) | INTRAVENOUS | Status: DC
  Administered 2023-09-19 – 2023-09-26 (×13): 3 mL via INTRAVENOUS

## 2023-09-19 MED ORDER — SODIUM CHLORIDE 0.9 % IV SOLN
2.0000 g | INTRAVENOUS | Status: AC
  Administered 2023-09-19 – 2023-09-23 (×5): 2 g via INTRAVENOUS
  Filled 2023-09-19 (×5): qty 20

## 2023-09-19 MED ORDER — ORAL CARE MOUTH RINSE: 15.0000 mL | OROMUCOSAL | Status: DC | PRN

## 2023-09-19 MED ORDER — LEVOTHYROXINE SODIUM 100 MCG PO TABS
200.0000 ug | ORAL_TABLET | Freq: Every day | ORAL | Status: DC
  Administered 2023-09-19 – 2023-09-26 (×8): 200 ug via ORAL
  Filled 2023-09-19 (×8): qty 2

## 2023-09-19 MED ORDER — IOHEXOL 350 MG/ML SOLN
100.0000 mL | Freq: Once | INTRAVENOUS | Status: AC | PRN
  Administered 2023-09-19: 100 mL via INTRAVENOUS

## 2023-09-19 MED ORDER — FLUTICASONE FUROATE-VILANTEROL 200-25 MCG/ACT IN AEPB
1.0000 | INHALATION_SPRAY | Freq: Every day | RESPIRATORY_TRACT | Status: DC
  Administered 2023-09-19 – 2023-09-26 (×8): 1 via RESPIRATORY_TRACT
  Filled 2023-09-19: qty 28

## 2023-09-19 MED ORDER — AZITHROMYCIN 500 MG PO TABS
500.0000 mg | ORAL_TABLET | Freq: Every day | ORAL | Status: AC
Start: 2023-09-19 — End: 2023-09-24
  Administered 2023-09-19 – 2023-09-23 (×5): 500 mg via ORAL
  Filled 2023-09-19 (×5): qty 1

## 2023-09-19 MED ORDER — IPRATROPIUM-ALBUTEROL 0.5-2.5 (3) MG/3ML IN SOLN
3.0000 mL | RESPIRATORY_TRACT | Status: DC | PRN
  Administered 2023-09-20 (×2): 3 mL via RESPIRATORY_TRACT
  Filled 2023-09-19 (×2): qty 3

## 2023-09-19 MED ORDER — POLYETHYLENE GLYCOL 3350 17 G PO PACK: 17.0000 g | PACK | Freq: Every day | ORAL | Status: DC | PRN

## 2023-09-19 MED ORDER — OXYCODONE HCL 5 MG PO TABS
5.0000 mg | ORAL_TABLET | ORAL | Status: DC | PRN
  Administered 2023-09-19 – 2023-09-26 (×15): 5 mg via ORAL
  Filled 2023-09-19 (×16): qty 1

## 2023-09-19 MED ORDER — UMECLIDINIUM BROMIDE 62.5 MCG/ACT IN AEPB
1.0000 | INHALATION_SPRAY | Freq: Every day | RESPIRATORY_TRACT | Status: DC
  Administered 2023-09-19 – 2023-09-26 (×8): 1 via RESPIRATORY_TRACT
  Filled 2023-09-19: qty 7

## 2023-09-19 MED ORDER — ACETAMINOPHEN 650 MG RE SUPP: 650.0000 mg | Freq: Four times a day (QID) | RECTAL | Status: DC | PRN

## 2023-09-19 MED ORDER — TIOTROPIUM BROMIDE MONOHYDRATE 18 MCG IN CAPS: 1.0000 | ORAL_CAPSULE | Freq: Every day | RESPIRATORY_TRACT | Status: DC

## 2023-09-19 MED ORDER — PROCHLORPERAZINE EDISYLATE 10 MG/2ML IJ SOLN: 5.0000 mg | Freq: Four times a day (QID) | INTRAMUSCULAR | Status: DC | PRN

## 2023-09-19 NOTE — TOC Initial Note (Signed)
 Transition of Care [facility]) - Initial/Assessment Note    Patient Details  Name: Morgan Odonnell MRN: [id number] Date of Birth: [birth year]  Transition of Care [facility]) CM/SW Contact:    Bascom Service, RN Phone Number: 09/19/2023, 1:50 PM  Clinical Narrative: From [facility] rep [place] aware for return when available. Await PT recc.                 Expected Discharge Plan: Skilled Nursing Facility Barriers to Discharge: Continued Medical Work up   Patient Goals and CMS Choice   CMS Medicare.gov Compare Post Acute Care list provided to:: Patient Represenative (must comment) (David(spouse)) Choice offered to / list presented to : Spouse [place] ownership interest in [facility].provided to:: Spouse    Expected Discharge Plan and Services     Post Acute Care Choice: Skilled Nursing Facility                                        Prior Living Arrangements/Services                       Activities of Daily Living   ADL Screening (condition at time of admission) Independently performs ADLs?: No Does the patient have a NEW difficulty with bathing/dressing/toileting/self-feeding that is expected to last >3 days?: No Does the patient have a NEW difficulty with getting in/out of bed, walking, or climbing stairs that is expected to last >3 days?: No Does the patient have a NEW difficulty with communication that is expected to last >3 days?: No Is the patient deaf or have difficulty hearing?: Yes Does the patient have difficulty seeing, even when wearing glasses/contacts?: No Does the patient have difficulty concentrating, remembering, or making decisions?: No  Permission Sought/Granted                  Emotional Assessment              Admission diagnosis:  Pleural effusion [J90] HCAP (healthcare-associated pneumonia) [J18.9] Hemoptysis [R04.2] Acute congestive heart failure, unspecified heart failure type (HCC) [I50.9] Acute on  chronic heart failure with preserved ejection fraction (HFpEF) (HCC) [I50.33] Patient Active Problem List   Diagnosis Date Noted   Acute on chronic heart failure with preserved ejection fraction (HFpEF) (HCC) 09/19/2023   Hyponatremia 08/29/2023   Dysphagia 08/29/2023   Palliative care encounter 08/29/2023   Counseling and coordination of care 08/29/2023   Goals of care, counseling/discussion 08/29/2023   Heart failure (HCC) 08/29/2023   Need for emotional support 08/29/2023   Multifocal pneumonia 08/26/2023   Normocytic anemia 08/16/2023   Precordial pain 02/21/2021   Exertional dyspnea 02/21/2021   Acute on chronic respiratory failure with hypoxia and hypercapnia (HCC) 06/27/2017   Hyperkalemia 06/27/2017   Acute renal failure superimposed on stage 2 chronic kidney disease (HCC) 06/27/2017   Restrictive lung disease 12/13/2015   Obesity hypoventilation syndrome (HCC) 05/10/2015   Chronic respiratory failure (HCC) 05/10/2015   Acute on chronic diastolic CHF (congestive heart failure) (HCC) 03/03/2015   OSA (obstructive sleep apnea) 03/02/2015   Morbid obesity with BMI of 50.0-59.9, adult (HCC) 10/18/2013   Class 3 severe obesity with serious comorbidity and body mass index (BMI) of 60.0 to 69.9 in adult 10/18/2013   Lymphedema 01/26/2013   Chronic diastolic CHF (congestive heart failure) (HCC) 01/04/2013   Chronic cough    COPD (chronic obstructive pulmonary disease)Gold  C    Snoring    Low back pain    Hypertension    PCP:  Pcp, No Pharmacy:  No Pharmacies Listed    Social Drivers of Health (SDOH) Social History: SDOH Screenings   Food Insecurity: No Food Insecurity (09/19/2023)  Housing: Low Risk  (09/19/2023)  Transportation Needs: No Transportation Needs (09/19/2023)  Utilities: Not At Risk (09/19/2023)  Depression (PHQ2-9): Medium Risk (01/13/2019)  Social Connections: Moderately Integrated (09/19/2023)  Tobacco Use: Medium Risk (09/19/2023)   SDOH Interventions:      Readmission Risk Interventions    08/17/2023    9:31 AM  Readmission Risk Prevention Plan  Transportation Screening Complete  PCP or Specialist Appt within 5-7 Days Complete  Home Care Screening Complete  Medication Review (RN CM) Complete

## 2023-09-19 NOTE — Plan of Care (Signed)

## 2023-09-19 NOTE — Progress Notes (Signed)
  Progress Note   Patient: Morgan Odonnell FMW:[id number] DOB: 1950/03/14 DOA: 09/19/2023     0 DOS: the patient was seen and examined on 09/19/2023   Brief hospital course: 73 y.o. female with medical history significant for COPD, OHS, OSA, chronic HFpEF, chronic hypoxic and hypercarbic respiratory failure, and hypothyroidism who presents with hemoptysis.   Patient was at her SNF and scheduled to return home today but developed hemoptysis last night that prompted her presentation to the ED.  Patient states that she began coughing overnight and was spitting up red blood.  She denies any associated chest pain.  She denies experiencing this previously.  She has not noticed any fever or chills.   ED Course: Upon arrival to the ED, patient is found to be afebrile and saturating mid 90s on 15 L/min of supplemental oxygen  with normal RR, normal HR, and stable BP.  Labs are most notable for normal creatinine, serum bicarbonate 33, albumin 2.2, and normal WBC.  CTA chest is negative for PE but notable for small pleural effusion, mild pulmonary edema, multifocal pneumonia, and new mediastinal and right hilar adenopathy.   Blood cultures were ordered and the patient was given 40 mg IV Lasix  and antibiotics.  Assessment and Plan: 1. Acute on chronic HFpEF; acute on chronic hypoxic & hypercarbic respiratory failure  - Continue diuresis with IV Lasix , -Wean o2 as tolerated   2. Pneumonia  - Check strep pneumo and legionella antigens, culture sputum -continue empiric azithro and rocephin    3. Hemoptysis  - CTA chest negative for PE but concerning for pulmonary edema and multifocal pneumonia  - Holding ASA and pharmacologic VTE ppx for now -cont above tx for now   4. OHS/OSA  - BiPAP while sleeping   5. COPD  - Not in exacerbation  - Continue ICS-LAMA-LABA and as-needed short-acting bronchodilators     6. Mediastinal and right hilar adenopathy  - Repeat imaging after treatment of pneumonia  recommended          Subjective: Still feeling sob this AM  Physical Exam: Vitals:   09/19/23 0532 09/19/23 0843 09/19/23 1209 09/19/23 1244  BP:    117/60  Pulse: 96   87  Resp: (!) 21   16  Temp:    98.5 F (36.9 C)  TempSrc:    Oral  SpO2: 93% 98% 92% 93%  Weight:      Height:       General exam: Awake, laying in bed, in nad Respiratory system: Increased respiratory effort, decreased BS Cardiovascular system: regular rate, s1, s2 Gastrointestinal system: Soft, nondistended, positive BS Central nervous system: CN2-12 grossly intact, strength intact Extremities: Perfused, no clubbing Skin: Normal skin turgor, no notable skin lesions seen Psychiatry: Mood normal // affect seems normal  Data Reviewed:  Labs reviewed: Na 134, K 3.4, Cr 0.57, WBC 8.3, Hgb 10.7, Plts 213  Family Communication: Pt in room, family at bedside  Disposition: Status is: Inpatient Remains inpatient appropriate because: severity of illness  Planned Discharge Destination: Home    Author: Garnette Pelt, MD 09/19/2023 4:51 PM  For on call review [email or web address].

## 2023-09-19 NOTE — ED Provider Notes (Signed)
 [place] EMERGENCY DEPARTMENT AT [facility] Provider Note   CSN: [id number] Arrival date & time: 09/19/23  0135     Patient presents with: Hemoptysis   Morgan Odonnell is a 73 y.o. female.   The history is provided by the patient.  Cough Cough characteristics:  Productive Sputum characteristics:  Bloody Severity:  Moderate Onset quality:  Sudden Duration:  4 hours Timing:  Constant Progression:  Unchanged Chronicity:  New Context: not weather changes and not with activity   Context comment:  Nursing home patient Relieved by:  Nothing Worsened by:  Nothing Ineffective treatments:  None tried Associated symptoms: no chest pain, no fever and no wheezing   Patient with COPD, CHF, and obesity hypoventilation syndrome presents with hemoptysis     Past Medical History:  Diagnosis Date   Allergies    Anemia    Back pain    Bilateral swelling of feet    CHF (congestive heart failure) (HCC)    Chronic cough    COPD (chronic obstructive pulmonary disease) (HCC)    Depression    GERD (gastroesophageal reflux disease)    Heart murmur    Hypertension    Hypocapnia    Hypothyroid    Knee pain    Leg edema    chronic, bilateral   Lightheaded    Low back pain    Morbid obesity (HCC)    Neck pain    Obesity hypoventilation syndrome (HCC)    Oral lesion    Shortness of breath    Shortness of breath    Shoulder pain    Snoring     Prior to Admission medications   Medication Sig Start Date End Date Taking? Authorizing Provider  albuterol  (PROVENTIL  HFA;VENTOLIN  HFA) 108 (90 BASE) MCG/ACT inhaler Inhale 2 puffs into the lungs every 6 (six) hours as needed for wheezing.     [provider]  aspirin  EC 81 MG EC tablet Take 1 tablet (81 mg total) by mouth daily. 07/05/17   Rojelio Nest, DO  budesonide -formoterol  (SYMBICORT ) 160-4.5 MCG/ACT inhaler INHALE TWO puffs into lung TWICE DAILY 07/09/22   Hunsucker, Donnice SAUNDERS, MD  Cholecalciferol (VITAMIN  D-3) 125 MCG (5000 UT) TABS Take 1 tablet by mouth daily.    [provider]  citalopram  (CELEXA ) 20 MG tablet Take 1 tablet (20 mg total) by mouth daily. 07/04/17   Rojelio Nest, DO  diclofenac  sodium (VOLTAREN ) 1 % GEL Apply 1 application topically 4 (four) times daily. Applied to knees, shoulders, wrists, and ankles    [provider]  furosemide  (LASIX ) 40 MG tablet Takes 2 tables in morning and 1 tab in evening 09/01/23   Jadine Toribio SQUIBB, MD  levothyroxine  (SYNTHROID ) 200 MCG tablet Take 1 tablet (200 mcg total) by mouth daily before breakfast. 03/24/19   Prentiss Frieze, DO  naloxone  (NARCAN ) nasal spray 4 mg/0.1 mL Place 1 spray into the nose once. 07/15/23   [provider]  nicotine  polacrilex (NICORETTE ) 4 MG gum Take 4 mg by mouth as needed for smoking cessation.    [provider]  oxyCODONE -acetaminophen  (PERCOCET/ROXICET) 5-325 MG tablet Take 1 tablet by mouth every 8 (eight) hours as needed for severe pain (pain score 7-10). 09/01/23   Jadine Toribio SQUIBB, MD  polyethylene glycol (MIRALAX  / GLYCOLAX ) 17 g packet Take 17 g by mouth daily as needed for mild constipation. 09/01/23   Jadine Toribio SQUIBB, MD  potassium chloride  (KLOR-CON ) 20 MEQ packet Take 20 mEq by mouth 2 (  two) times daily. 09/01/23   Jadine Toribio SQUIBB, MD  pregabalin  (LYRICA ) 75 MG capsule Take 75 mg by mouth every 12 (twelve) hours. 07/15/23   [provider]  propranolol  (INDERAL ) 10 MG tablet Take 10 mg by mouth 2 (two) times daily as needed. 07/04/23   [provider]  RELISTOR 150 MG TABS Take 3 tablets by mouth daily. 03/21/23   [provider]  Spacer/Aero-Holding Chambers (AEROCHAMBER Z-STAT PLUS [place]) MISC Use as directed 05/10/15   Nestor, Jennings E, MD  tiotropium (SPIRIVA  HANDIHALER) 18 MCG inhalation capsule Place 1 capsule (18 mcg total) into inhaler and inhale daily. 07/09/22   Hunsucker, Donnice SAUNDERS, MD  urea  (URE-NA) 15 g PACK oral packet Take 15 g by  mouth 2 (two) times daily. 09/01/23   Jadine Toribio SQUIBB, MD    Allergies: Gabapentin and Novocain [procaine]    Review of Systems  Constitutional:  Negative for fever.  HENT:  Negative for facial swelling.   Respiratory:  Positive for cough. Negative for wheezing and stridor.   Cardiovascular:  Negative for chest pain.  All other systems reviewed and are negative.   Updated Vital Signs BP 133/61   Pulse 91   Temp 98.2 F (36.8 C) (Oral)   Resp 17   Ht 5' 2 (1.575 m)   SpO2 94%   BMI 54.72 kg/m   Physical Exam Vitals and nursing note reviewed.  Constitutional:      General: She is not in acute distress.    Appearance: Normal appearance. She is well-developed.  HENT:     Head: Normocephalic and atraumatic.     Nose: Nose normal.  Eyes:     Pupils: Pupils are equal, round, and reactive to light.  Cardiovascular:     Rate and Rhythm: Normal rate and regular rhythm.     Pulses: Normal pulses.     Heart sounds: Normal heart sounds.  Pulmonary:     Effort: Pulmonary effort is normal. No respiratory distress.     Breath sounds: Rhonchi present.  Abdominal:     General: Bowel sounds are normal. There is no distension.     Palpations: Abdomen is soft.     Tenderness: There is no abdominal tenderness. There is no guarding or rebound.  Musculoskeletal:        General: Normal range of motion.     Cervical back: Neck supple.  Skin:    General: Skin is dry.     Capillary Refill: Capillary refill takes less than 2 seconds.     Findings: No erythema or rash.  Neurological:     General: No focal deficit present.     Mental Status: She is alert.     Deep Tendon Reflexes: Reflexes normal.  Psychiatric:        Mood and Affect: Mood normal.     (all labs ordered are listed, but only abnormal results are displayed) Results for orders placed or performed during the hospital encounter of 09/19/23  CBC with Differential   Collection Time: 09/19/23  2:30 AM  Result Value Ref  Range   WBC 8.6 4.0 - 10.5 K/uL   RBC 3.92 3.87 - 5.11 MIL/uL   Hemoglobin 11.0 (L) 12.0 - 15.0 g/dL   HCT 63.1 63.9 - 53.9 %   MCV 93.9 80.0 - 100.0 fL   MCH 28.1 26.0 - 34.0 pg   MCHC 29.9 (L) 30.0 - 36.0 g/dL   RDW 81.3 (H) 88.4 - 84.4 %   Platelets  219 150 - 400 K/uL   nRBC 0.0 0.0 - 0.2 %   Neutrophils Relative % 61 %   Neutro Abs 5.2 1.7 - 7.7 K/uL   Lymphocytes Relative 25 %   Lymphs Abs 2.2 0.7 - 4.0 K/uL   Monocytes Relative 9 %   Monocytes Absolute 0.8 0.1 - 1.0 K/uL   Eosinophils Relative 3 %   Eosinophils Absolute 0.3 0.0 - 0.5 K/uL   Basophils Relative 1 %   Basophils Absolute 0.1 0.0 - 0.1 K/uL   Immature Granulocytes 1 %   Abs Immature Granulocytes 0.04 0.00 - 0.07 K/uL  Comprehensive metabolic panel   Collection Time: 09/19/23  2:30 AM  Result Value Ref Range   Sodium 134 (L) 135 - 145 mmol/L   Potassium 3.8 3.5 - 5.1 mmol/L   Chloride 93 (L) 98 - 111 mmol/L   CO2 33 (H) 22 - 32 mmol/L   Glucose, Bld 93 70 - 99 mg/dL   BUN 9 8 - 23 mg/dL   Creatinine, Ser 9.41 0.44 - 1.00 mg/dL   Calcium  8.5 (L) 8.9 - 10.3 mg/dL   Total Protein 7.9 6.5 - 8.1 g/dL   Albumin 2.2 (L) 3.5 - 5.0 g/dL   AST 40 15 - 41 U/L   ALT 15 0 - 44 U/L   Alkaline Phosphatase 89 38 - 126 U/L   Total Bilirubin 1.5 (H) 0.0 - 1.2 mg/dL   GFR, Estimated >39 >39 mL/min   Anion gap 8 5 - 15  Type and screen [facility]   Collection Time: 09/19/23  2:30 AM  Result Value Ref Range   ABO/RH(D) O POS    Antibody Screen NEG    Sample Expiration      09/22/2023,2359 Performed at [facility], [address] [place], [place] [address]   I-stat chem 8, ED (not at [facility], [facility] or [facility])   Collection Time: 09/19/23  2:31 AM  Result Value Ref Range   Sodium 137 135 - 145 mmol/L   Potassium 3.9 3.5 - 5.1 mmol/L   Chloride 92 (L) 98 - 111 mmol/L   BUN 9 8 - 23 mg/dL   Creatinine, Ser 9.29 0.44 - 1.00 mg/dL   Glucose, Bld 90 70 - 99 mg/dL   Calcium , Ion 1.16  1.15 - 1.40 mmol/L   TCO2 39 (H) 22 - 32 mmol/L   Hemoglobin 12.9 12.0 - 15.0 g/dL   HCT 61.9 63.9 - 53.9 %   CT Angio Chest PE W and/or Wo Contrast Result Date: 09/19/2023 CLINICAL DATA:  The EXAM: CT ANGIOGRAPHY CHEST WITH CONTRAST TECHNIQUE: Multidetector CT imaging of the chest was performed using the standard protocol during bolus administration of intravenous contrast. Multiplanar CT image reconstructions and MIPs were obtained to evaluate the vascular anatomy. RADIATION DOSE REDUCTION: This exam was performed according to the departmental dose-optimization program which includes automated exposure control, adjustment of the mA and/or kV according to patient size and/or use of iterative reconstruction technique. CONTRAST:  OMNIPAQUE  IOHEXOL  350 MG/ML SOLN COMPARISON:  Portable chest 08/25/2023, portable chest 08/15/2023, AP Lat chest 09/28/2019, older chest CT with IV contrast 10/29/2007. FINDINGS: Cardiovascular: There is diagnostic arterial opacification. No evidence of arterial embolus. The pulmonary arteries and trunk are prominent consistent with arterial hypertension. The pulmonary trunk is 3.5 cm. There is moderate cardiomegaly with prominence of the right chambers consistent with right heart dysfunction possibly chronic, with IVC reflux. There is no pericardial effusion. There are scattered  three-vessel coronary calcifications and a heavily calcified mitral ring. The central pulmonary veins are slightly prominent. There are scattered calcific plaques in the aorta and great vessels, mild tortuosity, without aneurysm, stenosis or dissection. There are scattered calcifications and slight thickening of the aortic valve leaflets. Mediastinum/Nodes: There is new multifocal mediastinal adenopathy. For example there is a left prevascular space node 1.4 cm in short axis on 4:29, right paratracheal adenopathy, largest of these measuring 1.7 cm on 4:34, and AP window lymph node is 1.3 cm on 4:34, and  a subcarinal lymph node is 1.4 cm on 4:57. In the hila, there are enlarged lymph nodes on the right the largest of which is 1.6 cm on 4:50, single slightly prominent left hilar lymph node 1.1 cm on 4:33. There is no bulky or encasing adenopathy or mass effect associated. This could be reactive adenopathy. Axillary spaces are clear. Thyroid  gland is not seen and could be above the plane of imaging or atrophic in the interval. Thoracic trachea and main bronchi are patent. Thoracic esophagus unremarkable. Small hiatal hernia. Mildly prominent right paraesophageal lymph nodes in the lower mediastinum are also seen. Lungs/Pleura: There are symmetric small layering pleural effusions. There is mild interlobular septal thickening in the lung bases consistent with mild edema. Patchy consolidation noted in the dorsal inferior lingula and in left lower lobe consistent with pneumonia or aspiration intermixed with coarsely reticular atelectatic change. There is additional increased opacity in the anterior segment of the right upper lobe likely additional focal infiltrate. There is diffuse bronchial thickening which could be congestive or bronchitic. Respiratory motion is noted with no other focal lung opacities allowing for motion. No pneumothorax. Upper Abdomen: Moderate hepatic steatosis.  Remote cholecystectomy. There appears to be a mildly cirrhotic liver configuration, capsular nodularity in the left lobe. No splenomegaly or ascites. No acute findings. Musculoskeletal: Degenerative changes and mild kyphodextroscoliosis thoracic spine. No acute or other significant osseous findings or focal pathologic process. Unremarkable visualized chest wall. Review of the MIP images confirms the above findings. IMPRESSION: 1. No evidence of arterial embolus. 2. Prominent pulmonary arteries and trunk consistent with arterial hypertension. 3. Moderate cardiomegaly with prominence of the right chambers consistent with right heart  dysfunction possibly chronic, with IVC reflux. 4. Aortic and coronary artery atherosclerosis. 5. Small pleural effusions with mild interlobular septal thickening in the lung bases consistent with mild edema. 6. Patchy consolidation in the dorsal lingula and left lower lobe consistent with pneumonia or aspiration intermixed with coarsely reticular atelectatic change. 7. Additional focal opacity in the anterior segment of the right upper lobe likely additional focal infiltrate. 8. Diffuse bronchial thickening which could be congestive or bronchitic. 9. New multifocal mediastinal and right hilar adenopathy. This could be reactive. Follow-up CT recommended after treatment. 10. Hepatic steatosis with mildly cirrhotic liver configuration. No splenomegaly or ascites. 11. Small hiatal hernia. Aortic Atherosclerosis (ICD10-I70.0). Electronically Signed   By: Francis Quam M.D.   On: 09/19/2023 03:35   DG Chest Portable 1 View Result Date: 09/19/2023 CLINICAL DATA:  Hemoptysis, shortness of breath EXAM: PORTABLE CHEST 1 VIEW COMPARISON:  08/25/2023 FINDINGS: Cardiomegaly, vascular congestion. Diffuse interstitial prominence and bilateral lower lobe airspace opacities, likely edema. No effusions. No acute bony abnormality. IMPRESSION: Cardiomegaly with vascular congestion and mild to moderate pulmonary edema. Electronically Signed   By: Franky Crease M.D.   On: 09/19/2023 02:58   DG Swallowing Func-Speech Pathology Result Date: 08/27/2023 Table formatting from the original result was not included. Modified Barium Swallow Study  Patient Details Name: BLONDELL LAPERLE MRN: [id number] Date of Birth: Nov 20, 1950 Today's Date: 08/27/2023 HPI/PMH: HPI: 43 YOF w/ hx of OHS/OSA, CHRF on 3 L O2, restrictive lung disease secondary to obesity, morbid obesity, HFpEF, hypothyroidism, mood disorder, chronic pain presented with concern for increased somnolence at home. At time of interview patient is awake alert and fully oriented. She  reports that her husband has been more concerned because over the past few days she has been sleeping more often.She reports that she has had worsening shortness of breath, chest pressure at rest, cough productive with white sputum over the past week. No fever, chills. She has chronic lymphedema thinks that her legs have become more swollen. She has chronic orthopnea. She has previously been intolerant of noninvasive ventilation with sleep and does not use at home. Workup in the ED were notable for labs showed BNP 821 bicarb 31 hemoglobin 9 previously 13 g, chest x-ray with basilar atelectasis RT>LT. Blood gases pH 7.4 PCO278 pO2 less than 30 compensated picture Patient admitted for increased somnolence with acute on chronic hypoxic and hypercapnic respiratory failure per MD note. Clinical Impression: Clinical Impression: Patient presents with moderate oral and minimal pharyngeal dysphagia without aspiration of any consistency tested. She demonstrates significant oral control impairments with lingual pumping, prolonged transiting with oral retention post-swallow without consistent awareness.  Retention also adhered to hard palate - . Use of pudding helpful to transit masticated solids into pharynx.   In addition, base of tongue retention noted without awareness.  Laryngeal penetration of thin liquid occured secondary to poor oral control with premature spillage into pharynx and decreased laryngeal closure - and this occured largely consistently.  Chin tuck continued to allow penetration but appeared less so and if pt finds this strategy helpful, it is advised she utlize this with liquids. Barium tablet taken with pudding easily transited through oropharynx and appeared to clear esophagus without delay.  No aspiration observed and pt did cough x1 during testing. Factors that may increase risk of adverse event in presence of aspiration Noe & Lianne 2021): Factors that may increase risk of adverse event in  presence of aspiration Noe & Lianne 2021): Frail or deconditioned; Dependence for feeding and/or oral hygiene; Poor general health and/or compromised immunity; Reduced cognitive function Recommendations/Plan: Swallowing Evaluation Recommendations Swallowing Evaluation Recommendations Recommendations: PO diet PO Diet Recommendation: Dysphagia 3 (Mechanical soft); Thin liquids (Level 0) Liquid Administration via: Straw; Cup Medication Administration: Other (Comment) (as tolerated) Supervision: Staff to assist with self-feeding Swallowing strategies  : Slow rate; Small bites/sips; Chin tuck (use puree to aid oral clearance of solid particles) Postural changes: Stay upright 30-60 min after meals; Out of bed for meals Oral care recommendations: Oral care QID (4x/day) Treatment Plan Treatment Plan Treatment recommendations: Therapy as outlined in treatment plan below Functional status assessment: Patient has had a recent decline in their functional status and demonstrates the ability to make significant improvements in function in a reasonable and predictable amount of time. Treatment frequency: Min 1x/week Treatment duration: 2 weeks Interventions: Aspiration precaution training; Respiratory muscle strength training; Compensatory techniques; Patient/family education; Diet toleration management by SLP Recommendations Recommendations for follow up therapy are one component of a multi-disciplinary discharge planning process, led by the attending physician.  Recommendations may be updated based on patient status, additional functional criteria and insurance authorization. Assessment: Orofacial Exam: Orofacial Exam Oral Cavity: Oral Hygiene: WFL Oral Cavity - Dentition: Missing dentition Orofacial Anatomy: WFL Oral Motor/Sensory Function: Generalized oral weakness Anatomy: Anatomy: WFL Boluses  Administered: Boluses Administered Boluses Administered: Thin liquids (Level 0); Mildly thick liquids (Level 2, nectar thick);  Puree; Solid  Oral Impairment Domain: Oral Impairment Domain Lip Closure: Escape from interlabial space or lateral juncture, no extension beyond vermillion border Tongue control during bolus hold: Escape to lateral buccal cavity/floor of mouth Bolus preparation/mastication: Slow prolonged chewing/mashing with complete recollection Bolus transport/lingual motion: Repetitive/disorganized tongue motion Oral residue: Residue collection on oral structures Location of oral residue : Tongue Initiation of pharyngeal swallow : Pyriform sinuses; Posterior laryngeal surface of the epiglottis  Pharyngeal Impairment Domain: Pharyngeal Impairment Domain Soft palate elevation: No bolus between soft palate (SP)/pharyngeal wall (PW) Laryngeal elevation: Partial superior movement of thyroid  cartilage/partial approximation of arytenoids to epiglottic petiole Anterior hyoid excursion: Partial anterior movement Epiglottic movement: Complete inversion Laryngeal vestibule closure: Incomplete, narrow column air/contrast in laryngeal vestibule Pharyngeal stripping wave : Present - diminished Pharyngeal contraction (A/P view only): N/A Pharyngoesophageal segment opening: Partial distention/partial duration, partial obstruction of flow Tongue base retraction: Trace column of contrast or air between tongue base and PPW Pharyngeal residue: Collection of residue within or on pharyngeal structures Location of pharyngeal residue: Valleculae  Esophageal Impairment Domain: Esophageal Impairment Domain Esophageal clearance upright position: Complete clearance, esophageal coating Pill: Pill Consistency administered: Puree Penetration/Aspiration Scale Score: Penetration/Aspiration Scale Score 1.  Material does not enter airway: Mildly thick liquids (Level 2, nectar thick); Moderately thick liquids (Level 3, honey thick); Puree; Solid; Pill 2.  Material enters airway, remains ABOVE vocal cords then ejected out: Thin liquids (Level 0) Compensatory  Strategies: Compensatory Strategies Compensatory strategies: Yes Other(comment): Ineffective (count 1,2,3.. swallow; chin tuck, cue to organize and swallow, reflexive dry swallows weak and ineffective)   General Information: Caregiver present: Yes  Diet Prior to this Study: Regular; Thin liquids (Level 0)   Temperature : Normal   Respiratory Status: WFL   Supplemental O2: Nasal cannula   History of Recent Intubation: No  Behavior/Cognition: Alert; Cooperative; Distractible Self-Feeding Abilities: Dependent for feeding Baseline vocal quality/speech: Hypophonia/low volume Volitional Cough: Unable to elicit Volitional Swallow: Unable to elicit (did not conduct consistently) Exam Limitations: Limited visibility; Fatigue Goal Planning: Prognosis for improved oropharyngeal function: Fair Barriers to Reach Goals: Time post onset; Overall medical prognosis; Cognitive deficits No data recorded Patient/Family Stated Goal: none stated, spouse present Consulted and agree with results and recommendations: Patient; Family member/caregiver Pain: Pain Assessment Pain Assessment: No/denies pain Faces Pain Scale: 4 Breathing: 0 Negative Vocalization: 0 Facial Expression: 0 Body Language: 0 Consolability: 0 PAINAD Score: 0 Pain Location: B LE with bed mobility Pain Descriptors / Indicators: Grimacing Pain Intervention(s): Limited activity within patient's tolerance; Monitored during session End of Session: Start Time:SLP Start Time (ACUTE ONLY): 1201 Stop Time: SLP Stop Time (ACUTE ONLY): 1231 Time Calculation:SLP Time Calculation (min) (ACUTE ONLY): 30 min Charges: SLP Evaluations $ SLP Speech Visit: 1 Visit SLP Evaluations $BSS Swallow: 1 Procedure $MBS Swallow: 1 Procedure $Swallowing Treatment: 1 Procedure SLP visit diagnosis: SLP Visit Diagnosis: Dysphagia, unspecified (R13.10) Past Medical History: Past Medical History: Diagnosis Date  Allergies   Anemia   Back pain   Bilateral swelling of feet   CHF (congestive heart failure)  (HCC)   Chronic cough   COPD (chronic obstructive pulmonary disease) (HCC)   Depression   GERD (gastroesophageal reflux disease)   Heart murmur   Hypertension   Hypocapnia   Hypothyroid   Knee pain   Leg edema   chronic, bilateral  Lightheaded   Low back pain   Morbid obesity (HCC)  Neck pain   Obesity hypoventilation syndrome (HCC)   Oral lesion   Shortness of breath   Shortness of breath   Shoulder pain   Snoring  Past Surgical History: Past Surgical History: Procedure Laterality Date  APPENDECTOMY    CHOLECYSTECTOMY    COLONOSCOPY N/A 06/25/2013  Procedure: COLONOSCOPY;  Surgeon: Belvie JONETTA Just, MD;  Location: [facility] ENDOSCOPY;  Service: Endoscopy;  Laterality: N/A;  ECTOPIC PREGNANCY SURGERY    Left Ovary Removed    MENISCUS REPAIR    TONSILLECTOMY    TUBAL LIGATION   [name], MS [facility] SLP Acute Rehab Services Office [phone] [name] 08/27/2023, 2:18 PM  DG CHEST PORT 1 VIEW Result Date: 08/25/2023 CLINICAL DATA:  Respiratory failure EXAM: PORTABLE CHEST 1 VIEW COMPARISON:  August 24, 2023 FINDINGS: Persistent bilateral pulmonary interstitial infiltrates correlate with bilateral congestive changes, superimposed pneumonia is not excluded with significant opacity of the left lower lobe which correlate with collapse or consolidation. Small left pleural effusion or reaction Heart is slightly enlarged left ventricular configuration IMPRESSION: Persistent bilateral pulmonary interstitial infiltrates correlate with bilateral congestive changes, superimposed pneumonia is not excluded with significant opacity of the left lower lobe which correlate with collapse or consolidation. Electronically Signed   By: Franky Chard M.D.   On: 08/25/2023 11:42   DG CHEST PORT 1 VIEW Result Date: 08/24/2023 CLINICAL DATA:  [id number] Dyspnea [id number] EXAM: PORTABLE CHEST 1 VIEW COMPARISON:  August 15, 2023 FINDINGS: The cardiomediastinal silhouette is unchanged and enlarged in contour. Similar LEFT-sided pleural blunting. No  pneumothorax. Perihilar vascular congestion with diffuse reticular prominence and peribronchial cuffing, similar compared to prior. There is homogeneous opacification of the LEFT retrocardiac region IMPRESSION: 1. Similar appearance of pulmonary edema. 2. Homogeneous opacification of the LEFT retrocardiac region could reflect atelectasis versus infection. Electronically Signed   By: Corean Salter M.D.   On: 08/24/2023 11:41     Radiology: CT Angio Chest PE W and/or Wo Contrast Result Date: 09/19/2023 CLINICAL DATA:  The EXAM: CT ANGIOGRAPHY CHEST WITH CONTRAST TECHNIQUE: Multidetector CT imaging of the chest was performed using the standard protocol during bolus administration of intravenous contrast. Multiplanar CT image reconstructions and MIPs were obtained to evaluate the vascular anatomy. RADIATION DOSE REDUCTION: This exam was performed according to the departmental dose-optimization program which includes automated exposure control, adjustment of the mA and/or kV according to patient size and/or use of iterative reconstruction technique. CONTRAST:  OMNIPAQUE  IOHEXOL  350 MG/ML SOLN COMPARISON:  Portable chest 08/25/2023, portable chest 08/15/2023, AP Lat chest 09/28/2019, older chest CT with IV contrast 10/29/2007. FINDINGS: Cardiovascular: There is diagnostic arterial opacification. No evidence of arterial embolus. The pulmonary arteries and trunk are prominent consistent with arterial hypertension. The pulmonary trunk is 3.5 cm. There is moderate cardiomegaly with prominence of the right chambers consistent with right heart dysfunction possibly chronic, with IVC reflux. There is no pericardial effusion. There are scattered three-vessel coronary calcifications and a heavily calcified mitral ring. The central pulmonary veins are slightly prominent. There are scattered calcific plaques in the aorta and great vessels, mild tortuosity, without aneurysm, stenosis or dissection. There are scattered  calcifications and slight thickening of the aortic valve leaflets. Mediastinum/Nodes: There is new multifocal mediastinal adenopathy. For example there is a left prevascular space node 1.4 cm in short axis on 4:29, right paratracheal adenopathy, largest of these measuring 1.7 cm on 4:34, and AP window lymph node is 1.3 cm on 4:34, and a subcarinal lymph node is 1.4 cm on 4:57.  In the hila, there are enlarged lymph nodes on the right the largest of which is 1.6 cm on 4:50, single slightly prominent left hilar lymph node 1.1 cm on 4:33. There is no bulky or encasing adenopathy or mass effect associated. This could be reactive adenopathy. Axillary spaces are clear. Thyroid  gland is not seen and could be above the plane of imaging or atrophic in the interval. Thoracic trachea and main bronchi are patent. Thoracic esophagus unremarkable. Small hiatal hernia. Mildly prominent right paraesophageal lymph nodes in the lower mediastinum are also seen. Lungs/Pleura: There are symmetric small layering pleural effusions. There is mild interlobular septal thickening in the lung bases consistent with mild edema. Patchy consolidation noted in the dorsal inferior lingula and in left lower lobe consistent with pneumonia or aspiration intermixed with coarsely reticular atelectatic change. There is additional increased opacity in the anterior segment of the right upper lobe likely additional focal infiltrate. There is diffuse bronchial thickening which could be congestive or bronchitic. Respiratory motion is noted with no other focal lung opacities allowing for motion. No pneumothorax. Upper Abdomen: Moderate hepatic steatosis.  Remote cholecystectomy. There appears to be a mildly cirrhotic liver configuration, capsular nodularity in the left lobe. No splenomegaly or ascites. No acute findings. Musculoskeletal: Degenerative changes and mild kyphodextroscoliosis thoracic spine. No acute or other significant osseous findings or focal  pathologic process. Unremarkable visualized chest wall. Review of the MIP images confirms the above findings. IMPRESSION: 1. No evidence of arterial embolus. 2. Prominent pulmonary arteries and trunk consistent with arterial hypertension. 3. Moderate cardiomegaly with prominence of the right chambers consistent with right heart dysfunction possibly chronic, with IVC reflux. 4. Aortic and coronary artery atherosclerosis. 5. Small pleural effusions with mild interlobular septal thickening in the lung bases consistent with mild edema. 6. Patchy consolidation in the dorsal lingula and left lower lobe consistent with pneumonia or aspiration intermixed with coarsely reticular atelectatic change. 7. Additional focal opacity in the anterior segment of the right upper lobe likely additional focal infiltrate. 8. Diffuse bronchial thickening which could be congestive or bronchitic. 9. New multifocal mediastinal and right hilar adenopathy. This could be reactive. Follow-up CT recommended after treatment. 10. Hepatic steatosis with mildly cirrhotic liver configuration. No splenomegaly or ascites. 11. Small hiatal hernia. Aortic Atherosclerosis (ICD10-I70.0). Electronically Signed   By: Francis Quam M.D.   On: 09/19/2023 03:35   DG Chest Portable 1 View Result Date: 09/19/2023 CLINICAL DATA:  Hemoptysis, shortness of breath EXAM: PORTABLE CHEST 1 VIEW COMPARISON:  08/25/2023 FINDINGS: Cardiomegaly, vascular congestion. Diffuse interstitial prominence and bilateral lower lobe airspace opacities, likely edema. No effusions. No acute bony abnormality. IMPRESSION: Cardiomegaly with vascular congestion and mild to moderate pulmonary edema. Electronically Signed   By: Franky Crease M.D.   On: 09/19/2023 02:58     Procedures   Medications Ordered in the ED  furosemide  (LASIX ) injection 40 mg (has no administration in time range)  vancomycin  (VANCOCIN ) IVPB 1000 mg/200 mL premix (has no administration in time range)   ceFEPIme  (MAXIPIME ) 2 g in sodium chloride  0.9 % 100 mL IVPB (has no administration in time range)  iohexol  (OMNIPAQUE ) 350 MG/ML injection 100 mL (100 mLs Intravenous Contrast Given 09/19/23 0245)                                    Medical Decision Making Patient sent in by facility for a cup of hemoptysis  Amount and/or Complexity of Data Reviewed Independent Historian: EMS    Details: See above  External Data Reviewed: notes.    Details: Previous notes reviewed  Labs: ordered.    Details: Normal white count 8.6, slight low hemoglobin 11, normal platelets.  Slightly low sodium 134, normal potassium 3.8, normal creatinine 0.58 Radiology: ordered and independent interpretation performed.    Details: PNA by me on CTA ECG/medicine tests: ordered and independent interpretation performed. Decision-making details documented in ED Course.  Risk Prescription drug management. Decision regarding hospitalization.     Final diagnoses:  HCAP (healthcare-associated pneumonia)  Acute congestive heart failure, unspecified heart failure type (HCC)  Hemoptysis  Pleural effusion   The patient appears reasonably stabilized for admission considering the current resources, flow, and capabilities available in the ED at this time, and I doubt any other [facility] requiring further screening and/or treatment in the ED prior to admission.  ED Discharge Orders     None          Akshara Blumenthal, MD 09/19/23 [phone]

## 2023-09-19 NOTE — Hospital Course (Signed)
 73 y.o. female with medical history significant for COPD, OHS, OSA, chronic HFpEF, chronic hypoxic and hypercarbic respiratory failure, and hypothyroidism who presents with hemoptysis.   Patient was at her SNF and scheduled to return home today but developed hemoptysis last night that prompted her presentation to the ED.  Patient states that she began coughing overnight and was spitting up red blood.  She denies any associated chest pain.  She denies experiencing this previously.  She has not noticed any fever or chills.   ED Course: Upon arrival to the ED, patient is found to be afebrile and saturating mid 90s on 15 L/min of supplemental oxygen  with normal RR, normal HR, and stable BP.  Labs are most notable for normal creatinine, serum bicarbonate 33, albumin 2.2, and normal WBC.  CTA chest is negative for PE but notable for small pleural effusion, mild pulmonary edema, multifocal pneumonia, and new mediastinal and right hilar adenopathy.   Blood cultures were ordered and the patient was given 40 mg IV Lasix  and antibiotics.

## 2023-09-19 NOTE — Progress Notes (Signed)
 Heart Failure Navigator Progress Note  Assessed for Heart & Vascular TOC clinic readiness.  Patient does not meet criteria due to EF 50-55%, admitted for Hemoptysis. has a follow up appointment on 09/29/2023. No HF TOC. .   Navigator will sign off at this time.   Stephane Haddock, BSN, Scientist, clinical (histocompatibility and immunogenetics) Only

## 2023-09-19 NOTE — H&P (Signed)
 History and Physical    MARQUESA RATH FMW:[id number] DOB: 1950-12-20 DOA: 09/19/2023  PCP: Pcp, No   Patient coming from: SNF   Chief Complaint: Hemoptysis   HPI: Morgan Odonnell is a 73 y.o. female with medical history significant for COPD, OHS, OSA, chronic HFpEF, chronic hypoxic and hypercarbic respiratory failure, and hypothyroidism who presents with hemoptysis.  Patient was at her SNF and scheduled to return home today but developed hemoptysis last night that prompted her presentation to the ED.  Patient states that she began coughing overnight and was spitting up red blood.  She denies any associated chest pain.  She denies experiencing this previously.  She has not noticed any fever or chills.  ED Course: Upon arrival to the ED, patient is found to be afebrile and saturating mid 90s on 15 L/min of supplemental oxygen  with normal RR, normal HR, and stable BP.  Labs are most notable for normal creatinine, serum bicarbonate 33, albumin 2.2, and normal WBC.  CTA chest is negative for PE but notable for small pleural effusion, mild pulmonary edema, multifocal pneumonia, and new mediastinal and right hilar adenopathy.  Blood cultures were ordered and the patient was given 40 mg IV Lasix  and antibiotics.  Review of Systems:  All other systems reviewed and apart from HPI, are negative.  Past Medical History:  Diagnosis Date   Allergies    Anemia    Back pain    Bilateral swelling of feet    CHF (congestive heart failure) (HCC)    Chronic cough    COPD (chronic obstructive pulmonary disease) (HCC)    Depression    GERD (gastroesophageal reflux disease)    Heart murmur    Hypertension    Hypocapnia    Hypothyroid    Knee pain    Leg edema    chronic, bilateral   Lightheaded    Low back pain    Morbid obesity (HCC)    Neck pain    Obesity hypoventilation syndrome (HCC)    Oral lesion    Shortness of breath    Shortness of breath    Shoulder pain    Snoring      Past Surgical History:  Procedure Laterality Date   APPENDECTOMY     CHOLECYSTECTOMY     COLONOSCOPY N/A 06/25/2013   Procedure: COLONOSCOPY;  Surgeon: Belvie JONETTA Just, MD;  Location: [facility];  Service: Endoscopy;  Laterality: N/A;   ECTOPIC PREGNANCY SURGERY     Left Ovary Removed     MENISCUS REPAIR     TONSILLECTOMY     TUBAL LIGATION      Social History:   reports that she quit smoking about 8 years ago. Her smoking use included cigarettes. She started smoking about 53 years ago. She has a 90 pack-year smoking history. She has never used smokeless tobacco. She reports that she does not drink alcohol and does not use drugs.  Allergies  Allergen Reactions   Gabapentin     slept for 2 days   Novocain [Procaine] Other (See Comments)    intolerance    Family History  Problem Relation Age of Onset   Ovarian cancer Mother    Lung disease Mother    Heart failure Mother    Hypertension Mother    Heart disease Mother    Seizures Mother    Cancer Mother    Depression Mother    Sleep apnea Mother    Alcoholism Mother    Eating disorder Mother  Obesity Mother    Hypertension Father    Eating disorder Father    Obesity Father    Anxiety disorder Father    Lymphoma Brother    Ovarian cancer Maternal Aunt    Asthma Daughter      Prior to Admission medications   Medication Sig Start Date End Date Taking? Authorizing Provider  albuterol  (PROVENTIL  HFA;VENTOLIN  HFA) 108 (90 BASE) MCG/ACT inhaler Inhale 2 puffs into the lungs every 6 (six) hours as needed for wheezing.     [provider]  aspirin  EC 81 MG EC tablet Take 1 tablet (81 mg total) by mouth daily. 07/05/17   Rojelio Nest, DO  budesonide -formoterol  (SYMBICORT ) 160-4.5 MCG/ACT inhaler INHALE TWO puffs into lung TWICE DAILY 07/09/22   Hunsucker, Donnice SAUNDERS, MD  Cholecalciferol (VITAMIN D -3) 125 MCG (5000 UT) TABS Take 1 tablet by mouth daily.    [provider]  citalopram  (CELEXA ) 20 MG  tablet Take 1 tablet (20 mg total) by mouth daily. 07/04/17   Rojelio Nest, DO  diclofenac  sodium (VOLTAREN ) 1 % GEL Apply 1 application topically 4 (four) times daily. Applied to knees, shoulders, wrists, and ankles    [provider]  furosemide  (LASIX ) 40 MG tablet Takes 2 tables in morning and 1 tab in evening 09/01/23   Jadine Toribio SQUIBB, MD  levothyroxine  (SYNTHROID ) 200 MCG tablet Take 1 tablet (200 mcg total) by mouth daily before breakfast. 03/24/19   Prentiss Frieze, DO  naloxone  (NARCAN ) nasal spray 4 mg/0.1 mL Place 1 spray into the nose once. 07/15/23   [provider]  nicotine  polacrilex (NICORETTE ) 4 MG gum Take 4 mg by mouth as needed for smoking cessation.    [provider]  oxyCODONE -acetaminophen  (PERCOCET/ROXICET) 5-325 MG tablet Take 1 tablet by mouth every 8 (eight) hours as needed for severe pain (pain score 7-10). 09/01/23   Jadine Toribio SQUIBB, MD  polyethylene glycol (MIRALAX  / GLYCOLAX ) 17 g packet Take 17 g by mouth daily as needed for mild constipation. 09/01/23   Jadine Toribio SQUIBB, MD  potassium chloride  (KLOR-CON ) 20 MEQ packet Take 20 mEq by mouth 2 (two) times daily. 09/01/23   Jadine Toribio SQUIBB, MD  pregabalin  (LYRICA ) 75 MG capsule Take 75 mg by mouth every 12 (twelve) hours. 07/15/23   [provider]  propranolol  (INDERAL ) 10 MG tablet Take 10 mg by mouth 2 (two) times daily as needed. 07/04/23   [provider]  RELISTOR 150 MG TABS Take 3 tablets by mouth daily. 03/21/23   [provider]  Spacer/Aero-Holding Chambers (AEROCHAMBER Z-STAT PLUS [place]) MISC Use as directed 05/10/15   Nestor, Jennings E, MD  tiotropium (SPIRIVA  HANDIHALER) 18 MCG inhalation capsule Place 1 capsule (18 mcg total) into inhaler and inhale daily. 07/09/22   Hunsucker, Donnice SAUNDERS, MD  urea  (URE-NA) 15 g PACK oral packet Take 15 g by mouth 2 (two) times daily. 09/01/23   Jadine Toribio SQUIBB, MD    Physical Exam: Vitals:   09/19/23 0155 09/19/23  0300 09/19/23 0305 09/19/23 0330  BP:  130/61  133/61  Pulse:  96 92 91  Resp:  14 18 17   Temp:      TempSrc:      SpO2:  91% 96% 94%  Height: 5' 2 (1.575 m)       Constitutional: NAD, no pallor or diaphoresis  Eyes: PERTLA, lids and conjunctivae normal ENMT: Mucous membranes are moist. Posterior pharynx clear of any exudate or lesions.   Neck: supple, no  masses  Respiratory: Prolonged expiratory phase. Scattered Rhonchi. Dyspneic with speech.  Cardiovascular: S1 & S2 heard, regular rate and rhythm. Bilateral lower extremity edema.   Abdomen: No tenderness, soft. Bowel sounds active.  Musculoskeletal: no clubbing / cyanosis. No joint deformity upper and lower extremities.   Skin: Extensive ecchymoses involving UEs. Warm, dry, well-perfused. Neurologic: CN 2-12 grossly intact. Moving all extremities. Alert and oriented.  Psychiatric: Calm. Cooperative.    Labs and Imaging on Admission: I have personally reviewed following labs and imaging studies  CBC: Recent Labs  Lab 09/19/23 0230 09/19/23 0231  WBC 8.6  --   NEUTROABS 5.2  --   HGB 11.0* 12.9  HCT 36.8 38.0  MCV 93.9  --   PLT 219  --    Basic Metabolic Panel: Recent Labs  Lab 09/19/23 0230 09/19/23 0231  NA 134* 137  K 3.8 3.9  CL 93* 92*  CO2 33*  --   GLUCOSE 93 90  BUN 9 9  CREATININE 0.58 0.70  CALCIUM  8.5*  --    GFR: CrCl cannot be calculated (Unknown ideal weight.). Liver Function Tests: Recent Labs  Lab 09/19/23 0230  AST 40  ALT 15  ALKPHOS 89  BILITOT 1.5*  PROT 7.9  ALBUMIN 2.2*   No results for input(s): LIPASE, AMYLASE in the last 168 hours. No results for input(s): AMMONIA in the last 168 hours. Coagulation Profile: No results for input(s): INR, PROTIME in the last 168 hours. Cardiac Enzymes: No results for input(s): CKTOTAL, CKMB, CKMBINDEX, TROPONINI in the last 168 hours. BNP (last 3 results) No results for input(s): PROBNP in the last 8760  hours. HbA1C: No results for input(s): HGBA1C in the last 72 hours. CBG: No results for input(s): GLUCAP in the last 168 hours. Lipid Profile: No results for input(s): CHOL, HDL, LDLCALC, TRIG, CHOLHDL, LDLDIRECT in the last 72 hours. Thyroid  Function Tests: No results for input(s): TSH, T4TOTAL, FREET4, T3FREE, THYROIDAB in the last 72 hours. Anemia Panel: No results for input(s): VITAMINB12, FOLATE, FERRITIN, TIBC, IRON, RETICCTPCT in the last 72 hours. Urine analysis:    Component Value Date/Time   COLORURINE YELLOW 08/27/2023 1933   APPEARANCEUR CLEAR 08/27/2023 1933   LABSPEC 1.015 08/27/2023 1933   PHURINE 6.0 08/27/2023 1933   GLUCOSEU NEGATIVE 08/27/2023 1933   HGBUR SMALL (A) 08/27/2023 1933   BILIRUBINUR NEGATIVE 08/27/2023 1933   KETONESUR NEGATIVE 08/27/2023 1933   PROTEINUR NEGATIVE 08/27/2023 1933   UROBILINOGEN 0.2 04/01/2007 1526   NITRITE NEGATIVE 08/27/2023 1933   LEUKOCYTESUR NEGATIVE 08/27/2023 1933   Sepsis Labs: @LABRCNTIP (procalcitonin:4,lacticidven:4) )No results found for this or any previous visit (from the past 240 hours).   Radiological Exams on Admission: CT Angio Chest PE W and/or Wo Contrast Result Date: 09/19/2023 CLINICAL DATA:  The EXAM: CT ANGIOGRAPHY CHEST WITH CONTRAST TECHNIQUE: Multidetector CT imaging of the chest was performed using the standard protocol during bolus administration of intravenous contrast. Multiplanar CT image reconstructions and MIPs were obtained to evaluate the vascular anatomy. RADIATION DOSE REDUCTION: This exam was performed according to the departmental dose-optimization program which includes automated exposure control, adjustment of the mA and/or kV according to patient size and/or use of iterative reconstruction technique. CONTRAST:  OMNIPAQUE  IOHEXOL  350 MG/ML SOLN COMPARISON:  Portable chest 08/25/2023, portable chest 08/15/2023, AP Lat chest 09/28/2019, older chest CT  with IV contrast 10/29/2007. FINDINGS: Cardiovascular: There is diagnostic arterial opacification. No evidence of arterial embolus. The pulmonary arteries and trunk are prominent consistent with arterial hypertension.  The pulmonary trunk is 3.5 cm. There is moderate cardiomegaly with prominence of the right chambers consistent with right heart dysfunction possibly chronic, with IVC reflux. There is no pericardial effusion. There are scattered three-vessel coronary calcifications and a heavily calcified mitral ring. The central pulmonary veins are slightly prominent. There are scattered calcific plaques in the aorta and great vessels, mild tortuosity, without aneurysm, stenosis or dissection. There are scattered calcifications and slight thickening of the aortic valve leaflets. Mediastinum/Nodes: There is new multifocal mediastinal adenopathy. For example there is a left prevascular space node 1.4 cm in short axis on 4:29, right paratracheal adenopathy, largest of these measuring 1.7 cm on 4:34, and AP window lymph node is 1.3 cm on 4:34, and a subcarinal lymph node is 1.4 cm on 4:57. In the hila, there are enlarged lymph nodes on the right the largest of which is 1.6 cm on 4:50, single slightly prominent left hilar lymph node 1.1 cm on 4:33. There is no bulky or encasing adenopathy or mass effect associated. This could be reactive adenopathy. Axillary spaces are clear. Thyroid  gland is not seen and could be above the plane of imaging or atrophic in the interval. Thoracic trachea and main bronchi are patent. Thoracic esophagus unremarkable. Small hiatal hernia. Mildly prominent right paraesophageal lymph nodes in the lower mediastinum are also seen. Lungs/Pleura: There are symmetric small layering pleural effusions. There is mild interlobular septal thickening in the lung bases consistent with mild edema. Patchy consolidation noted in the dorsal inferior lingula and in left lower lobe consistent with pneumonia or  aspiration intermixed with coarsely reticular atelectatic change. There is additional increased opacity in the anterior segment of the right upper lobe likely additional focal infiltrate. There is diffuse bronchial thickening which could be congestive or bronchitic. Respiratory motion is noted with no other focal lung opacities allowing for motion. No pneumothorax. Upper Abdomen: Moderate hepatic steatosis.  Remote cholecystectomy. There appears to be a mildly cirrhotic liver configuration, capsular nodularity in the left lobe. No splenomegaly or ascites. No acute findings. Musculoskeletal: Degenerative changes and mild kyphodextroscoliosis thoracic spine. No acute or other significant osseous findings or focal pathologic process. Unremarkable visualized chest wall. Review of the MIP images confirms the above findings. IMPRESSION: 1. No evidence of arterial embolus. 2. Prominent pulmonary arteries and trunk consistent with arterial hypertension. 3. Moderate cardiomegaly with prominence of the right chambers consistent with right heart dysfunction possibly chronic, with IVC reflux. 4. Aortic and coronary artery atherosclerosis. 5. Small pleural effusions with mild interlobular septal thickening in the lung bases consistent with mild edema. 6. Patchy consolidation in the dorsal lingula and left lower lobe consistent with pneumonia or aspiration intermixed with coarsely reticular atelectatic change. 7. Additional focal opacity in the anterior segment of the right upper lobe likely additional focal infiltrate. 8. Diffuse bronchial thickening which could be congestive or bronchitic. 9. New multifocal mediastinal and right hilar adenopathy. This could be reactive. Follow-up CT recommended after treatment. 10. Hepatic steatosis with mildly cirrhotic liver configuration. No splenomegaly or ascites. 11. Small hiatal hernia. Aortic Atherosclerosis (ICD10-I70.0). Electronically Signed   By: Francis Quam M.D.   On: 09/19/2023  03:35   DG Chest Portable 1 View Result Date: 09/19/2023 CLINICAL DATA:  Hemoptysis, shortness of breath EXAM: PORTABLE CHEST 1 VIEW COMPARISON:  08/25/2023 FINDINGS: Cardiomegaly, vascular congestion. Diffuse interstitial prominence and bilateral lower lobe airspace opacities, likely edema. No effusions. No acute bony abnormality. IMPRESSION: Cardiomegaly with vascular congestion and mild to moderate pulmonary edema. Electronically Signed  By: Franky Crease M.D.   On: 09/19/2023 02:58    EKG: Independently reviewed. Sinus rhythm, RVH.   Assessment/Plan   1. Acute on chronic HFpEF; acute on chronic hypoxic & hypercarbic respiratory failure  - Continue diuresis with IV Lasix , monitor weight and I/Os, monitor renal function and electrolytes, continue supplemental O2      2. Pneumonia  - Check strep pneumo and legionella antigens, culture sputum, treat with Rocephin  and azithromycin    3. Hemoptysis  - CTA chest negative for PE but concerning for pulmonary edema and multifocal pneumonia  - Hold ASA and pharmacologic VTE ppx, treat CHF and pneumonia, monitor     4. OHS/OSA  - BiPAP while sleeping  5. COPD  - Not in exacerbation  - Continue ICS-LAMA-LABA and as-needed short-acting bronchodilators    6. Mediastinal and right hilar adenopathy  - Repeat imaging after treatment of pneumonia recommended    DVT prophylaxis: SCDs  Code Status: DNR/DNI  Level of Care: Level of care: Progressive Family Communication: Husband at bedside  Disposition Plan:  Patient is from: SNF  Anticipated d/c is to: TBD Anticipated d/c date is: 09/22/23  Patient currently: Pending improved respiratory status, clinical stability  Consults called: None  Admission status: Inpatient     Evalene GORMAN Sprinkles, MD [facility]  09/19/2023, 4:52 AM

## 2023-09-19 NOTE — ED Triage Notes (Signed)
 Pt BIB ems for coughing up blood. Pt has a hx of nose bleeds. Denies N/V/D, not on blood thinners, no blood in stool. Hx of end stage COPD. A&Ox4. On oxygen  from home.

## 2023-09-20 DIAGNOSIS — I5033 Acute on chronic diastolic (congestive) heart failure: Secondary | ICD-10-CM | POA: Diagnosis not present

## 2023-09-20 LAB — BLOOD CULTURE ID PANEL (REFLEXED) - BCID2

## 2023-09-20 LAB — BASIC METABOLIC PANEL WITH GFR
Anion gap: 8 (ref 5–15)
BUN: 9 mg/dL (ref 8–23)
CO2: 35 mmol/L — ABNORMAL HIGH (ref 22–32)
Calcium: 8.3 mg/dL — ABNORMAL LOW (ref 8.9–10.3)
Chloride: 93 mmol/L — ABNORMAL LOW (ref 98–111)
Creatinine, Ser: 0.51 mg/dL (ref 0.44–1.00)
GFR, Estimated: >60 mL/min (ref >60)
Glucose, Bld: 90 mg/dL (ref 70–99)
Potassium: 2.7 mmol/L — CL (ref 3.5–5.1)
Sodium: 136 mmol/L (ref 135–145)

## 2023-09-20 LAB — CBC
HCT: 33.5 % — ABNORMAL LOW (ref 36.0–46.0)
Hemoglobin: 10.2 g/dL — ABNORMAL LOW (ref 12.0–15.0)
MCH: 28.6 pg (ref 26.0–34.0)
MCHC: 30.4 g/dL (ref 30.0–36.0)
MCV: 93.8 fL (ref 80.0–100.0)
Platelets: 194 K/uL (ref 150–400)
RBC: 3.57 MIL/uL — ABNORMAL LOW (ref 3.87–5.11)
RDW: 18.3 % — ABNORMAL HIGH (ref 11.5–15.5)
WBC: 7.6 K/uL (ref 4.0–10.5)
nRBC: 0 % (ref 0.0–0.2)

## 2023-09-20 LAB — MAGNESIUM: Magnesium: 1.7 mg/dL (ref 1.7–2.4)

## 2023-09-20 LAB — PROCALCITONIN: Procalcitonin: <0.1 ng/mL

## 2023-09-20 LAB — ABO/RH: ABO/RH(D): O POS

## 2023-09-20 MED ORDER — POTASSIUM CHLORIDE CRYS ER 20 MEQ PO TBCR
60.0000 meq | EXTENDED_RELEASE_TABLET | ORAL | Status: AC
  Administered 2023-09-20 (×2): 60 meq via ORAL
  Filled 2023-09-20 (×2): qty 3

## 2023-09-20 MED ORDER — SENNA 8.6 MG PO TABS
1.0000 | ORAL_TABLET | Freq: Every day | ORAL | Status: DC
  Administered 2023-09-20 – 2023-09-26 (×6): 8.6 mg via ORAL
  Filled 2023-09-20 (×6): qty 1

## 2023-09-20 MED ORDER — DOCUSATE SODIUM 100 MG PO CAPS
100.0000 mg | ORAL_CAPSULE | Freq: Two times a day (BID) | ORAL | Status: DC
  Administered 2023-09-20 – 2023-09-26 (×9): 100 mg via ORAL
  Filled 2023-09-20 (×8): qty 1

## 2023-09-20 MED ORDER — MAGNESIUM SULFATE 4 GM/100ML IV SOLN
4.0000 g | Freq: Once | INTRAVENOUS | Status: AC
  Administered 2023-09-20: 4 g via INTRAVENOUS
  Filled 2023-09-20: qty 100

## 2023-09-20 NOTE — Progress Notes (Signed)
 PHARMACY - PHYSICIAN COMMUNICATION CRITICAL VALUE ALERT - BLOOD CULTURE IDENTIFICATION (BCID)  Morgan Odonnell is an 73 y.o. female who presented to [facility] on 09/19/2023 with a chief complaint of hemoptysis   Assessment:  1/4 staph epi, MecA+  Name of physician (or Provider) Contacted: Andrez  Current antibiotics: CTX  Changes to prescribed antibiotics recommended:  None probable contaminant  Results for orders placed or performed during the hospital encounter of 09/19/23  Blood Culture ID Panel (Reflexed) (Collected: 09/19/2023  4:05 AM)  Result Value Ref Range   Enterococcus faecalis NOT DETECTED NOT DETECTED   Enterococcus Faecium NOT DETECTED NOT DETECTED   Listeria monocytogenes NOT DETECTED NOT DETECTED   Staphylococcus species DETECTED (A) NOT DETECTED   Staphylococcus aureus (BCID) NOT DETECTED NOT DETECTED   Staphylococcus epidermidis DETECTED (A) NOT DETECTED   Staphylococcus lugdunensis NOT DETECTED NOT DETECTED   Streptococcus species NOT DETECTED NOT DETECTED   Streptococcus agalactiae NOT DETECTED NOT DETECTED   Streptococcus pneumoniae NOT DETECTED NOT DETECTED   Streptococcus pyogenes NOT DETECTED NOT DETECTED   A.calcoaceticus-baumannii NOT DETECTED NOT DETECTED   Bacteroides fragilis NOT DETECTED NOT DETECTED   Enterobacterales NOT DETECTED NOT DETECTED   Enterobacter cloacae complex NOT DETECTED NOT DETECTED   Escherichia coli NOT DETECTED NOT DETECTED   Klebsiella aerogenes NOT DETECTED NOT DETECTED   Klebsiella oxytoca NOT DETECTED NOT DETECTED   Klebsiella pneumoniae NOT DETECTED NOT DETECTED   Proteus species NOT DETECTED NOT DETECTED   Salmonella species NOT DETECTED NOT DETECTED   Serratia marcescens NOT DETECTED NOT DETECTED   Haemophilus influenzae NOT DETECTED NOT DETECTED   Neisseria meningitidis NOT DETECTED NOT DETECTED   Pseudomonas aeruginosa NOT DETECTED NOT DETECTED   Stenotrophomonas maltophilia NOT DETECTED NOT DETECTED   Candida  albicans NOT DETECTED NOT DETECTED   Candida auris NOT DETECTED NOT DETECTED   Candida glabrata NOT DETECTED NOT DETECTED   Candida krusei NOT DETECTED NOT DETECTED   Candida parapsilosis NOT DETECTED NOT DETECTED   Candida tropicalis NOT DETECTED NOT DETECTED   Cryptococcus neoformans/gattii NOT DETECTED NOT DETECTED   Methicillin resistance mecA/C DETECTED (A) NOT DETECTED    Leeroy Mace RPh 09/20/2023, 5:40 AM

## 2023-09-20 NOTE — Progress Notes (Signed)
   09/20/23 2200  BiPAP/CPAP/SIPAP  BiPAP/CPAP/SIPAP Pt Type Adult  BiPAP/CPAP/SIPAP DREAMSTATIOND  Mask Type Full face mask  Dentures removed? Not applicable  Mask Size Medium  Respiratory Rate 20 breaths/min  Flow Rate 12 lpm  Patient Home Machine No  Patient Home Mask No  Patient Home Tubing No  Auto Titrate Yes  Minimum cmH2O 8 cmH2O  Maximum cmH2O 25 cmH2O  Device Plugged into RED Power Outlet Yes  BiPAP/CPAP /SiPAP Vitals  Resp 19  MEWS Score/Color  MEWS Score 0  MEWS Score Color Green

## 2023-09-20 NOTE — Progress Notes (Signed)
 Patient wore Bipap for about 45 mins and said she could not breath. RT placed patient back on 10 L salter

## 2023-09-20 NOTE — Evaluation (Signed)
 Physical Therapy Evaluation Patient Details Name: Morgan Odonnell MRN: [id number] DOB: 06-11-1950 Today's Date: 09/20/2023  History of Present Illness  Pt is 73 yo female admitted on 09/19/23 with acute on chronic CHF with resp failure and hemoptysis. CT negative for PE but concerning for pulmonary edema and PNE.   Pt with recent admission and d/c to SNF.  She was getting ready to d/c from SNF and developed hemoptysis so brought to the hospital.  Pt with hx including but not limited to COPD, OHS, OSA, chronic HFpEF, chronic hypoxic and hypercarbic respiratory failure, and hypothyroidism  Clinical Impression  Pt admitted with above diagnosis. Pt was ambulating short household distances until recent hospital admission then SNF.  Since that time -mod/max A x 2 and limited OOB activity and if OOB mostly with maxi-move.  Pt requiring max encouragement/education for activity/exercises and still self limiting.   Today, pt was able to roll with min A and cues and pulled to long sitting with supervision and HOB elevated.  Pt participated with a few exercises with encouragement but then declined further.  She was on 6 L HFNC with sats stable at rest but dropped to 82% with supine and rolling.  Pt currently with functional limitations due to the deficits listed below (see PT Problem List). Pt will benefit from acute skilled PT to increase their independence and safety with mobility to allow discharge.  Pt needing assist of 2 and below baseline - recommend Patient will benefit from continued inpatient follow up therapy, <3 hours/day ;however, guarded rehab potential pending pt's participation. Pt also expressing does not want to return to rehab - if she returns home would need max HH services, assist of 2, and hoyer lift if wanting to get OOB. Reports had hospital bed at last admission.         If plan is discharge home, recommend the following: Two people to help with walking and/or transfers;Assist for  transportation;Supervision due to cognitive status;Two people to help with bathing/dressing/bathroom   Can travel by private vehicle   No    Equipment Recommendations Deitra lift  Recommendations for Other Services       Functional Status Assessment Patient has had a recent decline in their functional status and demonstrates the ability to make significant improvements in function in a reasonable and predictable amount of time.     Precautions / Restrictions Precautions Precautions: Fall Precaution/Restrictions Comments: Fragile skin, O2 dep      Mobility  Bed Mobility Overal bed mobility: Needs Assistance Bed Mobility: Rolling Rolling: Min assist         General bed mobility comments: Pt able to roll both sides with min A when cued to flex opposite leg and reach to bed rail.   Scooting up: max x 2 , pt not assisting.  LONGSITTING: HOB elevated to 40 decrease and pt able to use bil rails to lift back off bed to long sit without assist - performed x 3. Adamantly, declined sitting EOB    Transfers                        Ambulation/Gait                  Stairs            Wheelchair Mobility     Tilt Bed    Modified Rankin (Stroke Patients Only)       Balance Overall balance assessment: Needs assistance  Sitting balance - Comments: declined                                     Pertinent Vitals/Pain Pain Assessment Pain Assessment: 0-10 Pain Score: 7  Pain Location: generalized Pain Descriptors / Indicators: Discomfort Pain Intervention(s): Limited activity within patient's tolerance, Monitored during session, RN gave pain meds during session Reports pain in bottom and back - educated on mobility to improve, getting pressure off back, improving ROM, pt mostly in bed for several weeks.     Home Living Family/patient expects to be discharged to:: Unsure Living Arrangements: Spouse/significant other Available Help at  Discharge: Family;Available 24 hours/day Type of Home: House Home Access: Ramped entrance       Home Layout: One level Home Equipment: Agricultural consultant (2 wheels);Cane - single point;Wheelchair - manual;Shower seat;Grab bars - tub/shower;Adaptive equipment;Hand held shower head;Lift chair;Hospital bed;Toilet riser Additional Comments: O2 at baseline    Prior Function Prior Level of Function : Needs assist             Mobility Comments: At last admission (~1 month ago) reports ambulating short distance with RW.  During admission pt mod/max x 2 to EOB, stood a few times, typically using maxi-move to chair.  Pt went to SNF, reports only got OOB 1 x - spouse confirmed. ADLs Comments: husband assists with bathing, dressing, toileting and all IADL's     Extremity/Trunk Assessment   Upper Extremity Assessment Upper Extremity Assessment: Generalized weakness (ROM WFL; able to reach overhead)    Lower Extremity Assessment Lower Extremity Assessment: RLE deficits/detail;LLE deficits/detail;Generalized weakness RLE Deficits / Details: ROM is functional but some limitations due to body habitus.  All testing in supine as pt declined sitting. LLE Deficits / Details: ROM is functional but some limitations due to body habitus. All testing in supine as pt declined sitting.    Cervical / Trunk Assessment Cervical / Trunk Assessment: Other exceptions;Kyphotic Cervical / Trunk Exceptions: increased body habitus  Communication        Cognition Arousal: Alert Behavior During Therapy: WFL for tasks assessed/performed   PT - Cognitive impairments: Problem solving, Sequencing, Memory, Awareness                       PT - Cognition Comments: Pt oriented to self and place.  Self-limiting - needing max encouragment. She frequently repeated self.  PT attempted earlier and spouse attempting to feed pt and now pt asking if she had lunch.  Perserverating on poor experience at rehab I called for  3 hr when coughing up blood and they didn't check on me.         Cueing       General Comments General comments (skin integrity, edema, etc.): Pt on 6 L HFNC with sats 95% rest.  Sats down to 82% when laid flat to roll.  HOB elevated, cues to breath and sats up to 90% but with increased time.  (RN NOtified)    Exercises General Exercises - Lower Extremity Ankle Circles/Pumps: AROM, Both, Supine, 20 reps Quad Sets: AROM, Both, Supine, Strengthening, 5 reps (max cues to Smash therapist hand behind knee) Heel Slides: AAROM, Both, 5 reps, Supine, Limitations Heel Slides Limitations: increased time, tends to ER Other Exercises Other Exercises: max cues and encouragement and after doing a few exercises pt stating that's enough, that's all I want.   Assessment/Plan  PT Assessment Patient needs continued PT services  PT Problem List Decreased mobility;Decreased activity tolerance;Decreased strength;Cardiopulmonary status limiting activity;Obesity;Decreased skin integrity;Pain;Decreased cognition;Decreased knowledge of use of DME;Decreased balance;Decreased safety awareness       PT Treatment Interventions DME instruction;Balance training;Functional mobility training;Therapeutic activities;Therapeutic exercise;Patient/family education;Wheelchair mobility training;Neuromuscular re-education    PT Goals (Current goals can be found in the Care Plan section)  Acute Rehab PT Goals Patient Stated Goal: not going back to rehab, I'll go hospice first. PT Goal Formulation: With patient Time For Goal Achievement: 10/04/23 Potential to Achieve Goals: Poor    Frequency Min 1X/week     Co-evaluation               AM-PAC PT 6 Clicks Mobility  Outcome Measure Help needed turning from your back to your side while in a flat bed without using bedrails?: A Little Help needed moving from lying on your back to sitting on the side of a flat bed without using bedrails?: Total Help  needed moving to and from a bed to a chair (including a wheelchair)?: Total Help needed standing up from a chair using your arms (e.g., wheelchair or bedside chair)?: Total Help needed to walk in hospital room?: Total Help needed climbing 3-5 steps with a railing? : Total 6 Click Score: 8    End of Session Equipment Utilized During Treatment: Oxygen  Activity Tolerance: Other (comment) (self limiting) Patient left: in bed;with call bell/phone within reach;with bed alarm set Nurse Communication: Mobility status;Need for lift equipment PT Visit Diagnosis: Muscle weakness (generalized) (M62.81);Difficulty in walking, not elsewhere classified (R26.2);History of falling (Z91.81)    Time: 8595-8568 PT Time Calculation (min) (ACUTE ONLY): 27 min   Charges:   PT Evaluation $PT Eval Low Complexity: 1 Low PT Treatments $Therapeutic Exercise: 8-22 mins PT General Charges $$ ACUTE PT VISIT: 1 Visit         Benjiman, PT Acute Rehab [facility] [phone]   Benjiman VEAR Mulberry 09/20/2023, 2:52 PM

## 2023-09-20 NOTE — Progress Notes (Signed)
  Progress Note   Patient: Morgan Odonnell FMW:[id number] DOB: February 11, 1951 DOA: 09/19/2023     1 DOS: the patient was seen and examined on 09/20/2023   Brief hospital course: 73 y.o. female with medical history significant for COPD, OHS, OSA, chronic HFpEF, chronic hypoxic and hypercarbic respiratory failure, and hypothyroidism who presents with hemoptysis.   Patient was at her SNF and scheduled to return home today but developed hemoptysis last night that prompted her presentation to the ED.  Patient states that she began coughing overnight and was spitting up red blood.  She denies any associated chest pain.  She denies experiencing this previously.  She has not noticed any fever or chills.   ED Course: Upon arrival to the ED, patient is found to be afebrile and saturating mid 90s on 15 L/min of supplemental oxygen  with normal RR, normal HR, and stable BP.  Labs are most notable for normal creatinine, serum bicarbonate 33, albumin 2.2, and normal WBC.  CTA chest is negative for PE but notable for small pleural effusion, mild pulmonary edema, multifocal pneumonia, and new mediastinal and right hilar adenopathy.   Blood cultures were ordered and the patient was given 40 mg IV Lasix  and antibiotics.  Assessment and Plan: 1. Acute on chronic HFpEF; acute on chronic hypoxic & hypercarbic respiratory failure  - Continue diuresis with IV Lasix , diuresing well -Will place on 1200cc fluid restriction -Wean o2 as tolerated, currently 6LNC   2. Pneumonia  - Check strep pneumo and legionella antigens, culture sputum -continue empiric azithro and rocephin    3. Hemoptysis  - CTA chest negative for PE but concerning for pulmonary edema and multifocal pneumonia  - Holding ASA and pharmacologic VTE ppx for now -cont above tx for now   4. OHS/OSA  - BiPAP while sleeping   5. COPD  - Not in exacerbation  - Continue ICS-LAMA-LABA and as-needed short-acting bronchodilators     6. Mediastinal and  right hilar adenopathy  - Repeat imaging after treatment of pneumonia recommended    7. Constipation -no significant bowel movement in several days -Likely will not tolerate PO miralax . Give trial of senna with colace -If still no results, may need enema      Subjective: Not much appetite. Not tolerating much PO  Physical Exam: Vitals:   09/20/23 0802 09/20/23 1101 09/20/23 1132 09/20/23 1405  BP:   (!) 95/39 (!) 103/58  Pulse:   88 84  Resp: 18 19 (!) 22 (!) 22  Temp:   97.9 F (36.6 C)   TempSrc:   Oral   SpO2: 91% (S) (!) 81% 93% 93%  Weight:      Height:       General exam: Conversant, in no acute distress Respiratory system: normal chest rise, clear, no audible wheezing Cardiovascular system: regular rhythm, s1-s2 Gastrointestinal system: Nondistended, nontender, pos BS Central nervous system: No seizures, no tremors Extremities: No cyanosis, no joint deformities Skin: No rashes, no pallor Psychiatry: Affect normal // no auditory hallucinations   Data Reviewed:  Labs reviewed: Na 136, K 2.7, Cr 0.51, WBC 7.6, Hgb 10.2, Plts 194  Family Communication: Pt in room, family at bedside  Disposition: Status is: Inpatient Remains inpatient appropriate because: severity of illness  Planned Discharge Destination: Skilled nursing facility    Author: Garnette Pelt, MD 09/20/2023 3:06 PM  For on call review [email or web address].

## 2023-09-20 NOTE — Progress Notes (Signed)
   09/20/23 0041  BiPAP/CPAP/SIPAP  BiPAP/CPAP/SIPAP Pt Type Adult  BiPAP/CPAP/SIPAP DREAMSTATIOND  Reason BIPAP/CPAP not in use Other(comment) (patient says she does not want to wear)  BiPAP/CPAP /SiPAP Vitals  Resp 18  MEWS Score/Color  MEWS Score 1  MEWS Score Color Green

## 2023-09-21 DIAGNOSIS — I5033 Acute on chronic diastolic (congestive) heart failure: Secondary | ICD-10-CM | POA: Diagnosis not present

## 2023-09-21 DIAGNOSIS — I509 Heart failure, unspecified: Secondary | ICD-10-CM | POA: Diagnosis not present

## 2023-09-21 DIAGNOSIS — J189 Pneumonia, unspecified organism: Secondary | ICD-10-CM | POA: Diagnosis not present

## 2023-09-21 LAB — BASIC METABOLIC PANEL WITH GFR
Anion gap: 9 (ref 5–15)
BUN: 9 mg/dL (ref 8–23)
CO2: 35 mmol/L — ABNORMAL HIGH (ref 22–32)
Calcium: 8.3 mg/dL — ABNORMAL LOW (ref 8.9–10.3)
Chloride: 91 mmol/L — ABNORMAL LOW (ref 98–111)
Creatinine, Ser: 0.52 mg/dL (ref 0.44–1.00)
GFR, Estimated: >60 mL/min (ref >60)
Glucose, Bld: 119 mg/dL — ABNORMAL HIGH (ref 70–99)
Potassium: 2.9 mmol/L — ABNORMAL LOW (ref 3.5–5.1)
Sodium: 135 mmol/L (ref 135–145)

## 2023-09-21 LAB — CBC
HCT: 33 % — ABNORMAL LOW (ref 36.0–46.0)
Hemoglobin: 10.2 g/dL — ABNORMAL LOW (ref 12.0–15.0)
MCH: 28.8 pg (ref 26.0–34.0)
MCHC: 30.9 g/dL (ref 30.0–36.0)
MCV: 93.2 fL (ref 80.0–100.0)
Platelets: 196 K/uL (ref 150–400)
RBC: 3.54 MIL/uL — ABNORMAL LOW (ref 3.87–5.11)
RDW: 18.3 % — ABNORMAL HIGH (ref 11.5–15.5)
WBC: 7.5 K/uL (ref 4.0–10.5)
nRBC: 0 % (ref 0.0–0.2)

## 2023-09-21 LAB — MAGNESIUM: Magnesium: 2.2 mg/dL (ref 1.7–2.4)

## 2023-09-21 MED ORDER — POTASSIUM CHLORIDE CRYS ER 20 MEQ PO TBCR
60.0000 meq | EXTENDED_RELEASE_TABLET | ORAL | Status: AC
  Administered 2023-09-21 (×2): 60 meq via ORAL
  Filled 2023-09-21 (×2): qty 3

## 2023-09-21 MED ORDER — MAGNESIUM CITRATE PO SOLN
1.0000 | ORAL | Status: AC
  Administered 2023-09-21: 1 via ORAL
  Filled 2023-09-21 (×2): qty 296

## 2023-09-21 NOTE — Progress Notes (Signed)
  Progress Note   Patient: Morgan Odonnell FMW:[id number] DOB: 08-23-1950 DOA: 09/19/2023     2 DOS: the patient was seen and examined on 09/21/2023   Brief hospital course: 73 y.o. female with medical history significant for COPD, OHS, OSA, chronic HFpEF, chronic hypoxic and hypercarbic respiratory failure, and hypothyroidism who presents with hemoptysis.   Patient was at her SNF and scheduled to return home today but developed hemoptysis last night that prompted her presentation to the ED.  Patient states that she began coughing overnight and was spitting up red blood.  She denies any associated chest pain.  She denies experiencing this previously.  She has not noticed any fever or chills.   ED Course: Upon arrival to the ED, patient is found to be afebrile and saturating mid 90s on 15 L/min of supplemental oxygen  with normal RR, normal HR, and stable BP.  Labs are most notable for normal creatinine, serum bicarbonate 33, albumin 2.2, and normal WBC.  CTA chest is negative for PE but notable for small pleural effusion, mild pulmonary edema, multifocal pneumonia, and new mediastinal and right hilar adenopathy.   Blood cultures were ordered and the patient was given 40 mg IV Lasix  and antibiotics.  Assessment and Plan: 1. Acute on chronic HFpEF; acute on chronic hypoxic & hypercarbic respiratory failure  - Continue diuresis with IV Lasix , continuing to diurese well -Will place on 1200cc fluid restriction -Wean o2 as tolerated, currently 6LNC   2. Pneumonia  - Check strep pneumo and legionella antigens, culture sputum -continue empiric azithro and rocephin , would treat for 5 days   3. Hemoptysis  - CTA chest negative for PE but concerning for pulmonary edema and multifocal pneumonia  - Holding ASA and pharmacologic VTE ppx for now -cont above tx for now   4. OHS/OSA  - BiPAP while sleeping   5. COPD  - Not in exacerbation  - Continue ICS-LAMA-LABA and as-needed short-acting  bronchodilators     6. Mediastinal and right hilar adenopathy  - Repeat imaging after treatment of pneumonia recommended    7. Constipation -continued on senna with colace -good results this afternoon      Subjective: When seen, was still complaining of continued constipation, asking for mg citrate. Later in afternoon, pt noted to have large BM without needing Mg citrate  Physical Exam: Vitals:   09/21/23 0505 09/21/23 0636 09/21/23 0900 09/21/23 1335  BP: (!) 106/49   (!) 122/58  Pulse: 87   91  Resp:   19   Temp: 98.7 F (37.1 C)   98.7 F (37.1 C)  TempSrc: Oral   Oral  SpO2: 96%   95%  Weight:  112.8 kg    Height:       General exam: Awake, laying in bed, in nad Respiratory system: Normal respiratory effort, no wheezing Cardiovascular system: regular rate, s1, s2 Gastrointestinal system: Soft, nondistended, positive BS Central nervous system: CN2-12 grossly intact, strength intact Extremities: Perfused, no clubbing Skin: Normal skin turgor, no notable skin lesions seen Psychiatry: Mood normal // no visual hallucinations   Data Reviewed:  Labs reviewed: Na 135, K 2.9, Cr 0.52, WBC 7.5, Hgb 10.2, Plts 196  Family Communication: Pt in room, family at bedside  Disposition: Status is: Inpatient Remains inpatient appropriate because: severity of illness  Planned Discharge Destination: Skilled nursing facility    Author: Garnette Pelt, MD 09/21/2023 4:03 PM  For on call review [email or web address].

## 2023-09-21 NOTE — Progress Notes (Signed)
   09/21/23 2240  BiPAP/CPAP/SIPAP  BiPAP/CPAP/SIPAP Pt Type Adult  Reason BIPAP/CPAP not in use Non-compliant (PT refusing)

## 2023-09-21 NOTE — Plan of Care (Signed)
   Problem: Coping: Goal: Level of anxiety will decrease Outcome: Progressing

## 2023-09-21 NOTE — Plan of Care (Signed)
 Alert and oriented. Bed bound, unable to get out of bed.  Medicated for pain, see MAR for details.  Potassium replacement today with oral potassium tablets.  New IV access obtained by IV Team RN.  Educated patient and husband at bedside on plan of care.   Problem: Education: Goal: Knowledge of General Education information will improve Description: Including pain rating scale, medication(s)/side effects and non-pharmacologic comfort measures Outcome: Progressing   Problem: Health Behavior/Discharge Planning: Goal: Ability to manage health-related needs will improve Outcome: Progressing   Problem: Clinical Measurements: Goal: Ability to maintain clinical measurements within normal limits will improve Outcome: Progressing Goal: Will remain free from infection Outcome: Progressing Goal: Diagnostic test results will improve Outcome: Progressing

## 2023-09-21 NOTE — Progress Notes (Signed)
 IV Team consult placed.  Patient with bruising and ecchymosis noted on bilateral upper extremities. No insertion sites noted for IV access.     09/21/23 0958  Unsuccessful Nursing Procedure/Treatment  Type of Nursing Procedure/Treatment Peripheral IV insertion  Number of attempts 0  Location of attempt Assessed bilateral upper extremities, no sites noted for IV access.

## 2023-09-22 DIAGNOSIS — I5033 Acute on chronic diastolic (congestive) heart failure: Secondary | ICD-10-CM | POA: Diagnosis not present

## 2023-09-22 DIAGNOSIS — I509 Heart failure, unspecified: Secondary | ICD-10-CM | POA: Diagnosis not present

## 2023-09-22 DIAGNOSIS — J189 Pneumonia, unspecified organism: Secondary | ICD-10-CM | POA: Diagnosis not present

## 2023-09-22 DIAGNOSIS — R042 Hemoptysis: Secondary | ICD-10-CM | POA: Diagnosis not present

## 2023-09-22 LAB — CBC
HCT: 37.4 % (ref 36.0–46.0)
HCT: 33.7 % — ABNORMAL LOW (ref 36.0–46.0)
Hemoglobin: 11.2 g/dL — ABNORMAL LOW (ref 12.0–15.0)
Hemoglobin: 10.1 g/dL — ABNORMAL LOW (ref 12.0–15.0)
MCH: 28.1 pg (ref 26.0–34.0)
MCH: 28.1 pg (ref 26.0–34.0)
MCHC: 29.9 g/dL — ABNORMAL LOW (ref 30.0–36.0)
MCHC: 30 g/dL (ref 30.0–36.0)
MCV: 94 fL (ref 80.0–100.0)
MCV: 93.9 fL (ref 80.0–100.0)
Platelets: 206 K/uL (ref 150–400)
Platelets: 196 K/uL (ref 150–400)
RBC: 3.98 MIL/uL (ref 3.87–5.11)
RBC: 3.59 MIL/uL — ABNORMAL LOW (ref 3.87–5.11)
RDW: 18.5 % — ABNORMAL HIGH (ref 11.5–15.5)
RDW: 18.2 % — ABNORMAL HIGH (ref 11.5–15.5)
WBC: 8.1 K/uL (ref 4.0–10.5)
WBC: 7.8 K/uL (ref 4.0–10.5)
nRBC: 0 % (ref 0.0–0.2)
nRBC: 0 % (ref 0.0–0.2)

## 2023-09-22 LAB — CULTURE, BLOOD (ROUTINE X 2): Culture  Setup Time: NO GROWTH

## 2023-09-22 LAB — BASIC METABOLIC PANEL WITH GFR
Anion gap: 9 (ref 5–15)
BUN: 10 mg/dL (ref 8–23)
CO2: 32 mmol/L (ref 22–32)
Calcium: 8.4 mg/dL — ABNORMAL LOW (ref 8.9–10.3)
Chloride: 94 mmol/L — ABNORMAL LOW (ref 98–111)
Creatinine, Ser: 0.58 mg/dL (ref 0.44–1.00)
GFR, Estimated: >60 mL/min (ref >60)
Glucose, Bld: 101 mg/dL — ABNORMAL HIGH (ref 70–99)
Potassium: 3.4 mmol/L — ABNORMAL LOW (ref 3.5–5.1)
Sodium: 135 mmol/L (ref 135–145)

## 2023-09-22 LAB — CREATININE, SERUM
Creatinine, Ser: 0.7 mg/dL (ref 0.44–1.00)
GFR, Estimated: >60 mL/min (ref >60)

## 2023-09-22 MED ORDER — ASPIRIN 81 MG PO TBEC
81.0000 mg | DELAYED_RELEASE_TABLET | Freq: Every day | ORAL | Status: DC
  Administered 2023-09-22 – 2023-09-26 (×5): 81 mg via ORAL
  Filled 2023-09-22 (×5): qty 1

## 2023-09-22 MED ORDER — PREGABALIN 75 MG PO CAPS
75.0000 mg | ORAL_CAPSULE | Freq: Two times a day (BID) | ORAL | Status: DC
  Administered 2023-09-22 – 2023-09-26 (×9): 75 mg via ORAL
  Filled 2023-09-22 (×9): qty 1

## 2023-09-22 MED ORDER — POTASSIUM CHLORIDE CRYS ER 20 MEQ PO TBCR
60.0000 meq | EXTENDED_RELEASE_TABLET | Freq: Once | ORAL | Status: AC
  Administered 2023-09-22: 60 meq via ORAL
  Filled 2023-09-22: qty 3

## 2023-09-22 MED ORDER — ENOXAPARIN SODIUM 40 MG/0.4ML IJ SOSY
40.0000 mg | PREFILLED_SYRINGE | Freq: Every day | INTRAMUSCULAR | Status: DC
  Administered 2023-09-22 – 2023-09-25 (×4): 40 mg via SUBCUTANEOUS
  Filled 2023-09-22 (×4): qty 0.4

## 2023-09-22 MED ORDER — CAMPHOR-MENTHOL 0.5-0.5 % EX LOTN
TOPICAL_LOTION | CUTANEOUS | Status: DC | PRN
  Administered 2023-09-22: 1 via TOPICAL
  Filled 2023-09-22: qty 222

## 2023-09-22 NOTE — Progress Notes (Signed)
  Progress Note   Patient: Morgan Odonnell FMW:[id number] DOB: 23-Nov-1950 DOA: 09/19/2023     3 DOS: the patient was seen and examined on 09/22/2023   Brief hospital course: 73 y.o. female with medical history significant for COPD, OHS, OSA, chronic HFpEF, chronic hypoxic and hypercarbic respiratory failure, and hypothyroidism who presents with hemoptysis.   Patient was at her SNF and scheduled to return home today but developed hemoptysis last night that prompted her presentation to the ED.  Patient states that she began coughing overnight and was spitting up red blood.  She denies any associated chest pain.  She denies experiencing this previously.  She has not noticed any fever or chills.   ED Course: Upon arrival to the ED, patient is found to be afebrile and saturating mid 90s on 15 L/min of supplemental oxygen  with normal RR, normal HR, and stable BP.  Labs are most notable for normal creatinine, serum bicarbonate 33, albumin 2.2, and normal WBC.  CTA chest is negative for PE but notable for small pleural effusion, mild pulmonary edema, multifocal pneumonia, and new mediastinal and right hilar adenopathy.   Blood cultures were ordered and the patient was given 40 mg IV Lasix  and antibiotics.  Assessment and Plan: 1. Acute on chronic HFpEF; acute on chronic hypoxic & hypercarbic respiratory failure  - Continue diuresis with IV Lasix , continuing to diurese well -continue 1200cc fluid restriction -Wean o2 as tolerated, on high flow o2 overnight -Thus far net neg 6.3L   2. Pneumonia  -continue empiric azithro and rocephin , would treat for 5 days   3. Hemoptysis  - CTA chest negative for PE but concerning for pulmonary edema and multifocal pneumonia  - Initially held ASA and pharmacologic VTE ppx. Pt has remained stable, will resume   4. OHS/OSA  - BiPAP was ordered for sleep - Pt has been refusing at night   5. COPD  - Not in exacerbation  - Continue ICS-LAMA-LABA and as-needed  short-acting bronchodilators     6. Mediastinal and right hilar adenopathy  - Repeat imaging after treatment of pneumonia recommended    7. Constipation -continued on senna with colace -very good results       Subjective: Feeling better today. Still voiding well. Very good results with bowel regimen overnight  Physical Exam: Vitals:   09/22/23 0409 09/22/23 0412 09/22/23 0744 09/22/23 1236  BP: 116/63   (!) 120/48  Pulse: 86   85  Resp: 20   18  Temp: 97.7 F (36.5 C)   98.7 F (37.1 C)  TempSrc: Oral   Oral  SpO2: 98%  94% 97%  Weight:  119.3 kg    Height:       General exam: Conversant, in no acute distress Respiratory system: normal chest rise, clear, no audible wheezing Cardiovascular system: regular rhythm, s1-s2 Gastrointestinal system: Nondistended, nontender, pos BS Central nervous system: No seizures, no tremors Extremities: No cyanosis, no joint deformities Skin: No rashes, no pallor Psychiatry: Affect normal // no auditory hallucinations   Data Reviewed:  Labs reviewed: Na 135, K 3.4, Cr 0.58, WBC 8.1, Hgb 11.2, Plts 206  Family Communication: Pt in room, family at bedside  Disposition: Status is: Inpatient Remains inpatient appropriate because: severity of illness  Planned Discharge Destination: Skilled nursing facility    Author: Garnette Pelt, MD 09/22/2023 2:19 PM  For on call review [email or web address].

## 2023-09-22 NOTE — TOC Progression Note (Signed)
 Transition of Care [facility]) - Progression Note    Patient Details  Name: Morgan Odonnell MRN: [id number] Date of Birth: 04-28-1950  Transition of Care [facility]) CM/SW Contact  Jalyssa Fleisher, Nathanel, RN Phone Number: 09/22/2023, 9:57 AM  Clinical Narrative:  From [facility] rep Shona can return once stable & off HFNC, they can accept 02 [place] up to 8L. MD updated. Will do fl2 once HFNC d/c. Auth needed.    Expected Discharge Plan: Skilled Nursing Facility Barriers to Discharge: Continued Medical Work up  Expected Discharge Plan and Services     Post Acute Care Choice: Skilled Nursing Facility                                         Social Determinants of Health (SDOH) Interventions SDOH Screenings   Food Insecurity: No Food Insecurity (09/19/2023)  Housing: Low Risk  (09/19/2023)  Transportation Needs: No Transportation Needs (09/19/2023)  Utilities: Not At Risk (09/19/2023)  Depression (PHQ2-9): Medium Risk (01/13/2019)  Social Connections: Moderately Integrated (09/19/2023)  Tobacco Use: Medium Risk (09/19/2023)    Readmission Risk Interventions    09/19/2023    1:52 PM 08/17/2023    9:31 AM  Readmission Risk Prevention Plan  Transportation Screening Complete Complete  PCP or Specialist Appt within 5-7 Days  Complete  PCP or Specialist Appt within 3-5 Days Complete   Home Care Screening  Complete  Medication Review (RN CM)  Complete  HRI or Home Care Consult Complete   Social Work Consult for Recovery Care Planning/Counseling Complete   Palliative Care Screening Complete   Medication Review [employer]) Complete

## 2023-09-23 DIAGNOSIS — J189 Pneumonia, unspecified organism: Secondary | ICD-10-CM | POA: Diagnosis not present

## 2023-09-23 DIAGNOSIS — R042 Hemoptysis: Secondary | ICD-10-CM | POA: Diagnosis not present

## 2023-09-23 DIAGNOSIS — I5033 Acute on chronic diastolic (congestive) heart failure: Secondary | ICD-10-CM | POA: Diagnosis not present

## 2023-09-23 DIAGNOSIS — I509 Heart failure, unspecified: Secondary | ICD-10-CM | POA: Diagnosis not present

## 2023-09-23 LAB — COMPREHENSIVE METABOLIC PANEL WITH GFR
ALT: 10 U/L (ref 0–44)
AST: 22 U/L (ref 15–41)
Albumin: 2.1 g/dL — ABNORMAL LOW (ref 3.5–5.0)
Alkaline Phosphatase: 82 U/L (ref 38–126)
Anion gap: 7 (ref 5–15)
BUN: 12 mg/dL (ref 8–23)
CO2: 33 mmol/L — ABNORMAL HIGH (ref 22–32)
Calcium: 8.4 mg/dL — ABNORMAL LOW (ref 8.9–10.3)
Chloride: 93 mmol/L — ABNORMAL LOW (ref 98–111)
Creatinine, Ser: 0.62 mg/dL (ref 0.44–1.00)
GFR, Estimated: >60 mL/min (ref >60)
Glucose, Bld: 98 mg/dL (ref 70–99)
Potassium: 2.8 mmol/L — ABNORMAL LOW (ref 3.5–5.1)
Sodium: 133 mmol/L — ABNORMAL LOW (ref 135–145)
Total Bilirubin: 0.8 mg/dL (ref 0.0–1.2)
Total Protein: 7.3 g/dL (ref 6.5–8.1)

## 2023-09-23 MED ORDER — POTASSIUM CHLORIDE CRYS ER 20 MEQ PO TBCR
60.0000 meq | EXTENDED_RELEASE_TABLET | ORAL | Status: AC
  Administered 2023-09-23 (×2): 60 meq via ORAL
  Filled 2023-09-23 (×2): qty 3

## 2023-09-23 NOTE — Progress Notes (Signed)
 Physical Therapy Treatment Patient Details Name: Morgan Odonnell MRN: [id number] DOB: [birth year] Today's Date: 09/23/2023   History of Present Illness Pt is 73 yo female admitted on 09/19/23 with acute on chronic CHF with resp failure and hemoptysis. CT negative for PE but concerning for pulmonary edema and PNE.   Pt with recent admission and d/c to SNF.  She was getting ready to d/c from SNF and developed hemoptysis so brought to the hospital.  Pt with hx including but not limited to COPD, OHS, OSA, chronic HFpEF, chronic hypoxic and hypercarbic respiratory failure, and hypothyroidism    PT Comments   Pt admitted with above diagnosis.  Pt currently with functional limitations due to the deficits listed below (see PT Problem List). Pt in bed when PT arrived. Pt agreeable to therapy intervention. Spouse present. Pt indicated pain and discomfort all over, no reports of SOB with pt on 8 L/min HFNC and increased to 10 L/min HFNC with therapeutic activity today. Pt required max A x 2 with use of hospital bed and bed pad for supine <> sit, sitting balance EOB F with min cues and B UE support and desaturated to 88% with bed mobility on 10 L/min HFNC, pt able to progress with standing trial from EOB with pull to stand on back of recliner with mod A x 1 and CGA x 1 cues and facilitation for extension posture and encouragement for 45s with CGA to min A to maintain balance pt desaturated to 85% with exertion and required > 2 min to recover with coaching for breathing strategies seated EOB. Pt returned to bed, placed on 8 L/min HFNC, all needs in place and spouse present. Patient will benefit from continued inpatient follow up therapy, <3 hours/day.  Pt will benefit from acute skilled PT to increase their independence and safety with mobility to allow discharge.      If plan is discharge home, recommend the following: Two people to help with walking and/or transfers;Assist for transportation;Supervision due to  cognitive status;Two people to help with bathing/dressing/bathroom   Can travel by private vehicle     No  Equipment Recommendations  Hoyer lift    Recommendations for Other Services       Precautions / Restrictions Precautions Precautions: Fall Precaution/Restrictions Comments: Fragile skin, O2 dep Restrictions Weight Bearing Restrictions Per Provider Order: No     Mobility  Bed Mobility Overal bed mobility: Needs Assistance Bed Mobility: Rolling Rolling: Min assist   Supine to sit: +2 for physical assistance, +2 for safety/equipment, HOB elevated, Used rails, Max assist Sit to supine: +2 for physical assistance, +2 for safety/equipment, HOB elevated, Used rails, Max assist   General bed mobility comments: pt rolled side to side with min A, cues and use of bed rails, supine <> sit with use of bed rails, bed pad and max A x 2    Transfers Overall transfer level: Needs assistance Equipment used:  (hospital recliner) Transfers: Sit to/from Stand Sit to Stand: Mod assist, Contact guard assist, +2 physical assistance, +2 safety/equipment, From elevated surface           General transfer comment: trial with pull to stand from EOB to recliner, spouse sitting in recliner to counterbalance, pt required mod A for power up with CGA x 1 for assist, balance and cues, min to CGA to maintain static standing balance with B UE support at recliner handles on back with cues for extension posture and encouragement with pt able to toelrate static standing 45s  Ambulation/Gait               General Gait Details: NT   Stairs             Wheelchair Mobility     Tilt Bed    Modified Rankin (Stroke Patients Only)       Balance Overall balance assessment: Needs assistance Sitting-balance support: Feet supported, Single extremity supported Sitting balance-Leahy Scale: Fair     Standing balance support: Bilateral upper extremity supported, During functional  activity, Reliant on assistive device for balance Standing balance-Leahy Scale: Poor Standing balance comment: 45s static standing with B UE support and CGA to min A                            Communication Communication Communication: Impaired  Cognition Arousal: Alert Behavior During Therapy: WFL for tasks assessed/performed   PT - Cognitive impairments: Problem solving, Sequencing, Memory, Awareness                       PT - Cognition Comments: pt more alert and communicative then at Eval. pt perceverating on care at SNF and wanting to go home. Following commands: Impaired Following commands impaired: Follows one step commands with increased time    Cueing Cueing Techniques: Verbal cues, Gestural cues, Tactile cues, Visual cues  Exercises      General Comments General comments (skin integrity, edema, etc.): pt on 8 L/min HFNC at begining of therapy session and at rest 92-93%, PT increased with nursing staff aware to 10 L/min HFNC for mobility tasks, EOB pt desaturated to 88% and able to recover to 90% with cues for pursed lip breathing, s/p 45s stand pt desaturated to 85% and requried seated theraputic rest break and cues for pursed lip breathing for 2:22 to recover to 90% on 10 L/min HFNC, pt retuned to bed and PT adjusted to 8 L/min HFNC and pt 91-92% pt indicates no SOB with desaturation and exhibits difficulty with inhaling through nose and decreasing RR      Pertinent Vitals/Pain Pain Assessment Pain Assessment: Faces Faces Pain Scale: Hurts a little bit Breathing: normal Negative Vocalization: none Facial Expression: smiling or inexpressive Body Language: relaxed Consolability: no need to console PAINAD Score: 0 Pain Location: generalized Pain Descriptors / Indicators: Discomfort Pain Intervention(s): Limited activity within patient's tolerance, Monitored during session    Home Living                          Prior Function             PT Goals (current goals can now be found in the care plan section) Acute Rehab PT Goals Patient Stated Goal: not going back to rehab, I'll go hospice first. PT Goal Formulation: With patient Time For Goal Achievement: 10/04/23 Potential to Achieve Goals: Poor Progress towards PT goals: Progressing toward goals    Frequency    Min 1X/week      PT Plan      Co-evaluation              AM-PAC PT 6 Clicks Mobility   Outcome Measure  Help needed turning from your back to your side while in a flat bed without using bedrails?: A Little Help needed moving from lying on your back to sitting on the side of a flat bed without using bedrails?: A Lot Help needed moving to and  from a bed to a chair (including a wheelchair)?: A Lot Help needed standing up from a chair using your arms (e.g., wheelchair or bedside chair)?: A Lot Help needed to walk in hospital room?: Total Help needed climbing 3-5 steps with a railing? : Total 6 Click Score: 11    End of Session Equipment Utilized During Treatment: Oxygen ;Gait belt Activity Tolerance: Patient limited by fatigue Patient left: in bed;with call bell/phone within reach;with bed alarm set;with family/visitor present Nurse Communication: Mobility status;Need for lift equipment;Other (comment) (O2) PT Visit Diagnosis: Muscle weakness (generalized) (M62.81);Difficulty in walking, not elsewhere classified (R26.2);History of falling (Z91.81)     Time: 1325-1400 PT Time Calculation (min) (ACUTE ONLY): 35 min  Charges:    $Therapeutic Activity: 23-37 mins PT General Charges $$ ACUTE PT VISIT: 1 Visit                     [place], PT Acute Rehab    [place] VEAR Drone 09/23/2023, 2:45 PM

## 2023-09-23 NOTE — Progress Notes (Signed)
  Progress Note   Patient: Morgan Odonnell FMW:[id number] DOB: 1950/07/19 DOA: 09/19/2023     4 DOS: the patient was seen and examined on 09/23/2023   Brief hospital course: 73 y.o. female with medical history significant for COPD, OHS, OSA, chronic HFpEF, chronic hypoxic and hypercarbic respiratory failure, and hypothyroidism who presents with hemoptysis.   Patient was at her SNF and scheduled to return home today but developed hemoptysis last night that prompted her presentation to the ED.  Patient states that she began coughing overnight and was spitting up red blood.  She denies any associated chest pain.  She denies experiencing this previously.  She has not noticed any fever or chills.   ED Course: Upon arrival to the ED, patient is found to be afebrile and saturating mid 90s on 15 L/min of supplemental oxygen  with normal RR, normal HR, and stable BP.  Labs are most notable for normal creatinine, serum bicarbonate 33, albumin 2.2, and normal WBC.  CTA chest is negative for PE but notable for small pleural effusion, mild pulmonary edema, multifocal pneumonia, and new mediastinal and right hilar adenopathy.   Blood cultures were ordered and the patient was given 40 mg IV Lasix  and antibiotics.  Assessment and Plan: 1. Acute on chronic HFpEF; acute on chronic hypoxic & hypercarbic respiratory failure  - Continue diuresis with IV Lasix , continuing to diurese well -continue 1200cc fluid restriction -Wean o2 as tolerated, on high flow o2 overnight -Thus far net neg 6.5L   2. Pneumonia  -completed empiric azithro and rocephin  as of 7/15   3. Hemoptysis  - CTA chest negative for PE but concerning for pulmonary edema and multifocal pneumonia  - Initially held ASA and pharmacologic VTE ppx. Pt has remained stable, resumed asa and lovenox    4. OHS/OSA  - BiPAP was ordered for sleep - Pt had been refusing at night, however is now agreeable to wear   5. COPD  - Not in exacerbation  -  Continue ICS-LAMA-LABA and as-needed short-acting bronchodilators     6. Mediastinal and right hilar adenopathy  - Repeat imaging after treatment of pneumonia recommended    7. Constipation -continued on senna with colace -very good results       Subjective: States feeling better. Moving bowels. Still swollen  Physical Exam: Vitals:   09/23/23 0445 09/23/23 0736 09/23/23 0738 09/23/23 1135  BP:    (!) 106/56  Pulse:    82  Resp:    16  Temp:    98.8 F (37.1 C)  TempSrc:    Oral  SpO2:  95% 95% 92%  Weight: 114.8 kg     Height:       General exam: Awake, laying in bed, in nad Respiratory system: Normal respiratory effort, no wheezing Cardiovascular system: regular rate, s1, s2 Gastrointestinal system: Soft, nondistended, positive BS Central nervous system: CN2-12 grossly intact, strength intact Extremities: Perfused, no clubbing, LE edema Skin: Normal skin turgor, no notable skin lesions seen Psychiatry: Mood normal // no visual hallucinations   Data Reviewed:  Labs reviewed: Na 133, K 2.8, Cr 0.62  Family Communication: Pt in room, family at bedside  Disposition: Status is: Inpatient Remains inpatient appropriate because: severity of illness  Planned Discharge Destination: Skilled nursing facility    Author: Garnette Pelt, MD 09/23/2023 5:06 PM  For on call review [email or web address].

## 2023-09-23 NOTE — Plan of Care (Signed)
  Problem: Education: Goal: Knowledge of General Education information will improve Description: Including pain rating scale, medication(s)/side effects and non-pharmacologic comfort measures Outcome: Progressing   Problem: Clinical Measurements: Goal: Ability to maintain clinical measurements within normal limits will improve Outcome: Progressing Goal: Will remain free from infection Outcome: Progressing Goal: Diagnostic test results will improve Outcome: Progressing Goal: Respiratory complications will improve Outcome: Progressing   Problem: Health Behavior/Discharge Planning: Goal: Ability to manage health-related needs will improve Outcome: Not Progressing

## 2023-09-24 DIAGNOSIS — I5033 Acute on chronic diastolic (congestive) heart failure: Secondary | ICD-10-CM | POA: Diagnosis not present

## 2023-09-24 LAB — CBC
HCT: 34.3 % — ABNORMAL LOW (ref 36.0–46.0)
Hemoglobin: 10.5 g/dL — ABNORMAL LOW (ref 12.0–15.0)
MCH: 28.6 pg (ref 26.0–34.0)
MCHC: 30.6 g/dL (ref 30.0–36.0)
MCV: 93.5 fL (ref 80.0–100.0)
Platelets: 185 K/uL (ref 150–400)
RBC: 3.67 MIL/uL — ABNORMAL LOW (ref 3.87–5.11)
RDW: 18 % — ABNORMAL HIGH (ref 11.5–15.5)
WBC: 9.1 K/uL (ref 4.0–10.5)
nRBC: 0 % (ref 0.0–0.2)

## 2023-09-24 LAB — COMPREHENSIVE METABOLIC PANEL WITH GFR
ALT: 11 U/L (ref 0–44)
AST: 23 U/L (ref 15–41)
Albumin: 2.1 g/dL — ABNORMAL LOW (ref 3.5–5.0)
Alkaline Phosphatase: 77 U/L (ref 38–126)
Anion gap: 7 (ref 5–15)
BUN: 13 mg/dL (ref 8–23)
CO2: 31 mmol/L (ref 22–32)
Calcium: 8.5 mg/dL — ABNORMAL LOW (ref 8.9–10.3)
Chloride: 94 mmol/L — ABNORMAL LOW (ref 98–111)
Creatinine, Ser: 0.49 mg/dL (ref 0.44–1.00)
GFR, Estimated: >60 mL/min (ref >60)
Glucose, Bld: 103 mg/dL — ABNORMAL HIGH (ref 70–99)
Potassium: 3.3 mmol/L — ABNORMAL LOW (ref 3.5–5.1)
Sodium: 132 mmol/L — ABNORMAL LOW (ref 135–145)
Total Bilirubin: 0.8 mg/dL (ref 0.0–1.2)
Total Protein: 7.3 g/dL (ref 6.5–8.1)

## 2023-09-24 LAB — CULTURE, BLOOD (ROUTINE X 2)
Culture: NO GROWTH
Special Requests: ADEQUATE

## 2023-09-24 MED ORDER — POTASSIUM CHLORIDE CRYS ER 20 MEQ PO TBCR
40.0000 meq | EXTENDED_RELEASE_TABLET | ORAL | Status: AC
  Administered 2023-09-24 (×2): 40 meq via ORAL
  Filled 2023-09-24 (×2): qty 2

## 2023-09-24 NOTE — Progress Notes (Signed)
   09/24/23 2306  BiPAP/CPAP/SIPAP  BiPAP/CPAP/SIPAP Pt Type Adult  BiPAP/CPAP/SIPAP DREAMSTATIOND  Mask Type Full face mask  Dentures removed? Not applicable  Mask Size Medium  Respiratory Rate 18 breaths/min  Flow Rate 8 lpm  Patient Home Machine No  Patient Home Mask No  Patient Home Tubing No  Auto Titrate Yes  Minimum cmH2O 8 cmH2O  Maximum cmH2O 25 cmH2O  Device Plugged into RED Power Outlet Yes

## 2023-09-24 NOTE — Progress Notes (Signed)
 [facility] Progress Note  Patient: Morgan Odonnell     FMW:[id number]  DOA: 09/19/2023   PCP: Pcp, No       Brief hospital course: 73 year old female with severe COPD, chronic hypoxic and hypercarbic respiratory failure, hypothyroidism on 3 to 4 L of oxygen  at baseline, OHS, OSA, noncompliant with BiPAP, chronic HFpEF who presented to the hospital for hemoptysis.  She was admitted for acute on chronic heart failure and has been receiving IV Lasix .  She was recently admitted from 08/15/2023 through 09/01/2023 with possible pneumonia and has completed a course of antibiotics.  Subjective:  States that shortness of breath and hemoptysis have resolved.  Has been declining to use her BiPAP.  Assessment and Plan: Principal Problem:   Acute on chronic heart failure with preserved ejection fraction (HFpEF) - Continue IV Lasix  - Oxygen  has been weaned back down to 4 L and she is stable with a pulse ox at 89 to 92%  Active Problems:   COPD (chronic obstructive pulmonary disease)Gold C OHS/OSA -With chronic hypoxic and hypercarbic respiratory failure - She has been refusing to use the BiPAP-I have had a discussion with her today that she needs to be compliant with this  Multifocal pneumonia - Has completed antibiotics and has no cough or leukocytosis  Obesity class III Body mass index is 45.52 kg/m.       Code Status: Limited: Do not attempt resuscitation (DNR) -DNR-LIMITED -Do Not Intubate/DNI  Total time on patient care: 35 min DVT prophylaxis:  enoxaparin  (LOVENOX ) injection 40 mg Start: 09/22/23 1500 SCDs Start: 09/19/23 0402     Objective:   Vitals:   09/24/23 1300 09/24/23 1400 09/24/23 1432 09/24/23 1517  BP: 93/68     Pulse: 83   83  Resp: 18   20  Temp: (!) 97.4 F (36.3 C)     TempSrc: Oral     SpO2: 98% 99% (!) 89% 91%  Weight:      Height:       Filed Weights   09/22/23 0412 09/23/23 0445 09/24/23 0413  Weight: 119.3 kg 114.8 kg 112.9 kg    Exam: General exam: Appears comfortable  HEENT: oral mucosa moist Respiratory system: Crackles at bilateral bases Cardiovascular system: S1 & S2 heard  Gastrointestinal system: Abdomen soft, non-tender, nondistended. Normal bowel sounds   Extremities: No cyanosis, clubbing or edema Psychiatry:  Mood & affect appropriate.      CBC: Recent Labs  Lab 09/19/23 0230 09/19/23 0231 09/20/23 0550 09/21/23 1053 09/22/23 0448 09/22/23 1735 09/24/23 0509  WBC 8.6   < > 7.6 7.5 8.1 7.8 9.1  NEUTROABS 5.2  --   --   --   --   --   --   HGB 11.0*   < > 10.2* 10.2* 11.2* 10.1* 10.5*  HCT 36.8   < > 33.5* 33.0* 37.4 33.7* 34.3*  MCV 93.9   < > 93.8 93.2 94.0 93.9 93.5  PLT 219   < > 194 196 206 196 185   < > = values in this interval not displayed.   Basic Metabolic Panel: Recent Labs  Lab 09/19/23 0544 09/20/23 0550 09/21/23 1053 09/22/23 0448 09/22/23 1735 09/23/23 0458 09/24/23 0509  NA 134* 136 135 135  --  133* 132*  K 3.4* 2.7* 2.9* 3.4*  --  2.8* 3.3*  CL 93* 93* 91* 94*  --  93* 94*  CO2 35* 35* 35* 32  --  33* 31  GLUCOSE 100* 90 119*  101*  --  98 103*  BUN 11 9 9 10   --  12 13  CREATININE 0.57 0.51 0.52 0.58 0.70 0.62 0.49  CALCIUM  8.5* 8.3* 8.3* 8.4*  --  8.4* 8.5*  MG 1.6* 1.7 2.2  --   --   --   --      Scheduled Meds:  aspirin  EC  81 mg Oral Daily   citalopram   20 mg Oral Daily   docusate sodium   100 mg Oral BID   enoxaparin  (LOVENOX ) injection  40 mg Subcutaneous Daily   fluticasone  furoate-vilanterol  1 puff Inhalation Daily   furosemide   40 mg Intravenous Q12H   guaiFENesin   600 mg Oral BID   levothyroxine   200 mcg Oral Q0600   pregabalin   75 mg Oral BID   senna  1 tablet Oral Daily   sodium chloride  flush  3 mL Intravenous Q12H   umeclidinium bromide   1 puff Inhalation Daily    Imaging and lab data personally reviewed   Author: Ceria Suminski  09/24/2023 6:14 PM  To contact [facility]>   Check the care team in [facility] and look for the  attending/consulting [facility] provider listed  Log into [email or web address] and use [place]'s universal password   Go to> [facility]  and find provider  If you still have difficulty reaching the provider, please page the [facility] (Director on Call) for the Hospitalists listed on amion

## 2023-09-25 DIAGNOSIS — I5033 Acute on chronic diastolic (congestive) heart failure: Secondary | ICD-10-CM | POA: Diagnosis not present

## 2023-09-25 LAB — BASIC METABOLIC PANEL WITH GFR
Anion gap: 8 (ref 5–15)
BUN: 14 mg/dL (ref 8–23)
CO2: 31 mmol/L (ref 22–32)
Calcium: 8.5 mg/dL — ABNORMAL LOW (ref 8.9–10.3)
Chloride: 94 mmol/L — ABNORMAL LOW (ref 98–111)
Creatinine, Ser: 0.62 mg/dL (ref 0.44–1.00)
GFR, Estimated: >60 mL/min (ref >60)
Glucose, Bld: 103 mg/dL — ABNORMAL HIGH (ref 70–99)
Potassium: 3.4 mmol/L — ABNORMAL LOW (ref 3.5–5.1)
Sodium: 133 mmol/L — ABNORMAL LOW (ref 135–145)

## 2023-09-25 MED ORDER — POTASSIUM CHLORIDE CRYS ER 20 MEQ PO TBCR
40.0000 meq | EXTENDED_RELEASE_TABLET | ORAL | Status: AC
  Administered 2023-09-25 (×2): 40 meq via ORAL
  Filled 2023-09-25 (×2): qty 2

## 2023-09-25 MED ORDER — FUROSEMIDE 40 MG PO TABS
40.0000 mg | ORAL_TABLET | Freq: Two times a day (BID) | ORAL | Status: DC
  Administered 2023-09-25 – 2023-09-26 (×4): 40 mg via ORAL
  Filled 2023-09-25 (×3): qty 1
  Filled 2023-09-25: qty 2

## 2023-09-25 NOTE — Progress Notes (Signed)
 Physical Therapy Treatment Patient Details Name: Morgan Odonnell MRN: [id number] DOB: October 05, 1950 Today's Date: 09/25/2023   History of Present Illness Pt is 73 yo female admitted on 09/19/23 with acute on chronic CHF with resp failure and hemoptysis. CT negative for PE but concerning for pulmonary edema and PNE.   Pt with recent admission and d/c to SNF.  She was getting ready to d/c from SNF and developed hemoptysis so brought to the hospital.  Pt with hx including but not limited to COPD, OHS, OSA, chronic HFpEF, chronic hypoxic and hypercarbic respiratory failure, and hypothyroidism    PT Comments  Pt assisted with bed mobility and performed 3 sit to stands for strengthening and increasing activity.  Pt currently requiring at least 6L during activity (SpO2 83-90%) and cues for pursed lip breathing.  Pt fatigues quickly and requires rest breaks.  Current d/c plan remains appropriate.  Patient will benefit from continued inpatient follow up therapy, <3 hours/day.      If plan is discharge home, recommend the following: Two people to help with walking and/or transfers;Assist for transportation;Supervision due to cognitive status;Two people to help with bathing/dressing/bathroom   Can travel by private vehicle     No  Equipment Recommendations  Hoyer lift    Recommendations for Other Services       Precautions / Restrictions Precautions Precautions: Fall Precaution/Restrictions Comments: Fragile skin, O2 dep     Mobility  Bed Mobility Overal bed mobility: Needs Assistance Bed Mobility: Supine to Sit, Sit to Supine     Supine to sit: Mod assist, +2 for safety/equipment, HOB elevated, Used rails Sit to supine: Mod assist, +2 for safety/equipment   General bed mobility comments: verbal cues for technique, pt requesting assist however able to move LEs over EOB, assist for trunk upright; assist for bil LEs onto bed    Transfers Overall transfer level: Needs assistance Equipment  used:  (hospital recliner) Transfers: Sit to/from Stand Sit to Stand: Min assist, +2 safety/equipment           General transfer comment: pt performed pull to stand from EOB to recliner (as with last session), spouse sitting in recliner to counterbalance, pt required min A for power up, CGA to maintain static standing balance with B UE support at recliner handles on back; performed x3 (hold 5 sec, 10 sec and then 15 sec standing); SPO2 83-90% on 6L (on 4L at rest but spouse reports pt has needed 4-6L prior to admission); verbal cues for pursed lip breathing (pt reports difficulty due to stuffy nose, also observed mouth breathing    Ambulation/Gait                   Stairs             Wheelchair Mobility     Tilt Bed    Modified Rankin (Stroke Patients Only)       Balance Overall balance assessment: Needs assistance Sitting-balance support: No upper extremity supported, Feet supported Sitting balance-Leahy Scale: Fair     Standing balance support: Bilateral upper extremity supported, During functional activity Standing balance-Leahy Scale: Poor                              Communication Communication Communication: No apparent difficulties  Cognition Arousal: Alert Behavior During Therapy: WFL for tasks assessed/performed   PT - Cognitive impairments: No apparent impairments  Cueing    Exercises      General Comments        Pertinent Vitals/Pain Pain Assessment Pain Assessment: Faces Faces Pain Scale: Hurts a little bit Pain Location: generalized Pain Descriptors / Indicators: Discomfort Pain Intervention(s): Repositioned, Monitored during session    Home Living                          Prior Function            PT Goals (current goals can now be found in the care plan section) Progress towards PT goals: Progressing toward goals    Frequency    Min 1X/week       PT Plan      Co-evaluation              AM-PAC PT 6 Clicks Mobility   Outcome Measure  Help needed turning from your back to your side while in a flat bed without using bedrails?: A Little Help needed moving from lying on your back to sitting on the side of a flat bed without using bedrails?: A Lot Help needed moving to and from a bed to a chair (including a wheelchair)?: A Lot Help needed standing up from a chair using your arms (e.g., wheelchair or bedside chair)?: A Lot Help needed to walk in hospital room?: Total Help needed climbing 3-5 steps with a railing? : Total 6 Click Score: 11    End of Session Equipment Utilized During Treatment: Oxygen  Activity Tolerance: Patient limited by fatigue Patient left: in bed;with call bell/phone within reach;with family/visitor present Nurse Communication: Mobility status;Other (comment) (O2 on 6L) PT Visit Diagnosis: Muscle weakness (generalized) (M62.81);Difficulty in walking, not elsewhere classified (R26.2)     Time: 8742-8675 PT Time Calculation (min) (ACUTE ONLY): 27 min  Charges:    $Therapeutic Activity: 23-37 mins PT General Charges $$ ACUTE PT VISIT: 1 Visit                     Tari PT, DPT Physical Therapist Acute Rehabilitation Services Office: [phone]    Tari CROME Payson 09/25/2023, 1:37 PM

## 2023-09-25 NOTE — Care Management Important Message (Signed)
 Important Message  Patient Details IM Letter given. Name: Morgan Odonnell MRN: [id number] Date of Birth: [birth year]   Important Message Given:  Yes - Medicare IM     [name] 09/25/2023, 9:36 AM

## 2023-09-25 NOTE — TOC Progression Note (Addendum)
 Transition of Care [facility]) - Progression Note    Patient Details  Name: Morgan Odonnell MRN: [id number] Date of Birth: 08/28/1950  [facility] CM/SW Contact  Keaton Stirewalt, [place], RN Phone Number: 09/25/2023, 10:26 AM  Clinical Narrative:Initiated insurance auth for [facility] w/Bipap rep shona will order bipap-await auth-auth ID #[id number]. Await auth & bipap ordered by facility to arrive @ facility. MD updated.    -1:21p-[facility] rep shona has Bipap;still waiting on auth. Family/MD updated. -2:58p I just spoke to insurance rep-I was able to give enough info for the insurance nurse to send to their Dr-they are still reviewing. Krishnaben please do 02 sats that seems to be the concern. It is not a Peer to peer yet but just trying to prepare.   Expected Discharge Plan: Skilled Nursing Facility Barriers to Discharge: Insurance Authorization  Expected Discharge Plan and Services   Discharge Planning Services: CM Consult Post Acute Care Choice: Skilled Nursing Facility   Expected Discharge Date: 09/25/23                                     Social Determinants of Health (SDOH) Interventions SDOH Screenings   Food Insecurity: No Food Insecurity (09/19/2023)  Housing: Low Risk  (09/19/2023)  Transportation Needs: No Transportation Needs (09/19/2023)  Utilities: Not At Risk (09/19/2023)  Depression (PHQ2-9): Medium Risk (01/13/2019)  Social Connections: Moderately Integrated (09/19/2023)  Tobacco Use: Medium Risk (09/19/2023)    Readmission Risk Interventions    09/19/2023    1:52 PM 08/17/2023    9:31 AM  Readmission Risk Prevention Plan  Transportation Screening Complete Complete  PCP or Specialist Appt within 5-7 Days  Complete  PCP or Specialist Appt within 3-5 Days Complete   Home Care Screening  Complete  Medication Review (RN CM)  Complete  HRI or Home Care Consult Complete   Social Work Consult for Recovery Care Planning/Counseling Complete   Palliative  Care Screening Complete   Medication Review Oceanographer) Complete

## 2023-09-25 NOTE — Discharge Summary (Addendum)
 Physician Discharge Summary  ALGA SOUTHALL FMW:[id number] DOB: 1950/11/03 DOA: 09/19/2023  PCP: Freddrick, No  Admit date: 09/19/2023 Discharge date: 09/26/2023 Discharging to SNF  Recommendations for outpatient follow up:   1.Please Fluid restrict to 1200 ml / day 2. Please ensure patient is wearing BiPAP when asleep (even during the daytime for naps) 3. Maintain pulse ox of 88-92% due to risk of CO2 retention  4. Will need BiPAP at home prior to dc from SNF  Discharge Diagnoses:   Principal Problem:   Acute on chronic heart failure with preserved ejection fraction (HFpEF) (HCC) Active Problems: Acute on chronic hypoxic and hypercarbic respiratory failure secondary   COPD (chronic obstructive pulmonary disease)Gold C   OHS/OSA (obstructive sleep apnea)   Multifocal pneumonia  Severe generalized weakness   DNR/DNI       Brief hospital course: 73 year old female with severe COPD, chronic HFpEF OHS/OSA, chronic hypoxic and hypercarbic respiratory failure who is on 4  L of oxygen  at baseline and is noncompliant with BiPAP, chronic HFpEF who presented to the hospital for hemoptysis. She was recently admitted from 08/15/2023 through 09/01/2023 with possible pneumonia and has completed a course of antibiotics.  In the ED, she was found to have a pulse ox in the 80s on her baseline 4 L of O2   Assessment and Plan: Principal Problem: Acute on chronic hypoxic and hypercarbic respiratory failure secondary to Acute on chronic heart failure with preserved ejection fraction (HFpEF) - she was requiring 8 L O2 via high flow [place] -she has diuresed about 10 lbs with IV Lasix  and has been weaned down to 4 L- pulse ox maintaining at 90-92% - symptoms of dyspnea significantly improved  - has been transitioned back to oral Lasix   - given instruction regarding fluid restriction    Active Problems: COPD (chronic obstructive pulmonary disease)Gold C OHS/OSA H/o Nicotine  abuse - cont 4 L O2 with goal pulse  ox of 88-92% - has been poorly compliant with BiPAP in the past  - after a multiple discussions with the patient and her husband, she has been more receptive to using the BiPAP when asleep but will likely require continue encouragement to continue to use it as outpatient - she has not smoked since being her last hospital admission  Chronic hyponatremia - fluid restrict and continue Urea    Multifocal pneumonia - Has completed antibiotics and has no cough or leukocytosis   Obesity class III Body mass index is 45.52 kg/m  Severe generalized weakness and deconditioning - will return to SNF for rehab  Chronic constipation - continue Movantik and Miralax   Have had goals of care discussion with patient and husband. She is DNR/DNI          Discharge Instructions   Allergies as of 09/26/2023       Reactions   Gabapentin Other (See Comments)   slept for 2 days   Naloxone  Other (See Comments)   Allergy not listed on MAR    Novocain [procaine] Other (See Comments)   intolerance        Medication List     STOP taking these medications    Glucerna Liqd   nicotine  polacrilex 4 MG gum Commonly known as: NICORETTE    oxyCODONE -acetaminophen  5-325 MG tablet Commonly known as: PERCOCET/ROXICET   propranolol  10 MG tablet Commonly known as: INDERAL    Relistor 150 MG Tabs Generic drug: Methylnaltrexone Bromide       TAKE these medications    AeroChamber Z-Stat Plus Chambr Misc Use  as directed   albuterol  108 (90 Base) MCG/ACT inhaler Commonly known as: VENTOLIN  HFA Inhale 2 puffs into the lungs every 6 (six) hours as needed for wheezing.   aspirin  EC 81 MG tablet Take 1 tablet (81 mg total) by mouth daily.   budesonide -formoterol  160-4.5 MCG/ACT inhaler Commonly known as: Symbicort  INHALE TWO puffs into lung TWICE DAILY   cholecalciferol 25 MCG (1000 UNIT) tablet Commonly known as: VITAMIN D3 Take 5,000 Units by mouth daily.   citalopram  20 MG  tablet Commonly known as: CELEXA  Take 1 tablet (20 mg total) by mouth daily.   diclofenac  sodium 1 % Gel Commonly known as: VOLTAREN  Apply 1 application topically 4 (four) times daily. Applied to knees, shoulders, wrists, and ankles   furosemide  40 MG tablet Commonly known as: LASIX  Takes 2 tables in morning and 1 tab in evening What changed:  how much to take how to take this when to take this additional instructions   ipratropium-albuterol  0.5-2.5 (3) MG/3ML Soln Commonly known as: DUONEB Take 3 mLs by nebulization every 6 (six) hours as needed (wheezing with dyspnea).   levothyroxine  200 MCG tablet Commonly known as: SYNTHROID  Take 1 tablet (200 mcg total) by mouth daily before breakfast.   Movantik 25 MG Tabs tablet Generic drug: naloxegol oxalate Take 25 mg by mouth daily.   polyethylene glycol 17 g packet Commonly known as: MIRALAX  / GLYCOLAX  Take 17 g by mouth daily as needed for mild constipation.   potassium chloride  20 MEQ packet Commonly known as: KLOR-CON  Take 20 mEq by mouth 2 (two) times daily.   pregabalin  75 MG capsule Commonly known as: LYRICA  Take 75 mg by mouth 2 (two) times daily.   tiotropium 18 MCG inhalation capsule Commonly known as: Spiriva  HandiHaler Place 1 capsule (18 mcg total) into inhaler and inhale daily.   urea  15 g Pack oral packet Commonly known as: URE-NA Take 15 g by mouth 2 (two) times daily.               Durable Medical Equipment  (From admission, onward)           Start     Ordered   09/25/23 0903  For home use only DME Bipap  Once       Question Answer Comment  Length of Need Lifetime   Bleed in oxygen  (LPM) 8   Inspiratory pressure 8   Expiratory pressure 20      09/25/23 0902            Contact information for after-discharge care     Destination     [facility]   Service: Skilled Nursing Contact information: [address] [place]   [phone]                        The results of significant diagnostics from this hospitalization (including imaging, microbiology, ancillary and laboratory) are listed below for reference.    CT Angio Chest PE W and/or Wo Contrast Result Date: 09/19/2023 CLINICAL DATA:  The EXAM: CT ANGIOGRAPHY CHEST WITH CONTRAST TECHNIQUE: Multidetector CT imaging of the chest was performed using the standard protocol during bolus administration of intravenous contrast. Multiplanar CT image reconstructions and MIPs were obtained to evaluate the vascular anatomy. RADIATION DOSE REDUCTION: This exam was performed according to the departmental dose-optimization program which includes automated exposure control, adjustment of the mA and/or kV according to patient size and/or use of iterative reconstruction technique. CONTRAST:  OMNIPAQUE  IOHEXOL  350 MG/ML SOLN COMPARISON:  Portable chest 08/25/2023, portable chest 08/15/2023, AP Lat chest 09/28/2019, older chest CT with IV contrast 10/29/2007. FINDINGS: Cardiovascular: There is diagnostic arterial opacification. No evidence of arterial embolus. The pulmonary arteries and trunk are prominent consistent with arterial hypertension. The pulmonary trunk is 3.5 cm. There is moderate cardiomegaly with prominence of the right chambers consistent with right heart dysfunction possibly chronic, with IVC reflux. There is no pericardial effusion. There are scattered three-vessel coronary calcifications and a heavily calcified mitral ring. The central pulmonary veins are slightly prominent. There are scattered calcific plaques in the aorta and great vessels, mild tortuosity, without aneurysm, stenosis or dissection. There are scattered calcifications and slight thickening of the aortic valve leaflets. Mediastinum/Nodes: There is new multifocal mediastinal adenopathy. For example there is a left prevascular space node 1.4 cm in short axis on 4:29, right  paratracheal adenopathy, largest of these measuring 1.7 cm on 4:34, and AP window lymph node is 1.3 cm on 4:34, and a subcarinal lymph node is 1.4 cm on 4:57. In the hila, there are enlarged lymph nodes on the right the largest of which is 1.6 cm on 4:50, single slightly prominent left hilar lymph node 1.1 cm on 4:33. There is no bulky or encasing adenopathy or mass effect associated. This could be reactive adenopathy. Axillary spaces are clear. Thyroid  gland is not seen and could be above the plane of imaging or atrophic in the interval. Thoracic trachea and main bronchi are patent. Thoracic esophagus unremarkable. Small hiatal hernia. Mildly prominent right paraesophageal lymph nodes in the lower mediastinum are also seen. Lungs/Pleura: There are symmetric small layering pleural effusions. There is mild interlobular septal thickening in the lung bases consistent with mild edema. Patchy consolidation noted in the dorsal inferior lingula and in left lower lobe consistent with pneumonia or aspiration intermixed with coarsely reticular atelectatic change. There is additional increased opacity in the anterior segment of the right upper lobe likely additional focal infiltrate. There is diffuse bronchial thickening which could be congestive or bronchitic. Respiratory motion is noted with no other focal lung opacities allowing for motion. No pneumothorax. Upper Abdomen: Moderate hepatic steatosis.  Remote cholecystectomy. There appears to be a mildly cirrhotic liver configuration, capsular nodularity in the left lobe. No splenomegaly or ascites. No acute findings. Musculoskeletal: Degenerative changes and mild kyphodextroscoliosis thoracic spine. No acute or other significant osseous findings or focal pathologic process. Unremarkable visualized chest wall. Review of the MIP images confirms the above findings. IMPRESSION: 1. No evidence of arterial embolus. 2. Prominent pulmonary arteries and trunk consistent with arterial  hypertension. 3. Moderate cardiomegaly with prominence of the right chambers consistent with right heart dysfunction possibly chronic, with IVC reflux. 4. Aortic and coronary artery atherosclerosis. 5. Small pleural effusions with mild interlobular septal thickening in the lung bases consistent with mild edema. 6. Patchy consolidation in the dorsal lingula and left lower lobe consistent with pneumonia or aspiration intermixed with coarsely reticular atelectatic change. 7. Additional focal opacity in the anterior segment of the right upper lobe likely additional focal infiltrate. 8. Diffuse bronchial thickening which could be congestive or bronchitic. 9. New multifocal mediastinal and right hilar adenopathy. This could be reactive. Follow-up CT recommended after treatment. 10. Hepatic steatosis with mildly cirrhotic liver configuration. No splenomegaly or ascites. 11. Small hiatal hernia. Aortic Atherosclerosis (ICD10-I70.0). Electronically Signed   By: Francis Quam M.D.   On: 09/19/2023 03:35   DG Chest Portable 1 View Result Date: 09/19/2023 CLINICAL  DATA:  Hemoptysis, shortness of breath EXAM: PORTABLE CHEST 1 VIEW COMPARISON:  08/25/2023 FINDINGS: Cardiomegaly, vascular congestion. Diffuse interstitial prominence and bilateral lower lobe airspace opacities, likely edema. No effusions. No acute bony abnormality. IMPRESSION: Cardiomegaly with vascular congestion and mild to moderate pulmonary edema. Electronically Signed   By: Franky Crease M.D.   On: 09/19/2023 02:58   Labs:   Basic Metabolic Panel: Recent Labs  Lab 09/20/23 0550 09/21/23 1053 09/22/23 0448 09/22/23 1735 09/23/23 0458 09/24/23 0509 09/25/23 0513 09/26/23 0446  NA 136 135 135  --  133* 132* 133* 134*  K 2.7* 2.9* 3.4*  --  2.8* 3.3* 3.4* 3.4*  CL 93* 91* 94*  --  93* 94* 94* 94*  CO2 35* 35* 32  --  33* 31 31 29   GLUCOSE 90 119* 101*  --  98 103* 103* 89  BUN 9 9 10   --  12 13 14 17   CREATININE 0.51 0.52 0.58 0.70 0.62 0.49  0.62 0.67  CALCIUM  8.3* 8.3* 8.4*  --  8.4* 8.5* 8.5* 8.6*  MG 1.7 2.2  --   --   --   --   --  1.8     CBC: Recent Labs  Lab 09/20/23 0550 09/21/23 1053 09/22/23 0448 09/22/23 1735 09/24/23 0509  WBC 7.6 7.5 8.1 7.8 9.1  HGB 10.2* 10.2* 11.2* 10.1* 10.5*  HCT 33.5* 33.0* 37.4 33.7* 34.3*  MCV 93.8 93.2 94.0 93.9 93.5  PLT 194 196 206 196 185         SIGNED:   True Atlas, MD  [facility] 09/26/2023, 1:27 PM Time taking on discharge: 50 minutes

## 2023-09-25 NOTE — Progress Notes (Signed)
 Patient tolerated CPAP for only 3 hours last night. Weaned her off from HFNC 4 to regular Nasal cannula 4 l/m. O2 sat has been in 90-95 %.

## 2023-09-25 NOTE — NC FL2 (Signed)
 [place]  MEDICAID FL2 LEVEL OF CARE FORM     IDENTIFICATION  Patient Name: Morgan Odonnell Birthdate: 09/28/1950 Sex: female Admission Date (Current Location): 09/19/2023  [facility] and [place] Number:  Producer, television/film/video and Address:  [facility],  [address] [place], [place] [address]      Provider Number: [id number]  Attending Physician Name and Address:  Earley Saucer, MD  Relative Name and Phone Number:  [name]    Current Level of Care: Hospital Recommended Level of Care: Skilled Nursing Facility Prior Approval Number:    Date Approved/Denied:   PASRR Number: [id number]  Discharge Plan: SNF    Current Diagnoses: Patient Active Problem List   Diagnosis Date Noted   Acute on chronic heart failure with preserved ejection fraction (HFpEF) (HCC) 09/19/2023   Hyponatremia 08/29/2023   Dysphagia 08/29/2023   Palliative care encounter 08/29/2023   Counseling and coordination of care 08/29/2023   Goals of care, counseling/discussion 08/29/2023   Heart failure (HCC) 08/29/2023   Need for emotional support 08/29/2023   Multifocal pneumonia 08/26/2023   Normocytic anemia 08/16/2023   Precordial pain 02/21/2021   Exertional dyspnea 02/21/2021   Acute on chronic respiratory failure with hypoxia and hypercapnia (HCC) 06/27/2017   Hyperkalemia 06/27/2017   Acute renal failure superimposed on stage 2 chronic kidney disease (HCC) 06/27/2017   Restrictive lung disease 12/13/2015   Obesity hypoventilation syndrome (HCC) 05/10/2015   Chronic respiratory failure (HCC) 05/10/2015   Acute on chronic diastolic CHF (congestive heart failure) (HCC) 03/03/2015   OSA (obstructive sleep apnea) 03/02/2015   Morbid obesity with BMI of 50.0-59.9, adult (HCC) 10/18/2013   Class 3 severe obesity with serious comorbidity and body mass index (BMI) of 60.0 to 69.9 in adult 10/18/2013   Lymphedema 01/26/2013   Chronic diastolic CHF (congestive heart failure) (HCC)  01/04/2013   Chronic cough    COPD (chronic obstructive pulmonary disease)Gold C    Snoring    Low back pain    Hypertension     Orientation RESPIRATION BLADDER Height & Weight     Self, Time, Situation, Place  O2, Other (Comment) (Bipap) Continent Weight: 113.1 kg Height:  5' 2 (157.5 cm)  BEHAVIORAL SYMPTOMS/MOOD NEUROLOGICAL BOWEL NUTRITION STATUS      Continent Diet (dysphagia 3)  AMBULATORY STATUS COMMUNICATION OF NEEDS Skin   Extensive Assist Verbally Normal, PU Stage and Appropriate Care, Other (Comment) (R arm/bilateral leg wounds-see d/c summary) PU Stage 1 Dressing: Daily                     Personal Care Assistance Level of Assistance  Feeding, Dressing, Bathing Bathing Assistance: Limited assistance Feeding assistance: Limited assistance Dressing Assistance: Limited assistance     Functional Limitations Info  Sight, Hearing, Speech Sight Info: Impaired (eyeglasses) Hearing Info: Adequate Speech Info: Adequate    SPECIAL CARE FACTORS FREQUENCY  PT (By licensed PT), OT (By licensed OT)     PT Frequency: 5x week OT Frequency: 5x week            Contractures Contractures Info: Not present    Additional Factors Info  Code Status, Allergies, Psychotropic Code Status Info: DNR Allergies Info: Gabapentin, Naloxone , Novocain (Procaine)           Current Medications (09/25/2023):  This is the current hospital active medication list Current Facility-Administered Medications  Medication Dose Route Frequency Provider Last Rate Last Admin   acetaminophen  (TYLENOL ) tablet 650 mg  650 mg Oral Q6H PRN  Opyd, Timothy S, MD       Or   acetaminophen  (TYLENOL ) suppository 650 mg  650 mg Rectal Q6H PRN Opyd, Timothy S, MD       aspirin  EC tablet 81 mg  81 mg Oral Daily Cindy Garnette POUR, MD   81 mg at 09/25/23 0908   camphor-menthol  (SARNA) lotion   Topical PRN Cindy Garnette POUR, MD   1 Application at 09/22/23 1526   citalopram  (CELEXA ) tablet 20 mg  20 mg Oral  Daily Opyd, Timothy S, MD   20 mg at 09/25/23 0908   docusate sodium  (COLACE) capsule 100 mg  100 mg Oral BID Cindy Garnette POUR, MD   100 mg at 09/25/23 0908   enoxaparin  (LOVENOX ) injection 40 mg  40 mg Subcutaneous Daily Cindy Garnette POUR, MD   40 mg at 09/25/23 9090   fluticasone  furoate-vilanterol (BREO ELLIPTA ) 200-25 MCG/ACT 1 puff  1 puff Inhalation Daily Opyd, Timothy S, MD   1 puff at 09/25/23 9374   furosemide  (LASIX ) tablet 40 mg  40 mg Oral BID Rizwan, Saima, MD   40 mg at 09/25/23 0908   guaiFENesin  (MUCINEX ) 12 hr tablet 600 mg  600 mg Oral BID Cindy Garnette POUR, MD   600 mg at 09/25/23 0908   ipratropium-albuterol  (DUONEB) 0.5-2.5 (3) MG/3ML nebulizer solution 3 mL  3 mL Nebulization Q4H PRN Opyd, Timothy S, MD   3 mL at 09/20/23 2202   levothyroxine  (SYNTHROID ) tablet 200 mcg  200 mcg Oral Q0600 Opyd, Timothy S, MD   200 mcg at 09/25/23 0447   Oral care mouth rinse  15 mL Mouth Rinse PRN Cindy Garnette POUR, MD       Oral care mouth rinse  15 mL Mouth Rinse PRN Cindy Garnette POUR, MD       oxyCODONE  (Oxy IR/ROXICODONE ) immediate release tablet 5 mg  5 mg Oral Q4H PRN Opyd, Timothy S, MD   5 mg at 09/25/23 0913   polyethylene glycol (MIRALAX  / GLYCOLAX ) packet 17 g  17 g Oral Daily PRN Opyd, Timothy S, MD       potassium chloride  SA (KLOR-CON  M) CR tablet 40 mEq  40 mEq Oral Q4H Rizwan, Saima, MD   40 mEq at 09/25/23 0908   pregabalin  (LYRICA ) capsule 75 mg  75 mg Oral BID Cindy Garnette POUR, MD   75 mg at 09/25/23 9091   prochlorperazine  (COMPAZINE ) injection 5 mg  5 mg Intravenous Q6H PRN Opyd, Timothy S, MD       senna (SENOKOT) tablet 8.6 mg  1 tablet Oral Daily Cindy Garnette POUR, MD   8.6 mg at 09/25/23 0908   sodium chloride  flush (NS) 0.9 % injection 3 mL  3 mL Intravenous Q12H Opyd, Timothy S, MD   3 mL at 09/24/23 2130   umeclidinium bromide  (INCRUSE ELLIPTA ) 62.5 MCG/ACT 1 puff  1 puff Inhalation Daily Cindy Garnette POUR, MD   1 puff at 09/25/23 9373     Discharge Medications: Please see  discharge summary for a list of discharge medications.  Relevant Imaging Results:  Relevant Lab Results:   Additional Information SS#[id number], [place], [place]

## 2023-09-26 LAB — BASIC METABOLIC PANEL WITH GFR
Anion gap: 11 (ref 5–15)
BUN: 17 mg/dL (ref 8–23)
CO2: 29 mmol/L (ref 22–32)
Calcium: 8.6 mg/dL — ABNORMAL LOW (ref 8.9–10.3)
Chloride: 94 mmol/L — ABNORMAL LOW (ref 98–111)
Creatinine, Ser: 0.67 mg/dL (ref 0.44–1.00)
GFR, Estimated: >60 mL/min (ref >60)
Glucose, Bld: 89 mg/dL (ref 70–99)
Potassium: 3.4 mmol/L — ABNORMAL LOW (ref 3.5–5.1)
Sodium: 134 mmol/L — ABNORMAL LOW (ref 135–145)

## 2023-09-26 LAB — MAGNESIUM: Magnesium: 1.8 mg/dL (ref 1.7–2.4)

## 2023-09-26 MED ORDER — IPRATROPIUM-ALBUTEROL 0.5-2.5 (3) MG/3ML IN SOLN
3.0000 mL | Freq: Four times a day (QID) | RESPIRATORY_TRACT | Status: AC | PRN
Start: 2023-09-26 — End: ?

## 2023-09-26 MED ORDER — POTASSIUM CHLORIDE CRYS ER 20 MEQ PO TBCR: 20.0000 meq | EXTENDED_RELEASE_TABLET | Freq: Two times a day (BID) | ORAL | Status: DC

## 2023-09-26 MED ORDER — POTASSIUM CHLORIDE CRYS ER 20 MEQ PO TBCR
40.0000 meq | EXTENDED_RELEASE_TABLET | ORAL | Status: AC
  Administered 2023-09-26 (×2): 40 meq via ORAL
  Filled 2023-09-26 (×2): qty 2

## 2023-09-26 NOTE — Progress Notes (Signed)
 Called [facility] 2x to give report, no answer so far.

## 2023-09-26 NOTE — TOC Transition Note (Signed)
 Transition of Care [facility]) - Discharge Note   Patient Details  Name: Morgan Odonnell MRN: [id number] Date of Birth: December 11, 1950  Transition of Care [facility]) CM/SW Contact:  Bascom Service, RN Phone Number: 09/26/2023, 5:00 PM   Clinical Narrative:   P2P approved st SNf [facility] going to rm#[id number],report#[id number], ext [phone]. PTAR called. No further CM needs.    Final next level of care: Skilled Nursing Facility Barriers to Discharge: No Barriers Identified   Patient Goals and CMS Choice Patient states their goals for this hospitalization and ongoing recovery are:: Rehab CMS Medicare.gov Compare Post Acute Care list provided to:: Patient Choice offered to / list presented to : Patient, Spouse [place] ownership interest in [facility].provided to:: Spouse    Discharge Placement                       Discharge Plan and Services Additional resources added to the After Visit Summary for     Discharge Planning Services: CM Consult Post Acute Care Choice: Skilled Nursing Facility                               Social Drivers of Health (SDOH) Interventions SDOH Screenings   Food Insecurity: No Food Insecurity (09/19/2023)  Housing: Low Risk  (09/19/2023)  Transportation Needs: No Transportation Needs (09/19/2023)  Utilities: Not At Risk (09/19/2023)  Depression (PHQ2-9): Medium Risk (01/13/2019)  Social Connections: Moderately Integrated (09/19/2023)  Tobacco Use: Medium Risk (09/19/2023)     Readmission Risk Interventions    09/19/2023    1:52 PM 08/17/2023    9:31 AM  Readmission Risk Prevention Plan  Transportation Screening Complete Complete  PCP or Specialist Appt within 5-7 Days  Complete  PCP or Specialist Appt within 3-5 Days Complete   Home Care Screening  Complete  Medication Review (RN CM)  Complete  HRI or Home Care Consult Complete   Social Work Consult for Recovery Care Planning/Counseling Complete   Palliative Care Screening Complete    Medication Review Oceanographer) Complete

## 2023-09-26 NOTE — Plan of Care (Signed)
 Peer to Peer completed today and patient has been approved for SNF. DC summary has been updated. She is stable for discharge.

## 2023-09-26 NOTE — TOC Progression Note (Addendum)
 Transition of Care [facility]) - Progression Note    Patient Details  Name: Morgan Odonnell MRN: [id number] Date of Birth: 08/06/1950  Transition of Care [facility]) CM/SW Contact  Saveah Bahar, Nathanel, RN Phone Number: 09/26/2023, 9:53 AM  Clinical Narrative:  shara is still under review by MD-awaiting auth. -12:26p Dr. Earley insurance offering Peer to Peer tel# [phone], option 5, mbr#[phone], Latrina Guttman 2050/08/03. due by today @4 :30p.    Expected Discharge Plan: Skilled Nursing Facility Barriers to Discharge: Insurance Authorization  Expected Discharge Plan and Services   Discharge Planning Services: CM Consult Post Acute Care Choice: Skilled Nursing Facility   Expected Discharge Date: 09/25/23                                     Social Determinants of Health (SDOH) Interventions SDOH Screenings   Food Insecurity: No Food Insecurity (09/19/2023)  Housing: Low Risk  (09/19/2023)  Transportation Needs: No Transportation Needs (09/19/2023)  Utilities: Not At Risk (09/19/2023)  Depression (PHQ2-9): Medium Risk (01/13/2019)  Social Connections: Moderately Integrated (09/19/2023)  Tobacco Use: Medium Risk (09/19/2023)    Readmission Risk Interventions    09/19/2023    1:52 PM 08/17/2023    9:31 AM  Readmission Risk Prevention Plan  Transportation Screening Complete Complete  PCP or Specialist Appt within 5-7 Days  Complete  PCP or Specialist Appt within 3-5 Days Complete   Home Care Screening  Complete  Medication Review (RN CM)  Complete  HRI or Home Care Consult Complete   Social Work Consult for Recovery Care Planning/Counseling Complete   Palliative Care Screening Complete   Medication Review Oceanographer) Complete

## 2023-09-26 NOTE — Progress Notes (Signed)
 SATURATION QUALIFICATIONS: (This note is used to comply with regulatory documentation for home oxygen )  Patient Saturations on 4 Liters of oxygen  at rest= 90-93%  Please briefly explain why patient needs home oxygen : Patient baseline 4LNC.

## 2023-09-26 NOTE — Progress Notes (Signed)
 Patient wore CPAP for approx 1hr . Lunch tray came and patient wanted to eat. 4LNC applied. O2 90% at this time.

## 2023-09-29 ENCOUNTER — Inpatient Hospital Stay: Admitting: Primary Care

## 2023-10-02 ENCOUNTER — Other Ambulatory Visit: Payer: Self-pay

## 2023-10-06 ENCOUNTER — Other Ambulatory Visit: Payer: Self-pay

## 2023-10-06 DIAGNOSIS — I70213 Atherosclerosis of native arteries of extremities with intermittent claudication, bilateral legs: Secondary | ICD-10-CM

## 2023-10-07 NOTE — Telephone Encounter (Signed)
 ATC regarding refill for Symbicort  and Spiriva  inhalers.

## 2023-10-15 NOTE — Progress Notes (Deleted)
 Patient ID: Morgan Odonnell, female   DOB: [birth year], 73 y.o.   MRN: [id number]  Reason for Consult: No chief complaint on file.   Referred by ImmordinoGarnette, FNP  Subjective:     HPI Morgan Odonnell is a 73 y.o. female presenting for evaluation of lower extremity wounds. ***  Past Medical History:  Diagnosis Date   Allergies    Anemia    Back pain    Bilateral swelling of feet    CHF (congestive heart failure) (HCC)    Chronic cough    COPD (chronic obstructive pulmonary disease) (HCC)    Depression    GERD (gastroesophageal reflux disease)    Heart murmur    Hypertension    Hypocapnia    Hypothyroid    Knee pain    Leg edema    chronic, bilateral   Lightheaded    Low back pain    Morbid obesity (HCC)    Neck pain    Obesity hypoventilation syndrome (HCC)    Oral lesion    Shortness of breath    Shortness of breath    Shoulder pain    Snoring    Family History  Problem Relation Age of Onset   Ovarian cancer Mother    Lung disease Mother    Heart failure Mother    Hypertension Mother    Heart disease Mother    Seizures Mother    Cancer Mother    Depression Mother    Sleep apnea Mother    Alcoholism Mother    Eating disorder Mother    Obesity Mother    Hypertension Father    Eating disorder Father    Obesity Father    Anxiety disorder Father    Lymphoma Brother    Ovarian cancer Maternal Aunt    Asthma Daughter    Past Surgical History:  Procedure Laterality Date   APPENDECTOMY     CHOLECYSTECTOMY     COLONOSCOPY N/A 06/25/2013   Procedure: COLONOSCOPY;  Surgeon: Belvie JONETTA Just, MD;  Location: [facility];  Service: Endoscopy;  Laterality: N/A;   ECTOPIC PREGNANCY SURGERY     Left Ovary Removed     MENISCUS REPAIR     TONSILLECTOMY     TUBAL LIGATION      Short Social History:  Social History   Tobacco Use   Smoking status: Former    Current packs/day: 0.00    Average packs/day: 2.0 packs/day for 45.0 years (90.0 ttl  pk-yrs)    Types: Cigarettes    Start date: 04/11/1970    Quit date: 04/12/2015    Years since quitting: 8.5   Smokeless tobacco: Never   Tobacco comments:    still chews nicotine  gum  Substance Use Topics   Alcohol use: No    Alcohol/week: 0.0 standard drinks of alcohol    Allergies  Allergen Reactions   Gabapentin Other (See Comments)    slept for 2 days   Naloxone  Other (See Comments)    Allergy not listed on MAR    Novocain [Procaine] Other (See Comments)    intolerance    Current Outpatient Medications  Medication Sig Dispense Refill   albuterol  (PROVENTIL  HFA;VENTOLIN  HFA) 108 (90 BASE) MCG/ACT inhaler Inhale 2 puffs into the lungs every 6 (six) hours as needed for wheezing.      aspirin  EC 81 MG EC tablet Take 1 tablet (81 mg total) by mouth daily. 30 tablet 0   budesonide -formoterol  (SYMBICORT ) 160-4.5 MCG/ACT inhaler INHALE  TWO puffs into lung TWICE DAILY 1 each 11   cholecalciferol (VITAMIN D3) 25 MCG (1000 UNIT) tablet Take 5,000 Units by mouth daily.     citalopram  (CELEXA ) 20 MG tablet Take 1 tablet (20 mg total) by mouth daily. 30 tablet 0   diclofenac  sodium (VOLTAREN ) 1 % GEL Apply 1 application topically 4 (four) times daily. Applied to knees, shoulders, wrists, and ankles     furosemide  (LASIX ) 40 MG tablet Takes 2 tables in morning and 1 tab in evening (Patient taking differently: Take 40 mg by mouth daily.)     ipratropium-albuterol  (DUONEB) 0.5-2.5 (3) MG/3ML SOLN Take 3 mLs by nebulization every 6 (six) hours as needed (wheezing with dyspnea).     levothyroxine  (SYNTHROID ) 200 MCG tablet Take 1 tablet (200 mcg total) by mouth daily before breakfast. 90 tablet 0   naloxegol oxalate (MOVANTIK) 25 MG TABS tablet Take 25 mg by mouth daily.     polyethylene glycol (MIRALAX  / GLYCOLAX ) 17 g packet Take 17 g by mouth daily as needed for mild constipation.     potassium chloride  (KLOR-CON ) 20 MEQ packet Take 20 mEq by mouth 2 (two) times daily.     pregabalin  (LYRICA )  75 MG capsule Take 75 mg by mouth 2 (two) times daily.     Spacer/Aero-Holding Chambers (AEROCHAMBER Z-STAT PLUS CHAMBR) MISC Use as directed 1 each 0   tiotropium (SPIRIVA  HANDIHALER) 18 MCG inhalation capsule Place 1 capsule (18 mcg total) into inhaler and inhale daily. 30 capsule 11   urea  (URE-NA) 15 g PACK oral packet Take 15 g by mouth 2 (two) times daily.     No current facility-administered medications for this visit.    REVIEW OF SYSTEMS  All other systems were reviewed and are negative     Objective:  Objective   There were no vitals filed for this visit. There is no height or weight on file to calculate BMI.  Physical Exam General: no acute distress Cardiac: hemodynamically stable Pulm: normal work of breathing Abdomen: non-tender, no pulsatile mass*** Neuro: alert, no focal deficit Extremities: *** Vascular:   Right: ***  Left: ***  Data: ABI ***  Echo reviewed EF 50-55, no RWMA, moderately dilated LA and RA, mild MS, mild AR  BMP reviewed, creatinine 2.1, GFR 24  Noninvasive vascular lab from outside facility      Assessment/Plan:   Morgan Odonnell is a 73 y.o. female with ***  Recommendations to optimize cardiovascular risk: Abstinence from all tobacco products. Blood glucose control with goal A1c < 7%. Blood pressure control with goal blood pressure < 140/90 mmHg. Lipid reduction therapy with goal LDL-C <100 mg/dL  Aspirin  81mg  PO QD.  Atorvastatin 40-80mg  PO QD (or other high intensity statin therapy).   Norman GORMAN Serve MD [facility]

## 2023-10-16 ENCOUNTER — Inpatient Hospital Stay (HOSPITAL_COMMUNITY)
Admission: EM | Admit: 2023-10-16 | Discharge: 2023-10-24 | DRG: 189 | Disposition: A | Discharge status: Home Health | Attending: Internal Medicine | Admitting: Internal Medicine

## 2023-10-16 ENCOUNTER — Other Ambulatory Visit: Payer: Self-pay

## 2023-10-16 ENCOUNTER — Encounter (HOSPITAL_COMMUNITY): Payer: Self-pay

## 2023-10-16 ENCOUNTER — Emergency Department (HOSPITAL_COMMUNITY)

## 2023-10-16 DIAGNOSIS — J9622 Acute and chronic respiratory failure with hypercapnia: Secondary | ICD-10-CM | POA: Diagnosis present

## 2023-10-16 DIAGNOSIS — I110 Hypertensive heart disease with heart failure: Secondary | ICD-10-CM | POA: Diagnosis present

## 2023-10-16 DIAGNOSIS — K219 Gastro-esophageal reflux disease without esophagitis: Secondary | ICD-10-CM | POA: Diagnosis present

## 2023-10-16 DIAGNOSIS — N179 Acute kidney failure, unspecified: Secondary | ICD-10-CM | POA: Diagnosis present

## 2023-10-16 DIAGNOSIS — Z79899 Other long term (current) drug therapy: Secondary | ICD-10-CM

## 2023-10-16 DIAGNOSIS — E662 Morbid (severe) obesity with alveolar hypoventilation: Secondary | ICD-10-CM | POA: Diagnosis present

## 2023-10-16 DIAGNOSIS — E873 Alkalosis: Secondary | ICD-10-CM | POA: Diagnosis not present

## 2023-10-16 DIAGNOSIS — Z7401 Bed confinement status: Secondary | ICD-10-CM | POA: Diagnosis not present

## 2023-10-16 DIAGNOSIS — I5033 Acute on chronic diastolic (congestive) heart failure: Secondary | ICD-10-CM | POA: Diagnosis present

## 2023-10-16 DIAGNOSIS — Z7982 Long term (current) use of aspirin: Secondary | ICD-10-CM

## 2023-10-16 DIAGNOSIS — Z79890 Hormone replacement therapy (postmenopausal): Secondary | ICD-10-CM | POA: Diagnosis not present

## 2023-10-16 DIAGNOSIS — Z91199 Patient's noncompliance with other medical treatment and regimen due to unspecified reason: Secondary | ICD-10-CM

## 2023-10-16 DIAGNOSIS — Z8249 Family history of ischemic heart disease and other diseases of the circulatory system: Secondary | ICD-10-CM

## 2023-10-16 DIAGNOSIS — I5032 Chronic diastolic (congestive) heart failure: Secondary | ICD-10-CM | POA: Diagnosis not present

## 2023-10-16 DIAGNOSIS — Z7951 Long term (current) use of inhaled steroids: Secondary | ICD-10-CM

## 2023-10-16 DIAGNOSIS — J418 Mixed simple and mucopurulent chronic bronchitis: Secondary | ICD-10-CM | POA: Diagnosis not present

## 2023-10-16 DIAGNOSIS — G9341 Metabolic encephalopathy: Secondary | ICD-10-CM | POA: Diagnosis present

## 2023-10-16 DIAGNOSIS — G8929 Other chronic pain: Secondary | ICD-10-CM | POA: Diagnosis present

## 2023-10-16 DIAGNOSIS — R4182 Altered mental status, unspecified: Secondary | ICD-10-CM

## 2023-10-16 DIAGNOSIS — F32A Depression, unspecified: Secondary | ICD-10-CM | POA: Diagnosis present

## 2023-10-16 DIAGNOSIS — Z807 Family history of other malignant neoplasms of lymphoid, hematopoietic and related tissues: Secondary | ICD-10-CM

## 2023-10-16 DIAGNOSIS — Z6372 Alcoholism and drug addiction in family: Secondary | ICD-10-CM

## 2023-10-16 DIAGNOSIS — I10 Essential (primary) hypertension: Secondary | ICD-10-CM | POA: Diagnosis not present

## 2023-10-16 DIAGNOSIS — Z87891 Personal history of nicotine dependence: Secondary | ICD-10-CM

## 2023-10-16 DIAGNOSIS — Z6841 Body Mass Index (BMI) 40.0 and over, adult: Secondary | ICD-10-CM | POA: Diagnosis not present

## 2023-10-16 DIAGNOSIS — I890 Lymphedema, not elsewhere classified: Secondary | ICD-10-CM | POA: Diagnosis present

## 2023-10-16 DIAGNOSIS — M6281 Muscle weakness (generalized): Secondary | ICD-10-CM | POA: Diagnosis present

## 2023-10-16 DIAGNOSIS — Z811 Family history of alcohol abuse and dependence: Secondary | ICD-10-CM

## 2023-10-16 DIAGNOSIS — Z66 Do not resuscitate: Secondary | ICD-10-CM | POA: Diagnosis present

## 2023-10-16 DIAGNOSIS — Z884 Allergy status to anesthetic agent status: Secondary | ICD-10-CM

## 2023-10-16 DIAGNOSIS — J9621 Acute and chronic respiratory failure with hypoxia: Secondary | ICD-10-CM | POA: Diagnosis present

## 2023-10-16 DIAGNOSIS — Z90721 Acquired absence of ovaries, unilateral: Secondary | ICD-10-CM

## 2023-10-16 DIAGNOSIS — E039 Hypothyroidism, unspecified: Secondary | ICD-10-CM | POA: Diagnosis present

## 2023-10-16 DIAGNOSIS — Z818 Family history of other mental and behavioral disorders: Secondary | ICD-10-CM

## 2023-10-16 DIAGNOSIS — Z888 Allergy status to other drugs, medicaments and biological substances status: Secondary | ICD-10-CM

## 2023-10-16 DIAGNOSIS — Z8041 Family history of malignant neoplasm of ovary: Secondary | ICD-10-CM

## 2023-10-16 DIAGNOSIS — G4733 Obstructive sleep apnea (adult) (pediatric): Secondary | ICD-10-CM | POA: Diagnosis present

## 2023-10-16 DIAGNOSIS — E876 Hypokalemia: Principal | ICD-10-CM | POA: Diagnosis present

## 2023-10-16 DIAGNOSIS — Z825 Family history of asthma and other chronic lower respiratory diseases: Secondary | ICD-10-CM

## 2023-10-16 DIAGNOSIS — I272 Pulmonary hypertension, unspecified: Secondary | ICD-10-CM | POA: Diagnosis present

## 2023-10-16 DIAGNOSIS — J441 Chronic obstructive pulmonary disease with (acute) exacerbation: Secondary | ICD-10-CM | POA: Diagnosis present

## 2023-10-16 DIAGNOSIS — J449 Chronic obstructive pulmonary disease, unspecified: Secondary | ICD-10-CM | POA: Diagnosis present

## 2023-10-16 LAB — BASIC METABOLIC PANEL WITH GFR
Anion gap: 10 (ref 5–15)
BUN: 17 mg/dL (ref 8–23)
CO2: 31 mmol/L (ref 22–32)
Calcium: 8.7 mg/dL — ABNORMAL LOW (ref 8.9–10.3)
Chloride: 98 mmol/L (ref 98–111)
Creatinine, Ser: 1.1 mg/dL — ABNORMAL HIGH (ref 0.44–1.00)
GFR, Estimated: 54 mL/min — ABNORMAL LOW (ref >60)
Glucose, Bld: 88 mg/dL (ref 70–99)
Potassium: 2.9 mmol/L — ABNORMAL LOW (ref 3.5–5.1)
Sodium: 139 mmol/L (ref 135–145)

## 2023-10-16 LAB — CBC
HCT: 34.4 % — ABNORMAL LOW (ref 36.0–46.0)
Hemoglobin: 10.6 g/dL — ABNORMAL LOW (ref 12.0–15.0)
MCH: 28.1 pg (ref 26.0–34.0)
MCHC: 30.8 g/dL (ref 30.0–36.0)
MCV: 91.2 fL (ref 80.0–100.0)
Platelets: 183 K/uL (ref 150–400)
RBC: 3.77 MIL/uL — ABNORMAL LOW (ref 3.87–5.11)
RDW: 16.2 % — ABNORMAL HIGH (ref 11.5–15.5)
WBC: 8.5 K/uL (ref 4.0–10.5)
nRBC: 0 % (ref 0.0–0.2)

## 2023-10-16 LAB — I-STAT VENOUS BLOOD GAS, ED
Acid-Base Excess: 8 mmol/L — ABNORMAL HIGH (ref 0.0–2.0)
Bicarbonate: 35.1 mmol/L — ABNORMAL HIGH (ref 20.0–28.0)
Calcium, Ion: 1.13 mmol/L — ABNORMAL LOW (ref 1.15–1.40)
HCT: 34 % — ABNORMAL LOW (ref 36.0–46.0)
Hemoglobin: 11.6 g/dL — ABNORMAL LOW (ref 12.0–15.0)
O2 Saturation: 75 %
Potassium: 2.9 mmol/L — ABNORMAL LOW (ref 3.5–5.1)
Sodium: 141 mmol/L (ref 135–145)
TCO2: 37 mmol/L — ABNORMAL HIGH (ref 22–32)
pCO2, Ven: 62 mmHg — ABNORMAL HIGH (ref 44–60)
pH, Ven: 7.362 (ref 7.25–7.43)
pO2, Ven: 43 mmHg (ref 32–45)

## 2023-10-16 LAB — I-STAT CHEM 8, ED
BUN: 18 mg/dL (ref 8–23)
Calcium, Ion: 1.15 mmol/L (ref 1.15–1.40)
Chloride: 96 mmol/L — ABNORMAL LOW (ref 98–111)
Creatinine, Ser: 1.2 mg/dL — ABNORMAL HIGH (ref 0.44–1.00)
Glucose, Bld: 89 mg/dL (ref 70–99)
HCT: 35 % — ABNORMAL LOW (ref 36.0–46.0)
Hemoglobin: 11.9 g/dL — ABNORMAL LOW (ref 12.0–15.0)
Potassium: 2.9 mmol/L — ABNORMAL LOW (ref 3.5–5.1)
Sodium: 142 mmol/L (ref 135–145)
TCO2: 33 mmol/L — ABNORMAL HIGH (ref 22–32)

## 2023-10-16 LAB — AMMONIA: Ammonia: 19 umol/L (ref 9–35)

## 2023-10-16 LAB — BRAIN NATRIURETIC PEPTIDE: B Natriuretic Peptide: 923.4 pg/mL — ABNORMAL HIGH (ref 0.0–100.0)

## 2023-10-16 MED ORDER — FUROSEMIDE 10 MG/ML IJ SOLN
40.0000 mg | Freq: Two times a day (BID) | INTRAMUSCULAR | Status: DC
  Administered 2023-10-17: 40 mg via INTRAVENOUS
  Filled 2023-10-16: qty 4

## 2023-10-16 MED ORDER — METHYLPREDNISOLONE SODIUM SUCC 40 MG IJ SOLR
40.0000 mg | Freq: Two times a day (BID) | INTRAMUSCULAR | Status: DC
  Administered 2023-10-16: 40 mg via INTRAVENOUS
  Filled 2023-10-16: qty 1

## 2023-10-16 MED ORDER — UMECLIDINIUM BROMIDE 62.5 MCG/ACT IN AEPB
1.0000 | INHALATION_SPRAY | Freq: Every day | RESPIRATORY_TRACT | Status: DC
  Administered 2023-10-18 – 2023-10-23 (×9): 1 via RESPIRATORY_TRACT
  Filled 2023-10-16: qty 7

## 2023-10-16 MED ORDER — ONDANSETRON HCL 4 MG PO TABS: 4.0000 mg | ORAL_TABLET | Freq: Four times a day (QID) | ORAL | Status: DC | PRN

## 2023-10-16 MED ORDER — IPRATROPIUM-ALBUTEROL 0.5-2.5 (3) MG/3ML IN SOLN: 3.0000 mL | Freq: Four times a day (QID) | RESPIRATORY_TRACT | Status: DC | PRN

## 2023-10-16 MED ORDER — ENSURE PLUS HIGH PROTEIN PO LIQD
237.0000 mL | Freq: Two times a day (BID) | ORAL | Status: DC
  Administered 2023-10-18 – 2023-10-23 (×10): 237 mL via ORAL

## 2023-10-16 MED ORDER — ENOXAPARIN SODIUM 40 MG/0.4ML IJ SOSY
40.0000 mg | PREFILLED_SYRINGE | Freq: Every day | INTRAMUSCULAR | Status: DC
  Administered 2023-10-16 – 2023-10-19 (×4): 40 mg via SUBCUTANEOUS
  Filled 2023-10-16 (×4): qty 0.4

## 2023-10-16 MED ORDER — NALOXONE HCL 0.4 MG/ML IJ SOLN
0.4000 mg | Freq: Once | INTRAMUSCULAR | Status: AC
  Administered 2023-10-16: 0.4 mg via INTRAVENOUS
  Filled 2023-10-16: qty 1

## 2023-10-16 MED ORDER — POTASSIUM CHLORIDE 10 MEQ/100ML IV SOLN
10.0000 meq | INTRAVENOUS | Status: AC
  Administered 2023-10-16 (×2): 10 meq via INTRAVENOUS
  Filled 2023-10-16 (×2): qty 100

## 2023-10-16 MED ORDER — SODIUM CHLORIDE 0.9 % IV BOLUS
500.0000 mL | Freq: Once | INTRAVENOUS | Status: AC
  Administered 2023-10-16: 500 mL via INTRAVENOUS

## 2023-10-16 MED ORDER — POTASSIUM CHLORIDE 20 MEQ PO PACK
20.0000 meq | PACK | Freq: Two times a day (BID) | ORAL | Status: DC
  Administered 2023-10-17 – 2023-10-19 (×5): 20 meq via ORAL
  Filled 2023-10-16 (×6): qty 1

## 2023-10-16 MED ORDER — TIOTROPIUM BROMIDE MONOHYDRATE 18 MCG IN CAPS: 1.0000 | ORAL_CAPSULE | Freq: Every day | RESPIRATORY_TRACT | Status: DC

## 2023-10-16 MED ORDER — ONDANSETRON HCL 4 MG/2ML IJ SOLN: 4.0000 mg | Freq: Four times a day (QID) | INTRAMUSCULAR | Status: DC | PRN

## 2023-10-16 MED ORDER — DEXTROSE IN LACTATED RINGERS 5 % IV SOLN: INTRAVENOUS | Status: DC

## 2023-10-16 MED ORDER — CITALOPRAM HYDROBROMIDE 20 MG PO TABS
20.0000 mg | ORAL_TABLET | Freq: Every day | ORAL | Status: DC
  Administered 2023-10-18 – 2023-10-23 (×9): 20 mg via ORAL
  Filled 2023-10-16 (×7): qty 1

## 2023-10-16 MED ORDER — POTASSIUM CHLORIDE 10 MEQ/100ML IV SOLN
10.0000 meq | INTRAVENOUS | Status: AC
  Administered 2023-10-17 (×5): 10 meq via INTRAVENOUS
  Filled 2023-10-16 (×5): qty 100

## 2023-10-16 MED ORDER — LEVOTHYROXINE SODIUM 100 MCG PO TABS
200.0000 ug | ORAL_TABLET | Freq: Every day | ORAL | Status: DC
  Administered 2023-10-18 – 2023-10-24 (×10): 200 ug via ORAL
  Filled 2023-10-16 (×8): qty 2

## 2023-10-16 NOTE — H&P (Signed)
 History and Physical    Patient: Morgan Odonnell FMW:[id number] DOB: Aug 25, 1950 DOA: 10/16/2023 DOS: the patient was seen and examined on 10/16/2023 PCP: Pcp, No  Patient coming from: Home  Chief Complaint:  Chief Complaint  Patient presents with   Weakness   HPI: Morgan Odonnell is a 73 y.o. female with medical history significant of morbid obesity, chronic respiratory failure with hypoxia and hypercarbia, COPD, GERD, obstructive sleep apnea, hypothyroidism, obesity hypoventilation syndrome, depression, diastolic CHF, chronic back pain bilateral lymphedema essential hypertension among other things who was recently admitted to the hospital with acute on chronic respiratory failure and subsequently discharged to skilled facility.  Patient was discharged home from the skilled facility about 4 days ago.  She was brought in today by her husband secondary to altered mental status.  Patient has been difficult to arouse.  She is completely obtunded even here in the ER.  She was initially placed on BiPAP with suspicion of severe hypercarbia.  Patient was not able to tolerate the BiPAP although she appears to have improved slightly.  Patient is still drowsy so she is being admitted to the hospital on heated high flow.  No fever or chills.  No nausea vomiting or diarrhea.  Husband at bedside.  She is a DNR/DNI and does not want intubation.  At this point we are admitting the patient to progressive care for continued treatment.  Review of Systems: As mentioned in the history of present illness. All other systems reviewed and are negative. Past Medical History:  Diagnosis Date   Allergies    Anemia    Back pain    Bilateral swelling of feet    CHF (congestive heart failure) (HCC)    Chronic cough    COPD (chronic obstructive pulmonary disease) (HCC)    Depression    GERD (gastroesophageal reflux disease)    Heart murmur    Hypertension    Hypocapnia    Hypothyroid    Knee pain    Leg edema     chronic, bilateral   Lightheaded    Low back pain    Morbid obesity (HCC)    Neck pain    Obesity hypoventilation syndrome (HCC)    Oral lesion    Shortness of breath    Shortness of breath    Shoulder pain    Snoring    Past Surgical History:  Procedure Laterality Date   APPENDECTOMY     CHOLECYSTECTOMY     COLONOSCOPY N/A 06/25/2013   Procedure: COLONOSCOPY;  Surgeon: Belvie JONETTA Just, MD;  Location: [facility];  Service: Endoscopy;  Laterality: N/A;   ECTOPIC PREGNANCY SURGERY     Left Ovary Removed     MENISCUS REPAIR     TONSILLECTOMY     TUBAL LIGATION     Social History:  reports that she quit smoking about 8 years ago. Her smoking use included cigarettes. She started smoking about 53 years ago. She has a 90 pack-year smoking history. She has never used smokeless tobacco. She reports that she does not drink alcohol and does not use drugs.  Allergies  Allergen Reactions   Gabapentin Other (See Comments)    slept for 2 days   Naloxone  Other (See Comments)    Allergy not listed on MAR    Novocain [Procaine] Other (See Comments)    intolerance    Family History  Problem Relation Age of Onset   Ovarian cancer Mother    Lung disease Mother  Heart failure Mother    Hypertension Mother    Heart disease Mother    Seizures Mother    Cancer Mother    Depression Mother    Sleep apnea Mother    Alcoholism Mother    Eating disorder Mother    Obesity Mother    Hypertension Father    Eating disorder Father    Obesity Father    Anxiety disorder Father    Lymphoma Brother    Ovarian cancer Maternal Aunt    Asthma Daughter     Prior to Admission medications   Medication Sig Start Date End Date Taking? Authorizing Provider  baclofen  (LIORESAL ) 10 MG tablet Take 10 mg by mouth every 8 (eight) hours. 09/29/23  Yes [provider]  citalopram  (CELEXA ) 20 MG tablet Take 1 tablet (20 mg total) by mouth daily. 07/04/17  Yes Rojelio Nest, DO  furosemide  (LASIX )  40 MG tablet Takes 2 tables in morning and 1 tab in evening Patient taking differently: Take 40 mg by mouth daily. 09/01/23  Yes Jadine Toribio SQUIBB, MD  albuterol  (PROVENTIL  HFA;VENTOLIN  HFA) 108 (90 BASE) MCG/ACT inhaler Inhale 2 puffs into the lungs every 6 (six) hours as needed for wheezing.     [provider]  aspirin  EC 81 MG EC tablet Take 1 tablet (81 mg total) by mouth daily. 07/05/17   Rojelio Nest, DO  budesonide -formoterol  (SYMBICORT ) 160-4.5 MCG/ACT inhaler INHALE TWO puffs into lung TWICE DAILY 07/09/22   Hunsucker, Donnice SAUNDERS, MD  cholecalciferol (VITAMIN D3) 25 MCG (1000 UNIT) tablet Take 5,000 Units by mouth daily.    [provider]  diclofenac  sodium (VOLTAREN ) 1 % GEL Apply 1 application topically 4 (four) times daily. Applied to knees, shoulders, wrists, and ankles    [provider]  ipratropium-albuterol  (DUONEB) 0.5-2.5 (3) MG/3ML SOLN Take 3 mLs by nebulization every 6 (six) hours as needed (wheezing with dyspnea). 09/26/23   Rizwan, Saima, MD  levothyroxine  (SYNTHROID ) 200 MCG tablet Take 1 tablet (200 mcg total) by mouth daily before breakfast. 03/24/19   Prentiss Frieze, DO  naloxegol oxalate (MOVANTIK) 25 MG TABS tablet Take 25 mg by mouth daily.    [provider]  polyethylene glycol (MIRALAX  / GLYCOLAX ) 17 g packet Take 17 g by mouth daily as needed for mild constipation. 09/01/23   Jadine Toribio SQUIBB, MD  potassium chloride  (KLOR-CON ) 20 MEQ packet Take 20 mEq by mouth 2 (two) times daily. 09/01/23   Jadine Toribio SQUIBB, MD  pregabalin  (LYRICA ) 75 MG capsule Take 75 mg by mouth 2 (two) times daily. 07/15/23   [provider]  Spacer/Aero-Holding Chambers (AEROCHAMBER Z-STAT PLUS [place]) MISC Use as directed 05/10/15   Nestor, Jennings E, MD  tiotropium (SPIRIVA  HANDIHALER) 18 MCG inhalation capsule Place 1 capsule (18 mcg total) into inhaler and inhale daily. 07/09/22   Hunsucker, Donnice SAUNDERS, MD  urea  (URE-NA) 15 g PACK oral packet Take  15 g by mouth 2 (two) times daily. 09/01/23   Jadine Toribio SQUIBB, MD    Physical Exam: Vitals:   10/16/23 2101 10/16/23 2130 10/16/23 2200 10/16/23 2202  BP: (!) 145/67 (!) 136/99 (!) 112/54   Pulse: (!) 51 (!) 51 (!) 48   Resp: 16 (!) 23 18   Temp:    98.4 F (36.9 C)  TempSrc:    Axillary  SpO2: 100% 99% 100%    Constitutional: Morbidly obese, obtunded, unkempt, poorly responsive Eyes: PERRL, lids and conjunctivae normal ENMT: Mucous membranes are dry. Posterior pharynx  clear of any exudate or lesions.Normal dentition.  Neck: normal, supple, no masses, no thyromegaly Respiratory: clear to auscultation bilaterally, no wheezing, no crackles. Normal respiratory effort. No accessory muscle use.  Cardiovascular: Sinus bradycardia, no murmurs / rubs / gallops. No extremity edema. 2+ pedal pulses. No carotid bruits.  Abdomen: no tenderness, no masses palpated. No hepatosplenomegaly. Bowel sounds positive.  Musculoskeletal: Good range of motion, no joint swelling or tenderness, Skin: no rashes, lesions, ulcers. No induration Neurologic: CN 2-12 grossly intact. Sensation intact, DTR normal. Strength 5/5 in all 4.  Psychiatric: Obtunded, drowsy,  Data Reviewed:  Temperature 97.5, blood pressure 145/67, pulse 110.  23 oxygen  sat 88% on room air potassium 2.9, chloride 96, creatinine 1.2, calcium  9.1.  White count 8.5, hemoglobin 11.6 EKG showed sinus tachycardia chest x-ray of which showed findings compatible CHF right arm pulmonary edema head CT without contrast showed no acute intracranial abnormality there is interval mild atrophy and chronic small vessel ischemic changes  Assessment and Plan:  #1 acute metabolic encephalopathy: Suspected due to hypoxia and hypercarbia.  VBG showed pH of 7.3 pCO2 is 62.  Patient has not tolerated BiPAP well.  Continue with high flow oxygen .  Initiate steroids and breathing treatments.  CPAP at night for her sleep apnea.  #2 hypokalemia: Continue to  aggressively replete.  Patient otherwise stable.  #3 COPD: With acute exacerbation.  Continue steroid breathing treatment  #4 acute on chronic diastolic CHF: Appears compensated.  BNP is 923.  Would likely need to have diuresis.  Continue home regimen.  #5 essential hypertension: Resume home blood pressure medications..  #6 obstructive sleep apnea: Patient not tolerated BiPAP today will see if she will tolerate CPAP.  #7 bilateral lymphedema: Continue with lymphedema care.     Advance Care Planning:   Code Status: Do not attempt resuscitation (DNR) PRE-ARREST INTERVENTIONS DESIRED   Consults: None  Family Communication: Husband at bedside  Severity of Illness: The appropriate patient status for this patient is INPATIENT. Inpatient status is judged to be reasonable and necessary in order to provide the required intensity of service to ensure the patient's safety. The patient's presenting symptoms, physical exam findings, and initial radiographic and laboratory data in the context of their chronic comorbidities is felt to place them at high risk for further clinical deterioration. Furthermore, it is not anticipated that the patient will be medically stable for discharge from the hospital within 2 midnights of admission.   * I certify that at the point of admission it is my clinical judgment that the patient will require inpatient hospital care spanning beyond 2 midnights from the point of admission due to high intensity of service, high risk for further deterioration and high frequency of surveillance required.*  AuthorBETHA SIM KNOLL, MD 10/16/2023 10:18 PM  For on call review [email or web address].

## 2023-10-16 NOTE — ED Notes (Signed)
 CCMD called.

## 2023-10-16 NOTE — Progress Notes (Signed)
 Pt transported from ED room 27 to CT & back on BIPAP without any complications.

## 2023-10-16 NOTE — ED Provider Notes (Signed)
 [place] EMERGENCY DEPARTMENT AT [facility] Provider Note   CSN: [id number] Arrival date & time: 10/16/23  1658     Patient presents with: Weakness   Morgan Odonnell is a 73 y.o. female w/ hx of CHF, obesity, OSA, chronic hypoxic and hypercapneic respiratory failure, here from home by EMS with somnolence, confusion.  Her husband at bedside reports that patient has been difficult to rouse all day.  She tends to just sleep all day even at her SNF and home, but never this somnolent.  He is not certain what medications she takes or if she's taking them.  She was just discharged home from SNF 3 days ago.  Prior to that she was hospitalized for resp failure, noted hx of noncompliance with bipap, and possible PNA treated with antibiotics.  DNRI/DNI   HPI     Prior to Admission medications   Medication Sig Start Date End Date Taking? Authorizing Provider  baclofen  (LIORESAL ) 10 MG tablet Take 10 mg by mouth every 8 (eight) hours. 09/29/23  Yes [provider]  citalopram  (CELEXA ) 20 MG tablet Take 1 tablet (20 mg total) by mouth daily. 07/04/17  Yes Rojelio Nest, DO  furosemide  (LASIX ) 40 MG tablet Takes 2 tables in morning and 1 tab in evening Patient taking differently: Take 40 mg by mouth daily. 09/01/23  Yes Jadine Toribio SQUIBB, MD  albuterol  (PROVENTIL  HFA;VENTOLIN  HFA) 108 (90 BASE) MCG/ACT inhaler Inhale 2 puffs into the lungs every 6 (six) hours as needed for wheezing.     [provider]  aspirin  EC 81 MG EC tablet Take 1 tablet (81 mg total) by mouth daily. 07/05/17   Rojelio Nest, DO  budesonide -formoterol  (SYMBICORT ) 160-4.5 MCG/ACT inhaler INHALE TWO puffs into lung TWICE DAILY 07/09/22   Hunsucker, Donnice SAUNDERS, MD  cholecalciferol (VITAMIN D3) 25 MCG (1000 UNIT) tablet Take 5,000 Units by mouth daily.    [provider]  diclofenac  sodium (VOLTAREN ) 1 % GEL Apply 1 application topically 4 (four) times daily. Applied to knees, shoulders,  wrists, and ankles    [provider]  ipratropium-albuterol  (DUONEB) 0.5-2.5 (3) MG/3ML SOLN Take 3 mLs by nebulization every 6 (six) hours as needed (wheezing with dyspnea). 09/26/23   Rizwan, Saima, MD  levothyroxine  (SYNTHROID ) 200 MCG tablet Take 1 tablet (200 mcg total) by mouth daily before breakfast. 03/24/19   Prentiss Frieze, DO  naloxegol oxalate (MOVANTIK) 25 MG TABS tablet Take 25 mg by mouth daily.    [provider]  polyethylene glycol (MIRALAX  / GLYCOLAX ) 17 g packet Take 17 g by mouth daily as needed for mild constipation. 09/01/23   Jadine Toribio SQUIBB, MD  potassium chloride  (KLOR-CON ) 20 MEQ packet Take 20 mEq by mouth 2 (two) times daily. 09/01/23   Jadine Toribio SQUIBB, MD  pregabalin  (LYRICA ) 75 MG capsule Take 75 mg by mouth 2 (two) times daily. 07/15/23   [provider]  Spacer/Aero-Holding Chambers (AEROCHAMBER Z-STAT PLUS [place]) MISC Use as directed 05/10/15   Nestor, Jennings E, MD  tiotropium (SPIRIVA  HANDIHALER) 18 MCG inhalation capsule Place 1 capsule (18 mcg total) into inhaler and inhale daily. 07/09/22   Hunsucker, Donnice SAUNDERS, MD  urea  (URE-NA) 15 g PACK oral packet Take 15 g by mouth 2 (two) times daily. 09/01/23   Jadine Toribio SQUIBB, MD    Allergies: Gabapentin, Naloxone , and Novocain [procaine]    Review of Systems  Updated Vital Signs BP (!) 139/95 (BP Location: Left Arm)   Pulse (!) 53  Temp (!) 97.5 F (36.4 C) (Axillary)   Resp 17   Ht 5' 2 (1.575 m)   Wt 118.6 kg   SpO2 95%   BMI 47.82 kg/m   Physical Exam Constitutional:      Appearance: She is obese.     Comments: Somnolent, difficult to rouse  HENT:     Head: Normocephalic and atraumatic.  Eyes:     Conjunctiva/sclera: Conjunctivae normal.     Pupils: Pupils are equal, round, and reactive to light.  Cardiovascular:     Rate and Rhythm: Regular rhythm. Bradycardia present.  Pulmonary:     Effort: Pulmonary effort is normal. No respiratory distress.     Comments:  98% on NRB face mask Poor respiratory effort and air movement bilaterally Abdominal:     General: There is no distension.     Tenderness: There is no abdominal tenderness.  Skin:    General: Skin is warm and dry.  Neurological:     Comments: Awakens to vigorous sternal rub only (jerks awake, then falls asleep)     (all labs ordered are listed, but only abnormal results are displayed) Labs Reviewed  BASIC METABOLIC PANEL WITH GFR - Abnormal; Notable for the following components:      Result Value   Potassium 2.9 (*)    Creatinine, Ser 1.10 (*)    Calcium  8.7 (*)    GFR, Estimated 54 (*)    All other components within normal limits  CBC - Abnormal; Notable for the following components:   RBC 3.77 (*)    Hemoglobin 10.6 (*)    HCT 34.4 (*)    RDW 16.2 (*)    All other components within normal limits  BRAIN NATRIURETIC PEPTIDE - Abnormal; Notable for the following components:   B Natriuretic Peptide 923.4 (*)    All other components within normal limits  I-STAT VENOUS BLOOD GAS, ED - Abnormal; Notable for the following components:   pCO2, Ven 62.0 (*)    Bicarbonate 35.1 (*)    TCO2 37 (*)    Acid-Base Excess 8.0 (*)    Potassium 2.9 (*)    Calcium , Ion 1.13 (*)    HCT 34.0 (*)    Hemoglobin 11.6 (*)    All other components within normal limits  I-STAT CHEM 8, ED - Abnormal; Notable for the following components:   Potassium 2.9 (*)    Chloride 96 (*)    Creatinine, Ser 1.20 (*)    TCO2 33 (*)    Hemoglobin 11.9 (*)    HCT 35.0 (*)    All other components within normal limits  AMMONIA  URINALYSIS, ROUTINE W REFLEX MICROSCOPIC  CBC  CREATININE, SERUM  CBC  COMPREHENSIVE METABOLIC PANEL WITH GFR    EKG: EKG Interpretation Date/Time:  Thursday October 16 2023 17:30:47 EDT Ventricular Rate:  50 PR Interval:  232 QRS Duration:  104 QT Interval:  521 QTC Calculation: 476 R Axis:   139  Text Interpretation: Sinus rhythm Prolonged PR interval RVH with secondary  repolarization abnrm Confirmed by Cottie Cough [id number]) on 10/16/2023 5:48:16 PM  Radiology: CT Head Wo Contrast Result Date: 10/16/2023 CLINICAL DATA:  Mental status change EXAM: CT HEAD WITHOUT CONTRAST TECHNIQUE: Contiguous axial images were obtained from the base of the skull through the vertex without intravenous contrast. RADIATION DOSE REDUCTION: This exam was performed according to the departmental dose-optimization program which includes automated exposure control, adjustment of the mA and/or kV according to patient size and/or use of  iterative reconstruction technique. COMPARISON:  06/26/2017 FINDINGS: Brain: No acute territorial infarction, hemorrhage or intracranial mass. Interval mild atrophy. Mild chronic small vessel ischemic changes of the white matter. Interval ventricular enlargement, likely due to atrophy progression Vascular: No hyperdense vessels.  No unexpected calcification Skull: Normal. Negative for fracture or focal lesion. Sinuses/Orbits: No acute finding. Other: None IMPRESSION: 1. No CT evidence for acute intracranial abnormality. 2. Interval mild atrophy and chronic small vessel ischemic changes of the white matter. Interval mild ventricular enlargement, likely due to atrophy progression. Electronically Signed   By: Luke Bun M.D.   On: 10/16/2023 21:41   DG Chest Portable 1 View Result Date: 10/16/2023 CLINICAL DATA:  Shortness of breath EXAM: PORTABLE CHEST 1 VIEW COMPARISON:  Radiograph and CT 09/19/2023 FINDINGS: Similar cardiomegaly. Aortic atherosclerotic calcification. Pulmonary vascular congestion. Interstitial coarsening and hazy airspace opacities in the lower lungs. Question small left pleural effusion. No pneumothorax. IMPRESSION: 1. Findings most compatible with CHF right and pulmonary edema. Electronically Signed   By: Norman Gatlin M.D.   On: 10/16/2023 18:53     .Critical Care  Performed by: Cottie Donnice PARAS, MD Authorized by: Cottie Donnice PARAS, MD    Critical care provider statement:    Critical care time (minutes):  45   Critical care time was exclusive of:  Separately billable procedures and treating other patients   Critical care was necessary to treat or prevent imminent or life-threatening deterioration of the following conditions:  Respiratory failure   Critical care was time spent personally by me on the following activities:  Ordering and performing treatments and interventions, ordering and review of laboratory studies, ordering and review of radiographic studies, pulse oximetry, review of old charts, examination of patient and evaluation of patient's response to treatment Comments:     bipap    Medications Ordered in the ED  levothyroxine  (SYNTHROID ) tablet 200 mcg (has no administration in time range)  citalopram  (CELEXA ) tablet 20 mg (has no administration in time range)  potassium chloride  (KLOR-CON ) packet 20 mEq (has no administration in time range)  ipratropium-albuterol  (DUONEB) 0.5-2.5 (3) MG/3ML nebulizer solution 3 mL (has no administration in time range)  enoxaparin  (LOVENOX ) injection 40 mg (has no administration in time range)  ondansetron  (ZOFRAN ) tablet 4 mg (has no administration in time range)    Or  ondansetron  (ZOFRAN ) injection 4 mg (has no administration in time range)  dextrose  5 % in lactated ringers  infusion (has no administration in time range)  methylPREDNISolone  sodium succinate (SOLU-MEDROL ) 40 mg/mL injection 40 mg (has no administration in time range)  umeclidinium bromide  (INCRUSE ELLIPTA ) 62.5 MCG/ACT 1 puff (has no administration in time range)  sodium chloride  0.9 % bolus 500 mL (0 mLs Intravenous Stopped 10/16/23 1944)  naloxone  (NARCAN ) injection 0.4 mg (0.4 mg Intravenous Given 10/16/23 1809)  potassium chloride  10 mEq in 100 mL IVPB (0 mEq Intravenous Stopped 10/16/23 2230)    Clinical Course as of 10/16/23 2258  Thu Oct 16, 2023  1727 RT called to initiate bipap [MT]  1754 Venous gas  without significant pc02 elevation [MT]  1811 Pt on bipap now, still arouses ('jerks awake') to sternal rubing, then falls asleep [MT]  1927 Confirmed DNR/DNI status in discussion with her husband, based on her own wishes in the hospital [MT]    Clinical Course User Index [MT] Cottie Donnice PARAS, MD  Medical Decision Making Amount and/or Complexity of Data Reviewed Labs: ordered. Radiology: ordered. ECG/medicine tests: ordered.  Risk Prescription drug management. Decision regarding hospitalization.   This patient presents to the ED with concern for AMS, somnolence. This involves an extensive number of treatment options, and is a complaint that carries with it a high risk of complications and morbidity.  The differential diagnosis includes hypercapneic respiratory failure vs infection vs CVA vs metabolic derangement vs other  Co-morbidities that complicate the patient evaluation: hx of OSA, obesity, hypercapnea  Additional history obtained from EMS, husband  External records from outside source obtained and reviewed including hospital discharge summary most recently  I ordered and personally interpreted labs.  The pertinent results include:  K 2.9, pH normal, PCO2 and bicarb largely unremarkable, CBC normal, BNP 923 near baseline  I ordered imaging studies including CT head, dg chest I independently visualized and interpreted imaging which showed no acute stroke; CHF/pulm edema pattern I agree with the radiologist interpretation  The patient was maintained on a cardiac monitor.  I personally viewed and interpreted the cardiac monitored which showed an underlying rhythm of: borderline bradycardia  Per my interpretation the patient's ECG shows no acute ischemic findings  I ordered medication including IV K, narcan , bipap, IV fluids  I have reviewed the patients home medicines and have made adjustments as needed  Test Considered: lower suspicion for  acute PE with this presentation  After the interventions noted above, I reevaluated the patient and found that they have: stayed the same   Social Determinants of Health:  Goals of care discussion with husband confirms patient's DNR/DNI status. Given this fact, we will not intubate for diminished GCS, and try to manage as best as possible with bipap and oral oxygen  mask.  She does not appear severely hypercapneic.  We were not able to get urine from cath at bedside - will need to retry, but no clear evidence of sepsis at this time.  Dispostion:  After consideration of the diagnostic results and the patients response to treatment, I feel that the patent would benefit from medical admission       Final diagnoses:  Hypokalemia  Altered mental status, unspecified altered mental status type    ED Discharge Orders     None          Cottie Donnice PARAS, MD 10/16/23 2258

## 2023-10-16 NOTE — Plan of Care (Addendum)
 Pt received to room w/o incident. Alert, unable to assess orientation. VSS on 10L [place]. Abd soft, rounded. Redness noted under breasts, buttocks, BLE. 3+ edema to BLE, intact blisters present. Skin dry, scaling, scattered bruising to extremities. Safety precautions in place, call light within reach. Family at bedside.   Problem: Education: Goal: Knowledge of General Education information will improve Description: Including pain rating scale, medication(s)/side effects and non-pharmacologic comfort measures Outcome: Not Progressing   Problem: Health Behavior/Discharge Planning: Goal: Ability to manage health-related needs will improve Outcome: Not Progressing   Problem: Clinical Measurements: Goal: Ability to maintain clinical measurements within normal limits will improve Outcome: Not Progressing Goal: Will remain free from infection Outcome: Not Progressing Goal: Diagnostic test results will improve Outcome: Not Progressing Goal: Respiratory complications will improve Outcome: Not Progressing Goal: Cardiovascular complication will be avoided Outcome: Not Progressing   Problem: Activity: Goal: Risk for activity intolerance will decrease Outcome: Not Progressing   Problem: Nutrition: Goal: Adequate nutrition will be maintained Outcome: Not Progressing   Problem: Coping: Goal: Level of anxiety will decrease Outcome: Not Progressing   Problem: Elimination: Goal: Will not experience complications related to bowel motility Outcome: Not Progressing Goal: Will not experience complications related to urinary retention Outcome: Not Progressing   Problem: Pain Managment: Goal: General experience of comfort will improve and/or be controlled Outcome: Not Progressing   Problem: Safety: Goal: Ability to remain free from injury will improve Outcome: Not Progressing   Problem: Skin Integrity: Goal: Risk for impaired skin integrity will decrease Outcome: Not Progressing

## 2023-10-16 NOTE — Progress Notes (Signed)
   10/16/23 2133  Therapy Vitals  Patient Position (if appropriate) Sitting  Respiratory Assessment  Assessment Type Assess only  Respiratory Pattern Regular  Chest Assessment Chest expansion symmetrical  Cough Non-productive  Bilateral Breath Sounds Clear;Diminished  R Upper  Breath Sounds Clear  L Upper Breath Sounds Clear  R Lower Breath Sounds Diminished  L Lower Breath Sounds Diminished  Oxygen  Therapy/Pulse Ox  O2 Device (S)  Room Air (Pt taken off of BIPAP due to aspiration.)

## 2023-10-16 NOTE — Progress Notes (Signed)
   10/16/23 1922  BiPAP/CPAP/SIPAP  BiPAP/CPAP/SIPAP Pt Type Adult  BiPAP/CPAP/SIPAP SERVO  Mask Type Full face mask  Mask Size Large  Set Rate 15 breaths/min  Respiratory Rate 16 breaths/min  IPAP 18 cmH20  EPAP 8 cmH2O  Pressure Support 10 cmH20  PEEP 8 cmH20  FiO2 (%) 40 %  Minute Ventilation 10.1  Leak 40  Peak Inspiratory Pressure (PIP) 19  Tidal Volume (Vt) 458  Patient Home Machine No  Patient Home Mask No  Patient Home Tubing No  Auto Titrate No  Press High Alarm 25 cmH2O  Press Low Alarm 5 cmH2O  Device Plugged into RED Power Outlet Yes  BiPAP/CPAP /SiPAP Vitals  Pulse Rate (!) 57  Resp 15  Bilateral Breath Sounds Clear;Diminished  MEWS Score/Color  MEWS Score 2  MEWS Score Color Yellow

## 2023-10-16 NOTE — ED Triage Notes (Signed)
 Pt arrives via EMS from home reports that patient was recently discharged from [facility] approximately 3 days prior but family is concerned that she has severely declined at home. She stays in her chair at all times, is incontinent of bowel and bladder and has decreased appetite. C/o further decease of mental status yesterday with pt becoming obtunded; responsive to voice

## 2023-10-17 ENCOUNTER — Ambulatory Visit (HOSPITAL_COMMUNITY): Admission: RE | Admit: 2023-10-17 | Source: Ambulatory Visit

## 2023-10-17 ENCOUNTER — Ambulatory Visit: Admitting: Vascular Surgery

## 2023-10-17 DIAGNOSIS — J9622 Acute and chronic respiratory failure with hypercapnia: Secondary | ICD-10-CM

## 2023-10-17 DIAGNOSIS — Z6841 Body Mass Index (BMI) 40.0 and over, adult: Secondary | ICD-10-CM

## 2023-10-17 DIAGNOSIS — I5033 Acute on chronic diastolic (congestive) heart failure: Secondary | ICD-10-CM | POA: Diagnosis not present

## 2023-10-17 DIAGNOSIS — J9621 Acute and chronic respiratory failure with hypoxia: Secondary | ICD-10-CM

## 2023-10-17 DIAGNOSIS — E876 Hypokalemia: Secondary | ICD-10-CM | POA: Diagnosis not present

## 2023-10-17 DIAGNOSIS — G9341 Metabolic encephalopathy: Secondary | ICD-10-CM | POA: Diagnosis not present

## 2023-10-17 LAB — COMPREHENSIVE METABOLIC PANEL WITH GFR
ALT: 15 U/L (ref 0–44)
AST: 27 U/L (ref 15–41)
Albumin: 2.3 g/dL — ABNORMAL LOW (ref 3.5–5.0)
Alkaline Phosphatase: 75 U/L (ref 38–126)
Anion gap: 8 (ref 5–15)
BUN: 16 mg/dL (ref 8–23)
CO2: 32 mmol/L (ref 22–32)
Calcium: 9 mg/dL (ref 8.9–10.3)
Chloride: 99 mmol/L (ref 98–111)
Creatinine, Ser: 1.12 mg/dL — ABNORMAL HIGH (ref 0.44–1.00)
GFR, Estimated: 53 mL/min — ABNORMAL LOW (ref >60)
Glucose, Bld: 99 mg/dL (ref 70–99)
Potassium: 3.5 mmol/L (ref 3.5–5.1)
Sodium: 139 mmol/L (ref 135–145)
Total Bilirubin: 1.3 mg/dL — ABNORMAL HIGH (ref 0.0–1.2)
Total Protein: 8.3 g/dL — ABNORMAL HIGH (ref 6.5–8.1)

## 2023-10-17 LAB — CBC
HCT: 34.7 % — ABNORMAL LOW (ref 36.0–46.0)
Hemoglobin: 10.5 g/dL — ABNORMAL LOW (ref 12.0–15.0)
MCH: 27.4 pg (ref 26.0–34.0)
MCHC: 30.3 g/dL (ref 30.0–36.0)
MCV: 90.6 fL (ref 80.0–100.0)
Platelets: 194 K/uL (ref 150–400)
RBC: 3.83 MIL/uL — ABNORMAL LOW (ref 3.87–5.11)
RDW: 16.4 % — ABNORMAL HIGH (ref 11.5–15.5)
WBC: 7.3 K/uL (ref 4.0–10.5)
nRBC: 0 % (ref 0.0–0.2)

## 2023-10-17 LAB — CREATININE, SERUM
Creatinine, Ser: 1.01 mg/dL — ABNORMAL HIGH (ref 0.44–1.00)
GFR, Estimated: 60 mL/min — ABNORMAL LOW (ref >60)

## 2023-10-17 LAB — MRSA NEXT GEN BY PCR, NASAL: MRSA by PCR Next Gen: DETECTED — AB

## 2023-10-17 MED ORDER — SODIUM CHLORIDE 0.9 % IV BOLUS
500.0000 mL | Freq: Once | INTRAVENOUS | Status: AC
  Administered 2023-10-17: 500 mL via INTRAVENOUS

## 2023-10-17 MED ORDER — CARVEDILOL 6.25 MG PO TABS: 6.2500 mg | ORAL_TABLET | Freq: Two times a day (BID) | ORAL | Status: DC

## 2023-10-17 MED ORDER — HYDRALAZINE HCL 20 MG/ML IJ SOLN: 10.0000 mg | Freq: Four times a day (QID) | INTRAMUSCULAR | Status: DC | PRN

## 2023-10-17 MED ORDER — FUROSEMIDE 10 MG/ML IJ SOLN
60.0000 mg | Freq: Three times a day (TID) | INTRAMUSCULAR | Status: DC
  Administered 2023-10-17 (×2): 60 mg via INTRAVENOUS
  Filled 2023-10-17 (×3): qty 6

## 2023-10-17 MED ORDER — FUROSEMIDE 10 MG/ML IJ SOLN: 60.0000 mg | Freq: Two times a day (BID) | INTRAMUSCULAR | Status: DC

## 2023-10-17 NOTE — Evaluation (Signed)
 Physical Therapy Evaluation Patient Details Name: Morgan Odonnell MRN: [id number] DOB: [birth year] Today's Date: 10/17/2023  History of Present Illness  73 y.o female admitted 10/16/23 with AMS and Rt pulmonary edema. PMH: admission 7/11-7/18 with D/C to SNF, return home 8/3. CHF, OSA, COPD, hypothyroidism, obesity, chronic respiratory failure, depression,chronic back pain, HTN  Clinical Impression  Pt oriented to self and spouse stating she prefers to be called [name]. Per spouse pt non-ambulatory for a year but was transferring to [facility] with +1 assist prior to admission in July and since D/C home from [facility] has been unable to get out of recliner with family assist and using bed pan in recliner. Spouse agreeable to post acute rehab, PCA and hoyer lift for home. Spouse does not have additional assist and pt currently mod-max +2 for any transfers attempts. Pt with progressive weakness and decline who will benefit from acute therapy to maximize mobility and safety to decrease burden of care. Encouraged rolling with staff and spouse to encourage LB and UB HEP.   SPO2 93-98% on 8L      If plan is discharge home, recommend the following: Two people to help with walking and/or transfers;Assist for transportation;Supervision due to cognitive status;Two people to help with bathing/dressing/bathroom;Direct supervision/assist for financial management;Direct supervision/assist for medications management   Can travel by private vehicle   No    Equipment Recommendations Hoyer lift  Recommendations for Other Services       Functional Status Assessment Patient has had a recent decline in their functional status and/or demonstrates limited ability to make significant improvements in function in a reasonable and predictable amount of time     Precautions / Restrictions Precautions Precautions: Fall;Other (comment) Recall of Precautions/Restrictions: Impaired Precaution/Restrictions Comments: Fragile skin,  edema, weeping LB, O2 dep      Mobility  Bed Mobility Overal bed mobility: Needs Assistance Bed Mobility: Rolling Rolling: Mod assist, Max assist, +2 for physical assistance         General bed mobility comments: mod assit to roll to left with rail and cues, max +2 assist to roll right with multimodal cues and physical assist of pad to move pt. unable to successfully move legs to attempt pivot toward EOB    Transfers                   General transfer comment: will require lift at this time    Ambulation/Gait                  Stairs            Wheelchair Mobility     Tilt Bed    Modified Rankin (Stroke Patients Only)       Balance                                             Pertinent Vitals/Pain Pain Assessment Pain Score: 5  Pain Location: legs with movement Pain Descriptors / Indicators: Discomfort, Aching Pain Intervention(s): Limited activity within patient's tolerance, Monitored during session, Repositioned    Home Living Family/patient expects to be discharged to:: Private residence Living Arrangements: Spouse/significant other Available Help at Discharge: Family;Available 24 hours/day Type of Home: House Home Access: Ramped entrance       Home Layout: One level Home Equipment: Agricultural consultant (2 wheels);Cane - single point;Wheelchair - manual;Shower seat;Grab bars - tub/shower;Adaptive equipment;Hand  held shower head;Hospital bed;Toilet riser Additional Comments: O2 at baseline    Prior Function Prior Level of Function : Needs assist       Physical Assist : Mobility (physical);ADLs (physical) Mobility (physical): Transfers ADLs (physical): Bathing;Dressing;Toileting;IADLs Mobility Comments: Unable to stand/walk. Been using recliner to sleep. WC after discharge from facility and needed help transferring her to recliner with +2. has not transferred out of recliner since return home ADLs Comments: Toileting  using bedpan by rolling in recliner with max assist of spouse, sponge bathe in chair     Extremity/Trunk Assessment   Upper Extremity Assessment Upper Extremity Assessment: Generalized weakness;Defer to OT evaluation    Lower Extremity Assessment RLE Deficits / Details: 2-/5 strength with ROM limited by body habitus. hip with slight lateral external rotation, unable to bend knee successfully in supine LLE Deficits / Details: hip external rotation maintained throughout with limited ability to move hip and leg with assist, body habitus impairing ROM. able to shift leg with max assist grossly 1-2-/5 strength    Cervical / Trunk Assessment Cervical / Trunk Assessment: Other exceptions Cervical / Trunk Exceptions: increased body habitus  Communication   Communication Communication: Impaired Factors Affecting Communication: Reduced clarity of speech    Cognition Arousal: Lethargic Behavior During Therapy: Flat affect   PT - Cognitive impairments: Orientation, Awareness, Memory, Problem solving, Safety/Judgement                       PT - Cognition Comments: pt awake but with tendency to close eyes without direct interaction with pt. she is oriented to self and spouse only and unable to answer questions. following commands ,25% of the time Following commands: Impaired Following commands impaired: Follows one step commands inconsistently     Cueing Cueing Techniques: Verbal cues, Gestural cues, Tactile cues, Visual cues     General Comments      Exercises General Exercises - Upper Extremity Shoulder Flexion: AAROM, Right, Seated, Strengthening (3 reps) General Exercises - Lower Extremity Hip ABduction/ADduction: AAROM, Right, Supine, 5 reps Straight Leg Raises: PROM, 5 reps, Right, Supine   Assessment/Plan    PT Assessment Patient needs continued PT services  PT Problem List Decreased mobility;Decreased activity tolerance;Decreased strength;Cardiopulmonary status  limiting activity;Obesity;Decreased skin integrity;Pain;Decreased cognition;Decreased knowledge of use of DME;Decreased balance;Decreased safety awareness       PT Treatment Interventions DME instruction;Balance training;Functional mobility training;Therapeutic activities;Therapeutic exercise;Patient/family education;Wheelchair mobility training;Neuromuscular re-education;Cognitive remediation    PT Goals (Current goals can be found in the Care Plan section)  Acute Rehab PT Goals Patient Stated Goal: get some help PT Goal Formulation: With family Time For Goal Achievement: 10/31/23 Potential to Achieve Goals: Poor    Frequency Min 1X/week     Co-evaluation               AM-PAC PT 6 Clicks Mobility  Outcome Measure Help needed turning from your back to your side while in a flat bed without using bedrails?: Total Help needed moving from lying on your back to sitting on the side of a flat bed without using bedrails?: Total Help needed moving to and from a bed to a chair (including a wheelchair)?: Total Help needed standing up from a chair using your arms (e.g., wheelchair or bedside chair)?: Total Help needed to walk in hospital room?: Total Help needed climbing 3-5 steps with a railing? : Total 6 Click Score: 6    End of Session Equipment Utilized During Treatment: Oxygen  Activity Tolerance: Patient limited  by fatigue Patient left: in bed;with call bell/phone within reach;with family/visitor present;with bed alarm set Nurse Communication: Mobility status;Need for lift equipment PT Visit Diagnosis: Muscle weakness (generalized) (M62.81);Difficulty in walking, not elsewhere classified (R26.2)    Time: 9150-9086 PT Time Calculation (min) (ACUTE ONLY): 24 min   Charges:   PT Evaluation $PT Eval Moderate Complexity: 1 Mod   PT General Charges $$ ACUTE PT VISIT: 1 Visit         Lenoard SQUIBB, PT Acute Rehabilitation Services Office: [phone]   Lenoard NOVAK  Hasna Stefanik 10/17/2023, 9:54 AM

## 2023-10-17 NOTE — Evaluation (Signed)
 Speech Language Pathology Evaluation Patient Details Name: Morgan Odonnell MRN: [id number] DOB: 09/13/1950 Today's Date: 10/17/2023 Time: 8974-8961 SLP Time Calculation (min) (ACUTE ONLY): 13 min  Problem List:  Patient Active Problem List   Diagnosis Date Noted   Acute metabolic encephalopathy 10/16/2023   Hypokalemia 10/16/2023   Acute on chronic heart failure with preserved ejection fraction (HFpEF) (HCC) 09/19/2023   Hyponatremia 08/29/2023   Dysphagia 08/29/2023   Palliative care encounter 08/29/2023   Counseling and coordination of care 08/29/2023   Goals of care, counseling/discussion 08/29/2023   Heart failure (HCC) 08/29/2023   Need for emotional support 08/29/2023   Multifocal pneumonia 08/26/2023   Normocytic anemia 08/16/2023   Precordial pain 02/21/2021   Exertional dyspnea 02/21/2021   Acute on chronic respiratory failure with hypoxia and hypercapnia (HCC) 06/27/2017   Hyperkalemia 06/27/2017   Acute renal failure superimposed on stage 2 chronic kidney disease (HCC) 06/27/2017   Restrictive lung disease 12/13/2015   Obesity hypoventilation syndrome (HCC) 05/10/2015   Acute and chronic respiratory failure with hypoxia (HCC) 05/10/2015   Acute on chronic diastolic CHF (congestive heart failure) (HCC) 03/03/2015   OSA (obstructive sleep apnea) 03/02/2015   Morbid obesity with BMI of 50.0-59.9, adult (HCC) 10/18/2013   Lymphedema 01/26/2013   Chronic diastolic CHF (congestive heart failure) (HCC) 01/04/2013   Chronic cough    COPD (chronic obstructive pulmonary disease)Gold C    Snoring    Low back pain    Hypertension    Past Medical History:  Past Medical History:  Diagnosis Date   Allergies    Anemia    Back pain    Bilateral swelling of feet    CHF (congestive heart failure) (HCC)    Chronic cough    COPD (chronic obstructive pulmonary disease) (HCC)    Depression    GERD (gastroesophageal reflux disease)    Heart murmur    Hypertension     Hypocapnia    Hypothyroid    Knee pain    Leg edema    chronic, bilateral   Lightheaded    Low back pain    Morbid obesity (HCC)    Neck pain    Obesity hypoventilation syndrome (HCC)    Oral lesion    Shortness of breath    Shortness of breath    Shoulder pain    Snoring    Past Surgical History:  Past Surgical History:  Procedure Laterality Date   APPENDECTOMY     CHOLECYSTECTOMY     COLONOSCOPY N/A 06/25/2013   Procedure: COLONOSCOPY;  Surgeon: Belvie JONETTA Just, MD;  Location: [facility];  Service: Endoscopy;  Laterality: N/A;   ECTOPIC PREGNANCY SURGERY     Left Ovary Removed     MENISCUS REPAIR     TONSILLECTOMY     TUBAL LIGATION     HPI:  73 y.o female admitted 10/16/23 with AMS and Rt pulmonary edema. CT No CT evidence for acute intracranial abnormality, interval mild atrophy and chronic small vessel ischemic changes of the white matter. Interval mild ventricular enlargement, likely due to atrophy progression. PMH: admission 7/11-7/18 with D/C to SNF, return home 8/3. CHF, OSA, COPD, hypothyroidism, obesity, chronic respiratory failure, depression,chronic back pain, HTN   Assessment / Plan / Recommendation Clinical Impression  Pt currently with significantly altered mental status with pt only able to maintain awake state for approximately 5-8 seconds before falling asleep caused by acute medical issues. Husband states prior to admission she was independent and cognitively intact. During brief  period of awake state she correctly stated her age, place and identified her husband and stated name. Presently she is unable to participate in full eval/therapy and suspect as medical condition improves her  encephalopathy and cognition will improve. Discussed with husband and ST is not recommended at this time. If medical issues resolve and pt continues to exhibit cognitive impairments she can be reassessed at next level of care.    SLP Assessment  SLP Recommendation/Assessment: All  further Speech Language Pathology needs can be addressed in the next venue of care (or may not need as medical issues causing encephalopathy improves) SLP Visit Diagnosis: Cognitive communication deficit (R41.841)     Assistance Recommended at Discharge     Functional Status Assessment    Frequency and Duration           SLP Evaluation Cognition  Overall Cognitive Status: Impaired/Different from baseline Arousal/Alertness: Lethargic Orientation Level: Oriented to person;Oriented to place (and husband) Attention:  (freq falls asleep) Memory:  (UTA) Awareness:  (UTA) Problem Solving:  (UTA) Safety/Judgment: Impaired       Comprehension  Auditory Comprehension Overall Auditory Comprehension:  (comprehended simple orientation questions) Visual Recognition/Discrimination Discrimination: Not tested Reading Comprehension Reading Status: Not tested    Expression Expression Primary Mode of Expression: Verbal Verbal Expression Overall Verbal Expression: Appears within functional limits for tasks assessed (for limited responses) Level of Generative/Spontaneous Verbalization: Word;Phrase Repetition:  (NT) Naming: Not tested Pragmatics: Unable to assess Written Expression Written Expression: Not tested   Oral / Motor  Oral Motor/Sensory Function Overall Oral Motor/Sensory Function:  (unable to assess) Motor Speech Overall Motor Speech: Appears within functional limits for tasks assessed (with limited verbal output)            Dustin Olam Bull 10/17/2023, 10:58 AM

## 2023-10-17 NOTE — Hospital Course (Addendum)
 73 y.o. female with medical history significant of morbid obesity, chronic respiratory failure with hypoxia and hypercarbia, COPD, GERD, obstructive sleep apnea, hypothyroidism, obesity hypoventilation syndrome, depression, diastolic CHF, chronic back pain bilateral lymphedema essential hypertension among other things who was recently admitted to the hospital with acute on chronic respiratory failure and subsequently discharged to skilled facility.    Assessment and Plan:   Acute metabolic encephalopathy - Likely in the setting of hypercapnic respiratory failure.  Showing improvement.  Much more alert and conversive.  Appears to be resolved.   Acute hypoxic and hypercapnic respiratory failure - Initially did not tolerate BiPAP reportedly.  pCO2 elevated on presentation.  Placed on high flow.  Continues to show improvement.  Able to be weaned down to 3-4 L which is patient's baseline.  O2 sat goal 88-92%.  Given multiple exacerbations, patient would benefit from home CPAP.  Discussion with son that patient does not own a CPAP machine.  Will work with TOC on hopefully being able to acquire one prior to discharge.  Will send referral for pulmonology upon discharge for sleep study.  Patient will be provided CPAP at SNF on discharge.   Acute exacerbation of HFpEF - Likely contributing to patient's hypoxia and hypercapnia.  Responding well to initial diuresis.  Continue IV Lasix  to 60 mg every 12 hours.  Continue Diamox  as noted below.  Monitor I's and O's.  Recheck BMP and magnesium  in AM.  Supplemental O2 as above.   Metabolic alkalosis - Likely combination of chronic hypercapnia as well as contraction alkalosis.  Continue Diamox  twice daily.  Adding NS at 50 mL/h.  Recheck BMP in AM.   Hypokalemia - Replenishment on board.  Will recheck BMP and mag in AM.   Acute kidney injury (POA) - Creatinine elevated above baseline (0.6).  Initiated on IV fluids with diuresis as above.  Showing improvement this  morning.  Will recheck BMP in AM.   Hypertension - Initially very hypertensive.  However has responded well to diuresis.  Previously initiated Coreg , now discontinued as patient now is borderline low blood pressures.     Lymphedema - Diuresis as above.  Now has lymphedema wraps.   Obstructive sleep apnea/obesity hypoventilation - CPAP on board.  Attempting to encourage use of CPAP.   Physical debilitation muscle weakness - Evaluated by PT/OT.  Recommending SNF.  Working closely with TOC on disposition.   Goals of care - Working to obtain CPAP for patient.  Will likely need to be ordered by SNF prior to her discharge home.  Patient family also requested the patient pursue home health after transition out of SNF.

## 2023-10-17 NOTE — Progress Notes (Signed)
 Progress Note   Patient: Morgan Odonnell FMW:[id number] DOB: [birth year] DOA: 10/16/2023  DOS: the patient was seen and examined on 10/17/2023   Brief hospital course:  73 y.o. female with medical history significant of morbid obesity, chronic respiratory failure with hypoxia and hypercarbia, COPD, GERD, obstructive sleep apnea, hypothyroidism, obesity hypoventilation syndrome, depression, diastolic CHF, chronic back pain bilateral lymphedema essential hypertension among other things who was recently admitted to the hospital with acute on chronic respiratory failure and subsequently discharged to skilled facility.   Assessment and Plan:  Acute metabolic encephalopathy - Likely in the setting of hypercapnic respiratory failure.  Appears to be showing mild improvement this morning.  Continue monitor closely.  Acute hypoxic and hypercapnic respiratory failure - Did not tolerate BiPAP.  pCO2 elevated on presentation.  Placed on high flow.  Mild improvement mentation this morning.  Currently on 8 L [place].  Will work to wean O2 as tolerated.  O2 sat goal 88-92%.  Acute exacerbation of HFpEF - Likely contributing to patient's hypoxia and hypercapnia.  Responding well to initial diuresis.  Will increase IV Lasix  to 60 mg every 8 hours.  Monitor I's and O's.  Recheck BMP and magnesium  in AM.  Supplemental O2 as above.  Hypokalemia - Replenishment on board.  Showing improvement this morning.  Will recheck BMP and mag in AM.  Hypertension - Not well-controlled this morning.  Diuresis on board.  Will initiate Coreg  6.25 milligram twice daily.  Monitor response.  Lymphedema - Diuresis as above.    Obstructive sleep apnea/obesity hypoventilation - Patient is not tolerated BiPAP.  Encourage CPAP night.   Subjective: Patient sitting up in bed, still a bit lethargic but appears improved per reports from family at bedside.  Denies any fevers, worsening shortness of breath, chest pain, nausea, vomiting,  abdominal pain.  Urine output excellent.  O2 sats in the upper 90s.  Physical Exam:  Vitals:   10/17/23 0454 10/17/23 0456 10/17/23 0750 10/17/23 0944  BP: (!) 144/113     Pulse: (!) 57     Resp: 18     Temp: 98.3 F (36.8 C) 98.3 F (36.8 C) 98.3 F (36.8 C)   TempSrc: Oral Oral Axillary   SpO2: 100%   93%  Weight:  118.3 kg    Height:        GENERAL:  Alert, lethargic, disheveled, obese HEENT:  EOMI CARDIOVASCULAR:  RRR, no murmurs appreciated RESPIRATORY: Poor air movement bilaterally, mild expiratory wheezing appreciated GASTROINTESTINAL:  Soft, nontender, nondistended EXTREMITIES: BL LE pitting edema with chronic lymphedema NEURO:  No new focal deficits appreciated SKIN:  No rashes noted PSYCH: Lethargic but pleasant    Data Reviewed:  Imaging Studies: CT Head Wo Contrast Result Date: 10/16/2023 CLINICAL DATA:  Mental status change EXAM: CT HEAD WITHOUT CONTRAST TECHNIQUE: Contiguous axial images were obtained from the base of the skull through the vertex without intravenous contrast. RADIATION DOSE REDUCTION: This exam was performed according to the departmental dose-optimization program which includes automated exposure control, adjustment of the mA and/or kV according to patient size and/or use of iterative reconstruction technique. COMPARISON:  06/26/2017 FINDINGS: Brain: No acute territorial infarction, hemorrhage or intracranial mass. Interval mild atrophy. Mild chronic small vessel ischemic changes of the white matter. Interval ventricular enlargement, likely due to atrophy progression Vascular: No hyperdense vessels.  No unexpected calcification Skull: Normal. Negative for fracture or focal lesion. Sinuses/Orbits: No acute finding. Other: None IMPRESSION: 1. No CT evidence for acute intracranial abnormality. 2. Interval mild  atrophy and chronic small vessel ischemic changes of the white matter. Interval mild ventricular enlargement, likely due to atrophy progression.  Electronically Signed   By: Luke Bun M.D.   On: 10/16/2023 21:41   DG Chest Portable 1 View Result Date: 10/16/2023 CLINICAL DATA:  Shortness of breath EXAM: PORTABLE CHEST 1 VIEW COMPARISON:  Radiograph and CT 09/19/2023 FINDINGS: Similar cardiomegaly. Aortic atherosclerotic calcification. Pulmonary vascular congestion. Interstitial coarsening and hazy airspace opacities in the lower lungs. Question small left pleural effusion. No pneumothorax. IMPRESSION: 1. Findings most compatible with CHF right and pulmonary edema. Electronically Signed   By: Norman Gatlin M.D.   On: 10/16/2023 18:53   CT Angio Chest PE W and/or Wo Contrast Result Date: 09/19/2023 CLINICAL DATA:  The EXAM: CT ANGIOGRAPHY CHEST WITH CONTRAST TECHNIQUE: Multidetector CT imaging of the chest was performed using the standard protocol during bolus administration of intravenous contrast. Multiplanar CT image reconstructions and MIPs were obtained to evaluate the vascular anatomy. RADIATION DOSE REDUCTION: This exam was performed according to the departmental dose-optimization program which includes automated exposure control, adjustment of the mA and/or kV according to patient size and/or use of iterative reconstruction technique. CONTRAST:  OMNIPAQUE  IOHEXOL  350 MG/ML SOLN COMPARISON:  Portable chest 08/25/2023, portable chest 08/15/2023, AP Lat chest 09/28/2019, older chest CT with IV contrast 10/29/2007. FINDINGS: Cardiovascular: There is diagnostic arterial opacification. No evidence of arterial embolus. The pulmonary arteries and trunk are prominent consistent with arterial hypertension. The pulmonary trunk is 3.5 cm. There is moderate cardiomegaly with prominence of the right chambers consistent with right heart dysfunction possibly chronic, with IVC reflux. There is no pericardial effusion. There are scattered three-vessel coronary calcifications and a heavily calcified mitral ring. The central pulmonary veins are slightly  prominent. There are scattered calcific plaques in the aorta and great vessels, mild tortuosity, without aneurysm, stenosis or dissection. There are scattered calcifications and slight thickening of the aortic valve leaflets. Mediastinum/Nodes: There is new multifocal mediastinal adenopathy. For example there is a left prevascular space node 1.4 cm in short axis on 4:29, right paratracheal adenopathy, largest of these measuring 1.7 cm on 4:34, and AP window lymph node is 1.3 cm on 4:34, and a subcarinal lymph node is 1.4 cm on 4:57. In the hila, there are enlarged lymph nodes on the right the largest of which is 1.6 cm on 4:50, single slightly prominent left hilar lymph node 1.1 cm on 4:33. There is no bulky or encasing adenopathy or mass effect associated. This could be reactive adenopathy. Axillary spaces are clear. Thyroid  gland is not seen and could be above the plane of imaging or atrophic in the interval. Thoracic trachea and main bronchi are patent. Thoracic esophagus unremarkable. Small hiatal hernia. Mildly prominent right paraesophageal lymph nodes in the lower mediastinum are also seen. Lungs/Pleura: There are symmetric small layering pleural effusions. There is mild interlobular septal thickening in the lung bases consistent with mild edema. Patchy consolidation noted in the dorsal inferior lingula and in left lower lobe consistent with pneumonia or aspiration intermixed with coarsely reticular atelectatic change. There is additional increased opacity in the anterior segment of the right upper lobe likely additional focal infiltrate. There is diffuse bronchial thickening which could be congestive or bronchitic. Respiratory motion is noted with no other focal lung opacities allowing for motion. No pneumothorax. Upper Abdomen: Moderate hepatic steatosis.  Remote cholecystectomy. There appears to be a mildly cirrhotic liver configuration, capsular nodularity in the left lobe. No splenomegaly or ascites. No  acute findings. Musculoskeletal: Degenerative changes and mild kyphodextroscoliosis thoracic spine. No acute or other significant osseous findings or focal pathologic process. Unremarkable visualized chest wall. Review of the MIP images confirms the above findings. IMPRESSION: 1. No evidence of arterial embolus. 2. Prominent pulmonary arteries and trunk consistent with arterial hypertension. 3. Moderate cardiomegaly with prominence of the right chambers consistent with right heart dysfunction possibly chronic, with IVC reflux. 4. Aortic and coronary artery atherosclerosis. 5. Small pleural effusions with mild interlobular septal thickening in the lung bases consistent with mild edema. 6. Patchy consolidation in the dorsal lingula and left lower lobe consistent with pneumonia or aspiration intermixed with coarsely reticular atelectatic change. 7. Additional focal opacity in the anterior segment of the right upper lobe likely additional focal infiltrate. 8. Diffuse bronchial thickening which could be congestive or bronchitic. 9. New multifocal mediastinal and right hilar adenopathy. This could be reactive. Follow-up CT recommended after treatment. 10. Hepatic steatosis with mildly cirrhotic liver configuration. No splenomegaly or ascites. 11. Small hiatal hernia. Aortic Atherosclerosis (ICD10-I70.0). Electronically Signed   By: Francis Quam M.D.   On: 09/19/2023 03:35   DG Chest Portable 1 View Result Date: 09/19/2023 CLINICAL DATA:  Hemoptysis, shortness of breath EXAM: PORTABLE CHEST 1 VIEW COMPARISON:  08/25/2023 FINDINGS: Cardiomegaly, vascular congestion. Diffuse interstitial prominence and bilateral lower lobe airspace opacities, likely edema. No effusions. No acute bony abnormality. IMPRESSION: Cardiomegaly with vascular congestion and mild to moderate pulmonary edema. Electronically Signed   By: Franky Crease M.D.   On: 09/19/2023 02:58    There are no new results to review at this time.  Previous  records (including but not limited to H&P, progress notes, nursing notes, TOC management) were reviewed in assessment of this patient.  Labs: CBC: Recent Labs  Lab 10/16/23 1728 10/16/23 1751 10/17/23 0317  WBC 8.5  --  7.3  HGB 10.6* 11.6*  11.9* 10.5*  HCT 34.4* 34.0*  35.0* 34.7*  MCV 91.2  --  90.6  PLT 183  --  194   Basic Metabolic Panel: Recent Labs  Lab 10/16/23 1728 10/16/23 1751 10/16/23 2352 10/17/23 0317  NA 139 141  142  --  139  K 2.9* 2.9*  2.9*  --  3.5  CL 98 96*  --  99  CO2 31  --   --  32  GLUCOSE 88 89  --  99  BUN 17 18  --  16  CREATININE 1.10* 1.20* 1.01* 1.12*  CALCIUM  8.7*  --   --  9.0   Liver Function Tests: Recent Labs  Lab 10/17/23 0317  AST 27  ALT 15  ALKPHOS 75  BILITOT 1.3*  PROT 8.3*  ALBUMIN 2.3*   CBG: No results for input(s): GLUCAP in the last 168 hours.  Scheduled Meds:  carvedilol   6.25 mg Oral BID WC   citalopram   20 mg Oral Daily   enoxaparin  (LOVENOX ) injection  40 mg Subcutaneous QHS   feeding supplement  237 mL Oral BID BM   furosemide   60 mg Intravenous Q8H   levothyroxine   200 mcg Oral QAC breakfast   potassium chloride   20 mEq Oral BID   umeclidinium bromide   1 puff Inhalation Daily   Continuous Infusions: PRN Meds:.hydrALAZINE , ipratropium-albuterol , ondansetron  **OR** ondansetron  (ZOFRAN ) IV  Family Communication: Family at bedside  Disposition: Status is: Inpatient Remains inpatient appropriate because: Respiratory failure     Time spent: 52 minutes  Length of inpatient stay: 1 days  Author: Carliss LELON Canales, DO  10/17/2023 10:17 AM  For on call review [email or web address].

## 2023-10-17 NOTE — TOC Initial Note (Signed)
 Transition of Care [facility]) - Initial/Assessment Note    Patient Details  Name: Morgan Odonnell MRN: [id number] Date of Birth: 12-10-1950  Transition of [facility]) CM/SW Contact:    Luise JAYSON Pan, LCSWA Phone Number: 10/17/2023, 3:07 PM  Clinical Narrative:    CSW met patient at bedside to discuss PT recs for SNF. Patient husband, [name], was at bedside as well. [name] asked CSW to call their son, [name], who is traveling back to the hospital. CSW called [name] to discuss PT recs for SNF with family in room. Family is agreeable to SNF workup being done at this time. Lawrence stated patient was previously at [facility] but may want to try a different facility.   Lawrence inquired about a home bipap for patient. CSW informed him that typically the facility will have to order a bipap for patient to go home with.  CSW will continue to follow.       Expected Discharge Plan: Skilled Nursing Facility Barriers to Discharge: Continued Medical Work up, SNF Pending bed offer, Insurance Authorization   Patient Goals and CMS Choice Patient states their goals for this hospitalization and ongoing recovery are:: To go to rehab          Expected Discharge Plan and Services In-house Referral: Clinical Social Work     Living arrangements for the past 2 months: Single Family Home                                      Prior Living Arrangements/Services Living arrangements for the past 2 months: Single Family Home Lives with:: Spouse Patient language and need for interpreter reviewed:: No Do you feel safe going back to the place where you live?: Yes      Need for Family Participation in Patient Care: Yes (Comment) Care giver support system in place?: Yes (comment)   Criminal Activity/Legal Involvement Pertinent to Current Situation/Hospitalization: No - Comment as needed  Activities of Daily Living   ADL Screening (condition at time of admission) Independently performs ADLs?:  No Does the patient have a NEW difficulty with bathing/dressing/toileting/self-feeding that is expected to last >3 days?: Yes (Initiates electronic notice to provider for possible OT consult) Does the patient have a NEW difficulty with getting in/out of bed, walking, or climbing stairs that is expected to last >3 days?: Yes (Initiates electronic notice to provider for possible PT consult) Does the patient have a NEW difficulty with communication that is expected to last >3 days?: Yes (Initiates electronic notice to provider for possible SLP consult) Is the patient deaf or have difficulty hearing?: Yes Does the patient have difficulty seeing, even when wearing glasses/contacts?: No Does the patient have difficulty concentrating, remembering, or making decisions?: Yes  Permission Sought/Granted Permission sought to share information with : Facility Medical sales representative, Family Supports Permission granted to share information with : Yes, Verbal Permission Granted  Share Information with NAME: [name] [name]  Permission granted to share info w AGENCY: SNFs  Permission granted to share info w Relationship: Spouse, son  Permission granted to share info w Contact Information: [phone], [phone]  Emotional Assessment Appearance:: Appears stated age Attitude/Demeanor/Rapport: Lethargic Affect (typically observed): Calm Orientation: : Oriented to Self, Oriented to Place Alcohol / Substance Use: Not Applicable Psych Involvement: No (comment)  Admission diagnosis:  Hypokalemia [E87.6] Altered mental status, unspecified altered mental status type [R41.82] Acute metabolic encephalopathy [G93.41] Patient Active Problem List  Diagnosis Date Noted   Acute metabolic encephalopathy 10/16/2023   Hypokalemia 10/16/2023   Acute on chronic heart failure with preserved ejection fraction (HFpEF) (HCC) 09/19/2023   Hyponatremia 08/29/2023   Dysphagia 08/29/2023   Palliative care encounter  08/29/2023   Counseling and coordination of care 08/29/2023   Goals of care, counseling/discussion 08/29/2023   Heart failure (HCC) 08/29/2023   Need for emotional support 08/29/2023   Multifocal pneumonia 08/26/2023   Normocytic anemia 08/16/2023   Precordial pain 02/21/2021   Exertional dyspnea 02/21/2021   Acute on chronic respiratory failure with hypoxia and hypercapnia (HCC) 06/27/2017   Hyperkalemia 06/27/2017   Acute renal failure superimposed on stage 2 chronic kidney disease (HCC) 06/27/2017   Restrictive lung disease 12/13/2015   Obesity hypoventilation syndrome (HCC) 05/10/2015   Acute and chronic respiratory failure with hypoxia (HCC) 05/10/2015   Acute on chronic diastolic CHF (congestive heart failure) (HCC) 03/03/2015   OSA (obstructive sleep apnea) 03/02/2015   Morbid obesity with BMI of 50.0-59.9, adult (HCC) 10/18/2013   Lymphedema 01/26/2013   Chronic diastolic CHF (congestive heart failure) (HCC) 01/04/2013   Chronic cough    COPD (chronic obstructive pulmonary disease)Gold C    Snoring    Low back pain    Hypertension    PCP:  Pcp, No Pharmacy:  No Pharmacies Listed    Social Drivers of Health (SDOH) Social History: SDOH Screenings   Food Insecurity: No Food Insecurity (10/16/2023)  Housing: Low Risk  (10/16/2023)  Transportation Needs: No Transportation Needs (10/16/2023)  Utilities: Not At Risk (10/16/2023)  Depression (PHQ2-9): Medium Risk (01/13/2019)  Social Connections: Moderately Integrated (10/16/2023)  Tobacco Use: Medium Risk (10/16/2023)   SDOH Interventions:     Readmission Risk Interventions    09/19/2023    1:52 PM 08/17/2023    9:31 AM  Readmission Risk Prevention Plan  Transportation Screening Complete Complete  PCP or Specialist Appt within 5-7 Days  Complete  PCP or Specialist Appt within 3-5 Days Complete   Home Care Screening  Complete  Medication Review (RN CM)  Complete  HRI or Home Care Consult Complete   Social Work Consult for  Recovery Care Planning/Counseling Complete   Palliative Care Screening Complete   Medication Review Oceanographer) Complete

## 2023-10-17 NOTE — Plan of Care (Signed)

## 2023-10-17 NOTE — Evaluation (Addendum)
 Occupational Therapy Evaluation Patient Details Name: Morgan Odonnell MRN: [id number] DOB: 03-04-1951 Today's Date: 10/17/2023   History of Present Illness   73 y.o female admitted 10/16/23 with AMS and Rt pulmonary edema. PMH: admission 7/11-7/18 with D/C to SNF, return home 8/3. CHF, OSA, COPD, hypothyroidism, obesity, chronic respiratory failure, depression,chronic back pain, HTN     Clinical Impressions Pt admitted based on above, and was seen based on problem list below. Per spouse, pt has been non-ambulatory for a year. He reports primarily mod to total assist for ADLs from recliner including toileting. Per spouse, pt was able to self feed herself, prior to July hospital admission. Today pt is requiring mod to total +2 for bed level ADLs. Pt with BUE weakness, unable to maintain shoulder flexion against gravity, decreased sensation and grip strength limit pt's ability to complete UB ADLs. Bed mobility was  max +2  for rolling. Pt lethargic throughout session, but during bed mobility pt awoke, and was able to briefly attempt to answer orientation questions, before falling back asleep. Will continue to assess cognition at future sessions as arousal level improves. Pt would benefit from <3 hours of skilled rehab daily prior to d/c home to reduce caregiver burden. OT will continue to follow acutely to maximize functional independence.        If plan is discharge home, recommend the following:   A little help with bathing/dressing/bathroom;Two people to help with bathing/dressing/bathroom     Functional Status Assessment   Patient has had a recent decline in their functional status and demonstrates the ability to make significant improvements in function in a reasonable and predictable amount of time.     Equipment Recommendations   Wheelchair (measurements OT);Wheelchair cushion (measurements OT);Hoyer lift;Hospital bed;BSC/3in1 (Bariatric)      Precautions/Restrictions    Precautions Precautions: Fall;Other (comment) Recall of Precautions/Restrictions: Impaired Precaution/Restrictions Comments: Fragile skin, edema, weeping LB, O2 dep Restrictions Weight Bearing Restrictions Per Provider Order: No     Mobility Bed Mobility Overal bed mobility: Needs Assistance Bed Mobility: Rolling Rolling: Max assist, +2 for physical assistance, Used rails         General bed mobility comments: Inital roll was total assist d/t arousal level, as arousal increased pt able to assist by reaching and holding onto rail          ADL either performed or assessed with clinical judgement   ADL Overall ADL's : Needs assistance/impaired Eating/Feeding: Moderate assistance;Bed level Eating/Feeding Details (indicate cue type and reason): Assist for grasp and hand to mouth d/t generalized weakness & sensation Grooming: Wash/dry face;Moderate assistance Grooming Details (indicate cue type and reason): Pt with difficulty grasping washcloth, and sustaining hand to face, frequently dropping arm d/t weakness Upper Body Bathing: Maximal assistance;Bed level   Lower Body Bathing: Total assistance;Bed level   Upper Body Dressing : Maximal assistance;Bed level   Lower Body Dressing: Total assistance;Bed level       Toileting- Clothing Manipulation and Hygiene: Total assistance;Bed level;+2 for physical assistance Toileting - Clothing Manipulation Details (indicate cue type and reason): +2 for bed level rolling       General ADL Comments: Muscle weakness limiting pt's abillity to assist with UB ADLs     Vision Patient Visual Report: No change from baseline Vision Assessment?: No apparent visual deficits            Pertinent Vitals/Pain Pain Assessment Pain Assessment: Faces Faces Pain Scale: Hurts little more Pain Location: legs with movement Pain Descriptors / Indicators: Discomfort,  Aching Pain Intervention(s): Limited activity within patient's tolerance      Extremity/Trunk Assessment Upper Extremity Assessment Upper Extremity Assessment: Generalized weakness;RUE deficits/detail;LUE deficits/detail RUE Deficits / Details: 2-/5 pt unable to bring BUE to full ROM against gravity, full elbow, wrist, and hand ROM, decreased grip strength and sensation RUE Sensation: decreased light touch RUE Coordination: decreased fine motor;decreased gross motor LUE Deficits / Details: 2-/5 pt unable to bring BUE to full ROM against gravity, full elbow, wrist, and hand ROM, decreased grip strength and sensation LUE Sensation: decreased light touch LUE Coordination: decreased fine motor;decreased gross motor   Lower Extremity Assessment Lower Extremity Assessment: Defer to PT evaluation RLE Deficits / Details: 2-/5 strength with ROM limited by body habitus. hip with slight lateral external rotation, unable to bend knee successfully in supine LLE Deficits / Details: hip external rotation maintained throughout with limited ability to move hip and leg with assist, body habitus impairing ROM. able to shift leg with max assist grossly 1-2-/5 strength   Cervical / Trunk Assessment Cervical / Trunk Assessment: Other exceptions Cervical / Trunk Exceptions: increased body habitus   Communication Communication Communication: Impaired Factors Affecting Communication: Reduced clarity of speech   Cognition Arousal: Lethargic Behavior During Therapy: Flat affect Cognition: Difficult to assess Difficult to assess due to: Level of arousal   OT - Cognition Comments: Pt having brief periods of arousal, when asked whether it was winter or summer pt stated winter, not able to verbalize if she was home or at the hospital     Following commands: Impaired Following commands impaired: Follows one step commands inconsistently     Cueing  General Comments   Cueing Techniques: Verbal cues;Gestural cues;Tactile cues;Visual cues  VSS on 8L HFNC           Home Living  Family/patient expects to be discharged to:: Private residence Living Arrangements: Spouse/significant other Available Help at Discharge: Family;Available 24 hours/day Type of Home: House Home Access: Ramped entrance     Home Layout: One level     Bathroom Shower/Tub: Walk-in Pensions consultant: Standard Bathroom Accessibility: Yes How Accessible: Accessible via walker Home Equipment: Rolling Walker (2 wheels);Cane - single point;Wheelchair - manual;Shower seat;Grab bars - tub/shower;Adaptive equipment;Hand held shower head;Hospital bed;Toilet riser Adaptive Equipment: Reacher Additional Comments: O2 at baseline  Lives With: Spouse    Prior Functioning/Environment Prior Level of Function : Needs assist       Physical Assist : Mobility (physical);ADLs (physical) Mobility (physical): Transfers ADLs (physical): Bathing;Dressing;Toileting;IADLs Mobility Comments: Unable to stand/walk. Been using recliner to sleep. WC after discharge from facility and needed help transferring her to recliner with +2. has not transferred out of recliner since return home ADLs Comments: Toileting using bedpan by rolling in recliner with max assist of spouse, sponge bathe in chair    OT Problem List: Decreased strength;Decreased range of motion;Decreased activity tolerance;Impaired balance (sitting and/or standing);Decreased safety awareness;Decreased cognition;Decreased knowledge of use of DME or AE;Cardiopulmonary status limiting activity   OT Treatment/Interventions: Self-care/ADL training;Therapeutic exercise;Energy conservation;DME and/or AE instruction;Therapeutic activities;Patient/family education;Balance training      OT Goals(Current goals can be found in the care plan section)   Acute Rehab OT Goals Patient Stated Goal: For pt to go to snf OT Goal Formulation: With patient Time For Goal Achievement: 10/31/23 Potential to Achieve Goals: Fair   OT Frequency:  Min  1X/week       AM-PAC OT 6 Clicks Daily Activity     Outcome Measure Help from another person  eating meals?: A Lot Help from another person taking care of personal grooming?: A Lot Help from another person toileting, which includes using toliet, bedpan, or urinal?: Total Help from another person bathing (including washing, rinsing, drying)?: A Lot Help from another person to put on and taking off regular upper body clothing?: A Lot Help from another person to put on and taking off regular lower body clothing?: Total 6 Click Score: 10   End of Session Equipment Utilized During Treatment: Oxygen  Nurse Communication: Mobility status  Activity Tolerance: Patient limited by fatigue Patient left: in bed;with call bell/phone within reach;with bed alarm set;with family/visitor present  OT Visit Diagnosis: Unsteadiness on feet (R26.81);Other abnormalities of gait and mobility (R26.89);Muscle weakness (generalized) (M62.81)                Time: 1103-1130 OT Time Calculation (min): 27 min Charges:  OT General Charges $OT Visit: 1 Visit OT Evaluation $OT Eval Moderate Complexity: 1 Mod OT Treatments $Self Care/Home Management : 8-22 mins  Adrianne BROCKS, OT  Acute Rehabilitation Services Office [phone] Secure chat preferred   Adrianne GORMAN Savers 10/17/2023, 12:35 PM

## 2023-10-17 NOTE — NC FL2 (Signed)
 [place]  MEDICAID FL2 LEVEL OF CARE FORM     IDENTIFICATION  Patient Name: Morgan Odonnell Birthdate: Jul 31, 1950 Sex: female Admission Date (Current Location): 10/16/2023  [facility] and [place] Number:  Producer, television/film/video and Address:  [facility], [address], [place], [place] [address]      Provider Number: [id number]  Attending Physician Name and Address:  Arlon Carliss ORN, DO  Relative Name and Phone Number:  Asuncion, [place]ott (Spouse)  [phone] [facility])    Current Level of Care: Hospital Recommended Level of Care: Skilled Nursing Facility Prior Approval Number:    Date Approved/Denied:   PASRR Number: [id number]  Discharge Plan: SNF    Current Diagnoses: Patient Active Problem List   Diagnosis Date Noted   Acute metabolic encephalopathy 10/16/2023   Hypokalemia 10/16/2023   Acute on chronic heart failure with preserved ejection fraction (HFpEF) (HCC) 09/19/2023   Hyponatremia 08/29/2023   Dysphagia 08/29/2023   Palliative care encounter 08/29/2023   Counseling and coordination of care 08/29/2023   Goals of care, counseling/discussion 08/29/2023   Heart failure (HCC) 08/29/2023   Need for emotional support 08/29/2023   Multifocal pneumonia 08/26/2023   Normocytic anemia 08/16/2023   Precordial pain 02/21/2021   Exertional dyspnea 02/21/2021   Acute on chronic respiratory failure with hypoxia and hypercapnia (HCC) 06/27/2017   Hyperkalemia 06/27/2017   Acute renal failure superimposed on stage 2 chronic kidney disease (HCC) 06/27/2017   Restrictive lung disease 12/13/2015   Obesity hypoventilation syndrome (HCC) 05/10/2015   Acute and chronic respiratory failure with hypoxia (HCC) 05/10/2015   Acute on chronic diastolic CHF (congestive heart failure) (HCC) 03/03/2015   OSA (obstructive sleep apnea) 03/02/2015   Morbid obesity with BMI of 50.0-59.9, adult (HCC) 10/18/2013   Lymphedema 01/26/2013   Chronic diastolic CHF  (congestive heart failure) (HCC) 01/04/2013   Chronic cough    COPD (chronic obstructive pulmonary disease)Gold C    Snoring    Low back pain    Hypertension     Orientation RESPIRATION BLADDER Height & Weight     Self, Place  O2 (8L (working on weening)) Incontinent, External catheter Weight: 260 lb 12.9 oz (118.3 kg) Height:  5' 2 (157.5 cm)  BEHAVIORAL SYMPTOMS/MOOD NEUROLOGICAL BOWEL NUTRITION STATUS      Incontinent Diet (See discharge summary)  AMBULATORY STATUS COMMUNICATION OF NEEDS Skin   Extensive Assist Verbally Normal PU Stage 1 Dressing:  (NA)                     Personal Care Assistance Level of Assistance  Bathing, Feeding, Dressing Bathing Assistance: Maximum assistance Feeding assistance: Limited assistance Dressing Assistance: Maximum assistance     Functional Limitations Info  Sight, Hearing, Speech Sight Info: Impaired (eyeglasses) Hearing Info: Adequate Speech Info: Adequate    SPECIAL CARE FACTORS FREQUENCY  PT (By licensed PT), OT (By licensed OT), Speech therapy     PT Frequency: 5x week OT Frequency: 5x week     Speech Therapy Frequency: 3x week      Contractures Contractures Info: Not present    Additional Factors Info  Code Status, Allergies Code Status Info: DNR Interven Allergies Info: Gabapentin, Naloxone , Novocain (Procaine)           Current Medications (10/17/2023):  This is the current hospital active medication list Current Facility-Administered Medications  Medication Dose Route Frequency Provider Last Rate Last Admin   carvedilol  (COREG ) tablet 6.25 mg  6.25 mg Oral BID WC  Arlon Carliss ORN, DO       citalopram  (CELEXA ) tablet 20 mg  20 mg Oral Daily Garba, Mohammad L, MD       enoxaparin  (LOVENOX ) injection 40 mg  40 mg Subcutaneous QHS Sim Re L, MD   40 mg at 10/16/23 2344   feeding supplement (ENSURE PLUS HIGH PROTEIN) liquid 237 mL  237 mL Oral BID BM Sim Re CROME, MD       furosemide  (LASIX )  injection 60 mg  60 mg Intravenous Q8H Arlon Carliss W, DO   60 mg at 10/17/23 1450   hydrALAZINE  (APRESOLINE ) injection 10 mg  10 mg Intravenous Q6H PRN Arlon Carliss W, DO       ipratropium-albuterol  (DUONEB) 0.5-2.5 (3) MG/3ML nebulizer solution 3 mL  3 mL Nebulization Q6H PRN Sim Re CROME, MD       levothyroxine  (SYNTHROID ) tablet 200 mcg  200 mcg Oral QAC breakfast Garba, Mohammad L, MD       ondansetron  (ZOFRAN ) tablet 4 mg  4 mg Oral Q6H PRN Sim Re CROME, MD       Or   ondansetron  (ZOFRAN ) injection 4 mg  4 mg Intravenous Q6H PRN Sim Re CROME, MD       potassium chloride  (KLOR-CON ) packet 20 mEq  20 mEq Oral BID Sim Re CROME, MD       umeclidinium bromide  (INCRUSE ELLIPTA ) 62.5 MCG/ACT 1 puff  1 puff Inhalation Daily Garba, Mohammad L, MD         Discharge Medications: Please see discharge summary for a list of discharge medications.  Relevant Imaging Results:  Relevant Lab Results:   Additional Information SS#[id number]; bipap needs  Luise JAYSON Pan, LCSWA

## 2023-10-18 DIAGNOSIS — E876 Hypokalemia: Secondary | ICD-10-CM | POA: Diagnosis not present

## 2023-10-18 DIAGNOSIS — I5033 Acute on chronic diastolic (congestive) heart failure: Secondary | ICD-10-CM | POA: Diagnosis not present

## 2023-10-18 DIAGNOSIS — J9621 Acute and chronic respiratory failure with hypoxia: Secondary | ICD-10-CM | POA: Diagnosis not present

## 2023-10-18 DIAGNOSIS — I10 Essential (primary) hypertension: Secondary | ICD-10-CM

## 2023-10-18 DIAGNOSIS — G9341 Metabolic encephalopathy: Secondary | ICD-10-CM | POA: Diagnosis not present

## 2023-10-18 LAB — CBC
HCT: 31.8 % — ABNORMAL LOW (ref 36.0–46.0)
Hemoglobin: 9.8 g/dL — ABNORMAL LOW (ref 12.0–15.0)
MCH: 27.6 pg (ref 26.0–34.0)
MCHC: 30.8 g/dL (ref 30.0–36.0)
MCV: 89.6 fL (ref 80.0–100.0)
Platelets: 189 K/uL (ref 150–400)
RBC: 3.55 MIL/uL — ABNORMAL LOW (ref 3.87–5.11)
RDW: 16.4 % — ABNORMAL HIGH (ref 11.5–15.5)
WBC: 10 K/uL (ref 4.0–10.5)
nRBC: 0 % (ref 0.0–0.2)

## 2023-10-18 LAB — BASIC METABOLIC PANEL WITH GFR
Anion gap: 8 (ref 5–15)
BUN: 18 mg/dL (ref 8–23)
CO2: 32 mmol/L (ref 22–32)
Calcium: 8.5 mg/dL — ABNORMAL LOW (ref 8.9–10.3)
Chloride: 98 mmol/L (ref 98–111)
Creatinine, Ser: 1.16 mg/dL — ABNORMAL HIGH (ref 0.44–1.00)
GFR, Estimated: 50 mL/min — ABNORMAL LOW (ref >60)
Glucose, Bld: 107 mg/dL — ABNORMAL HIGH (ref 70–99)
Potassium: 3.2 mmol/L — ABNORMAL LOW (ref 3.5–5.1)
Sodium: 138 mmol/L (ref 135–145)

## 2023-10-18 LAB — MAGNESIUM: Magnesium: 1.6 mg/dL — ABNORMAL LOW (ref 1.7–2.4)

## 2023-10-18 MED ORDER — ACETAZOLAMIDE ER 500 MG PO CP12
500.0000 mg | ORAL_CAPSULE | Freq: Two times a day (BID) | ORAL | Status: DC
  Administered 2023-10-18 – 2023-10-22 (×13): 500 mg via ORAL
  Filled 2023-10-18 (×10): qty 1

## 2023-10-18 MED ORDER — MUPIROCIN 2 % EX OINT
1.0000 | TOPICAL_OINTMENT | Freq: Two times a day (BID) | CUTANEOUS | Status: AC
  Administered 2023-10-18 – 2023-10-23 (×15): 1 via NASAL
  Filled 2023-10-18 (×3): qty 22

## 2023-10-18 MED ORDER — CHLORHEXIDINE GLUCONATE CLOTH 2 % EX PADS
6.0000 | MEDICATED_PAD | Freq: Every day | CUTANEOUS | Status: AC
  Administered 2023-10-18 – 2023-10-22 (×6): 6 via TOPICAL

## 2023-10-18 MED ORDER — ACETAMINOPHEN 325 MG PO TABS
650.0000 mg | ORAL_TABLET | Freq: Once | ORAL | Status: AC
  Administered 2023-10-18: 650 mg via ORAL
  Filled 2023-10-18: qty 2

## 2023-10-18 MED ORDER — FUROSEMIDE 10 MG/ML IJ SOLN
60.0000 mg | Freq: Two times a day (BID) | INTRAMUSCULAR | Status: DC
  Administered 2023-10-19 – 2023-10-21 (×6): 60 mg via INTRAVENOUS
  Filled 2023-10-18 (×4): qty 6

## 2023-10-18 NOTE — TOC Progression Note (Signed)
 Transition of Care [facility]) - Initial/Assessment Note    Patient Details  Name: Morgan Odonnell MRN: [id number] Date of Birth: [birth year]  Transition of Care [facility]) CM/SW Contact:    Britt JULIANNA Bennetts, LCSW Phone Number: 10/18/2023, 12:30 PM  Clinical Narrative:                 CSW notified by RN that family is requesting information about discharge plan.  CSW met with patient, spouse, and patient's son at bedside. Son inquired about HCPOA, home health, and home CPAP.  The discharge plan at this time is SNF.  CSW informed family that the SNF will coordinate services and equipment needed for home when patient discharges from the SNF.   CSW messaged on call chaplain to inform of family's request for HCPOA.   TOC will continue to follow.   Expected Discharge Plan: Skilled Nursing Facility Barriers to Discharge: Continued Medical Work up, SNF Pending bed offer, Insurance Authorization   Patient Goals and CMS Choice Patient states their goals for this hospitalization and ongoing recovery are:: To go to rehab          Expected Discharge Plan and Services In-house Referral: Clinical Social Work     Living arrangements for the past 2 months: Single Family Home                                      Prior Living Arrangements/Services Living arrangements for the past 2 months: Single Family Home Lives with:: Spouse Patient language and need for interpreter reviewed:: No Do you feel safe going back to the place where you live?: Yes      Need for Family Participation in Patient Care: Yes (Comment) Care giver support system in place?: Yes (comment)   Criminal Activity/Legal Involvement Pertinent to Current Situation/Hospitalization: No - Comment as needed  Activities of Daily Living   ADL Screening (condition at time of admission) Independently performs ADLs?: No Does the patient have a NEW difficulty with bathing/dressing/toileting/self-feeding that is expected to last >3  days?: Yes (Initiates electronic notice to provider for possible OT consult) Does the patient have a NEW difficulty with getting in/out of bed, walking, or climbing stairs that is expected to last >3 days?: Yes (Initiates electronic notice to provider for possible PT consult) Does the patient have a NEW difficulty with communication that is expected to last >3 days?: Yes (Initiates electronic notice to provider for possible SLP consult) Is the patient deaf or have difficulty hearing?: Yes Does the patient have difficulty seeing, even when wearing glasses/contacts?: No Does the patient have difficulty concentrating, remembering, or making decisions?: Yes  Permission Sought/Granted Permission sought to share information with : Facility Medical sales representative, Family Supports Permission granted to share information with : Yes, Verbal Permission Granted  Share Information with NAME: [name] Satterfield  Permission granted to share info w AGENCY: SNFs  Permission granted to share info w Relationship: Spouse, son  Permission granted to share info w Contact Information: [phone], [phone]  Emotional Assessment Appearance:: Appears stated age Attitude/Demeanor/Rapport: Lethargic Affect (typically observed): Calm Orientation: : Oriented to Self, Oriented to Place Alcohol / Substance Use: Not Applicable Psych Involvement: No (comment)  Admission diagnosis:  Hypokalemia [E87.6] Altered mental status, unspecified altered mental status type [R41.82] Acute metabolic encephalopathy [G93.41] Patient Active Problem List   Diagnosis Date Noted   Acute metabolic encephalopathy 10/16/2023   Hypokalemia 10/16/2023   Acute  on chronic heart failure with preserved ejection fraction (HFpEF) (HCC) 09/19/2023   Hyponatremia 08/29/2023   Dysphagia 08/29/2023   Palliative care encounter 08/29/2023   Counseling and coordination of care 08/29/2023   Goals of care, counseling/discussion 08/29/2023   Heart  failure (HCC) 08/29/2023   Need for emotional support 08/29/2023   Multifocal pneumonia 08/26/2023   Normocytic anemia 08/16/2023   Precordial pain 02/21/2021   Exertional dyspnea 02/21/2021   Acute on chronic respiratory failure with hypoxia and hypercapnia (HCC) 06/27/2017   Hyperkalemia 06/27/2017   Acute renal failure superimposed on stage 2 chronic kidney disease (HCC) 06/27/2017   Restrictive lung disease 12/13/2015   Obesity hypoventilation syndrome (HCC) 05/10/2015   Acute and chronic respiratory failure with hypoxia (HCC) 05/10/2015   Acute on chronic diastolic CHF (congestive heart failure) (HCC) 03/03/2015   OSA (obstructive sleep apnea) 03/02/2015   Morbid obesity with BMI of 50.0-59.9, adult (HCC) 10/18/2013   Lymphedema 01/26/2013   Chronic diastolic CHF (congestive heart failure) (HCC) 01/04/2013   Chronic cough    COPD (chronic obstructive pulmonary disease)Gold C    Snoring    Low back pain    Hypertension    PCP:  Pcp, No Pharmacy:  No Pharmacies Listed    Social Drivers of Health (SDOH) Social History: SDOH Screenings   Food Insecurity: No Food Insecurity (10/16/2023)  Housing: Low Risk  (10/16/2023)  Transportation Needs: No Transportation Needs (10/16/2023)  Utilities: Not At Risk (10/16/2023)  Depression (PHQ2-9): Medium Risk (01/13/2019)  Social Connections: Moderately Integrated (10/16/2023)  Tobacco Use: Medium Risk (10/16/2023)   SDOH Interventions:     Readmission Risk Interventions    09/19/2023    1:52 PM 08/17/2023    9:31 AM  Readmission Risk Prevention Plan  Transportation Screening Complete Complete  PCP or Specialist Appt within 5-7 Days  Complete  PCP or Specialist Appt within 3-5 Days Complete   Home Care Screening  Complete  Medication Review (RN CM)  Complete  HRI or Home Care Consult Complete   Social Work Consult for Recovery Care Planning/Counseling Complete   Palliative Care Screening Complete   Medication Review Ocean[employer])  Complete

## 2023-10-18 NOTE — Progress Notes (Signed)
 RT note. Patient sat 98% on 4 L HFNC, no labored breathing noted at this time. Bipap not needed at this time, will place on bipap if needed later. RT will continue to monitor.    10/18/23 0751  Therapy Vitals  Pulse Rate 63  Resp 19  Respiratory Assessment  Assessment Type Post-treatment  Respiratory Pattern Regular;Unlabored  Chest Assessment Chest expansion symmetrical  Bilateral Breath Sounds Diminished  Oxygen  Therapy/Pulse Ox  O2 Device HFNC  O2 Therapy Oxygen  humidified  O2 Flow Rate (L/min) 4 L/min  SpO2 97 %

## 2023-10-18 NOTE — Progress Notes (Signed)
 Chaplain receives page from social work indicating pt's family has requested discussion of health care power of attorney. Chaplain gets the paperwork and engages pt, her husband, and son in conversation about pt's wishes. She is hard of hearing but seems clear about her desires. Chaplain explains paperwork and asks pt to share her desires. She speaks of not wanting a feeding tube or ventilation and wants her son to be her HCPOA. Chaplain encourages them to continue conversation and pursue notarization Monday. They express understanding and pt requests prayer, which chaplain provides.

## 2023-10-18 NOTE — Progress Notes (Signed)
 Progress Note   Patient: Morgan Odonnell FMW:[id number] DOB: 10-26-1950 DOA: 10/16/2023  DOS: the patient was seen and examined on 10/18/2023   Brief hospital course:  73 y.o. female with medical history significant of morbid obesity, chronic respiratory failure with hypoxia and hypercarbia, COPD, GERD, obstructive sleep apnea, hypothyroidism, obesity hypoventilation syndrome, depression, diastolic CHF, chronic back pain bilateral lymphedema essential hypertension among other things who was recently admitted to the hospital with acute on chronic respiratory failure and subsequently discharged to skilled facility.    Assessment and Plan:   Acute metabolic encephalopathy - Likely in the setting of hypercapnic respiratory failure.  Appears to be showing mild improvement this morning.  Much more alert and conversive.  Says she does not remember yesterday.  Continue monitor closely.   Acute hypoxic and hypercapnic respiratory failure - Did not tolerate BiPAP reportedly.  pCO2 elevated on presentation.  Placed on high flow.  Continues to show improvement.  Currently on 6 L [place].  Will work to wean O2 as tolerated.  O2 sat goal 88-92%.  Given multiple exacerbations, patient would benefit from home CPAP or possibly even home BiPAP.  Discussion with son that patient does not own a CPAP machine.  Will work with TOC on hopefully being able to acquire one prior to discharge.   Acute exacerbation of HFpEF - Likely contributing to patient's hypoxia and hypercapnia.  Responding well to initial diuresis.  Will decrease IV Lasix  to 60 mg every 12 hours.  Add Diamox  as noted below.  Monitor I's and O's.  Recheck BMP and magnesium  in AM.  Supplemental O2 as above.  Metabolic alkalosis - Likely combination of chronic hypercapnia as well as contraction alkalosis.  Will initiate Diamox  twice daily.  Recheck BMP in AM.   Hypokalemia - Replenishment on board.  Will recheck BMP and mag in AM.   Hypertension -  Initially very hypertensive.  However has responded well to diuresis.  Previously initiated Coreg , will discontinue for now as patient now is borderline low blood pressures.    Lymphedema - Diuresis as above.  May benefit from lymphedema wraps.   Obstructive sleep apnea/obesity hypoventilation - CPAP on board.  Attempting to encourage use of CPAP.  Physical debilitation muscle weakness - Evaluated by PT/OT.  Recommending SNF.  Working closely with TOC on disposition.  Goals of care - Working to obtain CPAP for patient.  Per reports patient does not have 1 at home.  Will likely need to be ordered by SNF prior to her discharge home.  Patient family also requested the patient pursue home health after transition out of SNF.   Subjective: Patient sitting up in bed, alert, much more talkative.  Improved from yesterday.  States she does not remember meeting me yesterday.  Denies any worsening shortness of breath, fever, nausea, vomiting, abdominal pain.  Doing quite well with diuresis.  Hungry this morning.  Was able to update patient's son over the phone as well.  Working to assure procurement of CPAP machine to limit readmission risk.  Physical Exam:  Vitals:   10/17/23 2356 10/18/23 0518 10/18/23 0530 10/18/23 0751  BP: (!) 91/44 (!) 103/54    Pulse: 70 (!) 59  63  Resp: 20 16  19   Temp: 98.7 F (37.1 C) 98.1 F (36.7 C)    TempSrc: Oral Oral    SpO2: 93% 99%  97%  Weight:   118 kg   Height:        GENERAL:  Alert, pleasant, disheveled,  obese HEENT:  EOMI, nasal cannula CARDIOVASCULAR:  RRR, no murmurs appreciated RESPIRATORY: Poor air movement bilaterally GASTROINTESTINAL:  Soft, nontender, nondistended EXTREMITIES: BL LE pitting edema with chronic lymphedema NEURO:  No new focal deficits appreciated SKIN:  No rashes noted PSYCH: Pleasant, appropriate   Data Reviewed:  Imaging Studies: CT Head Wo Contrast Result Date: 10/16/2023 CLINICAL DATA:  Mental status change EXAM:  CT HEAD WITHOUT CONTRAST TECHNIQUE: Contiguous axial images were obtained from the base of the skull through the vertex without intravenous contrast. RADIATION DOSE REDUCTION: This exam was performed according to the departmental dose-optimization program which includes automated exposure control, adjustment of the mA and/or kV according to patient size and/or use of iterative reconstruction technique. COMPARISON:  06/26/2017 FINDINGS: Brain: No acute territorial infarction, hemorrhage or intracranial mass. Interval mild atrophy. Mild chronic small vessel ischemic changes of the white matter. Interval ventricular enlargement, likely due to atrophy progression Vascular: No hyperdense vessels.  No unexpected calcification Skull: Normal. Negative for fracture or focal lesion. Sinuses/Orbits: No acute finding. Other: None IMPRESSION: 1. No CT evidence for acute intracranial abnormality. 2. Interval mild atrophy and chronic small vessel ischemic changes of the white matter. Interval mild ventricular enlargement, likely due to atrophy progression. Electronically Signed   By: Luke Bun M.D.   On: 10/16/2023 21:41   DG Chest Portable 1 View Result Date: 10/16/2023 CLINICAL DATA:  Shortness of breath EXAM: PORTABLE CHEST 1 VIEW COMPARISON:  Radiograph and CT 09/19/2023 FINDINGS: Similar cardiomegaly. Aortic atherosclerotic calcification. Pulmonary vascular congestion. Interstitial coarsening and hazy airspace opacities in the lower lungs. Question small left pleural effusion. No pneumothorax. IMPRESSION: 1. Findings most compatible with CHF right and pulmonary edema. Electronically Signed   By: Norman Gatlin M.D.   On: 10/16/2023 18:53   CT Angio Chest PE W and/or Wo Contrast Result Date: 09/19/2023 CLINICAL DATA:  The EXAM: CT ANGIOGRAPHY CHEST WITH CONTRAST TECHNIQUE: Multidetector CT imaging of the chest was performed using the standard protocol during bolus administration of intravenous contrast. Multiplanar  CT image reconstructions and MIPs were obtained to evaluate the vascular anatomy. RADIATION DOSE REDUCTION: This exam was performed according to the departmental dose-optimization program which includes automated exposure control, adjustment of the mA and/or kV according to patient size and/or use of iterative reconstruction technique. CONTRAST:  OMNIPAQUE  IOHEXOL  350 MG/ML SOLN COMPARISON:  Portable chest 08/25/2023, portable chest 08/15/2023, AP Lat chest 09/28/2019, older chest CT with IV contrast 10/29/2007. FINDINGS: Cardiovascular: There is diagnostic arterial opacification. No evidence of arterial embolus. The pulmonary arteries and trunk are prominent consistent with arterial hypertension. The pulmonary trunk is 3.5 cm. There is moderate cardiomegaly with prominence of the right chambers consistent with right heart dysfunction possibly chronic, with IVC reflux. There is no pericardial effusion. There are scattered three-vessel coronary calcifications and a heavily calcified mitral ring. The central pulmonary veins are slightly prominent. There are scattered calcific plaques in the aorta and great vessels, mild tortuosity, without aneurysm, stenosis or dissection. There are scattered calcifications and slight thickening of the aortic valve leaflets. Mediastinum/Nodes: There is new multifocal mediastinal adenopathy. For example there is a left prevascular space node 1.4 cm in short axis on 4:29, right paratracheal adenopathy, largest of these measuring 1.7 cm on 4:34, and AP window lymph node is 1.3 cm on 4:34, and a subcarinal lymph node is 1.4 cm on 4:57. In the hila, there are enlarged lymph nodes on the right the largest of which is 1.6 cm on 4:50, single slightly prominent  left hilar lymph node 1.1 cm on 4:33. There is no bulky or encasing adenopathy or mass effect associated. This could be reactive adenopathy. Axillary spaces are clear. Thyroid  gland is not seen and could be above the plane of  imaging or atrophic in the interval. Thoracic trachea and main bronchi are patent. Thoracic esophagus unremarkable. Small hiatal hernia. Mildly prominent right paraesophageal lymph nodes in the lower mediastinum are also seen. Lungs/Pleura: There are symmetric small layering pleural effusions. There is mild interlobular septal thickening in the lung bases consistent with mild edema. Patchy consolidation noted in the dorsal inferior lingula and in left lower lobe consistent with pneumonia or aspiration intermixed with coarsely reticular atelectatic change. There is additional increased opacity in the anterior segment of the right upper lobe likely additional focal infiltrate. There is diffuse bronchial thickening which could be congestive or bronchitic. Respiratory motion is noted with no other focal lung opacities allowing for motion. No pneumothorax. Upper Abdomen: Moderate hepatic steatosis.  Remote cholecystectomy. There appears to be a mildly cirrhotic liver configuration, capsular nodularity in the left lobe. No splenomegaly or ascites. No acute findings. Musculoskeletal: Degenerative changes and mild kyphodextroscoliosis thoracic spine. No acute or other significant osseous findings or focal pathologic process. Unremarkable visualized chest wall. Review of the MIP images confirms the above findings. IMPRESSION: 1. No evidence of arterial embolus. 2. Prominent pulmonary arteries and trunk consistent with arterial hypertension. 3. Moderate cardiomegaly with prominence of the right chambers consistent with right heart dysfunction possibly chronic, with IVC reflux. 4. Aortic and coronary artery atherosclerosis. 5. Small pleural effusions with mild interlobular septal thickening in the lung bases consistent with mild edema. 6. Patchy consolidation in the dorsal lingula and left lower lobe consistent with pneumonia or aspiration intermixed with coarsely reticular atelectatic change. 7. Additional focal opacity in  the anterior segment of the right upper lobe likely additional focal infiltrate. 8. Diffuse bronchial thickening which could be congestive or bronchitic. 9. New multifocal mediastinal and right hilar adenopathy. This could be reactive. Follow-up CT recommended after treatment. 10. Hepatic steatosis with mildly cirrhotic liver configuration. No splenomegaly or ascites. 11. Small hiatal hernia. Aortic Atherosclerosis (ICD10-I70.0). Electronically Signed   By: Francis Quam M.D.   On: 09/19/2023 03:35   DG Chest Portable 1 View Result Date: 09/19/2023 CLINICAL DATA:  Hemoptysis, shortness of breath EXAM: PORTABLE CHEST 1 VIEW COMPARISON:  08/25/2023 FINDINGS: Cardiomegaly, vascular congestion. Diffuse interstitial prominence and bilateral lower lobe airspace opacities, likely edema. No effusions. No acute bony abnormality. IMPRESSION: Cardiomegaly with vascular congestion and mild to moderate pulmonary edema. Electronically Signed   By: Franky Crease M.D.   On: 09/19/2023 02:58    There are no new results to review at this time.  Previous records (including but not limited to H&P, progress notes, nursing notes, TOC management) were reviewed in assessment of this patient.  Labs: CBC: Recent Labs  Lab 10/16/23 1728 10/16/23 1751 10/17/23 0317 10/18/23 0305  WBC 8.5  --  7.3 10.0  HGB 10.6* 11.6*  11.9* 10.5* 9.8*  HCT 34.4* 34.0*  35.0* 34.7* 31.8*  MCV 91.2  --  90.6 89.6  PLT 183  --  194 189   Basic Metabolic Panel: Recent Labs  Lab 10/16/23 1728 10/16/23 1751 10/16/23 2352 10/17/23 0317 10/18/23 0305  NA 139 141  142  --  139 138  K 2.9* 2.9*  2.9*  --  3.5 3.2*  CL 98 96*  --  99 98  CO2 31  --   --  32 32  GLUCOSE 88 89  --  99 107*  BUN 17 18  --  16 18  CREATININE 1.10* 1.20* 1.01* 1.12* 1.16*  CALCIUM  8.7*  --   --  9.0 8.5*  MG  --   --   --   --  1.6*   Liver Function Tests: Recent Labs  Lab 10/17/23 0317  AST 27  ALT 15  ALKPHOS 75  BILITOT 1.3*  PROT 8.3*   ALBUMIN 2.3*   CBG: No results for input(s): GLUCAP in the last 168 hours.  Scheduled Meds:  acetaZOLAMIDE  ER  500 mg Oral BID   Chlorhexidine  Gluconate Cloth  6 each Topical Daily   citalopram   20 mg Oral Daily   enoxaparin  (LOVENOX ) injection  40 mg Subcutaneous QHS   feeding supplement  237 mL Oral BID BM   furosemide   60 mg Intravenous BID   levothyroxine   200 mcg Oral QAC breakfast   mupirocin  ointment  1 Application Nasal BID   potassium chloride   20 mEq Oral BID   umeclidinium bromide   1 puff Inhalation Daily   Continuous Infusions: PRN Meds:.hydrALAZINE , ipratropium-albuterol , ondansetron  **OR** ondansetron  (ZOFRAN ) IV  Family Communication: Son via telephone  Disposition: Status is: Inpatient Remains inpatient appropriate because: Respiratory failure, CHF exacerbation     Time spent: 50 minutes  Length of inpatient stay: 2 days  Author: Carliss LELON Canales, DO 10/18/2023 10:49 AM  For on call review [email or web address].

## 2023-10-18 NOTE — Consult Note (Signed)
 WOC team consulted for wounds to lower legs and feet. Patient with known history of lymphedema and has been seen by our team in the past.  Secure chat send to primary team requesting photos be uploaded for consult.   Please note that the [facility] nursing team is utilizing a standardized work plan to manage patient consults. We are triaging consults and will try to see the patients within 48 hours. Wound photos in the patient's chart allow us  to consult on the patient in the most efficient and timely manner.    Thank you,    Powell Bar MSN, RN-BC, [employer]

## 2023-10-18 NOTE — Progress Notes (Signed)
 Patient's chest tubed removed by [facility], RN and [place], RN from [facility] per order. Patient tolerated well. Site is WNL.

## 2023-10-19 DIAGNOSIS — E876 Hypokalemia: Secondary | ICD-10-CM | POA: Diagnosis not present

## 2023-10-19 DIAGNOSIS — J9621 Acute and chronic respiratory failure with hypoxia: Secondary | ICD-10-CM | POA: Diagnosis not present

## 2023-10-19 DIAGNOSIS — G9341 Metabolic encephalopathy: Secondary | ICD-10-CM | POA: Diagnosis not present

## 2023-10-19 DIAGNOSIS — I5033 Acute on chronic diastolic (congestive) heart failure: Secondary | ICD-10-CM | POA: Diagnosis not present

## 2023-10-19 DIAGNOSIS — E873 Alkalosis: Secondary | ICD-10-CM

## 2023-10-19 LAB — CBC
HCT: 31.1 % — ABNORMAL LOW (ref 36.0–46.0)
Hemoglobin: 9.5 g/dL — ABNORMAL LOW (ref 12.0–15.0)
MCH: 27.5 pg (ref 26.0–34.0)
MCHC: 30.5 g/dL (ref 30.0–36.0)
MCV: 90.1 fL (ref 80.0–100.0)
Platelets: 176 K/uL (ref 150–400)
RBC: 3.45 MIL/uL — ABNORMAL LOW (ref 3.87–5.11)
RDW: 16.4 % — ABNORMAL HIGH (ref 11.5–15.5)
WBC: 8.1 K/uL (ref 4.0–10.5)
nRBC: 0 % (ref 0.0–0.2)

## 2023-10-19 LAB — COMPREHENSIVE METABOLIC PANEL WITH GFR
ALT: 15 U/L (ref 0–44)
AST: 27 U/L (ref 15–41)
Albumin: 2.2 g/dL — ABNORMAL LOW (ref 3.5–5.0)
Alkaline Phosphatase: 66 U/L (ref 38–126)
Anion gap: 9 (ref 5–15)
BUN: 17 mg/dL (ref 8–23)
CO2: 31 mmol/L (ref 22–32)
Calcium: 8.5 mg/dL — ABNORMAL LOW (ref 8.9–10.3)
Chloride: 98 mmol/L (ref 98–111)
Creatinine, Ser: 1.24 mg/dL — ABNORMAL HIGH (ref 0.44–1.00)
GFR, Estimated: 47 mL/min — ABNORMAL LOW (ref >60)
Glucose, Bld: 85 mg/dL (ref 70–99)
Potassium: 3.5 mmol/L (ref 3.5–5.1)
Sodium: 138 mmol/L (ref 135–145)
Total Bilirubin: 0.9 mg/dL (ref 0.0–1.2)
Total Protein: 7 g/dL (ref 6.5–8.1)

## 2023-10-19 LAB — MAGNESIUM: Magnesium: 1.6 mg/dL — ABNORMAL LOW (ref 1.7–2.4)

## 2023-10-19 LAB — GLUCOSE, CAPILLARY: Glucose-Capillary: 147 mg/dL — ABNORMAL HIGH (ref 70–99)

## 2023-10-19 LAB — PHOSPHORUS: Phosphorus: 3.5 mg/dL (ref 2.5–4.6)

## 2023-10-19 MED ORDER — FLEET ENEMA RE ENEM: 1.0000 | ENEMA | Freq: Once | RECTAL | Status: DC | PRN

## 2023-10-19 MED ORDER — MAGNESIUM SULFATE 2 GM/50ML IV SOLN
2.0000 g | Freq: Once | INTRAVENOUS | Status: AC
  Administered 2023-10-19: 2 g via INTRAVENOUS
  Filled 2023-10-19: qty 50

## 2023-10-19 MED ORDER — HYDROCERIN EX CREA
TOPICAL_CREAM | Freq: Every day | CUTANEOUS | Status: DC
  Filled 2023-10-19: qty 113

## 2023-10-19 MED ORDER — BISACODYL 5 MG PO TBEC: 5.0000 mg | DELAYED_RELEASE_TABLET | Freq: Every day | ORAL | Status: DC | PRN

## 2023-10-19 MED ORDER — BISACODYL 10 MG RE SUPP: 10.0000 mg | Freq: Every day | RECTAL | Status: DC | PRN

## 2023-10-19 MED ORDER — POLYETHYLENE GLYCOL 3350 17 G PO PACK
17.0000 g | PACK | Freq: Every day | ORAL | Status: DC
  Administered 2023-10-19 – 2023-10-22 (×7): 17 g via ORAL
  Filled 2023-10-19 (×5): qty 1

## 2023-10-19 MED ORDER — SODIUM CHLORIDE 0.9 % IV SOLN: INTRAVENOUS | Status: AC

## 2023-10-19 NOTE — Progress Notes (Addendum)
   10/19/23 1145  Spiritual Encounters  Type of Visit Initial  Care provided to: Pt and family  Reason for visit Advance directives  OnCall Visit Yes   Chaplain responded to Page with AD questions. Chaplain met with Pt and her family about the AD document (which Pt and son had completed). Chaplain advised Pt and family to contact Chaplain office tomorrow (during regular business hours) so that Chaplain can bring notary and 2 witnesses to have the AD doc notarized. No spiritual need at this time.  Chaplain Therisa Samuel

## 2023-10-19 NOTE — Progress Notes (Signed)
 Progress Note   Patient: Morgan Odonnell FMW:[id number] DOB: April 02, 1950 DOA: 10/16/2023  DOS: the patient was seen and examined on 10/19/2023   Brief hospital course:  73 y.o. female with medical history significant of morbid obesity, chronic respiratory failure with hypoxia and hypercarbia, COPD, GERD, obstructive sleep apnea, hypothyroidism, obesity hypoventilation syndrome, depression, diastolic CHF, chronic back pain bilateral lymphedema essential hypertension among other things who was recently admitted to the hospital with acute on chronic respiratory failure and subsequently discharged to skilled facility.    Assessment and Plan:   Acute metabolic encephalopathy - Likely in the setting of hypercapnic respiratory failure.  Showing improvement.  Much more alert and conversive.  Appears to be resolved.  Acute hypoxic and hypercapnic respiratory failure - Initially did not tolerate BiPAP reportedly.  pCO2 elevated on presentation.  Placed on high flow.  Continues to show improvement.  Able to be weaned down to 3-4 L which is patient's baseline.  O2 sat goal 88-92%.  Given multiple exacerbations, patient would benefit from home CPAP or possibly even home BiPAP.  Discussion with son that patient does not own a CPAP machine.  Will work with TOC on hopefully being able to acquire one prior to discharge.   Acute exacerbation of HFpEF - Likely contributing to patient's hypoxia and hypercapnia.  Responding well to initial diuresis.  Continue IV Lasix  to 60 mg every 12 hours.  Continue Diamox  as noted below.  Monitor I's and O's.  Recheck BMP and magnesium  in AM.  Supplemental O2 as above.   Metabolic alkalosis - Likely combination of chronic hypercapnia as well as contraction alkalosis.  Continue Diamox  twice daily.  Adding NS at 50 mL/h.  Recheck BMP in AM.   Hypokalemia - Replenishment on board.  Will recheck BMP and mag in AM.   Hypertension - Initially very hypertensive.  However has  responded well to diuresis.  Previously initiated Coreg , now discontinued as patient now is borderline low blood pressures.     Lymphedema - Diuresis as above.  Consult for lymphedema wraps.   Obstructive sleep apnea/obesity hypoventilation - CPAP on board.  Attempting to encourage use of CPAP.   Physical debilitation muscle weakness - Evaluated by PT/OT.  Recommending SNF.  Working closely with TOC on disposition.   Goals of care - Working to obtain CPAP for patient.  Will likely need to be ordered by SNF prior to her discharge home.  Patient family also requested the patient pursue home health after transition out of SNF.   Subjective: Patient resting comfortably this morning.  Feeling well.  Denies any fever, chills, chest pain, nausea, vomiting, abdominal pain, worsening shortness of breath.  Did relate that she is not a fan of the rehab she was previously yet and would rather go home with home health.  Stated that there will be a discussion with her, her son, case management on her ultimate disposition planning.  Physical Exam:  Vitals:   10/19/23 0006 10/19/23 0018 10/19/23 0458 10/19/23 0832  BP:  (!) 121/54 (!) 110/46 (!) 90/47  Pulse: 71 66 67 72  Resp: (!) 21 17 15    Temp:  97.6 F (36.4 C) 98.2 F (36.8 C) 98 F (36.7 C)  TempSrc:  Axillary Oral Oral  SpO2: 94% 97% 92% 91%  Weight:   118.5 kg   Height:        GENERAL:  Alert, pleasant, disheveled, obese HEENT:  EOMI, nasal cannula CARDIOVASCULAR:  RRR, no murmurs appreciated RESPIRATORY: Poor air movement  bilaterally GASTROINTESTINAL:  Soft, nontender, nondistended EXTREMITIES: BL LE chronic lymphedema NEURO:  No new focal deficits appreciated SKIN:  No rashes noted PSYCH: Pleasant, appropriate   Data Reviewed:  Imaging Studies: CT Head Wo Contrast Result Date: 10/16/2023 CLINICAL DATA:  Mental status change EXAM: CT HEAD WITHOUT CONTRAST TECHNIQUE: Contiguous axial images were obtained from the base of the  skull through the vertex without intravenous contrast. RADIATION DOSE REDUCTION: This exam was performed according to the departmental dose-optimization program which includes automated exposure control, adjustment of the mA and/or kV according to patient size and/or use of iterative reconstruction technique. COMPARISON:  06/26/2017 FINDINGS: Brain: No acute territorial infarction, hemorrhage or intracranial mass. Interval mild atrophy. Mild chronic small vessel ischemic changes of the white matter. Interval ventricular enlargement, likely due to atrophy progression Vascular: No hyperdense vessels.  No unexpected calcification Skull: Normal. Negative for fracture or focal lesion. Sinuses/Orbits: No acute finding. Other: None IMPRESSION: 1. No CT evidence for acute intracranial abnormality. 2. Interval mild atrophy and chronic small vessel ischemic changes of the white matter. Interval mild ventricular enlargement, likely due to atrophy progression. Electronically Signed   By: Luke Bun M.D.   On: 10/16/2023 21:41   DG Chest Portable 1 View Result Date: 10/16/2023 CLINICAL DATA:  Shortness of breath EXAM: PORTABLE CHEST 1 VIEW COMPARISON:  Radiograph and CT 09/19/2023 FINDINGS: Similar cardiomegaly. Aortic atherosclerotic calcification. Pulmonary vascular congestion. Interstitial coarsening and hazy airspace opacities in the lower lungs. Question small left pleural effusion. No pneumothorax. IMPRESSION: 1. Findings most compatible with CHF right and pulmonary edema. Electronically Signed   By: Norman Gatlin M.D.   On: 10/16/2023 18:53    There are no new results to review at this time.  Previous records (including but not limited to H&P, progress notes, nursing notes, TOC management) were reviewed in assessment of this patient.  Labs: CBC: Recent Labs  Lab 10/16/23 1728 10/16/23 1751 10/17/23 0317 10/18/23 0305 10/19/23 0242  WBC 8.5  --  7.3 10.0 8.1  HGB 10.6* 11.6*  11.9* 10.5* 9.8* 9.5*   HCT 34.4* 34.0*  35.0* 34.7* 31.8* 31.1*  MCV 91.2  --  90.6 89.6 90.1  PLT 183  --  194 189 176   Basic Metabolic Panel: Recent Labs  Lab 10/16/23 1728 10/16/23 1751 10/16/23 2352 10/17/23 0317 10/18/23 0305 10/19/23 0242  NA 139 141  142  --  139 138 138  K 2.9* 2.9*  2.9*  --  3.5 3.2* 3.5  CL 98 96*  --  99 98 98  CO2 31  --   --  32 32 31  GLUCOSE 88 89  --  99 107* 85  BUN 17 18  --  16 18 17   CREATININE 1.10* 1.20* 1.01* 1.12* 1.16* 1.24*  CALCIUM  8.7*  --   --  9.0 8.5* 8.5*  MG  --   --   --   --  1.6* 1.6*  PHOS  --   --   --   --   --  3.5   Liver Function Tests: Recent Labs  Lab 10/17/23 0317 10/19/23 0242  AST 27 27  ALT 15 15  ALKPHOS 75 66  BILITOT 1.3* 0.9  PROT 8.3* 7.0  ALBUMIN 2.3* 2.2*   CBG: No results for input(s): GLUCAP in the last 168 hours.  Scheduled Meds:  acetaZOLAMIDE  ER  500 mg Oral BID   Chlorhexidine  Gluconate Cloth  6 each Topical Daily   citalopram   20 mg  Oral Daily   enoxaparin  (LOVENOX ) injection  40 mg Subcutaneous QHS   feeding supplement  237 mL Oral BID BM   furosemide   60 mg Intravenous BID   levothyroxine   200 mcg Oral QAC breakfast   mupirocin  ointment  1 Application Nasal BID   potassium chloride   20 mEq Oral BID   umeclidinium bromide   1 puff Inhalation Daily   Continuous Infusions:  sodium chloride  50 mL/hr at 10/19/23 0845   PRN Meds:.hydrALAZINE , ipratropium-albuterol , ondansetron  **OR** ondansetron  (ZOFRAN ) IV  Family Communication: None at bedside  Disposition: Status is: Inpatient Remains inpatient appropriate because: Respiratory failure, CHF exacerbation     Time spent: 38 minutes  Length of inpatient stay: 3 days  Author: Carliss LELON Canales, DO 10/19/2023 10:40 AM  For on call review [email or web address].

## 2023-10-19 NOTE — TOC Progression Note (Addendum)
 Transition of Care [facility]) - Progression Note    Patient Details  Name: Morgan Odonnell MRN: [id number] Date of Birth: 05/20/1950  Transition of Care [facility]) CM/SW Contact  Morgan Odonnell, LCSWA Phone Number: 10/19/2023, 10:25 AM  Clinical Narrative:     Due to patients current orientation CSW spoke with patients son Morgan Odonnell and provided SNF bed offers. Patients son request that CSW leave medicare compare list of accepted SNF bed offers in patients room to review. Patients son informed CSW he will review and give CSW a call back today with SNF choice. MD informed CSW that patient will need Bipap at SNF. CSW dropped off medicare compare list of accepted SNF bed offers in patients room for patients son to review. CSW will continue to follow and assist with patients dc planning needs.  Update- CSW spoke with patient and patients son Morgan Odonnell at bedside. Morgan Odonnell had some questions regarding HCPOA paperwork. CSW informed patients son that CSW will reach out to Chaplain to stop by and help assist with questions regarding paperwork. CSW paged Chaplain and Chaplain Morgan Odonnell returned CSWs page. Morgan Odonnell confirmed she will go see patients son now to assist with HCPOA questions. Patients son still reviewing SNF bed offers. CSW will continue to follow.  Update- CSW received call back from [place] patients son. Patients son accepted SNF bed for with [facility] for patient. CSW confirmed SNF bed with [place] with [facility]. CSW informed Morgan Odonnell that patient will need Bipap at SNF. Morgan Odonnell confirmed she has settings for Bipap. Morgan Odonnell informed CSW that if patient has not had outpatient sleep study that she will need one in order for facility to order Bipap for patient for home. CSW informed MD. CSW will continue to follow.  Update- CSW started insurance authorization for patient Auth ID# [id number]. Patients insurance authorization for SNF currently pending.  Expected Discharge Plan: Skilled Nursing  Facility Barriers to Discharge: Continued Medical Work up, SNF Pending bed offer, English as a second language teacher               Expected Discharge Plan and Services In-house Referral: Clinical Social Work     Living arrangements for the past 2 months: Single Family Home                                       Social Drivers of Health (SDOH) Interventions SDOH Screenings   Food Insecurity: No Food Insecurity (10/16/2023)  Housing: Low Risk  (10/16/2023)  Transportation Needs: No Transportation Needs (10/16/2023)  Utilities: Not At Risk (10/16/2023)  Depression (PHQ2-9): Medium Risk (01/13/2019)  Social Connections: Moderately Integrated (10/16/2023)  Tobacco Use: Medium Risk (10/16/2023)    Readmission Risk Interventions    09/19/2023    1:52 PM 08/17/2023    9:31 AM  Readmission Risk Prevention Plan  Transportation Screening Complete Complete  PCP or Specialist Appt within 5-7 Days  Complete  PCP or Specialist Appt within 3-5 Days Complete   Home Care Screening  Complete  Medication Review (RN CM)  Complete  HRI or Home Care Consult Complete   Social Work Consult for Recovery Care Planning/Counseling Complete   Palliative Care Screening Complete   Medication Review Oceanographer) Complete

## 2023-10-19 NOTE — Consult Note (Addendum)
 WOC Nurse Consult Note: this patient is known to [facility] team from previous consults; has a longstanding history of lymphedema  (since a teenager); has been followed at wound care center (last seen 12/2022); orders by vascular for ABI but no notes or results found that these were ever performed; also no notes of a visit in a vascular office found Reason for Consult: wounds to legs r/t lymphedema  Wound type: full thickness R ankle red and moist; L posterior heel red and moist  Pressure Injury POA: NA  Measurement: see nursing flowsheet  Wound bed: as above  Drainage (amount, consistency, odor) see nursing flowsheet  Periwound: patient with changes consistent with chronic lymphedema; thickened hyperkeratotic skin  Dressing procedure/placement/frequency: Cleanse lower legs with soap and water , dry and apply Eucerin to intact skin.  Cleanse any open wounds to lowers legs (including R ankle and L posterior heel) with NS, apply silver hydrofiber (Aquacel ANDRIA Collum [id number]) cut to fit wound beds daily, cover with ABD pads and secure with Kerlix roll gauze beginning right above toes and ending right below knees.  Apply Ace bandage wrapped in same fashion as Kerlix for light compression.   Patient would benefit from ongoing management of lymphedema at wound care center, vascular surgeon or lymphedema clinic.  ABIs to determine appropriate level of compression will be important.    POC discussed with bedside nurse. WOC team will not follow. Reconsult if further needs arise.   Thank you,    Powell Bar MSN, RN-BC, CWOCN  Lymphedema  Resources (updated 01/2021) Each site requires a referral from your primary care MD [facility] [address] [place], [place]  [phone] (Upper extremities)  [address] [place], [place] [phone] (Lower extremities, PATIENT CAN NOT HAVE A WOUND)  [facility] [address] [place], [place]  [address] [phone]  [facility] [address] Medical Office Building [address]  [place], [place] [phone]  [facility] [address] [place], [place] [address] [phone]  [facility]  (only treatment for lymphedema related to cancer diagnosis) [address]  [place], [place] [address] [phone]    [facility] [address] [place], [place] [address] [phone] [facility] ([facility]) [phone] [address] [place], [place] [address] [phone]

## 2023-10-19 NOTE — Plan of Care (Signed)
   Problem: Health Behavior/Discharge Planning: Goal: Ability to manage health-related needs will improve Outcome: Not Progressing

## 2023-10-20 DIAGNOSIS — G9341 Metabolic encephalopathy: Secondary | ICD-10-CM | POA: Diagnosis not present

## 2023-10-20 DIAGNOSIS — E876 Hypokalemia: Secondary | ICD-10-CM | POA: Diagnosis not present

## 2023-10-20 DIAGNOSIS — J9621 Acute and chronic respiratory failure with hypoxia: Secondary | ICD-10-CM | POA: Diagnosis not present

## 2023-10-20 DIAGNOSIS — I5033 Acute on chronic diastolic (congestive) heart failure: Secondary | ICD-10-CM | POA: Diagnosis not present

## 2023-10-20 LAB — CBC
HCT: 30.2 % — ABNORMAL LOW (ref 36.0–46.0)
Hemoglobin: 9.2 g/dL — ABNORMAL LOW (ref 12.0–15.0)
MCH: 27.3 pg (ref 26.0–34.0)
MCHC: 30.5 g/dL (ref 30.0–36.0)
MCV: 89.6 fL (ref 80.0–100.0)
Platelets: 178 K/uL (ref 150–400)
RBC: 3.37 MIL/uL — ABNORMAL LOW (ref 3.87–5.11)
RDW: 16.4 % — ABNORMAL HIGH (ref 11.5–15.5)
WBC: 8.9 K/uL (ref 4.0–10.5)
nRBC: 0 % (ref 0.0–0.2)

## 2023-10-20 LAB — BASIC METABOLIC PANEL WITH GFR
Anion gap: 8 (ref 5–15)
BUN: 14 mg/dL (ref 8–23)
CO2: 32 mmol/L (ref 22–32)
Calcium: 8.5 mg/dL — ABNORMAL LOW (ref 8.9–10.3)
Chloride: 97 mmol/L — ABNORMAL LOW (ref 98–111)
Creatinine, Ser: 1.08 mg/dL — ABNORMAL HIGH (ref 0.44–1.00)
GFR, Estimated: 55 mL/min — ABNORMAL LOW (ref >60)
Glucose, Bld: 107 mg/dL — ABNORMAL HIGH (ref 70–99)
Potassium: 3.2 mmol/L — ABNORMAL LOW (ref 3.5–5.1)
Sodium: 137 mmol/L (ref 135–145)

## 2023-10-20 LAB — GLUCOSE, CAPILLARY: Glucose-Capillary: 115 mg/dL — ABNORMAL HIGH (ref 70–99)

## 2023-10-20 LAB — MAGNESIUM: Magnesium: 1.8 mg/dL (ref 1.7–2.4)

## 2023-10-20 MED ORDER — POTASSIUM CHLORIDE CRYS ER 20 MEQ PO TBCR
40.0000 meq | EXTENDED_RELEASE_TABLET | Freq: Once | ORAL | Status: AC
  Administered 2023-10-20 (×2): 40 meq via ORAL
  Filled 2023-10-20: qty 2

## 2023-10-20 MED ORDER — ENOXAPARIN SODIUM 60 MG/0.6ML IJ SOSY
60.0000 mg | PREFILLED_SYRINGE | Freq: Every day | INTRAMUSCULAR | Status: DC
  Administered 2023-10-20 – 2023-10-23 (×6): 60 mg via SUBCUTANEOUS
  Filled 2023-10-20 (×4): qty 0.6

## 2023-10-20 NOTE — Care Management Important Message (Signed)
 Important Message  Patient Details  Name: Morgan Odonnell MRN: [id number] Date of Birth: 1951/01/28   Important Message Given:  Yes - Medicare IM     [name] 10/20/2023, 10:48 AM

## 2023-10-20 NOTE — Progress Notes (Signed)
 Physical Therapy Treatment Patient Details Name: Morgan Odonnell MRN: [id number] DOB: Jul 25, 1950 Today's Date: 10/20/2023   History of Present Illness 73 y.o female admitted 10/16/23 with AMS and Rt pulmonary edema. PMH: admission 7/11-7/18 with D/C to SNF, return home 8/3. CHF, OSA, COPD, hypothyroidism, obesity, chronic respiratory failure, depression,chronic back pain, HTN    PT Comments  Pt agreeable to participate in physical therapy session. SpO2 92% on 3L O2 at rest, bumped up to 4L O2 with mobility to sustain at 89-90%. Pt requiring +2 assist to maneuver to left side of bed. Demonstrates fair sitting balance and performed functional reaching and LE exercise. Patient will benefit from continued inpatient follow up therapy, <3 hours/day in order to address deficits, maximize functional mobility and decrease caregiver burden.    If plan is discharge home, recommend the following: Two people to help with walking and/or transfers;Assist for transportation;Supervision due to cognitive status;Two people to help with bathing/dressing/bathroom;Direct supervision/assist for financial management;Direct supervision/assist for medications management   Can travel by private vehicle     No  Equipment Recommendations  Hoyer lift    Recommendations for Other Services       Precautions / Restrictions Precautions Precautions: Fall Recall of Precautions/Restrictions: Impaired Restrictions Weight Bearing Restrictions Per Provider Order: No     Mobility  Bed Mobility Overal bed mobility: Needs Assistance Bed Mobility: Rolling, Sit to Supine, Supine to Sit Rolling: Mod assist, Max assist   Supine to sit: Max assist, +2 for physical assistance Sit to supine: Mod assist, +2 for physical assistance   General bed mobility comments: HOB significantly elevated, max cues for reaching RUE across body for bed rail, assist for BLE's off edge of bed and trunk to upright. Provided LE elevation back into  bed    Transfers                   General transfer comment: will require lift at this time    Ambulation/Gait                   Stairs             Wheelchair Mobility     Tilt Bed    Modified Rankin (Stroke Patients Only)       Balance Overall balance assessment: Needs assistance Sitting-balance support: No upper extremity supported, Feet supported Sitting balance-Leahy Scale: Fair                                      Hotel manager: No apparent difficulties  Cognition Arousal: Alert Behavior During Therapy: Flat affect   PT - Cognitive impairments: Awareness, Memory, Problem solving, Safety/Judgement                       PT - Cognition Comments: Slow processing Following commands: Impaired Following commands impaired: Follows one step commands with increased time    Cueing Cueing Techniques: Verbal cues, Tactile cues  Exercises General Exercises - Lower Extremity Long Arc Quad: AROM, Both, 10 reps, Seated Other Exercises Other Exercises: Sitting: functional reaching with BUE's    General Comments        Pertinent Vitals/Pain Pain Assessment Pain Assessment: No/denies pain    Home Living                          Prior Function  PT Goals (current goals can now be found in the care plan section) Acute Rehab PT Goals Patient Stated Goal: get some help Potential to Achieve Goals: Poor Progress towards PT goals: Progressing toward goals    Frequency    Min 1X/week      PT Plan      Co-evaluation              AM-PAC PT 6 Clicks Mobility   Outcome Measure  Help needed turning from your back to your side while in a flat bed without using bedrails?: Total Help needed moving from lying on your back to sitting on the side of a flat bed without using bedrails?: Total Help needed moving to and from a bed to a chair (including a wheelchair)?:  Total Help needed standing up from a chair using your arms (e.g., wheelchair or bedside chair)?: Total Help needed to walk in hospital room?: Total Help needed climbing 3-5 steps with a railing? : Total 6 Click Score: 6    End of Session Equipment Utilized During Treatment: Oxygen  Activity Tolerance: Patient tolerated treatment well Patient left: in bed;with call bell/phone within reach Nurse Communication: Mobility status PT Visit Diagnosis: Muscle weakness (generalized) (M62.81);Difficulty in walking, not elsewhere classified (R26.2)     Time: 8483-8461 PT Time Calculation (min) (ACUTE ONLY): 22 min  Charges:    $Therapeutic Activity: 8-22 mins PT General Charges $$ ACUTE PT VISIT: 1 Visit                     Aleck Odonnell, PT, DPT Acute Rehabilitation Services Office [phone]    Morgan Odonnell 10/20/2023, 4:05 PM

## 2023-10-20 NOTE — TOC Progression Note (Addendum)
 Transition of Care [facility]) - Progression Note    Patient Details  Name: Morgan Odonnell MRN: [id number] Date of Birth: 16-Mar-1950  Transition of Care [facility]) CM/SW Contact  Luise JAYSON Pan, [place] Phone Number: 10/20/2023, 8:52 AM  Clinical Narrative:   Shara still pending at this time.   10:00AM CSW spoke with patients son, [name], and informed him that authorization is still pending at this time. Lawrence inquired about who can set up [facility] when patient discharges from SNF and if insurance covers it. CSW informed [name] that the facility can coordinate HH at discharge from them and insurance does cover it if it is HHPT/OT/RN etc. CSW informed [name] that insurance does not cover custodial care home care (I.e. bathing, feedng, etc) and that is private pay. [name] stated he understands.   10:27 AM CSW spoke with Tammy with [facility] regarding patietns disposition. [facility] SN[facility] needs clarity on patient needing a bipap or cpap. CSW inquired w/ MD and stated patient will be needing cpap at this time. CSW updated Tammy and provided most recent settings from respiratory. Facility to order cpap for facility stay.   Per Tammy, in order for a cpap to be ordered for home patient will need a sleep study done. Otherwise, facility will not be able to order one. CSW informed MD. MD aware and will place sleep study referral on DC summary.   2:23 PM CSW called [employer] to check on insurance auth. Per [employer], insurance has denied due to patient not being medically ready for discharge. Per [employer], CSW can resubmit for auth once patient is more medically stable. CSW notified MD. CSW messaged PT for an updated PT note.   Per Tammy with [facility], cpap machine will be delivered to facility tomorrow 8/11.   2:59 PM CSW spoke with [name] about SNF auth denial but that ins stated it is because patient did not appear medically stable. Lawrence expressed his understanding. Lawrence inquired about patient  going to another facility. CSW reviewed current bed offers for SNF, [name] stated he would call CSW back once he spoke with a family member.   3:03 PM Lawrence called CSW back and stated he would like to stick with [facility] at this time. CSW infromed [name] that MD will place an outpatient sleep study referral so that patient can be evaluated for a home cpap machine.   4:16 PM CSW resubmitted for SNF auth at this time. Auth id [id number]. Shara is pending for [facility].   CSW will continue to follow.    Expected Discharge Plan: Skilled Nursing Facility Barriers to Discharge: Continued Medical Work up, SNF Pending bed offer, English as a second language teacher               Expected Discharge Plan and Services In-house Referral: Clinical Social Work     Living arrangements for the past 2 months: Single Family Home                                       Social Drivers of Health (SDOH) Interventions SDOH Screenings   Food Insecurity: No Food Insecurity (10/16/2023)  Housing: Low Risk  (10/16/2023)  Transportation Needs: No Transportation Needs (10/16/2023)  Utilities: Not At Risk (10/16/2023)  Depression (PHQ2-9): Medium Risk (01/13/2019)  Social Connections: Moderately Integrated (10/16/2023)  Tobacco Use: Medium Risk (10/16/2023)    Readmission Risk Interventions    09/19/2023  1:52 PM 08/17/2023    9:31 AM  Readmission Risk Prevention Plan  Transportation Screening Complete Complete  PCP or Specialist Appt within 5-7 Days  Complete  PCP or Specialist Appt within 3-5 Days Complete   Home Care Screening  Complete  Medication Review (RN CM)  Complete  HRI or Home Care Consult Complete   Social Work Consult for Recovery Care Planning/Counseling Complete   Palliative Care Screening Complete   Medication Review Oceanographer) Complete

## 2023-10-20 NOTE — Plan of Care (Addendum)
 Pt generally bed bound, may need turning, alert to A&O x3, but not to situation why she's here. Previously suffered from encephalopathy, but seems substantially more oriented now from this morning.

## 2023-10-20 NOTE — Progress Notes (Signed)
 Progress Note   Patient: Morgan Odonnell FMW:[id number] DOB: January 09, 1951 DOA: 10/16/2023  DOS: the patient was seen and examined on 10/20/2023   Brief hospital course:  73 y.o. female with medical history significant of morbid obesity, chronic respiratory failure with hypoxia and hypercarbia, COPD, GERD, obstructive sleep apnea, hypothyroidism, obesity hypoventilation syndrome, depression, diastolic CHF, chronic back pain bilateral lymphedema essential hypertension among other things who was recently admitted to the hospital with acute on chronic respiratory failure and subsequently discharged to skilled facility.    Assessment and Plan:   Acute metabolic encephalopathy - Likely in the setting of hypercapnic respiratory failure.  Showing improvement.  Much more alert and conversive.  Appears to be resolved.   Acute hypoxic and hypercapnic respiratory failure - Initially did not tolerate BiPAP reportedly.  pCO2 elevated on presentation.  Placed on high flow.  Continues to show improvement.  Able to be weaned down to 3-4 L which is patient's baseline.  O2 sat goal 88-92%.  Given multiple exacerbations, patient would benefit from home CPAP.  Discussion with son that patient does not own a CPAP machine.  Will work with TOC on hopefully being able to acquire one prior to discharge.  Will send referral for pulmonology upon discharge for sleep study.  Patient will be provided CPAP at SNF on discharge.   Acute exacerbation of HFpEF - Likely contributing to patient's hypoxia and hypercapnia.  Responding well to initial diuresis.  Continue IV Lasix  to 60 mg every 12 hours.  Continue Diamox  as noted below.  Monitor I's and O's.  Recheck BMP and magnesium  in AM.  Supplemental O2 as above.   Metabolic alkalosis - Likely combination of chronic hypercapnia as well as contraction alkalosis.  Continue Diamox  twice daily.  Adding NS at 50 mL/h.  Recheck BMP in AM.   Hypokalemia - Replenishment on board.  Will  recheck BMP and mag in AM.  Acute kidney injury (POA) - Creatinine elevated above baseline (0.6).  Initiated on IV fluids with diuresis as above.  Showing improvement this morning.  Will recheck BMP in AM.   Hypertension - Initially very hypertensive.  However has responded well to diuresis.  Previously initiated Coreg , now discontinued as patient now is borderline low blood pressures.     Lymphedema - Diuresis as above.  Now has lymphedema wraps.   Obstructive sleep apnea/obesity hypoventilation - CPAP on board.  Attempting to encourage use of CPAP.   Physical debilitation muscle weakness - Evaluated by PT/OT.  Recommending SNF.  Working closely with TOC on disposition.   Goals of care - Working to obtain CPAP for patient.  Will likely need to be ordered by SNF prior to her discharge home.  Patient family also requested the patient pursue home health after transition out of SNF.   Subjective: Patient resting comfortably this morning.  Family at bedside.  Discussion about transition out of the hospital to SNF.  Would benefit the patient to have CPAP at SNF as well as work on obtaining sleep study so that patient can have CPAP at home as well.  Currently denies any worsening shortness of breath, chest pain, nausea, vomiting, abdominal pain.  Physical Exam:  Vitals:   10/19/23 2333 10/20/23 0455 10/20/23 0727 10/20/23 0920  BP: (!) 118/53 (!) 111/47 (!) 107/49 (!) 106/47  Pulse: 74     Resp: 19 20    Temp: 98 F (36.7 C) 98.1 F (36.7 C) 98.5 F (36.9 C)   TempSrc: Oral Oral Oral  SpO2: 98% 98% 90%   Weight:      Height:        GENERAL:  Alert, pleasant, disheveled, obese HEENT:  EOMI, nasal cannula CARDIOVASCULAR:  RRR, no murmurs appreciated RESPIRATORY: Poor air movement bilaterally GASTROINTESTINAL:  Soft, nontender, nondistended EXTREMITIES: BL LE chronic lymphedema NEURO:  No new focal deficits appreciated SKIN:  No rashes noted PSYCH: Pleasant,  appropriate   Data Reviewed:  Imaging Studies: CT Head Wo Contrast Result Date: 10/16/2023 CLINICAL DATA:  Mental status change EXAM: CT HEAD WITHOUT CONTRAST TECHNIQUE: Contiguous axial images were obtained from the base of the skull through the vertex without intravenous contrast. RADIATION DOSE REDUCTION: This exam was performed according to the departmental dose-optimization program which includes automated exposure control, adjustment of the mA and/or kV according to patient size and/or use of iterative reconstruction technique. COMPARISON:  06/26/2017 FINDINGS: Brain: No acute territorial infarction, hemorrhage or intracranial mass. Interval mild atrophy. Mild chronic small vessel ischemic changes of the white matter. Interval ventricular enlargement, likely due to atrophy progression Vascular: No hyperdense vessels.  No unexpected calcification Skull: Normal. Negative for fracture or focal lesion. Sinuses/Orbits: No acute finding. Other: None IMPRESSION: 1. No CT evidence for acute intracranial abnormality. 2. Interval mild atrophy and chronic small vessel ischemic changes of the white matter. Interval mild ventricular enlargement, likely due to atrophy progression. Electronically Signed   By: Luke Bun M.D.   On: 10/16/2023 21:41   DG Chest Portable 1 View Result Date: 10/16/2023 CLINICAL DATA:  Shortness of breath EXAM: PORTABLE CHEST 1 VIEW COMPARISON:  Radiograph and CT 09/19/2023 FINDINGS: Similar cardiomegaly. Aortic atherosclerotic calcification. Pulmonary vascular congestion. Interstitial coarsening and hazy airspace opacities in the lower lungs. Question small left pleural effusion. No pneumothorax. IMPRESSION: 1. Findings most compatible with CHF right and pulmonary edema. Electronically Signed   By: Norman Gatlin M.D.   On: 10/16/2023 18:53    There are no new results to review at this time.  Previous records (including but not limited to H&P, progress notes, nursing notes, TOC  management) were reviewed in assessment of this patient.  Labs: CBC: Recent Labs  Lab 10/16/23 1728 10/16/23 1751 10/17/23 0317 10/18/23 0305 10/19/23 0242 10/20/23 0309  WBC 8.5  --  7.3 10.0 8.1 8.9  HGB 10.6* 11.6*  11.9* 10.5* 9.8* 9.5* 9.2*  HCT 34.4* 34.0*  35.0* 34.7* 31.8* 31.1* 30.2*  MCV 91.2  --  90.6 89.6 90.1 89.6  PLT 183  --  194 189 176 178   Basic Metabolic Panel: Recent Labs  Lab 10/16/23 1728 10/16/23 1751 10/16/23 2352 10/17/23 0317 10/18/23 0305 10/19/23 0242 10/20/23 0309  NA 139 141  142  --  139 138 138 137  K 2.9* 2.9*  2.9*  --  3.5 3.2* 3.5 3.2*  CL 98 96*  --  99 98 98 97*  CO2 31  --   --  32 32 31 32  GLUCOSE 88 89  --  99 107* 85 107*  BUN 17 18  --  16 18 17 14   CREATININE 1.10* 1.20* 1.01* 1.12* 1.16* 1.24* 1.08*  CALCIUM  8.7*  --   --  9.0 8.5* 8.5* 8.5*  MG  --   --   --   --  1.6* 1.6* 1.8  PHOS  --   --   --   --   --  3.5  --    Liver Function Tests: Recent Labs  Lab 10/17/23 0317 10/19/23 0242  AST  27 27  ALT 15 15  ALKPHOS 75 66  BILITOT 1.3* 0.9  PROT 8.3* 7.0  ALBUMIN 2.3* 2.2*   CBG: Recent Labs  Lab 10/19/23 2114 10/20/23 0645  GLUCAP 147* 115*    Scheduled Meds:  acetaZOLAMIDE  ER  500 mg Oral BID   Chlorhexidine  Gluconate Cloth  6 each Topical Daily   citalopram   20 mg Oral Daily   enoxaparin  (LOVENOX ) injection  60 mg Subcutaneous QHS   feeding supplement  237 mL Oral BID BM   furosemide   60 mg Intravenous BID   hydrocerin   Topical Daily   levothyroxine   200 mcg Oral QAC breakfast   mupirocin  ointment  1 Application Nasal BID   polyethylene glycol  17 g Oral Daily   umeclidinium bromide   1 puff Inhalation Daily   Continuous Infusions: PRN Meds:.bisacodyl , bisacodyl , hydrALAZINE , ipratropium-albuterol , ondansetron  **OR** ondansetron  (ZOFRAN ) IV, sodium phosphate   Family Communication: At bedside  Disposition: Status is: Inpatient Remains inpatient appropriate because: Respiratory  failure     Time spent: 36 minutes  Length of inpatient stay: 4 days  Author: Carliss LELON Canales, DO 10/20/2023 10:23 AM  For on call review [email or web address].

## 2023-10-21 DIAGNOSIS — J9621 Acute and chronic respiratory failure with hypoxia: Secondary | ICD-10-CM | POA: Diagnosis not present

## 2023-10-21 DIAGNOSIS — G4733 Obstructive sleep apnea (adult) (pediatric): Secondary | ICD-10-CM

## 2023-10-21 DIAGNOSIS — G9341 Metabolic encephalopathy: Secondary | ICD-10-CM | POA: Diagnosis not present

## 2023-10-21 DIAGNOSIS — E876 Hypokalemia: Secondary | ICD-10-CM | POA: Diagnosis not present

## 2023-10-21 DIAGNOSIS — I5033 Acute on chronic diastolic (congestive) heart failure: Secondary | ICD-10-CM | POA: Diagnosis not present

## 2023-10-21 LAB — BASIC METABOLIC PANEL WITH GFR
Anion gap: 6 (ref 5–15)
BUN: 14 mg/dL (ref 8–23)
CO2: 31 mmol/L (ref 22–32)
Calcium: 8.8 mg/dL — ABNORMAL LOW (ref 8.9–10.3)
Chloride: 99 mmol/L (ref 98–111)
Creatinine, Ser: 0.97 mg/dL (ref 0.44–1.00)
GFR, Estimated: >60 mL/min (ref >60)
Glucose, Bld: 90 mg/dL (ref 70–99)
Potassium: 3.3 mmol/L — ABNORMAL LOW (ref 3.5–5.1)
Sodium: 136 mmol/L (ref 135–145)

## 2023-10-21 LAB — CBC
HCT: 29.9 % — ABNORMAL LOW (ref 36.0–46.0)
Hemoglobin: 9 g/dL — ABNORMAL LOW (ref 12.0–15.0)
MCH: 27.4 pg (ref 26.0–34.0)
MCHC: 30.1 g/dL (ref 30.0–36.0)
MCV: 90.9 fL (ref 80.0–100.0)
Platelets: 156 K/uL (ref 150–400)
RBC: 3.29 MIL/uL — ABNORMAL LOW (ref 3.87–5.11)
RDW: 16.1 % — ABNORMAL HIGH (ref 11.5–15.5)
WBC: 8.9 K/uL (ref 4.0–10.5)
nRBC: 0 % (ref 0.0–0.2)

## 2023-10-21 LAB — MAGNESIUM: Magnesium: 1.9 mg/dL (ref 1.7–2.4)

## 2023-10-21 MED ORDER — ADULT MULTIVITAMIN W/MINERALS CH
1.0000 | ORAL_TABLET | Freq: Every day | ORAL | Status: DC
  Administered 2023-10-21 – 2023-10-23 (×5): 1 via ORAL
  Filled 2023-10-21 (×3): qty 1

## 2023-10-21 MED ORDER — POTASSIUM CHLORIDE 20 MEQ PO PACK
40.0000 meq | PACK | Freq: Two times a day (BID) | ORAL | Status: AC
  Administered 2023-10-21 (×4): 40 meq via ORAL
  Filled 2023-10-21 (×2): qty 2

## 2023-10-21 MED ORDER — FUROSEMIDE 40 MG PO TABS
40.0000 mg | ORAL_TABLET | Freq: Two times a day (BID) | ORAL | Status: DC
  Administered 2023-10-21 – 2023-10-23 (×8): 40 mg via ORAL
  Filled 2023-10-21 (×5): qty 1

## 2023-10-21 NOTE — TOC Progression Note (Addendum)
 Transition of Care [facility]) - Progression Note    Patient Details  Name: Morgan Odonnell MRN: [id number] Date of Birth: 10/21/1950  [facility]) CM/SW Contact  Luise JAYSON Pan, [place] Phone Number: 10/21/2023, 10:15 AM  Clinical Narrative:   Insurance called CSW to inform that patient is still on IV lasix  at this time and CSW will either need to:  1) Resubmit for auth once patient is off IV lasix . 2) Submit MD note stating patient is off IV lasix  by today.  CSW communicated above information to MD. Per MD, progress note will state position will be transitioned to PO lasix . CSW communicated information to insurance.   Plan: Awaiting MD note to submit to insurance   1:08 PM CSW uploaded updated MD note to [facility].   CSW will continue to follow.   Expected Discharge Plan: Skilled Nursing Facility Barriers to Discharge: Continued Medical Work up, SNF Pending bed offer, English as a second language teacher               Expected Discharge Plan and Services In-house Referral: Clinical Social Work     Living arrangements for the past 2 months: Single Family Home                                       Social Drivers of Health (SDOH) Interventions SDOH Screenings   Food Insecurity: No Food Insecurity (10/16/2023)  Housing: Low Risk  (10/16/2023)  Transportation Needs: No Transportation Needs (10/16/2023)  Utilities: Not At Risk (10/16/2023)  Depression (PHQ2-9): Medium Risk (01/13/2019)  Social Connections: Moderately Integrated (10/16/2023)  Tobacco Use: Medium Risk (10/16/2023)    Readmission Risk Interventions    09/19/2023    1:52 PM 08/17/2023    9:31 AM  Readmission Risk Prevention Plan  Transportation Screening Complete Complete  PCP or Specialist Appt within 5-7 Days  Complete  PCP or Specialist Appt within 3-5 Days Complete   Home Care Screening  Complete  Medication Review (RN CM)  Complete  HRI or Home Care Consult Complete   Social Work Consult for Recovery  Care Planning/Counseling Complete   Palliative Care Screening Complete   Medication Review Oceanographer) Complete

## 2023-10-21 NOTE — Significant Event (Signed)
 Patient wants her CODE STATUS changed to full code.  I talked to the patient and confirmed.   Morgan Odonnell.

## 2023-10-21 NOTE — Plan of Care (Signed)
  Problem: Education: Goal: Knowledge of General Education information will improve Description: Including pain rating scale, medication(s)/side effects and non-pharmacologic comfort measures Outcome: Progressing   Problem: Clinical Measurements: Goal: Will remain free from infection Outcome: Progressing   Problem: Nutrition: Goal: Adequate nutrition will be maintained Outcome: Progressing   

## 2023-10-21 NOTE — Progress Notes (Signed)
 Progress Note   Patient: Morgan Odonnell FMW:[id number] DOB: 1950/07/06 DOA: 10/16/2023  DOS: the patient was seen and examined on 10/21/2023   Brief hospital course:  73 y.o. female with medical history significant of morbid obesity, chronic respiratory failure with hypoxia and hypercarbia, COPD, GERD, obstructive sleep apnea, hypothyroidism, obesity hypoventilation syndrome, depression, diastolic CHF, chronic back pain bilateral lymphedema essential hypertension among other things who was recently admitted to the hospital with acute on chronic respiratory failure and subsequently discharged to skilled facility.    Assessment and Plan:   Acute metabolic encephalopathy - Likely in the setting of hypercapnic respiratory failure.  Showing improvement.  Much more alert and conversive.  Appears to be resolved.   Acute hypoxic and hypercapnic respiratory failure - Initially did not tolerate BiPAP reportedly.  pCO2 elevated on presentation.  Placed on high flow.  Continues to show improvement.  Able to be weaned down to 3-4 L which is patient's baseline.  O2 sat goal 88-92%.  Given multiple exacerbations, patient would benefit from home CPAP.  Discussion with son that patient does not own a CPAP machine.  Will work with TOC on hopefully being able to acquire one prior to discharge.  Will send referral for pulmonology upon discharge for sleep study.  Patient will be provided CPAP at SNF on discharge.   Acute exacerbation of HFpEF - Likely contributing to patient's hypoxia and hypercapnia.  Responding well to initial IV diuresis with IV Lasix  60 mg every 12 hours.  Will transition this to p.o. Lasix  40 mg twice daily for now.  Continue p.o. Diamox  as noted below.  Plan to transition to p.o. Lasix  40 mg daily upon discharge and discontinued Diamox .  Monitor I's and O's.  Recheck BMP and magnesium  in AM.  Supplemental O2 as above.   Metabolic alkalosis - Likely combination of chronic hypercapnia as well  as contraction alkalosis.  Bonding well to Diamox  twice daily plus NS at 50 mL/h.  Recheck BMP in AM.   Hypokalemia - Replenishment on board.  Scheduled potassium on board.  Will recheck BMP and mag in AM.   Acute kidney injury (POA) - Creatinine elevated above baseline (0.6).  Initiated on IV fluids with diuresis as above.  Showing improvement again this morning.  Appears resolved.  Will recheck BMP in AM.   Hypertension - Initially very hypertensive.  However has responded well to diuresis.  Previously initiated Coreg , now discontinued as patient now is borderline low blood pressures.     Lymphedema - Diuresis as above.  Now has lymphedema wraps.   Obstructive sleep apnea/obesity hypoventilation - CPAP on board.  Attempting to encourage use of CPAP.   Physical debilitation muscle weakness - Evaluated by PT/OT.  Recommending SNF.  Working closely with TOC on disposition.   Goals of care - Working to obtain CPAP for patient.  Will likely need to be ordered by SNF prior to her discharge home.  Patient family also requested the patient pursue home health after transition out of SNF.     Subjective: Patient resting comfortably this morning, husband at bedside.  Currently denies any fever, chills, chest pain, nausea, vomiting, abdominal pain.  Patient and family motivated to progress to SNF/rehab.  Was able to update patient's son yesterday over the phone about her goals for placement.  Physical Exam:  Vitals:   10/21/23 0400 10/21/23 0500 10/21/23 0759 10/21/23 0903  BP: (!) 107/52  (!) 105/39 (!) 114/43  Pulse: 81 77 80   Resp: ROLLEN)  22 18 (!) 21   Temp: 98.2 F (36.8 C)  98.7 F (37.1 C)   TempSrc: Oral  Oral   SpO2: 94% 100% 100%   Weight: 118.6 kg     Height:        GENERAL:  Alert, pleasant, disheveled, obese HEENT:  EOMI, nasal cannula CARDIOVASCULAR:  RRR, no murmurs appreciated RESPIRATORY: Poor air movement bilaterally GASTROINTESTINAL:  Soft, nontender,  nondistended EXTREMITIES: BL LE chronic lymphedema NEURO:  No new focal deficits appreciated SKIN:  No rashes noted PSYCH: Pleasant, appropriate   Data Reviewed:  Imaging Studies: CT Head Wo Contrast Result Date: 10/16/2023 CLINICAL DATA:  Mental status change EXAM: CT HEAD WITHOUT CONTRAST TECHNIQUE: Contiguous axial images were obtained from the base of the skull through the vertex without intravenous contrast. RADIATION DOSE REDUCTION: This exam was performed according to the departmental dose-optimization program which includes automated exposure control, adjustment of the mA and/or kV according to patient size and/or use of iterative reconstruction technique. COMPARISON:  06/26/2017 FINDINGS: Brain: No acute territorial infarction, hemorrhage or intracranial mass. Interval mild atrophy. Mild chronic small vessel ischemic changes of the white matter. Interval ventricular enlargement, likely due to atrophy progression Vascular: No hyperdense vessels.  No unexpected calcification Skull: Normal. Negative for fracture or focal lesion. Sinuses/Orbits: No acute finding. Other: None IMPRESSION: 1. No CT evidence for acute intracranial abnormality. 2. Interval mild atrophy and chronic small vessel ischemic changes of the white matter. Interval mild ventricular enlargement, likely due to atrophy progression. Electronically Signed   By: Luke Bun M.D.   On: 10/16/2023 21:41   DG Chest Portable 1 View Result Date: 10/16/2023 CLINICAL DATA:  Shortness of breath EXAM: PORTABLE CHEST 1 VIEW COMPARISON:  Radiograph and CT 09/19/2023 FINDINGS: Similar cardiomegaly. Aortic atherosclerotic calcification. Pulmonary vascular congestion. Interstitial coarsening and hazy airspace opacities in the lower lungs. Question small left pleural effusion. No pneumothorax. IMPRESSION: 1. Findings most compatible with CHF right and pulmonary edema. Electronically Signed   By: Norman Gatlin M.D.   On: 10/16/2023 18:53     There are no new results to review at this time.  Previous records (including but not limited to H&P, progress notes, nursing notes, TOC management) were reviewed in assessment of this patient.  Labs: CBC: Recent Labs  Lab 10/17/23 0317 10/18/23 0305 10/19/23 0242 10/20/23 0309 10/21/23 0306  WBC 7.3 10.0 8.1 8.9 8.9  HGB 10.5* 9.8* 9.5* 9.2* 9.0*  HCT 34.7* 31.8* 31.1* 30.2* 29.9*  MCV 90.6 89.6 90.1 89.6 90.9  PLT 194 189 176 178 156   Basic Metabolic Panel: Recent Labs  Lab 10/17/23 0317 10/18/23 0305 10/19/23 0242 10/20/23 0309 10/21/23 0306  NA 139 138 138 137 136  K 3.5 3.2* 3.5 3.2* 3.3*  CL 99 98 98 97* 99  CO2 32 32 31 32 31  GLUCOSE 99 107* 85 107* 90  BUN 16 18 17 14 14   CREATININE 1.12* 1.16* 1.24* 1.08* 0.97  CALCIUM  9.0 8.5* 8.5* 8.5* 8.8*  MG  --  1.6* 1.6* 1.8 1.9  PHOS  --   --  3.5  --   --    Liver Function Tests: Recent Labs  Lab 10/17/23 0317 10/19/23 0242  AST 27 27  ALT 15 15  ALKPHOS 75 66  BILITOT 1.3* 0.9  PROT 8.3* 7.0  ALBUMIN 2.3* 2.2*   CBG: Recent Labs  Lab 10/19/23 2114 10/20/23 0645  GLUCAP 147* 115*    Scheduled Meds:  acetaZOLAMIDE  ER  500 mg  Oral BID   Chlorhexidine  Gluconate Cloth  6 each Topical Daily   citalopram   20 mg Oral Daily   enoxaparin  (LOVENOX ) injection  60 mg Subcutaneous QHS   feeding supplement  237 mL Oral BID BM   furosemide   60 mg Intravenous BID   hydrocerin   Topical Daily   levothyroxine   200 mcg Oral QAC breakfast   mupirocin  ointment  1 Application Nasal BID   polyethylene glycol  17 g Oral Daily   potassium chloride   40 mEq Oral BID   umeclidinium bromide   1 puff Inhalation Daily   Continuous Infusions: PRN Meds:.bisacodyl , bisacodyl , hydrALAZINE , ipratropium-albuterol , ondansetron  **OR** ondansetron  (ZOFRAN ) IV, sodium phosphate   Family Communication: Husband at bedside  Disposition: Status is: Inpatient Remains inpatient appropriate because: Respiratory  failure     Time spent: 50 minutes  Length of inpatient stay: 5 days  Author: Carliss LELON Canales, DO 10/21/2023 11:35 AM  For on call review [email or web address].

## 2023-10-21 NOTE — Plan of Care (Signed)
   Problem: Education: Goal: Knowledge of General Education information will improve Description Including pain rating scale, medication(s)/side effects and non-pharmacologic comfort measures Outcome: Progressing   Problem: Health Behavior/Discharge Planning: Goal: Ability to manage health-related needs will improve Outcome: Progressing

## 2023-10-21 NOTE — Progress Notes (Signed)
 Initial Nutrition Assessment  DOCUMENTATION CODES:   Morbid obesity  INTERVENTION:  -Change diet to soft r/t difficulty chewing w/ endentulism -Continue Ensure Plus High Protein BID -Add Magic Cup BID -Continue MVI -Discussed importance of adequate kcal, pro for healing, encouraged weight maintenance while healing from acute illness   NUTRITION DIAGNOSIS:   Increased nutrient needs related to acute illness, chronic illness as evidenced by estimated needs.  GOAL:   Patient will meet greater than or equal to 90% of their needs  MONITOR:   Weight trends, PO intake, Supplement acceptance, Labs, Skin  REASON FOR ASSESSMENT:   Malnutrition Screening Tool    ASSESSMENT:   Hx morbid obesity, chronic respiratory failure with hypoxia and hypercarbia, COPD, GERD, OSA, hypothyroidism, obesity hypoventilation syndrome, depression, diastolic CHF, chronic back pain bilateral lymphedema, HTN who was recently admitted to the hospital with acute on chronic respiratory failure and subsequently discharged to skilled facility. Patient was discharged home from the skilled facility 4 days prior to admit. Prested with altered mental status.  Spoke to pt and husband in room. Pt sleeping during majority of conversation. Pt with no n/v/d or swallowing difficulties. Does have noted constipation with stool softener in use. Last BM 8/10. Per husband, pt with difficulty chewing r/t being edentulous. Discussed current diet order, agreeable to trying soft diet texture. Pt with significant documented weight loss of -40.2 kg (25%) x 5 months. Some suspicion of carry over weight on 05/22/23 from 07/04/22. Though, also with significant documented weight loss from June 2025 to current (10/2023) of -36.6 kg x 2 months (24%). Suspect lymphedema and stated purposeful weight loss during that time playing a role in weight changes. Discussed goal of weight maintenance (other than needed fluid losses with edema), goal of  adequate kcal/pro intake for healing, fueling the body, maintaining/increasing function in SNF. Agreeable to trying Magic Cup BID to increase protein intake. Goal to d/c back to SNF. Pt/husband deny additional questions/concerns at this time, will continue to monitor, RDN available prn.   Labs Potassium 3.3 (on diuretics, being repleted prn) Calcium  8.8 Albumin 2.2 H/H 9.0/29.9  Medications  acetaZOLAMIDE  ER  500 mg Oral BID   Chlorhexidine  Gluconate Cloth  6 each Topical Daily   citalopram   20 mg Oral Daily   enoxaparin  (LOVENOX ) injection  60 mg Subcutaneous QHS   feeding supplement  237 mL Oral BID BM   furosemide   40 mg Oral BID   hydrocerin   Topical Daily   levothyroxine   200 mcg Oral QAC breakfast   mupirocin  ointment  1 Application Nasal BID   polyethylene glycol  17 g Oral Daily   potassium chloride   40 mEq Oral BID   umeclidinium bromide   1 puff Inhalation Daily     NUTRITION - FOCUSED PHYSICAL EXAM:  Deferred to f/u r/t mod/severe edema, pt sleepiness.   Diet Order:   Diet Order             Diet Heart Room service appropriate? Yes; Fluid consistency: Thin  Diet effective now                   EDUCATION NEEDS:   Education needs have been addressed  Skin:  Skin Assessment: Reviewed RN Assessment  Last BM:  8/11  Height:   Ht Readings from Last 1 Encounters:  10/16/23 5' 2 (1.575 m)    Weight:   Wt Readings from Last 1 Encounters:  10/21/23 118.6 kg   BMI:  Body mass index is 47.82  kg/m.  Estimated Nutritional Needs:   Kcal:  1700-2100 kcal  Protein:  75-100 g  Fluid:  1.7-2.1 L  Morgan Odonnell, RDN, LDN Registered Dietitian Nutritionist RD Inpatient Contact Info in [place]

## 2023-10-21 NOTE — Progress Notes (Signed)
 Pt states she has changed her mind about DNR and has asked this RN to take off her DNR bracelet. Pt is currently A/O x 4. MD has been made aware.

## 2023-10-22 DIAGNOSIS — J9622 Acute and chronic respiratory failure with hypercapnia: Secondary | ICD-10-CM | POA: Diagnosis not present

## 2023-10-22 DIAGNOSIS — J9621 Acute and chronic respiratory failure with hypoxia: Secondary | ICD-10-CM | POA: Diagnosis not present

## 2023-10-22 LAB — BASIC METABOLIC PANEL WITH GFR
Anion gap: 4 — ABNORMAL LOW (ref 5–15)
BUN: 16 mg/dL (ref 8–23)
CO2: 33 mmol/L — ABNORMAL HIGH (ref 22–32)
Calcium: 9.4 mg/dL (ref 8.9–10.3)
Chloride: 100 mmol/L (ref 98–111)
Creatinine, Ser: 1.03 mg/dL — ABNORMAL HIGH (ref 0.44–1.00)
GFR, Estimated: 58 mL/min — ABNORMAL LOW (ref >60)
Glucose, Bld: 107 mg/dL — ABNORMAL HIGH (ref 70–99)
Potassium: 3.6 mmol/L (ref 3.5–5.1)
Sodium: 137 mmol/L (ref 135–145)

## 2023-10-22 LAB — CBC
HCT: 32.3 % — ABNORMAL LOW (ref 36.0–46.0)
Hemoglobin: 9.8 g/dL — ABNORMAL LOW (ref 12.0–15.0)
MCH: 27.3 pg (ref 26.0–34.0)
MCHC: 30.3 g/dL (ref 30.0–36.0)
MCV: 90 fL (ref 80.0–100.0)
Platelets: 172 K/uL (ref 150–400)
RBC: 3.59 MIL/uL — ABNORMAL LOW (ref 3.87–5.11)
RDW: 15.9 % — ABNORMAL HIGH (ref 11.5–15.5)
WBC: 8.8 K/uL (ref 4.0–10.5)
nRBC: 0 % (ref 0.0–0.2)

## 2023-10-22 LAB — MAGNESIUM: Magnesium: 1.9 mg/dL (ref 1.7–2.4)

## 2023-10-22 MED ORDER — ACETAMINOPHEN 325 MG PO TABS
650.0000 mg | ORAL_TABLET | Freq: Four times a day (QID) | ORAL | Status: DC | PRN
  Administered 2023-10-23 (×2): 650 mg via ORAL
  Filled 2023-10-22 (×2): qty 2

## 2023-10-22 MED ORDER — POTASSIUM CHLORIDE 20 MEQ PO PACK
40.0000 meq | PACK | Freq: Every day | ORAL | Status: DC
  Administered 2023-10-22 – 2023-10-23 (×3): 40 meq via ORAL
  Filled 2023-10-22 (×2): qty 2

## 2023-10-22 MED ORDER — SPIRONOLACTONE 25 MG PO TABS
25.0000 mg | ORAL_TABLET | Freq: Every day | ORAL | Status: DC
  Administered 2023-10-23: 25 mg via ORAL
  Filled 2023-10-22: qty 1

## 2023-10-22 NOTE — TOC Progression Note (Signed)
 Transition of Care [facility]) - Progression Note    Patient Details  Name: Morgan Odonnell MRN: [id number] Date of Birth: [birth year]  Transition [facility]) CM/SW Contact  Waddell Barnie Rama, RN Phone Number: 10/22/2023, 2:16 PM  Clinical Narrative:    This NCM was informed will need to order bipap or NIV for home. NCM offered choice, spouse at bedside states he is ok with [employer].  NCM made referral to Jermaine with [employer] for bipap/NIV.  Expected Discharge Plan: Skilled Nursing Facility Barriers to Discharge: Continued Medical Work up, English as a second language teacher               Expected Discharge Plan and Services In-house Referral: Clinical Social Work     Living arrangements for the past 2 months: Single Family Home                                       Social Drivers of Health (SDOH) Interventions SDOH Screenings   Food Insecurity: No Food Insecurity (10/16/2023)  Housing: Low Risk  (10/16/2023)  Transportation Needs: No Transportation Needs (10/16/2023)  Utilities: Not At Risk (10/16/2023)  Depression (PHQ2-9): Medium Risk (01/13/2019)  Social Connections: Moderately Integrated (10/16/2023)  Tobacco Use: Medium Risk (10/16/2023)    Readmission Risk Interventions    09/19/2023    1:52 PM 08/17/2023    9:31 AM  Readmission Risk Prevention Plan  Transportation Screening Complete Complete  PCP or Specialist Appt within 5-7 Days  Complete  PCP or Specialist Appt within 3-5 Days Complete   Home Care Screening  Complete  Medication Review (RN CM)  Complete  HRI or Home Care Consult Complete   Social Work Consult for Recovery Care Planning/Counseling Complete   Palliative Care Screening Complete   Medication Review Oceanographer) Complete

## 2023-10-22 NOTE — TOC Progression Note (Addendum)
 Transition of Care [facility]) - Progression Note    Patient Details  Name: Morgan Odonnell MRN: [id number] Date of Birth: [birth year]  Transition of Care [facility]) CM/SW Contact  Luise JAYSON Pan, [place] Phone Number: 10/22/2023, 8:35 AM  Clinical Narrative:   Shara still pending at this time.   9:30 AM Insurance is offering a peer to peer due today by 1:30PM, call [phone] opt 5; Will need to say pts name, DOB, and member ID. CSW informed MD via secure chat.    10:57 AM CSW notified MD of peer to peer via amion/pager.   1:39 PM MD completed peer to peer. Insurance denied at this time. Call [phone]; fax [phone]. CSW spoke with [name] about insurance denial and patiens right to appeal insurance denial for SNF. [name] stated he is open to patient going home but has concerns about how she will get a cpap. Lawrence asked CSW to inquire about home cpap with MD and RNCM. Lawrence stated if patient cannot get home cpap at discharge, he would like to appeal insurance denial.   CSW will continue to follow.    Expected Discharge Plan: Skilled Nursing Facility Barriers to Discharge: Continued Medical Work up, English as a second language teacher               Expected Discharge Plan and Services In-house Referral: Clinical Social Work     Living arrangements for the past 2 months: Single Family Home                                       Social Drivers of Health (SDOH) Interventions SDOH Screenings   Food Insecurity: No Food Insecurity (10/16/2023)  Housing: Low Risk  (10/16/2023)  Transportation Needs: No Transportation Needs (10/16/2023)  Utilities: Not At Risk (10/16/2023)  Depression (PHQ2-9): Medium Risk (01/13/2019)  Social Connections: Moderately Integrated (10/16/2023)  Tobacco Use: Medium Risk (10/16/2023)    Readmission Risk Interventions    09/19/2023    1:52 PM 08/17/2023    9:31 AM  Readmission Risk Prevention Plan  Transportation Screening Complete Complete  PCP or  Specialist Appt within 5-7 Days  Complete  PCP or Specialist Appt within 3-5 Days Complete   Home Care Screening  Complete  Medication Review (RN CM)  Complete  HRI or Home Care Consult Complete   Social Work Consult for Recovery Care Planning/Counseling Complete   Palliative Care Screening Complete   Medication Review Oceanographer) Complete

## 2023-10-22 NOTE — Plan of Care (Signed)

## 2023-10-22 NOTE — Progress Notes (Signed)
 OT Cancellation Note  Patient Details Name: Morgan Odonnell MRN: [id number] DOB: 1950-09-12   Cancelled Treatment:    Reason Eval/Treat Not Completed: Medical issues which prohibited therapy. OT entered in attempt to complete tx session. BP recorded at beginning of session 110/37(58) . Pt completed ankle pumps and glute muscle contractions from bed in attempt to raise BP. BP recorded again 3 minutes later, 82/50 (57). OT changed arms and BP reading at 96/50 (60). Pt denies any symptoms, but will hold therapy d/t soft BP. RN notified. OT will continue to follow acutely.    Haide Klinker C, OT  Acute Rehabilitation Services Office [phone] Secure chat preferred   Adrianne GORMAN Savers 10/22/2023, 3:56 PM

## 2023-10-22 NOTE — Progress Notes (Signed)
 PROGRESS NOTE    Morgan Odonnell  FMW:[id number] DOB: 1950/10/12 DOA: 10/16/2023 PCP: Pcp, No   73 y.o. female with medical history significant of morbid obesity, chronic respiratory failure with hypoxia and hypercarbia, chronic diastolic CHF, COPD, GERD, obstructive sleep apnea, hypothyroidism, obesity hypoventilation syndrome, depression, diastolic CHF, chronic back pain bilateral lymphedema essential hypertension among other things who was recently admitted to the hospital with acute on chronic respiratory failure and subsequently discharged to skilled facility.  - Discharged home from SNF 4 days prior to admission and brought to the ED by spouse for lethargy,, found to be severely hypercarbic, placed on BiPAP - Improved with nocturnal CPAP/BiPAP and diuresis - SNF recommended, significantly debilitated  Subjective: -Feels fair, no events overnight, she is unsure if she used CPAP last night  Assessment and Plan:  Acute metabolic encephalopathy - From hypercarbic respiratory failure, untreated OSA/OHS after discharge from SNF 4 days prior to admission -Improved, change nocturnal CPAP to BiPAP, she has not had a sleep study   Acute hypoxic and hypercapnic respiratory failure Severe OSA/OHS - Initially had difficulty tolerating BiPAP, continuous high flow was used, eventually weaned down to 3/4 L O2, second hospitalization with respiratory failure -Add nocturnal BiPAP, TOC consult   Acute exacerbation of HFpEF Moderate pulmonary hypertension, moderate RV failure -Last echo 6/25 with EF 50-55%, low quality study, indeterminate diastolic function, moderately reduced RV, moderately elevated PASP  - Diuresed with IV Lasix  initially, now changed to p.o. - Add Aldactone  - Poor candidate for SGLT2i   Hypokalemia - Pleated   Acute kidney injury (POA) - Now improved and stable   Lymphedema - Diuresis as above.  Now has lymphedema wraps.   Morbid obesity, physical debility, largely  bedbound - Evaluated by PT/OT.  Recommending SNF.  Working closely with TOC on disposition.   DVT prophylaxis: lovenox  Code Status: Full Code, changed her CODE STATUS overnight, rediscussed this with spouse and patient Family Communication: Spouse at bedside Disposition Plan: To be determined  Consultants:    Procedures:   Antimicrobials:    Objective: Vitals:   10/21/23 2327 10/22/23 0000 10/22/23 0400 10/22/23 0736  BP:  (!) 110/42 (!) 101/38   Pulse: 79 82 88   Resp: 18 (!) 23 19   Temp:  98.2 F (36.8 C) 98.2 F (36.8 C)   TempSrc:  Oral Oral   SpO2: 94% 97% 96% 99%  Weight:   113.4 kg   Height:        Intake/Output Summary (Last 24 hours) at 10/22/2023 1254 Last data filed at 10/22/2023 1244 Gross per 24 hour  Intake 720 ml  Output 1500 ml  Net -780 ml   Filed Weights   10/19/23 0458 10/21/23 0400 10/22/23 0400  Weight: 118.5 kg 118.6 kg 113.4 kg    Examination:  General exam: Obese chronically ill female laying in bed, AO x 2, no distress HEENT: Neck obese unable to assess JVD CVS: S1-S2, regular rhythm Lungs: Decreased breath sounds bases Abdomen: Soft, nontender, bowel sounds present Extremities: Trace edema Skin: No rashes Psychiatry:  Mood & affect appropriate.     Data Reviewed:   CBC: Recent Labs  Lab 10/18/23 0305 10/19/23 0242 10/20/23 0309 10/21/23 0306 10/22/23 0225  WBC 10.0 8.1 8.9 8.9 8.8  HGB 9.8* 9.5* 9.2* 9.0* 9.8*  HCT 31.8* 31.1* 30.2* 29.9* 32.3*  MCV 89.6 90.1 89.6 90.9 90.0  PLT 189 176 178 156 172   Basic Metabolic Panel: Recent Labs  Lab 10/18/23 0305 10/19/23 0242  10/20/23 0309 10/21/23 0306 10/22/23 0225  NA 138 138 137 136 137  K 3.2* 3.5 3.2* 3.3* 3.6  CL 98 98 97* 99 100  CO2 32 31 32 31 33*  GLUCOSE 107* 85 107* 90 107*  BUN 18 17 14 14 16   CREATININE 1.16* 1.24* 1.08* 0.97 1.03*  CALCIUM  8.5* 8.5* 8.5* 8.8* 9.4  MG 1.6* 1.6* 1.8 1.9 1.9  PHOS  --  3.5  --   --   --    GFR: Estimated  Creatinine Clearance: 59.6 mL/min (A) (by C-G formula based on SCr of 1.03 mg/dL (H)). Liver Function Tests: Recent Labs  Lab 10/17/23 0317 10/19/23 0242  AST 27 27  ALT 15 15  ALKPHOS 75 66  BILITOT 1.3* 0.9  PROT 8.3* 7.0  ALBUMIN 2.3* 2.2*   No results for input(s): LIPASE, AMYLASE in the last 168 hours. Recent Labs  Lab 10/16/23 1847  AMMONIA 19   Coagulation Profile: No results for input(s): INR, PROTIME in the last 168 hours. Cardiac Enzymes: No results for input(s): CKTOTAL, CKMB, CKMBINDEX, TROPONINI in the last 168 hours. BNP (last 3 results) No results for input(s): PROBNP in the last 8760 hours. HbA1C: No results for input(s): HGBA1C in the last 72 hours. CBG: Recent Labs  Lab 10/19/23 2114 10/20/23 0645  GLUCAP 147* 115*   Lipid Profile: No results for input(s): CHOL, HDL, LDLCALC, TRIG, CHOLHDL, LDLDIRECT in the last 72 hours. Thyroid  Function Tests: No results for input(s): TSH, T4TOTAL, FREET4, T3FREE, THYROIDAB in the last 72 hours. Anemia Panel: No results for input(s): VITAMINB12, FOLATE, FERRITIN, TIBC, IRON, RETICCTPCT in the last 72 hours. Urine analysis:    Component Value Date/Time   COLORURINE YELLOW 08/27/2023 1933   APPEARANCEUR CLEAR 08/27/2023 1933   LABSPEC 1.015 08/27/2023 1933   PHURINE 6.0 08/27/2023 1933   GLUCOSEU NEGATIVE 08/27/2023 1933   HGBUR SMALL (A) 08/27/2023 1933   BILIRUBINUR NEGATIVE 08/27/2023 1933   KETONESUR NEGATIVE 08/27/2023 1933   PROTEINUR NEGATIVE 08/27/2023 1933   UROBILINOGEN 0.2 04/01/2007 1526   NITRITE NEGATIVE 08/27/2023 1933   LEUKOCYTESUR NEGATIVE 08/27/2023 1933   Sepsis Labs: @LABRCNTIP (procalcitonin:4,lacticidven:4)  ) Recent Results (from the past 240 hours)  MRSA Next Gen by PCR, Nasal     Status: Abnormal   Collection Time: 10/17/23  6:04 PM   Specimen: Nasal Mucosa; Nasal Swab  Result Value Ref Range Status   MRSA by PCR Next Gen  DETECTED (A) NOT DETECTED Final    Comment: (NOTE) The GeneXpert MRSA Assay (FDA approved for NASAL specimens only), is one component of a comprehensive MRSA colonization surveillance program. It is not intended to diagnose MRSA infection nor to guide or monitor treatment for MRSA infections. Test performance is not FDA approved in patients less than 13 years old. Performed at [facility], [address] [place], [place] [address]      Radiology Studies: No results found.   Scheduled Meds:  acetaZOLAMIDE  ER  500 mg Oral BID   Chlorhexidine  Gluconate Cloth  6 each Topical Daily   citalopram   20 mg Oral Daily   enoxaparin  (LOVENOX ) injection  60 mg Subcutaneous QHS   feeding supplement  237 mL Oral BID BM   furosemide   40 mg Oral BID   hydrocerin   Topical Daily   levothyroxine   200 mcg Oral QAC breakfast   multivitamin with minerals  1 tablet Oral Daily   mupirocin  ointment  1 Application Nasal BID   polyethylene glycol  17 g Oral Daily   potassium chloride   40 mEq Oral Daily   umeclidinium bromide   1 puff Inhalation Daily   Continuous Infusions:   LOS: 6 days    Time spent:    Sigurd Pac, MD [facility]   10/22/2023, 12:54 PM

## 2023-10-22 NOTE — Plan of Care (Signed)
  Problem: Clinical Measurements: Goal: Diagnostic test results will improve Outcome: Progressing Goal: Respiratory complications will improve Outcome: Progressing   Problem: Activity: Goal: Risk for activity intolerance will decrease Outcome: Progressing   

## 2023-10-23 DIAGNOSIS — J9622 Acute and chronic respiratory failure with hypercapnia: Secondary | ICD-10-CM | POA: Diagnosis not present

## 2023-10-23 DIAGNOSIS — J9621 Acute and chronic respiratory failure with hypoxia: Secondary | ICD-10-CM | POA: Diagnosis not present

## 2023-10-23 LAB — BASIC METABOLIC PANEL WITH GFR
Anion gap: 6 (ref 5–15)
BUN: 13 mg/dL (ref 8–23)
CO2: 31 mmol/L (ref 22–32)
Calcium: 9.5 mg/dL (ref 8.9–10.3)
Chloride: 97 mmol/L — ABNORMAL LOW (ref 98–111)
Creatinine, Ser: 1 mg/dL (ref 0.44–1.00)
GFR, Estimated: >60 mL/min (ref >60)
Glucose, Bld: 89 mg/dL (ref 70–99)
Potassium: 3.9 mmol/L (ref 3.5–5.1)
Sodium: 134 mmol/L — ABNORMAL LOW (ref 135–145)

## 2023-10-23 NOTE — Progress Notes (Signed)
 PROGRESS NOTE    SHEALYN SEAN  FMW:[id number] DOB: [birth year] DOA: 10/16/2023 PCP: Pcp, No   73 y.o. female with morbid obesity, chronic respiratory failure with hypoxia and hypercarbia, chronic diastolic CHF, COPD, GERD, obstructive sleep apnea, hypothyroidism, obesity hypoventilation syndrome, depression, diastolic CHF, chronic back pain bilateral lymphedema essential hypertension among other things who was recently admitted to the hospital with acute on chronic respiratory failure and subsequently discharged to skilled facility.  - Discharged home from SNF 4 days prior to admission and brought to the ED by spouse for lethargy,, found to be severely hypercarbic, placed on BiPAP - Improved with nocturnal CPAP/BiPAP and diuresis - SNF recommended, significantly debilitated> insurance declined SNF  Subjective: - Feels okay, no events overnight, did not use CPAP last night  Assessment and Plan:  Acute metabolic encephalopathy - From hypercarbic respiratory failure, untreated OSA/OHS after discharge from SNF 4 days prior to admission -Improved, changed nocturnal CPAP to BiPAP, she has not had a sleep study -Courage compliance again, discussed with spouse as well   Acute hypoxic and hypercapnic respiratory failure Severe OSA/OHS - Initially had difficulty tolerating BiPAP, continuous high flow was used, eventually weaned down to 3/4 L O2, second hospitalization with respiratory failure - Ordered nocturnal BiPAP, TOC consult   Acute exacerbation of HFpEF Moderate pulmonary hypertension, moderate RV failure -Last echo 6/25 with EF 50-55%, low quality study, indeterminate diastolic function, moderately reduced RV, moderately elevated PASP  - Diuresed with IV Lasix  initially, now changed to p.o. - Started on Aldactone  - Poor candidate for SGLT2i   Hypokalemia - Pleated   Acute kidney injury (POA) - Now improved and stable   Lymphedema - Diuresis as above.  Now has lymphedema  wraps.   Morbid obesity, physical debility, largely bedbound - Evaluated by PT/OT.  Recommending SNF.  Working closely with TOC on disposition.   DVT prophylaxis: lovenox  Code Status: Full Code, changed her CODE STATUS overnight, rediscussed this with spouse and patient Family Communication: Spouse at bedside Disposition Plan: Home tomorrow once equipment delivered  Consultants:    Procedures:   Antimicrobials:    Objective: Vitals:   10/23/23 0412 10/23/23 0742 10/23/23 0822 10/23/23 1205  BP: (!) 128/53   (!) 126/48  Pulse: 89  82 84  Resp: 19  18 19   Temp: 99 F (37.2 C)  98.7 F (37.1 C) 98.3 F (36.8 C)  TempSrc: Oral  Oral Oral  SpO2: 95% 92% 100% 99%  Weight: 111.8 kg     Height:        Intake/Output Summary (Last 24 hours) at 10/23/2023 1211 Last data filed at 10/23/2023 1206 Gross per 24 hour  Intake 1077 ml  Output 1850 ml  Net -773 ml   Filed Weights   10/21/23 0400 10/22/23 0400 10/23/23 0412  Weight: 118.6 kg 113.4 kg 111.8 kg    Examination:  General exam: Obese chronically ill female laying in bed, AO x 2, no distress HEENT: Neck obese unable to assess JVD CVS: S1-S2, regular rhythm Lungs: Decreased breath sounds bases Abdomen: Soft, nontender, bowel sounds present Extremities: Trace edema Skin: No rashes Psychiatry:  Mood & affect appropriate.     Data Reviewed:   CBC: Recent Labs  Lab 10/18/23 0305 10/19/23 0242 10/20/23 0309 10/21/23 0306 10/22/23 0225  WBC 10.0 8.1 8.9 8.9 8.8  HGB 9.8* 9.5* 9.2* 9.0* 9.8*  HCT 31.8* 31.1* 30.2* 29.9* 32.3*  MCV 89.6 90.1 89.6 90.9 90.0  PLT 189 176 178 156 172  Basic Metabolic Panel: Recent Labs  Lab 10/18/23 0305 10/19/23 0242 10/20/23 0309 10/21/23 0306 10/22/23 0225  NA 138 138 137 136 137  K 3.2* 3.5 3.2* 3.3* 3.6  CL 98 98 97* 99 100  CO2 32 31 32 31 33*  GLUCOSE 107* 85 107* 90 107*  BUN 18 17 14 14 16   CREATININE 1.16* 1.24* 1.08* 0.97 1.03*  CALCIUM  8.5* 8.5* 8.5*  8.8* 9.4  MG 1.6* 1.6* 1.8 1.9 1.9  PHOS  --  3.5  --   --   --    GFR: Estimated Creatinine Clearance: 59.2 mL/min (A) (by C-G formula based on SCr of 1.03 mg/dL (H)). Liver Function Tests: Recent Labs  Lab 10/17/23 0317 10/19/23 0242  AST 27 27  ALT 15 15  ALKPHOS 75 66  BILITOT 1.3* 0.9  PROT 8.3* 7.0  ALBUMIN 2.3* 2.2*   No results for input(s): LIPASE, AMYLASE in the last 168 hours. Recent Labs  Lab 10/16/23 1847  AMMONIA 19   Coagulation Profile: No results for input(s): INR, PROTIME in the last 168 hours. Cardiac Enzymes: No results for input(s): CKTOTAL, CKMB, CKMBINDEX, TROPONINI in the last 168 hours. BNP (last 3 results) No results for input(s): PROBNP in the last 8760 hours. HbA1C: No results for input(s): HGBA1C in the last 72 hours. CBG: Recent Labs  Lab 10/19/23 2114 10/20/23 0645  GLUCAP 147* 115*   Lipid Profile: No results for input(s): CHOL, HDL, LDLCALC, TRIG, CHOLHDL, LDLDIRECT in the last 72 hours. Thyroid  Function Tests: No results for input(s): TSH, T4TOTAL, FREET4, T3FREE, THYROIDAB in the last 72 hours. Anemia Panel: No results for input(s): VITAMINB12, FOLATE, FERRITIN, TIBC, IRON, RETICCTPCT in the last 72 hours. Urine analysis:    Component Value Date/Time   COLORURINE YELLOW 08/27/2023 1933   APPEARANCEUR CLEAR 08/27/2023 1933   LABSPEC 1.015 08/27/2023 1933   PHURINE 6.0 08/27/2023 1933   GLUCOSEU NEGATIVE 08/27/2023 1933   HGBUR SMALL (A) 08/27/2023 1933   BILIRUBINUR NEGATIVE 08/27/2023 1933   KETONESUR NEGATIVE 08/27/2023 1933   PROTEINUR NEGATIVE 08/27/2023 1933   UROBILINOGEN 0.2 04/01/2007 1526   NITRITE NEGATIVE 08/27/2023 1933   LEUKOCYTESUR NEGATIVE 08/27/2023 1933   Sepsis Labs: @LABRCNTIP (procalcitonin:4,lacticidven:4)  ) Recent Results (from the past 240 hours)  MRSA Next Gen by PCR, Nasal     Status: Abnormal   Collection Time: 10/17/23  6:04 PM    Specimen: Nasal Mucosa; Nasal Swab  Result Value Ref Range Status   MRSA by PCR Next Gen DETECTED (A) NOT DETECTED Final    Comment: (NOTE) The GeneXpert MRSA Assay (FDA approved for NASAL specimens only), is one component of a comprehensive MRSA colonization surveillance program. It is not intended to diagnose MRSA infection nor to guide or monitor treatment for MRSA infections. Test performance is not FDA approved in patients less than 70 years old. Performed at [facility], [address], [place], [place] [address]      Radiology Studies: No results found.   Scheduled Meds:  citalopram   20 mg Oral Daily   enoxaparin  (LOVENOX ) injection  60 mg Subcutaneous QHS   feeding supplement  237 mL Oral BID BM   furosemide   40 mg Oral BID   hydrocerin   Topical Daily   levothyroxine   200 mcg Oral QAC breakfast   multivitamin with minerals  1 tablet Oral Daily   polyethylene glycol  17 g Oral Daily   potassium chloride   40 mEq Oral Daily   spironolactone   25 mg Oral Daily   umeclidinium bromide   1 puff Inhalation Daily   Continuous Infusions:   LOS: 7 days    Time spent:    Sigurd Pac, MD [facility]   10/23/2023, 12:11 PM

## 2023-10-23 NOTE — Plan of Care (Signed)
   Problem: Health Behavior/Discharge Planning: Goal: Ability to manage health-related needs will improve Outcome: Not Progressing

## 2023-10-23 NOTE — TOC Progression Note (Addendum)
 Transition of Care [facility]) - Progression Note    Patient Details  Name: Morgan Odonnell MRN: [id number] Date of Birth: 1950/07/10  [facility] CM/SW Contact  Waddell Barnie Rama, RN Phone Number: 10/23/2023, 11:12 AM  Clinical Narrative:    [name] has sent orders over for MD to sign for bipap, NCM offered choice for HHRN, HHPT, HHOT.  Per spouse at the bedside states they have no preference.  NCM made referral to [facility] with [facility], he states he will check availability and call this NCM back.  Per Darleene with [facility] they do not have the staff.  NCM made referral to [facility] with [facility], she is able to take referral for HHPT, HHOT to start with and then pick up with [facility].  The spouse states he was doing the dressing change when patient was at home and will continue to do so.  HHRN will be addded in and spouse will also be doing dressing changes the days the [facility] is not there.  Will need orders for HHPT, HHOT for now.   Plan for dc on 8/15- [facility] Respiratory therapist is going over instructions for the bipap in the room.  Patient will need ambulance transport.   Expected Discharge Plan: Skilled Nursing Facility Barriers to Discharge: Continued Medical Work up, English as a second language teacher               Expected Discharge Plan and Services In-house Referral: Clinical Social Work     Living arrangements for the past 2 months: Single Family Home                                       Social Drivers of Health (SDOH) Interventions SDOH Screenings   Food Insecurity: No Food Insecurity (10/16/2023)  Housing: Low Risk  (10/16/2023)  Transportation Needs: No Transportation Needs (10/16/2023)  Utilities: Not At Risk (10/16/2023)  Depression (PHQ2-9): Medium Risk (01/13/2019)  Social Connections: Moderately Integrated (10/16/2023)  Tobacco Use: Medium Risk (10/16/2023)    Readmission Risk Interventions    09/19/2023    1:52 PM 08/17/2023    9:31 AM  Readmission Risk  Prevention Plan  Transportation Screening Complete Complete  PCP or Specialist Appt within 5-7 Days  Complete  PCP or Specialist Appt within 3-5 Days Complete   Home Care Screening  Complete  Medication Review (RN CM)  Complete  HRI or Home Care Consult Complete   Social Work Consult for Recovery Care Planning/Counseling Complete   Palliative Care Screening Complete   Medication Review Oceanographer) Complete

## 2023-10-23 NOTE — Progress Notes (Signed)
 PT Cancellation Note  Patient Details Name: Morgan Odonnell MRN: [id number] DOB: 29-Nov-1950   Cancelled Treatment:    Reason Eval/Treat Not Completed: (P) Other (comment) (pt and family being educated by staff on sleep mask use for home.) Will continue efforts per PT plan of care as schedule permits.   Eduarda Scrivens M Melton Walls 10/23/2023, 5:00 PM

## 2023-10-23 NOTE — Progress Notes (Signed)
 Due to [name] diagnosis of Acute on Chronic respiratory failure secondary to COPD , Home Medical Ventilation is needed to assist with normalizing carbon dioxide & oxygen  levels and to reduce the risk of repeat, acute episodes of respiratory failure resulting in longer inpatient hospital stays with more acute levels of care, such as ICU and mechanical ventilation. This patient would also benefit from mouthpiece ventilation for prn daytime use. She has been hospitalized for Chronic hypercarbia respiratory failure and COPD . Patient placed on Bilevel BiPAP therapy with settings at 400 tidal volume, IPAP=15, EPAP=5, and rate of 20.  ABG on 10/16/23 after Bilevel BiPAP resulted in continued hypercapnia CO2 of 62.0 > Bilevel BiPAP therapy failed and patient requires E0466 NIV with battery back in the event of a power outage and advanced alarms to advise of a disconnect or low respiratory rate not supported by Bilevel BiPAP device.  Interruption or failure to provide Home Medical Ventilation would quickly lead to exacerbation of the patient's condition, lead to more hospitalizations and likely harm the patient or possibly death. Continued use of the NIV is preferred. Patient is able to maintain airway and clear secretions

## 2023-10-24 ENCOUNTER — Other Ambulatory Visit (HOSPITAL_COMMUNITY): Payer: Self-pay

## 2023-10-24 DIAGNOSIS — J9622 Acute and chronic respiratory failure with hypercapnia: Secondary | ICD-10-CM | POA: Diagnosis not present

## 2023-10-24 DIAGNOSIS — J9621 Acute and chronic respiratory failure with hypoxia: Secondary | ICD-10-CM | POA: Diagnosis not present

## 2023-10-24 LAB — BASIC METABOLIC PANEL WITH GFR
Anion gap: 6 (ref 5–15)
BUN: 15 mg/dL (ref 8–23)
CO2: 34 mmol/L — ABNORMAL HIGH (ref 22–32)
Calcium: 9.7 mg/dL (ref 8.9–10.3)
Chloride: 96 mmol/L — ABNORMAL LOW (ref 98–111)
Creatinine, Ser: 0.99 mg/dL (ref 0.44–1.00)
GFR, Estimated: >60 mL/min (ref >60)
Glucose, Bld: 106 mg/dL — ABNORMAL HIGH (ref 70–99)
Potassium: 3.5 mmol/L (ref 3.5–5.1)
Sodium: 136 mmol/L (ref 135–145)

## 2023-10-24 MED ORDER — SPIRONOLACTONE 25 MG PO TABS
25.0000 mg | ORAL_TABLET | Freq: Every day | ORAL | 0 refills | Status: DC
Start: 2023-10-24 — End: 2023-10-24
  Filled 2023-10-24: qty 30, 30d supply, fill #0

## 2023-10-24 MED ORDER — FUROSEMIDE 40 MG PO TABS
40.0000 mg | ORAL_TABLET | Freq: Two times a day (BID) | ORAL | 0 refills | Status: DC
  Filled 2023-10-24 (×2): qty 60, 30d supply, fill #0

## 2023-10-24 MED ORDER — SPIRONOLACTONE 25 MG PO TABS: 25.0000 mg | ORAL_TABLET | Freq: Every day | ORAL | 0 refills | Status: AC

## 2023-10-24 MED ORDER — POTASSIUM CHLORIDE 20 MEQ PO PACK
20.0000 meq | PACK | Freq: Two times a day (BID) | ORAL | 1 refills | Status: AC
  Filled 2023-10-24: qty 60, 30d supply, fill #0

## 2023-10-24 NOTE — Care Management Important Message (Signed)
 Important Message  Patient Details  Name: Morgan Odonnell MRN: [id number] Date of Birth: 02-Oct-1950   Important Message Given:  Yes - Medicare IM     [name] 10/24/2023, 10:44 AM

## 2023-10-24 NOTE — Discharge Summary (Signed)
 Physician Discharge Summary  Morgan Odonnell FMW:[id number] DOB: 1950/12/28 DOA: 10/16/2023  PCP: Pcp, No  Admit date: 10/16/2023 Discharge date: 10/24/2023  Time spent: 45 minutes  Recommendations for Outpatient Follow-up:  PCP in 1 week Outpatient palliative care referral sent High risk of quick readmission if she continues to be noncompliant with BiPAP and meds   Discharge Diagnoses:  Acute metabolic encephalopathy   Acute on chronic respiratory failure with hypoxia and hypercapnia (HCC) Acute on chronic diastolic CHF Severe OSA/OHS Pulmonary hypertension RV failure Noncompliance with BiPAP   COPD (chronic obstructive pulmonary disease)Gold C   Hypertension   Morbid obesity with BMI of 50.0-59.9, adult (HCC)   OSA (obstructive sleep apnea)   Obesity hypoventilation syndrome (HCC)   Acute on chronic heart failure with preserved ejection fraction (HFpEF) (HCC)   Acute metabolic encephalopathy   Hypokalemia DNR  Discharge Condition: stable  Diet recommendation: Low sodium, heart healthy  Filed Weights   10/22/23 0400 10/23/23 0412 10/24/23 0628  Weight: 113.4 kg 111.8 kg 110.3 kg    History of present illness:  73 y.o. female with morbid obesity, chronic respiratory failure with hypoxia and hypercarbia, chronic diastolic CHF, COPD, GERD, obstructive sleep apnea, hypothyroidism, obesity hypoventilation syndrome, depression, diastolic CHF, chronic back pain bilateral lymphedema essential hypertension among other things who was recently admitted to the hospital with acute on chronic respiratory failure and subsequently discharged to skilled facility.  - Discharged home from SNF 4 days prior to admission and brought to the ED by spouse for lethargy,, found to be severely hypercarbic, placed on BiPAP - Improved with nocturnal CPAP/BiPAP and diuresis - SNF recommended, significantly debilitated> insurance declined SNF  Hospital Course:  Acute metabolic encephalopathy - From  hypercarbic respiratory failure, untreated OSA/OHS after discharge from SNF 4 days prior to admission -Improved, changed nocturnal CPAP to BiPAP, she has not had a sleep study -Courage compliance again, discussed with spouse as well   Acute hypoxic and hypercapnic respiratory failure Severe OSA/OHS - Initially had difficulty tolerating BiPAP, continuous high flow was used, eventually weaned down to 3/4 L O2, second hospitalization with respiratory failure - Ordered nocturnal BiPAP, TOC consult   Acute exacerbation of HFpEF Moderate pulmonary hypertension, moderate RV failure -Last echo 6/25 with EF 50-55%, low quality study, indeterminate diastolic function, moderately reduced RV, moderately elevated PASP  - Diuresed with IV Lasix  initially, now changed to p.o. - Started on Aldactone  - Poor candidate for SGLT2i   Hypokalemia - Pleated   Acute kidney injury (POA) - Now improved and stable   Lymphedema - Diuresis as above.  Now has lymphedema wraps.   Morbid obesity, physical debility, largely bedbound - Evaluated by PT/OT.  Recommending SNF.  Working closely with TOC on disposition.  Discharge Exam: Vitals:   10/24/23 0406 10/24/23 0729  BP:  (!) 109/47  Pulse: 83 88  Resp: 19 18  Temp:  98.4 F (36.9 C)  SpO2: 92% 96%   General exam: Obese chronically ill female laying in bed, AO x 2, no distress HEENT: Neck obese unable to assess JVD CVS: S1-S2, regular rhythm Lungs: Decreased breath sounds bases Abdomen: Soft, nontender, bowel sounds present Extremities: Trace edema Skin: No rashes Psychiatry:  Mood & affect appropriate  Discharge Instructions   Discharge Instructions     Diet - low sodium heart healthy   Complete by: As directed    Diet Carb Modified   Complete by: As directed    Discharge instructions   Complete by: As directed  VERY IMPORTANT TO USE BIPAP EVERY NIGHT   Discharge wound care:   Complete by: As directed    Clean with soap and water ,  apply silver hydrofiber to fit wound beds, cover with ABD pads and secure with kerlex, change very other day   Increase activity slowly   Complete by: As directed       Allergies as of 10/24/2023       Reactions   Gabapentin Other (See Comments)   slept for 2 days   Naloxone  Other (See Comments)   Allergy not listed on MAR    Novocain [procaine] Other (See Comments)   intolerance        Medication List     STOP taking these medications    baclofen  10 MG tablet Commonly known as: LIORESAL    pregabalin  75 MG capsule Commonly known as: LYRICA    propranolol  10 MG tablet Commonly known as: INDERAL    urea  15 g Pack oral packet Commonly known as: URE-NA       TAKE these medications    AeroChamber Z-Stat Plus Chambr Misc Use as directed   albuterol  108 (90 Base) MCG/ACT inhaler Commonly known as: VENTOLIN  HFA Inhale 2 puffs into the lungs every 6 (six) hours as needed for wheezing.   aspirin  EC 81 MG tablet Take 1 tablet (81 mg total) by mouth daily.   budesonide -formoterol  160-4.5 MCG/ACT inhaler Commonly known as: Symbicort  INHALE TWO puffs into lung TWICE DAILY   cholecalciferol  25 MCG (1000 UNIT) tablet Commonly known as: VITAMIN D3 Take 5,000 Units by mouth daily.   citalopram  20 MG tablet Commonly known as: CELEXA  Take 1 tablet (20 mg total) by mouth daily.   diclofenac  sodium 1 % Gel Commonly known as: VOLTAREN  Apply 1 application topically 4 (four) times daily. Applied to knees, shoulders, wrists, and ankles   furosemide  40 MG tablet Commonly known as: LASIX  Take 1 tablet (40 mg total) by mouth 2 (two) times daily. What changed:  how much to take how to take this when to take this additional instructions   ipratropium-albuterol  0.5-2.5 (3) MG/3ML Soln Commonly known as: DUONEB Take 3 mLs by nebulization every 6 (six) hours as needed (wheezing with dyspnea).   levothyroxine  200 MCG tablet Commonly known as: SYNTHROID  Take 1 tablet (200  mcg total) by mouth daily before breakfast.   Movantik  25 MG Tabs tablet Generic drug: naloxegol  oxalate Take 25 mg by mouth daily.   polyethylene glycol 17 g packet Commonly known as: MIRALAX  / GLYCOLAX  Take 17 g by mouth daily as needed for mild constipation.   potassium chloride  20 MEQ packet Commonly known as: KLOR-CON  Take 20 mEq by mouth 2 (two) times daily.   Relistor  150 MG Tabs Generic drug: Methylnaltrexone  Bromide Take 3 tablets by mouth daily.   spironolactone  25 MG tablet Commonly known as: ALDACTONE  Take 1 tablet (25 mg total) by mouth daily.   tiotropium 18 MCG inhalation capsule Commonly known as: Spiriva  HandiHaler Place 1 capsule (18 mcg total) into inhaler and inhale daily.               Discharge Care Instructions  (From admission, onward)           Start     Ordered   10/24/23 0000  Discharge wound care:       Comments: Clean with soap and water , apply silver hydrofiber to fit wound beds, cover with ABD pads and secure with kerlex, change very other day   10/24/23 1030  Allergies  Allergen Reactions   Gabapentin Other (See Comments)    slept for 2 days   Naloxone  Other (See Comments)    Allergy not listed on MAR    Novocain [Procaine] Other (See Comments)    intolerance    Contact information for follow-up providers     [facility] Follow up.   Why: bipap /NIV Contact information: [phone]        [facility] Follow up.   Specialty: Home Health Services Why: Agency will call you to set up apt times Contact information: [address] [place] [address] [phone]              Contact information for after-discharge care     Destination     [facility] .   Service: Skilled Nursing Contact information: [address] [place]  [address] [phone]                      The results of significant diagnostics from this hospitalization  (including imaging, microbiology, ancillary and laboratory) are listed below for reference.    Significant Diagnostic Studies: CT Head Wo Contrast Result Date: 10/16/2023 CLINICAL DATA:  Mental status change EXAM: CT HEAD WITHOUT CONTRAST TECHNIQUE: Contiguous axial images were obtained from the base of the skull through the vertex without intravenous contrast. RADIATION DOSE REDUCTION: This exam was performed according to the departmental dose-optimization program which includes automated exposure control, adjustment of the mA and/or kV according to patient size and/or use of iterative reconstruction technique. COMPARISON:  06/26/2017 FINDINGS: Brain: No acute territorial infarction, hemorrhage or intracranial mass. Interval mild atrophy. Mild chronic small vessel ischemic changes of the white matter. Interval ventricular enlargement, likely due to atrophy progression Vascular: No hyperdense vessels.  No unexpected calcification Skull: Normal. Negative for fracture or focal lesion. Sinuses/Orbits: No acute finding. Other: None IMPRESSION: 1. No CT evidence for acute intracranial abnormality. 2. Interval mild atrophy and chronic small vessel ischemic changes of the white matter. Interval mild ventricular enlargement, likely due to atrophy progression. Electronically Signed   By: Luke Bun M.D.   On: 10/16/2023 21:41   DG Chest Portable 1 View Result Date: 10/16/2023 CLINICAL DATA:  Shortness of breath EXAM: PORTABLE CHEST 1 VIEW COMPARISON:  Radiograph and CT 09/19/2023 FINDINGS: Similar cardiomegaly. Aortic atherosclerotic calcification. Pulmonary vascular congestion. Interstitial coarsening and hazy airspace opacities in the lower lungs. Question small left pleural effusion. No pneumothorax. IMPRESSION: 1. Findings most compatible with CHF right and pulmonary edema. Electronically Signed   By: Norman Gatlin M.D.   On: 10/16/2023 18:53    Microbiology: Recent Results (from the past 240 hours)  MRSA  Next Gen by PCR, Nasal     Status: Abnormal   Collection Time: 10/17/23  6:04 PM   Specimen: Nasal Mucosa; Nasal Swab  Result Value Ref Range Status   MRSA by PCR Next Gen DETECTED (A) NOT DETECTED Final    Comment: (NOTE) The GeneXpert MRSA Assay (FDA approved for NASAL specimens only), is one component of a comprehensive MRSA colonization surveillance program. It is not intended to diagnose MRSA infection nor to guide or monitor treatment for MRSA infections. Test performance is not FDA approved in patients less than 35 years old. Performed at [facility], [address] [place], [place] [address]      Labs: Basic Metabolic Panel: Recent Labs  Lab 10/18/23 [phone] 10/19/23 0242 10/20/23 0309 10/21/23 0306 10/22/23 0225 10/23/23  1137 10/24/23 0243  NA 138 138 137 136 137 134* 136  K 3.2* 3.5 3.2* 3.3* 3.6 3.9 3.5  CL 98 98 97* 99 100 97* 96*  CO2 32 31 32 31 33* 31 34*  GLUCOSE 107* 85 107* 90 107* 89 106*  BUN 18 17 14 14 16 13 15   CREATININE 1.16* 1.24* 1.08* 0.97 1.03* 1.00 0.99  CALCIUM  8.5* 8.5* 8.5* 8.8* 9.4 9.5 9.7  MG 1.6* 1.6* 1.8 1.9 1.9  --   --   PHOS  --  3.5  --   --   --   --   --    Liver Function Tests: Recent Labs  Lab 10/19/23 0242  AST 27  ALT 15  ALKPHOS 66  BILITOT 0.9  PROT 7.0  ALBUMIN 2.2*   No results for input(s): LIPASE, AMYLASE in the last 168 hours. No results for input(s): AMMONIA in the last 168 hours. CBC: Recent Labs  Lab 10/18/23 0305 10/19/23 0242 10/20/23 0309 10/21/23 0306 10/22/23 0225  WBC 10.0 8.1 8.9 8.9 8.8  HGB 9.8* 9.5* 9.2* 9.0* 9.8*  HCT 31.8* 31.1* 30.2* 29.9* 32.3*  MCV 89.6 90.1 89.6 90.9 90.0  PLT 189 176 178 156 172   Cardiac Enzymes: No results for input(s): CKTOTAL, CKMB, CKMBINDEX, TROPONINI in the last 168 hours. BNP: BNP (last 3 results) Recent Labs    08/15/23 2036 10/16/23 1741  BNP 821.9* 923.4*    ProBNP (last 3 results) No results for input(s): PROBNP in the  last 8760 hours.  CBG: Recent Labs  Lab 10/19/23 2114 10/20/23 0645  GLUCAP 147* 115*       Signed:  Sigurd Pac MD.  [facility] 10/24/2023, 11:08 AM

## 2023-10-24 NOTE — Progress Notes (Signed)
   10/24/23 0045  BiPAP/CPAP/SIPAP  Reason BIPAP/CPAP not in use Non-compliant (Pt refused at this time. States she wants to wear when she gets home.)

## 2023-10-24 NOTE — TOC Transition Note (Signed)
 Transition of Care [facility]) - Discharge Note   Patient Details  Name: Morgan Odonnell MRN: [id number] Date of Birth: 1950-09-20  [facility]) CM/SW Contact:  Waddell Barnie Rama, RN Phone Number: 10/24/2023, 11:03 AM   Clinical Narrative:    For dc today, NCM notified Kelly with  [facility], patient spouse has take the bipap/NIV home yesterday.  Patient is going home by ptar and ptar has been scheduled.      Barriers to Discharge: Continued Medical Work up, English as a second language teacher   Patient Goals and CMS Choice Patient states their goals for this hospitalization and ongoing recovery are:: To go to rehab          Discharge Placement                       Discharge Plan and Services Additional resources added to the After Visit Summary for   In-house Referral: Clinical Social Work                                   Social Drivers of Health (SDOH) Interventions SDOH Screenings   Food Insecurity: No Food Insecurity (10/16/2023)  Housing: Low Risk  (10/16/2023)  Transportation Needs: No Transportation Needs (10/16/2023)  Utilities: Not At Risk (10/16/2023)  Depression (PHQ2-9): Medium Risk (01/13/2019)  Social Connections: Moderately Integrated (10/16/2023)  Tobacco Use: Medium Risk (10/16/2023)     Readmission Risk Interventions    09/19/2023    1:52 PM 08/17/2023    9:31 AM  Readmission Risk Prevention Plan  Transportation Screening Complete Complete  PCP or Specialist Appt within 5-7 Days  Complete  PCP or Specialist Appt within 3-5 Days Complete   Home Care Screening  Complete  Medication Review (RN CM)  Complete  HRI or Home Care Consult Complete   Social Work Consult for Recovery Care Planning/Counseling Complete   Palliative Care Screening Complete   Medication Review [employer]) Complete

## 2023-10-27 ENCOUNTER — Telehealth (HOSPITAL_BASED_OUTPATIENT_CLINIC_OR_DEPARTMENT_OTHER): Payer: Self-pay

## 2023-10-27 NOTE — Telephone Encounter (Signed)
 Left message for Morgan Odonnell to notify that this could be done also left our fax number to fax orders to [phone] at [employer]

## 2023-10-27 NOTE — Telephone Encounter (Signed)
 Copied from CRM #[id number]. Topic: Clinical - Medical Advice >> Oct 27, 2023  9:23 AM Leila C wrote: Reason for CRM: Burnard from [facility] [phone] states patient does not have an active pcp and is wondering if Dr. Annella will sign for therapy PT, OT and an aid. Burnard states patient cannot start services until a provider can sign. Burnard is working on patient getting a pcp. Please advise and call back.

## 2023-10-27 NOTE — Telephone Encounter (Signed)
 Sure, I am willing.

## 2023-10-30 ENCOUNTER — Other Ambulatory Visit: Payer: Self-pay

## 2023-10-30 ENCOUNTER — Emergency Department (HOSPITAL_COMMUNITY)

## 2023-10-30 ENCOUNTER — Inpatient Hospital Stay (HOSPITAL_COMMUNITY)
Admission: EM | Admit: 2023-10-30 | Discharge: 2023-11-12 | DRG: 177 | Disposition: A | Discharge status: Skilled Nursing Facility | Source: Ambulatory Visit | Attending: Internal Medicine | Admitting: Internal Medicine

## 2023-10-30 ENCOUNTER — Encounter (HOSPITAL_COMMUNITY): Payer: Self-pay | Admitting: Emergency Medicine

## 2023-10-30 DIAGNOSIS — N179 Acute kidney failure, unspecified: Secondary | ICD-10-CM | POA: Diagnosis not present

## 2023-10-30 DIAGNOSIS — Z87891 Personal history of nicotine dependence: Secondary | ICD-10-CM

## 2023-10-30 DIAGNOSIS — Z79890 Hormone replacement therapy (postmenopausal): Secondary | ICD-10-CM

## 2023-10-30 DIAGNOSIS — Z7401 Bed confinement status: Secondary | ICD-10-CM

## 2023-10-30 DIAGNOSIS — E662 Morbid (severe) obesity with alveolar hypoventilation: Secondary | ICD-10-CM | POA: Diagnosis present

## 2023-10-30 DIAGNOSIS — Z91199 Patient's noncompliance with other medical treatment and regimen due to unspecified reason: Secondary | ICD-10-CM

## 2023-10-30 DIAGNOSIS — J1569 Pneumonia due to other gram-negative bacteria: Secondary | ICD-10-CM | POA: Diagnosis not present

## 2023-10-30 DIAGNOSIS — J9622 Acute and chronic respiratory failure with hypercapnia: Secondary | ICD-10-CM | POA: Diagnosis present

## 2023-10-30 DIAGNOSIS — I5033 Acute on chronic diastolic (congestive) heart failure: Secondary | ICD-10-CM | POA: Diagnosis present

## 2023-10-30 DIAGNOSIS — J9621 Acute and chronic respiratory failure with hypoxia: Secondary | ICD-10-CM | POA: Diagnosis present

## 2023-10-30 DIAGNOSIS — Z825 Family history of asthma and other chronic lower respiratory diseases: Secondary | ICD-10-CM

## 2023-10-30 DIAGNOSIS — I890 Lymphedema, not elsewhere classified: Secondary | ICD-10-CM | POA: Diagnosis present

## 2023-10-30 DIAGNOSIS — Z7982 Long term (current) use of aspirin: Secondary | ICD-10-CM

## 2023-10-30 DIAGNOSIS — I130 Hypertensive heart and chronic kidney disease with heart failure and stage 1 through stage 4 chronic kidney disease, or unspecified chronic kidney disease: Secondary | ICD-10-CM | POA: Diagnosis present

## 2023-10-30 DIAGNOSIS — Z811 Family history of alcohol abuse and dependence: Secondary | ICD-10-CM

## 2023-10-30 DIAGNOSIS — E039 Hypothyroidism, unspecified: Secondary | ICD-10-CM | POA: Diagnosis present

## 2023-10-30 DIAGNOSIS — G9341 Metabolic encephalopathy: Secondary | ICD-10-CM | POA: Diagnosis present

## 2023-10-30 DIAGNOSIS — J189 Pneumonia, unspecified organism: Secondary | ICD-10-CM | POA: Diagnosis present

## 2023-10-30 DIAGNOSIS — Z6841 Body Mass Index (BMI) 40.0 and over, adult: Secondary | ICD-10-CM

## 2023-10-30 DIAGNOSIS — Z807 Family history of other malignant neoplasms of lymphoid, hematopoietic and related tissues: Secondary | ICD-10-CM

## 2023-10-30 DIAGNOSIS — I272 Pulmonary hypertension, unspecified: Secondary | ICD-10-CM | POA: Diagnosis present

## 2023-10-30 DIAGNOSIS — R410 Disorientation, unspecified: Secondary | ICD-10-CM | POA: Diagnosis not present

## 2023-10-30 DIAGNOSIS — Z8041 Family history of malignant neoplasm of ovary: Secondary | ICD-10-CM

## 2023-10-30 DIAGNOSIS — R54 Age-related physical debility: Secondary | ICD-10-CM | POA: Diagnosis present

## 2023-10-30 DIAGNOSIS — L8990 Pressure ulcer of unspecified site, unspecified stage: Secondary | ICD-10-CM | POA: Diagnosis present

## 2023-10-30 DIAGNOSIS — Z90721 Acquired absence of ovaries, unilateral: Secondary | ICD-10-CM

## 2023-10-30 DIAGNOSIS — J418 Mixed simple and mucopurulent chronic bronchitis: Secondary | ICD-10-CM

## 2023-10-30 DIAGNOSIS — N1831 Chronic kidney disease, stage 3a: Secondary | ICD-10-CM | POA: Diagnosis present

## 2023-10-30 DIAGNOSIS — E861 Hypovolemia: Secondary | ICD-10-CM | POA: Diagnosis not present

## 2023-10-30 DIAGNOSIS — Z7951 Long term (current) use of inhaled steroids: Secondary | ICD-10-CM

## 2023-10-30 DIAGNOSIS — Z8249 Family history of ischemic heart disease and other diseases of the circulatory system: Secondary | ICD-10-CM

## 2023-10-30 DIAGNOSIS — J440 Chronic obstructive pulmonary disease with acute lower respiratory infection: Secondary | ICD-10-CM | POA: Diagnosis present

## 2023-10-30 DIAGNOSIS — Z751 Person awaiting admission to adequate facility elsewhere: Secondary | ICD-10-CM

## 2023-10-30 DIAGNOSIS — Z79899 Other long term (current) drug therapy: Secondary | ICD-10-CM

## 2023-10-30 DIAGNOSIS — Z66 Do not resuscitate: Secondary | ICD-10-CM | POA: Diagnosis present

## 2023-10-30 DIAGNOSIS — Z818 Family history of other mental and behavioral disorders: Secondary | ICD-10-CM

## 2023-10-30 DIAGNOSIS — Z884 Allergy status to anesthetic agent status: Secondary | ICD-10-CM

## 2023-10-30 DIAGNOSIS — Z888 Allergy status to other drugs, medicaments and biological substances status: Secondary | ICD-10-CM

## 2023-10-30 DIAGNOSIS — E876 Hypokalemia: Secondary | ICD-10-CM | POA: Diagnosis present

## 2023-10-30 LAB — COMPREHENSIVE METABOLIC PANEL WITH GFR
ALT: 19 U/L (ref 0–44)
AST: 39 U/L (ref 15–41)
Albumin: 2.6 g/dL — ABNORMAL LOW (ref 3.5–5.0)
Alkaline Phosphatase: 91 U/L (ref 38–126)
Anion gap: 12 (ref 5–15)
BUN: 24 mg/dL — ABNORMAL HIGH (ref 8–23)
CO2: 27 mmol/L (ref 22–32)
Calcium: 9.9 mg/dL (ref 8.9–10.3)
Chloride: 97 mmol/L — ABNORMAL LOW (ref 98–111)
Creatinine, Ser: 1.39 mg/dL — ABNORMAL HIGH (ref 0.44–1.00)
GFR, Estimated: 41 mL/min — ABNORMAL LOW (ref >60)
Glucose, Bld: 111 mg/dL — ABNORMAL HIGH (ref 70–99)
Potassium: 3.5 mmol/L (ref 3.5–5.1)
Sodium: 136 mmol/L (ref 135–145)
Total Bilirubin: 1.4 mg/dL — ABNORMAL HIGH (ref 0.0–1.2)
Total Protein: 8.2 g/dL — ABNORMAL HIGH (ref 6.5–8.1)

## 2023-10-30 LAB — CBC WITH DIFFERENTIAL/PLATELET
Abs Immature Granulocytes: 0.06 K/uL (ref 0.00–0.07)
Basophils Absolute: 0.1 K/uL (ref 0.0–0.1)
Basophils Relative: 1 %
Eosinophils Absolute: 0.2 K/uL (ref 0.0–0.5)
Eosinophils Relative: 3 %
HCT: 40 % (ref 36.0–46.0)
Hemoglobin: 12.6 g/dL (ref 12.0–15.0)
Immature Granulocytes: 1 %
Lymphocytes Relative: 19 %
Lymphs Abs: 1.9 K/uL (ref 0.7–4.0)
MCH: 28.1 pg (ref 26.0–34.0)
MCHC: 31.5 g/dL (ref 30.0–36.0)
MCV: 89.3 fL (ref 80.0–100.0)
Monocytes Absolute: 0.8 K/uL (ref 0.1–1.0)
Monocytes Relative: 9 %
Neutro Abs: 6.6 K/uL (ref 1.7–7.7)
Neutrophils Relative %: 67 %
Platelets: 252 K/uL (ref 150–400)
RBC: 4.48 MIL/uL (ref 3.87–5.11)
RDW: 16.4 % — ABNORMAL HIGH (ref 11.5–15.5)
WBC: 9.7 K/uL (ref 4.0–10.5)
nRBC: 0 % (ref 0.0–0.2)

## 2023-10-30 LAB — I-STAT VENOUS BLOOD GAS, ED
Acid-Base Excess: 8 mmol/L — ABNORMAL HIGH (ref 0.0–2.0)
Bicarbonate: 31.7 mmol/L — ABNORMAL HIGH (ref 20.0–28.0)
Calcium, Ion: 1.12 mmol/L — ABNORMAL LOW (ref 1.15–1.40)
HCT: 40 % (ref 36.0–46.0)
Hemoglobin: 13.6 g/dL (ref 12.0–15.0)
O2 Saturation: 53 %
Potassium: 3.7 mmol/L (ref 3.5–5.1)
Sodium: 137 mmol/L (ref 135–145)
TCO2: 33 mmol/L — ABNORMAL HIGH (ref 22–32)
pCO2, Ven: 41.9 mmHg — ABNORMAL LOW (ref 44–60)
pH, Ven: 7.488 — ABNORMAL HIGH (ref 7.25–7.43)
pO2, Ven: 26 mmHg — CL (ref 32–45)

## 2023-10-30 LAB — BRAIN NATRIURETIC PEPTIDE: B Natriuretic Peptide: 167.4 pg/mL — ABNORMAL HIGH (ref 0.0–100.0)

## 2023-10-30 LAB — I-STAT CG4 LACTIC ACID, ED: Lactic Acid, Venous: 1.7 mmol/L (ref 0.5–1.9)

## 2023-10-30 LAB — T4, FREE: Free T4: 1.42 ng/dL — ABNORMAL HIGH (ref 0.61–1.12)

## 2023-10-30 LAB — MAGNESIUM: Magnesium: 1.8 mg/dL (ref 1.7–2.4)

## 2023-10-30 LAB — TSH: TSH: 9.677 u[IU]/mL — ABNORMAL HIGH (ref 0.350–4.500)

## 2023-10-30 LAB — CK: Total CK: 40 U/L (ref 38–234)

## 2023-10-30 LAB — TROPONIN I (HIGH SENSITIVITY): Troponin I (High Sensitivity): 14 ng/L (ref <18)

## 2023-10-30 MED ORDER — FUROSEMIDE 10 MG/ML IJ SOLN
40.0000 mg | Freq: Once | INTRAMUSCULAR | Status: AC
  Administered 2023-10-31: 40 mg via INTRAVENOUS
  Filled 2023-10-30: qty 4

## 2023-10-30 MED ORDER — VANCOMYCIN HCL 1750 MG/350ML IV SOLN
1750.0000 mg | Freq: Once | INTRAVENOUS | Status: AC
  Administered 2023-10-30: 1750 mg via INTRAVENOUS
  Filled 2023-10-30: qty 350

## 2023-10-30 MED ORDER — PIPERACILLIN-TAZOBACTAM 3.375 G IVPB 30 MIN
3.3750 g | Freq: Once | INTRAVENOUS | Status: AC
  Administered 2023-10-30: 3.375 g via INTRAVENOUS
  Filled 2023-10-30: qty 50

## 2023-10-30 NOTE — Progress Notes (Signed)
 ED Pharmacy Antibiotic Sign Off An antibiotic consult was received from an ED provider for vancomycin  and zosyn  per pharmacy dosing for pneumonia. A chart review was completed to assess appropriateness.  The following one time order(s) were placed per pharmacy consult:  vancomycin  1750 mg x 1 dose zosyn  3.375 mg x 1 dose  Further antibiotic and/or antibiotic pharmacy consults should be ordered by the admitting provider if indicated.   Thank you for allowing pharmacy to be a part of this patient's care.   R. Samual Satterfield, PharmD PGY-1 Acute Care Pharmacy Resident [facility] 10/30/2023 10:00 PM

## 2023-10-30 NOTE — ED Notes (Signed)
Respiratory notified about ABG order

## 2023-10-30 NOTE — H&P (Signed)
 History and Physical    Morgan Odonnell FMW:[id number] DOB: January 10, 1951 DOA: 10/30/2023  I have briefly reviewed the patient's prior medical records in [facility]  PCP: Pcp, No  Patient coming from: home  Chief Complaint: Increased confusion  HPI: Morgan Odonnell is a 73 y.o. female with medical history significant of morbid obesity, chronic hypoxic and hypercarbic respiratory failure on 4 L at home, chronic diastolic CHF, COPD, OSA, hypothyroidism, obesity hypoventilation, comes into the hospital brought by husband due to worsening confusion.  She was discharged about 5 days ago, and tells me that over the last couple of days has been having worsening confusion.  He he denies her having any fever, chills, nausea, vomiting, diarrhea.  Apparently SNF was recommended last time she was hospitalized however insurance declined.  ED Course: In the emergency room she is afebrile, normotensive, maintaining sats of low 90s on 4 L.  Blood work reveals a creatinine of 1.39, BNP of 167, CBC is unremarkable.  Head CT unremarkable.  Chest x-ray with findings concerning for CHF/pulmonary edema, also multifocal pneumonia could appear similarly.  BNP was elevated 167 but much better than the one at the beginning of August which was 923.  She was given antibiotics and we are asked to admit  Review of Systems: Unable to obtain full review of systems due to confusion  Past Medical History:  Diagnosis Date   Allergies    Anemia    Back pain    Bilateral swelling of feet    CHF (congestive heart failure) (HCC)    Chronic cough    COPD (chronic obstructive pulmonary disease) (HCC)    Depression    GERD (gastroesophageal reflux disease)    Heart murmur    Hypertension    Hypocapnia    Hypothyroid    Knee pain    Leg edema    chronic, bilateral   Lightheaded    Low back pain    Morbid obesity (HCC)    Neck pain    Obesity hypoventilation syndrome (HCC)    Oral lesion    Shortness of breath     Shortness of breath    Shoulder pain    Snoring     Past Surgical History:  Procedure Laterality Date   APPENDECTOMY     CHOLECYSTECTOMY     COLONOSCOPY N/A 06/25/2013   Procedure: COLONOSCOPY;  Surgeon: Belvie JONETTA Just, MD;  Location: [facility];  Service: Endoscopy;  Laterality: N/A;   ECTOPIC PREGNANCY SURGERY     Left Ovary Removed     MENISCUS REPAIR     TONSILLECTOMY     TUBAL LIGATION       reports that she quit smoking about 8 years ago. Her smoking use included cigarettes. She started smoking about 53 years ago. She has a 90 pack-year smoking history. She has never used smokeless tobacco. She reports that she does not drink alcohol and does not use drugs.  Allergies  Allergen Reactions   Gabapentin Other (See Comments)    slept for 2 days   Naloxone  Other (See Comments)    Allergy not listed on MAR    Novocain [Procaine] Other (See Comments)    intolerance    Family History  Problem Relation Age of Onset   Ovarian cancer Mother    Lung disease Mother    Heart failure Mother    Hypertension Mother    Heart disease Mother    Seizures Mother    Cancer Mother  Depression Mother    Sleep apnea Mother    Alcoholism Mother    Eating disorder Mother    Obesity Mother    Hypertension Father    Eating disorder Father    Obesity Father    Anxiety disorder Father    Lymphoma Brother    Ovarian cancer Maternal Aunt    Asthma Daughter     Prior to Admission medications   Medication Sig Start Date End Date Taking? Authorizing Provider  albuterol  (PROVENTIL  HFA;VENTOLIN  HFA) 108 (90 BASE) MCG/ACT inhaler Inhale 2 puffs into the lungs every 6 (six) hours as needed for wheezing.     [provider]  aspirin  EC 81 MG EC tablet Take 1 tablet (81 mg total) by mouth daily. 07/05/17   Rojelio Nest, DO  budesonide -formoterol  (SYMBICORT ) 160-4.5 MCG/ACT inhaler INHALE TWO puffs into lung TWICE DAILY 07/09/22   Hunsucker, Donnice SAUNDERS, MD  cholecalciferol   (VITAMIN D3) 25 MCG (1000 UNIT) tablet Take 5,000 Units by mouth daily.    [provider]  citalopram  (CELEXA ) 20 MG tablet Take 1 tablet (20 mg total) by mouth daily. 07/04/17   Rojelio Nest, DO  diclofenac  sodium (VOLTAREN ) 1 % GEL Apply 1 application topically 4 (four) times daily. Applied to knees, shoulders, wrists, and ankles    [provider]  furosemide  (LASIX ) 40 MG tablet Take 1 tablet (40 mg total) by mouth 2 (two) times daily. 10/24/23   Fairy Frames, MD  ipratropium-albuterol  (DUONEB) 0.5-2.5 (3) MG/3ML SOLN Take 3 mLs by nebulization every 6 (six) hours as needed (wheezing with dyspnea). 09/26/23   Rizwan, Saima, MD  levothyroxine  (SYNTHROID ) 200 MCG tablet Take 1 tablet (200 mcg total) by mouth daily before breakfast. 03/24/19   Prentiss Frieze, DO  naloxegol  oxalate (MOVANTIK ) 25 MG TABS tablet Take 25 mg by mouth daily.    [provider]  polyethylene glycol (MIRALAX  / GLYCOLAX ) 17 g packet Take 17 g by mouth daily as needed for mild constipation. 09/01/23   Jadine Toribio SQUIBB, MD  potassium chloride  (KLOR-CON ) 20 MEQ packet Take 20 mEq by mouth 2 (two) times daily. 10/24/23   Fairy Frames, MD  RELISTOR  150 MG TABS Take 3 tablets by mouth daily. 09/29/23   [provider]  Spacer/Aero-Holding Chambers (AEROCHAMBER Z-STAT PLUS [place]) MISC Use as directed 05/10/15   Noreen Tonnie BRAVO, MD  spironolactone  (ALDACTONE ) 25 MG tablet Take 1 tablet (25 mg total) by mouth daily. 10/24/23   Joseph, Preetha, MD  tiotropium (SPIRIVA  HANDIHALER) 18 MCG inhalation capsule Place 1 capsule (18 mcg total) into inhaler and inhale daily. 07/09/22   Hunsucker, Donnice SAUNDERS, MD    Physical Exam: Vitals:   10/30/23 2145 10/30/23 2200 10/30/23 2230 10/30/23 2245  BP: 95/68 122/81 (!) 115/93 (!) 109/53  Pulse: 67 76 75 71  Resp: 17 17 (!) 23 15  Temp:      TempSrc:      SpO2: 100% 94% 96% 93%    On constitutional: Appears confused Eyes: No scleral icterus ENMT:  Mucous membranes are moist.  Neck: normal, supple Respiratory: clear to auscultation bilaterally, no wheezing, no crackles.  Difficult auscultation due to body habitus Cardiovascular: Regular rate and rhythm, no murmurs / rubs / gallops.  Trace edema/lymphedema bilateral lower extremities Abdomen: no tenderness, no masses palpated. Bowel sounds positive.  Musculoskeletal: no clubbing / cyanosis. Normal muscle tone.  Skin: no rashes, lesions, ulcers. No induration Neurologic: No focal deficits, alert to self  Labs on Admission: I have personally  reviewed following labs and imaging studies  CBC: Recent Labs  Lab 10/30/23 2045 10/30/23 2058  WBC 9.7  --   NEUTROABS 6.6  --   HGB 12.6 13.6  HCT 40.0 40.0  MCV 89.3  --   PLT 252  --    Basic Metabolic Panel: Recent Labs  Lab 10/24/23 0243 10/30/23 2045 10/30/23 2058  NA 136 136 137  K 3.5 3.5 3.7  CL 96* 97*  --   CO2 34* 27  --   GLUCOSE 106* 111*  --   BUN 15 24*  --   CREATININE 0.99 1.39*  --   CALCIUM  9.7 9.9  --   MG  --  1.8  --    Liver Function Tests: Recent Labs  Lab 10/30/23 2045  AST 39  ALT 19  ALKPHOS 91  BILITOT 1.4*  PROT 8.2*  ALBUMIN 2.6*   Coagulation Profile: No results for input(s): INR, PROTIME in the last 168 hours. BNP (last 3 results) No results for input(s): PROBNP in the last 8760 hours. CBG: No results for input(s): GLUCAP in the last 168 hours. Thyroid  Function Tests: Recent Labs    10/30/23 2045  TSH 9.677*  FREET4 1.42*   Urine analysis:    Component Value Date/Time   COLORURINE YELLOW 08/27/2023 1933   APPEARANCEUR CLEAR 08/27/2023 1933   LABSPEC 1.015 08/27/2023 1933   PHURINE 6.0 08/27/2023 1933   GLUCOSEU NEGATIVE 08/27/2023 1933   HGBUR SMALL (A) 08/27/2023 1933   BILIRUBINUR NEGATIVE 08/27/2023 1933   KETONESUR NEGATIVE 08/27/2023 1933   PROTEINUR NEGATIVE 08/27/2023 1933   UROBILINOGEN 0.2 04/01/2007 1526   NITRITE NEGATIVE 08/27/2023 1933    LEUKOCYTESUR NEGATIVE 08/27/2023 1933     Radiological Exams on Admission: CT Head Wo Contrast Result Date: 10/30/2023 CLINICAL DATA:  Altered mental status EXAM: CT HEAD WITHOUT CONTRAST TECHNIQUE: Contiguous axial images were obtained from the base of the skull through the vertex without intravenous contrast. RADIATION DOSE REDUCTION: This exam was performed according to the departmental dose-optimization program which includes automated exposure control, adjustment of the mA and/or kV according to patient size and/or use of iterative reconstruction technique. COMPARISON:  None Available. FINDINGS: Brain: There is atrophy and chronic small vessel disease changes. No acute intracranial abnormality. Specifically, no hemorrhage, hydrocephalus, mass lesion, acute infarction, or significant intracranial injury. Vascular: No hyperdense vessel or unexpected calcification. Skull: No acute calvarial abnormality. Sinuses/Orbits: No acute findings Other: None IMPRESSION: Atrophy, chronic microvascular disease. No acute intracranial abnormality. Electronically Signed   By: Franky Crease M.D.   On: 10/30/2023 22:32   DG Chest Portable 1 View Result Date: 10/30/2023 CLINICAL DATA:  Reported hypoxia and altered mental status EXAM: PORTABLE CHEST 1 VIEW COMPARISON:  10/16/2023 FINDINGS: Stable cardiomegaly. Aortic atherosclerotic calcification. Pulmonary vascular congestion. Diffuse interstitial coarsening. Bi basilar airspace opacities similar to prior. No definite pleural effusion. No pneumothorax. IMPRESSION: Findings favor CHF with pulmonary edema. Multifocal pneumonia could appear similarly. Electronically Signed   By: Norman Gatlin M.D.   On: 10/30/2023 21:20    EKG: Independently reviewed. Sinus rhythm   Assessment/Plan Principal problem Acute metabolic encephalopathy -patient had a VBG, obtain an ABG to evaluate for hypercarbia. - Last time she was hospitalized she did well with nocturnal BiPAP, will  continue for now - Closely monitor mental status, she is alert, awake but very confused -  Active problems Acute on chronic hypoxic and hypercapnic respiratory failure, severe OSA/OHS -she appears close to baseline, BiPAP as above nightly  Acute on chronic diastolic CHF, moderate pulmonary hypertension, moderate RV failure -last 2D echocardiogram 6/25 showed an EF of 50-55%, moderately reduced RV, moderately elevated PASP - Chest x-ray shows some fluid overload even though her BNP is lower than the beginning of August - Furosemide  x 1, repeat renal function and monitor fluid status in the morning  Possible CAP -unable to entirely rule out, will continue antibiotics for now, check procalcitonin, low threshold to DC antibiotics if cultures remain negative for 24 hours and procalcitonin is negative  Obesity, morbid-BMI 44  COPD-no wheezing, continue home nebulizers  Lymphedema-receiving Lasix   Disposition-suspect she will need SNF, consult PT/OT  Hypothyroidism-check a TSH given confusion, continue home Synthroid   Med rec not done at the time of the admission  DVT prophylaxis: Lovenox   Code Status: DNR  Family Communication: Husband at bedside Disposition Plan: Home versus SNF when ready Bed Type: Cardiac telemetry Consults called: None Obs/Inp: Observation   Nilda Fendt, MD, PhD [facility]  Contact via [email or web address]  10/30/2023, 11:43 PM

## 2023-10-30 NOTE — ED Provider Notes (Signed)
 [place] [facility] AT [facility] Provider Note   CSN: [id number] Arrival date & time: 10/30/23  2011     Patient presents with: Altered Mental Status   Morgan Odonnell is a 73 y.o. female past medical history significant for pulmonary hypertension, RV failure with chronic hypoxic respiratory failure on 4 L baseline, COPD, hypertension, HFpEF recently admitted and discharged on 8/15 for acute metabolic encephalopathy secondary to hypercarbic respiratory failure and acute HFpEF exacerbation who presents emergency department for altered mental status.  History is obtained from EMS.  EMS states that the patient stated that the patient has become more confused and altered compared to her baseline over the past 1 to 2 days.  Initially earlier today patient was able to refuse EMS however became altered to the point where she was unable to refuse EMS and patient was able to call 911 in order to bring her to the hospital.  Patient is alert and responds to questions however is a poor historian and is unable to tell me what brought her into the hospital and if she has any chief complaints.    Altered Mental Status      Prior to Admission medications   Medication Sig Start Date End Date Taking? Authorizing Provider  albuterol  (PROVENTIL  HFA;VENTOLIN  HFA) 108 (90 BASE) MCG/ACT inhaler Inhale 2 puffs into the lungs every 6 (six) hours as needed for wheezing.     [provider]  aspirin  EC 81 MG EC tablet Take 1 tablet (81 mg total) by mouth daily. 07/05/17   Rojelio Nest, DO  budesonide -formoterol  (SYMBICORT ) 160-4.5 MCG/ACT inhaler INHALE TWO puffs into lung TWICE DAILY 07/09/22   Hunsucker, Donnice SAUNDERS, MD  cholecalciferol  (VITAMIN D3) 25 MCG (1000 UNIT) tablet Take 5,000 Units by mouth daily.    [provider]  citalopram  (CELEXA ) 20 MG tablet Take 1 tablet (20 mg total) by mouth daily. 07/04/17   Rojelio Nest, DO  diclofenac  sodium (VOLTAREN ) 1 % GEL  Apply 1 application topically 4 (four) times daily. Applied to knees, shoulders, wrists, and ankles    [provider]  furosemide  (LASIX ) 40 MG tablet Take 1 tablet (40 mg total) by mouth 2 (two) times daily. 10/24/23   Fairy Frames, MD  ipratropium-albuterol  (DUONEB) 0.5-2.5 (3) MG/3ML SOLN Take 3 mLs by nebulization every 6 (six) hours as needed (wheezing with dyspnea). 09/26/23   Rizwan, Saima, MD  levothyroxine  (SYNTHROID ) 200 MCG tablet Take 1 tablet (200 mcg total) by mouth daily before breakfast. 03/24/19   Prentiss Frieze, DO  naloxegol  oxalate (MOVANTIK ) 25 MG TABS tablet Take 25 mg by mouth daily.    [provider]  polyethylene glycol (MIRALAX  / GLYCOLAX ) 17 g packet Take 17 g by mouth daily as needed for mild constipation. 09/01/23   Jadine Toribio SQUIBB, MD  potassium chloride  (KLOR-CON ) 20 MEQ packet Take 20 mEq by mouth 2 (two) times daily. 10/24/23   Fairy Frames, MD  RELISTOR  150 MG TABS Take 3 tablets by mouth daily. 09/29/23   [provider]  Spacer/Aero-Holding Chambers (AEROCHAMBER Z-STAT PLUS [place]) MISC Use as directed 05/10/15   Noreen Tonnie BRAVO, MD  spironolactone  (ALDACTONE ) 25 MG tablet Take 1 tablet (25 mg total) by mouth daily. 10/24/23   Joseph, Preetha, MD  tiotropium (SPIRIVA  HANDIHALER) 18 MCG inhalation capsule Place 1 capsule (18 mcg total) into inhaler and inhale daily. 07/09/22   Hunsucker, Donnice SAUNDERS, MD    Allergies: Gabapentin, Naloxone , and Novocain [procaine]    Review  of Systems  Updated Vital Signs BP (!) 109/53   Pulse 71   Temp 97.7 F (36.5 C) (Rectal)   Resp 15   SpO2 93%   Physical Exam Vitals reviewed.  Constitutional:      Appearance: She is morbidly obese. She is ill-appearing.     Comments: Chronically ill-appearing  HENT:     Head: Normocephalic.  Cardiovascular:     Rate and Rhythm: Normal rate.     Pulses:          Radial pulses are 2+ on the right side and 2+ on the left side.     Comments: Difficult  to appreciate cardiac auscultation secondary to body habitus.  Pulmonary:     Effort: Tachypnea present.     Breath sounds: Decreased breath sounds present.     Comments: Difficult to appreciate pulmonary auscultation secondary to body habitus Abdominal:     Tenderness: There is generalized abdominal tenderness.  Musculoskeletal:     Cervical back: Normal range of motion.     Right lower leg: Edema present.     Left lower leg: Edema present.     Comments: Spontaneous movement of bilateral upper and lower extremities  Neurological:     Mental Status: She is alert.     (all labs ordered are listed, but only abnormal results are displayed) Labs Reviewed  CBC WITH DIFFERENTIAL/PLATELET - Abnormal; Notable for the following components:      Result Value   RDW 16.4 (*)    All other components within normal limits  COMPREHENSIVE METABOLIC PANEL WITH GFR - Abnormal; Notable for the following components:   Chloride 97 (*)    Glucose, Bld 111 (*)    BUN 24 (*)    Creatinine, Ser 1.39 (*)    Total Protein 8.2 (*)    Albumin 2.6 (*)    Total Bilirubin 1.4 (*)    GFR, Estimated 41 (*)    All other components within normal limits  TSH - Abnormal; Notable for the following components:   TSH 9.677 (*)    All other components within normal limits  T4, FREE - Abnormal; Notable for the following components:   Free T4 1.42 (*)    All other components within normal limits  BRAIN NATRIURETIC PEPTIDE - Abnormal; Notable for the following components:   B Natriuretic Peptide 167.4 (*)    All other components within normal limits  I-STAT VENOUS BLOOD GAS, ED - Abnormal; Notable for the following components:   pH, Ven 7.488 (*)    pCO2, Ven 41.9 (*)    pO2, Ven 26 (*)    Bicarbonate 31.7 (*)    TCO2 33 (*)    Acid-Base Excess 8.0 (*)    Calcium , Ion 1.12 (*)    All other components within normal limits  URINE CULTURE  CULTURE, BLOOD (ROUTINE X 2)  CULTURE, BLOOD (ROUTINE X 2)  MAGNESIUM    CK  RAPID URINE DRUG SCREEN, HOSP PERFORMED  URINALYSIS, ROUTINE W REFLEX MICROSCOPIC  BLOOD GAS, ARTERIAL  I-STAT CG4 LACTIC ACID, ED  I-STAT CG4 LACTIC ACID, ED  TROPONIN I (HIGH SENSITIVITY)  TROPONIN I (HIGH SENSITIVITY)    EKG: EKG Interpretation Date/Time:  Thursday October 30 2023 21:56:29 EDT Ventricular Rate:  72 PR Interval:  249 QRS Duration:  109 QT Interval:  449 QTC Calculation: 492 R Axis:   156  Text Interpretation: Sinus rhythm Prolonged PR interval Probable RVH w/ secondary repol abnormality Minimal ST elevation, lateral leads Borderline  prolonged QT interval No significant change since last tracing Confirmed by Dreama Longs ([id number]) on 10/30/2023 10:06:49 PM  Radiology: CT Head Wo Contrast Result Date: 10/30/2023 CLINICAL DATA:  Altered mental status EXAM: CT HEAD WITHOUT CONTRAST TECHNIQUE: Contiguous axial images were obtained from the base of the skull through the vertex without intravenous contrast. RADIATION DOSE REDUCTION: This exam was performed according to the departmental dose-optimization program which includes automated exposure control, adjustment of the mA and/or kV according to patient size and/or use of iterative reconstruction technique. COMPARISON:  None Available. FINDINGS: Brain: There is atrophy and chronic small vessel disease changes. No acute intracranial abnormality. Specifically, no hemorrhage, hydrocephalus, mass lesion, acute infarction, or significant intracranial injury. Vascular: No hyperdense vessel or unexpected calcification. Skull: No acute calvarial abnormality. Sinuses/Orbits: No acute findings Other: None IMPRESSION: Atrophy, chronic microvascular disease. No acute intracranial abnormality. Electronically Signed   By: Franky Crease M.D.   On: 10/30/2023 22:32   DG Chest Portable 1 View Result Date: 10/30/2023 CLINICAL DATA:  Reported hypoxia and altered mental status EXAM: PORTABLE CHEST 1 VIEW COMPARISON:  10/16/2023 FINDINGS:  Stable cardiomegaly. Aortic atherosclerotic calcification. Pulmonary vascular congestion. Diffuse interstitial coarsening. Bi basilar airspace opacities similar to prior. No definite pleural effusion. No pneumothorax. IMPRESSION: Findings favor CHF with pulmonary edema. Multifocal pneumonia could appear similarly. Electronically Signed   By: Norman Gatlin M.D.   On: 10/30/2023 21:20     Procedures   Medications Ordered in the ED  vancomycin  (VANCOREADY) IVPB 1750 mg/350 mL (1,750 mg Intravenous New Bag/Given 10/30/23 2245)  furosemide  (LASIX ) injection 40 mg (has no administration in time range)  piperacillin -tazobactam (ZOSYN ) IVPB 3.375 g (0 g Intravenous Stopped 10/30/23 2241)    Clinical Course as of 10/30/23 2351  Thu Oct 30, 2023  2048 Attempted to call patients husband without answer  [AG]  2118 Chest x-ray reviewed by me: Trachea is midline.  Cardiomegaly present.  Improvement in comparison to previous x-ray performed 10/16/2023 [AG]  2224 CTH reviewed by me wo ICH [AG]  2236 EKG reviewed by me: 1st degree block with normal axis and prolonged QTc. No evidence of acute ischemia. Nonspecific T wave inversion in lead III [AG]  2313 Hospitalist consult placed [AG]    Clinical Course User Index [AG] Nada Chroman, DO                                 Medical Decision Making Amount and/or Complexity of Data Reviewed Labs: ordered. Radiology: ordered.  Risk Prescription drug management. Decision regarding hospitalization.   On initial evaluation patient is hemodynamically stable, afebrile and saturating well on baseline 4 L oxygen .  Patient is unable to give an accurate history or answer questions appropriately.  Patient is able to follow simple commands.  Based on patient's initial examination and limited history concern for altered mental status therefore will obtain laboratory studies as well as a CT head and chest x-ray.  Differential diagnosis remains broad.  Less likely ACS/MI  as patient has nonischemic EKG she findings and troponins within normal limits.  Less likely rhabdomyolysis as patient CK is within normal limits.  Cannot rule out infection at this time as patient has evidence of possible focal consolidation on chest x-ray with altered mental status therefore we will give Coverage.  Patient does have evidence of pulmonary edema on chest x-ray as well with concern for acute on chronic HFpEF exacerbation.  Less likely ACS/MI as  patient does not have obvious focal neurologic deficits and CT head without acute intracranial abnormalities.  Upon laboratory review there is no significant electrolyte abnormalities, anemia.  At this time believe patient's presentation is most likely secondary to pneumonia versus HFpEF exacerbation therefore patient will be admitted to hospitalist for continued care and management     Final diagnoses:  None    ED Discharge Orders     None       Lavanda Bolster DO Emergency Medicine PGY2    Bolster Lavanda, DO 10/30/23 2351    Dreama Longs, MD 10/31/23 1340

## 2023-10-30 NOTE — ED Notes (Signed)
 Patient transported to CT

## 2023-10-30 NOTE — ED Triage Notes (Signed)
 Pt coming from home for altered mental status. Per family started having more altered behavior over the day. Pt refused ems the first couple times. Pt also having more SOB

## 2023-10-31 ENCOUNTER — Encounter (HOSPITAL_COMMUNITY): Payer: Self-pay | Admitting: Internal Medicine

## 2023-10-31 ENCOUNTER — Other Ambulatory Visit: Payer: Self-pay

## 2023-10-31 DIAGNOSIS — Z8249 Family history of ischemic heart disease and other diseases of the circulatory system: Secondary | ICD-10-CM | POA: Diagnosis not present

## 2023-10-31 DIAGNOSIS — J9621 Acute and chronic respiratory failure with hypoxia: Secondary | ICD-10-CM | POA: Diagnosis present

## 2023-10-31 DIAGNOSIS — Z7982 Long term (current) use of aspirin: Secondary | ICD-10-CM | POA: Diagnosis not present

## 2023-10-31 DIAGNOSIS — E039 Hypothyroidism, unspecified: Secondary | ICD-10-CM | POA: Diagnosis present

## 2023-10-31 DIAGNOSIS — J189 Pneumonia, unspecified organism: Secondary | ICD-10-CM | POA: Diagnosis not present

## 2023-10-31 DIAGNOSIS — Z7189 Other specified counseling: Secondary | ICD-10-CM | POA: Diagnosis not present

## 2023-10-31 DIAGNOSIS — E876 Hypokalemia: Secondary | ICD-10-CM | POA: Diagnosis present

## 2023-10-31 DIAGNOSIS — I5033 Acute on chronic diastolic (congestive) heart failure: Secondary | ICD-10-CM | POA: Diagnosis present

## 2023-10-31 DIAGNOSIS — Z884 Allergy status to anesthetic agent status: Secondary | ICD-10-CM | POA: Diagnosis not present

## 2023-10-31 DIAGNOSIS — G9341 Metabolic encephalopathy: Secondary | ICD-10-CM

## 2023-10-31 DIAGNOSIS — J440 Chronic obstructive pulmonary disease with acute lower respiratory infection: Secondary | ICD-10-CM | POA: Diagnosis present

## 2023-10-31 DIAGNOSIS — Z7951 Long term (current) use of inhaled steroids: Secondary | ICD-10-CM | POA: Diagnosis not present

## 2023-10-31 DIAGNOSIS — J1569 Pneumonia due to other gram-negative bacteria: Secondary | ICD-10-CM | POA: Diagnosis present

## 2023-10-31 DIAGNOSIS — J9622 Acute and chronic respiratory failure with hypercapnia: Secondary | ICD-10-CM | POA: Diagnosis present

## 2023-10-31 DIAGNOSIS — R410 Disorientation, unspecified: Secondary | ICD-10-CM | POA: Diagnosis present

## 2023-10-31 DIAGNOSIS — Z87891 Personal history of nicotine dependence: Secondary | ICD-10-CM | POA: Diagnosis not present

## 2023-10-31 DIAGNOSIS — Z66 Do not resuscitate: Secondary | ICD-10-CM | POA: Diagnosis present

## 2023-10-31 DIAGNOSIS — Z79899 Other long term (current) drug therapy: Secondary | ICD-10-CM | POA: Diagnosis not present

## 2023-10-31 DIAGNOSIS — Z79890 Hormone replacement therapy (postmenopausal): Secondary | ICD-10-CM | POA: Diagnosis not present

## 2023-10-31 DIAGNOSIS — N179 Acute kidney failure, unspecified: Secondary | ICD-10-CM | POA: Diagnosis not present

## 2023-10-31 DIAGNOSIS — E662 Morbid (severe) obesity with alveolar hypoventilation: Secondary | ICD-10-CM | POA: Diagnosis present

## 2023-10-31 DIAGNOSIS — Z6841 Body Mass Index (BMI) 40.0 and over, adult: Secondary | ICD-10-CM | POA: Diagnosis not present

## 2023-10-31 DIAGNOSIS — Z515 Encounter for palliative care: Secondary | ICD-10-CM | POA: Diagnosis not present

## 2023-10-31 DIAGNOSIS — Z825 Family history of asthma and other chronic lower respiratory diseases: Secondary | ICD-10-CM | POA: Diagnosis not present

## 2023-10-31 DIAGNOSIS — J418 Mixed simple and mucopurulent chronic bronchitis: Secondary | ICD-10-CM | POA: Diagnosis not present

## 2023-10-31 DIAGNOSIS — I130 Hypertensive heart and chronic kidney disease with heart failure and stage 1 through stage 4 chronic kidney disease, or unspecified chronic kidney disease: Secondary | ICD-10-CM | POA: Diagnosis present

## 2023-10-31 DIAGNOSIS — I272 Pulmonary hypertension, unspecified: Secondary | ICD-10-CM | POA: Diagnosis present

## 2023-10-31 DIAGNOSIS — Z888 Allergy status to other drugs, medicaments and biological substances status: Secondary | ICD-10-CM | POA: Diagnosis not present

## 2023-10-31 DIAGNOSIS — L8990 Pressure ulcer of unspecified site, unspecified stage: Secondary | ICD-10-CM | POA: Insufficient documentation

## 2023-10-31 DIAGNOSIS — N1831 Chronic kidney disease, stage 3a: Secondary | ICD-10-CM | POA: Diagnosis present

## 2023-10-31 LAB — CBC
HCT: 35.1 % — ABNORMAL LOW (ref 36.0–46.0)
Hemoglobin: 11.3 g/dL — ABNORMAL LOW (ref 12.0–15.0)
MCH: 27.4 pg (ref 26.0–34.0)
MCHC: 32.2 g/dL (ref 30.0–36.0)
MCV: 85 fL (ref 80.0–100.0)
Platelets: 255 K/uL (ref 150–400)
RBC: 4.13 MIL/uL (ref 3.87–5.11)
RDW: 16.6 % — ABNORMAL HIGH (ref 11.5–15.5)
WBC: 9.8 K/uL (ref 4.0–10.5)
nRBC: 0 % (ref 0.0–0.2)

## 2023-10-31 LAB — MAGNESIUM: Magnesium: 1.7 mg/dL (ref 1.7–2.4)

## 2023-10-31 LAB — I-STAT ARTERIAL BLOOD GAS, ED
Acid-Base Excess: 7 mmol/L — ABNORMAL HIGH (ref 0.0–2.0)
Bicarbonate: 31.5 mmol/L — ABNORMAL HIGH (ref 20.0–28.0)
Calcium, Ion: 1.26 mmol/L (ref 1.15–1.40)
HCT: 33 % — ABNORMAL LOW (ref 36.0–46.0)
Hemoglobin: 11.2 g/dL — ABNORMAL LOW (ref 12.0–15.0)
O2 Saturation: 89 %
Patient temperature: 36.4
Potassium: 2.6 mmol/L — CL (ref 3.5–5.1)
Sodium: 137 mmol/L (ref 135–145)
TCO2: 33 mmol/L — ABNORMAL HIGH (ref 22–32)
pCO2 arterial: 43.9 mmHg (ref 32–48)
pH, Arterial: 7.462 — ABNORMAL HIGH (ref 7.35–7.45)
pO2, Arterial: 51 mmHg — ABNORMAL LOW (ref 83–108)

## 2023-10-31 LAB — URINALYSIS, ROUTINE W REFLEX MICROSCOPIC
Bilirubin Urine: NEGATIVE
Glucose, UA: NEGATIVE mg/dL
Ketones, ur: NEGATIVE mg/dL
Nitrite: NEGATIVE
Protein, ur: 100 mg/dL — AB
RBC / HPF: >50 RBC/hpf (ref 0–5)
Specific Gravity, Urine: 1.02 (ref 1.005–1.030)
WBC, UA: >50 WBC/hpf (ref 0–5)
pH: 5 (ref 5.0–8.0)

## 2023-10-31 LAB — BASIC METABOLIC PANEL WITH GFR
Anion gap: 9 (ref 5–15)
BUN: 23 mg/dL (ref 8–23)
CO2: 26 mmol/L (ref 22–32)
Calcium: 8.8 mg/dL — ABNORMAL LOW (ref 8.9–10.3)
Chloride: 100 mmol/L (ref 98–111)
Creatinine, Ser: 1.37 mg/dL — ABNORMAL HIGH (ref 0.44–1.00)
GFR, Estimated: 41 mL/min — ABNORMAL LOW (ref >60)
Glucose, Bld: 82 mg/dL (ref 70–99)
Potassium: 4.1 mmol/L (ref 3.5–5.1)
Sodium: 135 mmol/L (ref 135–145)

## 2023-10-31 LAB — COMPREHENSIVE METABOLIC PANEL WITH GFR
ALT: 16 U/L (ref 0–44)
AST: 29 U/L (ref 15–41)
Albumin: 2.2 g/dL — ABNORMAL LOW (ref 3.5–5.0)
Alkaline Phosphatase: 75 U/L (ref 38–126)
Anion gap: 10 (ref 5–15)
BUN: 22 mg/dL (ref 8–23)
CO2: 27 mmol/L (ref 22–32)
Calcium: 9.3 mg/dL (ref 8.9–10.3)
Chloride: 100 mmol/L (ref 98–111)
Creatinine, Ser: 1.29 mg/dL — ABNORMAL HIGH (ref 0.44–1.00)
GFR, Estimated: 44 mL/min — ABNORMAL LOW (ref >60)
Glucose, Bld: 83 mg/dL (ref 70–99)
Potassium: 2.7 mmol/L — CL (ref 3.5–5.1)
Sodium: 137 mmol/L (ref 135–145)
Total Bilirubin: 1.6 mg/dL — ABNORMAL HIGH (ref 0.0–1.2)
Total Protein: 7.3 g/dL (ref 6.5–8.1)

## 2023-10-31 LAB — RAPID URINE DRUG SCREEN, HOSP PERFORMED
Amphetamines: NOT DETECTED
Barbiturates: NOT DETECTED
Benzodiazepines: NOT DETECTED
Cocaine: NOT DETECTED
Opiates: NOT DETECTED
Tetrahydrocannabinol: NOT DETECTED

## 2023-10-31 LAB — TSH: TSH: 7.484 u[IU]/mL — ABNORMAL HIGH (ref 0.350–4.500)

## 2023-10-31 LAB — PROCALCITONIN: Procalcitonin: 0.1 ng/mL

## 2023-10-31 LAB — TROPONIN I (HIGH SENSITIVITY): Troponin I (High Sensitivity): 15 ng/L (ref <18)

## 2023-10-31 MED ORDER — POTASSIUM CHLORIDE CRYS ER 20 MEQ PO TBCR
40.0000 meq | EXTENDED_RELEASE_TABLET | ORAL | Status: AC
Start: 2023-10-31 — End: 2023-10-31
  Administered 2023-10-31 (×2): 40 meq via ORAL
  Filled 2023-10-31 (×2): qty 2

## 2023-10-31 MED ORDER — SPIRONOLACTONE 25 MG PO TABS
25.0000 mg | ORAL_TABLET | Freq: Every day | ORAL | Status: DC
  Administered 2023-10-31 – 2023-11-12 (×13): 25 mg via ORAL
  Filled 2023-10-31 (×13): qty 1

## 2023-10-31 MED ORDER — PIPERACILLIN-TAZOBACTAM 3.375 G IVPB
3.3750 g | Freq: Three times a day (TID) | INTRAVENOUS | Status: DC
  Administered 2023-10-31 – 2023-11-04 (×13): 3.375 g via INTRAVENOUS
  Filled 2023-10-31 (×15): qty 50

## 2023-10-31 MED ORDER — ACETAMINOPHEN 650 MG RE SUPP: 650.0000 mg | Freq: Four times a day (QID) | RECTAL | Status: DC | PRN

## 2023-10-31 MED ORDER — CITALOPRAM HYDROBROMIDE 20 MG PO TABS
20.0000 mg | ORAL_TABLET | Freq: Every day | ORAL | Status: DC
Start: 2023-10-31 — End: 2023-11-12
  Administered 2023-10-31 – 2023-11-12 (×13): 20 mg via ORAL
  Filled 2023-10-31 (×13): qty 1

## 2023-10-31 MED ORDER — UMECLIDINIUM BROMIDE 62.5 MCG/ACT IN AEPB
1.0000 | INHALATION_SPRAY | Freq: Every day | RESPIRATORY_TRACT | Status: DC
  Administered 2023-10-31 – 2023-11-12 (×10): 1 via RESPIRATORY_TRACT
  Filled 2023-10-31 (×2): qty 7

## 2023-10-31 MED ORDER — IPRATROPIUM-ALBUTEROL 0.5-2.5 (3) MG/3ML IN SOLN: 3.0000 mL | Freq: Four times a day (QID) | RESPIRATORY_TRACT | Status: DC | PRN

## 2023-10-31 MED ORDER — POLYETHYLENE GLYCOL 3350 17 G PO PACK
17.0000 g | PACK | Freq: Every day | ORAL | Status: DC | PRN
  Administered 2023-10-31: 17 g via ORAL
  Filled 2023-10-31: qty 1

## 2023-10-31 MED ORDER — METHYLNALTREXONE BROMIDE 150 MG PO TABS: 3.0000 | ORAL_TABLET | Freq: Every day | ORAL | Status: DC

## 2023-10-31 MED ORDER — ACETAMINOPHEN 325 MG PO TABS
650.0000 mg | ORAL_TABLET | Freq: Four times a day (QID) | ORAL | Status: DC | PRN
Start: 2023-10-31 — End: 2023-11-12
  Administered 2023-10-31 – 2023-11-11 (×13): 650 mg via ORAL
  Filled 2023-10-31 (×13): qty 2

## 2023-10-31 MED ORDER — ONDANSETRON HCL 4 MG/2ML IJ SOLN: 4.0000 mg | Freq: Four times a day (QID) | INTRAMUSCULAR | Status: DC | PRN

## 2023-10-31 MED ORDER — POTASSIUM CHLORIDE 10 MEQ/100ML IV SOLN
10.0000 meq | INTRAVENOUS | Status: AC
  Administered 2023-10-31 (×6): 10 meq via INTRAVENOUS
  Filled 2023-10-31 (×6): qty 100

## 2023-10-31 MED ORDER — FLUTICASONE FUROATE-VILANTEROL 200-25 MCG/ACT IN AEPB
1.0000 | INHALATION_SPRAY | Freq: Every day | RESPIRATORY_TRACT | Status: DC
  Administered 2023-10-31 – 2023-11-12 (×13): 1 via RESPIRATORY_TRACT
  Filled 2023-10-31: qty 28

## 2023-10-31 MED ORDER — NALOXEGOL OXALATE 25 MG PO TABS
25.0000 mg | ORAL_TABLET | Freq: Every day | ORAL | Status: DC
  Administered 2023-10-31: 25 mg via ORAL
  Filled 2023-10-31 (×3): qty 1

## 2023-10-31 MED ORDER — TIOTROPIUM BROMIDE MONOHYDRATE 18 MCG IN CAPS: 1.0000 | ORAL_CAPSULE | Freq: Every day | RESPIRATORY_TRACT | Status: DC

## 2023-10-31 MED ORDER — LEVOTHYROXINE SODIUM 100 MCG PO TABS
200.0000 ug | ORAL_TABLET | Freq: Every day | ORAL | Status: DC
  Administered 2023-10-31 – 2023-11-12 (×13): 200 ug via ORAL
  Filled 2023-10-31 (×13): qty 2

## 2023-10-31 MED ORDER — POTASSIUM CHLORIDE CRYS ER 20 MEQ PO TBCR: 40.0000 meq | EXTENDED_RELEASE_TABLET | ORAL | Status: AC

## 2023-10-31 MED ORDER — DICLOFENAC SODIUM 1 % EX GEL
2.0000 g | Freq: Four times a day (QID) | CUTANEOUS | Status: DC
  Administered 2023-10-31 – 2023-11-12 (×36): 2 g via TOPICAL
  Filled 2023-10-31 (×2): qty 100

## 2023-10-31 MED ORDER — POTASSIUM CHLORIDE 10 MEQ/100ML IV SOLN
10.0000 meq | INTRAVENOUS | Status: AC
  Administered 2023-10-31 (×2): 10 meq via INTRAVENOUS
  Filled 2023-10-31 (×2): qty 100

## 2023-10-31 MED ORDER — VANCOMYCIN HCL 1750 MG/350ML IV SOLN
1750.0000 mg | INTRAVENOUS | Status: DC
  Administered 2023-11-01 – 2023-11-03 (×2): 1750 mg via INTRAVENOUS
  Filled 2023-10-31 (×2): qty 350

## 2023-10-31 MED ORDER — BACLOFEN 10 MG PO TABS
10.0000 mg | ORAL_TABLET | Freq: Three times a day (TID) | ORAL | Status: DC
  Administered 2023-10-31 – 2023-11-12 (×35): 10 mg via ORAL
  Filled 2023-10-31 (×36): qty 1

## 2023-10-31 MED ORDER — ASPIRIN 81 MG PO TBEC
81.0000 mg | DELAYED_RELEASE_TABLET | Freq: Every day | ORAL | Status: DC
  Administered 2023-10-31 – 2023-11-12 (×13): 81 mg via ORAL
  Filled 2023-10-31 (×13): qty 1

## 2023-10-31 MED ORDER — ENOXAPARIN SODIUM 40 MG/0.4ML IJ SOSY
40.0000 mg | PREFILLED_SYRINGE | INTRAMUSCULAR | Status: DC
  Administered 2023-10-31 – 2023-11-04 (×4): 40 mg via SUBCUTANEOUS
  Filled 2023-10-31 (×4): qty 0.4

## 2023-10-31 MED ORDER — ONDANSETRON HCL 4 MG PO TABS: 4.0000 mg | ORAL_TABLET | Freq: Four times a day (QID) | ORAL | Status: DC | PRN

## 2023-10-31 MED ORDER — VITAMIN D 25 MCG (1000 UNIT) PO TABS
5000.0000 [IU] | ORAL_TABLET | Freq: Every day | ORAL | Status: DC
  Administered 2023-10-31 – 2023-11-12 (×13): 5000 [IU] via ORAL
  Filled 2023-10-31 (×13): qty 5

## 2023-10-31 NOTE — Plan of Care (Signed)
  Problem: Clinical Measurements: Goal: Respiratory complications will improve Outcome: Progressing Goal: Cardiovascular complication will be avoided Outcome: Progressing   Problem: Clinical Measurements: Goal: Cardiovascular complication will be avoided Outcome: Progressing   Problem: Activity: Goal: Risk for activity intolerance will decrease Outcome: Not Progressing   Problem: Skin Integrity: Goal: Risk for impaired skin integrity will decrease Outcome: Not Progressing

## 2023-10-31 NOTE — Progress Notes (Signed)
 Patient husband states patient has been on resmed for majority of the day. Patient request break.  Patient placed on 4L [place].  VSS.

## 2023-10-31 NOTE — Progress Notes (Signed)
 Pharmacy Antibiotic Note  Morgan Odonnell is a 73 y.o. female admitted on 10/30/2023 with pneumonia.  Pharmacy has been consulted for Vancomycin  dosing. WBC WNL. Scr 1.39.   Plan: Vancomycin  1750 mg IV q48h >>>Estimated AUC: 534 Zosyn  3.375G IV q8h to be infused over 4 hours Trend WBC, temp, renal function  F/U infectious work-up Drug levels as indicated   Height: 5' 2 (157.5 cm) Weight: 114.4 kg (252 lb 3.3 oz) IBW/kg (Calculated) : 50.1  Temp (24hrs), Avg:97.9 F (36.6 C), Min:97.7 F (36.5 C), Max:98 F (36.7 C)  Recent Labs  Lab 10/30/23 2045 10/30/23 2103  WBC 9.7  --   CREATININE 1.39*  --   LATICACIDVEN  --  1.7    Estimated Creatinine Clearance: 44.4 mL/min (A) (by C-G formula based on SCr of 1.39 mg/dL (H)).    Allergies  Allergen Reactions   Gabapentin Other (See Comments)    slept for 2 days   Naloxone  Other (See Comments)    Allergy not listed on MAR    Novocain [Procaine] Other (See Comments)    intolerance    Lynwood Mckusick, PharmD, BCPS Clinical Pharmacist Phone: [phone]

## 2023-10-31 NOTE — Evaluation (Signed)
 Occupational Therapy Evaluation Patient Details Name: Morgan Odonnell MRN: [id number] DOB: [birth year] Today's Date: 10/31/2023   History of Present Illness   73 y.o female admitted 10/30/23 with AMS, CAP. 8/22 hypokalemia. PMH: admission 7/11-7/18 with D/C to SNF, return home 8/3, readmit 8/7-8/15 with AMS. CHF, OSA, COPD, hypothyroidism, obesity, chronic respiratory failure on 4L, depression,chronic back pain, HTN     Clinical Impressions PT admitted with AMS CAP and hyokalemia. Pt currently with functional limitiations due to the deficits listed below (see OT problem list). Pt at baseline in lift recliner due to spouse inability to transfer patient due to generalized weakness. Pt increased risk for falls and no hoyer lift in the home. Spouse reports no home services since d/c home. Family requesting maximized services to assist in care. Pt high risk for skin break down and recommendation for air mattress overlay for skin integrity.  Pt will benefit from skilled OT to increase their independence and safety with adls and balance to allow discharge skilled inpatient follow up therapy, <3 hours/day.   Call me [name].      If plan is discharge home, recommend the following:   Two people to help with walking and/or transfers;Two people to help with bathing/dressing/bathroom     Functional Status Assessment   Patient has had a recent decline in their functional status and demonstrates the ability to make significant improvements in function in a reasonable and predictable amount of time.     Equipment Recommendations   Hospital bed;Hoyer lift;Wheelchair cushion (measurements OT);Wheelchair (measurements OT) (aide)     Recommendations for Other Services         Precautions/Restrictions   Precautions Precautions: Fall;Other (comment) Recall of Precautions/Restrictions: Impaired Precaution/Restrictions Comments: Fragile skin, edema, O2 dep     Mobility Bed  Mobility Overal bed mobility: Needs Assistance Bed Mobility: Supine to Sit, Sit to Supine     Supine to sit: Max assist, +2 for physical assistance Sit to supine: Max assist, +2 for physical assistance   General bed mobility comments: physical assist to move legs and trunk to pivot to EOB with pad and back. max +2 to slide toward [facility]    Transfers Overall transfer level: Needs assistance   Transfers: Sit to/from Stand Sit to Stand: Mod assist, +2 physical assistance           General transfer comment: mod +2 with knees blocked and use of pad, pt able to clear sacrum but could not fully extend hips/knees      Balance Overall balance assessment: Needs assistance Sitting-balance support: No upper extremity supported, Feet supported Sitting balance-Leahy Scale: Fair Sitting balance - Comments: EOB with CGA   Standing balance support: Bilateral upper extremity supported, During functional activity Standing balance-Leahy Scale: Zero Standing balance comment: knees blocked and +2 assist                           ADL either performed or assessed with clinical judgement   ADL Overall ADL's : Needs assistance/impaired                                       General ADL Comments: (A) for all adls at this time. pt with cognitive deficits affecting adls and iadls     Vision         Perception         Praxis  Pertinent Vitals/Pain Pain Assessment Pain Assessment: Faces Faces Pain Scale: Hurts little more Pain Location: legs with movement Pain Descriptors / Indicators: Discomfort, Aching Pain Intervention(s): Limited activity within patient's tolerance, Monitored during session, Repositioned     Extremity/Trunk Assessment Upper Extremity Assessment Upper Extremity Assessment: Generalized weakness;Right hand dominant;RUE deficits/detail;LUE deficits/detail RUE Deficits / Details: tremor noted in all functional movement and spouse  reports started within a week prior to admission AROM hand wrist hand elbow and shoulder  pt unable to sustain shoulder flexion against gravity LUE Deficits / Details: tremor noted in all functional movement and spouse reports started within a week prior to admission AROM hand wrist hand elbow and shoulder pt unable to sustain shoulder flexion against gravity   Lower Extremity Assessment Lower Extremity Assessment: Defer to PT evaluation RLE Deficits / Details: 2-/5 strength with ROM limited by body habitus. hip with slight lateral external rotation, unable to bend knee successfully in supine, can achieve 90/90 sitting EOB LLE Deficits / Details: hip external rotation maintained throughout with limited ability to move hip and leg with assist, body habitus impairing ROM. able to shift leg with max assist grossly 2-/5 strength, 90/90 hip/knee EOB   Cervical / Trunk Assessment Cervical / Trunk Assessment: Kyphotic Cervical / Trunk Exceptions: increased body habitus   Communication Communication Communication: No apparent difficulties Factors Affecting Communication: Reduced clarity of speech   Cognition Arousal: Alert Behavior During Therapy: WFL for tasks assessed/performed Cognition: Cognition impaired     Awareness: Intellectual awareness impaired, Online awareness impaired       OT - Cognition Comments: pt is able to verbalize spouse name. pt reports hospital. but pt perseverating with same answer over and over. pt needs name call and pause to introduce new command/ question                 Following commands: Impaired Following commands impaired: Follows one step commands with increased time, Follows one step commands inconsistently     Cueing  General Comments   Cueing Techniques: Verbal cues;Tactile cues;Gestural cues  VSS on 4L [place]   Exercises     Shoulder Instructions      Home Living Family/patient expects to be discharged to:: Skilled nursing facility Living  Arrangements: Spouse/significant other Available Help at Discharge: Family;Available 24 hours/day Type of Home: House Home Access: Ramped entrance     Home Layout: One level     Bathroom Shower/Tub: Walk-in Pensions consultant: Standard     Home Equipment: Agricultural consultant (2 wheels);Cane - single point;Wheelchair - manual;Shower seat;Grab bars - tub/shower;Adaptive equipment;Hand held shower head;Hospital bed;Toilet riser;Lift chair (hospital bed per spouse is a child size) Adaptive Equipment: Reacher Additional Comments: O2 at baseline  Lives With: Spouse    Prior Functioning/Environment Prior Level of Function : Needs assist       Physical Assist : Mobility (physical);ADLs (physical) Mobility (physical): Transfers ADLs (physical): Bathing;Dressing;Toileting;IADLs Mobility Comments: Sleeping in recliner. +2 to pivot to rollator and push pt to bathroom for bathing. Prior to admission in July had been +1 assist to perform pivots, non ambulation x 1 year ADLs Comments: Toileting using bedpan by rolling in recliner and times transfer to bathroom +2 assist to transfer to sit on rollator, sponge bathe with assist    OT Problem List: Decreased strength;Decreased range of motion;Decreased activity tolerance;Impaired balance (sitting and/or standing);Decreased safety awareness;Decreased cognition;Decreased knowledge of use of DME or AE;Cardiopulmonary status limiting activity   OT Treatment/Interventions: Self-care/ADL training;Therapeutic exercise;Energy conservation;DME and/or AE instruction;Therapeutic  activities;Patient/family education;Balance training      OT Goals(Current goals can be found in the care plan section)   Acute Rehab OT Goals Patient Stated Goal: none statd OT Goal Formulation: With family Time For Goal Achievement: 11/14/23 Potential to Achieve Goals: Good   OT Frequency:  Min 2X/week    Co-evaluation PT/OT/SLP Co-Evaluation/Treatment:  Yes Reason for Co-Treatment: Complexity of the patient's impairments (multi-system involvement) PT goals addressed during session: Mobility/safety with mobility;Balance OT goals addressed during session: ADL's and self-care      AM-PAC OT 6 Clicks Daily Activity     Outcome Measure Help from another person eating meals?: A Lot Help from another person taking care of personal grooming?: A Lot Help from another person toileting, which includes using toliet, bedpan, or urinal?: Total Help from another person bathing (including washing, rinsing, drying)?: Total Help from another person to put on and taking off regular upper body clothing?: Total Help from another person to put on and taking off regular lower body clothing?: Total 6 Click Score: 8   End of Session Equipment Utilized During Treatment: Oxygen  Nurse Communication: Mobility status;Precautions  Activity Tolerance: Patient tolerated treatment well Patient left: in bed;with call bell/phone within reach;with bed alarm set;with family/visitor present  OT Visit Diagnosis: Unsteadiness on feet (R26.81);Other abnormalities of gait and mobility (R26.89);Muscle weakness (generalized) (M62.81)                Time: 9144-9079 OT Time Calculation (min): 25 min Charges:  OT General Charges $OT Visit: 1 Visit OT Evaluation $OT Eval Moderate Complexity: 1 Mod   Brynn, OTR/L  Acute Rehabilitation Services Office: [phone] .   Ely Molt 10/31/2023, 11:14 AM

## 2023-10-31 NOTE — Care Management Obs Status (Signed)
 MEDICARE OBSERVATION STATUS NOTIFICATION   Patient Details  Name: MILAGROS MIDDENDORF MRN: [id number] Date of Birth: 10-23-1950   Medicare Observation Status Notification Given:  Yes    [name] 10/31/2023, 10:03 AM

## 2023-10-31 NOTE — ED Notes (Signed)
 Per admitting provider to hold Lasix  until Potassium is replaced.

## 2023-10-31 NOTE — Progress Notes (Signed)
 Entered patients room, found patient sleeping and having apneic breather patterns. MD at bedside assessing patient. Bipap re-initiated.

## 2023-10-31 NOTE — NC FL2 (Signed)
   MEDICAID FL2 LEVEL OF CARE FORM     IDENTIFICATION  Patient Name: Morgan Odonnell Birthdate: [birth year] Sex: female Admission Date (Current Location): 10/30/2023  [facility] and [place] Number:  Producer, television/film/video and Address:  The . [facility], [address], [place], [place] [address]      Provider Number: [id number]  Attending Physician Name and Address:  Raenelle Coria, MD  Relative Name and Phone Number:  Joany Khatib (spouse) [phone], Janayia Burggraf (son) [phone]    Current Level of Care: Hospital Recommended Level of Care: Skilled Nursing Facility Prior Approval Number:    Date Approved/Denied:   PASRR Number: [id number]  Discharge Plan: SNF    Current Diagnoses: Patient Active Problem List   Diagnosis Date Noted   Pressure injury of skin 10/31/2023   CAP (community acquired pneumonia) 10/30/2023   Acute metabolic encephalopathy 10/16/2023   Hypokalemia 10/16/2023   Acute on chronic heart failure with preserved ejection fraction (HFpEF) (HCC) 09/19/2023   Hyponatremia 08/29/2023   Dysphagia 08/29/2023   Palliative care encounter 08/29/2023   Counseling and coordination of care 08/29/2023   Goals of care, counseling/discussion 08/29/2023   Heart failure (HCC) 08/29/2023   Need for emotional support 08/29/2023   Multifocal pneumonia 08/26/2023   Normocytic anemia 08/16/2023   Precordial pain 02/21/2021   Exertional dyspnea 02/21/2021   Acute on chronic respiratory failure with hypoxia and hypercapnia (HCC) 06/27/2017   Hyperkalemia 06/27/2017   Acute renal failure superimposed on stage 2 chronic kidney disease (HCC) 06/27/2017   Restrictive lung disease 12/13/2015   Obesity hypoventilation syndrome (HCC) 05/10/2015   Acute and chronic respiratory failure with hypoxia (HCC) 05/10/2015   Acute on chronic diastolic CHF (congestive heart failure) (HCC) 03/03/2015   OSA (obstructive sleep apnea) 03/02/2015   Morbid  obesity with BMI of 50.0-59.9, adult (HCC) 10/18/2013   Lymphedema 01/26/2013   Chronic diastolic CHF (congestive heart failure) (HCC) 01/04/2013   Chronic cough    COPD (chronic obstructive pulmonary disease)Gold C    Snoring    Low back pain    Hypertension     Orientation RESPIRATION BLADDER Height & Weight     Self, Place  O2 (Nasal Cannula 4 liters) Incontinent, External catheter (External Urinary Catheter) Weight: 252 lb 3.3 oz (114.4 kg) Height:  5' 2 (157.5 cm)  BEHAVIORAL SYMPTOMS/MOOD NEUROLOGICAL BOWEL NUTRITION STATUS      Incontinent Diet (Please see discharge summary)  AMBULATORY STATUS COMMUNICATION OF NEEDS Skin   Extensive Assist Verbally Other (Comment) (Abrasion,thigh,arm,R,Lower,upper,ecchymosis,arm,Bil.,Erythema,buttocks,sacrum,perineum,arm,groin,Bil.,scratch marks,buttocks,R,wound/Incision LDAs,Wound/Incision open or dehisced non-pressure wound,leg,R,lower please see additional info)                       Personal Care Assistance Level of Assistance    Bathing Assistance: Maximum assistance Feeding assistance: Maximum assistance Dressing Assistance: Maximum assistance     Functional Limitations Info    Sight Info: Impaired Hearing Info: Impaired Speech Info: Adequate    SPECIAL CARE FACTORS FREQUENCY  PT (By licensed PT), OT (By licensed OT)     PT Frequency: 5x min weekly OT Frequency: 5x min weekly            Contractures Contractures Info: Not present    Additional Factors Info  Code Status, Allergies Code Status Info: DNR Allergies Info: Gabapentin,Naloxone ,Novocain (procaine)           Current Medications (10/31/2023):  This is the current hospital active medication list Current Facility-Administered Medications  Medication Dose Route  Frequency Provider Last Rate Last Admin   acetaminophen  (TYLENOL ) tablet 650 mg  650 mg Oral Q6H PRN Gherghe, Costin M, MD   650 mg at 10/31/23 9163   Or   acetaminophen  (TYLENOL ) suppository  650 mg  650 mg Rectal Q6H PRN Gherghe, Costin M, MD       aspirin  EC tablet 81 mg  81 mg Oral Daily Gherghe, Costin M, MD   81 mg at 10/31/23 9163   baclofen  (LIORESAL ) tablet 10 mg  10 mg Oral TID Ghimire, Kuber, MD   10 mg at 10/31/23 0836   cholecalciferol  (VITAMIN D3) 25 MCG (1000 UNIT) tablet 5,000 Units  5,000 Units Oral Daily Ghimire, Kuber, MD   5,000 Units at 10/31/23 9163   citalopram  (CELEXA ) tablet 20 mg  20 mg Oral Daily Ghimire, Kuber, MD   20 mg at 10/31/23 9161   diclofenac  Sodium (VOLTAREN ) 1 % topical gel 2 g  2 g Topical QID Ghimire, Kuber, MD   2 g at 10/31/23 1115   enoxaparin  (LOVENOX ) injection 40 mg  40 mg Subcutaneous Q24H Gherghe, Costin M, MD   40 mg at 10/31/23 0148   fluticasone  furoate-vilanterol (BREO ELLIPTA ) 200-25 MCG/ACT 1 puff  1 puff Inhalation Daily Gherghe, Costin M, MD   1 puff at 10/31/23 9161   ipratropium-albuterol  (DUONEB) 0.5-2.5 (3) MG/3ML nebulizer solution 3 mL  3 mL Nebulization Q6H PRN Gherghe, Costin M, MD       levothyroxine  (SYNTHROID ) tablet 200 mcg  200 mcg Oral QAC breakfast Gherghe, Costin M, MD   200 mcg at 10/31/23 0542   Methylnaltrexone  Bromide TABS 450 mg  3 tablet Oral Daily Ghimire, Kuber, MD       naloxegol  oxalate (MOVANTIK ) tablet 25 mg  25 mg Oral Daily Ghimire, Kuber, MD   25 mg at 10/31/23 1115   ondansetron  (ZOFRAN ) tablet 4 mg  4 mg Oral Q6H PRN Gherghe, Costin M, MD       Or   ondansetron  (ZOFRAN ) injection 4 mg  4 mg Intravenous Q6H PRN Gherghe, Costin M, MD       piperacillin -tazobactam (ZOSYN ) IVPB 3.375 g  3.375 g Intravenous Q8H Ledford, James L, RPH 12.5 mL/hr at 10/31/23 0600 Infusion Verify at 10/31/23 0600   polyethylene glycol (MIRALAX  / GLYCOLAX ) packet 17 g  17 g Oral Daily PRN Raenelle Coria, MD   17 g at 10/31/23 0837   potassium chloride  10 mEq in 100 mL IVPB  10 mEq Intravenous Q1 Hr x 6 Samtani, Jai-Gurmukh, MD 100 mL/hr at 10/31/23 1353 10 mEq at 10/31/23 1353   spironolactone  (ALDACTONE ) tablet 25 mg  25 mg  Oral Daily Ghimire, Kuber, MD   25 mg at 10/31/23 0836   umeclidinium bromide  (INCRUSE ELLIPTA ) 62.5 MCG/ACT 1 puff  1 puff Inhalation Daily Trixie Nilda HERO, MD   1 puff at 10/31/23 0838   [START ON 11/01/2023] vancomycin  (VANCOREADY) IVPB 1750 mg/350 mL  1,750 mg Intravenous Q48H Ledford, James L, [facility]         Discharge Medications: Please see discharge summary for a list of discharge medications.  Relevant Imaging Results:  Relevant Lab Results:   Additional Information SSN-[id number],Wound/Incision open or dehisced non-pressure wound pretibial,L,wound PI sacrum,R,L,stage 1,wound,arm,distal,L,lower,posterior  Isaiah Public, LCSWA

## 2023-10-31 NOTE — Evaluation (Addendum)
 Physical Therapy Evaluation Patient Details Name: Morgan Odonnell MRN: [id number] DOB: Aug 19, 1950 Today's Date: 10/31/2023  History of Present Illness  73 y.o female admitted 10/30/23 with AMS, CAP. 8/22 hypokalemia. PMH: admission 7/11-7/18 with D/C to SNF, return home 8/3, readmit 8/7-8/15 with AMS. CHF, OSA, COPD, hypothyroidism, obesity, chronic respiratory failure on 4L, depression,chronic back pain, HTN  Clinical Impression  Pt pleasantly confused, oriented to self and spouse in room but perseverates on question with inability to transition. Pt with assist of spouse at home without additional help or DME received from last admission and pt requiring 2 person assist which is not available at home. Spouse with desire for pt cognition and mobility to improve to decrease burden of care. Pt was previously able to pivot with +1 assist and will maintain this goal acutely. Pt with decreased strength, function, cognition, skin integrity and mobility who will benefit from acute therapy to maximize mobility and safety.   93% on 4L 99/66 (70)      If plan is discharge home, recommend the following: Two people to help with walking and/or transfers;Assist for transportation;Supervision due to cognitive status;Two people to help with bathing/dressing/bathroom;Direct supervision/assist for financial management;Direct supervision/assist for medications management;Other (comment) (CNA)   Can travel by private vehicle   No    Equipment Recommendations [employer];Hospital bed  Recommendations for Other Services      Functional Status Assessment Patient has had a recent decline in their functional status and/or demonstrates limited ability to make significant improvements in function in a reasonable and predictable amount of time     Precautions / Restrictions Precautions Precautions: Fall;Other (comment) Recall of Precautions/Restrictions: Impaired Precaution/Restrictions Comments: Fragile skin,  edema, O2 dep      Mobility  Bed Mobility Overal bed mobility: Needs Assistance Bed Mobility: Supine to Sit, Sit to Supine     Supine to sit: Max assist, +2 for physical assistance Sit to supine: Max assist, +2 for physical assistance   General bed mobility comments: physical assist to move legs and trunk to pivot to EOB with pad and back. max +2 to slide toward [facility]    Transfers Overall transfer level: Needs assistance   Transfers: Sit to/from Stand Sit to Stand: Mod assist, +2 physical assistance           General transfer comment: mod +2 with knees blocked and use of pad, pt able to clear sacrum but could not fully extend hips/knees    Ambulation/Gait                  Stairs            Wheelchair Mobility     Tilt Bed    Modified Rankin (Stroke Patients Only)       Balance Overall balance assessment: Needs assistance Sitting-balance support: No upper extremity supported, Feet supported Sitting balance-Leahy Scale: Fair Sitting balance - Comments: EOB with CGA   Standing balance support: Bilateral upper extremity supported, During functional activity Standing balance-Leahy Scale: Zero Standing balance comment: knees blocked and +2 assist                             Pertinent Vitals/Pain Pain Assessment Pain Score: 4  Pain Location: legs with movement Pain Descriptors / Indicators: Discomfort, Aching Pain Intervention(s): Limited activity within patient's tolerance, Monitored during session, Repositioned    Home Living Family/patient expects to be discharged to:: Skilled nursing facility Living Arrangements: Spouse/significant other Available  Help at Discharge: Family;Available 24 hours/day Type of Home: House Home Access: Ramped entrance       Home Layout: One level Home Equipment: Agricultural consultant (2 wheels);Cane - single point;Wheelchair - manual;Shower seat;Grab bars - tub/shower;Adaptive equipment;Hand held shower  head;Hospital bed;Toilet riser;Lift chair      Prior Function Prior Level of Function : Needs assist       Physical Assist : Mobility (physical);ADLs (physical) Mobility (physical): Transfers ADLs (physical): Bathing;Dressing;Toileting;IADLs Mobility Comments: Sleeping in recliner. +2 to pivot to rollator and push pt to bathroom for bathing. Prior to admission in July had been +1 assist to perform pivots, non ambulation x 1 year ADLs Comments: Toileting using bedpan by rolling in recliner and times transfer to bathroom +2 assist to transfer to sit on rollator, sponge bathe with assist     Extremity/Trunk Assessment   Upper Extremity Assessment Upper Extremity Assessment: Defer to OT evaluation    Lower Extremity Assessment RLE Deficits / Details: 2-/5 strength with ROM limited by body habitus. hip with slight lateral external rotation, unable to bend knee successfully in supine, can achieve 90/90 sitting EOB LLE Deficits / Details: hip external rotation maintained throughout with limited ability to move hip and leg with assist, body habitus impairing ROM. able to shift leg with max assist grossly 2-/5 strength, 90/90 hip/knee EOB    Cervical / Trunk Assessment Cervical / Trunk Assessment: Kyphotic Cervical / Trunk Exceptions: increased body habitus  Communication   Communication Communication: No apparent difficulties Factors Affecting Communication: Reduced clarity of speech    Cognition Arousal: Alert Behavior During Therapy: WFL for tasks assessed/performed   PT - Cognitive impairments: Awareness, Memory, Problem solving, Safety/Judgement, Orientation                       PT - Cognition Comments: Slow processing. oriented to self and spouse. perseverates on question and could not provide answers to further orientation questions Following commands: Impaired Following commands impaired: Follows one step commands with increased time, Follows one step commands  inconsistently     Cueing Cueing Techniques: Verbal cues, Tactile cues, Gestural cues     General Comments      Exercises     Assessment/Plan    PT Assessment Patient needs continued PT services  PT Problem List Decreased mobility;Decreased activity tolerance;Decreased strength;Cardiopulmonary status limiting activity;Obesity;Decreased skin integrity;Pain;Decreased cognition;Decreased knowledge of use of DME;Decreased balance;Decreased safety awareness;Decreased range of motion       PT Treatment Interventions DME instruction;Balance training;Functional mobility training;Therapeutic activities;Therapeutic exercise;Patient/family education;Wheelchair mobility training;Neuromuscular re-education;Cognitive remediation    PT Goals (Current goals can be found in the Care Plan section)  Acute Rehab PT Goals Patient Stated Goal: be able to to move PT Goal Formulation: With patient Time For Goal Achievement: 11/14/23 Potential to Achieve Goals: Fair    Frequency Min 2X/week     Co-evaluation PT/OT/SLP Co-Evaluation/Treatment: Yes Reason for Co-Treatment: Complexity of the patient's impairments (multi-system involvement) PT goals addressed during session: Mobility/safety with mobility;Balance         AM-PAC PT 6 Clicks Mobility  Outcome Measure Help needed turning from your back to your side while in a flat bed without using bedrails?: A Lot Help needed moving from lying on your back to sitting on the side of a flat bed without using bedrails?: Total Help needed moving to and from a bed to a chair (including a wheelchair)?: Total Help needed standing up from a chair using your arms (e.g., wheelchair or bedside  chair)?: Total Help needed to walk in hospital room?: Total Help needed climbing 3-5 steps with a railing? : Total 6 Click Score: 7    End of Session Equipment Utilized During Treatment: Oxygen  Activity Tolerance: Patient tolerated treatment well Patient left: in  bed;with call bell/phone within reach;with family/visitor present;with bed alarm set Nurse Communication: Mobility status PT Visit Diagnosis: Muscle weakness (generalized) (M62.81);Difficulty in walking, not elsewhere classified (R26.2)    Time: 9143-9082 PT Time Calculation (min) (ACUTE ONLY): 21 min   Charges:   PT Evaluation $PT Eval Moderate Complexity: 1 Mod   PT General Charges $$ ACUTE PT VISIT: 1 Visit         Lenoard SQUIBB, PT Acute Rehabilitation Services Office: [phone]   Lenoard KATHEE Docker 10/31/2023, 10:55 AM

## 2023-10-31 NOTE — TOC Initial Note (Addendum)
 Transition of Care [facility]) - Initial/Assessment Note    Patient Details  Name: Morgan Odonnell MRN: [id number] Date of Birth: 1950-06-13  [facility] CM/SW Contact:    Isaiah Public, LCSWA Phone Number: 10/31/2023, 12:48 PM  Clinical Narrative:                  CSW received consult for possible SNF placement at time of discharge. Due to patients current orientation CSW LVM for patients spouse [name]. CSW awaiting call back to discuss  PT recommendation of SNF placement for patient at time of discharge. CSW to continue to follow and assist with discharge planning needs.      Update-CSW spoke with patients son [name] regarding PT recommendation of SNF placement for patient at time of discharge. Patients son reports PTA patient comes from home with spouse,daughter,and Granddaughter.Patients son [name] informed CSW that  patient's spouse is currently unable to care for patient at their home given patient's current physical needs and fall risk. Patients son expressed understanding of PT recommendation and is agreeable to SNF placement for patient at time of discharge. Patients son reports preference for [facility] . Patients son gave CSW permission to fax out initial referral for SNF placement.CSW discussed insurance authorization process.. Patients son reports patients spouse can bring patients Bipap from home to facility. No further questions reported at this time. CSW to continue to follow and assist with discharge planning needs.        Patient Goals and CMS Choice            Expected Discharge Plan and Services                                              Prior Living Arrangements/Services                       Activities of Daily Living   ADL Screening (condition at time of admission) Independently performs ADLs?: No Does the patient have a NEW difficulty with bathing/dressing/toileting/self-feeding that is expected to last >3 days?: No  (baseline not able to get up due to lymphedema as per husband) Does the patient have a NEW difficulty with getting in/out of bed, walking, or climbing stairs that is expected to last >3 days?: No Does the patient have a NEW difficulty with communication that is expected to last >3 days?: No Is the patient deaf or have difficulty hearing?: No Does the patient have difficulty seeing, even when wearing glasses/contacts?: No Does the patient have difficulty concentrating, remembering, or making decisions?: Yes  Permission Sought/Granted                  Emotional Assessment              Admission diagnosis:  Disorientation [R41.0] CAP (community acquired pneumonia) [J18.9] Acute metabolic encephalopathy [G93.41] Patient Active Problem List   Diagnosis Date Noted   Pressure injury of skin 10/31/2023   CAP (community acquired pneumonia) 10/30/2023   Acute metabolic encephalopathy 10/16/2023   Hypokalemia 10/16/2023   Acute on chronic heart failure with preserved ejection fraction (HFpEF) (HCC) 09/19/2023   Hyponatremia 08/29/2023   Dysphagia 08/29/2023   Palliative care encounter 08/29/2023   Counseling and coordination of care 08/29/2023   Goals of care, counseling/discussion 08/29/2023   Heart failure (HCC) 08/29/2023   Need for emotional support  08/29/2023   Multifocal pneumonia 08/26/2023   Normocytic anemia 08/16/2023   Precordial pain 02/21/2021   Exertional dyspnea 02/21/2021   Acute on chronic respiratory failure with hypoxia and hypercapnia (HCC) 06/27/2017   Hyperkalemia 06/27/2017   Acute renal failure superimposed on stage 2 chronic kidney disease (HCC) 06/27/2017   Restrictive lung disease 12/13/2015   Obesity hypoventilation syndrome (HCC) 05/10/2015   Acute and chronic respiratory failure with hypoxia (HCC) 05/10/2015   Acute on chronic diastolic CHF (congestive heart failure) (HCC) 03/03/2015   OSA (obstructive sleep apnea) 03/02/2015   Morbid obesity  with BMI of 50.0-59.9, adult (HCC) 10/18/2013   Lymphedema 01/26/2013   Chronic diastolic CHF (congestive heart failure) (HCC) 01/04/2013   Chronic cough    COPD (chronic obstructive pulmonary disease)Gold C    Snoring    Low back pain    Hypertension    PCP:  Pcp, No Pharmacy:   [facility] [address] [place] [address] Phone: [phone] Fax: [phone]  [facility] [place], [place] - [address] [place] [address] Phone: [phone] Fax: [phone]     Social Drivers of Health (SDOH) Social History: SDOH Screenings   Food Insecurity: No Food Insecurity (10/31/2023)  Housing: Low Risk  (10/31/2023)  Transportation Needs: Unmet Transportation Needs (10/31/2023)  Utilities: Not At Risk (10/31/2023)  Depression (PHQ2-9): Medium Risk (01/13/2019)  Social Connections: Moderately Isolated (10/31/2023)  Tobacco Use: Medium Risk (10/31/2023)   SDOH Interventions:     Readmission Risk Interventions    09/19/2023    1:52 PM 08/17/2023    9:31 AM  Readmission Risk Prevention Plan  Transportation Screening Complete Complete  PCP or Specialist Appt within 5-7 Days  Complete  PCP or Specialist Appt within 3-5 Days Complete   Home Care Screening  Complete  Medication Review (RN CM)  Complete  HRI or Home Care Consult Complete   Social Work Consult for Recovery Care Planning/Counseling Complete   Palliative Care Screening Complete   Medication Review Oceanographer) Complete

## 2023-10-31 NOTE — Progress Notes (Signed)
 PROGRESS NOTE    Morgan Odonnell  FMW:[id number] DOB: February 10, 1951 DOA: 10/30/2023 PCP: Pcp, No    Brief Narrative:  73 year old with history of morbid obesity, sleep apnea, obesity hypoventilation, chronic hypoxemic and hypercarbic respiratory failure on 4 L oxygen  at home, BiPAP at night but does not use it, chronic diastolic heart failure, COPD, hypothyroidism, bedbound status brought to the emergency room with worsening confusion.  Noncompliance to BiPAP at home.  Discharged with similar episode on 8/15.  Unable to take in care at home. In the emergency room afebrile.  Blood pressure stable.  90% on 4 L oxygen .  Creatinine 1.39.  Head CT unremarkable.  Chest x-ray poor quality with pulmonary edema.  BNP 167.  CO2 was essentially normal.  Patient was admitted due to altered mentation.  Was given antibiotics and also dose of Lasix .  Subjective: Patient seen and examined.  Husband at the bedside.  Patient was lethargic when taken off the BiPAP.  She was unable to keep up any conversation.  Looks sick.  Afebrile.  After being on BiPAP overnight, tried to titrate her to nasal cannula oxygen  however she was exhibiting some apneic episodes and put her back on BiPAP.  Husband confirms that she would not agree with BiPAP at home.   Assessment & Plan:   Acute metabolic encephalopathy: Likely multifactorial but suspect hypercapnia.  No focal deficits.  Avoid sedations.  BiPAP as below.  Mobilize with PT OT.  Acute on chronic hypoxemic and hypercapnic respiratory failure Severe sleep apnea and obesity hypoventilation syndrome noncompliant to BiPAP Acute on chronic diastolic congestive heart failure, right-sided failure.  Severe and ongoing problem. Use BiPAP nightly and as needed.  Will keep on BiPAP most of the time today.  Repeat blood gas was with normal pH and CO2. Gentle diuresis along with potassium supplement.  Suspected pneumonia: Procalcitonin 0.1.  Recent hospitalization.  Blood  cultures pending.  Difficult to determine a level of infection, however we will continue antibiotics for next 24 hours.  CKD stage IIIa: At about baseline.  Monitor.  Hypokalemia: Replace aggressively by IV and by mouth.  COPD: Nebulizer therapy.  Does not have evidence of exacerbation.  Hypothyroidism: On Synthroid .  Bilateral leg lymphedema: Chronic.  Goal of care: Chronic severe medical issues.  Not doing well despite optimal medical therapy.  Currently DNR with limited intervention.  Seen by palliative care at the previous admissions.  Given severity of symptoms and severe comorbidities patient may likely benefit with palliation or hospice at home.  Discussed this with patient's husband.  He is open to have conversation with palliative care team.  Continue supportive care now.  DVT prophylaxis: enoxaparin  (LOVENOX ) injection 40 mg Start: 10/31/23 0114   Code Status: DNR with limited intervention Family Communication: Husband at the bedside Disposition Plan: Status is: Inpatient Remains inpatient appropriate because: Severe systemic illness     Consultants:  Palliative care  Procedures:  None  Antimicrobials:  Vancomycin  Zosyn  8/21---     Objective: Vitals:   10/31/23 0833 10/31/23 0932 10/31/23 1032 10/31/23 1120  BP: 99/66  (!) 123/106   Pulse: 79 79 73 77  Resp: 19 20 14 15   Temp:      TempSrc:      SpO2: 95% 91% 95% 90%  Weight:      Height:        Intake/Output Summary (Last 24 hours) at 10/31/2023 1132 Last data filed at 10/31/2023 0600 Gross per 24 hour  Intake 561 ml  Output  750 ml  Net -189 ml   Filed Weights   10/31/23 0107  Weight: 114.4 kg    Examination:  General exam: Chronically sick looking.  Frail and debilitated.  Lethargic. Respiratory system: Poor inspiratory effort.  On BiPAP.  Poor bilateral air entry.  No added sounds. Cardiovascular system: S1 & S2 heard, RRR.  Gastrointestinal system: Soft.  Nontender.  Bowel sound present.   Obese and pendulous. Central nervous system: Alert on stimulation.  Hardly follows any commands.  Gross generalized weakness. Extremities: Gross generalized weakness. Extensive lymphedematous changes and skin changes mostly chronic.    Data Reviewed: I have personally reviewed following labs and imaging studies  CBC: Recent Labs  Lab 10/30/23 2045 10/30/23 2058 10/31/23 0011 10/31/23 0521  WBC 9.7  --   --  9.8  NEUTROABS 6.6  --   --   --   HGB 12.6 13.6 11.2* 11.3*  HCT 40.0 40.0 33.0* 35.1*  MCV 89.3  --   --  85.0  PLT 252  --   --  255   Basic Metabolic Panel: Recent Labs  Lab 10/30/23 2045 10/30/23 2058 10/31/23 0011 10/31/23 0521  NA 136 137 137 137  K 3.5 3.7 2.6* 2.7*  CL 97*  --   --  100  CO2 27  --   --  27  GLUCOSE 111*  --   --  83  BUN 24*  --   --  22  CREATININE 1.39*  --   --  1.29*  CALCIUM  9.9  --   --  9.3  MG 1.8  --   --  1.7   GFR: Estimated Creatinine Clearance: 47.9 mL/min (A) (by C-G formula based on SCr of 1.29 mg/dL (H)). Liver Function Tests: Recent Labs  Lab 10/30/23 2045 10/31/23 0521  AST 39 29  ALT 19 16  ALKPHOS 91 75  BILITOT 1.4* 1.6*  PROT 8.2* 7.3  ALBUMIN 2.6* 2.2*   No results for input(s): LIPASE, AMYLASE in the last 168 hours. No results for input(s): AMMONIA in the last 168 hours. Coagulation Profile: No results for input(s): INR, PROTIME in the last 168 hours. Cardiac Enzymes: Recent Labs  Lab 10/30/23 2045  CKTOTAL 40   BNP (last 3 results) No results for input(s): PROBNP in the last 8760 hours. HbA1C: No results for input(s): HGBA1C in the last 72 hours. CBG: No results for input(s): GLUCAP in the last 168 hours. Lipid Profile: No results for input(s): CHOL, HDL, LDLCALC, TRIG, CHOLHDL, LDLDIRECT in the last 72 hours. Thyroid  Function Tests: Recent Labs    10/30/23 2045 10/31/23 0521  TSH 9.677* 7.484*  FREET4 1.42*  --    Anemia Panel: No results for input(s):  VITAMINB12, FOLATE, FERRITIN, TIBC, IRON, RETICCTPCT in the last 72 hours. Sepsis Labs: Recent Labs  Lab 10/30/23 2103 10/31/23 0521  PROCALCITON  --  0.10  LATICACIDVEN 1.7  --     Recent Results (from the past 240 hours)  Blood culture (routine x 2)     Status: None (Preliminary result)   Collection Time: 10/30/23  8:59 PM   Specimen: BLOOD  Result Value Ref Range Status   Specimen Description BLOOD RIGHT ANTECUBITAL  Final   Special Requests   Final    BOTTLES DRAWN AEROBIC AND ANAEROBIC Blood Culture results may not be optimal due to an inadequate volume of blood received in culture bottles   Culture   Final    NO GROWTH < 12 HOURS Performed  at [facility], [address] [place], [place] [address]    Report Status PENDING  Incomplete  Blood culture (routine x 2)     Status: None (Preliminary result)   Collection Time: 10/30/23  8:59 PM   Specimen: BLOOD  Result Value Ref Range Status   Specimen Description BLOOD LEFT ANTECUBITAL  Final   Special Requests   Final    BOTTLES DRAWN AEROBIC AND ANAEROBIC Blood Culture results may not be optimal due to an inadequate volume of blood received in culture bottles   Culture   Final    NO GROWTH < 12 HOURS Performed at [facility], [address], [place], [place] [address]    Report Status PENDING  Incomplete         Radiology Studies: CT Head Wo Contrast Result Date: 10/30/2023 CLINICAL DATA:  Altered mental status EXAM: CT HEAD WITHOUT CONTRAST TECHNIQUE: Contiguous axial images were obtained from the base of the skull through the vertex without intravenous contrast. RADIATION DOSE REDUCTION: This exam was performed according to the departmental dose-optimization program which includes automated exposure control, adjustment of the mA and/or kV according to patient size and/or use of iterative reconstruction technique. COMPARISON:  None Available. FINDINGS: Brain: There is atrophy and chronic small  vessel disease changes. No acute intracranial abnormality. Specifically, no hemorrhage, hydrocephalus, mass lesion, acute infarction, or significant intracranial injury. Vascular: No hyperdense vessel or unexpected calcification. Skull: No acute calvarial abnormality. Sinuses/Orbits: No acute findings Other: None IMPRESSION: Atrophy, chronic microvascular disease. No acute intracranial abnormality. Electronically Signed   By: Franky Crease M.D.   On: 10/30/2023 22:32   DG Chest Portable 1 View Result Date: 10/30/2023 CLINICAL DATA:  Reported hypoxia and altered mental status EXAM: PORTABLE CHEST 1 VIEW COMPARISON:  10/16/2023 FINDINGS: Stable cardiomegaly. Aortic atherosclerotic calcification. Pulmonary vascular congestion. Diffuse interstitial coarsening. Bi basilar airspace opacities similar to prior. No definite pleural effusion. No pneumothorax. IMPRESSION: Findings favor CHF with pulmonary edema. Multifocal pneumonia could appear similarly. Electronically Signed   By: Norman Gatlin M.D.   On: 10/30/2023 21:20        Scheduled Meds:  aspirin  EC  81 mg Oral Daily   baclofen   10 mg Oral TID   cholecalciferol   5,000 Units Oral Daily   citalopram   20 mg Oral Daily   diclofenac  Sodium  2 g Topical QID   enoxaparin  (LOVENOX ) injection  40 mg Subcutaneous Q24H   fluticasone  furoate-vilanterol  1 puff Inhalation Daily   levothyroxine   200 mcg Oral QAC breakfast   Methylnaltrexone  Bromide  3 tablet Oral Daily   naloxegol  oxalate  25 mg Oral Daily   spironolactone   25 mg Oral Daily   umeclidinium bromide   1 puff Inhalation Daily   Continuous Infusions:  piperacillin -tazobactam (ZOSYN )  IV 12.5 mL/hr at 10/31/23 0600   potassium chloride  10 mEq (10/31/23 1116)   [START ON 11/01/2023] vancomycin        LOS: 0 days    Time spent: 55 minutes    Renato Applebaum, MD [facility]

## 2023-11-01 DIAGNOSIS — Z66 Do not resuscitate: Secondary | ICD-10-CM | POA: Diagnosis not present

## 2023-11-01 DIAGNOSIS — Z515 Encounter for palliative care: Secondary | ICD-10-CM | POA: Diagnosis not present

## 2023-11-01 DIAGNOSIS — J189 Pneumonia, unspecified organism: Secondary | ICD-10-CM | POA: Diagnosis not present

## 2023-11-01 DIAGNOSIS — Z7189 Other specified counseling: Secondary | ICD-10-CM | POA: Diagnosis not present

## 2023-11-01 LAB — CBC WITH DIFFERENTIAL/PLATELET
Abs Immature Granulocytes: 0.02 K/uL (ref 0.00–0.07)
Basophils Absolute: 0.1 K/uL (ref 0.0–0.1)
Basophils Relative: 1 %
Eosinophils Absolute: 0.4 K/uL (ref 0.0–0.5)
Eosinophils Relative: 4 %
HCT: 31.8 % — ABNORMAL LOW (ref 36.0–46.0)
Hemoglobin: 10.3 g/dL — ABNORMAL LOW (ref 12.0–15.0)
Immature Granulocytes: 0 %
Lymphocytes Relative: 26 %
Lymphs Abs: 2.2 K/uL (ref 0.7–4.0)
MCH: 27.8 pg (ref 26.0–34.0)
MCHC: 32.4 g/dL (ref 30.0–36.0)
MCV: 85.9 fL (ref 80.0–100.0)
Monocytes Absolute: 1 K/uL (ref 0.1–1.0)
Monocytes Relative: 12 %
Neutro Abs: 5 K/uL (ref 1.7–7.7)
Neutrophils Relative %: 57 %
Platelets: 257 K/uL (ref 150–400)
RBC: 3.7 MIL/uL — ABNORMAL LOW (ref 3.87–5.11)
RDW: 16.8 % — ABNORMAL HIGH (ref 11.5–15.5)
WBC: 8.7 K/uL (ref 4.0–10.5)
nRBC: 0 % (ref 0.0–0.2)

## 2023-11-01 LAB — URINE CULTURE: Culture: >=100000 — AB

## 2023-11-01 LAB — BASIC METABOLIC PANEL WITH GFR
Anion gap: 4 — ABNORMAL LOW (ref 5–15)
BUN: 20 mg/dL (ref 8–23)
CO2: 27 mmol/L (ref 22–32)
Calcium: 8.5 mg/dL — ABNORMAL LOW (ref 8.9–10.3)
Chloride: 104 mmol/L (ref 98–111)
Creatinine, Ser: 1.36 mg/dL — ABNORMAL HIGH (ref 0.44–1.00)
GFR, Estimated: 42 mL/min — ABNORMAL LOW (ref >60)
Glucose, Bld: 78 mg/dL (ref 70–99)
Potassium: 3.5 mmol/L (ref 3.5–5.1)
Sodium: 135 mmol/L (ref 135–145)

## 2023-11-01 LAB — MAGNESIUM: Magnesium: 1.7 mg/dL (ref 1.7–2.4)

## 2023-11-01 MED ORDER — GERHARDT'S BUTT CREAM
TOPICAL_CREAM | Freq: Two times a day (BID) | CUTANEOUS | Status: DC
  Administered 2023-11-06: 1 via TOPICAL
  Filled 2023-11-01: qty 60

## 2023-11-01 NOTE — Progress Notes (Signed)
 Stool sample collected and sent to lab.

## 2023-11-01 NOTE — Consult Note (Signed)
 WOC Nurse Consult Note: Reason for Consult: legs/ buttocks Patient known to the WOC nursing team, lymphedema since teenager.  Admitted for AMS, she is BiPAP dependent.  Wound type: Full thickness wound right thigh; unclear etiology Excoriation posterior thighs; scratch marks Right great toe; red Healed wound on the right LE Skin tear UEs Pressure Injury POA: NA Measurement:see nursing flow sheet Wound azi:nezw areas are clean; pink Drainage (amount, consistency, odor) see nursing flow sheets Periwound: intact Hemosiderin staining bilateral LEs; venous stasis dermatitis  Dressing procedure/placement/frequency Cleanse arm wounds and right thigh wound with saline, pat dry  Cover each area with single layer of xeroform gauze, top with foam  Change daily   Apply Gerhardt's to the posterior thigh wounds daily    Re consult if needed, will not follow at this time. Thanks  Anayia Eugene [employer], RN,CWOCN, CNS, [employer] [phone]

## 2023-11-01 NOTE — Progress Notes (Signed)
   10/31/23 1935  Assess: MEWS Score  Temp (!) 97.3 F (36.3 C)  BP (!) 94/59  MAP (mmHg) 70  Pulse Rate 71  ECG Heart Rate 70  Resp 16  Level of Consciousness Responds to Voice  SpO2 96 %  O2 Device Bi-PAP  O2 Flow Rate (L/min) 6 L/min  Assess: MEWS Score  MEWS Temp 0  MEWS Systolic 1  MEWS Pulse 0  MEWS RR 0  MEWS LOC 1  MEWS Score 2  MEWS Score Color Yellow  Assess: if the MEWS score is Yellow or Red  Were vital signs accurate and taken at a resting state? Yes  Does the patient meet 2 or more of the SIRS criteria? No  MEWS guidelines implemented  Yes, yellow  Treat  MEWS Interventions Considered administering scheduled or prn medications/treatments as ordered  Take Vital Signs  Increase Vital Sign Frequency  Yellow: Q2hr x1, continue Q4hrs until patient remains green for 12hrs  Escalate  MEWS: Escalate Yellow: Discuss with charge nurse and consider notifying provider and/or RRT  Notify: Charge Nurse/RN  Name of Charge Nurse/RN Notified Wells Fabry, RN  Assess: SIRS CRITERIA  SIRS Temperature  0  SIRS Respirations  0  SIRS Pulse 0  SIRS WBC 0  SIRS Score Sum  0   MEWS protocol implemented, will continue to monitor.

## 2023-11-01 NOTE — Consult Note (Signed)
 Palliative Care Consult Note                                  Date: 11/01/2023   Patient Name: Morgan Odonnell  DOB: Jul 31, 1950  MRN: [id number]  Age / Sex: 73 y.o., female  PCP: Pcp, No Referring Physician: Lue Elsie BROCKS, MD  Reason for Consultation: Establishing goals of care  HPI/Patient Profile: Palliative Care consult requested for goals of care discussion in this 73 y.o. female  with past medical history of chronic hypoxic and hypercarbic respiratory failure (4 L home oxygen ), nocturnal BiPAP however patient is not using, diastolic heart failure, hypothyroidism, COPD, obesity, sleep apnea, bedbound.  She was admitted on 10/30/2023 from home with worsening confusion.  Patient recently discharged on 8/15 for similar episode.  Chest x-ray showed pulmonary edema.  Requiring BiPAP.  Past Medical History:  Diagnosis Date   Allergies    Anemia    Back pain    Bilateral swelling of feet    CHF (congestive heart failure) (HCC)    Chronic cough    COPD (chronic obstructive pulmonary disease) (HCC)    Depression    GERD (gastroesophageal reflux disease)    Heart murmur    Hypertension    Hypocapnia    Hypothyroid    Knee pain    Leg edema    chronic, bilateral   Lightheaded    Low back pain    Morbid obesity (HCC)    Neck pain    Obesity hypoventilation syndrome (HCC)    Oral lesion    Shortness of breath    Shortness of breath    Shoulder pain    Snoring      Subjective:   This NP Levon Freud reviewed medical records, received report from team, assessed the patient and then met at the patient's bedside with patient and her husband, [name] Croissant to discuss diagnosis, prognosis, GOC, EOL wishes disposition and options.  Mrs. Hutsman unable to participate in discussions. She is resting on BiPaP. Awakens throughout visit however somnolent. Unable to determine mentation. Husband reports some confusion but feels this  has improved since patient has been utilizing BiPAP around the clock.    Concept of Palliative Care was introduced as specialized medical care for people and their families living with serious illness.  It focuses on providing relief from the symptoms and stress of a serious illness.  The goal is to improve quality of life for both the patient and the family. Values and goals of care important to patient and family were attempted to be elicited. Of note patient was seen by our palliative team during previous visit.   I created space and opportunity for Mr. Chamorro to explore state of health prior to admission, thoughts, and feelings.   Mr. Remsburg shares he and his wife have been married for over 50 years.  Patient has 1 child from a previous relationship and they have 2 children together.  Husband reports patient has been in and out of rehabilitation facilities due to overall health decline.  He states he prior to 6 months ago he felt like her quality of life was okay. Her quality of life has declined over the past four to five months. She is mostly bed-bound or recliner-bound and has experienced significant weight loss. Her appetite fluctuates.  There are also episodes of confusion, particularly when not using the BiPAP, which may be related to  oxygenation issues. He is emotional expressing her significant health decline and deterioration in quality of life.  She has been experiencing chronic respiratory issues, which have required hospitalization and subsequent transfer to a rehab facility. She was previously hospitalized in June or early July and was referred for outpatient palliative care with [facility], though it is unclear if follow-up occurred.  Mr. Ovando reports improvement with the use of a BiPAP machine, which she has been using more consistently since her current hospitalization. However, there are concerns about the adequacy of resources at home, including the need for a filter change on  the home oxygen  machine. He shares patient has been very reluctant to use in the home however he feels she may be more apt to using now knowing the significance and impact on her health.  We discussed Her current illness and what it means in the larger context of Her on-going co-morbidities. Natural disease trajectory and expectations were discussed.  Mr. Ebel verbalized understanding of current illness and co-morbidities. Her husband expresses concerns about her care needs outside the hospital and is unsure about the next steps. He notes that their daughter is local, [place], indicating some family support is available locally.  I empathetically approach discussions regarding advance care planning and healthcare limitations.  Husband confirms DNR/DNI stating he is open to all levels of care open to this point.  Patient has a documented advanced directive on file.  Patient's son [name] and daughter Stephens are her documented medical decision makers.  Husband confirms stating he is emotionally drained and unsure the best way to proceed in his wife's care.    He states family has been in discussions and are clear and expressed wishes at this time to continue to treat the treatable allow patient every opportunity to show signs of stability or improvement.   has been reports goal is for patient to discharge to [name] facility for ongoing care.  They are open to outpatient palliative support at this has not been established from previous visit. They are not interested in comfort focused care or hospice. I expressed concerns of patient's worsening health and inability to show signs of significant improvement encouraging ongoing discussions as time may be coming to consider quality of life and inability to return home without full support. Family verbalized understanding.   I discussed the importance of continued conversation with family and their medical providers regarding  overall plan of care and treatment options, ensuring decisions are within the context of the patients values and GOCs.  Questions and concerns were addressed. The family was encouraged to call with questions or concerns.  PMT will continue to support holistically as needed.  Objective:   Primary Diagnoses: Present on Admission:  CAP (community acquired pneumonia)  Acute metabolic encephalopathy   Scheduled Meds:  aspirin  EC  81 mg Oral Daily   baclofen   10 mg Oral TID   cholecalciferol   5,000 Units Oral Daily   citalopram   20 mg Oral Daily   diclofenac  Sodium  2 g Topical QID   enoxaparin  (LOVENOX ) injection  40 mg Subcutaneous Q24H   fluticasone  furoate-vilanterol  1 puff Inhalation Daily   levothyroxine   200 mcg Oral QAC breakfast   Methylnaltrexone  Bromide  3 tablet Oral Daily   naloxegol  oxalate  25 mg Oral Daily   spironolactone   25 mg Oral Daily   umeclidinium bromide   1 puff Inhalation Daily    Continuous Infusions:  piperacillin -tazobactam (ZOSYN )  IV 12.5 mL/hr  at 11/01/23 0700   vancomycin       PRN Meds: acetaminophen  **OR** acetaminophen , ipratropium-albuterol , ondansetron  **OR** ondansetron  (ZOFRAN ) IV, polyethylene glycol  Allergies  Allergen Reactions   Gabapentin Other (See Comments)    slept for 2 days   Naloxone  Other (See Comments)    Allergy not listed on MAR    Novocain [Procaine] Other (See Comments)    intolerance    Review of Systems  Unable to perform ROS: Acuity of condition   Unless otherwise noted, a complete review of systems is negative.  Physical Exam General: NAD, frail chronically-ill appearing Cardiovascular: regular rate and rhythm, hypotensive Pulmonary: BiPAP in place, diminished bilaterally  Abdomen: soft, nontender, obese, + bowel sounds Extremities: generalized weakness, anasarca Skin: no rashes, warm and dry, scattered ecchymosis and chronic skin changes Neurological: Unable to assess  Vital Signs:  BP 114/61 (BP  Location: Right Wrist)   Pulse 85   Temp 99.3 F (37.4 C) (Oral)   Resp 18   Ht 5' 2 (1.575 m)   Wt 114.4 kg   SpO2 95%   BMI 46.13 kg/m  Pain Scale: 0-10   Pain Score: 0-No pain  SpO2: SpO2: 95 % O2 Device:SpO2: 95 % O2 Flow Rate: .O2 Flow Rate (L/min): 6 L/min  IO: Intake/output summary:  Intake/Output Summary (Last 24 hours) at 11/01/2023 1039 Last data filed at 11/01/2023 0805 Gross per 24 hour  Intake 930.18 ml  Output 1000 ml  Net -69.82 ml    LBM: Last BM Date : 11/01/23 Baseline Weight: Weight: 114.4 kg Most recent weight: Weight: 114.4 kg      Palliative Assessment/Data:    Advanced Care Planning:   Primary Decision Maker: HCPOA  Code Status/Advance Care Planning: DNR  A discussion was had today regarding advanced directives. Concepts specific to code status, artifical feeding and hydration, continued IV antibiotics and rehospitalization was had.  The difference between a aggressive medical intervention path and a palliative comfort care path was discussed. See discussions above.   Hospice and Palliative Care services outpatient were explained and offered. Patient and family verbalized their understanding and awareness of both palliative and hospice's goals and philosophy of care.   Family in agreement with outpatient palliative. This was also requested at discharged from last hospitalization however husband unsure if patient received follow-up.   Assessment & Plan:   SUMMARY OF RECOMMENDATIONS   DNR/DNI-as confirmed by husband/family Continue with current plan of care. Treat the treatable allowing every opportunity to thrive . Understands concerns for decline in health and decreased/poor quality of life. Encouraged ongoing goals of care discussions.  Husband expressed family is not interested in considering comfort focused care at this time or hospice.  Is open to outpatient palliative support.  Clear and expressed wishes to continue to treat the treatable  allowing patient every opportunity to continue to have time to show improvement or stability.  Goal is for patient to discharge to [name] when appropriate.  Outpatient palliative referral. Order placed during previous admission. Unsure if she is currently active under their services.  PMT will continue to support and follow as needed. Please call team line or secure chat covering palliative providers with with urgent unmet palliative needs.  Symptom Management:  Per Attending  Palliative Prophylaxis:  Aspiration, Bowel Regimen, Delirium Protocol, Frequent Pain Assessment, Oral Care, Palliative Wound Care, and Turn Reposition  Additional Recommendations (Limitations, Scope, Preferences): DNR/DNI, continue to treat the treatable   Psycho-social/Spiritual:  Desire for further Chaplaincy support: no Additional  Recommendations: Education on Hospice  Prognosis:  Guarded-Poor   Discharge Planning:  Skilled Nursing Facility for rehab with Palliative care service follow-up   Patient's husband expressed understanding and was in agreement with this plan.   Time Total: 75 min   Visit consisted of counseling and education dealing with the complex and emotionally intense issues of symptom management and palliative care in the setting of serious and potentially life-threatening illness.  Signed by:  Levon Borer, AGPCNP-BC Pall[facility]   Phone: [phone] Pager: [phone] Amion: GEANNIE Freud   Thank you for allowing the Palliative Medicine Team to assist in the care of this patient. Please utilize secure chat with additional questions, if there is no response within 30 minutes please call the above phone number. Palliative Medicine Team providers are available by phone from 7am to 5pm daily and can be reached through the team cell phone.  Should this patient require assistance outside of these hours, please call the  patient's attending physician.  *Please note that this is a verbal dictation therefore any spelling or grammatical errors are due to the Dragon [employer] One system interpretation.

## 2023-11-01 NOTE — TOC Progression Note (Addendum)
 Transition of Care [facility]) - Progression Note    Patient Details  Name: LADONNE SHARPLES MRN: [id number] Date of Birth: 1950-10-21  Transition of Care [facility]) CM/SW Contact  Isaiah Public, LCSWA Phone Number: 11/01/2023, 11:26 AM  Clinical Narrative:     CSW LVM for patients son [name]. CSW awaiting call back to discuss SNF bed offers. CSW will continue to follow.  Update- CSW received call back from patients son [name]. CSW provided patients son with SNF bed offers. Patients son accepted SNF bed offer with [facility]. Jon with [facility] confirmed SNF bed for patient. CSW informed Jon that patients son informed CSW that patients spouse will bring Bipap from home to facility. CSW following to start insurance authorization closer to patient being medically ready.  Expected Discharge Plan: Skilled Nursing Facility Barriers to Discharge: Continued Medical Work up               Expected Discharge Plan and Services In-house Referral: Clinical Social Work     Living arrangements for the past 2 months: Single Family Home                                       Social Drivers of Health (SDOH) Interventions SDOH Screenings   Food Insecurity: No Food Insecurity (10/31/2023)  Housing: Low Risk  (10/31/2023)  Transportation Needs: Unmet Transportation Needs (10/31/2023)  Utilities: Not At Risk (10/31/2023)  Depression (PHQ2-9): Medium Risk (01/13/2019)  Social Connections: Moderately Isolated (10/31/2023)  Tobacco Use: Medium Risk (10/31/2023)    Readmission Risk Interventions    09/19/2023    1:52 PM 08/17/2023    9:31 AM  Readmission Risk Prevention Plan  Transportation Screening Complete Complete  PCP or Specialist Appt within 5-7 Days  Complete  PCP or Specialist Appt within 3-5 Days Complete   Home Care Screening  Complete  Medication Review (RN CM)  Complete  HRI or Home Care Consult Complete   Social Work Consult for Recovery Care Planning/Counseling  Complete   Palliative Care Screening Complete   Medication Review Oceanographer) Complete

## 2023-11-01 NOTE — Progress Notes (Addendum)
 PROGRESS NOTE    Morgan Odonnell  FMW:[id number] DOB: [birth year] DOA: 10/30/2023 PCP: Pcp, No   Brief Narrative:  73 year old with history of morbid obesity, sleep apnea, obesity hypoventilation, chronic hypoxemic and hypercarbic respiratory failure on 4 L oxygen  at home, BiPAP at night but does not use it, chronic diastolic heart failure, COPD, hypothyroidism, bedbound status brought to the emergency room with worsening confusion.  Noncompliance to BiPAP at home.  Discharged with similar episode on 8/15 but family unable to care for patient at home given worsening confusion in the setting of noncompliance.  Assessment & Plan:   Principal Problem:   CAP (community acquired pneumonia) Active Problems:   Acute metabolic encephalopathy   Pressure injury of skin   Acute metabolic encephalopathy Acute on chronic hypoxic/hypercarbic respiratory failure Complicated by severe sleep apnea and obesity hypoventilation -Secondary to non-compliance -Continue supportive care - now compliant/tolerating BiPAP overnight/during the day  Acute on chronic diastolic congestive heart failure, right-sided failure. - Not in acute exacerbation - Continue PRN diuretics    Suspected pneumonia - Continue empiric coverage with Zosyn , vancomycin  given recent hospitalization and baseline respiratory status  - High risk for aspiration given above mental status changes - Consider transitioning to p.o. antibiotics in the next 24 hours -Patient does not meet sepsis criteria   CKD stage IIIa  Hypokalemia  - Renal function near baseline  - Continue to advance diet as tolerated, avoid IV fluids given heart failure history -Potassium replaced, continue to follow currently within normal limits   COPD without acute exacerbation: Continue nebulizer/inhaler therapy  Hypothyroidism: On Synthroid . Bilateral leg lymphedema: Chronic.  Goal of care: Discussed with patient and husband at bedside at length today  given recurrent hospitalizations worsening mental status and noncompliance patient remains a remarkably high risk for decompensation. We discussed that patient is DNR thus she has made some decisions about her goals of care in the past.  Appreciate palliative care insight and recommendations as we move forward, patient still hopeful for recovery and will likely dispo to SNF per family discussion but ultimately would like to end up at home with family.  Body mass index is 46.13 kg/m.   DVT prophylaxis: enoxaparin  (LOVENOX ) injection 40 mg Start: 10/31/23 0114 Code Status:   Code Status: Limited: Do not attempt resuscitation (DNR) -DNR-LIMITED -Do Not Intubate/DNI  Family Communication: Husband at bedside, son on the phone  Status is: Inpatient  Dispo: The patient is from: Home              Anticipated d/c is to: SNF              Anticipated d/c date is: 4872 hours              Patient currently not medically stable for discharge  Consultants:  Palliative care  Procedures:  None  Antimicrobials:  Vent, Zosyn   Subjective: Noted episode of diarrhea overnight but no stool in the rectal tube, review of systems markedly limited given patient's mental status but husband indicates she appears improved compared to admission  Objective: Vitals:   10/31/23 2330 11/01/23 0035 11/01/23 0435 11/01/23 0635  BP:  116/68 106/73 101/68  Pulse: 79 75 74 84  Resp: 20 17 17 20   Temp: 97.7 F (36.5 C)  97.7 F (36.5 C)   TempSrc: Oral  Oral   SpO2: 95% 94% 99% 100%  Weight:      Height:        Intake/Output Summary (Last 24 hours) at 11/01/2023  9263 Last data filed at 11/01/2023 0700 Gross per 24 hour  Intake 930.18 ml  Output 500 ml  Net 430.18 ml   Filed Weights   10/31/23 0107  Weight: 114.4 kg    Examination:  General: Appears unwell but not in acute distress Neck: Obese, landmarks not visualized. Lungs: Distant with poor inspiratory effort on BiPAP no clear rhonchi but  somewhat obscured by upper respiratory noises from BiPAP machine Heart:  Regular rate and rhythm.  Without murmurs, rubs, or gallops. Abdomen: Obese, nontender. Extremities: Profound lymphedema changes without clear pitting edema.  Data Reviewed: I have personally reviewed following labs and imaging studies  CBC: Recent Labs  Lab 10/30/23 2045 10/30/23 2058 10/31/23 0011 10/31/23 0521 11/01/23 0508  WBC 9.7  --   --  9.8 8.7  NEUTROABS 6.6  --   --   --  5.0  HGB 12.6 13.6 11.2* 11.3* 10.3*  HCT 40.0 40.0 33.0* 35.1* 31.8*  MCV 89.3  --   --  85.0 85.9  PLT 252  --   --  255 257   Basic Metabolic Panel: Recent Labs  Lab 10/30/23 2045 10/30/23 2058 10/31/23 0011 10/31/23 0521 10/31/23 2225 11/01/23 0508  NA 136 137 137 137 135 135  K 3.5 3.7 2.6* 2.7* 4.1 3.5  CL 97*  --   --  100 100 104  CO2 27  --   --  27 26 27   GLUCOSE 111*  --   --  83 82 78  BUN 24*  --   --  22 23 20   CREATININE 1.39*  --   --  1.29* 1.37* 1.36*  CALCIUM  9.9  --   --  9.3 8.8* 8.5*  MG 1.8  --   --  1.7  --  1.7   GFR: Estimated Creatinine Clearance: 45.4 mL/min (A) (by C-G formula based on SCr of 1.36 mg/dL (H)). Liver Function Tests: Recent Labs  Lab 10/30/23 2045 10/31/23 0521  AST 39 29  ALT 19 16  ALKPHOS 91 75  BILITOT 1.4* 1.6*  PROT 8.2* 7.3  ALBUMIN 2.6* 2.2*   No results for input(s): LIPASE, AMYLASE in the last 168 hours. No results for input(s): AMMONIA in the last 168 hours. Coagulation Profile: No results for input(s): INR, PROTIME in the last 168 hours. Cardiac Enzymes: Recent Labs  Lab 10/30/23 2045  CKTOTAL 40   BNP (last 3 results) No results for input(s): PROBNP in the last 8760 hours. HbA1C: No results for input(s): HGBA1C in the last 72 hours. CBG: No results for input(s): GLUCAP in the last 168 hours. Lipid Profile: No results for input(s): CHOL, HDL, LDLCALC, TRIG, CHOLHDL, LDLDIRECT in the last 72 hours. Thyroid   Function Tests: Recent Labs    10/30/23 2045 10/31/23 0521  TSH 9.677* 7.484*  FREET4 1.42*  --    Anemia Panel: No results for input(s): VITAMINB12, FOLATE, FERRITIN, TIBC, IRON, RETICCTPCT in the last 72 hours. Sepsis Labs: Recent Labs  Lab 10/30/23 2103 10/31/23 0521  PROCALCITON  --  0.10  LATICACIDVEN 1.7  --     Recent Results (from the past 240 hours)  Blood culture (routine x 2)     Status: None (Preliminary result)   Collection Time: 10/30/23  8:59 PM   Specimen: BLOOD  Result Value Ref Range Status   Specimen Description BLOOD RIGHT ANTECUBITAL  Final   Special Requests   Final    BOTTLES DRAWN AEROBIC AND ANAEROBIC Blood Culture results may not  be optimal due to an inadequate volume of blood received in culture bottles   Culture   Final    NO GROWTH < 12 HOURS Performed at [facility], [address], [place], [place] [address]    Report Status PENDING  Incomplete  Blood culture (routine x 2)     Status: None (Preliminary result)   Collection Time: 10/30/23  8:59 PM   Specimen: BLOOD  Result Value Ref Range Status   Specimen Description BLOOD LEFT ANTECUBITAL  Final   Special Requests   Final    BOTTLES DRAWN AEROBIC AND ANAEROBIC Blood Culture results may not be optimal due to an inadequate volume of blood received in culture bottles   Culture   Final    NO GROWTH < 12 HOURS Performed at [facility], [address] [place], [place] [address]    Report Status PENDING  Incomplete  Urine Culture     Status: Abnormal (Preliminary result)   Collection Time: 10/30/23 11:40 PM   Specimen: Urine, Catheterized  Result Value Ref Range Status   Specimen Description URINE, CATHETERIZED  Final   Special Requests NONE  Final   Culture (A)  Final    >=100,000 COLONIES/mL GRAM POSITIVE COCCI IDENTIFICATION AND SUSCEPTIBILITIES TO FOLLOW Performed at [facility], [address], [place], [place] [address]    Report Status PENDING   Incomplete         Radiology Studies: CT Head Wo Contrast Result Date: 10/30/2023 CLINICAL DATA:  Altered mental status EXAM: CT HEAD WITHOUT CONTRAST TECHNIQUE: Contiguous axial images were obtained from the base of the skull through the vertex without intravenous contrast. RADIATION DOSE REDUCTION: This exam was performed according to the departmental dose-optimization program which includes automated exposure control, adjustment of the mA and/or kV according to patient size and/or use of iterative reconstruction technique. COMPARISON:  None Available. FINDINGS: Brain: There is atrophy and chronic small vessel disease changes. No acute intracranial abnormality. Specifically, no hemorrhage, hydrocephalus, mass lesion, acute infarction, or significant intracranial injury. Vascular: No hyperdense vessel or unexpected calcification. Skull: No acute calvarial abnormality. Sinuses/Orbits: No acute findings Other: None IMPRESSION: Atrophy, chronic microvascular disease. No acute intracranial abnormality. Electronically Signed   By: Franky Crease M.D.   On: 10/30/2023 22:32   DG Chest Portable 1 View Result Date: 10/30/2023 CLINICAL DATA:  Reported hypoxia and altered mental status EXAM: PORTABLE CHEST 1 VIEW COMPARISON:  10/16/2023 FINDINGS: Stable cardiomegaly. Aortic atherosclerotic calcification. Pulmonary vascular congestion. Diffuse interstitial coarsening. Bi basilar airspace opacities similar to prior. No definite pleural effusion. No pneumothorax. IMPRESSION: Findings favor CHF with pulmonary edema. Multifocal pneumonia could appear similarly. Electronically Signed   By: Norman Gatlin M.D.   On: 10/30/2023 21:20        Scheduled Meds:  aspirin  EC  81 mg Oral Daily   baclofen   10 mg Oral TID   cholecalciferol   5,000 Units Oral Daily   citalopram   20 mg Oral Daily   diclofenac  Sodium  2 g Topical QID   enoxaparin  (LOVENOX ) injection  40 mg Subcutaneous Q24H   fluticasone   furoate-vilanterol  1 puff Inhalation Daily   levothyroxine   200 mcg Oral QAC breakfast   Methylnaltrexone  Bromide  3 tablet Oral Daily   naloxegol  oxalate  25 mg Oral Daily   spironolactone   25 mg Oral Daily   umeclidinium bromide   1 puff Inhalation Daily   Continuous Infusions:  piperacillin -tazobactam (ZOSYN )  IV 12.5 mL/hr at 11/01/23 0700  vancomycin        LOS: 1 day   Time spent:  Elsie JAYSON Montclair, DO [facility]  If 7PM-7AM, please contact night-coverage [email or web address]  11/01/2023, 7:36 AM

## 2023-11-02 DIAGNOSIS — J189 Pneumonia, unspecified organism: Secondary | ICD-10-CM | POA: Diagnosis not present

## 2023-11-02 DIAGNOSIS — Z7189 Other specified counseling: Secondary | ICD-10-CM | POA: Diagnosis not present

## 2023-11-02 DIAGNOSIS — Z515 Encounter for palliative care: Secondary | ICD-10-CM | POA: Diagnosis not present

## 2023-11-02 LAB — GASTROINTESTINAL PANEL BY PCR, STOOL (REPLACES STOOL CULTURE)

## 2023-11-02 LAB — CBC
HCT: 33 % — ABNORMAL LOW (ref 36.0–46.0)
Hemoglobin: 10.4 g/dL — ABNORMAL LOW (ref 12.0–15.0)
MCH: 27.4 pg (ref 26.0–34.0)
MCHC: 31.5 g/dL (ref 30.0–36.0)
MCV: 86.8 fL (ref 80.0–100.0)
Platelets: 265 K/uL (ref 150–400)
RBC: 3.8 MIL/uL — ABNORMAL LOW (ref 3.87–5.11)
RDW: 16.8 % — ABNORMAL HIGH (ref 11.5–15.5)
WBC: 9.1 K/uL (ref 4.0–10.5)
nRBC: 0 % (ref 0.0–0.2)

## 2023-11-02 LAB — BASIC METABOLIC PANEL WITH GFR
Anion gap: 8 (ref 5–15)
BUN: 14 mg/dL (ref 8–23)
CO2: 27 mmol/L (ref 22–32)
Calcium: 8.8 mg/dL — ABNORMAL LOW (ref 8.9–10.3)
Chloride: 102 mmol/L (ref 98–111)
Creatinine, Ser: 1.59 mg/dL — ABNORMAL HIGH (ref 0.44–1.00)
GFR, Estimated: 35 mL/min — ABNORMAL LOW (ref >60)
Glucose, Bld: 88 mg/dL (ref 70–99)
Potassium: 3.1 mmol/L — ABNORMAL LOW (ref 3.5–5.1)
Sodium: 137 mmol/L (ref 135–145)

## 2023-11-02 MED ORDER — POTASSIUM CHLORIDE CRYS ER 20 MEQ PO TBCR
40.0000 meq | EXTENDED_RELEASE_TABLET | Freq: Once | ORAL | Status: AC
  Administered 2023-11-02: 40 meq via ORAL
  Filled 2023-11-02: qty 2

## 2023-11-02 MED ORDER — POTASSIUM CHLORIDE 10 MEQ/100ML IV SOLN
10.0000 meq | INTRAVENOUS | Status: AC
  Administered 2023-11-02 (×4): 10 meq via INTRAVENOUS
  Filled 2023-11-02 (×4): qty 100

## 2023-11-02 MED ORDER — LACTATED RINGERS IV SOLN: Freq: Once | INTRAVENOUS | Status: AC

## 2023-11-02 NOTE — Progress Notes (Signed)
   11/02/23 2045  BiPAP/CPAP/SIPAP  $ Non-Invasive Ventilator  Non-Invasive Vent Subsequent  BiPAP/CPAP/SIPAP Pt Type Adult  BiPAP/CPAP/SIPAP Resmed  Mask Type Full face mask  Mask Size Medium  IPAP 16 cmH20  EPAP 6 cmH2O  Flow Rate 4 lpm  Patient Home Machine No  Patient Home Mask No  Patient Home Tubing No  Auto Titrate No  CPAP/SIPAP surface wiped down Yes  Device Plugged into RED Power Outlet Yes  BiPAP/CPAP /SiPAP Vitals  SpO2 92 %  Bilateral Breath Sounds Diminished

## 2023-11-02 NOTE — Progress Notes (Signed)
   Palliative Medicine Inpatient Follow Up Note HPI: Palliative Care consult requested for goals of care discussion in this 73 y.o. female  with past medical history of chronic hypoxic and hypercarbic respiratory failure (4 L home oxygen ), nocturnal BiPAP however patient is not using, diastolic heart failure, hypothyroidism, COPD, obesity, sleep apnea, bedbound.  She was admitted on 10/30/2023 from home with worsening confusion.  Patient recently discharged on 8/15 for similar episode.  Chest x-ray showed pulmonary edema.  Requiring BiPAP.   Today's Discussion 11/02/2023  *Please note that this is a verbal dictation therefore any spelling or grammatical errors are due to the Dragon Medical One system interpretation.  Chart reviewed inclusive of vital signs, progress notes, laboratory results, and diagnostic images.   I met with Shamonique at bedside. Her spouse, [name] NOVAK was present. We discussed her current reason(s) for hospitalization inclusive of respiratory failure secondary to noncompliance with bipap. We reviewed the effect this will have on her in the short and long term.   Concerns associated with re-hospitalizations brought to light as related to chronic disease burden.   Created space and opportunity for patient to explore thoughts feelings and fears regarding current medical situation. She remains hopeful for improvements though is open to ongoing conversations.   I shared that I would check in tomorrow to further discuss goals/wishes and aid in completion of a MOST form.    Plan for OP Palliative support on discharge.  Questions and concerns addressed/Palliative Support Provided.   Objective Assessment: Vital Signs Vitals:   11/02/23 0816 11/02/23 0850  BP: 108/63 124/72  Pulse: 82 83  Resp: 19 20  Temp: 98.3 F (36.8 C)   SpO2: 94% 96%    Intake/Output Summary (Last 24 hours) at 11/02/2023 1007 Last data filed at 11/02/2023 9083 Gross per 24 hour  Intake 2724.6 ml   Output 2150 ml  Net 574.6 ml   Last Weight  Most recent update: 10/31/2023  1:08 AM    Weight  114.4 kg (252 lb 3.3 oz)            Gen:  Elderly Caucasian F chronically ill appearing HEENT: moist mucous membranes CV: Regular rate and rhythm  PULM:  On 4LPM [place], breathing is nonlabored ABD: soft/nontender  EXT:  Multiple areas of ecchymosis on BUE, (+) BLE dependent edema Neuro: Alert and oriented x2  SUMMARY OF RECOMMENDATIONS   DNAR/DNI  Allowing time for outcomes  Coordination of OP Palliative support on discharge  Plan for transition to [name] once medically optimized  Ongoing PMT support ______________________________________________________________________________________ [facility] Palliative Medicine Team Team Cell Phone: [phone] Please utilize secure chat with additional questions, if there is no response within 30 minutes please call the above phone number  Billing based on MDM: Moderate   Palliative Medicine Team providers are available by phone from 7am to 7pm daily and can be reached through the team cell phone.  Should this patient require assistance outside of these hours, please call the patient's attending physician.

## 2023-11-02 NOTE — Progress Notes (Signed)
 PROGRESS NOTE    Morgan Odonnell  FMW:[id number] DOB: 1950/10/06 DOA: 10/30/2023 PCP: Pcp, No   Brief Narrative:  73 year old with history of morbid obesity, sleep apnea, obesity hypoventilation, chronic hypoxemic and hypercarbic respiratory failure on 4 L oxygen  at home, BiPAP at night but does not use it, chronic diastolic heart failure, COPD, hypothyroidism, CKD3a, bedbound status brought to the emergency room with worsening confusion.  Noncompliance to BiPAP at home.  Discharged with similar episode on 8/15 but family unable to care for patient at home given worsening confusion in the setting of noncompliance.  Assessment & Plan:   Principal Problem:   CAP (community acquired pneumonia) Active Problems:   Acute metabolic encephalopathy   Pressure injury of skin   Acute metabolic encephalopathy moderately improving Acute on chronic hypoxic/hypercarbic respiratory failure Complicated by severe sleep apnea and obesity hypoventilation -Secondary to non-compliance -Continue supportive care - now compliant/tolerating BiPAP overnight/during the day - Hoping to wean patient off BiPAP dependence, trial to use only while sleeping today, will make disposition to SNF difficult if she continues to be on as needed BiPAP - could consider LTAC  Acute on chronic diastolic congestive heart failure, right-sided failure. - Not in acute exacerbation - Continue holding diuretics -currently hypovolemic, low-dose IV fluids as below   Suspected pneumonia - Continue empiric coverage with Zosyn , vancomycin  given recent hospitalization and baseline respiratory status  - High risk for aspiration given above mental status changes - Consider transitioning to p.o. antibiotics in the next 24 hours -Patient does not meet sepsis criteria   AKI on CKD stage IIIa  Hypokalemia  - Renal function minimally elevated overnight, somewhat labile history with GFR ranging from 30-60 over the past few weeks - carries  former diagnosis of CKD 3A - Continue to advance diet as tolerated - Low rate IV fluids given poor p.o. intake surrounding BiPAP dependence, monitor closely given heart failure history - Potassium replaced, continue to follow currently within normal limits   COPD without acute exacerbation: Continue nebulizer/inhaler therapy  Hypothyroidism: On Synthroid . Bilateral leg lymphedema: Chronic.  Goal of care: Discussed with patient and husband at bedside at length today given recurrent hospitalizations worsening mental status and noncompliance patient remains a remarkably high risk for decompensation. We discussed that patient is DNR thus she has made some decisions about her goals of care in the past.  Appreciate palliative care insight and recommendations as we move forward, patient still hopeful for recovery and will likely dispo to SNF per family discussion but ultimately would like to end up at home with family.  Body mass index is 46.13 kg/m.   DVT prophylaxis: enoxaparin  (LOVENOX ) injection 40 mg Start: 10/31/23 0114 Code Status:   Code Status: Limited: Do not attempt resuscitation (DNR) -DNR-LIMITED -Do Not Intubate/DNI  Family Communication: Husband at bedside, son on the phone  Status is: Inpatient  Dispo: The patient is from: Home              Anticipated d/c is to: SNF              Anticipated d/c date is: 4872 hours              Patient currently not medically stable for discharge  Consultants:  Palliative care  Procedures:  None  Antimicrobials:  Vent, Zosyn   Subjective: No acute issues or events overnight, appears to be improving somewhat per family at bedside, more awake alert oriented today than previous, not requiring BiPAP today sitting up watching TV.  Objective: Vitals:   11/01/23 2111 11/01/23 2300 11/01/23 2345 11/02/23 0407  BP: (!) 129/57  (!) 132/59 (!) 115/55  Pulse: 81 72 70 80  Resp: 20 16 16 15   Temp:   98.8 F (37.1 C) 97.6 F (36.4 C)   TempSrc:   Oral Oral  SpO2: 95% 98% 100% 90%  Weight:      Height:        Intake/Output Summary (Last 24 hours) at 11/02/2023 0715 Last data filed at 11/02/2023 0437 Gross per 24 hour  Intake 724.93 ml  Output 2650 ml  Net -1925.07 ml   Filed Weights   10/31/23 0107  Weight: 114.4 kg    Examination:  General: Appears chronically unwell but in no acute distress Neck: Obese, landmarks not visualized. Lungs: Distant but without clear wheeze or rales Heart:  Regular rate and rhythm.  Without murmurs, rubs, or gallops. Abdomen: Obese, nontender. Extremities: Profound lymphedema changes without clear pitting edema.  Data Reviewed: I have personally reviewed following labs and imaging studies  CBC: Recent Labs  Lab 10/30/23 2045 10/30/23 2058 10/31/23 0011 10/31/23 0521 11/01/23 0508 11/02/23 0535  WBC 9.7  --   --  9.8 8.7 9.1  NEUTROABS 6.6  --   --   --  5.0  --   HGB 12.6 13.6 11.2* 11.3* 10.3* 10.4*  HCT 40.0 40.0 33.0* 35.1* 31.8* 33.0*  MCV 89.3  --   --  85.0 85.9 86.8  PLT 252  --   --  255 257 265   Basic Metabolic Panel: Recent Labs  Lab 10/30/23 2045 10/30/23 2058 10/31/23 0011 10/31/23 0521 10/31/23 2225 11/01/23 0508 11/02/23 0535  NA 136   < > 137 137 135 135 137  K 3.5   < > 2.6* 2.7* 4.1 3.5 3.1*  CL 97*  --   --  100 100 104 102  CO2 27  --   --  27 26 27 27   GLUCOSE 111*  --   --  83 82 78 88  BUN 24*  --   --  22 23 20 14   CREATININE 1.39*  --   --  1.29* 1.37* 1.36* 1.59*  CALCIUM  9.9  --   --  9.3 8.8* 8.5* 8.8*  MG 1.8  --   --  1.7  --  1.7  --    < > = values in this interval not displayed.   GFR: Estimated Creatinine Clearance: 38.8 mL/min (A) (by C-G formula based on SCr of 1.59 mg/dL (H)). Liver Function Tests: Recent Labs  Lab 10/30/23 2045 10/31/23 0521  AST 39 29  ALT 19 16  ALKPHOS 91 75  BILITOT 1.4* 1.6*  PROT 8.2* 7.3  ALBUMIN 2.6* 2.2*   Cardiac Enzymes: Recent Labs  Lab 10/30/23 2045  CKTOTAL 40    Thyroid  Function Tests: Recent Labs    10/30/23 2045 10/31/23 0521  TSH 9.677* 7.484*  FREET4 1.42*  --    Sepsis Labs: Recent Labs  Lab 10/30/23 2103 10/31/23 0521  PROCALCITON  --  0.10  LATICACIDVEN 1.7  --     Recent Results (from the past 240 hours)  Blood culture (routine x 2)     Status: None (Preliminary result)   Collection Time: 10/30/23  8:59 PM   Specimen: BLOOD  Result Value Ref Range Status   Specimen Description BLOOD RIGHT ANTECUBITAL  Final   Special Requests   Final    BOTTLES DRAWN AEROBIC AND ANAEROBIC Blood Culture  results may not be optimal due to an inadequate volume of blood received in culture bottles   Culture   Final    NO GROWTH 2 DAYS Performed at [facility], [address], [place], [place] [address]    Report Status PENDING  Incomplete  Blood culture (routine x 2)     Status: None (Preliminary result)   Collection Time: 10/30/23  8:59 PM   Specimen: BLOOD  Result Value Ref Range Status   Specimen Description BLOOD LEFT ANTECUBITAL  Final   Special Requests   Final    BOTTLES DRAWN AEROBIC AND ANAEROBIC Blood Culture results may not be optimal due to an inadequate volume of blood received in culture bottles   Culture   Final    NO GROWTH 2 DAYS Performed at [facility], [address] [place], [place] [address]    Report Status PENDING  Incomplete  Urine Culture     Status: Abnormal   Collection Time: 10/30/23 11:40 PM   Specimen: Urine, Catheterized  Result Value Ref Range Status   Specimen Description URINE, CATHETERIZED  Final   Special Requests   Final    NONE Performed at [facility], [address], [place], [place] [address]    Culture >=100,000 COLONIES/mL STAPHYLOCOCCUS EPIDERMIDIS (A)  Final   Report Status 11/01/2023 FINAL  Final   Organism ID, Bacteria STAPHYLOCOCCUS EPIDERMIDIS (A)  Final      Susceptibility   Staphylococcus epidermidis - MIC*    CIPROFLOXACIN >=8 RESISTANT Resistant      GENTAMICIN <=0.5 SENSITIVE Sensitive     NITROFURANTOIN <=16 SENSITIVE Sensitive     OXACILLIN >=4 RESISTANT Resistant     TETRACYCLINE 2 SENSITIVE Sensitive     VANCOMYCIN  <=0.5 SENSITIVE Sensitive     TRIMETH/SULFA 160 RESISTANT Resistant     RIFAMPIN <=0.5 SENSITIVE Sensitive     Inducible Clindamycin NEGATIVE Sensitive     * >=100,000 COLONIES/mL STAPHYLOCOCCUS EPIDERMIDIS         Radiology Studies: No results found.       Scheduled Meds:  aspirin  EC  81 mg Oral Daily   baclofen   10 mg Oral TID   cholecalciferol   5,000 Units Oral Daily   citalopram   20 mg Oral Daily   diclofenac  Sodium  2 g Topical QID   enoxaparin  (LOVENOX ) injection  40 mg Subcutaneous Q24H   fluticasone  furoate-vilanterol  1 puff Inhalation Daily   Gerhardt's butt cream   Topical BID   levothyroxine   200 mcg Oral QAC breakfast   Methylnaltrexone  Bromide  3 tablet Oral Daily   naloxegol  oxalate  25 mg Oral Daily   spironolactone   25 mg Oral Daily   umeclidinium bromide   1 puff Inhalation Daily   Continuous Infusions:  piperacillin -tazobactam (ZOSYN )  IV 3.375 g (11/02/23 0548)   vancomycin  1,750 mg (11/01/23 2106)     LOS: 2 days   Time spent:  Elsie JAYSON Montclair, DO [facility]  If 7PM-7AM, please contact night-coverage [email or web address]  11/02/2023, 7:15 AM

## 2023-11-03 DIAGNOSIS — Z7189 Other specified counseling: Secondary | ICD-10-CM | POA: Diagnosis not present

## 2023-11-03 DIAGNOSIS — J189 Pneumonia, unspecified organism: Secondary | ICD-10-CM | POA: Diagnosis not present

## 2023-11-03 DIAGNOSIS — Z515 Encounter for palliative care: Secondary | ICD-10-CM | POA: Diagnosis not present

## 2023-11-03 MED ORDER — ENSURE PLUS HIGH PROTEIN PO LIQD
237.0000 mL | Freq: Two times a day (BID) | ORAL | Status: DC
  Administered 2023-11-03 – 2023-11-12 (×14): 237 mL via ORAL

## 2023-11-03 MED ORDER — POTASSIUM CHLORIDE 20 MEQ PO PACK
40.0000 meq | PACK | Freq: Once | ORAL | Status: AC
  Administered 2023-11-03: 40 meq via ORAL
  Filled 2023-11-03: qty 2

## 2023-11-03 NOTE — TOC Progression Note (Signed)
 Transition of Care [facility]) - Progression Note    Patient Details  Name: ROMIE KEEBLE MRN: [id number] Date of Birth: 1951/02/07  Transition of [facility] CM/SW Contact  Montie LOISE Louder, [place] Phone Number: 11/03/2023, 2:45 PM  Clinical Narrative:     Called spouse to confirm disposition plan- no answer, will call again today , if times allows.  Montie Louder, MSW, LCSW Clinical Social Worker    Expected Discharge Plan: Skilled Nursing Facility Barriers to Discharge: Continued Medical Work up               Expected Discharge Plan and Services In-house Referral: Clinical Social Work     Living arrangements for the past 2 months: Single Family Home                                       Social Drivers of Health (SDOH) Interventions SDOH Screenings   Food Insecurity: No Food Insecurity (10/31/2023)  Housing: Low Risk  (10/31/2023)  Transportation Needs: Unmet Transportation Needs (10/31/2023)  Utilities: Not At Risk (10/31/2023)  Depression (PHQ2-9): Medium Risk (01/13/2019)  Social Connections: Moderately Isolated (10/31/2023)  Tobacco Use: Medium Risk (10/31/2023)    Readmission Risk Interventions    09/19/2023    1:52 PM 08/17/2023    9:31 AM  Readmission Risk Prevention Plan  Transportation Screening Complete Complete  PCP or Specialist Appt within 5-7 Days  Complete  PCP or Specialist Appt within 3-5 Days Complete   Home Care Screening  Complete  Medication Review (RN CM)  Complete  HRI or Home Care Consult Complete   Social Work Consult for Recovery Care Planning/Counseling Complete   Palliative Care Screening Complete   Medication Review Oceanographer) Complete

## 2023-11-03 NOTE — Progress Notes (Signed)
 PROGRESS NOTE    Morgan Odonnell  FMW:[id number] DOB: [birth year] DOA: 10/30/2023 PCP: Pcp, No  Brief Narrative:  This 73 years old female with history of morbid obesity, sleep apnea, obesity hypoventilation, chronic hypoxemic and hypercarbic respiratory failure on 4 L oxygen  at home, BiPAP at night but does not use it, chronic diastolic heart failure, COPD, hypothyroidism, CKD3a, bedbound status brought to the ED with worsening confusion.  Noncompliance to BiPAP at home.  Discharged with similar episode on 8/15 but family unable to care for patient at home given worsening confusion in the setting of noncompliance.   Assessment & Plan:   Principal Problem:   CAP (community acquired pneumonia) Active Problems:   Acute metabolic encephalopathy   Pressure injury of skin   Acute metabolic encephalopathy >  Improving. Acute on chronic hypoxic / hypercarbic respiratory failure: Complicated by severe sleep apnea and obesity hypoventilation: -Secondary to non-compliance -Continue supportive care - Now compliant / tolerating BiPAP overnight / during the day - Hoping to wean patient off BiPAP dependence, trial to use only while sleeping , will make disposition to SNF difficult if she continues to be on as needed BiPAP - could consider LTAC. - She seems improved , appears back to her baseline mental status.   Acute on chronic diastolic congestive heart failure, right-sided failure: - Not in acute exacerbation. - Continue holding diuretics -Currently hypovolemic, Continue low-dose IV fluids as below.   Suspected pneumonia: -High risk for aspiration given above mental status changes. -Continue empiric coverage with Zosyn , vancomycin  given recent hospitalization and baseline respiratory status. -Consider transitioning to p.o. antibiotics in the next 24 hours. -Patient does not meet sepsis criteria.   AKI on CKD stage IIIa : - carries former diagnosis of CKD 3A. - Low rate IV fluids given  poor p.o. intake surrounding BiPAP dependence, monitor closely given heart failure history.  Hypokalemia: Potassium replaced.  Continue to monitor.   COPD without acute exacerbation: Continue nebulizer/inhaler therapy .  Hypothyroidism: Continue Synthroid .  Bilateral leg lymphedema: Chronic.Continue supportive care.   Goals of care: Discussed with patient and husband at bedside at length  given recurrent hospitalizations,  worsening mental status and noncompliance,  patient remains a remarkably high risk for decompensation. Palliative care is consulted. Patient still hopeful for recovery and will likely dispo to SNF per family discussion but ultimately would like to end up at home with family.   Morbid obesity : Body mass index is 46.13 kg/m. Diet and Exercise discussed in detail.  DVT prophylaxis: Lovenox  Code Status: DNR Family Communication: Husband At bedside. Disposition Plan:    Status is: Inpatient Remains inpatient appropriate because: Severity of illness.  Consultants:  Palliative care  Procedures: None  Antimicrobials:  Anti-infectives (From admission, onward)    Start     Dose/Rate Route Frequency Ordered Stop   11/01/23 2200  vancomycin  (VANCOREADY) IVPB 1750 mg/350 mL        1,750 mg 175 mL/hr over 120 Minutes Intravenous Every 48 hours 10/31/23 0333     10/31/23 0600  piperacillin -tazobactam (ZOSYN ) IVPB 3.375 g        3.375 g 12.5 mL/hr over 240 Minutes Intravenous Every 8 hours 10/31/23 0118     10/30/23 2215  piperacillin -tazobactam (ZOSYN ) IVPB 3.375 g        3.375 g 100 mL/hr over 30 Minutes Intravenous  Once 10/30/23 2200 10/30/23 2241   10/30/23 2215  vancomycin  (VANCOREADY) IVPB 1750 mg/350 mL        1,750 mg 175 mL/hr over  120 Minutes Intravenous  Once 10/30/23 2200 10/31/23 0153      Subjective: Patient was seen and examined at bedside.  Overnight events noted. She wants to go home.  Husband at bedside states he is not able to take care of  her given due to noncompliance.  Objective: Vitals:   11/02/23 2309 11/03/23 0405 11/03/23 0805 11/03/23 0857  BP: 121/66 (!) 141/71 (!) 122/58   Pulse: 66 83 86   Resp: 14 20 (!) 24   Temp: 97.7 F (36.5 C) 97.6 F (36.4 C)  98.7 F (37.1 C)  TempSrc: Oral Axillary  Oral  SpO2: 93% 97% 98%   Weight:      Height:        Intake/Output Summary (Last 24 hours) at 11/03/2023 1037 Last data filed at 11/03/2023 0720 Gross per 24 hour  Intake 1510.74 ml  Output 400 ml  Net 1110.74 ml   Filed Weights   10/31/23 0107  Weight: 114.4 kg    Examination:  General exam: Appears calm and comfortable, not in any acute distress.  Morbidly obese. Respiratory system: CTA Bilaterally . Respiratory effort normal.  RR 14 Cardiovascular system: S1 & S2 heard, RRR. No JVD, murmurs, rubs, gallops or clicks. Gastrointestinal system: Abdomen is non distended, soft and nontender. Normal bowel sounds heard. Central nervous system: Alert and oriented x 3. No focal neurological deficits. Extremities: Edema+, no cyanosis, no clubbing Skin: No rashes, lesions or ulcers Psychiatry: Judgement and insight appear normal. Mood & affect appropriate.     Data Reviewed: I have personally reviewed following labs and imaging studies  CBC: Recent Labs  Lab 10/30/23 2045 10/30/23 2058 10/31/23 0011 10/31/23 0521 11/01/23 0508 11/02/23 0535  WBC 9.7  --   --  9.8 8.7 9.1  NEUTROABS 6.6  --   --   --  5.0  --   HGB 12.6 13.6 11.2* 11.3* 10.3* 10.4*  HCT 40.0 40.0 33.0* 35.1* 31.8* 33.0*  MCV 89.3  --   --  85.0 85.9 86.8  PLT 252  --   --  255 257 265   Basic Metabolic Panel: Recent Labs  Lab 10/30/23 2045 10/30/23 2058 10/31/23 0011 10/31/23 0521 10/31/23 2225 11/01/23 0508 11/02/23 0535  NA 136   < > 137 137 135 135 137  K 3.5   < > 2.6* 2.7* 4.1 3.5 3.1*  CL 97*  --   --  100 100 104 102  CO2 27  --   --  27 26 27 27   GLUCOSE 111*  --   --  83 82 78 88  BUN 24*  --   --  22 23 20 14    CREATININE 1.39*  --   --  1.29* 1.37* 1.36* 1.59*  CALCIUM  9.9  --   --  9.3 8.8* 8.5* 8.8*  MG 1.8  --   --  1.7  --  1.7  --    < > = values in this interval not displayed.   GFR: Estimated Creatinine Clearance: 38.8 mL/min (A) (by C-G formula based on SCr of 1.59 mg/dL (H)). Liver Function Tests: Recent Labs  Lab 10/30/23 2045 10/31/23 0521  AST 39 29  ALT 19 16  ALKPHOS 91 75  BILITOT 1.4* 1.6*  PROT 8.2* 7.3  ALBUMIN 2.6* 2.2*   No results for input(s): LIPASE, AMYLASE in the last 168 hours. No results for input(s): AMMONIA in the last 168 hours. Coagulation Profile: No results for input(s): INR, PROTIME in  the last 168 hours. Cardiac Enzymes: Recent Labs  Lab 10/30/23 2045  CKTOTAL 40   BNP (last 3 results) No results for input(s): PROBNP in the last 8760 hours. HbA1C: No results for input(s): HGBA1C in the last 72 hours. CBG: No results for input(s): GLUCAP in the last 168 hours. Lipid Profile: No results for input(s): CHOL, HDL, LDLCALC, TRIG, CHOLHDL, LDLDIRECT in the last 72 hours. Thyroid  Function Tests: No results for input(s): TSH, T4TOTAL, FREET4, T3FREE, THYROIDAB in the last 72 hours. Anemia Panel: No results for input(s): VITAMINB12, FOLATE, FERRITIN, TIBC, IRON, RETICCTPCT in the last 72 hours. Sepsis Labs: Recent Labs  Lab 10/30/23 2103 10/31/23 0521  PROCALCITON  --  0.10  LATICACIDVEN 1.7  --     Recent Results (from the past 240 hours)  Blood culture (routine x 2)     Status: None (Preliminary result)   Collection Time: 10/30/23  8:59 PM   Specimen: BLOOD  Result Value Ref Range Status   Specimen Description BLOOD RIGHT ANTECUBITAL  Final   Special Requests   Final    BOTTLES DRAWN AEROBIC AND ANAEROBIC Blood Culture results may not be optimal due to an inadequate volume of blood received in culture bottles   Culture   Final    NO GROWTH 4 DAYS Performed at [facility],  [address], [place], [place] [address]    Report Status PENDING  Incomplete  Blood culture (routine x 2)     Status: None (Preliminary result)   Collection Time: 10/30/23  8:59 PM   Specimen: BLOOD  Result Value Ref Range Status   Specimen Description BLOOD LEFT ANTECUBITAL  Final   Special Requests   Final    BOTTLES DRAWN AEROBIC AND ANAEROBIC Blood Culture results may not be optimal due to an inadequate volume of blood received in culture bottles   Culture   Final    NO GROWTH 4 DAYS Performed at [facility], [address], [place], [place] [address]    Report Status PENDING  Incomplete  Urine Culture     Status: Abnormal   Collection Time: 10/30/23 11:40 PM   Specimen: Urine, Catheterized  Result Value Ref Range Status   Specimen Description URINE, CATHETERIZED  Final   Special Requests   Final    NONE Performed at [facility], [address] [place], [place] [address]    Culture >=100,000 COLONIES/mL STAPHYLOCOCCUS EPIDERMIDIS (A)  Final   Report Status 11/01/2023 FINAL  Final   Organism ID, Bacteria STAPHYLOCOCCUS EPIDERMIDIS (A)  Final      Susceptibility   Staphylococcus epidermidis - MIC*    CIPROFLOXACIN >=8 RESISTANT Resistant     GENTAMICIN <=0.5 SENSITIVE Sensitive     NITROFURANTOIN <=16 SENSITIVE Sensitive     OXACILLIN >=4 RESISTANT Resistant     TETRACYCLINE 2 SENSITIVE Sensitive     VANCOMYCIN  <=0.5 SENSITIVE Sensitive     TRIMETH/SULFA 160 RESISTANT Resistant     RIFAMPIN <=0.5 SENSITIVE Sensitive     Inducible Clindamycin NEGATIVE Sensitive     * >=100,000 COLONIES/mL STAPHYLOCOCCUS EPIDERMIDIS  Gastrointestinal Panel by PCR , Stool     Status: None   Collection Time: 11/01/23  7:39 AM   Specimen: Stool  Result Value Ref Range Status   Campylobacter species NOT DETECTED NOT DETECTED Final   Plesimonas shigelloides NOT DETECTED NOT DETECTED Final   Salmonella species NOT DETECTED NOT DETECTED Final   Yersinia enterocolitica NOT DETECTED  NOT DETECTED  Final   Vibrio species NOT DETECTED NOT DETECTED Final   Vibrio cholerae NOT DETECTED NOT DETECTED Final   Enteroaggregative E coli (EAEC) NOT DETECTED NOT DETECTED Final   Enteropathogenic E coli (EPEC) NOT DETECTED NOT DETECTED Final   Enterotoxigenic E coli (ETEC) NOT DETECTED NOT DETECTED Final   Shiga like toxin producing E coli (STEC) NOT DETECTED NOT DETECTED Final   Shigella/Enteroinvasive E coli (EIEC) NOT DETECTED NOT DETECTED Final   Cryptosporidium NOT DETECTED NOT DETECTED Final   Cyclospora cayetanensis NOT DETECTED NOT DETECTED Final   Entamoeba histolytica NOT DETECTED NOT DETECTED Final   Giardia lamblia NOT DETECTED NOT DETECTED Final   Adenovirus F40/41 NOT DETECTED NOT DETECTED Final   Astrovirus NOT DETECTED NOT DETECTED Final   Norovirus GI/GII NOT DETECTED NOT DETECTED Final   Rotavirus A NOT DETECTED NOT DETECTED Final   Sapovirus (I, II, IV, and V) NOT DETECTED NOT DETECTED Final    Comment: Performed at [facility], [address] [place], [place] [address]    Radiology Studies: No results found.  Scheduled Meds:  aspirin  EC  81 mg Oral Daily   baclofen   10 mg Oral TID   cholecalciferol   5,000 Units Oral Daily   citalopram   20 mg Oral Daily   diclofenac  Sodium  2 g Topical QID   enoxaparin  (LOVENOX ) injection  40 mg Subcutaneous Q24H   feeding supplement  237 mL Oral BID BM   fluticasone  furoate-vilanterol  1 puff Inhalation Daily   Gerhardt's butt cream   Topical BID   levothyroxine   200 mcg Oral QAC breakfast   spironolactone   25 mg Oral Daily   umeclidinium bromide   1 puff Inhalation Daily   Continuous Infusions:  piperacillin -tazobactam (ZOSYN )  IV 3.375 g (11/03/23 9366)   vancomycin  1,750 mg (11/01/23 2106)     LOS: 3 days    Time spent: 50 Mins    Darcel Dawley, MD [facility]   If 7PM-7AM, please contact night-coverage

## 2023-11-03 NOTE — Progress Notes (Signed)
   11/03/23 2309  BiPAP/CPAP/SIPAP  BiPAP/CPAP/SIPAP Pt Type Adult  BiPAP/CPAP/SIPAP Resmed  Mask Type Full face mask  Mask Size Medium  Respiratory Rate 18 breaths/min  IPAP 12 cmH20  EPAP 6 cmH2O  Flow Rate 5 lpm  Patient Home Machine No  Patient Home Mask No  Patient Home Tubing No  Auto Titrate No  BiPAP/CPAP /SiPAP Vitals  Pulse Rate 82  Resp 18  SpO2 95 %  Bilateral Breath Sounds Clear;Diminished  MEWS Score/Color  MEWS Score 0  MEWS Score Color Landy

## 2023-11-03 NOTE — Progress Notes (Signed)
 Pharmacy Antibiotic Note  Morgan Odonnell is a 73 y.o. female admitted on 10/30/2023 with suspected pneumonia.  Pharmacy has been consulted for Vancomycin  dosing.   -WBC= 9.1, afebrile -SCr 1.59, CrCl ~ 40 -blood cultures- ngtd -urine cx- MRSE  Plans noted for possible oral antibiotics on 8/26  Plan: -Continue Vancomycin  1750 mg IV q48h; Estimated AUC: 534 -Continue Zosyn  3.375G IV q8h to be infused over 4 hours -Trend WBC, temp, renal function     Height: 5' 2 (157.5 cm) Weight: 114.4 kg (252 lb 3.3 oz) IBW/kg (Calculated) : 50.1  Temp (24hrs), Avg:97.8 F (36.6 C), Min:97.3 F (36.3 C), Max:98.7 F (37.1 C)  Recent Labs  Lab 10/30/23 2045 10/30/23 2103 10/31/23 0521 10/31/23 2225 11/01/23 0508 11/02/23 0535  WBC 9.7  --  9.8  --  8.7 9.1  CREATININE 1.39*  --  1.29* 1.37* 1.36* 1.59*  LATICACIDVEN  --  1.7  --   --   --   --     Estimated Creatinine Clearance: 38.8 mL/min (A) (by C-G formula based on SCr of 1.59 mg/dL (H)).    Allergies  Allergen Reactions   Gabapentin Other (See Comments)    slept for 2 days   Naloxone  Other (See Comments)    Allergy not listed on MAR    Novocain [Procaine] Other (See Comments)    intolerance    Prentice Poisson, PharmD Clinical Pharmacist **Pharmacist phone directory can now be found on [email or web address] ([email or web address]).  Listed under [facility].

## 2023-11-03 NOTE — Plan of Care (Signed)
   Problem: Nutrition: Goal: Adequate nutrition will be maintained Outcome: Progressing   Problem: Coping: Goal: Level of anxiety will decrease Outcome: Progressing   Problem: Elimination: Goal: Will not experience complications related to bowel motility Outcome: Progressing

## 2023-11-03 NOTE — Progress Notes (Signed)
 [facility] Liaison Note:  Notified by [facility] manager of patient request for [facility] Palliative services at home after discharge.   Met with patient and her husband and gave  information about both palliative and hospice services that [facility] can provide for them. Answered questions and gave them my contact information.  Please call with any hospice or outpatient palliative care related questions.  Thank you for the opportunity to participate in this patient's care.   Greig Basket, BSN, RN [facility] Liaison [phone]

## 2023-11-03 NOTE — Progress Notes (Signed)
 Palliative Medicine Inpatient Follow Up Note HPI: Palliative Care consult requested for goals of care discussion in this 73 y.o. female  with past medical history of chronic hypoxic and hypercarbic respiratory failure (4 L home oxygen ), nocturnal BiPAP however patient is not using, diastolic heart failure, hypothyroidism, COPD, obesity, sleep apnea, bedbound.  She was admitted on 10/30/2023 from home with worsening confusion.  Patient recently discharged on 8/15 for similar episode.  Chest x-ray showed pulmonary edema.  Requiring BiPAP.   Today's Discussion 11/03/2023  *Please note that this is a verbal dictation therefore any spelling or grammatical errors are due to the Dragon Medical One system interpretation.  Chart reviewed inclusive of vital signs, progress notes, laboratory results, and diagnostic images.   I met with Morgan Odonnell at bedside this morning in the presence of her spouse, Morgan Odonnell, and son, Morgan Odonnell on speaker phone. We reviewed her current clinical condition. I shared with them my concern(s) associated with Morgan Odonnell's overall health. We reviewed her elevated CO2 in the setting of noncompliance with bipap. We reviewed this and patients debility are all weighing in on her poor conditional improvement.   We reviewed the chronic disease trajectory in patients who have multiple co-morbidities. I shared that often an event will occur leading to an  acute hospitalizationsuch as a fall, UTI, PNA, heart failure exacerbation, copd exacerbation, or another illness sort. We discussed that patients may have been functioning at a high plateau initially, then an acute event occurs. We discussed that after this event their function, mental, and nutritional states are compromised. Often with treatment and rehabilitation there is some regain in each individuals health, though often not to their prior baseline level. We discussed that then another event will occur causing a rehospitalization and a  further decline. I shared that often this will become a pattern and each event causes greater burden to the individual, depleting them further or their function, cognition, or nutritional state.   We discussed should Morgan Odonnell continue to decline the idea of transition to hospice care/support.  Patients spouse and I reviewed the importance of understanding more about Palliative versus hospice care as an OP. I provided information on services and recommended further conversation(s) take place with Morgan Odonnell as they had been referred to in June. Patients spouse was agreeable to this.  In speaking to Morgan Odonnell herself she shares that she would want to pursue hospice. I clarified that she understood what this meant. I described hospice as a service for patients who have a life expectancy of 6 months or less. The goal of hospice is the preservation of dignity and quality at the end phases of life.  Under hospice care, the focus changes from curative to symptom relief. Morgan Odonnell does share understanding though her spouse and son vocalize the desire for her to go to Morgan Odonnell. We reviewed trying another round of rehabilitation though is Morgan Odonnell neglects to improve pursuing hospice care thereafter. Patient and her family were reaosnable emotional given the context of conversation.  Plan is to try rehabilitation with compliance with Bipap. If patient neglects to improve to further consider hospice support.   Questions and concerns addressed/Palliative Support Provided.   Objective Assessment: Vital Signs Vitals:   11/03/23 0805 11/03/23 0857  BP: (!) 122/58   Pulse: 86   Resp: (!) 24   Temp:  98.7 F (37.1 C)  SpO2: 98%     Intake/Output Summary (Last 24 hours) at 11/03/2023 1022 Last data filed at 11/03/2023 0720 Gross per 24 hour  Intake  1510.74 ml  Output 400 ml  Net 1110.74 ml   Last Weight  Most recent update: 10/31/2023  1:08 AM    Weight  114.4 kg (252 lb 3.3 oz)             Gen:  Elderly Caucasian F chronically ill appearing HEENT: moist mucous membranes CV: Regular rate and rhythm  PULM:  On 4LPM [place], breathing is nonlabored ABD: soft/nontender  EXT:  Multiple areas of ecchymosis on BUE, (+) BLE dependent edema Neuro: Alert and oriented x2  SUMMARY OF RECOMMENDATIONS   DNAR/DNI  Allowing time for outcomes  Coordination of OP Palliative support on discharge  Plan for transition to [facility] SNF once medically optimized  Ongoing PMT support ______________________________________________________________________________________ Morgan Odonnell [place] Palliative Medicine Team Team Cell Phone: [phone] Please utilize secure chat with additional questions, if there is no response within 30 minutes please call the above phone number  Time: 44 MDM: High  Palliative Medicine Team providers are available by phone from 7am to 7pm daily and can be reached through the team cell phone.  Should this patient require assistance outside of these hours, please call the patient's attending physician.

## 2023-11-04 DIAGNOSIS — J189 Pneumonia, unspecified organism: Secondary | ICD-10-CM | POA: Diagnosis not present

## 2023-11-04 DIAGNOSIS — Z515 Encounter for palliative care: Secondary | ICD-10-CM | POA: Diagnosis not present

## 2023-11-04 DIAGNOSIS — Z7189 Other specified counseling: Secondary | ICD-10-CM | POA: Diagnosis not present

## 2023-11-04 LAB — CBC
HCT: 32.7 % — ABNORMAL LOW (ref 36.0–46.0)
Hemoglobin: 10.1 g/dL — ABNORMAL LOW (ref 12.0–15.0)
MCH: 27.2 pg (ref 26.0–34.0)
MCHC: 30.9 g/dL (ref 30.0–36.0)
MCV: 88.1 fL (ref 80.0–100.0)
Platelets: 201 K/uL (ref 150–400)
RBC: 3.71 MIL/uL — ABNORMAL LOW (ref 3.87–5.11)
RDW: 17.2 % — ABNORMAL HIGH (ref 11.5–15.5)
WBC: 10.3 K/uL (ref 4.0–10.5)
nRBC: 0 % (ref 0.0–0.2)

## 2023-11-04 LAB — BASIC METABOLIC PANEL WITH GFR
Anion gap: 9 (ref 5–15)
BUN: 12 mg/dL (ref 8–23)
CO2: 23 mmol/L (ref 22–32)
Calcium: 9 mg/dL (ref 8.9–10.3)
Chloride: 105 mmol/L (ref 98–111)
Creatinine, Ser: 1.33 mg/dL — ABNORMAL HIGH (ref 0.44–1.00)
GFR, Estimated: 43 mL/min — ABNORMAL LOW (ref >60)
Glucose, Bld: 83 mg/dL (ref 70–99)
Potassium: 3.6 mmol/L (ref 3.5–5.1)
Sodium: 137 mmol/L (ref 135–145)

## 2023-11-04 LAB — CULTURE, BLOOD (ROUTINE X 2)
Culture: NO GROWTH
Culture: NO GROWTH

## 2023-11-04 LAB — PHOSPHORUS: Phosphorus: 3.4 mg/dL (ref 2.5–4.6)

## 2023-11-04 LAB — MAGNESIUM: Magnesium: 1.7 mg/dL (ref 1.7–2.4)

## 2023-11-04 MED ORDER — LEVOFLOXACIN 750 MG PO TABS
750.0000 mg | ORAL_TABLET | Freq: Once | ORAL | Status: AC
  Administered 2023-11-04: 750 mg via ORAL
  Filled 2023-11-04: qty 1

## 2023-11-04 MED ORDER — HEPARIN SODIUM (PORCINE) 5000 UNIT/ML IJ SOLN
5000.0000 [IU] | Freq: Three times a day (TID) | INTRAMUSCULAR | Status: DC
  Administered 2023-11-05 – 2023-11-12 (×22): 5000 [IU] via SUBCUTANEOUS
  Filled 2023-11-04 (×22): qty 1

## 2023-11-04 NOTE — Progress Notes (Signed)
 Mobility Specialist Progress Note;   11/04/23 1210  Mobility  Activity Pivoted/transferred from chair to bed  Level of Assistance Moderate assist, patient does 50-74%  Assistive Device Front wheel walker  Distance Ambulated (ft) 5 ft  Activity Response Tolerated fair  Mobility Referral Yes  Mobility visit 1 Mobility  Mobility Specialist Start Time (ACUTE ONLY) 1210  Mobility Specialist Stop Time (ACUTE ONLY) 1220  Mobility Specialist Time Calculation (min) (ACUTE ONLY) 10 min   NT requesting assistance w/ pt. Required ModA +3; husband and NT, to safely transfer pt from chair to bed. TotalA to assist BLE back into bed. Encouraged PLB once returned to supine to recover SPO2 on 4LO2. Pt left comfortably in bed with all needs met, BLE elevated. Bed alarm on, husband present.   Lauraine Erm Mobility Specialist Please contact via SecureChat or [employer] [phone]

## 2023-11-04 NOTE — Progress Notes (Signed)
 PROGRESS NOTE    Morgan Odonnell  FMW:[id number] DOB: January 04, 1951 DOA: 10/30/2023 PCP: Pcp, No  Brief Narrative:  This 73 years old female with history of morbid obesity, sleep apnea, obesity hypoventilation, chronic hypoxemic and hypercarbic respiratory failure on 4 L oxygen  at home, BiPAP at night but does not use it, chronic diastolic heart failure, COPD, hypothyroidism, CKD3a, bedbound status brought to the ED with worsening confusion.  Noncompliance to BiPAP at home.  Discharged with similar episode on 8/15 but family unable to care for patient at home given worsening confusion in the setting of noncompliance.   Assessment & Plan:   Principal Problem:   CAP (community acquired pneumonia) Active Problems:   Acute metabolic encephalopathy   Pressure injury of skin   Acute metabolic encephalopathy >  Improving. Acute on chronic hypoxic / hypercarbic respiratory failure: Complicated by severe sleep apnea and obesity hypoventilation: -Secondary to non-compliance -Continue supportive care - Now compliant / tolerating BiPAP overnight / during the day - Hoping to wean patient off BiPAP dependence, trial to use only while sleeping , will make disposition to SNF difficult if she continues to be on as needed BiPAP - could consider LTAC. - She seems improved , appears back to her baseline mental status. - Will observe her today on supplemental oxygen  , try not to use BIPAP.   Acute on chronic diastolic congestive heart failure, right-sided failure: - Not in acute exacerbation. - Continue holding diuretics. Resume at discharge.   Suspected pneumonia: -High risk for aspiration given mental status changes. -Continue empiric coverage with Zosyn , vancomycin  given recent hospitalization and baseline respiratory status. -Consider transitioning to p.o. antibiotics in the next 24 hours. -Patient does not meet sepsis criteria.   AKI on CKD stage IIIa : - carries former diagnosis of CKD 3A. -  Renal functions improving and back to baseline.  Hypokalemia: Potassium replaced.  Continue to monitor.   COPD without acute exacerbation: Continue nebulizer/inhaler therapy .  Hypothyroidism: Continue Synthroid .  Bilateral leg lymphedema: Chronic. Continue supportive care.   Goals of care: Discussed with patient and husband at bedside at length  given recurrent hospitalizations,  worsening mental status and noncompliance,  patient remains a remarkably high risk for decompensation. Palliative care is consulted. Patient still hopeful for recovery and will likely dispo to SNF per family discussion but ultimately would like to end up at home with family.   Morbid obesity : Body mass index is 46.13 kg/m. Diet and Exercise discussed in detail.  DVT prophylaxis: Lovenox  Code Status: DNR Family Communication: Husband At bedside. Disposition Plan:    Status is: Inpatient Remains inpatient appropriate because: Severity of illness.  Consultants:  Palliative care  Procedures: None  Antimicrobials:  Anti-infectives (From admission, onward)    Start     Dose/Rate Route Frequency Ordered Stop   11/01/23 2200  vancomycin  (VANCOREADY) IVPB 1750 mg/350 mL        1,750 mg 175 mL/hr over 120 Minutes Intravenous Every 48 hours 10/31/23 0333     10/31/23 0600  piperacillin -tazobactam (ZOSYN ) IVPB 3.375 g        3.375 g 12.5 mL/hr over 240 Minutes Intravenous Every 8 hours 10/31/23 0118     10/30/23 2215  piperacillin -tazobactam (ZOSYN ) IVPB 3.375 g        3.375 g 100 mL/hr over 30 Minutes Intravenous  Once 10/30/23 2200 10/30/23 2241   10/30/23 2215  vancomycin  (VANCOREADY) IVPB 1750 mg/350 mL        1,750 mg 175 mL/hr over 120  Minutes Intravenous  Once 10/30/23 2200 10/31/23 0153      Subjective: Patient was seen and examined at bedside. Overnight events noted. She was sitting comfortably on the chair.  Her BiPAP DME has been approved. Husband at bedside states he is not able to  take care of her given due to noncompliance.  Objective: Vitals:   11/04/23 0237 11/04/23 0753 11/04/23 0928 11/04/23 0953  BP: (!) 116/48   (!) 141/63  Pulse: 88     Resp: 16     Temp: 98.1 F (36.7 C)   98.2 F (36.8 C)  TempSrc: Axillary   Oral  SpO2:  93% 93%   Weight:      Height:        Intake/Output Summary (Last 24 hours) at 11/04/2023 1044 Last data filed at 11/04/2023 0415 Gross per 24 hour  Intake 550.24 ml  Output 1350 ml  Net -799.76 ml   Filed Weights   10/31/23 0107  Weight: 114.4 kg    Examination:  General exam: Appears calm and comfortable, not in any acute distress.  Morbidly obese. Respiratory system: CTA Bilaterally . Respiratory effort normal.  RR 14 Cardiovascular system: S1 & S2 heard, RRR. No JVD, murmurs, rubs, gallops or clicks.  Gastrointestinal system: Abdomen is non distended, soft and nontender. Normal bowel sounds heard. Central nervous system: Alert and oriented x 3. No focal neurological deficits. Extremities: Edema+, no cyanosis, no clubbing Skin: No rashes, lesions or ulcers Psychiatry: Judgement and insight appear normal. Mood & affect appropriate.    Data Reviewed: I have personally reviewed following labs and imaging studies  CBC: Recent Labs  Lab 10/30/23 2045 10/30/23 2058 10/31/23 0011 10/31/23 0521 11/01/23 0508 11/02/23 0535 11/04/23 0648  WBC 9.7  --   --  9.8 8.7 9.1 10.3  NEUTROABS 6.6  --   --   --  5.0  --   --   HGB 12.6   < > 11.2* 11.3* 10.3* 10.4* 10.1*  HCT 40.0   < > 33.0* 35.1* 31.8* 33.0* 32.7*  MCV 89.3  --   --  85.0 85.9 86.8 88.1  PLT 252  --   --  255 257 265 201   < > = values in this interval not displayed.   Basic Metabolic Panel: Recent Labs  Lab 10/30/23 2045 10/30/23 2058 10/31/23 0521 10/31/23 2225 11/01/23 0508 11/02/23 0535 11/04/23 0648  NA 136   < > 137 135 135 137 137  K 3.5   < > 2.7* 4.1 3.5 3.1* 3.6  CL 97*  --  100 100 104 102 105  CO2 27  --  27 26 27 27 23   GLUCOSE  111*  --  83 82 78 88 83  BUN 24*  --  22 23 20 14 12   CREATININE 1.39*  --  1.29* 1.37* 1.36* 1.59* 1.33*  CALCIUM  9.9  --  9.3 8.8* 8.5* 8.8* 9.0  MG 1.8  --  1.7  --  1.7  --  1.7  PHOS  --   --   --   --   --   --  3.4   < > = values in this interval not displayed.   GFR: Estimated Creatinine Clearance: 46.4 mL/min (A) (by C-G formula based on SCr of 1.33 mg/dL (H)). Liver Function Tests: Recent Labs  Lab 10/30/23 2045 10/31/23 0521  AST 39 29  ALT 19 16  ALKPHOS 91 75  BILITOT 1.4* 1.6*  PROT 8.2* 7.3  ALBUMIN 2.6* 2.2*   No results for input(s): LIPASE, AMYLASE in the last 168 hours. No results for input(s): AMMONIA in the last 168 hours. Coagulation Profile: No results for input(s): INR, PROTIME in the last 168 hours. Cardiac Enzymes: Recent Labs  Lab 10/30/23 2045  CKTOTAL 40   BNP (last 3 results) No results for input(s): PROBNP in the last 8760 hours. HbA1C: No results for input(s): HGBA1C in the last 72 hours. CBG: No results for input(s): GLUCAP in the last 168 hours. Lipid Profile: No results for input(s): CHOL, HDL, LDLCALC, TRIG, CHOLHDL, LDLDIRECT in the last 72 hours. Thyroid  Function Tests: No results for input(s): TSH, T4TOTAL, FREET4, T3FREE, THYROIDAB in the last 72 hours. Anemia Panel: No results for input(s): VITAMINB12, FOLATE, FERRITIN, TIBC, IRON, RETICCTPCT in the last 72 hours. Sepsis Labs: Recent Labs  Lab 10/30/23 2103 10/31/23 0521  PROCALCITON  --  0.10  LATICACIDVEN 1.7  --     Recent Results (from the past 240 hours)  Blood culture (routine x 2)     Status: None   Collection Time: 10/30/23  8:59 PM   Specimen: BLOOD  Result Value Ref Range Status   Specimen Description BLOOD RIGHT ANTECUBITAL  Final   Special Requests   Final    BOTTLES DRAWN AEROBIC AND ANAEROBIC Blood Culture results may not be optimal due to an inadequate volume of blood received in culture bottles    Culture   Final    NO GROWTH 5 DAYS Performed at [facility], [address], [place], [place] [address]    Report Status 11/04/2023 FINAL  Final  Blood culture (routine x 2)     Status: None   Collection Time: 10/30/23  8:59 PM   Specimen: BLOOD  Result Value Ref Range Status   Specimen Description BLOOD LEFT ANTECUBITAL  Final   Special Requests   Final    BOTTLES DRAWN AEROBIC AND ANAEROBIC Blood Culture results may not be optimal due to an inadequate volume of blood received in culture bottles   Culture   Final    NO GROWTH 5 DAYS Performed at [facility], [address], [place], [place] [address]    Report Status 11/04/2023 FINAL  Final  Urine Culture     Status: Abnormal   Collection Time: 10/30/23 11:40 PM   Specimen: Urine, Catheterized  Result Value Ref Range Status   Specimen Description URINE, CATHETERIZED  Final   Special Requests   Final    NONE Performed at [facility], [address], [place], [place] [address]    Culture >=100,000 COLONIES/mL STAPHYLOCOCCUS EPIDERMIDIS (A)  Final   Report Status 11/01/2023 FINAL  Final   Organism ID, Bacteria STAPHYLOCOCCUS EPIDERMIDIS (A)  Final      Susceptibility   Staphylococcus epidermidis - MIC*    CIPROFLOXACIN >=8 RESISTANT Resistant     GENTAMICIN <=0.5 SENSITIVE Sensitive     NITROFURANTOIN <=16 SENSITIVE Sensitive     OXACILLIN >=4 RESISTANT Resistant     TETRACYCLINE 2 SENSITIVE Sensitive     VANCOMYCIN  <=0.5 SENSITIVE Sensitive     TRIMETH/SULFA 160 RESISTANT Resistant     RIFAMPIN <=0.5 SENSITIVE Sensitive     Inducible Clindamycin NEGATIVE Sensitive     * >=100,000 COLONIES/mL STAPHYLOCOCCUS EPIDERMIDIS  Gastrointestinal Panel by PCR , Stool     Status: None   Collection Time: 11/01/23  7:39 AM   Specimen: Stool  Result Value Ref Range Status   Campylobacter  species NOT DETECTED NOT DETECTED Final   Plesimonas shigelloides NOT DETECTED NOT DETECTED Final   Salmonella species NOT  DETECTED NOT DETECTED Final   Yersinia enterocolitica NOT DETECTED NOT DETECTED Final   Vibrio species NOT DETECTED NOT DETECTED Final   Vibrio cholerae NOT DETECTED NOT DETECTED Final   Enteroaggregative E coli (EAEC) NOT DETECTED NOT DETECTED Final   Enteropathogenic E coli (EPEC) NOT DETECTED NOT DETECTED Final   Enterotoxigenic E coli (ETEC) NOT DETECTED NOT DETECTED Final   Shiga like toxin producing E coli (STEC) NOT DETECTED NOT DETECTED Final   Shigella/Enteroinvasive E coli (EIEC) NOT DETECTED NOT DETECTED Final   Cryptosporidium NOT DETECTED NOT DETECTED Final   Cyclospora cayetanensis NOT DETECTED NOT DETECTED Final   Entamoeba histolytica NOT DETECTED NOT DETECTED Final   Giardia lamblia NOT DETECTED NOT DETECTED Final   Adenovirus F40/41 NOT DETECTED NOT DETECTED Final   Astrovirus NOT DETECTED NOT DETECTED Final   Norovirus GI/GII NOT DETECTED NOT DETECTED Final   Rotavirus A NOT DETECTED NOT DETECTED Final   Sapovirus (I, II, IV, and V) NOT DETECTED NOT DETECTED Final    Comment: Performed at [facility], [address], [place], [place] [address]    Radiology Studies: No results found.  Scheduled Meds:  aspirin  EC  81 mg Oral Daily   baclofen   10 mg Oral TID   cholecalciferol   5,000 Units Oral Daily   citalopram   20 mg Oral Daily   diclofenac  Sodium  2 g Topical QID   enoxaparin  (LOVENOX ) injection  40 mg Subcutaneous Q24H   feeding supplement  237 mL Oral BID BM   fluticasone  furoate-vilanterol  1 puff Inhalation Daily   Gerhardt's butt cream   Topical BID   levothyroxine   200 mcg Oral QAC breakfast   spironolactone   25 mg Oral Daily   umeclidinium bromide   1 puff Inhalation Daily   Continuous Infusions:  piperacillin -tazobactam (ZOSYN )  IV 3.375 g (11/04/23 9386)   vancomycin  Stopped (11/04/23 0000)     LOS: 4 days    Time spent: 35 Mins    Darcel Dawley, MD [facility]   If 7PM-7AM, please contact night-coverage

## 2023-11-04 NOTE — Progress Notes (Signed)
 Patient is Morgan Odonnell with no clinical signs of distress or complaints of pain at this time. Patient is resting in bed and placed on bipap mask for sleep per patient request this hour.   Patient asked to call husband however nursing was unable to get a hold of husband.   Safety measures are in place, call light is within reach, and 4P's addressed.

## 2023-11-04 NOTE — Plan of Care (Signed)
   Problem: Safety: Goal: Ability to remain free from injury will improve Outcome: Progressing

## 2023-11-04 NOTE — Progress Notes (Signed)
   11/04/23 2313  BiPAP/CPAP/SIPAP  BiPAP/CPAP/SIPAP Pt Type Adult  BiPAP/CPAP/SIPAP Resmed  Mask Type Full face mask  Mask Size Medium  IPAP 12 cmH20  EPAP 6 cmH2O  Flow Rate 4 lpm  Patient Home Machine No  Patient Home Mask No  Patient Home Tubing No  Auto Titrate No

## 2023-11-04 NOTE — Progress Notes (Signed)
   Palliative Medicine Inpatient Follow Up Note HPI: Palliative Care consult requested for goals of care discussion in this 73 y.o. female  with past medical history of chronic hypoxic and hypercarbic respiratory failure (4 L home oxygen ), nocturnal BiPAP however patient is not using, diastolic heart failure, hypothyroidism, COPD, obesity, sleep apnea, bedbound.  She was admitted on 10/30/2023 from home with worsening confusion.  Patient recently discharged on 8/15 for similar episode.  Chest x-ray showed pulmonary edema.  Requiring BiPAP.   Today's Discussion 11/04/2023  *Please note that this is a verbal dictation therefore any spelling or grammatical errors are due to the Dragon Medical One system interpretation.  Chart reviewed inclusive of vital signs, progress notes, laboratory results, and diagnostic images.   I met with Favour at bedside this morning. She is awake and alert. She is not in any distress this morning. She continues to request the desire to transition to home. She and I reviewed again that her family has discussed the idea of going to [name] to see if she is able to make some degree of functional improvement. If she is unable to make desired improvements then transitioning to hospice and being treated during EOL journey is the next step.   Patient and her husband are in agreement with the plan as of presently.   Questions and concerns addressed/Palliative Support Provided.   Objective Assessment: Vital Signs Vitals:   11/04/23 0237 11/04/23 0753  BP: (!) 116/48   Pulse: 88   Resp: 16   Temp: 98.1 F (36.7 C)   SpO2:  93%    Intake/Output Summary (Last 24 hours) at 11/04/2023 9157 Last data filed at 11/04/2023 0415 Gross per 24 hour  Intake 550.24 ml  Output 1350 ml  Net -799.76 ml   Last Weight  Most recent update: 10/31/2023  1:08 AM    Weight  114.4 kg (252 lb 3.3 oz)            Gen:  Elderly Caucasian F chronically ill appearing HEENT: moist  mucous membranes CV: Regular rate and rhythm  PULM:  On 4LPM [place], breathing is nonlabored ABD: soft/nontender  EXT:  Multiple areas of ecchymosis on BUE, (+) BLE dependent edema Neuro: Alert and oriented x2  SUMMARY OF RECOMMENDATIONS   DNAR/DNI  Allowing time for outcomes  Coordination of OP Palliative support on discharge  Plan for transition to [name] SNF once medically optimized  Ongoing PMT support as needed ______________________________________________________________________________________ Rosaline [place] Palliative Medicine Team Team Cell Phone: [phone] Please utilize secure chat with additional questions, if there is no response within 30 minutes please call the above phone number  MDM: Low  Palliative Medicine Team providers are available by phone from 7am to 7pm daily and can be reached through the team cell phone.  Should this patient require assistance outside of these hours, please call the patient's attending physician.

## 2023-11-04 NOTE — Progress Notes (Signed)
 Physical Therapy Treatment Patient Details Name: RIFKA RAMEY MRN: [id number] DOB: Nov 24, 1950 Today's Date: 11/04/2023   History of Present Illness 73 y.o female admitted 10/30/23 with AMS, CAP. 8/22 hypokalemia. PMH: admission 7/11-7/18 with D/C to SNF, return home 8/3, readmit 8/7-8/15 with AMS. CHF, OSA, COPD, hypothyroidism, obesity, chronic respiratory failure on 4L, depression,chronic back pain, HTN    PT Comments  Pt pleasant, oriented to place, self and year. Pt following single step commands and able to progress to standing and pivot to chair with +2 assist. Pt with significant improvement from prior sessions and maintaining SPO2 >88% on 4L. Pt educated for transfers and HEP with spouse present throughout session. Patient will benefit from continued inpatient follow up therapy, <3 hours/day     If plan is discharge home, recommend the following: Two people to help with walking and/or transfers;Assist for transportation;Supervision due to cognitive status;Two people to help with bathing/dressing/bathroom;Direct supervision/assist for financial management;Direct supervision/assist for medications management;Other (comment)   Can travel by private vehicle     No  Equipment Recommendations  Hoyer lift;Hospital bed    Recommendations for Other Services       Precautions / Restrictions Precautions Precautions: Fall;Other (comment) Recall of Precautions/Restrictions: Impaired Precaution/Restrictions Comments: Fragile skin, edema, O2 dep     Mobility  Bed Mobility Overal bed mobility: Needs Assistance Bed Mobility: Rolling, Sidelying to Sit Rolling: Mod assist Sidelying to sit: Mod assist, +2 for physical assistance, Used rails       General bed mobility comments: mod assist to roll right x 2, and left x 1 use of rail with cues for sequence. Mod +2 to bring legs off EOB and elevate trunk from surface    Transfers Overall transfer level: Needs assistance   Transfers:  Sit to/from Stand, Bed to chair/wheelchair/BSC Sit to Stand: Mod assist, +2 physical assistance Stand pivot transfers: Mod assist, +2 physical assistance         General transfer comment: mod +2 with knees blocked and use of pad, pt able to clear sacrum holding tech and therapist and step from bed to chair. Pt stood 2nd trial from recliner with cues for hand placement and safety. mod assist to scoot fully back in chair    Ambulation/Gait                   Stairs             Wheelchair Mobility     Tilt Bed    Modified Rankin (Stroke Patients Only)       Balance Overall balance assessment: Needs assistance Sitting-balance support: No upper extremity supported, Feet supported Sitting balance-Leahy Scale: Fair Sitting balance - Comments: EOB with CGA   Standing balance support: Bilateral upper extremity supported, During functional activity Standing balance-Leahy Scale: Poor Standing balance comment: knees blocked and +2 assist                            Communication Communication Communication: No apparent difficulties  Cognition Arousal: Alert Behavior During Therapy: WFL for tasks assessed/performed   PT - Cognitive impairments: Awareness, Memory, Problem solving, Safety/Judgement, Orientation   Orientation impairments: Time, Situation                   PT - Cognition Comments: pt oriented to self, spouse, place, month and year Following commands: Impaired Following commands impaired: Follows one step commands with increased time, Follows one step commands inconsistently  Cueing Cueing Techniques: Verbal cues, Tactile cues, Gestural cues  Exercises General Exercises - Lower Extremity Long Arc Quad: AROM, Both, 10 reps, Seated, Strengthening Hip Flexion/Marching: AROM, Both, 10 reps, Seated, Strengthening    General Comments        Pertinent Vitals/Pain Pain Assessment Pain Score: 3  Pain Location: legs with  movement Pain Descriptors / Indicators: Discomfort, Aching, Sore Pain Intervention(s): Limited activity within patient's tolerance, Monitored during session, Repositioned    Home Living                          Prior Function            PT Goals (current goals can now be found in the care plan section) Progress towards PT goals: Progressing toward goals    Frequency    Min 2X/week      PT Plan      Co-evaluation              AM-PAC PT 6 Clicks Mobility   Outcome Measure  Help needed turning from your back to your side while in a flat bed without using bedrails?: A Lot Help needed moving from lying on your back to sitting on the side of a flat bed without using bedrails?: A Lot Help needed moving to and from a bed to a chair (including a wheelchair)?: Total Help needed standing up from a chair using your arms (e.g., wheelchair or bedside chair)?: Total Help needed to walk in hospital room?: Total Help needed climbing 3-5 steps with a railing? : Total 6 Click Score: 8    End of Session Equipment Utilized During Treatment: Oxygen  Activity Tolerance: Patient tolerated treatment well Patient left: in chair;with call bell/phone within reach;with family/visitor present;with chair alarm set Nurse Communication: Mobility status PT Visit Diagnosis: Muscle weakness (generalized) (M62.81);Difficulty in walking, not elsewhere classified (R26.2)     Time: 9241-9170 PT Time Calculation (min) (ACUTE ONLY): 31 min  Charges:    $Therapeutic Activity: 23-37 mins PT General Charges $$ ACUTE PT VISIT: 1 Visit                     Lenoard SQUIBB, PT Acute Rehabilitation Services Office: [phone]    Lenoard KATHEE Docker 11/04/2023, 9:36 AM

## 2023-11-04 NOTE — TOC Progression Note (Addendum)
 Transition of Care [facility]) - Progression Note    Patient Details  Name: Morgan Odonnell MRN: [id number] Date of Birth: 26-Aug-1950  [facility] CM/SW Contact  Isaiah Public, LCSWA Phone Number: 11/04/2023, 10:18 AM  Clinical Narrative:     CSW requested for CMA Jon to start the insurance authorization for patient. Patient has SNF bed at [facility]. CSW will continue to follow and assist with patients dc planning needs.  UpdateGLENWOOD Jon CMA informed CSW that patients Insurance authorization currently pending [facility] [id number].  Expected Discharge Plan: Skilled Nursing Facility Barriers to Discharge: Continued Medical Work up               Expected Discharge Plan and Services In-house Referral: Clinical Social Work     Living arrangements for the past 2 months: Single Family Home                                       Social Drivers of Health (SDOH) Interventions SDOH Screenings   Food Insecurity: No Food Insecurity (10/31/2023)  Housing: Low Risk  (10/31/2023)  Transportation Needs: Unmet Transportation Needs (10/31/2023)  Utilities: Not At Risk (10/31/2023)  Depression (PHQ2-9): Medium Risk (01/13/2019)  Social Connections: Moderately Isolated (10/31/2023)  Tobacco Use: Medium Risk (10/31/2023)    Readmission Risk Interventions    09/19/2023    1:52 PM 08/17/2023    9:31 AM  Readmission Risk Prevention Plan  Transportation Screening Complete Complete  PCP or Specialist Appt within 5-7 Days  Complete  PCP or Specialist Appt within 3-5 Days Complete   Home Care Screening  Complete  Medication Review (RN CM)  Complete  HRI or Home Care Consult Complete   Social Work Consult for Recovery Care Planning/Counseling Complete   Palliative Care Screening Complete   Medication Review Oceanographer) Complete

## 2023-11-05 DIAGNOSIS — Z7189 Other specified counseling: Secondary | ICD-10-CM | POA: Diagnosis not present

## 2023-11-05 DIAGNOSIS — Z515 Encounter for palliative care: Secondary | ICD-10-CM | POA: Diagnosis not present

## 2023-11-05 DIAGNOSIS — J189 Pneumonia, unspecified organism: Secondary | ICD-10-CM | POA: Diagnosis not present

## 2023-11-05 NOTE — Progress Notes (Signed)
   11/05/23 2215  BiPAP/CPAP/SIPAP  $ Non-Invasive Home Ventilator  Subsequent  BiPAP/CPAP/SIPAP Pt Type Adult  BiPAP/CPAP/SIPAP Resmed  Mask Type Full face mask  Mask Size Medium  Respiratory Rate 18 breaths/min  IPAP 12 cmH20  EPAP 6 cmH2O  Flow Rate 5 lpm  Patient Home Machine No  Patient Home Mask No  Patient Home Tubing No  Auto Titrate Yes  Minimum cmH2O 6 cmH2O  Maximum cmH2O 12 cmH2O  Device Plugged into [employer] Yes

## 2023-11-05 NOTE — Progress Notes (Signed)
 PROGRESS NOTE    Morgan Odonnell  FMW:[id number] DOB: 25-Mar-1950 DOA: 10/30/2023 PCP: Pcp, No  Brief Narrative:  This 73 years old female with history of morbid obesity, sleep apnea, obesity hypoventilation, chronic hypoxemic and hypercarbic respiratory failure on 4 L oxygen  at home, BiPAP at night but does not use it, chronic diastolic heart failure, COPD, hypothyroidism, CKD3a, bedbound status brought to the ED with worsening confusion.  Noncompliance to BiPAP at home.  Discharged with similar episode on 8/15 but family unable to care for patient at home given worsening confusion in the setting of noncompliance.   Assessment & Plan:   Principal Problem:   CAP (community acquired pneumonia) Active Problems:   Acute metabolic encephalopathy   Pressure injury of skin   Acute metabolic encephalopathy >  Improving. Acute on chronic hypoxic / hypercarbic respiratory failure: Complicated by severe sleep apnea and obesity hypoventilation: -Secondary to non-compliance -Continue supportive care - Now compliant / tolerating BiPAP overnight / during the day - Hoping to wean patient off BiPAP dependence, trial to use only while sleeping , will make disposition to SNF difficult if she continues to be on as needed BiPAP - could consider LTAC. - She seems improved , appears back to her baseline mental status. - Patient only required BiPAP at night.  She is now cleared medically,  awaiting SNF placement.    Acute on chronic diastolic congestive heart failure, right-sided failure: - Not in any acute exacerbation. - Continue holding diuretics. Resume at discharge.   Suspected pneumonia: -High risk for aspiration given mental status changes. -Continued  empiric coverage with Zosyn , vancomycin  given recent hospitalization and baseline respiratory status. -She completed total 5 days of antibiotics. -Patient does not meet sepsis criteria.   AKI on CKD stage IIIa : - carries former diagnosis of  CKD 3A. - Renal functions improving and back to baseline.  Hypokalemia: Potassium replaced.  Continue to monitor.   COPD without acute exacerbation: Continue nebulizer/inhaler therapy .  Hypothyroidism: Continue Synthroid .  Bilateral leg lymphedema: Chronic. Continue supportive care.   Goals of care: Discussed with patient and husband at bedside at length  given recurrent hospitalizations,  worsening mental status and noncompliance,  patient remains a remarkably high risk for decompensation. Palliative care is consulted. Patient still hopeful for recovery and will likely dispo to SNF per family discussion but ultimately would like to end up at home with family.   Morbid obesity : Body mass index is 46.13 kg/m. Diet and Exercise discussed in detail.  DVT prophylaxis: Lovenox  Code Status: DNR Family Communication: Husband At bedside. Disposition Plan:    Status is: Inpatient Remains inpatient appropriate because:   Patient now medically clear awaiting insurance authorization for SNF placement.  Consultants:  Palliative care  Procedures: None  Antimicrobials:  Anti-infectives (From admission, onward)    Start     Dose/Rate Route Frequency Ordered Stop   11/04/23 1400  levofloxacin  (LEVAQUIN ) tablet 750 mg        750 mg Oral  Once 11/04/23 1302 11/04/23 1451   11/01/23 2200  vancomycin  (VANCOREADY) IVPB 1750 mg/350 mL  Status:  Discontinued        1,750 mg 175 mL/hr over 120 Minutes Intravenous Every 48 hours 10/31/23 0333 11/04/23 1302   10/31/23 0600  piperacillin -tazobactam (ZOSYN ) IVPB 3.375 g  Status:  Discontinued        3.375 g 12.5 mL/hr over 240 Minutes Intravenous Every 8 hours 10/31/23 0118 11/04/23 1302   10/30/23 2215  piperacillin -tazobactam (ZOSYN ) IVPB 3.375  g        3.375 g 100 mL/hr over 30 Minutes Intravenous  Once 10/30/23 2200 10/30/23 2241   10/30/23 2215  vancomycin  (VANCOREADY) IVPB 1750 mg/350 mL        1,750 mg 175 mL/hr over 120 Minutes  Intravenous  Once 10/30/23 2200 10/31/23 0153      Subjective: Patient was seen and examined at bedside. Overnight events noted. Patient was lying comfortably in the bed.  Her BiPAP DME has been approved. Husband at bedside, states he is not able to take care of her given due to noncompliance.  Objective: Vitals:   11/05/23 0500 11/05/23 0741 11/05/23 0815 11/05/23 0900  BP: (!) 125/53 129/74    Pulse:      Resp:  19    Temp: 98.9 F (37.2 C) 98.7 F (37.1 C)    TempSrc: Axillary Oral    SpO2:  92% 94% 93%  Weight:      Height:        Intake/Output Summary (Last 24 hours) at 11/05/2023 1153 Last data filed at 11/05/2023 1100 Gross per 24 hour  Intake 485.24 ml  Output 1500 ml  Net -1014.76 ml   Filed Weights   10/31/23 0107  Weight: 114.4 kg    Examination:  General exam: Appears calm and comfortable, not in any acute distress.  Morbidly obese. Respiratory system: CTA Bilaterally . Respiratory effort normal.  RR 15 Cardiovascular system: S1 & S2 heard, RRR. No JVD, murmurs, rubs, gallops or clicks.  Gastrointestinal system: Abdomen is non distended, soft and nontender. Normal bowel sounds heard. Central nervous system: Alert and oriented x 3. No focal neurological deficits. Extremities: Edema+, no cyanosis, no clubbing Skin: No rashes, lesions or ulcers Psychiatry: Judgement and insight appear normal. Mood & affect appropriate.    Data Reviewed: I have personally reviewed following labs and imaging studies  CBC: Recent Labs  Lab 10/30/23 2045 10/30/23 2058 10/31/23 0011 10/31/23 0521 11/01/23 0508 11/02/23 0535 11/04/23 0648  WBC 9.7  --   --  9.8 8.7 9.1 10.3  NEUTROABS 6.6  --   --   --  5.0  --   --   HGB 12.6   < > 11.2* 11.3* 10.3* 10.4* 10.1*  HCT 40.0   < > 33.0* 35.1* 31.8* 33.0* 32.7*  MCV 89.3  --   --  85.0 85.9 86.8 88.1  PLT 252  --   --  255 257 265 201   < > = values in this interval not displayed.   Basic Metabolic Panel: Recent Labs   Lab 10/30/23 2045 10/30/23 2058 10/31/23 0521 10/31/23 2225 11/01/23 0508 11/02/23 0535 11/04/23 0648  NA 136   < > 137 135 135 137 137  K 3.5   < > 2.7* 4.1 3.5 3.1* 3.6  CL 97*  --  100 100 104 102 105  CO2 27  --  27 26 27 27 23   GLUCOSE 111*  --  83 82 78 88 83  BUN 24*  --  22 23 20 14 12   CREATININE 1.39*  --  1.29* 1.37* 1.36* 1.59* 1.33*  CALCIUM  9.9  --  9.3 8.8* 8.5* 8.8* 9.0  MG 1.8  --  1.7  --  1.7  --  1.7  PHOS  --   --   --   --   --   --  3.4   < > = values in this interval not displayed.   GFR: Estimated Creatinine  Clearance: 46.4 mL/min (A) (by C-G formula based on SCr of 1.33 mg/dL (H)). Liver Function Tests: Recent Labs  Lab 10/30/23 2045 10/31/23 0521  AST 39 29  ALT 19 16  ALKPHOS 91 75  BILITOT 1.4* 1.6*  PROT 8.2* 7.3  ALBUMIN 2.6* 2.2*   No results for input(s): LIPASE, AMYLASE in the last 168 hours. No results for input(s): AMMONIA in the last 168 hours. Coagulation Profile: No results for input(s): INR, PROTIME in the last 168 hours. Cardiac Enzymes: Recent Labs  Lab 10/30/23 2045  CKTOTAL 40   BNP (last 3 results) No results for input(s): PROBNP in the last 8760 hours. HbA1C: No results for input(s): HGBA1C in the last 72 hours. CBG: No results for input(s): GLUCAP in the last 168 hours. Lipid Profile: No results for input(s): CHOL, HDL, LDLCALC, TRIG, CHOLHDL, LDLDIRECT in the last 72 hours. Thyroid  Function Tests: No results for input(s): TSH, T4TOTAL, FREET4, T3FREE, THYROIDAB in the last 72 hours. Anemia Panel: No results for input(s): VITAMINB12, FOLATE, FERRITIN, TIBC, IRON, RETICCTPCT in the last 72 hours. Sepsis Labs: Recent Labs  Lab 10/30/23 2103 10/31/23 0521  PROCALCITON  --  0.10  LATICACIDVEN 1.7  --     Recent Results (from the past 240 hours)  Blood culture (routine x 2)     Status: None   Collection Time: 10/30/23  8:59 PM   Specimen: BLOOD  Result  Value Ref Range Status   Specimen Description BLOOD RIGHT ANTECUBITAL  Final   Special Requests   Final    BOTTLES DRAWN AEROBIC AND ANAEROBIC Blood Culture results may not be optimal due to an inadequate volume of blood received in culture bottles   Culture   Final    NO GROWTH 5 DAYS Performed at [facility], [address] [place], [place] [address]    Report Status 11/04/2023 FINAL  Final  Blood culture (routine x 2)     Status: None   Collection Time: 10/30/23  8:59 PM   Specimen: BLOOD  Result Value Ref Range Status   Specimen Description BLOOD LEFT ANTECUBITAL  Final   Special Requests   Final    BOTTLES DRAWN AEROBIC AND ANAEROBIC Blood Culture results may not be optimal due to an inadequate volume of blood received in culture bottles   Culture   Final    NO GROWTH 5 DAYS Performed at [facility], [address], [place], [place] [address]    Report Status 11/04/2023 FINAL  Final  Urine Culture     Status: Abnormal   Collection Time: 10/30/23 11:40 PM   Specimen: Urine, Catheterized  Result Value Ref Range Status   Specimen Description URINE, CATHETERIZED  Final   Special Requests   Final    NONE Performed at [facility], [address] [place], [place] [address]    Culture >=100,000 COLONIES/mL STAPHYLOCOCCUS EPIDERMIDIS (A)  Final   Report Status 11/01/2023 FINAL  Final   Organism ID, Bacteria STAPHYLOCOCCUS EPIDERMIDIS (A)  Final      Susceptibility   Staphylococcus epidermidis - MIC*    CIPROFLOXACIN >=8 RESISTANT Resistant     GENTAMICIN <=0.5 SENSITIVE Sensitive     NITROFURANTOIN <=16 SENSITIVE Sensitive     OXACILLIN >=4 RESISTANT Resistant     TETRACYCLINE 2 SENSITIVE Sensitive     VANCOMYCIN  <=0.5 SENSITIVE Sensitive     TRIMETH/SULFA 160 RESISTANT Resistant     RIFAMPIN <=0.5 SENSITIVE Sensitive     Inducible  Clindamycin NEGATIVE Sensitive     * >=100,000 COLONIES/mL STAPHYLOCOCCUS EPIDERMIDIS  Gastrointestinal Panel by PCR ,  Stool     Status: None   Collection Time: 11/01/23  7:39 AM   Specimen: Stool  Result Value Ref Range Status   Campylobacter species NOT DETECTED NOT DETECTED Final   Plesimonas shigelloides NOT DETECTED NOT DETECTED Final   Salmonella species NOT DETECTED NOT DETECTED Final   Yersinia enterocolitica NOT DETECTED NOT DETECTED Final   Vibrio species NOT DETECTED NOT DETECTED Final   Vibrio cholerae NOT DETECTED NOT DETECTED Final   Enteroaggregative E coli (EAEC) NOT DETECTED NOT DETECTED Final   Enteropathogenic E coli (EPEC) NOT DETECTED NOT DETECTED Final   Enterotoxigenic E coli (ETEC) NOT DETECTED NOT DETECTED Final   Shiga like toxin producing E coli (STEC) NOT DETECTED NOT DETECTED Final   Shigella/Enteroinvasive E coli (EIEC) NOT DETECTED NOT DETECTED Final   Cryptosporidium NOT DETECTED NOT DETECTED Final   Cyclospora cayetanensis NOT DETECTED NOT DETECTED Final   Entamoeba histolytica NOT DETECTED NOT DETECTED Final   Giardia lamblia NOT DETECTED NOT DETECTED Final   Adenovirus F40/41 NOT DETECTED NOT DETECTED Final   Astrovirus NOT DETECTED NOT DETECTED Final   Norovirus GI/GII NOT DETECTED NOT DETECTED Final   Rotavirus A NOT DETECTED NOT DETECTED Final   Sapovirus (I, II, IV, and V) NOT DETECTED NOT DETECTED Final    Comment: Performed at [facility], [address], [place], [place] [address]    Radiology Studies: No results found.  Scheduled Meds:  aspirin  EC  81 mg Oral Daily   baclofen   10 mg Oral TID   cholecalciferol   5,000 Units Oral Daily   citalopram   20 mg Oral Daily   diclofenac  Sodium  2 g Topical QID   feeding supplement  237 mL Oral BID BM   fluticasone  furoate-vilanterol  1 puff Inhalation Daily   Gerhardt's butt cream   Topical BID   heparin  injection (subcutaneous)  5,000 Units Subcutaneous Q8H   levothyroxine   200 mcg Oral QAC breakfast   spironolactone   25 mg Oral Daily   umeclidinium bromide   1 puff Inhalation Daily   Continuous  Infusions:     LOS: 5 days    Time spent: 35 Mins    Darcel Dawley, MD [facility]   If 7PM-7AM, please contact night-coverage

## 2023-11-05 NOTE — Plan of Care (Signed)
  Problem: Clinical Measurements: Goal: Ability to maintain clinical measurements within normal limits will improve Outcome: Progressing Goal: Will remain free from infection Outcome: Progressing   Problem: Coping: Goal: Level of anxiety will decrease Outcome: Progressing   Problem: Elimination: Goal: Will not experience complications related to bowel motility Outcome: Progressing   Problem: Pain Managment: Goal: General experience of comfort will improve and/or be controlled Outcome: Progressing   Problem: Safety: Goal: Ability to remain free from injury will improve Outcome: Progressing   Problem: Skin Integrity: Goal: Risk for impaired skin integrity will decrease Outcome: Progressing   Problem: Education: Goal: Knowledge of General Education information will improve Description: Including pain rating scale, medication(s)/side effects and non-pharmacologic comfort measures Outcome: Not Met (add Reason) Note: On-going   Problem: Health Behavior/Discharge Planning: Goal: Ability to manage health-related needs will improve Outcome: Not Met (add Reason) Note: On-going   Problem: Activity: Goal: Risk for activity intolerance will decrease Outcome: Not Met (add Reason) Note: On-going

## 2023-11-05 NOTE — TOC Progression Note (Addendum)
 Transition of Care [facility]) - Progression Note    Patient Details  Name: Morgan Odonnell MRN: [id number] Date of Birth: October 08, 1950  Transition of [facility]) CM/SW Contact  Isaiah Public, LCSWA Phone Number: 11/05/2023, 8:55 AM  Clinical Narrative:     Patients insurance authorization for SNF currently pending. Patient has SNF bed at [facility]. CSW will continue to follow.  Update- Jon CMA informed CSW that patients insurance authorization went to a peer to peer for SNF. CSW informed MD.Peer to peer is Due by 4:00 pm today(Wed) @[phone] option #[id number].  Expected Discharge Plan: Skilled Nursing Facility Barriers to Discharge: Continued Medical Work up               Expected Discharge Plan and Services In-house Referral: Clinical Social Work     Living arrangements for the past 2 months: Single Family Home                                       Social Drivers of Health (SDOH) Interventions SDOH Screenings   Food Insecurity: No Food Insecurity (10/31/2023)  Housing: Low Risk  (10/31/2023)  Transportation Needs: Unmet Transportation Needs (10/31/2023)  Utilities: Not At Risk (10/31/2023)  Depression (PHQ2-9): Medium Risk (01/13/2019)  Social Connections: Moderately Isolated (10/31/2023)  Tobacco Use: Medium Risk (10/31/2023)    Readmission Risk Interventions    09/19/2023    1:52 PM 08/17/2023    9:31 AM  Readmission Risk Prevention Plan  Transportation Screening Complete Complete  PCP or Specialist Appt within 5-7 Days  Complete  PCP or Specialist Appt within 3-5 Days Complete   Home Care Screening  Complete  Medication Review (RN CM)  Complete  HRI or Home Care Consult Complete   Social Work Consult for Recovery Care Planning/Counseling Complete   Palliative Care Screening Complete   Medication Review Oceanographer) Complete

## 2023-11-05 NOTE — Progress Notes (Signed)
 Patient is asleep in bed with visible chest rise and fall with husband at bedside.  Safety measures are in place, call light is within reach, and 4P's addressed.

## 2023-11-05 NOTE — Progress Notes (Signed)
   Palliative Medicine Inpatient Follow Up Note HPI: Palliative Care consult requested for goals of care discussion in this 73 y.o. female  with past medical history of chronic hypoxic and hypercarbic respiratory failure (4 L home oxygen ), nocturnal BiPAP however patient is not using, diastolic heart failure, hypothyroidism, COPD, obesity, sleep apnea, bedbound.  She was admitted on 10/30/2023 from home with worsening confusion.  Patient recently discharged on 8/15 for similar episode.  Chest x-ray showed pulmonary edema.  Requiring BiPAP.   Today's Discussion 11/05/2023  *Please note that this is a verbal dictation therefore any spelling or grammatical errors are due to the Dragon Medical One system interpretation.  Chart reviewed inclusive of vital signs, progress notes, laboratory results, and diagnostic images.   Per lab review K improved and Cr 1.33/BUN 12  I met with Maysa at bedside this morning in the company of her spouse, [name]. She is awake this morning and fully oriented. She shares with her spouse again, the desire to go home. It has been emphasized that she is unable to go home due to her generalized deconditioning. We reviewed that at this point in time it is important for Ashlei to gain strength in an effort to go home. Patients spouse at this point in time is not able to care for her 24/7 in the home.  [name] is experiencing caregiver fatigue at this time. He expressed complete exhaustion and inability right now to care for [facility].   Again, the hope is [name] will exemplify that Hallel is able to improve or not. If she is not able to improve the idea will be for her to transition to hospice. Estha's hope and goal is to get to her home again.   Questions and concerns addressed/Palliative Support Provided.   Objective Assessment: Vital Signs Vitals:   11/05/23 0500 11/05/23 0741  BP: (!) 125/53 129/74  Pulse:    Resp:  19  Temp: 98.9 F (37.2 C) 98.7  F (37.1 C)  SpO2:  92%    Intake/Output Summary (Last 24 hours) at 11/05/2023 9044 Last data filed at 11/05/2023 0500 Gross per 24 hour  Intake 50.04 ml  Output 1500 ml  Net -1449.96 ml   Last Weight  Most recent update: 10/31/2023  1:08 AM    Weight  114.4 kg (252 lb 3.3 oz)            Gen:  Elderly Caucasian F chronically ill appearing HEENT: moist mucous membranes CV: Regular rate and rhythm  PULM:  On 4LPM [place], breathing is nonlabored ABD: soft/nontender  EXT:  Multiple areas of ecchymosis on BUE, (+) BLE dependent edema Neuro: Alert and oriented x2  SUMMARY OF RECOMMENDATIONS   DNAR/DNI  Allowing time for outcomes  Coordination of OP Palliative support on discharge  Plan for transition to [name] SNF once medically optimized  Ongoing PMT support as needed ______________________________________________________________________________________ Rosaline Becton [place] Palliative Medicine Team Team Cell Phone: [phone] Please utilize secure chat with additional questions, if there is no response within 30 minutes please call the above phone number  MDM: Moderate  Palliative Medicine Team providers are available by phone from 7am to 7pm daily and can be reached through the team cell phone.  Should this patient require assistance outside of these hours, please call the patient's attending physician.

## 2023-11-06 DIAGNOSIS — J189 Pneumonia, unspecified organism: Secondary | ICD-10-CM | POA: Diagnosis not present

## 2023-11-06 LAB — CBC
HCT: 30.4 % — ABNORMAL LOW (ref 36.0–46.0)
Hemoglobin: 9.6 g/dL — ABNORMAL LOW (ref 12.0–15.0)
MCH: 27.4 pg (ref 26.0–34.0)
MCHC: 31.6 g/dL (ref 30.0–36.0)
MCV: 86.9 fL (ref 80.0–100.0)
Platelets: 168 K/uL (ref 150–400)
RBC: 3.5 MIL/uL — ABNORMAL LOW (ref 3.87–5.11)
RDW: 16.8 % — ABNORMAL HIGH (ref 11.5–15.5)
WBC: 9.5 K/uL (ref 4.0–10.5)
nRBC: 0 % (ref 0.0–0.2)

## 2023-11-06 LAB — BASIC METABOLIC PANEL WITH GFR
Anion gap: 8 (ref 5–15)
BUN: 12 mg/dL (ref 8–23)
CO2: 24 mmol/L (ref 22–32)
Calcium: 9.4 mg/dL (ref 8.9–10.3)
Chloride: 105 mmol/L (ref 98–111)
Creatinine, Ser: 1.22 mg/dL — ABNORMAL HIGH (ref 0.44–1.00)
GFR, Estimated: 47 mL/min — ABNORMAL LOW (ref >60)
Glucose, Bld: 93 mg/dL (ref 70–99)
Potassium: 3.5 mmol/L (ref 3.5–5.1)
Sodium: 137 mmol/L (ref 135–145)

## 2023-11-06 NOTE — Progress Notes (Signed)
 PROGRESS NOTE    Morgan Odonnell  FMW:[id number] DOB: [birth year] DOA: 10/30/2023 PCP: Pcp, No  Brief Narrative:  This 73 years old female with history of morbid obesity, sleep apnea, obesity hypoventilation, chronic hypoxemic and hypercarbic respiratory failure on 4 L oxygen  at home, BiPAP at night but does not use it, chronic diastolic heart failure, COPD, hypothyroidism, CKD3a, bedbound status brought to the ED with worsening confusion.  Noncompliance to BiPAP at home.  Discharged with similar episode on 8/15 but family unable to care for patient at home given worsening confusion in the setting of noncompliance.   Assessment & Plan:   Principal Problem:   CAP (community acquired pneumonia) Active Problems:   Acute metabolic encephalopathy   Pressure injury of skin   Acute metabolic encephalopathy >  Improving. Acute on chronic hypoxic / hypercarbic respiratory failure: Complicated by severe sleep apnea and obesity hypoventilation: -Secondary to non-compliance -Continue supportive care - Now compliant / tolerating BiPAP overnight / during the day - Hoping to wean patient off BiPAP dependence, trial to use only while sleeping , will make disposition to SNF difficult if she continues to be on as needed BiPAP - could consider LTAC. - She seems improved , appears back to her baseline mental status. - Patient only required BiPAP at night.  She is now cleared medically,  awaiting SNF placement.    Acute on chronic diastolic congestive heart failure, right-sided failure: - Not in any acute exacerbation. - Continue holding diuretics. Resume at discharge.   Suspected pneumonia: -High risk for aspiration given mental status changes. -Continued  empiric coverage with Zosyn , vancomycin  given recent hospitalization and baseline respiratory status. -She completed total 5 days of antibiotics. -Patient does not meet sepsis criteria.   AKI on CKD stage IIIa : - carries former diagnosis of  CKD 3A. - Renal functions improving and back to baseline.  Hypokalemia: Potassium replaced.  Continue to monitor.   COPD without acute exacerbation: Continue nebulizer/inhaler therapy .  Hypothyroidism: Continue Synthroid .  Bilateral leg lymphedema: Chronic. Continue supportive care.   Goals of care: Discussed with patient and husband at bedside at length  given recurrent hospitalizations,  worsening mental status and noncompliance,  patient remains a remarkably high risk for decompensation. Palliative care is consulted. Patient still hopeful for recovery and will likely dispo to SNF per family discussion but ultimately would like to end up at home with family.   Morbid obesity : Body mass index is 46.13 kg/m. Diet and Exercise discussed in detail.  DVT prophylaxis: Lovenox  Code Status: DNR Family Communication: Husband At bedside. Disposition Plan:    Status is: Inpatient Remains inpatient appropriate because:   Patient now medically clear, awaiting insurance authorization for SNF placement.  Consultants:  Palliative care  Procedures: None  Antimicrobials:  Anti-infectives (From admission, onward)    Start     Dose/Rate Route Frequency Ordered Stop   11/04/23 1400  levofloxacin  (LEVAQUIN ) tablet 750 mg        750 mg Oral  Once 11/04/23 1302 11/04/23 1451   11/01/23 2200  vancomycin  (VANCOREADY) IVPB 1750 mg/350 mL  Status:  Discontinued        1,750 mg 175 mL/hr over 120 Minutes Intravenous Every 48 hours 10/31/23 0333 11/04/23 1302   10/31/23 0600  piperacillin -tazobactam (ZOSYN ) IVPB 3.375 g  Status:  Discontinued        3.375 g 12.5 mL/hr over 240 Minutes Intravenous Every 8 hours 10/31/23 0118 11/04/23 1302   10/30/23 2215  piperacillin -tazobactam (ZOSYN ) IVPB 3.375  g        3.375 g 100 mL/hr over 30 Minutes Intravenous  Once 10/30/23 2200 10/30/23 2241   10/30/23 2215  vancomycin  (VANCOREADY) IVPB 1750 mg/350 mL        1,750 mg 175 mL/hr over 120 Minutes  Intravenous  Once 10/30/23 2200 10/31/23 0153      Subjective: Patient was seen and examined at bedside. Overnight events noted. Patient was lying comfortably in the bed.  Her BiPAP DME has been approved. Husband at bedside, states he is not able to take care of her given due to noncompliance. She denies any other concerns.  Objective: Vitals:   11/05/23 2215 11/05/23 2313 11/06/23 0340 11/06/23 0908  BP:  (!) 123/47 (!) 108/55 (!) 122/58  Pulse: 86 84 78   Resp: 16 16 16    Temp:  97.8 F (36.6 C) 97.7 F (36.5 C) 97.7 F (36.5 C)  TempSrc:  Oral Oral Oral  SpO2: 96% 91% 92% 91%  Weight:      Height:        Intake/Output Summary (Last 24 hours) at 11/06/2023 1215 Last data filed at 11/06/2023 0900 Gross per 24 hour  Intake 837 ml  Output 2450 ml  Net -1613 ml   Filed Weights   10/31/23 0107  Weight: 114.4 kg    Examination:  General exam: Appears calm and comfortable, not in any acute distress.  Morbidly obese. Respiratory system: CTA Bilaterally . Respiratory effort normal.  RR 15 Cardiovascular system: S1 & S2 heard, RRR. No JVD, murmurs, rubs, gallops or clicks.  Gastrointestinal system: Abdomen is non distended, soft and nontender. Normal bowel sounds heard. Central nervous system: Alert and oriented x 3. No focal neurological deficits. Extremities: Edema+, no cyanosis, no clubbing Skin: No rashes, lesions or ulcers Psychiatry: Judgement and insight appear normal. Mood & affect appropriate.    Data Reviewed: I have personally reviewed following labs and imaging studies  CBC: Recent Labs  Lab 10/30/23 2045 10/30/23 2058 10/31/23 0521 11/01/23 0508 11/02/23 0535 11/04/23 0648 11/06/23 0518  WBC 9.7  --  9.8 8.7 9.1 10.3 9.5  NEUTROABS 6.6  --   --  5.0  --   --   --   HGB 12.6   < > 11.3* 10.3* 10.4* 10.1* 9.6*  HCT 40.0   < > 35.1* 31.8* 33.0* 32.7* 30.4*  MCV 89.3  --  85.0 85.9 86.8 88.1 86.9  PLT 252  --  255 257 265 201 168   < > = values in  this interval not displayed.   Basic Metabolic Panel: Recent Labs  Lab 10/30/23 2045 10/30/23 2058 10/31/23 0521 10/31/23 2225 11/01/23 0508 11/02/23 0535 11/04/23 0648 11/06/23 0518  NA 136   < > 137 135 135 137 137 137  K 3.5   < > 2.7* 4.1 3.5 3.1* 3.6 3.5  CL 97*  --  100 100 104 102 105 105  CO2 27  --  27 26 27 27 23 24   GLUCOSE 111*  --  83 82 78 88 83 93  BUN 24*  --  22 23 20 14 12 12   CREATININE 1.39*  --  1.29* 1.37* 1.36* 1.59* 1.33* 1.22*  CALCIUM  9.9  --  9.3 8.8* 8.5* 8.8* 9.0 9.4  MG 1.8  --  1.7  --  1.7  --  1.7  --   PHOS  --   --   --   --   --   --  3.4  --    < > = values in this interval not displayed.   GFR: Estimated Creatinine Clearance: 50.6 mL/min (A) (by C-G formula based on SCr of 1.22 mg/dL (H)). Liver Function Tests: Recent Labs  Lab 10/30/23 2045 10/31/23 0521  AST 39 29  ALT 19 16  ALKPHOS 91 75  BILITOT 1.4* 1.6*  PROT 8.2* 7.3  ALBUMIN 2.6* 2.2*   No results for input(s): LIPASE, AMYLASE in the last 168 hours. No results for input(s): AMMONIA in the last 168 hours. Coagulation Profile: No results for input(s): INR, PROTIME in the last 168 hours. Cardiac Enzymes: Recent Labs  Lab 10/30/23 2045  CKTOTAL 40   BNP (last 3 results) No results for input(s): PROBNP in the last 8760 hours. HbA1C: No results for input(s): HGBA1C in the last 72 hours. CBG: No results for input(s): GLUCAP in the last 168 hours. Lipid Profile: No results for input(s): CHOL, HDL, LDLCALC, TRIG, CHOLHDL, LDLDIRECT in the last 72 hours. Thyroid  Function Tests: No results for input(s): TSH, T4TOTAL, FREET4, T3FREE, THYROIDAB in the last 72 hours. Anemia Panel: No results for input(s): VITAMINB12, FOLATE, FERRITIN, TIBC, IRON, RETICCTPCT in the last 72 hours. Sepsis Labs: Recent Labs  Lab 10/30/23 2103 10/31/23 0521  PROCALCITON  --  0.10  LATICACIDVEN 1.7  --     Recent Results (from the past  240 hours)  Blood culture (routine x 2)     Status: None   Collection Time: 10/30/23  8:59 PM   Specimen: BLOOD  Result Value Ref Range Status   Specimen Description BLOOD RIGHT ANTECUBITAL  Final   Special Requests   Final    BOTTLES DRAWN AEROBIC AND ANAEROBIC Blood Culture results may not be optimal due to an inadequate volume of blood received in culture bottles   Culture   Final    NO GROWTH 5 DAYS Performed at [facility], [address], [place], [place] [address]    Report Status 11/04/2023 FINAL  Final  Blood culture (routine x 2)     Status: None   Collection Time: 10/30/23  8:59 PM   Specimen: BLOOD  Result Value Ref Range Status   Specimen Description BLOOD LEFT ANTECUBITAL  Final   Special Requests   Final    BOTTLES DRAWN AEROBIC AND ANAEROBIC Blood Culture results may not be optimal due to an inadequate volume of blood received in culture bottles   Culture   Final    NO GROWTH 5 DAYS Performed at [facility], [address] [place], [place] [address]    Report Status 11/04/2023 FINAL  Final  Urine Culture     Status: Abnormal   Collection Time: 10/30/23 11:40 PM   Specimen: Urine, Catheterized  Result Value Ref Range Status   Specimen Description URINE, CATHETERIZED  Final   Special Requests   Final    NONE Performed at [facility], [address] [place], [place] [address]    Culture >=100,000 COLONIES/mL STAPHYLOCOCCUS EPIDERMIDIS (A)  Final   Report Status 11/01/2023 FINAL  Final   Organism ID, Bacteria STAPHYLOCOCCUS EPIDERMIDIS (A)  Final      Susceptibility   Staphylococcus epidermidis - MIC*    CIPROFLOXACIN >=8 RESISTANT Resistant     GENTAMICIN <=0.5 SENSITIVE Sensitive     NITROFURANTOIN <=16 SENSITIVE Sensitive     OXACILLIN >=4 RESISTANT Resistant     TETRACYCLINE 2 SENSITIVE Sensitive     VANCOMYCIN  <=0.5 SENSITIVE Sensitive  TRIMETH/SULFA 160 RESISTANT Resistant     RIFAMPIN <=0.5 SENSITIVE Sensitive     Inducible  Clindamycin NEGATIVE Sensitive     * >=100,000 COLONIES/mL STAPHYLOCOCCUS EPIDERMIDIS  Gastrointestinal Panel by PCR , Stool     Status: None   Collection Time: 11/01/23  7:39 AM   Specimen: Stool  Result Value Ref Range Status   Campylobacter species NOT DETECTED NOT DETECTED Final   Plesimonas shigelloides NOT DETECTED NOT DETECTED Final   Salmonella species NOT DETECTED NOT DETECTED Final   Yersinia enterocolitica NOT DETECTED NOT DETECTED Final   Vibrio species NOT DETECTED NOT DETECTED Final   Vibrio cholerae NOT DETECTED NOT DETECTED Final   Enteroaggregative E coli (EAEC) NOT DETECTED NOT DETECTED Final   Enteropathogenic E coli (EPEC) NOT DETECTED NOT DETECTED Final   Enterotoxigenic E coli (ETEC) NOT DETECTED NOT DETECTED Final   Shiga like toxin producing E coli (STEC) NOT DETECTED NOT DETECTED Final   Shigella/Enteroinvasive E coli (EIEC) NOT DETECTED NOT DETECTED Final   Cryptosporidium NOT DETECTED NOT DETECTED Final   Cyclospora cayetanensis NOT DETECTED NOT DETECTED Final   Entamoeba histolytica NOT DETECTED NOT DETECTED Final   Giardia lamblia NOT DETECTED NOT DETECTED Final   Adenovirus F40/41 NOT DETECTED NOT DETECTED Final   Astrovirus NOT DETECTED NOT DETECTED Final   Norovirus GI/GII NOT DETECTED NOT DETECTED Final   Rotavirus A NOT DETECTED NOT DETECTED Final   Sapovirus (I, II, IV, and V) NOT DETECTED NOT DETECTED Final    Comment: Performed at [facility], [address], [place], [place] [address]    Radiology Studies: No results found.  Scheduled Meds:  aspirin  EC  81 mg Oral Daily   baclofen   10 mg Oral TID   cholecalciferol   5,000 Units Oral Daily   citalopram   20 mg Oral Daily   diclofenac  Sodium  2 g Topical QID   feeding supplement  237 mL Oral BID BM   fluticasone  furoate-vilanterol  1 puff Inhalation Daily   Gerhardt's butt cream   Topical BID   heparin  injection (subcutaneous)  5,000 Units Subcutaneous Q8H   levothyroxine   200 mcg  Oral QAC breakfast   spironolactone   25 mg Oral Daily   umeclidinium bromide   1 puff Inhalation Daily   Continuous Infusions:     LOS: 6 days    Time spent: 35 Mins    Darcel Dawley, MD [facility]   If 7PM-7AM, please contact night-coverage

## 2023-11-06 NOTE — Progress Notes (Signed)
 8/28 Peer to Peer was offered and did not take place therefore, denied. The patient has Dual Medicaid SNF coverage.

## 2023-11-06 NOTE — TOC Progression Note (Addendum)
 Transition of Care [facility]) - Progression Note    Patient Details  Name: TITANIA GAULT MRN: [id number] Date of Birth: 1950/06/02  Transition of Care [facility]) CM/SW Contact  Isaiah Public, LCSWA Phone Number: 11/06/2023, 10:41 AM  Clinical Narrative:     CMA Angela informed CSW that  Peer to Peer was offered and did not take place therefore, denied. CMA informed CSW that patient has Dual Medicaid SNF coverage. CSW asked facility if able to use medicaid insurance for SNF placement. Shona is checking with buisness office and will follow back up with CSW.   Update- Rhonda with [facility] informed CSW that patient would not be able to use medicaid benefit for short term rehab, it is community medicaid. CSW informed patients son [name] on insurance denial. [name] would like to appeal. CSW provided patients son with appeal information. Telephone number- [phone] option 2, fax # [phone]. Patients son plans to call and appeal. CSW will continue to follow.   Expected Discharge Plan: Skilled Nursing Facility Barriers to Discharge: Continued Medical Work up               Expected Discharge Plan and Services In-house Referral: Clinical Social Work     Living arrangements for the past 2 months: Single Family Home                                       Social Drivers of Health (SDOH) Interventions SDOH Screenings   Food Insecurity: No Food Insecurity (10/31/2023)  Housing: Low Risk  (10/31/2023)  Transportation Needs: Unmet Transportation Needs (10/31/2023)  Utilities: Not At Risk (10/31/2023)  Depression (PHQ2-9): Medium Risk (01/13/2019)  Social Connections: Moderately Isolated (10/31/2023)  Tobacco Use: Medium Risk (10/31/2023)    Readmission Risk Interventions    09/19/2023    1:52 PM 08/17/2023    9:31 AM  Readmission Risk Prevention Plan  Transportation Screening Complete Complete  PCP or Specialist Appt within 5-7 Days  Complete  PCP or Specialist Appt  within 3-5 Days Complete   Home Care Screening  Complete  Medication Review (RN CM)  Complete  HRI or Home Care Consult Complete   Social Work Consult for Recovery Care Planning/Counseling Complete   Palliative Care Screening Complete   Medication Review Oceanographer) Complete

## 2023-11-06 NOTE — Plan of Care (Signed)

## 2023-11-06 NOTE — Progress Notes (Signed)
 Occupational Therapy Treatment Patient Details Name: JONETTE WASSEL MRN: [id number] DOB: Mar 04, 1951 Today's Date: 11/06/2023   History of present illness 73 y.o female admitted 10/30/23 with AMS, CAP. 8/22 hypokalemia. PMH: admission 7/11-7/18 with D/C to SNF, return home 8/3, readmit 8/7-8/15 with AMS. CHF, OSA, COPD, hypothyroidism, obesity, chronic respiratory failure on 4L, depression,chronic back pain, HTN   OT comments  Pt in be upon arrival with spouse present and pt agreeable to sit EOB for activity, but declined SPT to chair. Pt participated in grooming/hygiene tasks, UB ADLs and UE and LE RO exercises. Pt with O2 SATs dropping to 83% and recovering to 89% with deep, pursed lip breathing. Pt required max A +2 for sitting EOB and return to supine with use of bed controls to scoot pt to [facility]. OT will continue to follow acutely to maximize level of function and safety      If plan is discharge home, recommend the following:  Two people to help with walking and/or transfers;A lot of help with bathing/dressing/bathroom;Assist for transportation;Help with stairs or ramp for entrance;Assistance with cooking/housework   Equipment Recommendations  Hospital bed;Hoyer lift;Wheelchair cushion (measurements OT);Wheelchair (measurements OT)    Recommendations for Other Services      Precautions / Restrictions Precautions Precautions: Fall;Other (comment) Recall of Precautions/Restrictions: Impaired Precaution/Restrictions Comments: Fragile skin, edema, O2 dep Restrictions Weight Bearing Restrictions Per Provider Order: No       Mobility Bed Mobility Overal bed mobility: Needs Assistance Bed Mobility: Supine to Sit, Sit to Supine     Supine to sit: Max assist, +2 for physical assistance, HOB elevated, Used rails Sit to supine: Max assist, +2 for physical assistance        Transfers                   General transfer comment: pt declined     Balance Overall balance  assessment: Needs assistance Sitting-balance support: No upper extremity supported, Feet supported Sitting balance-Leahy Scale: Fair Sitting balance - Comments: EOB with CGA                                   ADL either performed or assessed with clinical judgement   ADL Overall ADL's : Needs assistance/impaired     Grooming: Wash/dry hands;Wash/dry face;Minimal assistance;Sitting           Upper Body Dressing : Moderate assistance;Sitting           Toileting- Clothing Manipulation and Hygiene: Total assistance;Bed level;+2 for physical assistance              Extremity/Trunk Assessment Upper Extremity Assessment Upper Extremity Assessment: Generalized weakness;Right hand dominant   Lower Extremity Assessment Lower Extremity Assessment: Defer to PT evaluation   Cervical / Trunk Assessment Cervical / Trunk Assessment: Other exceptions Cervical / Trunk Exceptions: increased body habitus    Vision Ability to See in Adequate Light: 0 Adequate Patient Visual Report: No change from baseline     Perception     Praxis     Communication Communication Communication: No apparent difficulties Factors Affecting Communication: Reduced clarity of speech   Cognition Arousal: Alert Behavior During Therapy: WFL for tasks assessed/performed                                 Following commands: Impaired Following commands impaired: Follows one step commands with  increased time, Follows one step commands inconsistently      Cueing   Cueing Techniques: Verbal cues, Tactile cues, Gestural cues  Exercises      Shoulder Instructions       General Comments      Pertinent Vitals/ Pain       Pain Assessment Pain Assessment: Faces Faces Pain Scale: Hurts little more Pain Location: LEs with movement Pain Descriptors / Indicators: Discomfort, Aching, Sore Pain Intervention(s): Limited activity within patient's tolerance, Repositioned,  Monitored during session  Home Living                                          Prior Functioning/Environment              Frequency  Min 2X/week        Progress Toward Goals  OT Goals(current goals can now be found in the care plan section)  Progress towards OT goals: Progressing toward goals     Plan      Co-evaluation                 AM-PAC OT 6 Clicks Daily Activity     Outcome Measure   Help from another person eating meals?: A Little Help from another person taking care of personal grooming?: A Little Help from another person toileting, which includes using toliet, bedpan, or urinal?: Total Help from another person bathing (including washing, rinsing, drying)?: Total Help from another person to put on and taking off regular upper body clothing?: A Lot Help from another person to put on and taking off regular lower body clothing?: Total 6 Click Score: 11    End of Session Equipment Utilized During Treatment: Oxygen   OT Visit Diagnosis: Other abnormalities of gait and mobility (R26.89);Muscle weakness (generalized) (M62.81);Pain Pain - part of body: Leg   Activity Tolerance Patient limited by fatigue   Patient Left in bed;with call bell/phone within reach;with bed alarm set;with family/visitor present   Nurse Communication Mobility status        Time: 1016-1040 OT Time Calculation (min): 24 min  Charges: OT General Charges $OT Visit: 1 Visit OT Treatments $Self Care/Home Management : 8-22 mins $Therapeutic Activity: 8-22 mins    Jacques Karna Loose 11/06/2023, 2:02 PM

## 2023-11-06 NOTE — Progress Notes (Signed)
   11/06/23 2022  BiPAP/CPAP/SIPAP  $ Non-Invasive Ventilator  Non-Invasive Vent Subsequent  BiPAP/CPAP/SIPAP Pt Type Adult  BiPAP/CPAP/SIPAP Resmed  Mask Type Full face mask  Mask Size Medium  IPAP 12 cmH20  EPAP 6 cmH2O  Flow Rate 5 lpm  Patient Home Machine No  Patient Home Mask No  Patient Home Tubing No  CPAP/SIPAP surface wiped down Yes  Device Plugged into RED Power Outlet Yes  BiPAP/CPAP /SiPAP Vitals  Pulse Rate 94  Resp 19  SpO2 93 %  Bilateral Breath Sounds Clear;Diminished

## 2023-11-06 NOTE — Plan of Care (Signed)
   Problem: Education: Goal: Knowledge of General Education information will improve Description Including pain rating scale, medication(s)/side effects and non-pharmacologic comfort measures Outcome: Progressing   Problem: Clinical Measurements: Goal: Will remain free from infection Outcome: Progressing   Problem: Activity: Goal: Risk for activity intolerance will decrease Outcome: Progressing   Problem: Nutrition: Goal: Adequate nutrition will be maintained Outcome: Progressing   Problem: Safety: Goal: Ability to remain free from injury will improve Outcome: Progressing

## 2023-11-07 DIAGNOSIS — J189 Pneumonia, unspecified organism: Secondary | ICD-10-CM | POA: Diagnosis not present

## 2023-11-07 NOTE — Progress Notes (Signed)
 Physical Therapy Treatment Patient Details Name: Morgan Odonnell MRN: [id number] DOB: [birth year] Today's Date: 11/07/2023   History of Present Illness 73 y.o female admitted 10/30/23 with AMS, CAP. 8/22 hypokalemia. PMH: admission 7/11-7/18 with D/C to SNF, return home 8/3, readmit 8/7-8/15 with AMS. CHF, OSA, COPD, hypothyroidism, obesity, chronic respiratory failure on 4L, depression,chronic back pain, HTN    PT Comments  Pt pleasant and able to progress to standing trials from recliner with RW present. Pt limited by incontinent stool on arrival and with transfer to chair. Pt educated for sequence and safety with STM deficits noted requiring repetition. Will continue to follow. Patient will benefit from continued inpatient follow up therapy, <3 hours/day  SPO2 89-95% on 5L with activity     If plan is discharge home, recommend the following: Two people to help with walking and/or transfers;Assist for transportation;Supervision due to cognitive status;Two people to help with bathing/dressing/bathroom;Direct supervision/assist for financial management;Direct supervision/assist for medications management;Other (comment)   Can travel by private vehicle     No  Equipment Recommendations  Hoyer lift;Hospital bed    Recommendations for Other Services       Precautions / Restrictions Precautions Precautions: Fall;Other (comment) Recall of Precautions/Restrictions: Impaired Precaution/Restrictions Comments: Fragile skin, edema, O2 dep     Mobility  Bed Mobility Overal bed mobility: Needs Assistance Bed Mobility: Rolling, Sidelying to Sit Rolling: Mod assist Sidelying to sit: Mod assist, +2 for physical assistance, Used rails       General bed mobility comments: mod assist to roll left x 2, and rt x 1 use of rail with cues for sequence. Mod +2 to bring legs off EOB and elevate trunk from surface as well as scoot fully to EOB    Transfers Overall transfer level: Needs  assistance   Transfers: Sit to/from Stand, Bed to chair/wheelchair/BSC Sit to Stand: Mod assist, +2 physical assistance Stand pivot transfers: Mod assist, +2 physical assistance         General transfer comment: Mod +2 assist with hooking UB on therpist and tech to stand and take sequential steps to chair. Pt stood from chair with RW present and mod +1 assist x 4 trials with max standing grossly 40 sec for pericare due to incontinent stool    Ambulation/Gait                   Stairs             Wheelchair Mobility     Tilt Bed    Modified Rankin (Stroke Patients Only)       Balance Overall balance assessment: Needs assistance Sitting-balance support: No upper extremity supported, Feet supported Sitting balance-Leahy Scale: Fair Sitting balance - Comments: EOB with CGA   Standing balance support: Bilateral upper extremity supported, During functional activity Standing balance-Leahy Scale: Poor Standing balance comment: UB support on staff or RW in standing with min-mod assist, cues to extend hips                            Communication Communication Communication: No apparent difficulties  Cognition Arousal: Alert Behavior During Therapy: WFL for tasks assessed/performed   PT - Cognitive impairments: Awareness, Memory, Problem solving, Safety/Judgement, Orientation   Orientation impairments: Time, Situation                   PT - Cognition Comments: pt oriented to self,  place, month and year Following commands: Impaired Following commands  impaired: Follows one step commands with increased time, Follows one step commands inconsistently    Cueing Cueing Techniques: Verbal cues, Tactile cues, Gestural cues  Exercises      General Comments        Pertinent Vitals/Pain Pain Assessment Pain Assessment: Faces Pain Score: 4  Faces Pain Scale: Hurts little more Pain Location: LEs with movement Pain Descriptors / Indicators:  Discomfort, Aching, Sore Pain Intervention(s): Limited activity within patient's tolerance, Monitored during session, Repositioned    Home Living                          Prior Function            PT Goals (current goals can now be found in the care plan section) Progress towards PT goals: Progressing toward goals    Frequency    Min 2X/week      PT Plan      Co-evaluation              AM-PAC PT 6 Clicks Mobility   Outcome Measure  Help needed turning from your back to your side while in a flat bed without using bedrails?: A Lot Help needed moving from lying on your back to sitting on the side of a flat bed without using bedrails?: A Lot Help needed moving to and from a bed to a chair (including a wheelchair)?: Total Help needed standing up from a chair using your arms (e.g., wheelchair or bedside chair)?: Total Help needed to walk in hospital room?: Total Help needed climbing 3-5 steps with a railing? : Total 6 Click Score: 8    End of Session Equipment Utilized During Treatment: Oxygen  Activity Tolerance: Patient tolerated treatment well Patient left: in chair;with call bell/phone within reach;with chair alarm set Nurse Communication: Mobility status PT Visit Diagnosis: Muscle weakness (generalized) (M62.81);Difficulty in walking, not elsewhere classified (R26.2)     Time: 9183-9153 PT Time Calculation (min) (ACUTE ONLY): 30 min  Charges:    $Therapeutic Activity: 23-37 mins PT General Charges $$ ACUTE PT VISIT: 1 Visit                     Lenoard SQUIBB, PT Acute Rehabilitation Services Office: [phone]    Lenoard NOVAK Morgan Odonnell 11/07/2023, 10:35 AM

## 2023-11-07 NOTE — TOC Progression Note (Addendum)
 Transition of Care [facility]) - Progression Note    Patient Details  Name: AIVY AKTER MRN: [id number] Date of Birth: 1950/10/10  Transition of Care [facility]) CM/SW Contact  Isaiah Public, LCSWA Phone Number: 11/07/2023, 10:50 AM  Clinical Narrative:     CSW spoke with patients son [name] who informed CSW that he did start appeal for patient. Lawrence informed CSW that patients insurance informed him that determination from appeal can take up to 72 hours. Patients son [name] gave CSW permission to call patients insurance and verify that appeal has been started.CSW called patients insurance who informed CSW that appeal was started late yesterday and that determination from appeal may be received by Tuesday since Monday is a holiday. CSW informed MD. CSW will continue to follow.  Expected Discharge Plan: Skilled Nursing Facility Barriers to Discharge: Continued Medical Work up               Expected Discharge Plan and Services In-house Referral: Clinical Social Work     Living arrangements for the past 2 months: Single Family Home                                       Social Drivers of Health (SDOH) Interventions SDOH Screenings   Food Insecurity: No Food Insecurity (10/31/2023)  Housing: Low Risk  (10/31/2023)  Transportation Needs: Unmet Transportation Needs (10/31/2023)  Utilities: Not At Risk (10/31/2023)  Depression (PHQ2-9): Medium Risk (01/13/2019)  Social Connections: Moderately Isolated (10/31/2023)  Tobacco Use: Medium Risk (10/31/2023)    Readmission Risk Interventions    09/19/2023    1:52 PM 08/17/2023    9:31 AM  Readmission Risk Prevention Plan  Transportation Screening Complete Complete  PCP or Specialist Appt within 5-7 Days  Complete  PCP or Specialist Appt within 3-5 Days Complete   Home Care Screening  Complete  Medication Review (RN CM)  Complete  HRI or Home Care Consult Complete   Social Work Consult for Recovery Care Planning/Counseling  Complete   Palliative Care Screening Complete   Medication Review Oceanographer) Complete

## 2023-11-07 NOTE — Progress Notes (Signed)
   11/07/23 2240  BiPAP/CPAP/SIPAP  BiPAP/CPAP/SIPAP Pt Type Adult  BiPAP/CPAP/SIPAP Resmed  Reason BIPAP/CPAP not in use Non-compliant  BiPAP/CPAP /SiPAP Vitals  Pulse Rate 90  Resp 15  SpO2 94 %  Bilateral Breath Sounds Diminished

## 2023-11-07 NOTE — Plan of Care (Signed)

## 2023-11-07 NOTE — Progress Notes (Signed)
 PROGRESS NOTE    Morgan Odonnell  FMW:[id number] DOB: Jul 13, 1950 DOA: 10/30/2023 PCP: Pcp, No  Brief Narrative:  This 73 years old female with history of morbid obesity, sleep apnea, obesity hypoventilation, chronic hypoxemic and hypercarbic respiratory failure on 4 L oxygen  at home, BiPAP at night but does not use it, chronic diastolic heart failure, COPD, hypothyroidism, CKD3a, bedbound status brought to the ED with worsening confusion.  Noncompliance to BiPAP at home.  Discharged with similar episode on 8/15 but family unable to care for patient at home given worsening confusion in the setting of noncompliance.   Assessment & Plan:   Principal Problem:   CAP (community acquired pneumonia) Active Problems:   Acute metabolic encephalopathy   Pressure injury of skin   Acute metabolic encephalopathy >  Improving. Acute on chronic hypoxic / hypercarbic respiratory failure: Complicated by severe sleep apnea and obesity hypoventilation: -Secondary to non-compliance. -Continue supportive care - Now compliant / tolerating BiPAP overnight / during the day - Hoping to wean patient off BiPAP dependence, trial to use only while sleeping , will make disposition to SNF difficult if she continues to be on as needed BiPAP - could consider LTAC. - She seems improved , appears back to her baseline mental status. - Patient only required BiPAP at night.  She is now cleared medically,  awaiting SNF placement. SNF declined, son appealed against the decision.    Acute on chronic diastolic congestive heart failure, right-sided failure: - Not in any acute exacerbation. - Continue holding diuretics. Resume at discharge.   Suspected pneumonia: -High risk for aspiration given mental status changes. -Continued  empiric coverage with Zosyn , vancomycin  given recent hospitalization and baseline respiratory status. -She completed total 5 days of antibiotics. -Patient does not meet sepsis criteria.   AKI  on CKD stage IIIa : - carries former diagnosis of CKD 3A. - Renal functions improving and back to baseline.  Hypokalemia: Potassium replaced.  Continue to monitor.   COPD without acute exacerbation: Continue nebulizer/inhaler therapy .  Hypothyroidism: Continue Synthroid .  Bilateral leg lymphedema: Chronic. Continue supportive care.   Goals of care: Discussed with patient and husband at bedside at length  given recurrent hospitalizations,  worsening mental status and noncompliance,  patient remains a remarkably high risk for decompensation. Palliative care is consulted. SNF declined.  Son has appealed against the decision.   Morbid obesity : Body mass index is 46.13 kg/m. Diet and Exercise discussed in detail.  DVT prophylaxis: Lovenox  Code Status: DNR Family Communication: Husband At bedside. Disposition Plan:    Status is: Inpatient Remains inpatient appropriate because:   Patient now medically clear, awaiting insurance authorization for SNF placement.  Consultants:  Palliative care  Procedures: None  Antimicrobials:  Anti-infectives (From admission, onward)    Start     Dose/Rate Route Frequency Ordered Stop   11/04/23 1400  levofloxacin  (LEVAQUIN ) tablet 750 mg        750 mg Oral  Once 11/04/23 1302 11/04/23 1451   11/01/23 2200  vancomycin  (VANCOREADY) IVPB 1750 mg/350 mL  Status:  Discontinued        1,750 mg 175 mL/hr over 120 Minutes Intravenous Every 48 hours 10/31/23 0333 11/04/23 1302   10/31/23 0600  piperacillin -tazobactam (ZOSYN ) IVPB 3.375 g  Status:  Discontinued        3.375 g 12.5 mL/hr over 240 Minutes Intravenous Every 8 hours 10/31/23 0118 11/04/23 1302   10/30/23 2215  piperacillin -tazobactam (ZOSYN ) IVPB 3.375 g        3.375  g 100 mL/hr over 30 Minutes Intravenous  Once 10/30/23 2200 10/30/23 2241   10/30/23 2215  vancomycin  (VANCOREADY) IVPB 1750 mg/350 mL        1,750 mg 175 mL/hr over 120 Minutes Intravenous  Once 10/30/23 2200 10/31/23  0153      Subjective: Patient was seen and examined at bedside. Overnight events noted. Patient was sitting comfortably in the recliner .  Her BiPAP DME has been approved. Husband at bedside, states he is not able to take care of her given due to noncompliance. She denies any other concerns.  Objective: Vitals:   11/06/23 2022 11/07/23 0030 11/07/23 0407 11/07/23 1031  BP:  139/67 (!) 141/91   Pulse: 94  93 85  Resp: 19  16 20   Temp:  99.1 F (37.3 C) 98.8 F (37.1 C) 98 F (36.7 C)  TempSrc:  Oral Oral Axillary  SpO2: 93%  100% 93%  Weight:      Height:        Intake/Output Summary (Last 24 hours) at 11/07/2023 1100 Last data filed at 11/06/2023 2329 Gross per 24 hour  Intake --  Output 400 ml  Net -400 ml   Filed Weights   10/31/23 0107  Weight: 114.4 kg    Examination:  General exam: Appears calm and comfortable, not in any acute distress.  Morbidly obese. Respiratory system: CTA Bilaterally . Respiratory effort normal.  RR 13 Cardiovascular system: S1 & S2 heard, RRR. No JVD, murmurs, rubs, gallops or clicks.  Gastrointestinal system: Abdomen is non distended, soft and nontender. Normal bowel sounds heard. Central nervous system: Alert and oriented x 3. No focal neurological deficits. Extremities: Edema+, no cyanosis, no clubbing Skin: No rashes, lesions or ulcers Psychiatry: Judgement and insight appear normal. Mood & affect appropriate.    Data Reviewed: I have personally reviewed following labs and imaging studies  CBC: Recent Labs  Lab 11/01/23 0508 11/02/23 0535 11/04/23 0648 11/06/23 0518  WBC 8.7 9.1 10.3 9.5  NEUTROABS 5.0  --   --   --   HGB 10.3* 10.4* 10.1* 9.6*  HCT 31.8* 33.0* 32.7* 30.4*  MCV 85.9 86.8 88.1 86.9  PLT 257 265 201 168   Basic Metabolic Panel: Recent Labs  Lab 10/31/23 2225 11/01/23 0508 11/02/23 0535 11/04/23 0648 11/06/23 0518  NA 135 135 137 137 137  K 4.1 3.5 3.1* 3.6 3.5  CL 100 104 102 105 105  CO2 26 27  27 23 24   GLUCOSE 82 78 88 83 93  BUN 23 20 14 12 12   CREATININE 1.37* 1.36* 1.59* 1.33* 1.22*  CALCIUM  8.8* 8.5* 8.8* 9.0 9.4  MG  --  1.7  --  1.7  --   PHOS  --   --   --  3.4  --    GFR: Estimated Creatinine Clearance: 50.6 mL/min (A) (by C-G formula based on SCr of 1.22 mg/dL (H)). Liver Function Tests: No results for input(s): AST, ALT, ALKPHOS, BILITOT, PROT, ALBUMIN in the last 168 hours.  No results for input(s): LIPASE, AMYLASE in the last 168 hours. No results for input(s): AMMONIA in the last 168 hours. Coagulation Profile: No results for input(s): INR, PROTIME in the last 168 hours. Cardiac Enzymes: No results for input(s): CKTOTAL, CKMB, CKMBINDEX, TROPONINI in the last 168 hours.  BNP (last 3 results) No results for input(s): PROBNP in the last 8760 hours. HbA1C: No results for input(s): HGBA1C in the last 72 hours. CBG: No results for input(s): GLUCAP in  the last 168 hours. Lipid Profile: No results for input(s): CHOL, HDL, LDLCALC, TRIG, CHOLHDL, LDLDIRECT in the last 72 hours. Thyroid  Function Tests: No results for input(s): TSH, T4TOTAL, FREET4, T3FREE, THYROIDAB in the last 72 hours. Anemia Panel: No results for input(s): VITAMINB12, FOLATE, FERRITIN, TIBC, IRON, RETICCTPCT in the last 72 hours. Sepsis Labs: No results for input(s): PROCALCITON, LATICACIDVEN in the last 168 hours.   Recent Results (from the past 240 hours)  Blood culture (routine x 2)     Status: None   Collection Time: 10/30/23  8:59 PM   Specimen: BLOOD  Result Value Ref Range Status   Specimen Description BLOOD RIGHT ANTECUBITAL  Final   Special Requests   Final    BOTTLES DRAWN AEROBIC AND ANAEROBIC Blood Culture results may not be optimal due to an inadequate volume of blood received in culture bottles   Culture   Final    NO GROWTH 5 DAYS Performed at [facility], [address] [place],  [place] [address]    Report Status 11/04/2023 FINAL  Final  Blood culture (routine x 2)     Status: None   Collection Time: 10/30/23  8:59 PM   Specimen: BLOOD  Result Value Ref Range Status   Specimen Description BLOOD LEFT ANTECUBITAL  Final   Special Requests   Final    BOTTLES DRAWN AEROBIC AND ANAEROBIC Blood Culture results may not be optimal due to an inadequate volume of blood received in culture bottles   Culture   Final    NO GROWTH 5 DAYS Performed at [facility], [address], [place], [place] [address]    Report Status 11/04/2023 FINAL  Final  Urine Culture     Status: Abnormal   Collection Time: 10/30/23 11:40 PM   Specimen: Urine, Catheterized  Result Value Ref Range Status   Specimen Description URINE, CATHETERIZED  Final   Special Requests   Final    NONE Performed at [facility], [address] [place], [place] [address]    Culture >=100,000 COLONIES/mL STAPHYLOCOCCUS EPIDERMIDIS (A)  Final   Report Status 11/01/2023 FINAL  Final   Organism ID, Bacteria STAPHYLOCOCCUS EPIDERMIDIS (A)  Final      Susceptibility   Staphylococcus epidermidis - MIC*    CIPROFLOXACIN >=8 RESISTANT Resistant     GENTAMICIN <=0.5 SENSITIVE Sensitive     NITROFURANTOIN <=16 SENSITIVE Sensitive     OXACILLIN >=4 RESISTANT Resistant     TETRACYCLINE 2 SENSITIVE Sensitive     VANCOMYCIN  <=0.5 SENSITIVE Sensitive     TRIMETH/SULFA 160 RESISTANT Resistant     RIFAMPIN <=0.5 SENSITIVE Sensitive     Inducible Clindamycin NEGATIVE Sensitive     * >=100,000 COLONIES/mL STAPHYLOCOCCUS EPIDERMIDIS  Gastrointestinal Panel by PCR , Stool     Status: None   Collection Time: 11/01/23  7:39 AM   Specimen: Stool  Result Value Ref Range Status   Campylobacter species NOT DETECTED NOT DETECTED Final   Plesimonas shigelloides NOT DETECTED NOT DETECTED Final   Salmonella species NOT DETECTED NOT DETECTED Final   Yersinia enterocolitica NOT DETECTED NOT DETECTED Final   Vibrio species NOT  DETECTED NOT DETECTED Final   Vibrio cholerae NOT DETECTED NOT DETECTED Final   Enteroaggregative E coli (EAEC) NOT DETECTED NOT DETECTED Final   Enteropathogenic E coli (EPEC) NOT DETECTED NOT DETECTED Final   Enterotoxigenic E coli (ETEC) NOT DETECTED NOT DETECTED Final   Shiga like toxin producing E coli (STEC)  NOT DETECTED NOT DETECTED Final   Shigella/Enteroinvasive E coli (EIEC) NOT DETECTED NOT DETECTED Final   Cryptosporidium NOT DETECTED NOT DETECTED Final   Cyclospora cayetanensis NOT DETECTED NOT DETECTED Final   Entamoeba histolytica NOT DETECTED NOT DETECTED Final   Giardia lamblia NOT DETECTED NOT DETECTED Final   Adenovirus F40/41 NOT DETECTED NOT DETECTED Final   Astrovirus NOT DETECTED NOT DETECTED Final   Norovirus GI/GII NOT DETECTED NOT DETECTED Final   Rotavirus A NOT DETECTED NOT DETECTED Final   Sapovirus (I, II, IV, and V) NOT DETECTED NOT DETECTED Final    Comment: Performed at [facility], [address] [place], [place] [address]    Radiology Studies: No results found.  Scheduled Meds:  aspirin  EC  81 mg Oral Daily   baclofen   10 mg Oral TID   cholecalciferol   5,000 Units Oral Daily   citalopram   20 mg Oral Daily   diclofenac  Sodium  2 g Topical QID   feeding supplement  237 mL Oral BID BM   fluticasone  furoate-vilanterol  1 puff Inhalation Daily   Gerhardt's butt cream   Topical BID   heparin  injection (subcutaneous)  5,000 Units Subcutaneous Q8H   levothyroxine   200 mcg Oral QAC breakfast   spironolactone   25 mg Oral Daily   umeclidinium bromide   1 puff Inhalation Daily   Continuous Infusions:   LOS: 7 days    Time spent: 35 Mins  Darcel Dawley, MD [facility]   If 7PM-7AM, please contact night-coverage

## 2023-11-08 DIAGNOSIS — J189 Pneumonia, unspecified organism: Secondary | ICD-10-CM | POA: Diagnosis not present

## 2023-11-08 LAB — CBC
HCT: 31.1 % — ABNORMAL LOW (ref 36.0–46.0)
Hemoglobin: 9.6 g/dL — ABNORMAL LOW (ref 12.0–15.0)
MCH: 27.4 pg (ref 26.0–34.0)
MCHC: 30.9 g/dL (ref 30.0–36.0)
MCV: 88.9 fL (ref 80.0–100.0)
Platelets: 181 K/uL (ref 150–400)
RBC: 3.5 MIL/uL — ABNORMAL LOW (ref 3.87–5.11)
RDW: 16.7 % — ABNORMAL HIGH (ref 11.5–15.5)
WBC: 10.6 K/uL — ABNORMAL HIGH (ref 4.0–10.5)
nRBC: 0 % (ref 0.0–0.2)

## 2023-11-08 LAB — BASIC METABOLIC PANEL WITH GFR
Anion gap: 7 (ref 5–15)
BUN: 15 mg/dL (ref 8–23)
CO2: 27 mmol/L (ref 22–32)
Calcium: 9.8 mg/dL (ref 8.9–10.3)
Chloride: 104 mmol/L (ref 98–111)
Creatinine, Ser: 1.3 mg/dL — ABNORMAL HIGH (ref 0.44–1.00)
GFR, Estimated: 44 mL/min — ABNORMAL LOW (ref >60)
Glucose, Bld: 89 mg/dL (ref 70–99)
Potassium: 3.5 mmol/L (ref 3.5–5.1)
Sodium: 138 mmol/L (ref 135–145)

## 2023-11-08 NOTE — Progress Notes (Signed)
   11/08/23 2124  BiPAP/CPAP/SIPAP  $ Non-Invasive Home Ventilator  Subsequent  BiPAP/CPAP/SIPAP Pt Type Adult  BiPAP/CPAP/SIPAP Resmed  Mask Type Full face mask  Mask Size Medium  IPAP 12 cmH20  EPAP 6 cmH2O  Flow Rate 5 lpm  Patient Home Machine No  Patient Home Mask No  Patient Home Tubing No  CPAP/SIPAP surface wiped down Yes  Device Plugged into RED Power Outlet Yes  BiPAP/CPAP /SiPAP Vitals  Pulse Rate 86  Resp 15  SpO2 92 %  Bilateral Breath Sounds Diminished

## 2023-11-08 NOTE — Progress Notes (Signed)
 Per patient's husband, patient did not wear bipap over night.   Upon initial assessment this morning, patient is lethargic. Patient is alert and oriented to self, disoriented to time and place. Patient is arousable but has difficultly staying wake. When awake, patient is able to follow commands.   MD aware and at bedside.   Bipap placed on patient at 0940.  Bipap settings: 12/6, 5L.

## 2023-11-08 NOTE — Progress Notes (Signed)
 At 1230pm, bipap removed and patient placed on 5L [place]. Patient is alert, following commands, and able to remain awake. She appears to be at her baseline.   Patient maintaining oxygen  saturation of 88-92% on 5%L [place].   Patient states she is hungry. Lunch has been provided. Husband at bedside willing to assist in meal time.

## 2023-11-08 NOTE — Progress Notes (Signed)
 PROGRESS NOTE    Morgan Odonnell  FMW:[id number] DOB: 01/22/1951 DOA: 10/30/2023 PCP: Pcp, No  Brief Narrative:  This 73 years old female with history of morbid obesity, sleep apnea, obesity hypoventilation, chronic hypoxemic and hypercarbic respiratory failure on 4 L oxygen  at home, BiPAP at night but does not use it, chronic diastolic heart failure, COPD, hypothyroidism, CKD3a, bedbound status brought to the ED with worsening confusion.  Noncompliance to BiPAP at home.  Discharged with similar episode on 8/15 but family unable to care for patient at home given worsening confusion in the setting of noncompliance.   Assessment & Plan:   Principal Problem:   CAP (community acquired pneumonia) Active Problems:   Acute metabolic encephalopathy   Pressure injury of skin   Acute metabolic encephalopathy >  Improving. Acute on chronic hypoxic / hypercarbic respiratory failure: Complicated by severe sleep apnea and obesity hypoventilation: -Secondary to non-compliance. -Continue supportive care - Now compliant / tolerating BiPAP overnight / during the day - Hoping to wean patient off BiPAP dependence, trial to use only while sleeping , will make disposition to SNF difficult if she continues to be on as needed BiPAP - could consider LTAC. - She seems improved , appears back to her baseline mental status. - Patient only required BiPAP at night.  She is now cleared medically,  awaiting SNF placement. SNF declined, son appealed against the decision. - Last night patient has not received BiPAP so seems slightly lethargic.  BiPAP resumed.    Acute on chronic diastolic congestive heart failure, right-sided failure: - Not in any acute exacerbation. - Continue holding diuretics. Resume at discharge.   Suspected pneumonia: -High risk for aspiration given mental status changes. -Continued  empiric coverage with Zosyn , vancomycin  given recent hospitalization and baseline respiratory status. -She  completed total 5 days of antibiotics. -Patient does not meet sepsis criteria.   AKI on CKD stage IIIa : - carries former diagnosis of CKD 3A. - Renal functions improving and back to baseline.  Hypokalemia: Potassium replaced.  Continue to monitor.   COPD without acute exacerbation:  Continue nebulizer/inhaler therapy .  Hypothyroidism: Continue Synthroid .  Bilateral leg lymphedema: Chronic. Continue supportive care.   Goals of care: Discussed with patient and husband at bedside at length  given recurrent hospitalizations,  worsening mental status and noncompliance,  patient remains a remarkably high risk for decompensation. Palliative care is consulted. SNF declined.  Son has appealed against the decision.   Morbid obesity : Body mass index is 46.13 kg/m. Diet and Exercise discussed in detail.  DVT prophylaxis: Lovenox  Code Status: DNR Family Communication: Husband At bedside. Disposition Plan:    Status is: Inpatient Remains inpatient appropriate because:   Patient now medically clear, awaiting insurance authorization for SNF placement.  Consultants:  Palliative care  Procedures: None  Antimicrobials:  Anti-infectives (From admission, onward)    Start     Dose/Rate Route Frequency Ordered Stop   11/04/23 1400  levofloxacin  (LEVAQUIN ) tablet 750 mg        750 mg Oral  Once 11/04/23 1302 11/04/23 1451   11/01/23 2200  vancomycin  (VANCOREADY) IVPB 1750 mg/350 mL  Status:  Discontinued        1,750 mg 175 mL/hr over 120 Minutes Intravenous Every 48 hours 10/31/23 0333 11/04/23 1302   10/31/23 0600  piperacillin -tazobactam (ZOSYN ) IVPB 3.375 g  Status:  Discontinued        3.375 g 12.5 mL/hr over 240 Minutes Intravenous Every 8 hours 10/31/23 0118 11/04/23 1302  10/30/23 2215  piperacillin -tazobactam (ZOSYN ) IVPB 3.375 g        3.375 g 100 mL/hr over 30 Minutes Intravenous  Once 10/30/23 2200 10/30/23 2241   10/30/23 2215  vancomycin  (VANCOREADY) IVPB 1750 mg/350  mL        1,750 mg 175 mL/hr over 120 Minutes Intravenous  Once 10/30/23 2200 10/31/23 0153      Subjective: Patient was seen and examined at bedside. Overnight events noted. Patient was lying in the bed,  slightly lethargic.   Husband reports she was not placed on BIPAP last night.  Objective: Vitals:   11/08/23 0521 11/08/23 0753 11/08/23 0801 11/08/23 0940  BP: (!) 135/55  (!) 140/51   Pulse: 84 84 81 82  Resp: 18 18 15 15   Temp: 97.7 F (36.5 C)  97.7 F (36.5 C)   TempSrc: Oral  Oral   SpO2: 96% 92% 90% 96%  Weight:      Height:        Intake/Output Summary (Last 24 hours) at 11/08/2023 1156 Last data filed at 11/08/2023 1059 Gross per 24 hour  Intake 1320 ml  Output 1550 ml  Net -230 ml   Filed Weights   10/31/23 0107  Weight: 114.4 kg    Examination:  General exam: Appears calm and comfortable, not in any acute distress.  Morbidly obese. Respiratory system: CTA Bilaterally . Respiratory effort normal.  RR 13 Cardiovascular system: S1 & S2 heard, RRR. No JVD, murmurs, rubs, gallops or clicks.  Gastrointestinal system: Abdomen is non distended, soft and nontender. Normal bowel sounds heard. Central nervous system: Lethargic, but arousable. No focal neurological deficits. Extremities: Edema+, no cyanosis, no clubbing Skin: No rashes, lesions or ulcers Psychiatry: Mood & affect appropriate.    Data Reviewed: I have personally reviewed following labs and imaging studies  CBC: Recent Labs  Lab 11/02/23 0535 11/04/23 0648 11/06/23 0518 11/08/23 0453  WBC 9.1 10.3 9.5 10.6*  HGB 10.4* 10.1* 9.6* 9.6*  HCT 33.0* 32.7* 30.4* 31.1*  MCV 86.8 88.1 86.9 88.9  PLT 265 201 168 181   Basic Metabolic Panel: Recent Labs  Lab 11/02/23 0535 11/04/23 0648 11/06/23 0518 11/08/23 0453  NA 137 137 137 138  K 3.1* 3.6 3.5 3.5  CL 102 105 105 104  CO2 27 23 24 27   GLUCOSE 88 83 93 89  BUN 14 12 12 15   CREATININE 1.59* 1.33* 1.22* 1.30*  CALCIUM  8.8* 9.0 9.4  9.8  MG  --  1.7  --   --   PHOS  --  3.4  --   --    GFR: Estimated Creatinine Clearance: 47.5 mL/min (A) (by C-G formula based on SCr of 1.3 mg/dL (H)). Liver Function Tests: No results for input(s): AST, ALT, ALKPHOS, BILITOT, PROT, ALBUMIN in the last 168 hours.  No results for input(s): LIPASE, AMYLASE in the last 168 hours. No results for input(s): AMMONIA in the last 168 hours. Coagulation Profile: No results for input(s): INR, PROTIME in the last 168 hours. Cardiac Enzymes: No results for input(s): CKTOTAL, CKMB, CKMBINDEX, TROPONINI in the last 168 hours.  BNP (last 3 results) No results for input(s): PROBNP in the last 8760 hours. HbA1C: No results for input(s): HGBA1C in the last 72 hours. CBG: No results for input(s): GLUCAP in the last 168 hours. Lipid Profile: No results for input(s): CHOL, HDL, LDLCALC, TRIG, CHOLHDL, LDLDIRECT in the last 72 hours. Thyroid  Function Tests: No results for input(s): TSH, T4TOTAL, FREET4, T3FREE, THYROIDAB in  the last 72 hours. Anemia Panel: No results for input(s): VITAMINB12, FOLATE, FERRITIN, TIBC, IRON, RETICCTPCT in the last 72 hours. Sepsis Labs: No results for input(s): PROCALCITON, LATICACIDVEN in the last 168 hours.   Recent Results (from the past 240 hours)  Blood culture (routine x 2)     Status: None   Collection Time: 10/30/23  8:59 PM   Specimen: BLOOD  Result Value Ref Range Status   Specimen Description BLOOD RIGHT ANTECUBITAL  Final   Special Requests   Final    BOTTLES DRAWN AEROBIC AND ANAEROBIC Blood Culture results may not be optimal due to an inadequate volume of blood received in culture bottles   Culture   Final    NO GROWTH 5 DAYS Performed at [facility], [address], [place], [place] [address]    Report Status 11/04/2023 FINAL  Final  Blood culture (routine x 2)     Status: None   Collection Time: 10/30/23  8:59 PM    Specimen: BLOOD  Result Value Ref Range Status   Specimen Description BLOOD LEFT ANTECUBITAL  Final   Special Requests   Final    BOTTLES DRAWN AEROBIC AND ANAEROBIC Blood Culture results may not be optimal due to an inadequate volume of blood received in culture bottles   Culture   Final    NO GROWTH 5 DAYS Performed at [facility], [address] [place], [place] [address]    Report Status 11/04/2023 FINAL  Final  Urine Culture     Status: Abnormal   Collection Time: 10/30/23 11:40 PM   Specimen: Urine, Catheterized  Result Value Ref Range Status   Specimen Description URINE, CATHETERIZED  Final   Special Requests   Final    NONE Performed at [facility], [address], [place], [place] [address]    Culture >=100,000 COLONIES/mL STAPHYLOCOCCUS EPIDERMIDIS (A)  Final   Report Status 11/01/2023 FINAL  Final   Organism ID, Bacteria STAPHYLOCOCCUS EPIDERMIDIS (A)  Final      Susceptibility   Staphylococcus epidermidis - MIC*    CIPROFLOXACIN >=8 RESISTANT Resistant     GENTAMICIN <=0.5 SENSITIVE Sensitive     NITROFURANTOIN <=16 SENSITIVE Sensitive     OXACILLIN >=4 RESISTANT Resistant     TETRACYCLINE 2 SENSITIVE Sensitive     VANCOMYCIN  <=0.5 SENSITIVE Sensitive     TRIMETH/SULFA 160 RESISTANT Resistant     RIFAMPIN <=0.5 SENSITIVE Sensitive     Inducible Clindamycin NEGATIVE Sensitive     * >=100,000 COLONIES/mL STAPHYLOCOCCUS EPIDERMIDIS  Gastrointestinal Panel by PCR , Stool     Status: None   Collection Time: 11/01/23  7:39 AM   Specimen: Stool  Result Value Ref Range Status   Campylobacter species NOT DETECTED NOT DETECTED Final   Plesimonas shigelloides NOT DETECTED NOT DETECTED Final   Salmonella species NOT DETECTED NOT DETECTED Final   Yersinia enterocolitica NOT DETECTED NOT DETECTED Final   Vibrio species NOT DETECTED NOT DETECTED Final   Vibrio cholerae NOT DETECTED NOT DETECTED Final   Enteroaggregative E coli (EAEC) NOT DETECTED NOT  DETECTED Final   Enteropathogenic E coli (EPEC) NOT DETECTED NOT DETECTED Final   Enterotoxigenic E coli (ETEC) NOT DETECTED NOT DETECTED Final   Shiga like toxin producing E coli (STEC) NOT DETECTED NOT DETECTED Final   Shigella/Enteroinvasive E coli (EIEC) NOT DETECTED NOT DETECTED Final   Cryptosporidium NOT DETECTED NOT DETECTED Final   Cyclospora cayetanensis NOT DETECTED NOT DETECTED Final  Entamoeba histolytica NOT DETECTED NOT DETECTED Final   Giardia lamblia NOT DETECTED NOT DETECTED Final   Adenovirus F40/41 NOT DETECTED NOT DETECTED Final   Astrovirus NOT DETECTED NOT DETECTED Final   Norovirus GI/GII NOT DETECTED NOT DETECTED Final   Rotavirus A NOT DETECTED NOT DETECTED Final   Sapovirus (I, II, IV, and V) NOT DETECTED NOT DETECTED Final    Comment: Performed at [facility], [address], [place], [place] [address]    Radiology Studies: No results found.  Scheduled Meds:  aspirin  EC  81 mg Oral Daily   baclofen   10 mg Oral TID   cholecalciferol   5,000 Units Oral Daily   citalopram   20 mg Oral Daily   diclofenac  Sodium  2 g Topical QID   feeding supplement  237 mL Oral BID BM   fluticasone  furoate-vilanterol  1 puff Inhalation Daily   Gerhardt's butt cream   Topical BID   heparin  injection (subcutaneous)  5,000 Units Subcutaneous Q8H   levothyroxine   200 mcg Oral QAC breakfast   spironolactone   25 mg Oral Daily   umeclidinium bromide   1 puff Inhalation Daily   Continuous Infusions:   LOS: 8 days    Time spent: 35 Mins  Darcel Dawley, MD [facility]   If 7PM-7AM, please contact night-coverage

## 2023-11-08 NOTE — Plan of Care (Signed)

## 2023-11-09 DIAGNOSIS — Z515 Encounter for palliative care: Secondary | ICD-10-CM | POA: Diagnosis not present

## 2023-11-09 DIAGNOSIS — J189 Pneumonia, unspecified organism: Secondary | ICD-10-CM | POA: Diagnosis not present

## 2023-11-09 NOTE — Progress Notes (Signed)
Patient removed cpap mask

## 2023-11-09 NOTE — Progress Notes (Signed)
 PROGRESS NOTE    Morgan Odonnell  FMW:[id number] DOB: Sep 06, 1950 DOA: 10/30/2023 PCP: Pcp, No  Brief Narrative:  This 73 years old female with history of morbid obesity, sleep apnea, obesity hypoventilation, chronic hypoxemic and hypercarbic respiratory failure on 4 L oxygen  at home, BiPAP at night but does not use it, chronic diastolic heart failure, COPD, hypothyroidism, CKD3a, bedbound status brought to the ED with worsening confusion.  Noncompliance to BiPAP at home.  Discharged with similar episode on 8/15 but family unable to care for patient at home given worsening confusion in the setting of noncompliance.   Assessment & Plan:   Principal Problem:   CAP (community acquired pneumonia) Active Problems:   Acute metabolic encephalopathy   Pressure injury of skin   Acute metabolic encephalopathy >  Improving. Acute on chronic hypoxic / hypercarbic respiratory failure: Complicated by severe sleep apnea and obesity hypoventilation: -Secondary to non-compliance. -Continue supportive care - Now compliant / tolerating BiPAP overnight / during the day - Hoping to wean patient off BiPAP dependence, trial to use only while sleeping , will make disposition to SNF difficult if she continues to be on as needed BiPAP - could consider LTAC. - She seems improved , appears back to her baseline mental status. - Patient only requires BiPAP at night.  She is now cleared medically,  awaiting SNF placement. SNF declined, son appealed against the decision. - Last night patient has not received BiPAP so seems slightly lethargic.  BiPAP resumed. - Patient seems back to her baseline mental status.  Now transitioned to 3 L of supplemental oxygen .    Acute on chronic diastolic congestive heart failure, right-sided failure: - Not in any acute exacerbation. - Continue holding diuretics. Resume at discharge.   Suspected pneumonia: -High risk for aspiration given mental status changes. -Continued   empiric coverage with Zosyn , vancomycin  given recent hospitalization and baseline respiratory status. -She completed total 5 days of antibiotics. -Patient does not meet sepsis criteria.   AKI on CKD stage IIIa : - carries former diagnosis of CKD 3A. - Renal functions improving and back to baseline.  Hypokalemia: Potassium replaced.  Continue to monitor.   COPD without acute exacerbation:  Continue nebulizer/inhaler therapy .  Hypothyroidism: Continue Synthroid .  Bilateral leg lymphedema: Chronic. Continue supportive care.   Goals of care: Discussed with patient and husband at bedside at length  given recurrent hospitalizations,  worsening mental status and noncompliance,  patient remains a remarkably high risk for decompensation. Palliative care is consulted. SNF declined.  Son has appealed against the decision.   Morbid obesity : Body mass index is 46.13 kg/m. Diet and Exercise discussed in detail.  DVT prophylaxis: Lovenox  Code Status: DNR Family Communication: Husband At bedside. Disposition Plan:    Status is: Inpatient Remains inpatient appropriate because:   Patient now medically clear, awaiting insurance authorization for SNF placement.  Consultants:  Palliative care  Procedures: None  Antimicrobials:  Anti-infectives (From admission, onward)    Start     Dose/Rate Route Frequency Ordered Stop   11/04/23 1400  levofloxacin  (LEVAQUIN ) tablet 750 mg        750 mg Oral  Once 11/04/23 1302 11/04/23 1451   11/01/23 2200  vancomycin  (VANCOREADY) IVPB 1750 mg/350 mL  Status:  Discontinued        1,750 mg 175 mL/hr over 120 Minutes Intravenous Every 48 hours 10/31/23 0333 11/04/23 1302   10/31/23 0600  piperacillin -tazobactam (ZOSYN ) IVPB 3.375 g  Status:  Discontinued  3.375 g 12.5 mL/hr over 240 Minutes Intravenous Every 8 hours 10/31/23 0118 11/04/23 1302   10/30/23 2215  piperacillin -tazobactam (ZOSYN ) IVPB 3.375 g        3.375 g 100 mL/hr over 30  Minutes Intravenous  Once 10/30/23 2200 10/30/23 2241   10/30/23 2215  vancomycin  (VANCOREADY) IVPB 1750 mg/350 mL        1,750 mg 175 mL/hr over 120 Minutes Intravenous  Once 10/30/23 2200 10/31/23 0153      Subjective: Patient was seen and examined at bedside. Overnight events noted. Patient is lying comfortably in the bed,  back to her baseline.  Remains on 3 L. She is awaiting appeal decision regarding SNF decline.  Objective: Vitals:   11/08/23 2124 11/09/23 0023 11/09/23 0500 11/09/23 0842  BP:  (!) 111/48 138/60 (!) 126/59  Pulse: 86 87 82 83  Resp: 15 17 14 16   Temp:  97.9 F (36.6 C) 98 F (36.7 C) 98.6 F (37 C)  TempSrc:  Oral Oral Oral  SpO2: 92% 90% 95% 95%  Weight:      Height:        Intake/Output Summary (Last 24 hours) at 11/09/2023 1137 Last data filed at 11/09/2023 0813 Gross per 24 hour  Intake 477 ml  Output 650 ml  Net -173 ml   Filed Weights   10/31/23 0107  Weight: 114.4 kg    Examination:  General exam: Appears calm and comfortable, not in any acute distress.  Morbidly obese. Respiratory system: CTA Bilaterally . Respiratory effort normal.  RR 14 Cardiovascular system: S1 & S2 heard, RRR. No JVD, murmurs, rubs, gallops or clicks.  Gastrointestinal system: Abdomen is non distended, soft and nontender. Normal bowel sounds heard. Central nervous system: Alert and oriented x 2,  no focal neurological deficits. Extremities: Edema+, no cyanosis, no clubbing Skin: No rashes, lesions or ulcers Psychiatry: Mood & affect appropriate.    Data Reviewed: I have personally reviewed following labs and imaging studies  CBC: Recent Labs  Lab 11/04/23 0648 11/06/23 0518 11/08/23 0453  WBC 10.3 9.5 10.6*  HGB 10.1* 9.6* 9.6*  HCT 32.7* 30.4* 31.1*  MCV 88.1 86.9 88.9  PLT 201 168 181   Basic Metabolic Panel: Recent Labs  Lab 11/04/23 0648 11/06/23 0518 11/08/23 0453  NA 137 137 138  K 3.6 3.5 3.5  CL 105 105 104  CO2 23 24 27   GLUCOSE 83  93 89  BUN 12 12 15   CREATININE 1.33* 1.22* 1.30*  CALCIUM  9.0 9.4 9.8  MG 1.7  --   --   PHOS 3.4  --   --    GFR: Estimated Creatinine Clearance: 47.5 mL/min (A) (by C-G formula based on SCr of 1.3 mg/dL (H)). Liver Function Tests: No results for input(s): AST, ALT, ALKPHOS, BILITOT, PROT, ALBUMIN in the last 168 hours.  No results for input(s): LIPASE, AMYLASE in the last 168 hours. No results for input(s): AMMONIA in the last 168 hours. Coagulation Profile: No results for input(s): INR, PROTIME in the last 168 hours. Cardiac Enzymes: No results for input(s): CKTOTAL, CKMB, CKMBINDEX, TROPONINI in the last 168 hours.  BNP (last 3 results) No results for input(s): PROBNP in the last 8760 hours. HbA1C: No results for input(s): HGBA1C in the last 72 hours. CBG: No results for input(s): GLUCAP in the last 168 hours. Lipid Profile: No results for input(s): CHOL, HDL, LDLCALC, TRIG, CHOLHDL, LDLDIRECT in the last 72 hours. Thyroid  Function Tests: No results for input(s): TSH, T4TOTAL,  FREET4, T3FREE, THYROIDAB in the last 72 hours. Anemia Panel: No results for input(s): VITAMINB12, FOLATE, FERRITIN, TIBC, IRON, RETICCTPCT in the last 72 hours. Sepsis Labs: No results for input(s): PROCALCITON, LATICACIDVEN in the last 168 hours.   Recent Results (from the past 240 hours)  Blood culture (routine x 2)     Status: None   Collection Time: 10/30/23  8:59 PM   Specimen: BLOOD  Result Value Ref Range Status   Specimen Description BLOOD RIGHT ANTECUBITAL  Final   Special Requests   Final    BOTTLES DRAWN AEROBIC AND ANAEROBIC Blood Culture results may not be optimal due to an inadequate volume of blood received in culture bottles   Culture   Final    NO GROWTH 5 DAYS Performed at [facility], [address], [place], [place] [address]    Report Status 11/04/2023 FINAL  Final  Blood culture (routine x  2)     Status: None   Collection Time: 10/30/23  8:59 PM   Specimen: BLOOD  Result Value Ref Range Status   Specimen Description BLOOD LEFT ANTECUBITAL  Final   Special Requests   Final    BOTTLES DRAWN AEROBIC AND ANAEROBIC Blood Culture results may not be optimal due to an inadequate volume of blood received in culture bottles   Culture   Final    NO GROWTH 5 DAYS Performed at [facility], [address], [place], [place] [address]    Report Status 11/04/2023 FINAL  Final  Urine Culture     Status: Abnormal   Collection Time: 10/30/23 11:40 PM   Specimen: Urine, Catheterized  Result Value Ref Range Status   Specimen Description URINE, CATHETERIZED  Final   Special Requests   Final    NONE Performed at [facility], [address], [place], [place] [address]    Culture >=100,000 COLONIES/mL STAPHYLOCOCCUS EPIDERMIDIS (A)  Final   Report Status 11/01/2023 FINAL  Final   Organism ID, Bacteria STAPHYLOCOCCUS EPIDERMIDIS (A)  Final      Susceptibility   Staphylococcus epidermidis - MIC*    CIPROFLOXACIN >=8 RESISTANT Resistant     GENTAMICIN <=0.5 SENSITIVE Sensitive     NITROFURANTOIN <=16 SENSITIVE Sensitive     OXACILLIN >=4 RESISTANT Resistant     TETRACYCLINE 2 SENSITIVE Sensitive     VANCOMYCIN  <=0.5 SENSITIVE Sensitive     TRIMETH/SULFA 160 RESISTANT Resistant     RIFAMPIN <=0.5 SENSITIVE Sensitive     Inducible Clindamycin NEGATIVE Sensitive     * >=100,000 COLONIES/mL STAPHYLOCOCCUS EPIDERMIDIS  Gastrointestinal Panel by PCR , Stool     Status: None   Collection Time: 11/01/23  7:39 AM   Specimen: Stool  Result Value Ref Range Status   Campylobacter species NOT DETECTED NOT DETECTED Final   Plesimonas shigelloides NOT DETECTED NOT DETECTED Final   Salmonella species NOT DETECTED NOT DETECTED Final   Yersinia enterocolitica NOT DETECTED NOT DETECTED Final   Vibrio species NOT DETECTED NOT DETECTED Final   Vibrio cholerae NOT DETECTED NOT DETECTED Final    Enteroaggregative E coli (EAEC) NOT DETECTED NOT DETECTED Final   Enteropathogenic E coli (EPEC) NOT DETECTED NOT DETECTED Final   Enterotoxigenic E coli (ETEC) NOT DETECTED NOT DETECTED Final   Shiga like toxin producing E coli (STEC) NOT DETECTED NOT DETECTED Final   Shigella/Enteroinvasive E coli (EIEC) NOT DETECTED NOT DETECTED Final   Cryptosporidium NOT DETECTED NOT DETECTED Final   Cyclospora cayetanensis NOT DETECTED  NOT DETECTED Final   Entamoeba histolytica NOT DETECTED NOT DETECTED Final   Giardia lamblia NOT DETECTED NOT DETECTED Final   Adenovirus F40/41 NOT DETECTED NOT DETECTED Final   Astrovirus NOT DETECTED NOT DETECTED Final   Norovirus GI/GII NOT DETECTED NOT DETECTED Final   Rotavirus A NOT DETECTED NOT DETECTED Final   Sapovirus (I, II, IV, and V) NOT DETECTED NOT DETECTED Final    Comment: Performed at [facility], [address] [place], [place] [address]    Radiology Studies: No results found.  Scheduled Meds:  aspirin  EC  81 mg Oral Daily   baclofen   10 mg Oral TID   cholecalciferol   5,000 Units Oral Daily   citalopram   20 mg Oral Daily   diclofenac  Sodium  2 g Topical QID   feeding supplement  237 mL Oral BID BM   fluticasone  furoate-vilanterol  1 puff Inhalation Daily   Gerhardt's butt cream   Topical BID   heparin  injection (subcutaneous)  5,000 Units Subcutaneous Q8H   levothyroxine   200 mcg Oral QAC breakfast   spironolactone   25 mg Oral Daily   umeclidinium bromide   1 puff Inhalation Daily   Continuous Infusions:   LOS: 9 days    Time spent: 35 Mins  Darcel Dawley, MD [facility]   If 7PM-7AM, please contact night-coverage

## 2023-11-09 NOTE — Progress Notes (Signed)
   Palliative Medicine Inpatient Follow Up Note HPI: Palliative Care consult requested for goals of care discussion in this 73 y.o. female  with past medical history of chronic hypoxic and hypercarbic respiratory failure (4 L home oxygen ), nocturnal BiPAP however patient is not using, diastolic heart failure, hypothyroidism, COPD, obesity, sleep apnea, bedbound.  She was admitted on 10/30/2023 from home with worsening confusion.  Patient recently discharged on 8/15 for similar episode.  Chest x-ray showed pulmonary edema.  Requiring BiPAP.   Today's Discussion 11/09/2023  *Please note that this is a verbal dictation therefore any spelling or grammatical errors are due to the Dragon Medical One system interpretation.  Chart reviewed inclusive of vital signs, progress notes, laboratory results, and diagnostic images.   I met with Morgan Odonnell at bedside this morning in the company of her spouse, Morgan Odonnell. She is awake and alert she initially did not remember me then shared awareness of my involvement. She and I reviewed how [name] had not accepted her though an appeal is in process. She continues to share with me the desire to go home. We reviewed that this is something which would be very difficult given the amount of care she currently requires. We discussed seeing what would happen with the appeal and going from there.   Questions and concerns addressed/Palliative Support Provided.   Objective Assessment: Vital Signs Vitals:   11/09/23 1216 11/09/23 1247  BP:  (!) 129/32  Pulse: 89 88  Resp: 16 17  Temp:  98.2 F (36.8 C)  SpO2: 99% 97%    Intake/Output Summary (Last 24 hours) at 11/09/2023 1300 Last data filed at 11/09/2023 0813 Gross per 24 hour  Intake 477 ml  Output 650 ml  Net -173 ml   Last Weight  Most recent update: 10/31/2023  1:08 AM    Weight  114.4 kg (252 lb 3.3 oz)            Gen:  Elderly Caucasian F chronically ill appearing HEENT: moist mucous membranes CV:  Regular rate and rhythm  PULM:  On 5LPM [place], breathing is nonlabored ABD: soft/nontender  EXT:  Multiple areas of ecchymosis on BUE, (+) BLE dependent edema Neuro: Alert and oriented x2  SUMMARY OF RECOMMENDATIONS   DNAR/DNI  Allowing time for outcomes  Coordination of OP Palliative support on discharge  Plan for transition to [name] SNF once medically optimized  Ongoing PMT support as needed _____________________________________________________________________________________ Morgan Odonnell Palliative Medicine Team Team Cell Phone: [phone] Please utilize secure chat with additional questions, if there is no response within 30 minutes please call the above phone number  MDM: Low  Palliative Medicine Team providers are available by phone from 7am to 7pm daily and can be reached through the team cell phone.  Should this patient require assistance outside of these hours, please call the patient's attending physician.

## 2023-11-09 NOTE — Progress Notes (Signed)
 Patient placed back on cpap

## 2023-11-09 NOTE — Plan of Care (Signed)
 This patient remaisn on MC-6E as of time of writing. The patient is requiring 5 L/min of supplemental O2 via [place]. Cardiac telemetry monitoring in place. Incontinent of bowel/bladder. Lymphedema to BLE; BLE mobility limited by edema and pain. The patient requires BiPAP Q HS. Q 2 hour turns/repositioning.  Problem: Education: Goal: Knowledge of General Education information will improve Description: Including pain rating scale, medication(s)/side effects and non-pharmacologic comfort measures Outcome: Progressing   Problem: Health Behavior/Discharge Planning: Goal: Ability to manage health-related needs will improve Outcome: Progressing   Problem: Clinical Measurements: Goal: Ability to maintain clinical measurements within normal limits will improve Outcome: Progressing Goal: Will remain free from infection Outcome: Progressing Goal: Diagnostic test results will improve Outcome: Progressing Goal: Respiratory complications will improve Outcome: Progressing Goal: Cardiovascular complication will be avoided Outcome: Progressing   Problem: Activity: Goal: Risk for activity intolerance will decrease Outcome: Progressing   Problem: Nutrition: Goal: Adequate nutrition will be maintained Outcome: Progressing   Problem: Coping: Goal: Level of anxiety will decrease Outcome: Progressing   Problem: Elimination: Goal: Will not experience complications related to bowel motility Outcome: Progressing Goal: Will not experience complications related to urinary retention Outcome: Progressing   Problem: Pain Managment: Goal: General experience of comfort will improve and/or be controlled Outcome: Progressing   Problem: Safety: Goal: Ability to remain free from injury will improve Outcome: Progressing   Problem: Skin Integrity: Goal: Risk for impaired skin integrity will decrease Outcome: Progressing   Problem: Education: Goal: Knowledge of disease or condition will improve Outcome:  Progressing Goal: Knowledge of the prescribed therapeutic regimen will improve Outcome: Progressing Goal: Individualized Educational Video(s) Outcome: Progressing   Problem: Activity: Goal: Ability to tolerate increased activity will improve Outcome: Progressing Goal: Will verbalize the importance of balancing activity with adequate rest periods Outcome: Progressing   Problem: Respiratory: Goal: Ability to maintain a clear airway will improve Outcome: Progressing Goal: Levels of oxygenation will improve Outcome: Progressing Goal: Ability to maintain adequate ventilation will improve Outcome: Progressing   Problem: Education: Goal: Knowledge of disease and its progression will improve Outcome: Progressing Goal: Individualized Educational Video(s) Outcome: Progressing   Problem: Fluid Volume: Goal: Compliance with measures to maintain balanced fluid volume will improve Outcome: Progressing   Problem: Health Behavior/Discharge Planning: Goal: Ability to manage health-related needs will improve Outcome: Progressing   Problem: Nutritional: Goal: Ability to make healthy dietary choices will improve Outcome: Progressing   Problem: Clinical Measurements: Goal: Complications related to the disease process, condition or treatment will be avoided or minimized Outcome: Progressing   Problem: Education: Goal: Ability to demonstrate management of disease process will improve Outcome: Progressing Goal: Ability to verbalize understanding of medication therapies will improve Outcome: Progressing Goal: Individualized Educational Video(s) Outcome: Progressing   Problem: Activity: Goal: Capacity to carry out activities will improve Outcome: Progressing   Problem: Cardiac: Goal: Ability to achieve and maintain adequate cardiopulmonary perfusion will improve Outcome: Progressing

## 2023-11-09 NOTE — Progress Notes (Signed)
 Cpap placed on patient at 2130.  Patient continuously adjusting straps and mask.  Educated patient on the importance of wearing the mask. 0030-patient removed mask and refused to put it back on.

## 2023-11-10 DIAGNOSIS — J189 Pneumonia, unspecified organism: Secondary | ICD-10-CM | POA: Diagnosis not present

## 2023-11-10 LAB — BASIC METABOLIC PANEL WITH GFR
Anion gap: 8 (ref 5–15)
BUN: 17 mg/dL (ref 8–23)
CO2: 27 mmol/L (ref 22–32)
Calcium: 9.7 mg/dL (ref 8.9–10.3)
Chloride: 102 mmol/L (ref 98–111)
Creatinine, Ser: 1.26 mg/dL — ABNORMAL HIGH (ref 0.44–1.00)
GFR, Estimated: 46 mL/min — ABNORMAL LOW (ref >60)
Glucose, Bld: 86 mg/dL (ref 70–99)
Potassium: 3.9 mmol/L (ref 3.5–5.1)
Sodium: 137 mmol/L (ref 135–145)

## 2023-11-10 LAB — CBC
HCT: 30.2 % — ABNORMAL LOW (ref 36.0–46.0)
Hemoglobin: 9.3 g/dL — ABNORMAL LOW (ref 12.0–15.0)
MCH: 27.7 pg (ref 26.0–34.0)
MCHC: 30.8 g/dL (ref 30.0–36.0)
MCV: 89.9 fL (ref 80.0–100.0)
Platelets: 184 K/uL (ref 150–400)
RBC: 3.36 MIL/uL — ABNORMAL LOW (ref 3.87–5.11)
RDW: 17.2 % — ABNORMAL HIGH (ref 11.5–15.5)
WBC: 9 K/uL (ref 4.0–10.5)
nRBC: 0 % (ref 0.0–0.2)

## 2023-11-10 NOTE — Progress Notes (Signed)
 PROGRESS NOTE    Morgan Odonnell  FMW:[id number] DOB: 1951-03-04 DOA: 10/30/2023 PCP: Pcp, No  Brief Narrative:  This 73 years old female with history of morbid obesity, sleep apnea, obesity hypoventilation, chronic hypoxemic and hypercarbic respiratory failure on 4 L oxygen  at home, BiPAP at night but does not use it, chronic diastolic heart failure, COPD, hypothyroidism, CKD3a, bedbound status brought to the ED with worsening confusion.  Noncompliance to BiPAP at home.  Discharged with similar episode on 8/15 but family unable to care for patient at home given worsening confusion in the setting of noncompliance.   Assessment & Plan:   Principal Problem:   CAP (community acquired pneumonia) Active Problems:   Acute metabolic encephalopathy   Pressure injury of skin   Acute metabolic encephalopathy >  Improving. Acute on chronic hypoxic / hypercarbic respiratory failure: Complicated by severe sleep apnea and obesity hypoventilation: -Secondary to non-compliance. -Continue supportive care - Now compliant / tolerating BiPAP overnight / during the day - Hoping to wean patient off BiPAP dependence, trial to use only while sleeping , will make disposition to SNF difficult if she continues to be on as needed BiPAP - could consider LTAC. - She seems improved , appears back to her baseline mental status. - Patient only requires BiPAP at night.  She is now cleared medically,  awaiting SNF placement. SNF declined, son appealed against the decision. - Last night patient has not received BiPAP so seems slightly lethargic.  BiPAP resumed. - Patient seems back to her baseline mental status.  Now transitioned to 3 L of supplemental oxygen .    Acute on chronic diastolic congestive heart failure, right-sided failure: - Not in any acute exacerbation. - Continue holding diuretics. Resume at discharge.   Suspected pneumonia: -High risk for aspiration given mental status changes. -Continued   empiric coverage with Zosyn , vancomycin  given recent hospitalization and baseline respiratory status. -She completed total 5 days of antibiotics. -Patient does not meet sepsis criteria.   AKI on CKD stage IIIa : - carries former diagnosis of CKD 3A. - Renal functions improving and back to baseline.  Hypokalemia: Potassium replaced.  Continue to monitor.   COPD without acute exacerbation:  Continue nebulizer/inhaler therapy .  Hypothyroidism: Continue Synthroid .  Bilateral leg lymphedema: Chronic. Continue supportive care.   Goals of care: Discussed with patient and husband at bedside at length  given recurrent hospitalizations,  worsening mental status and noncompliance,  patient remains a remarkably high risk for decompensation. Palliative care is consulted. SNF declined.  Son has appealed against the decision.   Morbid obesity : Body mass index is 46.13 kg/m. Diet and Exercise discussed in detail.  DVT prophylaxis: Lovenox  Code Status: DNR Family Communication: Husband At bedside. Disposition Plan:    Status is: Inpatient Remains inpatient appropriate because:   Patient now medically clear, awaiting insurance authorization for SNF placement.  Consultants:  Palliative care  Procedures: None  Antimicrobials:  Anti-infectives (From admission, onward)    Start     Dose/Rate Route Frequency Ordered Stop   11/04/23 1400  levofloxacin  (LEVAQUIN ) tablet 750 mg        750 mg Oral  Once 11/04/23 1302 11/04/23 1451   11/01/23 2200  vancomycin  (VANCOREADY) IVPB 1750 mg/350 mL  Status:  Discontinued        1,750 mg 175 mL/hr over 120 Minutes Intravenous Every 48 hours 10/31/23 0333 11/04/23 1302   10/31/23 0600  piperacillin -tazobactam (ZOSYN ) IVPB 3.375 g  Status:  Discontinued  3.375 g 12.5 mL/hr over 240 Minutes Intravenous Every 8 hours 10/31/23 0118 11/04/23 1302   10/30/23 2215  piperacillin -tazobactam (ZOSYN ) IVPB 3.375 g        3.375 g 100 mL/hr over 30  Minutes Intravenous  Once 10/30/23 2200 10/30/23 2241   10/30/23 2215  vancomycin  (VANCOREADY) IVPB 1750 mg/350 mL        1,750 mg 175 mL/hr over 120 Minutes Intravenous  Once 10/30/23 2200 10/31/23 0153      Subjective: Patient was seen and examined at bedside. Overnight events noted. Patient is lying comfortably in the bed,  back to her baseline.  Remains on 3 L. She is awaiting appeal decision regarding SNF decline.  Objective: Vitals:   11/10/23 0406 11/10/23 0520 11/10/23 0726 11/10/23 1056  BP: 120/66  129/63   Pulse: 73 76    Resp: 17 19    Temp: (!) 97.2 F (36.2 C)  98.3 F (36.8 C) 98.8 F (37.1 C)  TempSrc: Oral  Oral Oral  SpO2: 92% 91%  92%  Weight: 120.6 kg     Height:        Intake/Output Summary (Last 24 hours) at 11/10/2023 1130 Last data filed at 11/10/2023 1057 Gross per 24 hour  Intake 720 ml  Output 2450 ml  Net -1730 ml   Filed Weights   10/31/23 0107 11/10/23 0406  Weight: 114.4 kg 120.6 kg    Examination:  General exam: Appears calm and comfortable, not in any acute distress.  Morbidly obese. Respiratory system: CTA Bilaterally . Respiratory effort normal.  RR 13 Cardiovascular system: S1 & S2 heard, RRR. No JVD, murmurs, rubs, gallops or clicks.  Gastrointestinal system: Abdomen is non distended, soft and nontender. Normal bowel sounds heard. Central nervous system: Alert and oriented x 2,  no focal neurological deficits. Extremities: Edema+, no cyanosis, no clubbing Skin: No rashes, lesions or ulcers Psychiatry: Mood & affect appropriate.    Data Reviewed: I have personally reviewed following labs and imaging studies  CBC: Recent Labs  Lab 11/04/23 0648 11/06/23 0518 11/08/23 0453 11/10/23 0306  WBC 10.3 9.5 10.6* 9.0  HGB 10.1* 9.6* 9.6* 9.3*  HCT 32.7* 30.4* 31.1* 30.2*  MCV 88.1 86.9 88.9 89.9  PLT 201 168 181 184   Basic Metabolic Panel: Recent Labs  Lab 11/04/23 0648 11/06/23 0518 11/08/23 0453 11/10/23 0306  NA 137  137 138 137  K 3.6 3.5 3.5 3.9  CL 105 105 104 102  CO2 23 24 27 27   GLUCOSE 83 93 89 86  BUN 12 12 15 17   CREATININE 1.33* 1.22* 1.30* 1.26*  CALCIUM  9.0 9.4 9.8 9.7  MG 1.7  --   --   --   PHOS 3.4  --   --   --    GFR: Estimated Creatinine Clearance: 50.6 mL/min (A) (by C-G formula based on SCr of 1.26 mg/dL (H)). Liver Function Tests: No results for input(s): AST, ALT, ALKPHOS, BILITOT, PROT, ALBUMIN in the last 168 hours.  No results for input(s): LIPASE, AMYLASE in the last 168 hours. No results for input(s): AMMONIA in the last 168 hours. Coagulation Profile: No results for input(s): INR, PROTIME in the last 168 hours. Cardiac Enzymes: No results for input(s): CKTOTAL, CKMB, CKMBINDEX, TROPONINI in the last 168 hours.  BNP (last 3 results) No results for input(s): PROBNP in the last 8760 hours. HbA1C: No results for input(s): HGBA1C in the last 72 hours. CBG: No results for input(s): GLUCAP in the last 168  hours. Lipid Profile: No results for input(s): CHOL, HDL, LDLCALC, TRIG, CHOLHDL, LDLDIRECT in the last 72 hours. Thyroid  Function Tests: No results for input(s): TSH, T4TOTAL, FREET4, T3FREE, THYROIDAB in the last 72 hours. Anemia Panel: No results for input(s): VITAMINB12, FOLATE, FERRITIN, TIBC, IRON, RETICCTPCT in the last 72 hours. Sepsis Labs: No results for input(s): PROCALCITON, LATICACIDVEN in the last 168 hours.   Recent Results (from the past 240 hours)  Gastrointestinal Panel by PCR , Stool     Status: None   Collection Time: 11/01/23  7:39 AM   Specimen: Stool  Result Value Ref Range Status   Campylobacter species NOT DETECTED NOT DETECTED Final   Plesimonas shigelloides NOT DETECTED NOT DETECTED Final   Salmonella species NOT DETECTED NOT DETECTED Final   Yersinia enterocolitica NOT DETECTED NOT DETECTED Final   Vibrio species NOT DETECTED NOT DETECTED Final   Vibrio  cholerae NOT DETECTED NOT DETECTED Final   Enteroaggregative E coli (EAEC) NOT DETECTED NOT DETECTED Final   Enteropathogenic E coli (EPEC) NOT DETECTED NOT DETECTED Final   Enterotoxigenic E coli (ETEC) NOT DETECTED NOT DETECTED Final   Shiga like toxin producing E coli (STEC) NOT DETECTED NOT DETECTED Final   Shigella/Enteroinvasive E coli (EIEC) NOT DETECTED NOT DETECTED Final   Cryptosporidium NOT DETECTED NOT DETECTED Final   Cyclospora cayetanensis NOT DETECTED NOT DETECTED Final   Entamoeba histolytica NOT DETECTED NOT DETECTED Final   Giardia lamblia NOT DETECTED NOT DETECTED Final   Adenovirus F40/41 NOT DETECTED NOT DETECTED Final   Astrovirus NOT DETECTED NOT DETECTED Final   Norovirus GI/GII NOT DETECTED NOT DETECTED Final   Rotavirus A NOT DETECTED NOT DETECTED Final   Sapovirus (I, II, IV, and V) NOT DETECTED NOT DETECTED Final    Comment: Performed at [facility], [address] [place], [place] [address]    Radiology Studies: No results found.  Scheduled Meds:  aspirin  EC  81 mg Oral Daily   baclofen   10 mg Oral TID   cholecalciferol   5,000 Units Oral Daily   citalopram   20 mg Oral Daily   diclofenac  Sodium  2 g Topical QID   feeding supplement  237 mL Oral BID BM   fluticasone  furoate-vilanterol  1 puff Inhalation Daily   Gerhardt's butt cream   Topical BID   heparin  injection (subcutaneous)  5,000 Units Subcutaneous Q8H   levothyroxine   200 mcg Oral QAC breakfast   spironolactone   25 mg Oral Daily   umeclidinium bromide   1 puff Inhalation Daily   Continuous Infusions:   LOS: 10 days    Time spent: 35 Mins  Darcel Dawley, MD [facility]   If 7PM-7AM, please contact night-coverage

## 2023-11-10 NOTE — TOC Progression Note (Signed)
 Transition of Care [facility]) - Progression Note    Patient Details  Name: Morgan Odonnell MRN: [id number] Date of Birth: 07/17/1950  Transition of [facility] CM/SW Contact  Isaiah Public, LCSWA Phone Number: 11/10/2023, 3:44 PM  Clinical Narrative:     CSW spoke with patients son [name]. Appeal currently still pending. CSW will continue to follow.  Expected Discharge Plan: Skilled Nursing Facility Barriers to Discharge: Continued Medical Work up               Expected Discharge Plan and Services In-house Referral: Clinical Social Work     Living arrangements for the past 2 months: Single Family Home                                       Social Drivers of Health (SDOH) Interventions SDOH Screenings   Food Insecurity: No Food Insecurity (10/31/2023)  Housing: Low Risk  (10/31/2023)  Transportation Needs: Unmet Transportation Needs (10/31/2023)  Utilities: Not At Risk (10/31/2023)  Depression (PHQ2-9): Medium Risk (01/13/2019)  Social Connections: Moderately Isolated (10/31/2023)  Tobacco Use: Medium Risk (10/31/2023)    Readmission Risk Interventions    09/19/2023    1:52 PM 08/17/2023    9:31 AM  Readmission Risk Prevention Plan  Transportation Screening Complete Complete  PCP or Specialist Appt within 5-7 Days  Complete  PCP or Specialist Appt within 3-5 Days Complete   Home Care Screening  Complete  Medication Review (RN CM)  Complete  HRI or Home Care Consult Complete   Social Work Consult for Recovery Care Planning/Counseling Complete   Palliative Care Screening Complete   Medication Review Oceanographer) Complete

## 2023-11-11 DIAGNOSIS — J189 Pneumonia, unspecified organism: Secondary | ICD-10-CM | POA: Diagnosis not present

## 2023-11-11 NOTE — Progress Notes (Signed)
 PROGRESS NOTE    Morgan Odonnell  FMW:[id number] DOB: 02-24-1951 DOA: 10/30/2023 PCP: Pcp, No  Brief Narrative:  This 72 years old female with history of morbid obesity, sleep apnea, obesity hypoventilation, chronic hypoxemic and hypercarbic respiratory failure on 4 L oxygen  at home, BiPAP at night but does not use it, chronic diastolic heart failure, COPD, hypothyroidism, CKD3a, bedbound status brought to the ED with worsening confusion.  Noncompliance to BiPAP at home.  Discharged with similar episode on 8/15 but family unable to care for patient at home given worsening confusion in the setting of noncompliance.  Patient was readmitted.  Assessment & Plan:   Principal Problem:   CAP (community acquired pneumonia) Active Problems:   Acute metabolic encephalopathy   Pressure injury of skin   Acute metabolic encephalopathy >  Improving. Acute on chronic hypoxic / hypercarbic respiratory failure: Complicated by severe sleep apnea and obesity hypoventilation: -Secondary to non-compliance. -Continue supportive care - Now compliant / tolerating BiPAP overnight / during the day - She seems improved , appears back to her baseline mental status. - Patient only requires BiPAP at night.  She is now cleared medically,  awaiting SNF placement. SNF declined, son appealed against the decision. - Awaiting Appeal decision for discharge to SNF   Acute on chronic diastolic congestive heart failure, right-sided failure: - Not in any acute exacerbation. - Continue holding diuretics. Resume at discharge.   Suspected pneumonia: -High risk for aspiration given mental status changes. -Continued  empiric coverage with Zosyn , vancomycin  given recent hospitalization and baseline respiratory status. -She completed total 5 days of antibiotics. -Patient does not meet sepsis criteria.   AKI on CKD stage IIIa : - carries former diagnosis of CKD 3A. - Renal functions improving and back to  baseline.  Hypokalemia: Potassium replaced.  Continue to monitor.   COPD without acute exacerbation:  Continue nebulizer/inhaler therapy .  Hypothyroidism: Continue Synthroid .  Bilateral leg lymphedema: Chronic. Continue supportive care.   Goals of care: Discussed with patient and husband at bedside at length  given recurrent hospitalizations,  worsening mental status and noncompliance,  patient remains a remarkably high risk for decompensation. Palliative care is consulted. Patient not wanting hospice SNF declined.  Son has appealed against the decision.   Morbid obesity : Body mass index is 46.13 kg/m. Diet and Exercise discussed in detail.  DVT prophylaxis: Lovenox  Code Status: DNR Family Communication: Husband At bedside. Disposition Plan:    Status is: Inpatient Remains inpatient appropriate because:   Patient now medically clear, awaiting insurance authorization for SNF placement.  Consultants:  Palliative care  Procedures: None  Antimicrobials:  Anti-infectives (From admission, onward)    Start     Dose/Rate Route Frequency Ordered Stop   11/04/23 1400  levofloxacin  (LEVAQUIN ) tablet 750 mg        750 mg Oral  Once 11/04/23 1302 11/04/23 1451   11/01/23 2200  vancomycin  (VANCOREADY) IVPB 1750 mg/350 mL  Status:  Discontinued        1,750 mg 175 mL/hr over 120 Minutes Intravenous Every 48 hours 10/31/23 0333 11/04/23 1302   10/31/23 0600  piperacillin -tazobactam (ZOSYN ) IVPB 3.375 g  Status:  Discontinued        3.375 g 12.5 mL/hr over 240 Minutes Intravenous Every 8 hours 10/31/23 0118 11/04/23 1302   10/30/23 2215  piperacillin -tazobactam (ZOSYN ) IVPB 3.375 g        3.375 g 100 mL/hr over 30 Minutes Intravenous  Once 10/30/23 2200 10/30/23 2241   10/30/23 2215  vancomycin  (  VANCOREADY) IVPB 1750 mg/350 mL        1,750 mg 175 mL/hr over 120 Minutes Intravenous  Once 10/30/23 2200 10/31/23 0153      Subjective: Patient was seen and examined at bedside.  Overnight events noted. Patient is lying comfortably in the bed,  back to her baseline.  Remains on 3 L. She is awaiting appeal decision regarding SNF decline.  Objective: Vitals:   11/10/23 2227 11/10/23 2245 11/11/23 0327 11/11/23 0819  BP:  (!) 111/47 122/64 (!) 124/53  Pulse: 95 96 87 83  Resp: 17 18 (!) 21 17  Temp:  98.2 F (36.8 C) 98.1 F (36.7 C) 98.7 F (37.1 C)  TempSrc:  Axillary Axillary Oral  SpO2:  (!) 80% 96% 100%  Weight:      Height:        Intake/Output Summary (Last 24 hours) at 11/11/2023 1143 Last data filed at 11/11/2023 9177 Gross per 24 hour  Intake 1080 ml  Output 1700 ml  Net -620 ml   Filed Weights   10/31/23 0107 11/10/23 0406  Weight: 114.4 kg 120.6 kg    Examination:  General exam: Appears calm and comfortable, not in any acute distress.  Morbidly obese. Respiratory system: CTA Bilaterally . Respiratory effort normal.  RR 14 Cardiovascular system: S1 & S2 heard, RRR. No JVD, murmurs, rubs, gallops or clicks.  Gastrointestinal system: Abdomen is non distended, soft and nontender. Normal bowel sounds heard. Central nervous system: Alert and oriented x 2,  no focal neurological deficits. Extremities: Edema+, no cyanosis, no clubbing Skin: No rashes, lesions or ulcers Psychiatry: Mood & affect appropriate.    Data Reviewed: I have personally reviewed following labs and imaging studies  CBC: Recent Labs  Lab 11/06/23 0518 11/08/23 0453 11/10/23 0306  WBC 9.5 10.6* 9.0  HGB 9.6* 9.6* 9.3*  HCT 30.4* 31.1* 30.2*  MCV 86.9 88.9 89.9  PLT 168 181 184   Basic Metabolic Panel: Recent Labs  Lab 11/06/23 0518 11/08/23 0453 11/10/23 0306  NA 137 138 137  K 3.5 3.5 3.9  CL 105 104 102  CO2 24 27 27   GLUCOSE 93 89 86  BUN 12 15 17   CREATININE 1.22* 1.30* 1.26*  CALCIUM  9.4 9.8 9.7   GFR: Estimated Creatinine Clearance: 50.6 mL/min (A) (by C-G formula based on SCr of 1.26 mg/dL (H)). Liver Function Tests: No results for input(s):  AST, ALT, ALKPHOS, BILITOT, PROT, ALBUMIN in the last 168 hours.  No results for input(s): LIPASE, AMYLASE in the last 168 hours. No results for input(s): AMMONIA in the last 168 hours. Coagulation Profile: No results for input(s): INR, PROTIME in the last 168 hours. Cardiac Enzymes: No results for input(s): CKTOTAL, CKMB, CKMBINDEX, TROPONINI in the last 168 hours.  BNP (last 3 results) No results for input(s): PROBNP in the last 8760 hours. HbA1C: No results for input(s): HGBA1C in the last 72 hours. CBG: No results for input(s): GLUCAP in the last 168 hours. Lipid Profile: No results for input(s): CHOL, HDL, LDLCALC, TRIG, CHOLHDL, LDLDIRECT in the last 72 hours. Thyroid  Function Tests: No results for input(s): TSH, T4TOTAL, FREET4, T3FREE, THYROIDAB in the last 72 hours. Anemia Panel: No results for input(s): VITAMINB12, FOLATE, FERRITIN, TIBC, IRON, RETICCTPCT in the last 72 hours. Sepsis Labs: No results for input(s): PROCALCITON, LATICACIDVEN in the last 168 hours.   No results found for this or any previous visit (from the past 240 hours).   Radiology Studies: No results found.  Scheduled Meds:  aspirin  EC  81 mg Oral Daily   baclofen   10 mg Oral TID   cholecalciferol   5,000 Units Oral Daily   citalopram   20 mg Oral Daily   diclofenac  Sodium  2 g Topical QID   feeding supplement  237 mL Oral BID BM   fluticasone  furoate-vilanterol  1 puff Inhalation Daily   Gerhardt's butt cream   Topical BID   heparin  injection (subcutaneous)  5,000 Units Subcutaneous Q8H   levothyroxine   200 mcg Oral QAC breakfast   spironolactone   25 mg Oral Daily   umeclidinium bromide   1 puff Inhalation Daily   Continuous Infusions:   LOS: 11 days    Time spent: 35 Mins  Darcel Dawley, MD [facility]   If 7PM-7AM, please contact night-coverage

## 2023-11-11 NOTE — Progress Notes (Signed)
 Physical Therapy Treatment Patient Details Name: Morgan Odonnell MRN: [id number] DOB: 07-05-1950 Today's Date: 11/11/2023   History of Present Illness 73 y.o female admitted 10/30/23 with AMS, CAP. 8/22 hypokalemia. PMH: admission 7/11-7/18 with D/C to SNF, return home 8/3, readmit 8/7-8/15 with AMS. CHF, OSA, COPD, hypothyroidism, obesity, chronic respiratory failure on 4L, depression,chronic back pain, HTN.    PT Comments  Pt received in supine, pleasantly cooperative and agreeable to therapy session, spouse present and receptive also to instruction. Emphasis on use of hospital bed features to work on UE/LE strengthening and repositioning for better posture and body mechanics. +2 totalA for posterior supine scooting toward HOB with bed in trend then pt needing up to modA from elevated HOB posture to long sitting with bed side rails and modA trunk support. Multiple UE/LE exercises with A/AAROM and good effort, SpO2 87% and above on 6L O2 [place] with exercises in supine. DOE 2-3/4 with bed mobility and UE exercises. RN notified pt would benefit from bariatric chair for her room for comfort with plan for OOB transfer next session, also plan to bring bed/chair UE/LE HEP next session for pt to continue. Patient will benefit from continued inpatient follow up therapy, <3 hours/day.     If plan is discharge home, recommend the following: Two people to help with walking and/or transfers;Assist for transportation;Supervision due to cognitive status;Two people to help with bathing/dressing/bathroom;Direct supervision/assist for financial management;Direct supervision/assist for medications management;Other (comment)   Can travel by private vehicle     No  Equipment Recommendations  Hoyer lift;Hospital bed    Recommendations for Other Services       Precautions / Restrictions Precautions Precautions: Fall;Other (comment) Recall of Precautions/Restrictions: Impaired Precaution/Restrictions Comments:  Fragile skin, LE lymphedema, chronic O2 [place] at home Restrictions Weight Bearing Restrictions Per Provider Order: No     Mobility  Bed Mobility Overal bed mobility: Needs Assistance Bed Mobility: Supine to Sit           General bed mobility comments: +2 totalA for posterior supine scooting toward HOB with bed pad assist and bed in trend, pt cued to use BLE to push with mattress/bridge but not able to achieve propulsion, pt arms crossed at chest as spouse reports her shoulders can't reach well above her head to reach bed rail. Pt attempting supine to long sitting x10 reps for bed crunches with bil side rails and trunk support, needing modA to achieve upright while pillow adjusted behind her on final rep for better lumbar support.    Transfers Overall transfer level: Needs assistance                 General transfer comment: did not have time to attempt after repositioning and supine exercises, so pt positioned in bed chair posture at end of session.    Ambulation/Gait                   Stairs             Wheelchair Mobility     Tilt Bed    Modified Rankin (Stroke Patients Only)       Balance Overall balance assessment: Needs assistance Sitting-balance support: Bilateral upper extremity supported Sitting balance-Leahy Scale: Poor Sitting balance - Comments: long sitting due to pelvic posture in bed chair position, improved upright posture after pillows placed under LUE for extra support, pt had tendency to L lean while upright today. Postural control: Left lateral lean     Standing balance comment: defer  due to not enough time                            Communication Communication Communication: No apparent difficulties  Cognition Arousal: Alert Behavior During Therapy: WFL for tasks assessed/performed   PT - Cognitive impairments: Awareness, Memory, Problem solving, Safety/Judgement   Orientation impairments: Time, Situation                    PT - Cognition Comments: Pt pleasantly cooperative;some tangential statements. Pt spouse present and helpful/encouraging. Following commands: Impaired Following commands impaired: Follows one step commands with increased time    Cueing Cueing Techniques: Verbal cues, Tactile cues, Gestural cues  Exercises Other Exercises Other Exercises: seated (upright chair posture in bed) UE AROM: shoulder/elbow flex/ext bicycle motion with arms x10 reps, then with gentle resistance push-pull elbow flex/ext x10 reps additional for strengthening Other Exercises: bed crunches with BUE on side rails and cues for core activation and breathing x5 reps x2 sets attempting to get to long sit each rep Other Exercises: seated BLE AROM: LAQ, heel slides, hip IR and ADduction, poor ROM on LLE for AAROM hip abduction attempts x10 reps ea    General Comments General comments (skin integrity, edema, etc.): Pt instructed on floating heels for pressure relief. supine/seated UE/LE exercises for strengthening and frequency (needs handout next session), and benefits of upright posture throughout the day via transfer to chair or bed chair posture.      Pertinent Vitals/Pain Pain Assessment Pain Assessment: Faces Faces Pain Scale: Hurts a little bit Pain Location: generalized LE/joint pain with exercises Pain Descriptors / Indicators: Discomfort, Aching, Sore, Guarding Pain Intervention(s): Limited activity within patient's tolerance, Monitored during session, Repositioned    Home Living                          Prior Function            PT Goals (current goals can now be found in the care plan section) Acute Rehab PT Goals Patient Stated Goal: be able to to move PT Goal Formulation: With patient Time For Goal Achievement: 11/14/23 Progress towards PT goals: Progressing toward goals    Frequency    Min 2X/week      PT Plan      Co-evaluation               AM-PAC PT 6 Clicks Mobility   Outcome Measure  Help needed turning from your back to your side while in a flat bed without using bedrails?: A Lot Help needed moving from lying on your back to sitting on the side of a flat bed without using bedrails?: A Lot Help needed moving to and from a bed to a chair (including a wheelchair)?: Total Help needed standing up from a chair using your arms (e.g., wheelchair or bedside chair)?: Total Help needed to walk in hospital room?: Total Help needed climbing 3-5 steps with a railing? : Total 6 Click Score: 8    End of Session Equipment Utilized During Treatment: Oxygen  Activity Tolerance: Patient tolerated treatment well;Patient limited by fatigue Patient left: in bed;with call bell/phone within reach;with bed alarm set;with family/visitor present;Other (comment) (bed in chair posture, LUE supported to reduce L lean) Nurse Communication: Mobility status;Need for lift equipment;Other (comment) (pt needs bariatric size chair, would be helpful to have wider blue chair with arms that drop down for pt safety/comfort to  get OOB) PT Visit Diagnosis: Muscle weakness (generalized) (M62.81);Difficulty in walking, not elsewhere classified (R26.2)     Time: 8267-8253 PT Time Calculation (min) (ACUTE ONLY): 14 min  Charges:    $Therapeutic Exercise: 8-22 mins PT General Charges $$ ACUTE PT VISIT: 1 Visit                     Letti Towell P., PTA Acute Rehabilitation Services Secure Chat Preferred 9a-5:30pm Office: [phone]    Connell HERO [facility] 11/11/2023, 6:31 PM

## 2023-11-11 NOTE — TOC Progression Note (Addendum)
 Transition of Care [facility]) - Progression Note    Patient Details  Name: Morgan Odonnell MRN: [id number] Date of Birth: 03-03-1951  Transition of Care [facility]) CM/SW Contact  Isaiah Public, LCSWA Phone Number: 11/11/2023, 11:20 AM  Clinical Narrative:     CSW spoke with patients son [name] who informed CSW that he has not heard anything back from patients insurance yet. Appeal still currently pending. Patients son gave CSW permission to follow up with patients insurance this afternoon if have not heard anything back. CSW updated patients spouse [name]. CSW will continue to follow.  Update- CSW spoke with [facility] to check on status of patients appeal. Patients insurance informed CSW that patients appeal was overturned authorization # [id number], Case number [id number]. CSW informed Shona with [facility]. Shona called to verify since the approval is not showing up in insurance portal yet. Rhonda with [facility] called CSW back and informed CSW that patients insurance told her it is still currently pending. CSW informed MD and patients son. CSW to follow up tomorrow.   Expected Discharge Plan: Skilled Nursing Facility Barriers to Discharge: Continued Medical Work up               Expected Discharge Plan and Services In-house Referral: Clinical Social Work     Living arrangements for the past 2 months: Single Family Home                                       Social Drivers of Health (SDOH) Interventions SDOH Screenings   Food Insecurity: No Food Insecurity (10/31/2023)  Housing: Low Risk  (10/31/2023)  Transportation Needs: Unmet Transportation Needs (10/31/2023)  Utilities: Not At Risk (10/31/2023)  Depression (PHQ2-9): Medium Risk (01/13/2019)  Social Connections: Moderately Isolated (10/31/2023)  Tobacco Use: Medium Risk (10/31/2023)    Readmission Risk Interventions    09/19/2023    1:52 PM 08/17/2023    9:31 AM  Readmission Risk Prevention Plan  Transportation  Screening Complete Complete  PCP or Specialist Appt within 5-7 Days  Complete  PCP or Specialist Appt within 3-5 Days Complete   Home Care Screening  Complete  Medication Review (RN CM)  Complete  HRI or Home Care Consult Complete   Social Work Consult for Recovery Care Planning/Counseling Complete   Palliative Care Screening Complete   Medication Review Oceanographer) Complete

## 2023-11-11 NOTE — Plan of Care (Signed)
  Problem: Education: Goal: Knowledge of General Education information will improve Description: Including pain rating scale, medication(s)/side effects and non-pharmacologic comfort measures Outcome: Progressing   Problem: Health Behavior/Discharge Planning: Goal: Ability to manage health-related needs will improve Outcome: Progressing   Problem: Clinical Measurements: Goal: Ability to maintain clinical measurements within normal limits will improve Outcome: Progressing Goal: Will remain free from infection Outcome: Progressing Goal: Diagnostic test results will improve Outcome: Progressing Goal: Respiratory complications will improve Outcome: Progressing Goal: Cardiovascular complication will be avoided Outcome: Progressing   Problem: Activity: Goal: Risk for activity intolerance will decrease Outcome: Progressing   Problem: Nutrition: Goal: Adequate nutrition will be maintained Outcome: Progressing   Problem: Coping: Goal: Level of anxiety will decrease Outcome: Progressing   Problem: Elimination: Goal: Will not experience complications related to bowel motility Outcome: Progressing Goal: Will not experience complications related to urinary retention Outcome: Progressing   Problem: Pain Managment: Goal: General experience of comfort will improve and/or be controlled Outcome: Progressing   Problem: Safety: Goal: Ability to remain free from injury will improve Outcome: Progressing   Problem: Skin Integrity: Goal: Risk for impaired skin integrity will decrease Outcome: Progressing   Problem: Education: Goal: Knowledge of disease or condition will improve Outcome: Progressing Goal: Knowledge of the prescribed therapeutic regimen will improve Outcome: Progressing Goal: Individualized Educational Video(s) Outcome: Progressing   Problem: Activity: Goal: Ability to tolerate increased activity will improve Outcome: Progressing Goal: Will verbalize the  importance of balancing activity with adequate rest periods Outcome: Progressing   Problem: Respiratory: Goal: Ability to maintain a clear airway will improve Outcome: Progressing Goal: Levels of oxygenation will improve Outcome: Progressing Goal: Ability to maintain adequate ventilation will improve Outcome: Progressing   Problem: Education: Goal: Knowledge of disease and its progression will improve Outcome: Progressing Goal: Individualized Educational Video(s) Outcome: Progressing   Problem: Fluid Volume: Goal: Compliance with measures to maintain balanced fluid volume will improve Outcome: Progressing   Problem: Health Behavior/Discharge Planning: Goal: Ability to manage health-related needs will improve Outcome: Progressing   Problem: Nutritional: Goal: Ability to make healthy dietary choices will improve Outcome: Progressing   Problem: Clinical Measurements: Goal: Complications related to the disease process, condition or treatment will be avoided or minimized Outcome: Progressing   Problem: Education: Goal: Ability to demonstrate management of disease process will improve Outcome: Progressing Goal: Ability to verbalize understanding of medication therapies will improve Outcome: Progressing Goal: Individualized Educational Video(s) Outcome: Progressing   Problem: Activity: Goal: Capacity to carry out activities will improve Outcome: Progressing   Problem: Cardiac: Goal: Ability to achieve and maintain adequate cardiopulmonary perfusion will improve Outcome: Progressing

## 2023-11-11 NOTE — Plan of Care (Signed)
   Problem: Clinical Measurements: Goal: Will remain free from infection Outcome: Progressing Goal: Diagnostic test results will improve Outcome: Progressing   Problem: Coping: Goal: Level of anxiety will decrease Outcome: Progressing   Problem: Safety: Goal: Ability to remain free from injury will improve Outcome: Progressing

## 2023-11-11 NOTE — Care Management Important Message (Signed)
 Important Message  Patient Details  Name: Morgan Odonnell MRN: [id number] Date of Birth: 1950/07/21   Important Message Given:  Yes - Medicare IM     [name] 11/11/2023, 9:35 AM

## 2023-11-12 ENCOUNTER — Encounter: Payer: Self-pay | Admitting: Emergency Medicine

## 2023-11-12 DIAGNOSIS — J418 Mixed simple and mucopurulent chronic bronchitis: Secondary | ICD-10-CM | POA: Diagnosis not present

## 2023-11-12 DIAGNOSIS — J189 Pneumonia, unspecified organism: Secondary | ICD-10-CM | POA: Diagnosis not present

## 2023-11-12 DIAGNOSIS — G9341 Metabolic encephalopathy: Secondary | ICD-10-CM | POA: Diagnosis not present

## 2023-11-12 MED ORDER — ENSURE PLUS HIGH PROTEIN PO LIQD: 237.0000 mL | Freq: Two times a day (BID) | ORAL | Status: DC

## 2023-11-12 NOTE — TOC Progression Note (Addendum)
 Transition of Care [facility]) - Progression Note    Patient Details  Name: Morgan Odonnell MRN: [id number] Date of Birth: 1950/07/30  Transition of Care [facility]) CM/SW Contact  Isaiah Public, LCSWA Phone Number: 11/12/2023, 10:00 AM  Clinical Narrative:     CSW was informed by Shona with [facility] that [employer] rep. Informed her that they have requested to have appeal updated. CSW will continue to follow.   Update- Patients appeal has been overturned plan Auth ID# [id number] Auth ID# [id number]. Insurance authorization approved from 8/29-9/4. Rhonda with [facility] confirmed patient can dc over today if medically stable. CSW informed MD.  Expected Discharge Plan: Skilled Nursing Facility Barriers to Discharge: Continued Medical Work up               Expected Discharge Plan and Services In-house Referral: Clinical Social Work     Living arrangements for the past 2 months: Single Family Home                                       Social Drivers of Health (SDOH) Interventions SDOH Screenings   Food Insecurity: No Food Insecurity (10/31/2023)  Housing: Low Risk  (10/31/2023)  Transportation Needs: Unmet Transportation Needs (10/31/2023)  Utilities: Not At Risk (10/31/2023)  Depression (PHQ2-9): Medium Risk (01/13/2019)  Social Connections: Moderately Isolated (10/31/2023)  Tobacco Use: Medium Risk (10/31/2023)    Readmission Risk Interventions    09/19/2023    1:52 PM 08/17/2023    9:31 AM  Readmission Risk Prevention Plan  Transportation Screening Complete Complete  PCP or Specialist Appt within 5-7 Days  Complete  PCP or Specialist Appt within 3-5 Days Complete   Home Care Screening  Complete  Medication Review (RN CM)  Complete  HRI or Home Care Consult Complete   Social Work Consult for Recovery Care Planning/Counseling Complete   Palliative Care Screening Complete   Medication Review Oceanographer) Complete

## 2023-11-12 NOTE — Progress Notes (Signed)
 Report called to Vernell at [phone] DC packet on chart. Along with DNR

## 2023-11-12 NOTE — Progress Notes (Signed)
   Palliative Medicine Inpatient Follow Up Note HPI: Palliative Care consult requested for goals of care discussion in this 73 y.o. female  with past medical history of chronic hypoxic and hypercarbic respiratory failure (4 L home oxygen ), nocturnal BiPAP however patient is not using, diastolic heart failure, hypothyroidism, COPD, obesity, sleep apnea, bedbound.  She was admitted on 10/30/2023 from home with worsening confusion.  Patient recently discharged on 8/15 for similar episode.  Chest x-ray showed pulmonary edema.  Requiring BiPAP.   Today's Discussion 11/12/2023  *Please note that this is a verbal dictation therefore any spelling or grammatical errors are due to the Dragon Medical One system interpretation.  Chart reviewed inclusive of vital signs, progress notes, laboratory results, and diagnostic images.   I met this late afternoon with Morgan Odonnell and her spouse, Morgan Odonnell.   Daurice is in the process of getting out of bed. She is working with both PT/OT. I was able to observe their time with the patient. We discussed the fear of falling that Morgan Odonnell has. She and I reviewed the importance of daily mobility otherwise muscle deterioration will continue to occur. She and I reviewed that if Morgan Odonnell does not get up and start moving more that her clinical outcomes will be poor. She shares understanding.  Additionally, Morgan Odonnell and I addressed the need for her to continue to use CPAP nightly. I shared if she neglects to do this that she will be in the same position in a relatively short period of time.  We reviewed the plan for rehabilitation and the she will go to [name] as early as later today.  OP Palliative care through [facility] will be initiated on discharge.   Questions and concerns addressed/Palliative Support Provided.   Objective Assessment: Vital Signs Vitals:   11/12/23 0843 11/12/23 1242  BP: (!) 119/58 126/75  Pulse: 89 78  Resp: 18 20  Temp: 98.3 F (36.8 C)  97.9 F (36.6 C)  SpO2: 91% 96%    Intake/Output Summary (Last 24 hours) at 11/12/2023 1333 Last data filed at 11/12/2023 1213 Gross per 24 hour  Intake 480 ml  Output 800 ml  Net -320 ml   Last Weight  Most recent update: 11/10/2023  4:16 AM    Weight  120.6 kg (265 lb 14 oz)            Gen:  Elderly Caucasian F chronically ill appearing HEENT: moist mucous membranes CV: Regular rate and rhythm  PULM:  On 5LPM [place], breathing is nonlabored ABD: soft/nontender  EXT:  Multiple areas of ecchymosis on BUE, (+) BLE dependent edema Neuro: Alert and oriented x2  SUMMARY OF RECOMMENDATIONS   DNAR/DNI  Allowing time for outcomes  Coordination of OP Palliative support on discharge  Plan for transition to [name] once medically optimized  Ongoing PMT support as needed _____________________________________________________________________________________ Morgan Odonnell [place] Palliative Medicine Team Team Cell Phone: [phone] Please utilize secure chat with additional questions, if there is no response within 30 minutes please call the above phone number  Time: 1  Palliative Medicine Team providers are available by phone from 7am to 7pm daily and can be reached through the team cell phone.  Should this patient require assistance outside of these hours, please call the patient's attending physician.

## 2023-11-12 NOTE — Progress Notes (Signed)
 Physical Therapy Treatment Patient Details Name: Morgan Odonnell MRN: [id number] DOB: 1950-09-23 Today's Date: 11/12/2023   History of Present Illness 73 y.o female admitted 10/30/23 with AMS, CAP. 8/22 hypokalemia. PMH: admission 7/11-7/18 with D/C to SNF, return home 8/3, readmit 8/7-8/15 with AMS. CHF, OSA, COPD, hypothyroidism, obesity, chronic respiratory failure on 4L, depression,chronic back pain, HTN.    PT Comments  Pt received in supine, alert and partially oriented, pt reports no recall of PT session previous date, so handout brought to reinforce supine exercises reviewed, given to spouse who was in room to work on with her between sessions. Pt anxious at prospect of attempting transfers but able to perform STS and step pivot x2 trials toward chair on her L side, with +2 modA progressing to +2 maxA for weight shifting and safer LLE foot/leg placement. Pt quick to fatigue and hypoxic on 5L/min O2 [place], needing increase to 6L/min to complete transfer training. Pt with good participation as able with encouragement, RN notified of safe technique for return transfer later in day, recommend pt sit up 1-2 hours in chair to build endurance and improve pulmonary clearance, pt/spouse notified. Patient will benefit from continued inpatient follow up therapy, <3 hours/day, making good progress toward goals. Plan to work on transfers with close chair follow for safety to progress standing tolerance next session, bari RW at lowest setting would be helpful to bring.   If plan is discharge home, recommend the following: Two people to help with walking and/or transfers;Assist for transportation;Supervision due to cognitive status;Two people to help with bathing/dressing/bathroom;Direct supervision/assist for financial management;Direct supervision/assist for medications management;Other (comment)   Can travel by private vehicle     No  Equipment Recommendations  Hoyer lift;Hospital bed    Recommendations  for Other Services       Precautions / Restrictions Precautions Precautions: Fall;Other (comment) Recall of Precautions/Restrictions: Impaired Precaution/Restrictions Comments: Fragile skin, LE lymphedema, chronic O2 [place] at home, fear of falls Restrictions Weight Bearing Restrictions Per Provider Order: No     Mobility  Bed Mobility Overal bed mobility: Needs Assistance Bed Mobility: Supine to Sit     Supine to sit: Mod assist, +2 for safety/equipment, HOB elevated, Used rails     General bed mobility comments: toward L EOB with dense multimodal cues for BLE initiation to rotate toward L EOB, tactile/verbal cues for cross-body reaching with LUE to opposite rail with pt able to assist with all extremities. Increased time/effort to perform, needs cues for contralateral hip scooting to foot flat with bed pad assist once sitting EOB.    Transfers Overall transfer level: Needs assistance Equipment used: 2 person hand held assist Transfers: Sit to/from Stand, Bed to chair/wheelchair/BSC Sit to Stand: Mod assist, +2 physical assistance   Step pivot transfers: Max assist, Mod assist, +2 physical assistance       General transfer comment: from EOB<>HHA x2 trials, pt took a couple lateral steps toward her L side prior to requesting seated break. On second attempt, able to take a few more steps toward chair on her L side, but needing up to maxA to weight shift and tactile/manual assist to improve LLE foot placement/weight shifting to step effectively toward chair and guide hips safely to middle of seat. No buckling, pt verbalizes fear of falls.    Ambulation/Gait                   Optometrist  Tilt Bed    Modified Rankin (Stroke Patients Only)       Balance Overall balance assessment: Needs assistance Sitting-balance support: Bilateral upper extremity supported, Feet supported Sitting balance-Leahy Scale: Fair Sitting balance -  Comments: static sitting with 1-2 UE support, pt preferring RUE on rail at end of bed but also able to place hands on mattress at hips to maintain; poor unsupported balance with challenge   Standing balance support: Bilateral upper extremity supported, During functional activity Standing balance-Leahy Scale: Poor Standing balance comment: +2 assist for static/dynamic tasks, esp with weight shifting                            Communication Communication Communication: No apparent difficulties  Cognition Arousal: Alert Behavior During Therapy: Anxious   PT - Cognitive impairments: Awareness, Memory, Problem solving, Safety/Judgement, Orientation, Initiation, Sequencing   Orientation impairments: Time, Situation                   PT - Cognition Comments: Pt initially calm and smiling, but pt slightly more anxious today with prospect of OOB mobility, verbalizes fear of falls but with encouragement and extra staff arriving and willing to stand by for safety, pt able to be convinced to participate in standing/weight shifting. Pt not able to recall bed-level exercises with this PTA previous date so handout brought to room to reinforce for her to work on with assist from spouse, he is also receptive. Frequent redirection to task. Following commands: Impaired Following commands impaired: Follows one step commands with increased time, Only follows one step commands consistently    Cueing Cueing Techniques: Verbal cues, Tactile cues, Gestural cues  Exercises General Exercises - Upper Extremity Shoulder Flexion:  (LUE per OT note) General Exercises - Lower Extremity Ankle Circles/Pumps: AROM, Both, 10 reps, Supine Hip ABduction/ADduction: AROM, AAROM, Both, 5 reps, Supine (cues for knees together to promote better LLE hip alignment to neutral, pt tends to rest with LLE in ER and knee in flexion)    General Comments General comments (skin integrity, edema, etc.): pt bed  pads/linens damp and have odor from urine, pt agreeable to gown change sitting EOB and linens removed from bed while pt sitting up in chair so RN can change her bed before return. SpO2 desat to 84% on 5L O2 [place] with standing activity, does not improve within 1 minute with seated rest break so O2 [place] increased to 6L/min, RN notified. Palliative team arriving and encouraging her during transfer to chair. Chair alarm on and activated for pt safety, spouse also remains in room.      Pertinent Vitals/Pain Pain Assessment Pain Assessment: Faces Faces Pain Scale: Hurts a little bit Pain Location: LUE c/o pain per spouse, no specific c/o per pt, intermittent grimacing and pt c/o itchy back Pain Descriptors / Indicators: Discomfort, Aching, Guarding Pain Intervention(s): Limited activity within patient's tolerance, Monitored during session, Repositioned    Home Living                          Prior Function            PT Goals (current goals can now be found in the care plan section) Acute Rehab PT Goals Patient Stated Goal: Per spouse for her to get stronger so she can be safe to go home with +1 assist. PT Goal Formulation: With patient Time For Goal Achievement: 11/14/23 Progress towards PT  goals: Progressing toward goals    Frequency    Min 2X/week      PT Plan      Co-evaluation PT/OT/SLP Co-Evaluation/Treatment: Yes Reason for Co-Treatment: Complexity of the patient's impairments (multi-system involvement);For patient/therapist safety;To address functional/ADL transfers PT goals addressed during session: Mobility/safety with mobility;Balance;Strengthening/ROM        AM-PAC PT 6 Clicks Mobility   Outcome Measure  Help needed turning from your back to your side while in a flat bed without using bedrails?: A Lot Help needed moving from lying on your back to sitting on the side of a flat bed without using bedrails?: A Lot Help needed moving to and from a bed to a  chair (including a wheelchair)?: Total Help needed standing up from a chair using your arms (e.g., wheelchair or bedside chair)?: A Lot Help needed to walk in hospital room?: Total Help needed climbing 3-5 steps with a railing? : Total 6 Click Score: 9    End of Session Equipment Utilized During Treatment: Oxygen ;Gait belt (5-6L /min) Activity Tolerance: Patient tolerated treatment well Patient left: with call bell/phone within reach;with family/visitor present;Other (comment);in chair;with chair alarm set (palliative team arriving; pillow also under her hips for comfort and behind her back) Nurse Communication: Mobility status;Other (comment) (safe technique for return with HHA, lift arm rest up on side where bed is for pt safety, can use bed pad under hips on each side for lift assist/safety while pivoting back to bed) PT Visit Diagnosis: Muscle weakness (generalized) (M62.81);Difficulty in walking, not elsewhere classified (R26.2)     Time: 8992-8964 PT Time Calculation (min) (ACUTE ONLY): 28 min  Charges:    $Therapeutic Activity: 8-22 mins PT General Charges $$ ACUTE PT VISIT: 1 Visit                     Christino Mcglinchey P., PTA Acute Rehabilitation Services Secure Chat Preferred 9a-5:30pm Office: [phone]    [facility] 11/12/2023, 1:06 PM

## 2023-11-12 NOTE — TOC Transition Note (Signed)
 Transition of Care [facility]) - Discharge Note   Patient Details  Name: Morgan Odonnell MRN: [id number] Date of Birth: 1950-07-31  [facility] CM/SW Contact:  Isaiah Public, LCSWA Phone Number: 11/12/2023, 1:21 PM   Clinical Narrative:     Patient will DC to: [facility]  Anticipated DC date: 11/12/2023  Family notified: [name] and [name]  Transport by: [facility]  ?  Per MD patient ready for DC to [facility] with palliative services to follow . RN, patient, patient's family, Amy with [facility],and facility notified of DC. Patients spouse [name] confirmed he will bring Bipap from home to facility.Discharge Summary sent to facility. RN given number for report [phone] RM# [id number] DC packet on chart. DNR signed by MD attached to patients DC packet. Ambulance transport requested for patient.  CSW signing off.   Final next level of care: Skilled Nursing Facility Barriers to Discharge: No Barriers Identified   Patient Goals and CMS Choice Patient states their goals for this hospitalization and ongoing recovery are:: SNF   Choice offered to / list presented to : Adult Children, Spouse (son and patients spouse)      Discharge Placement              Patient [facility] bed at: [facility] Patient to be transferred to facility by: [facility] Name of family member notified: [name] and [name] Patient and family notified of of transfer: 11/12/23  Discharge Plan and Services Additional resources added to the After Visit Summary for   In-house Referral: Clinical Social Work                                   Social Drivers of Health (SDOH) Interventions SDOH Screenings   Food Insecurity: No Food Insecurity (10/31/2023)  Housing: Low Risk  (10/31/2023)  Transportation Needs: Unmet Transportation Needs (10/31/2023)  Utilities: Not At Risk (10/31/2023)  Depression (PHQ2-9): Medium Risk (01/13/2019)  Social Connections: Moderately Isolated  (10/31/2023)  Tobacco Use: Medium Risk (10/31/2023)     Readmission Risk Interventions    09/19/2023    1:52 PM 08/17/2023    9:31 AM  Readmission Risk Prevention Plan  Transportation Screening Complete Complete  PCP or Specialist Appt within 5-7 Days  Complete  PCP or Specialist Appt within 3-5 Days Complete   Home Care Screening  Complete  Medication Review (RN CM)  Complete  HRI or Home Care Consult Complete   Social Work Consult for Recovery Care Planning/Counseling Complete   Palliative Care Screening Complete   Medication Review Oceanographer) Complete

## 2023-11-12 NOTE — Discharge Summary (Signed)
 Physician Discharge Summary   Patient: Morgan Odonnell MRN: [id number] DOB: 01-01-1951  Admit date:     10/30/2023  Discharge date: 11/12/23  Discharge Physician: Elgie Butter   PCP: Pcp, No   Recommendations at discharge:  Please follow up with PCP in one week.  Recommend outpatient follow up with palliative care.   Discharge Diagnoses: Principal Problem:   CAP (community acquired pneumonia) Active Problems:   Acute metabolic encephalopathy   Pressure injury of skin  Resolved Problems:   * No resolved hospital problems. *  Hospital Course: 73 years old female with history of morbid obesity, sleep apnea, obesity hypoventilation, chronic hypoxemic and hypercarbic respiratory failure on 4 L oxygen  at home, BiPAP at night but does not use it, chronic diastolic heart failure, COPD, hypothyroidism, CKD3a, bedbound status brought to the ED with worsening confusion. Noncompliance to BiPAP at home. Discharged with similar episode on 8/15 but family unable to care for patient at home given worsening confusion in the setting of noncompliance. Patient was readmitted   Assessment and Plan:   Acute metabolic encephalopathy secondary to acute on chronic hypoxic and hypercarbic respiratory failure complicated by severe sleep apnea and obesity hypoventilation Patient is alert and answering questions appropriately.  Patient is noncompliant. Patient required BIPAP and her settings are Fio2 is 5lit and pt is on auto titration , min of 6 and max of 12.  Patient is medically cleared and waiting for SNF placement       Acute on chronic diastolic heart failure She appears euvolemic       Suspected pneumonia Complete the course of antibiotics     Acute on stage IIIa CKD Creatinine continues to improve and back to baseline.    Hypokalemia: Potassium replaced.  Continue to monitor.   COPD without acute exacerbation:  Continue nebulizer/inhaler therapy .   Hypothyroidism: Continue  Synthroid .   Bilateral leg lymphedema: Chronic. Continue supportive care.   Goals of care: Discussed with patient and husband at bedside at length  given recurrent hospitalizations,  worsening mental status and noncompliance,  patient remains a remarkably high risk for decompensation. Palliative care is consulted. Patient not wanting hospice       Consultants: palliative  Procedures performed: none.   Disposition: Skilled nursing facility Diet recommendation:  Regular diet DISCHARGE MEDICATION: Allergies as of 11/12/2023       Reactions   Gabapentin Other (See Comments)   slept for 2 days   Naloxone  Other (See Comments)   Allergy not listed on MAR    Novocain [procaine] Other (See Comments)   intolerance        Medication List     TAKE these medications    AeroChamber Z-Stat Plus Chambr Misc Use as directed   albuterol  108 (90 Base) MCG/ACT inhaler Commonly known as: VENTOLIN  HFA Inhale 2 puffs into the lungs every 6 (six) hours as needed for wheezing.   aspirin  EC 81 MG tablet Take 1 tablet (81 mg total) by mouth daily.   baclofen  10 MG tablet Commonly known as: LIORESAL  Take 10 mg by mouth 3 (three) times daily.   budesonide -formoterol  160-4.5 MCG/ACT inhaler Commonly known as: Symbicort  INHALE TWO puffs into lung TWICE DAILY   cholecalciferol  25 MCG (1000 UNIT) tablet Commonly known as: VITAMIN D3 Take 5,000 Units by mouth daily.   citalopram  20 MG tablet Commonly known as: CELEXA  Take 1 tablet (20 mg total) by mouth daily.   diclofenac  sodium 1 % Gel Commonly known as: VOLTAREN  Apply 1 application topically  4 (four) times daily. Applied to knees, shoulders, wrists, and ankles   feeding supplement Liqd Take 237 mLs by mouth 2 (two) times daily between meals.   furosemide  40 MG tablet Commonly known as: LASIX  Take 1 tablet (40 mg total) by mouth 2 (two) times daily.   ipratropium-albuterol  0.5-2.5 (3) MG/3ML Soln Commonly known as:  DUONEB Take 3 mLs by nebulization every 6 (six) hours as needed (wheezing with dyspnea).   levothyroxine  200 MCG tablet Commonly known as: SYNTHROID  Take 1 tablet (200 mcg total) by mouth daily before breakfast.   Movantik  25 MG Tabs tablet Generic drug: naloxegol  oxalate Take 25 mg by mouth daily.   polyethylene glycol 17 g packet Commonly known as: MIRALAX  / GLYCOLAX  Take 17 g by mouth daily as needed for mild constipation.   potassium chloride  20 MEQ packet Commonly known as: KLOR-CON  Take 20 mEq by mouth 2 (two) times daily.   propranolol  10 MG tablet Commonly known as: INDERAL  Take 10 mg by mouth 2 (two) times daily as needed.   Relistor  150 MG Tabs Generic drug: Methylnaltrexone  Bromide Take 3 tablets by mouth daily.   spironolactone  25 MG tablet Commonly known as: ALDACTONE  Take 1 tablet (25 mg total) by mouth daily.   tiotropium 18 MCG inhalation capsule Commonly known as: Spiriva  HandiHaler Place 1 capsule (18 mcg total) into inhaler and inhale daily.               Discharge Care Instructions  (From admission, onward)           Start     Ordered   11/12/23 0000  Discharge wound care:       Comments: Comments: Cleanse arm wounds and right thigh wound with saline, pat dry Cover each area with single layer of xeroform gauze, top with foam Change daily   Apply Gerhardt's to the posterior thigh wounds daily   11/12/23 1308            Contact information for after-discharge care     Destination     [facility]   Service: Skilled Nursing Contact information: [address] [place]  [address] [phone]                    Discharge Exam: Morgan Odonnell   10/31/23 0107 11/10/23 0406  Weight: 114.4 kg 120.6 kg   General exam: Appears calm and comfortable  Respiratory system: Clear to auscultation. Respiratory effort normal. Cardiovascular system: S1 & S2 heard,  RRR. Gastrointestinal system: Abdomen is nondistended, soft and nontender.  Central nervous system: Alert and oriented. Extremities: Symmetric 5 x 5 power. Skin: No rashes,  Psychiatry:  Mood & affect appropriate.    Condition at discharge: fair  The results of significant diagnostics from this hospitalization (including imaging, microbiology, ancillary and laboratory) are listed below for reference.   Imaging Studies: CT Head Wo Contrast Result Date: 10/30/2023 CLINICAL DATA:  Altered mental status EXAM: CT HEAD WITHOUT CONTRAST TECHNIQUE: Contiguous axial images were obtained from the base of the skull through the vertex without intravenous contrast. RADIATION DOSE REDUCTION: This exam was performed according to the departmental dose-optimization program which includes automated exposure control, adjustment of the mA and/or kV according to patient size and/or use of iterative reconstruction technique. COMPARISON:  None Available. FINDINGS: Brain: There is atrophy and chronic small vessel disease changes. No acute intracranial abnormality. Specifically, no hemorrhage, hydrocephalus, mass lesion, acute infarction, or significant intracranial injury. Vascular: No hyperdense vessel or  unexpected calcification. Skull: No acute calvarial abnormality. Sinuses/Orbits: No acute findings Other: None IMPRESSION: Atrophy, chronic microvascular disease. No acute intracranial abnormality. Electronically Signed   By: Franky Crease M.D.   On: 10/30/2023 22:32   DG Chest Portable 1 View Result Date: 10/30/2023 CLINICAL DATA:  Reported hypoxia and altered mental status EXAM: PORTABLE CHEST 1 VIEW COMPARISON:  10/16/2023 FINDINGS: Stable cardiomegaly. Aortic atherosclerotic calcification. Pulmonary vascular congestion. Diffuse interstitial coarsening. Bi basilar airspace opacities similar to prior. No definite pleural effusion. No pneumothorax. IMPRESSION: Findings favor CHF with pulmonary edema. Multifocal pneumonia  could appear similarly. Electronically Signed   By: Norman Gatlin M.D.   On: 10/30/2023 21:20   CT Head Wo Contrast Result Date: 10/16/2023 CLINICAL DATA:  Mental status change EXAM: CT HEAD WITHOUT CONTRAST TECHNIQUE: Contiguous axial images were obtained from the base of the skull through the vertex without intravenous contrast. RADIATION DOSE REDUCTION: This exam was performed according to the departmental dose-optimization program which includes automated exposure control, adjustment of the mA and/or kV according to patient size and/or use of iterative reconstruction technique. COMPARISON:  06/26/2017 FINDINGS: Brain: No acute territorial infarction, hemorrhage or intracranial mass. Interval mild atrophy. Mild chronic small vessel ischemic changes of the white matter. Interval ventricular enlargement, likely due to atrophy progression Vascular: No hyperdense vessels.  No unexpected calcification Skull: Normal. Negative for fracture or focal lesion. Sinuses/Orbits: No acute finding. Other: None IMPRESSION: 1. No CT evidence for acute intracranial abnormality. 2. Interval mild atrophy and chronic small vessel ischemic changes of the white matter. Interval mild ventricular enlargement, likely due to atrophy progression. Electronically Signed   By: Luke Bun M.D.   On: 10/16/2023 21:41   DG Chest Portable 1 View Result Date: 10/16/2023 CLINICAL DATA:  Shortness of breath EXAM: PORTABLE CHEST 1 VIEW COMPARISON:  Radiograph and CT 09/19/2023 FINDINGS: Similar cardiomegaly. Aortic atherosclerotic calcification. Pulmonary vascular congestion. Interstitial coarsening and hazy airspace opacities in the lower lungs. Question small left pleural effusion. No pneumothorax. IMPRESSION: 1. Findings most compatible with CHF right and pulmonary edema. Electronically Signed   By: Norman Gatlin M.D.   On: 10/16/2023 18:53    Microbiology: Results for orders placed or performed during the hospital encounter of  10/30/23  Blood culture (routine x 2)     Status: None   Collection Time: 10/30/23  8:59 PM   Specimen: BLOOD  Result Value Ref Range Status   Specimen Description BLOOD RIGHT ANTECUBITAL  Final   Special Requests   Final    BOTTLES DRAWN AEROBIC AND ANAEROBIC Blood Culture results may not be optimal due to an inadequate volume of blood received in culture bottles   Culture   Final    NO GROWTH 5 DAYS Performed at [facility], [address], [place], [place] [address]    Report Status 11/04/2023 FINAL  Final  Blood culture (routine x 2)     Status: None   Collection Time: 10/30/23  8:59 PM   Specimen: BLOOD  Result Value Ref Range Status   Specimen Description BLOOD LEFT ANTECUBITAL  Final   Special Requests   Final    BOTTLES DRAWN AEROBIC AND ANAEROBIC Blood Culture results may not be optimal due to an inadequate volume of blood received in culture bottles   Culture   Final    NO GROWTH 5 DAYS Performed at [facility], [address], [place], [place] [address]    Report Status 11/04/2023 FINAL  Final  Urine  Culture     Status: Abnormal   Collection Time: 10/30/23 11:40 PM   Specimen: Urine, Catheterized  Result Value Ref Range Status   Specimen Description URINE, CATHETERIZED  Final   Special Requests   Final    NONE Performed at [facility], [address] [place], [place] [address]    Culture >=100,000 COLONIES/mL STAPHYLOCOCCUS EPIDERMIDIS (A)  Final   Report Status 11/01/2023 FINAL  Final   Organism ID, Bacteria STAPHYLOCOCCUS EPIDERMIDIS (A)  Final      Susceptibility   Staphylococcus epidermidis - MIC*    CIPROFLOXACIN >=8 RESISTANT Resistant     GENTAMICIN <=0.5 SENSITIVE Sensitive     NITROFURANTOIN <=16 SENSITIVE Sensitive     OXACILLIN >=4 RESISTANT Resistant     TETRACYCLINE 2 SENSITIVE Sensitive     VANCOMYCIN  <=0.5 SENSITIVE Sensitive     TRIMETH/SULFA 160 RESISTANT Resistant     RIFAMPIN <=0.5 SENSITIVE Sensitive     Inducible  Clindamycin NEGATIVE Sensitive     * >=100,000 COLONIES/mL STAPHYLOCOCCUS EPIDERMIDIS  Gastrointestinal Panel by PCR , Stool     Status: None   Collection Time: 11/01/23  7:39 AM   Specimen: Stool  Result Value Ref Range Status   Campylobacter species NOT DETECTED NOT DETECTED Final   Plesimonas shigelloides NOT DETECTED NOT DETECTED Final   Salmonella species NOT DETECTED NOT DETECTED Final   Yersinia enterocolitica NOT DETECTED NOT DETECTED Final   Vibrio species NOT DETECTED NOT DETECTED Final   Vibrio cholerae NOT DETECTED NOT DETECTED Final   Enteroaggregative E coli (EAEC) NOT DETECTED NOT DETECTED Final   Enteropathogenic E coli (EPEC) NOT DETECTED NOT DETECTED Final   Enterotoxigenic E coli (ETEC) NOT DETECTED NOT DETECTED Final   Shiga like toxin producing E coli (STEC) NOT DETECTED NOT DETECTED Final   Shigella/Enteroinvasive E coli (EIEC) NOT DETECTED NOT DETECTED Final   Cryptosporidium NOT DETECTED NOT DETECTED Final   Cyclospora cayetanensis NOT DETECTED NOT DETECTED Final   Entamoeba histolytica NOT DETECTED NOT DETECTED Final   Giardia lamblia NOT DETECTED NOT DETECTED Final   Adenovirus F40/41 NOT DETECTED NOT DETECTED Final   Astrovirus NOT DETECTED NOT DETECTED Final   Norovirus GI/GII NOT DETECTED NOT DETECTED Final   Rotavirus A NOT DETECTED NOT DETECTED Final   Sapovirus (I, II, IV, and V) NOT DETECTED NOT DETECTED Final    Comment: Performed at [facility] [address] [place], [place] [address]    Labs: CBC: Recent Labs  Lab 11/06/23 0518 11/08/23 0453 11/10/23 0306  WBC 9.5 10.6* 9.0  HGB 9.6* 9.6* 9.3*  HCT 30.4* 31.1* 30.2*  MCV 86.9 88.9 89.9  PLT 168 181 184   Basic Metabolic Panel: Recent Labs  Lab 11/06/23 0518 11/08/23 0453 11/10/23 0306  NA 137 138 137  K 3.5 3.5 3.9  CL 105 104 102  CO2 24 27 27   GLUCOSE 93 89 86  BUN 12 15 17   CREATININE 1.22* 1.30* 1.26*  CALCIUM  9.4 9.8 9.7   Liver Function Tests: No results  for input(s): AST, ALT, ALKPHOS, BILITOT, PROT, ALBUMIN in the last 168 hours. CBG: No results for input(s): GLUCAP in the last 168 hours.  Discharge time spent: 39 minutes  Signed: Elgie Butter, MD [facility] 11/12/2023

## 2023-11-12 NOTE — Progress Notes (Signed)
 Occupational Therapy Treatment Patient Details Name: Morgan Odonnell MRN: [id number] DOB: 12/12/1950 Today's Date: 11/12/2023   History of present illness 73 y.o female admitted 10/30/23 with AMS, CAP. 8/22 hypokalemia. PMH: admission 7/11-7/18 with D/C to SNF, return home 8/3, readmit 8/7-8/15 with AMS. CHF, OSA, COPD, hypothyroidism, obesity, chronic respiratory failure on 4L, depression,chronic back pain, HTN.   OT comments  Pt has a fear of falling and needed encouragement to transfer to chair. Pt benefits from a third person present holding the chair as a comfort to help anxiety. Pt requires pad at hips to help advance LLE to step toward chair. Recommendation for skilled inpatient follow up therapy, <3 hours/day.  Call me [name]      If plan is discharge home, recommend the following:  Two people to help with walking and/or transfers;A lot of help with bathing/dressing/bathroom;Assist for transportation;Help with stairs or ramp for entrance;Assistance with cooking/housework   Equipment Recommendations  Hospital bed;Hoyer lift;Wheelchair cushion (measurements OT);Wheelchair (measurements OT)    Recommendations for Other Services      Precautions / Restrictions Precautions Precautions: Fall;Other (comment) Recall of Precautions/Restrictions: Impaired Precaution/Restrictions Comments: Fragile skin, LE lymphedema, chronic O2 [place] at home, fear of falls Restrictions Weight Bearing Restrictions Per Provider Order: No       Mobility Bed Mobility Overal bed mobility: Needs Assistance Bed Mobility: Supine to Sit     Supine to sit: Mod assist, +2 for safety/equipment, HOB elevated, Used rails     General bed mobility comments: toward L EOB with dense multimodal cues for BLE initiation to rotate toward L EOB, tactile/verbal cues for cross-body reaching with LUE to opposite rail with pt able to assist with all extremities. Increased time/effort to perform, needs cues for contralateral  hip scooting to foot flat with bed pad assist once sitting EOB.    Transfers Overall transfer level: Needs assistance Equipment used: 2 person hand held assist Transfers: Sit to/from Stand, Bed to chair/wheelchair/BSC Sit to Stand: Mod assist, +2 physical assistance     Step pivot transfers: Max assist, Mod assist, +2 physical assistance     General transfer comment: from EOB<>HHA x2 trials, pt took a couple lateral steps toward her L side prior to requesting seated break. On second attempt, able to take a few more steps toward chair on her L side, but needing up to maxA to weight shift and tactile/manual assist to improve LLE foot placement/weight shifting to step effectively toward chair and guide hips safely to middle of seat. No buckling, pt verbalizes fear of falls.     Balance Overall balance assessment: Needs assistance Sitting-balance support: Bilateral upper extremity supported, Feet supported Sitting balance-Leahy Scale: Fair Sitting balance - Comments: static sitting with 1-2 UE support, pt preferring RUE on rail at end of bed but also able to place hands on mattress at hips to maintain; poor unsupported balance with challenge   Standing balance support: Bilateral upper extremity supported, During functional activity Standing balance-Leahy Scale: Poor Standing balance comment: +2 assist for static/dynamic tasks, esp with weight shifting                           ADL either performed or assessed with clinical judgement   ADL Overall ADL's : Needs assistance/impaired  Extremity/Trunk Assessment Upper Extremity Assessment Upper Extremity Assessment: Generalized weakness RUE Deficits / Details: tremors much improved from prior session LUE Deficits / Details: tremors much improved from prior session   Lower Extremity Assessment Lower Extremity Assessment: Defer to PT evaluation        Vision        Perception     Praxis     Communication Communication Communication: No apparent difficulties   Cognition Arousal: Alert Behavior During Therapy: Anxious Cognition: Cognition impaired             OT - Cognition Comments: pt requires 1 step commands and encouragement                 Following commands: Impaired Following commands impaired: Follows one step commands with increased time, Only follows one step commands consistently      Cueing   Cueing Techniques: Verbal cues, Tactile cues, Gestural cues  Exercises Exercises: Other exercises General Exercises - Upper Extremity Shoulder Flexion: AAROM, Right, Seated, Strengthening (LUE per OT note) General Exercises - Lower Extremity Hip ABduction/ADduction:  (cues for knees together to promote better LLE hip alignment to neutral, pt tends to rest with LLE in ER and knee in flexion)    Shoulder Instructions       General Comments pt bed pads/linens damp and have odor from urine, pt agreeable to gown change sitting EOB and linens removed from bed while pt sitting up in chair so RN can change her bed before return. SpO2 desat to 84% on 5L O2 [place] with standing activity, does not improve within 1 minute with seated rest break so O2 [place] increased to 6L/min, RN notified. Palliative team arriving and encouraging her during transfer to chair. Chair alarm on and activated for pt safety, spouse also remains in room.    Pertinent Vitals/ Pain       Pain Assessment Pain Assessment: Faces Faces Pain Scale: Hurts a little bit Pain Location: LUE c/o pain per spouse, no specific c/o per pt, intermittent grimacing and pt c/o itchy back Pain Descriptors / Indicators: Discomfort, Aching, Guarding Pain Intervention(s): Monitored during session, Limited activity within patient's tolerance, Repositioned  Home Living                                          Prior Functioning/Environment              Frequency            Progress Toward Goals  OT Goals(current goals can now be found in the care plan section)  Progress towards OT goals: Progressing toward goals  Acute Rehab OT Goals Patient Stated Goal: to be able to return home with spouse OT Goal Formulation: With family Time For Goal Achievement: 11/14/23 Potential to Achieve Goals: Good ADL Goals Pt Will Perform Eating: with set-up;bed level;with max assist Pt Will Perform Grooming: with max assist;bed level Pt Will Perform Upper Body Bathing: with set-up;bed level Pt Will Perform Lower Body Bathing: with max assist;bed level Additional ADL Goal #1: pt will complete basic sit to stand transfer mod (A) Additional ADL Goal #2: pt will complete basic stand pivot transfer mod (A) due to one person caregiver support  Plan      Co-evaluation    PT/OT/SLP Co-Evaluation/Treatment: Yes Reason for Co-Treatment: Complexity of the patient's impairments (multi-system involvement);For patient/therapist safety;To address functional/ADL transfers PT goals  addressed during session: Mobility/safety with mobility;Balance;Strengthening/ROM OT goals addressed during session: ADL's and self-care      AM-PAC OT 6 Clicks Daily Activity     Outcome Measure   Help from another person eating meals?: A Little Help from another person taking care of personal grooming?: A Little Help from another person toileting, which includes using toliet, bedpan, or urinal?: Total Help from another person bathing (including washing, rinsing, drying)?: Total Help from another person to put on and taking off regular upper body clothing?: A Lot Help from another person to put on and taking off regular lower body clothing?: Total 6 Click Score: 11    End of Session Equipment Utilized During Treatment: Oxygen       Activity Tolerance Patient limited by fatigue   Patient Left with family/visitor present;in chair;with call bell/phone within reach;with chair alarm  set   Nurse Communication Mobility status;Precautions;Need for lift equipment        Time: 1007-1035 OT Time Calculation (min): 28 min  Charges: OT General Charges $OT Visit: 1 Visit OT Treatments $Self Care/Home Management : 8-22 mins   Brynn, OTR/L  Acute Rehabilitation Services Office: [phone] .   Ely Molt 11/12/2023, 2:50 PM

## 2023-11-13 NOTE — Progress Notes (Signed)
 Chart accessed due to MD requesting last documented bipap settings for pt.

## 2023-11-28 ENCOUNTER — Encounter (HOSPITAL_COMMUNITY): Payer: Self-pay | Admitting: Emergency Medicine

## 2023-11-28 ENCOUNTER — Emergency Department (HOSPITAL_COMMUNITY)

## 2023-11-28 ENCOUNTER — Other Ambulatory Visit: Payer: Self-pay

## 2023-11-28 ENCOUNTER — Inpatient Hospital Stay (HOSPITAL_COMMUNITY)
Admission: EM | Admit: 2023-11-28 | Discharge: 2023-12-02 | DRG: 291 | Disposition: A | Discharge status: Hospice | Attending: Internal Medicine | Admitting: Internal Medicine

## 2023-11-28 DIAGNOSIS — E876 Hypokalemia: Secondary | ICD-10-CM | POA: Diagnosis present

## 2023-11-28 DIAGNOSIS — Z23 Encounter for immunization: Secondary | ICD-10-CM | POA: Diagnosis present

## 2023-11-28 DIAGNOSIS — Z7951 Long term (current) use of inhaled steroids: Secondary | ICD-10-CM

## 2023-11-28 DIAGNOSIS — Z5329 Procedure and treatment not carried out because of patient's decision for other reasons: Secondary | ICD-10-CM | POA: Diagnosis present

## 2023-11-28 DIAGNOSIS — Z7401 Bed confinement status: Secondary | ICD-10-CM | POA: Diagnosis not present

## 2023-11-28 DIAGNOSIS — Z811 Family history of alcohol abuse and dependence: Secondary | ICD-10-CM

## 2023-11-28 DIAGNOSIS — Z6841 Body Mass Index (BMI) 40.0 and over, adult: Secondary | ICD-10-CM | POA: Diagnosis not present

## 2023-11-28 DIAGNOSIS — G4733 Obstructive sleep apnea (adult) (pediatric): Secondary | ICD-10-CM | POA: Diagnosis present

## 2023-11-28 DIAGNOSIS — J9621 Acute and chronic respiratory failure with hypoxia: Secondary | ICD-10-CM | POA: Diagnosis present

## 2023-11-28 DIAGNOSIS — Z9981 Dependence on supplemental oxygen: Secondary | ICD-10-CM | POA: Diagnosis not present

## 2023-11-28 DIAGNOSIS — J441 Chronic obstructive pulmonary disease with (acute) exacerbation: Secondary | ICD-10-CM | POA: Diagnosis present

## 2023-11-28 DIAGNOSIS — I130 Hypertensive heart and chronic kidney disease with heart failure and stage 1 through stage 4 chronic kidney disease, or unspecified chronic kidney disease: Secondary | ICD-10-CM | POA: Diagnosis present

## 2023-11-28 DIAGNOSIS — N179 Acute kidney failure, unspecified: Secondary | ICD-10-CM | POA: Diagnosis present

## 2023-11-28 DIAGNOSIS — E039 Hypothyroidism, unspecified: Secondary | ICD-10-CM | POA: Diagnosis present

## 2023-11-28 DIAGNOSIS — J9622 Acute and chronic respiratory failure with hypercapnia: Secondary | ICD-10-CM | POA: Diagnosis present

## 2023-11-28 DIAGNOSIS — Z515 Encounter for palliative care: Secondary | ICD-10-CM

## 2023-11-28 DIAGNOSIS — Z79890 Hormone replacement therapy (postmenopausal): Secondary | ICD-10-CM

## 2023-11-28 DIAGNOSIS — F39 Unspecified mood [affective] disorder: Secondary | ICD-10-CM | POA: Diagnosis present

## 2023-11-28 DIAGNOSIS — N1831 Chronic kidney disease, stage 3a: Secondary | ICD-10-CM | POA: Diagnosis present

## 2023-11-28 DIAGNOSIS — Z807 Family history of other malignant neoplasms of lymphoid, hematopoietic and related tissues: Secondary | ICD-10-CM

## 2023-11-28 DIAGNOSIS — Z87891 Personal history of nicotine dependence: Secondary | ICD-10-CM | POA: Diagnosis not present

## 2023-11-28 DIAGNOSIS — Z79899 Other long term (current) drug therapy: Secondary | ICD-10-CM

## 2023-11-28 DIAGNOSIS — I890 Lymphedema, not elsewhere classified: Secondary | ICD-10-CM | POA: Diagnosis present

## 2023-11-28 DIAGNOSIS — R001 Bradycardia, unspecified: Secondary | ICD-10-CM | POA: Diagnosis present

## 2023-11-28 DIAGNOSIS — R0902 Hypoxemia: Secondary | ICD-10-CM | POA: Diagnosis present

## 2023-11-28 DIAGNOSIS — I5033 Acute on chronic diastolic (congestive) heart failure: Secondary | ICD-10-CM | POA: Diagnosis present

## 2023-11-28 DIAGNOSIS — Z7189 Other specified counseling: Secondary | ICD-10-CM | POA: Diagnosis not present

## 2023-11-28 DIAGNOSIS — Z8249 Family history of ischemic heart disease and other diseases of the circulatory system: Secondary | ICD-10-CM | POA: Diagnosis not present

## 2023-11-28 DIAGNOSIS — Z7982 Long term (current) use of aspirin: Secondary | ICD-10-CM

## 2023-11-28 DIAGNOSIS — Z8041 Family history of malignant neoplasm of ovary: Secondary | ICD-10-CM

## 2023-11-28 DIAGNOSIS — Z90721 Acquired absence of ovaries, unilateral: Secondary | ICD-10-CM

## 2023-11-28 DIAGNOSIS — Z66 Do not resuscitate: Secondary | ICD-10-CM | POA: Diagnosis present

## 2023-11-28 DIAGNOSIS — E662 Morbid (severe) obesity with alveolar hypoventilation: Secondary | ICD-10-CM | POA: Diagnosis present

## 2023-11-28 DIAGNOSIS — Z818 Family history of other mental and behavioral disorders: Secondary | ICD-10-CM

## 2023-11-28 DIAGNOSIS — Z9049 Acquired absence of other specified parts of digestive tract: Secondary | ICD-10-CM

## 2023-11-28 DIAGNOSIS — Z825 Family history of asthma and other chronic lower respiratory diseases: Secondary | ICD-10-CM

## 2023-11-28 DIAGNOSIS — I5032 Chronic diastolic (congestive) heart failure: Secondary | ICD-10-CM

## 2023-11-28 LAB — COMPREHENSIVE METABOLIC PANEL WITH GFR
ALT: 17 U/L (ref 0–44)
AST: 28 U/L (ref 15–41)
Albumin: 2.3 g/dL — ABNORMAL LOW (ref 3.5–5.0)
Alkaline Phosphatase: 68 U/L (ref 38–126)
Anion gap: 9 (ref 5–15)
BUN: 32 mg/dL — ABNORMAL HIGH (ref 8–23)
CO2: 28 mmol/L (ref 22–32)
Calcium: 8.6 mg/dL — ABNORMAL LOW (ref 8.9–10.3)
Chloride: 97 mmol/L — ABNORMAL LOW (ref 98–111)
Creatinine, Ser: 2.25 mg/dL — ABNORMAL HIGH (ref 0.44–1.00)
GFR, Estimated: 23 mL/min — ABNORMAL LOW (ref >60)
Glucose, Bld: 81 mg/dL (ref 70–99)
Potassium: 3.4 mmol/L — ABNORMAL LOW (ref 3.5–5.1)
Sodium: 134 mmol/L — ABNORMAL LOW (ref 135–145)
Total Bilirubin: 1.6 mg/dL — ABNORMAL HIGH (ref 0.0–1.2)
Total Protein: 7.2 g/dL (ref 6.5–8.1)

## 2023-11-28 LAB — I-STAT VENOUS BLOOD GAS, ED
Acid-Base Excess: 5 mmol/L — ABNORMAL HIGH (ref 0.0–2.0)
Bicarbonate: 28.8 mmol/L — ABNORMAL HIGH (ref 20.0–28.0)
Calcium, Ion: 1.07 mmol/L — ABNORMAL LOW (ref 1.15–1.40)
HCT: 31 % — ABNORMAL LOW (ref 36.0–46.0)
Hemoglobin: 10.5 g/dL — ABNORMAL LOW (ref 12.0–15.0)
O2 Saturation: 87 %
Potassium: 3.5 mmol/L (ref 3.5–5.1)
Sodium: 136 mmol/L (ref 135–145)
TCO2: 30 mmol/L (ref 22–32)
pCO2, Ven: 36.6 mmHg — ABNORMAL LOW (ref 44–60)
pH, Ven: 7.505 — ABNORMAL HIGH (ref 7.25–7.43)
pO2, Ven: 48 mmHg — ABNORMAL HIGH (ref 32–45)

## 2023-11-28 LAB — CBC WITH DIFFERENTIAL/PLATELET
Abs Immature Granulocytes: 0.05 K/uL (ref 0.00–0.07)
Basophils Absolute: 0 K/uL (ref 0.0–0.1)
Basophils Relative: 0 %
Eosinophils Absolute: 0.2 K/uL (ref 0.0–0.5)
Eosinophils Relative: 2 %
HCT: 31 % — ABNORMAL LOW (ref 36.0–46.0)
Hemoglobin: 9.5 g/dL — ABNORMAL LOW (ref 12.0–15.0)
Immature Granulocytes: 1 %
Lymphocytes Relative: 26 %
Lymphs Abs: 2.3 K/uL (ref 0.7–4.0)
MCH: 26.5 pg (ref 26.0–34.0)
MCHC: 30.6 g/dL (ref 30.0–36.0)
MCV: 86.6 fL (ref 80.0–100.0)
Monocytes Absolute: 1.2 K/uL — ABNORMAL HIGH (ref 0.1–1.0)
Monocytes Relative: 13 %
Neutro Abs: 5.2 K/uL (ref 1.7–7.7)
Neutrophils Relative %: 58 %
Platelets: 247 K/uL (ref 150–400)
RBC: 3.58 MIL/uL — ABNORMAL LOW (ref 3.87–5.11)
RDW: 17.6 % — ABNORMAL HIGH (ref 11.5–15.5)
WBC: 9 K/uL (ref 4.0–10.5)
nRBC: 0 % (ref 0.0–0.2)

## 2023-11-28 LAB — BRAIN NATRIURETIC PEPTIDE: B Natriuretic Peptide: 2425.3 pg/mL — ABNORMAL HIGH (ref 0.0–100.0)

## 2023-11-28 LAB — MAGNESIUM: Magnesium: 1.8 mg/dL (ref 1.7–2.4)

## 2023-11-28 MED ORDER — FUROSEMIDE 40 MG PO TABS
40.0000 mg | ORAL_TABLET | Freq: Every day | ORAL | Status: DC
Start: 2023-11-29 — End: 2023-11-29

## 2023-11-28 MED ORDER — LACTATED RINGERS IV BOLUS
500.0000 mL | Freq: Once | INTRAVENOUS | Status: AC
  Administered 2023-11-28: 500 mL via INTRAVENOUS

## 2023-11-28 MED ORDER — ASPIRIN 81 MG PO TBEC
81.0000 mg | DELAYED_RELEASE_TABLET | Freq: Every morning | ORAL | Status: DC
  Administered 2023-11-29 – 2023-12-02 (×4): 81 mg via ORAL
  Filled 2023-11-28 (×4): qty 1

## 2023-11-28 MED ORDER — POTASSIUM CHLORIDE 20 MEQ PO PACK
20.0000 meq | PACK | Freq: Two times a day (BID) | ORAL | Status: DC
  Administered 2023-11-28 – 2023-12-02 (×9): 20 meq via ORAL
  Filled 2023-11-28 (×9): qty 1

## 2023-11-28 MED ORDER — LEVOTHYROXINE SODIUM 100 MCG PO TABS
200.0000 ug | ORAL_TABLET | Freq: Every day | ORAL | Status: DC
  Administered 2023-11-29 – 2023-12-02 (×4): 200 ug via ORAL
  Filled 2023-11-28 (×4): qty 2

## 2023-11-28 MED ORDER — BUDESONIDE 0.5 MG/2ML IN SUSP
0.5000 mg | Freq: Two times a day (BID) | RESPIRATORY_TRACT | Status: DC
  Administered 2023-11-28 – 2023-12-02 (×9): 0.5 mg via RESPIRATORY_TRACT
  Filled 2023-11-28 (×9): qty 2

## 2023-11-28 MED ORDER — ONDANSETRON HCL 4 MG/2ML IJ SOLN: 4.0000 mg | Freq: Four times a day (QID) | INTRAMUSCULAR | Status: DC | PRN

## 2023-11-28 MED ORDER — METHYLPREDNISOLONE SODIUM SUCC 125 MG IJ SOLR
125.0000 mg | Freq: Once | INTRAMUSCULAR | Status: AC
  Administered 2023-11-28: 125 mg via INTRAVENOUS
  Filled 2023-11-28: qty 2

## 2023-11-28 MED ORDER — IPRATROPIUM-ALBUTEROL 0.5-2.5 (3) MG/3ML IN SOLN: 3.0000 mL | Freq: Four times a day (QID) | RESPIRATORY_TRACT | Status: DC | PRN

## 2023-11-28 MED ORDER — PROPRANOLOL HCL 10 MG PO TABS
10.0000 mg | ORAL_TABLET | Freq: Two times a day (BID) | ORAL | Status: DC
  Administered 2023-11-28 – 2023-12-02 (×7): 10 mg via ORAL
  Filled 2023-11-28 (×9): qty 1

## 2023-11-28 MED ORDER — CITALOPRAM HYDROBROMIDE 20 MG PO TABS
20.0000 mg | ORAL_TABLET | Freq: Every day | ORAL | Status: DC
  Administered 2023-11-29 – 2023-12-02 (×4): 20 mg via ORAL
  Filled 2023-11-28 (×4): qty 1

## 2023-11-28 MED ORDER — SENNOSIDES-DOCUSATE SODIUM 8.6-50 MG PO TABS: 1.0000 | ORAL_TABLET | Freq: Every evening | ORAL | Status: DC | PRN

## 2023-11-28 MED ORDER — IPRATROPIUM-ALBUTEROL 0.5-2.5 (3) MG/3ML IN SOLN: 3.0000 mL | RESPIRATORY_TRACT | Status: DC

## 2023-11-28 MED ORDER — IPRATROPIUM-ALBUTEROL 0.5-2.5 (3) MG/3ML IN SOLN
3.0000 mL | RESPIRATORY_TRACT | Status: DC
Start: 2023-11-28 — End: 2023-11-28
  Administered 2023-11-28 (×2): 3 mL via RESPIRATORY_TRACT
  Filled 2023-11-28: qty 9

## 2023-11-28 MED ORDER — ENSURE ENLIVE PO LIQD
237.0000 mL | Freq: Every day | ORAL | Status: DC
  Administered 2023-11-29 – 2023-11-30 (×2): 237 mL via ORAL

## 2023-11-28 MED ORDER — BISACODYL 5 MG PO TBEC: 5.0000 mg | DELAYED_RELEASE_TABLET | Freq: Every day | ORAL | Status: DC | PRN

## 2023-11-28 MED ORDER — ENOXAPARIN SODIUM 30 MG/0.3ML IJ SOSY
30.0000 mg | PREFILLED_SYRINGE | INTRAMUSCULAR | Status: DC
  Administered 2023-11-29 – 2023-12-02 (×4): 30 mg via SUBCUTANEOUS
  Filled 2023-11-28 (×4): qty 0.3

## 2023-11-28 MED ORDER — ONDANSETRON HCL 4 MG PO TABS: 4.0000 mg | ORAL_TABLET | Freq: Four times a day (QID) | ORAL | Status: DC | PRN

## 2023-11-28 MED ORDER — UMECLIDINIUM BROMIDE 62.5 MCG/ACT IN AEPB
1.0000 | INHALATION_SPRAY | Freq: Every day | RESPIRATORY_TRACT | Status: DC
  Administered 2023-11-29 – 2023-12-02 (×4): 1 via RESPIRATORY_TRACT
  Filled 2023-11-28: qty 7

## 2023-11-28 MED ORDER — ARFORMOTEROL TARTRATE 15 MCG/2ML IN NEBU
15.0000 ug | INHALATION_SOLUTION | Freq: Two times a day (BID) | RESPIRATORY_TRACT | Status: DC
  Administered 2023-11-28 – 2023-12-02 (×9): 15 ug via RESPIRATORY_TRACT
  Filled 2023-11-28 (×9): qty 2

## 2023-11-28 MED ORDER — DICLOFENAC SODIUM 1 % EX GEL
2.0000 g | Freq: Three times a day (TID) | CUTANEOUS | Status: DC
Start: 2023-11-29 — End: 2023-12-03
  Administered 2023-11-29 – 2023-12-02 (×13): 2 g via TOPICAL
  Filled 2023-11-28: qty 100

## 2023-11-28 MED ORDER — ACETAMINOPHEN 650 MG RE SUPP: 650.0000 mg | Freq: Four times a day (QID) | RECTAL | Status: DC | PRN

## 2023-11-28 MED ORDER — ACETAMINOPHEN 325 MG PO TABS
650.0000 mg | ORAL_TABLET | Freq: Four times a day (QID) | ORAL | Status: DC | PRN
  Administered 2023-11-29 – 2023-12-01 (×3): 650 mg via ORAL
  Filled 2023-11-28 (×3): qty 2

## 2023-11-28 MED ORDER — NALOXEGOL OXALATE 25 MG PO TABS
25.0000 mg | ORAL_TABLET | Freq: Every day | ORAL | Status: DC
  Administered 2023-11-29 – 2023-12-02 (×4): 25 mg via ORAL
  Filled 2023-11-28 (×4): qty 1

## 2023-11-28 NOTE — ED Provider Notes (Signed)
 [facility]MENT AT [facility] Provider Note HPI Morgan Odonnell is a 73 y.o. female with a PMH of morbid obesity, sleep apnea, obesity hypoventilation, chronic hypoxemic and hypercarbic respiratory failure on 4 L oxygen  at home, BiPAP at night but does not use it, chronic diastolic heart failure, COPD, hypothyroidism, CKD3a, bedbound status who presents to the emergency department for acute on chronic shortness of breath.  Patient was having worsening shortness of breath today.  She wears 4 L of oxygen  at home.  She was given several albuterol  treatments without improvement.  EMS found her hypoxic to the 80s and administered a DuoNeb treatment.  Denies chest pain or fevers.  She states that she has had a cough for 20 years but it is unchanged today.  Denies abdominal pain/vomiting  Past Medical History:  Diagnosis Date   Allergies    Anemia    Back pain    Bilateral swelling of feet    CHF (congestive heart failure) (HCC)    Chronic cough    COPD (chronic obstructive pulmonary disease) (HCC)    Depression    GERD (gastroesophageal reflux disease)    Heart murmur    Hypertension    Hypocapnia    Hypothyroid    Knee pain    Leg edema    chronic, bilateral   Lightheaded    Low back pain    Morbid obesity (HCC)    Neck pain    Obesity hypoventilation syndrome (HCC)    Oral lesion    Shortness of breath    Shortness of breath    Shoulder pain    Snoring    Past Surgical History:  Procedure Laterality Date   APPENDECTOMY     CHOLECYSTECTOMY     COLONOSCOPY N/A 06/25/2013   Procedure: COLONOSCOPY;  Surgeon: Belvie JONETTA Just, MD;  Location: [facility];  Service: Endoscopy;  Laterality: N/A;   ECTOPIC PREGNANCY SURGERY     Left Ovary Removed     MENISCUS REPAIR     TONSILLECTOMY     TUBAL LIGATION      Review of Systems Pertinent positives and negative findings are listed as part of the History of Present Illness and MDM  Physical Exam Vitals:    11/28/23 2011 11/28/23 2030 11/28/23 2046 11/28/23 2054  BP: (!) 105/52 123/67 (!) 106/49   Pulse: (!) 59 (!) 54 (!) 57 (!) 52  Resp: 17 12 15 17   Temp:      TempSrc:      SpO2: (!) 87% 100% 95% 99%  Weight:      Height:         Constitutional Nursing notes reviewed Vital signs reviewed  HEENT No obvious trauma Pupils round, equal, and reactive to light. Pupils cross midline Neck supple  Respiratory Bilateral expiratory wheezes with prolonged expiratory phase Diminished breath sounds bilaterally  CV Normal rate and rhythm   Abdomen Soft, non-tender, non-distended No peritonitis  MSK Atraumatic No obvious deformity ROM appropriate  Neuro Awake and alert Pupils cross midline Moving all extremities    MDM:  Initial Differential Diagnoses includes COPD exacerbation, arrhythmia, high-grade conduction block, ACS, pneumonia, pneumothorax  I reviewed the patient's vitals, the nursing triage note and evaluated the patient at bedside.  Patient has bilateral expiratory wheezing with prolonged expiratory phase and an increase in her oxygen  requirement.  She wears 4 L at baseline.  She was just discharged in the hospital on 11/12/2023 and sent to a SNF after an admission  for encephalopathy.  Patient has had no fevers or increased sputum production.  CBC shows no leukocytosis.  Chest x-ray shows no pneumonia or pneumothorax.  EKG shows normal sinus rhythm with no evidence of acute ischemic changes, high-grade conduction block or new onset arrhythmia.  Patient has had no chest pain.  Labs show an AKI with creatinine of 2.25.    On reevaluation after DuoNebs and steroids, patient still has expiratory wheezing.  She is on 6 L nasal cannula.  Will admit the patient for COPD exacerbation and AKI.  No indication for antibiotics at this time.  Patient is not on NIPPV, does not have any new productive cough, has no leukocytosis, and has no x-ray findings of  pneumonia  Procedures: Procedures  Medications administered in the ED: Medications  ipratropium-albuterol  (DUONEB) 0.5-2.5 (3) MG/3ML nebulizer solution 3 mL (3 mLs Nebulization Patient Refused/Not Given 11/28/23 2055)  lactated ringers  bolus 500 mL (has no administration in time range)  methylPREDNISolone  sodium succinate (SOLU-MEDROL ) 125 mg/2 mL injection 125 mg (125 mg Intravenous Given 11/28/23 2034)     Impression: 1. COPD exacerbation (HCC)   2. Hypoxia   3. AKI (acute kidney injury) [facility])      Patient's presentation is most consistent with acute presentation with potential threat to life or bodily function.  Disposition: ED Disposition:  Admit     ED Discharge Orders     None             Dionisio Blunt, MD 11/28/23 2209    Patsey Lot, MD 11/28/23 [phone]

## 2023-11-28 NOTE — ED Triage Notes (Signed)
 BIB [facility] from [facility] for low O2 after breathing tx. EMS reports pt is a little confused. Pt received albuterol  and duoneb in route. EMS reports diminished lung sounds. Pt does not have hx of cognitive issues but has been confussed with EMS- husband report mild baseline confusion.  BP 132/88 HR 56 Spo2 87 on 8 LPM 97 on Duoneb

## 2023-11-28 NOTE — H&P (Addendum)
 History and Physical  Morgan Odonnell FMW:[id number] DOB: 1951-03-10 DOA: 11/28/2023  PCP: Pcp, No   Chief Complaint: Shortness of breath  HPI: Morgan Odonnell is a 73 y.o. female with medical history significant for morbid obesity, OSA, mild cognitive impairment, obesity hypoventilation syndrome, chronic hypoxemic and hypercarbic respiratory failure on 4 L oxygen  at home, BiPAP at night but does not use it, lymphedema, chronic diastolic HF, COPD, hypothyroidism, CKD3A, bedbound status who presented to the ED for evaluation of shortness of breath. Per spouse, patient has been getting rehab at [facility] after her most recent hospitalization.  Patient was found to be hypoxic at the facility with reported shortness of breath.  She was given a breathing treatment her pulse ox was change however she remained hypoxic to the 80s on her baseline 4 L [place] so EMS was called.  Patient found to be wheezing on arrival to the ED. Spouse reports he is unsure if patient is receiving her Lasix  at the facility. At bedside, patient reports mild shortness of breath but denies any chest pain, abdominal pain, nausea, vomiting, fever, chills or dysuria. She does have a chronic cough that is unchanged.  ED Course: Initial vitals show temp 98, RR 12-19, HR 50-60s, SBP 98-110s, SpO2 100% on 8 L. Initial labs significant for sodium 134, K+ 3.4, BUNs/creatinine 32/2.25, WBC 9.0, Hgb 9.5. CXR shows cardiomegaly with vascular congestion. Pt received IV Solu-Medrol , DuoNebs and IV LR 500 cc bolus. TRH was consulted for admission.   Review of Systems: Please see HPI for pertinent positives and negatives. A complete 10 system review of systems are otherwise negative.  Past Medical History:  Diagnosis Date   Allergies    Anemia    Back pain    Bilateral swelling of feet    CHF (congestive heart failure) (HCC)    Chronic cough    COPD (chronic obstructive pulmonary disease) (HCC)    Depression    GERD (gastroesophageal  reflux disease)    Heart murmur    Hypertension    Hypocapnia    Hypothyroid    Knee pain    Leg edema    chronic, bilateral   Lightheaded    Low back pain    Morbid obesity (HCC)    Neck pain    Obesity hypoventilation syndrome (HCC)    Oral lesion    Shortness of breath    Shortness of breath    Shoulder pain    Snoring    Past Surgical History:  Procedure Laterality Date   APPENDECTOMY     CHOLECYSTECTOMY     COLONOSCOPY N/A 06/25/2013   Procedure: COLONOSCOPY;  Surgeon: Belvie JONETTA Just, MD;  Location: [facility];  Service: Endoscopy;  Laterality: N/A;   ECTOPIC PREGNANCY SURGERY     Left Ovary Removed     MENISCUS REPAIR     TONSILLECTOMY     TUBAL LIGATION     Social History:  reports that she quit smoking about 8 years ago. Her smoking use included cigarettes. She started smoking about 53 years ago. She has a 90 pack-year smoking history. She has never used smokeless tobacco. She reports that she does not drink alcohol and does not use drugs.  Allergies  Allergen Reactions   Gabapentin Other (See Comments)    slept for 2 days   Naloxone  Other (See Comments)    Allergy not listed on MAR    Novocain [Procaine] Other (See Comments)    intolerance    Family History  Problem Relation Age of Onset   Ovarian cancer Mother    Lung disease Mother    Heart failure Mother    Hypertension Mother    Heart disease Mother    Seizures Mother    Cancer Mother    Depression Mother    Sleep apnea Mother    Alcoholism Mother    Eating disorder Mother    Obesity Mother    Hypertension Father    Eating disorder Father    Obesity Father    Anxiety disorder Father    Lymphoma Brother    Ovarian cancer Maternal Aunt    Asthma Daughter      Prior to Admission medications   Medication Sig Start Date End Date Taking? Authorizing Provider  Aspirin  81 MG CAPS Take 81 mg by mouth in the morning.   Yes [provider]  baclofen  (LIORESAL ) 10 MG tablet Take 10 mg  by mouth 3 (three) times daily. 10/28/23  Yes [provider]  budesonide  (PULMICORT ) 0.5 MG/2ML nebulizer solution Take 0.5 mg by nebulization 2 (two) times daily.   Yes [provider]  citalopram  (CELEXA ) 20 MG tablet Take 1 tablet (20 mg total) by mouth daily. 07/04/17  Yes Rojelio Nest, DO  diclofenac  sodium (VOLTAREN ) 1 % GEL Apply 1 application topically 4 (four) times daily. Applied to knees, shoulders, wrists, and ankles   Yes [provider]  formoterol  (PERFOROMIST ) 20 MCG/2ML nebulizer solution Take 20 mcg by nebulization 2 (two) times daily.   Yes [provider]  furosemide  (LASIX ) 40 MG tablet Take 1 tablet (40 mg total) by mouth 2 (two) times daily. Patient taking differently: Take 40 mg by mouth daily. 10/24/23  Yes Fairy Frames, MD  levothyroxine  (SYNTHROID ) 100 MCG tablet Take 200 mcg by mouth daily before breakfast.   Yes [provider]  naloxegol  oxalate (MOVANTIK ) 25 MG TABS tablet Take 25 mg by mouth daily.   Yes [provider]  Nutritional Supplements (NUTRITIONAL SHAKE PLUS PROTEIN) LIQD Take 120 mLs by mouth daily at 6 PM.   Yes [provider]  OXYGEN  Inhale 5 L into the lungs continuous.   Yes [provider]  potassium chloride  (KLOR-CON ) 20 MEQ packet Take 20 mEq by mouth 2 (two) times daily. 10/24/23  Yes Fairy Frames, MD  propranolol  (INDERAL ) 10 MG tablet Take 10 mg by mouth 2 (two) times daily.   Yes [provider]  Spacer/Aero-Holding Chambers (AEROCHAMBER Z-STAT PLUS [place]) MISC Use as directed 05/10/15  Yes Noreen Tonnie BRAVO, MD  spironolactone  (ALDACTONE ) 25 MG tablet Take 1 tablet (25 mg total) by mouth daily. 10/24/23  Yes Joseph, Preetha, MD  tiotropium (SPIRIVA  HANDIHALER) 18 MCG inhalation capsule Place 1 capsule (18 mcg total) into inhaler and inhale daily. Patient taking differently: Place 1 capsule into inhaler and inhale every morning. 07/09/22  Yes Hunsucker, Donnice SAUNDERS, MD  albuterol  (PROVENTIL  HFA;VENTOLIN  HFA) 108 (90 BASE) MCG/ACT inhaler Inhale 2 puffs into the lungs every 6 (six) hours as needed for wheezing.     [provider]  ipratropium-albuterol  (DUONEB) 0.5-2.5 (3) MG/3ML SOLN Take 3 mLs by nebulization every 6 (six) hours as needed (wheezing with dyspnea). 09/26/23   Rizwan, Saima, MD  polyethylene glycol (MIRALAX  / GLYCOLAX ) 17 g packet Take 17 g by mouth daily as needed for mild constipation. 09/01/23   Jadine Toribio SQUIBB, MD    Physical Exam: BP 124/68 (BP Location: Left Arm)   Pulse (!) 58   Temp 98.2  F (36.8 C) (Oral)   Resp 14   Ht 5' 2 (1.575 m)   Wt 116.1 kg   SpO2 93%   BMI 46.81 kg/m  General: Pleasant, well-appearing morbidly obese elderly woman laying in bed. No acute distress. HEENT: [place]/AT. Anicteric sclera CV: RRR. No murmurs, rubs, or gallops. 1-2+ Nonpitting edema Pulmonary: On 6 L [place]. Lungs CTAB. Normal effort. No wheezing. Bibasilar rales. Decreased breath sounds throughout. Abdominal: Soft, nontender, nondistended. Normal bowel sounds. Extremities: Lymphedema. Palpable pedal pulses. Normal ROM. Skin: Warm and dry. No obvious rash or lesions. Neuro: A&Ox3. Moves all extremities. Normal sensation to light touch. No focal deficit. Psych: Normal mood and affect          Labs on Admission:  Basic Metabolic Panel: Recent Labs  Lab 11/28/23 1953 11/28/23 2239  NA 134* 136  K 3.4* 3.5  CL 97*  --   CO2 28  --   GLUCOSE 81  --   BUN 32*  --   CREATININE 2.25*  --   CALCIUM  8.6*  --   MG 1.8  --    Liver Function Tests: Recent Labs  Lab 11/28/23 1953  AST 28  ALT 17  ALKPHOS 68  BILITOT 1.6*  PROT 7.2  ALBUMIN 2.3*   No results for input(s): LIPASE, AMYLASE in the last 168 hours. No results for input(s): AMMONIA in the last 168 hours. CBC: Recent Labs  Lab 11/28/23 1953 11/28/23 2239  WBC 9.0  --   NEUTROABS 5.2  --   HGB 9.5* 10.5*  HCT 31.0* 31.0*  MCV 86.6  --   PLT 247   --    Cardiac Enzymes: No results for input(s): CKTOTAL, CKMB, CKMBINDEX, TROPONINI in the last 168 hours. BNP (last 3 results) Recent Labs    10/16/23 1741 10/30/23 2045 11/28/23 2231  BNP 923.4* 167.4* 2,425.3*    ProBNP (last 3 results) No results for input(s): PROBNP in the last 8760 hours.  CBG: No results for input(s): GLUCAP in the last 168 hours.  Radiological Exams on Admission: DG Chest Portable 1 View Result Date: 11/28/2023 CLINICAL DATA:  Show shortness of breath EXAM: PORTABLE CHEST 1 VIEW COMPARISON:  10/30/2023 FINDINGS: Cardiomegaly, vascular congestion. No confluent opacities, effusions or edema. No acute bony abnormality. Dense mitral valve calcifications. IMPRESSION: Cardiomegaly, vascular congestion. Electronically Signed   By: Franky Crease M.D.   On: 11/28/2023 20:28   My independent interpretation of EKG: Sinus bradycardia with biatrial enlargement, slightly prolonged PR interval borderline prolonged QTc  Assessment/Plan Morgan Odonnell is a 73 y.o. female with medical history significant for morbid obesity, OSA, obesity hypoventilation syndrome, chronic hypoxemic and hypercarbic respiratory failure on 4 L oxygen  at home, BiPAP at night but does not use it, lymphedema, chronic diastolic HF, COPD, hypothyroidism, CKD3A, bedbound status who presented to the ED for evaluation of shortness of breath and admitted for acute on chronic hypoxic and hypercarbic respiratory failure.  # Acute on chronic respiratory failure with hypoxia and hypercapnia - Presented with shortness of breath and hypoxia - Found to have increased O2 requirement from 4 L [place] to 8 L [place], currently weaned down to 6 L [place] - This is secondary to acute CHF exacerbation and COPD exacerbation - Continue supplemental O2, wean as able  # COPD exacerbation - Presented with shortness of breath, wheezing and hypoxia - Pt with rales and decreased breath sounds on lung auscultation - CXR with  no evidence of PNA; Neg covid, RSV and flu  test - S/p IV solumedrol, DuoNebs in the ED - Start prednisone  40 mg daily - Continue home bronchodilator - As needed duonebs - Incentive spirometer, flutter valve  # Acute on chronic diastolic HF - Last TTE on 08/16/2023 shows EF 50-55%, moderately reduced RV SF and moderately dilated LA and RA - Pt presented with shortness of breath and hypoxia - Pt with clinical, laboratory and radiological signs of CHF exacerbation - Acute CHF likely 2/2 under diuresing in the outpatient - Start IV lasix  40 mg twice daily - Strict I&O, daily weights - Maintain K+ > 4.0, Mag > 2.0 - Telemetry  # AKI on CKD 3A - Creatinine elevated to 2.25, from baseline of 1.2-1.3 - Likely secondary to acute CHF - IV diuresing as above - Trend renal function - Avoid nephrotoxic meds  # Hypokalemia - K+ 3.4 on admission - Continue home potassium supplementation - Follow-up morning K+ and mag  # Chronic lymphedema - Continue IV diuresing as above - Lymphedema clinic in the outpatient  # Hypothyroidism - Continue on Synthroid  - Follow-up TSH  # OSA # OHS - Continue BiPAP at night  # Mood disorder - Continue Celexa   # Morbid obesity Body mass index is 46.81 kg/m. Filed Weights   11/28/23 1938 11/28/23 2353  Weight: 120.6 kg 116.1 kg  - F/u with PCP for weight lost and nutrition counseling  DVT prophylaxis: Lovenox      Code Status: Limited: Do not attempt resuscitation (DNR) -DNR-LIMITED -Do Not Intubate/DNI   Consults called: None  Family Communication: Discussed admission with spouse at bedside  Severity of Illness: The appropriate patient status for this patient is INPATIENT. Inpatient status is judged to be reasonable and necessary in order to provide the required intensity of service to ensure the patient's safety. The patient's presenting symptoms, physical exam findings, and initial radiographic and laboratory data in the context of their  chronic comorbidities is felt to place them at high risk for further clinical deterioration. Furthermore, it is not anticipated that the patient will be medically stable for discharge from the hospital within 2 midnights of admission.   * I certify that at the point of admission it is my clinical judgment that the patient will require inpatient hospital care spanning beyond 2 midnights from the point of admission due to high intensity of service, high risk for further deterioration and high frequency of surveillance required.*  Level of care: Progressive    Lou Claretta HERO, MD 11/29/2023, 2:54 AM [facility] Pager: [phone] Isaiah 41:10   If 7PM-7AM, please contact night-coverage [email or web address] Password [email or web address]

## 2023-11-29 DIAGNOSIS — R0902 Hypoxemia: Secondary | ICD-10-CM

## 2023-11-29 DIAGNOSIS — J9621 Acute and chronic respiratory failure with hypoxia: Secondary | ICD-10-CM | POA: Diagnosis not present

## 2023-11-29 DIAGNOSIS — J9622 Acute and chronic respiratory failure with hypercapnia: Secondary | ICD-10-CM

## 2023-11-29 LAB — BASIC METABOLIC PANEL WITH GFR
Anion gap: 11 (ref 5–15)
BUN: 30 mg/dL — ABNORMAL HIGH (ref 8–23)
CO2: 27 mmol/L (ref 22–32)
Calcium: 8.9 mg/dL (ref 8.9–10.3)
Chloride: 96 mmol/L — ABNORMAL LOW (ref 98–111)
Creatinine, Ser: 2.12 mg/dL — ABNORMAL HIGH (ref 0.44–1.00)
GFR, Estimated: 24 mL/min — ABNORMAL LOW (ref >60)
Glucose, Bld: 135 mg/dL — ABNORMAL HIGH (ref 70–99)
Potassium: 3.5 mmol/L (ref 3.5–5.1)
Sodium: 134 mmol/L — ABNORMAL LOW (ref 135–145)

## 2023-11-29 LAB — CBC
HCT: 32.1 % — ABNORMAL LOW (ref 36.0–46.0)
Hemoglobin: 9.8 g/dL — ABNORMAL LOW (ref 12.0–15.0)
MCH: 26 pg (ref 26.0–34.0)
MCHC: 30.5 g/dL (ref 30.0–36.0)
MCV: 85.1 fL (ref 80.0–100.0)
Platelets: 237 K/uL (ref 150–400)
RBC: 3.77 MIL/uL — ABNORMAL LOW (ref 3.87–5.11)
RDW: 17.5 % — ABNORMAL HIGH (ref 11.5–15.5)
WBC: 5.9 K/uL (ref 4.0–10.5)
nRBC: 0 % (ref 0.0–0.2)

## 2023-11-29 LAB — MAGNESIUM: Magnesium: 1.9 mg/dL (ref 1.7–2.4)

## 2023-11-29 LAB — TSH: TSH: 1.314 u[IU]/mL (ref 0.350–4.500)

## 2023-11-29 LAB — MRSA NEXT GEN BY PCR, NASAL: MRSA by PCR Next Gen: NOT DETECTED

## 2023-11-29 MED ORDER — FUROSEMIDE 10 MG/ML IJ SOLN
40.0000 mg | Freq: Once | INTRAMUSCULAR | Status: AC
  Administered 2023-11-29: 40 mg via INTRAVENOUS
  Filled 2023-11-29: qty 4

## 2023-11-29 MED ORDER — ORAL CARE MOUTH RINSE
15.0000 mL | OROMUCOSAL | Status: DC
  Administered 2023-11-29 – 2023-12-02 (×12): 15 mL via OROMUCOSAL

## 2023-11-29 MED ORDER — ORAL CARE MOUTH RINSE: 15.0000 mL | OROMUCOSAL | Status: DC | PRN

## 2023-11-29 MED ORDER — PREDNISONE 20 MG PO TABS
40.0000 mg | ORAL_TABLET | Freq: Every day | ORAL | Status: AC
  Administered 2023-11-29 – 2023-12-02 (×4): 40 mg via ORAL
  Filled 2023-11-29 (×4): qty 2

## 2023-11-29 MED ORDER — INFLUENZA VAC SPLIT HIGH-DOSE 0.5 ML IM SUSY
0.5000 mL | PREFILLED_SYRINGE | Freq: Once | INTRAMUSCULAR | Status: AC
  Administered 2023-12-02: 0.5 mL via INTRAMUSCULAR
  Filled 2023-11-29 (×2): qty 0.5

## 2023-11-29 MED ORDER — FUROSEMIDE 10 MG/ML IJ SOLN
40.0000 mg | Freq: Every day | INTRAMUSCULAR | Status: DC
  Administered 2023-11-30 – 2023-12-01 (×2): 40 mg via INTRAVENOUS
  Filled 2023-11-29 (×2): qty 4

## 2023-11-29 NOTE — Progress Notes (Signed)
   11/29/23 0040  BiPAP/CPAP/SIPAP  BiPAP/CPAP/SIPAP Pt Type Adult  BiPAP/CPAP/SIPAP DREAMSTATIOND  Mask Type Full face mask  Mask Size Small  Respiratory Rate 14 breaths/min  IPAP 12 cmH20  EPAP 6 cmH2O  Flow Rate 4 lpm  Patient Home Machine No  Patient Home Mask No  Patient Home Tubing No  Device Plugged into RED Power Outlet Yes  BiPAP/CPAP /SiPAP Vitals  Pulse Rate (!) 58  Resp 14  SpO2 93 %  Bilateral Breath Sounds Diminished

## 2023-11-29 NOTE — Progress Notes (Signed)
 PROGRESS NOTE    Morgan Odonnell  FMW:[id number] DOB: Jun 28, 1950 DOA: 11/28/2023 PCP: Pcp, No    Brief Narrative:   Morgan Odonnell is a 73 y.o. female with medical history significant for morbid obesity, OSA, mild cognitive impairment, obesity hypoventilation syndrome, chronic hypoxemic and hypercarbic respiratory failure on 4 L oxygen  at home, BiPAP at night but does not use it, lymphedema, chronic diastolic HF, COPD, hypothyroidism, CKD3A, bedbound status who presented to the ED for evaluation of shortness of breath. Per spouse, patient has been getting rehab at [facility] after her most recent hospitalization.  Patient was found to be hypoxic at the facility with reported shortness of breath.  She was given a breathing treatment her pulse ox was change however she remained hypoxic to the 80s on her baseline 4 L [place] so EMS was called.  Patient found to be wheezing on arrival to the ED. Spouse reports he is unsure if patient is receiving her Lasix  at the facility.   Assessment and Plan: # Acute on chronic respiratory failure with hypoxia and hypercapnia - Presented with shortness of breath and hypoxia - Found to have increased O2 requirement from 4 L [place] to 8 L [place], currently weaned down to 6 L  - This is secondary to acute CHF exacerbation and COPD exacerbation - Continue supplemental O2, wean as able   possible COPD exacerbation - Presented with shortness of breath, wheezing and hypoxia - Pt with rales and decreased breath sounds on lung auscultation - CXR with no evidence of PNA; Neg covid, RSV and flu test - S/p IV solumedrol, DuoNebs in the ED - Start prednisone  40 mg daily - Continue home bronchodilator - As needed duonebs - Incentive spirometer, flutter valve   # Acute on chronic diastolic HF - Last TTE on 08/16/2023 shows EF 50-55%, moderately reduced RV SF and moderately dilated LA and RA - Pt presented with shortness of breath and hypoxia - Pt with clinical, laboratory  and radiological signs of CHF exacerbation - Acute CHF likely 2/2 under diuresing in the outpatient - Start IV lasix  40 mg daily - Strict I&O, daily weights - Maintain K+ > 4.0, Mag > 2.0 - Telemetry   # AKI on CKD 3A - Creatinine elevated to 2.25, from baseline of 1.2-1.3 - Likely secondary to acute CHF - IV diuresing as above -Daily labs   # Hypokalemia -replete   # Chronic lymphedema - Continue IV diuresing as above - Lymphedema clinic in the outpatient   # Hypothyroidism - Continue on Synthroid    # OSA # OHS - Continue BiPAP at night and while napping   # Mood disorder - Continue Celexa    # Morbid obesity Estimated body mass index is 46.53 kg/m as calculated from the following:   Height as of this encounter: 5' 2 (1.575 m).   Weight as of this encounter: 115.4 kg.    DVT prophylaxis: enoxaparin  (LOVENOX ) injection 30 mg Start: 11/29/23 1000    Code Status: Limited: Do not attempt resuscitation (DNR) -DNR-LIMITED -Do Not Intubate/DNI  Family Communication: At bedside  Disposition Plan:  Level of care: Progressive Status is: Inpatient     Consultants:    Subjective: Napping, thinks breathing may be better  Objective: Vitals:   11/29/23 0834 11/29/23 0836 11/29/23 0838 11/29/23 1106  BP:    (!) 101/50  Pulse:   62 (!) 52  Resp:    14  Temp:    98.3 F (36.8 C)  TempSrc:  Axillary  SpO2: 94% 93%  97%  Weight:      Height:        Intake/Output Summary (Last 24 hours) at 11/29/2023 1202 Last data filed at 11/29/2023 1132 Gross per 24 hour  Intake 120 ml  Output 1050 ml  Net -930 ml   Filed Weights   11/28/23 1938 11/28/23 2353 11/29/23 0623  Weight: 120.6 kg 116.1 kg 115.4 kg    Examination:   General: Appearance:    Severely obese female in no acute distress     Lungs:   Diminished, on nasal cannula  Heart:    Bradycardic.    MS:   All extremities are intact.    Neurologic: Will awaken, slow to respond to voice       Data  Reviewed: I have personally reviewed following labs and imaging studies  CBC: Recent Labs  Lab 11/28/23 1953 11/28/23 2239 11/29/23 0216  WBC 9.0  --  5.9  NEUTROABS 5.2  --   --   HGB 9.5* 10.5* 9.8*  HCT 31.0* 31.0* 32.1*  MCV 86.6  --  85.1  PLT 247  --  237   Basic Metabolic Panel: Recent Labs  Lab 11/28/23 1953 11/28/23 2239 11/29/23 0216  NA 134* 136 134*  K 3.4* 3.5 3.5  CL 97*  --  96*  CO2 28  --  27  GLUCOSE 81  --  135*  BUN 32*  --  30*  CREATININE 2.25*  --  2.12*  CALCIUM  8.6*  --  8.9  MG 1.8  --  1.9   GFR: Estimated Creatinine Clearance: 29.3 mL/min (A) (by C-G formula based on SCr of 2.12 mg/dL (H)). Liver Function Tests: Recent Labs  Lab 11/28/23 1953  AST 28  ALT 17  ALKPHOS 68  BILITOT 1.6*  PROT 7.2  ALBUMIN 2.3*   No results for input(s): LIPASE, AMYLASE in the last 168 hours. No results for input(s): AMMONIA in the last 168 hours. Coagulation Profile: No results for input(s): INR, PROTIME in the last 168 hours. Cardiac Enzymes: No results for input(s): CKTOTAL, CKMB, CKMBINDEX, TROPONINI in the last 168 hours. BNP (last 3 results) No results for input(s): PROBNP in the last 8760 hours. HbA1C: No results for input(s): HGBA1C in the last 72 hours. CBG: No results for input(s): GLUCAP in the last 168 hours. Lipid Profile: No results for input(s): CHOL, HDL, LDLCALC, TRIG, CHOLHDL, LDLDIRECT in the last 72 hours. Thyroid  Function Tests: Recent Labs    11/29/23 0216  TSH 1.314   Anemia Panel: No results for input(s): VITAMINB12, FOLATE, FERRITIN, TIBC, IRON, RETICCTPCT in the last 72 hours. Sepsis Labs: No results for input(s): PROCALCITON, LATICACIDVEN in the last 168 hours.  Recent Results (from the past 240 hours)  MRSA Next Gen by PCR, Nasal     Status: None   Collection Time: 11/28/23 11:47 PM   Specimen: Nasal Mucosa; Nasal Swab  Result Value Ref Range Status   MRSA  by PCR Next Gen NOT DETECTED NOT DETECTED Final    Comment: (NOTE) The GeneXpert MRSA Assay (FDA approved for NASAL specimens only), is one component of a comprehensive MRSA colonization surveillance program. It is not intended to diagnose MRSA infection nor to guide or monitor treatment for MRSA infections. Test performance is not FDA approved in patients less than 26 years old. Performed at [facility], [address], [place], [place] [address]          Radiology Studies:  DG Chest Portable 1 View Result Date: 11/28/2023 CLINICAL DATA:  Show shortness of breath EXAM: PORTABLE CHEST 1 VIEW COMPARISON:  10/30/2023 FINDINGS: Cardiomegaly, vascular congestion. No confluent opacities, effusions or edema. No acute bony abnormality. Dense mitral valve calcifications. IMPRESSION: Cardiomegaly, vascular congestion. Electronically Signed   By: Franky Crease M.D.   On: 11/28/2023 20:28        Scheduled Meds:  arformoterol   15 mcg Nebulization Q12H   aspirin  EC  81 mg Oral q AM   budesonide   0.5 mg Nebulization BID   citalopram   20 mg Oral Daily   diclofenac  Sodium  2 g Topical TID AC & HS   enoxaparin  (LOVENOX ) injection  30 mg Subcutaneous Q24H   feeding supplement  237 mL Oral q1800   Influenza vac split trivalent PF  0.5 mL Intramuscular Once   levothyroxine   200 mcg Oral Q0600   naloxegol  oxalate  25 mg Oral Daily   mouth rinse  15 mL Mouth Rinse 4 times per day   potassium chloride   20 mEq Oral BID   predniSONE   40 mg Oral Q breakfast   propranolol   10 mg Oral BID   umeclidinium bromide   1 puff Inhalation Daily   Continuous Infusions:   LOS: 1 day    Time spent: 45 minutes spent on chart review, discussion with nursing staff, consultants, updating family and interview/physical exam; more than 50% of that time was spent in counseling and/or coordination of care.    Harlene RAYMOND Bowl, DO [facility] Available via Epic secure chat 7am-7pm After these hours,  please refer to coverage provider listed on [email or web address] 11/29/2023, 12:02 PM

## 2023-11-30 DIAGNOSIS — Z515 Encounter for palliative care: Secondary | ICD-10-CM

## 2023-11-30 DIAGNOSIS — J9621 Acute and chronic respiratory failure with hypoxia: Secondary | ICD-10-CM | POA: Diagnosis not present

## 2023-11-30 DIAGNOSIS — J9622 Acute and chronic respiratory failure with hypercapnia: Secondary | ICD-10-CM | POA: Diagnosis not present

## 2023-11-30 DIAGNOSIS — Z7189 Other specified counseling: Secondary | ICD-10-CM

## 2023-11-30 NOTE — Consult Note (Signed)
 Consultation Note Date: 11/30/2023   Patient Name: Morgan Odonnell  DOB: 1950/08/22  MRN: [id number]  Age / Sex: 73 y.o., female  PCP: Pcp, No Referring Physician: Juvenal Harlene PENNER, DO  Reason for Consultation: Establishing goals of care  HPI/Patient Profile: 73 y.o. female  with past medical history of morbid obesity, OSA, mild cognitive impairment, obesity hypoventilation syndrome, chronic hypoxemic and hypercarbic respiratory failure on 4 L oxygen  at home, BiPAP at night but does not use it, lymphedema, chronic diastolic HF, COPD, hypothyroidism, CKD3A, bedbound status admitted on 11/28/2023 with shortness of breath.   Patient presents for her 5th admission in 6 months, most recently admitted less than 30 days ago. She is here with acute on chronic respiratory failure and acute on chronic CHF exacerbation with concern for COPD exacerbation as well.   PMT has been consulted to assist with goals of care conversation.  Clinical Assessment and Goals of Care:  I have reviewed medical records including EPIC notes, labs and imaging, discussed with RN, assessed the patient and then met at the bedside with patient's husband and son to discuss diagnosis prognosis, GOC, EOL wishes, disposition and options.  I introduced Palliative Medicine as specialized medical care for people living with serious illness. It focuses on providing relief from the symptoms and stress of a serious illness. The goal is to improve quality of life for both the patient and the family.  We discussed a brief life review of the patient and then focused on their current illness.   I attempted to elicit values and goals of care important to the patient.    Medical History Review and Understanding:  We discussed patient's acute illness in the context of their chronic comorbidities. Patient's family understand the severity of patient's illness.  Social History: Patient is married for over 50  years. Patient has 1 child from a previous relationship and they have 2 children together. She has been at [facility] for rehab but dislikes it very much.   Functional and Nutritional State: Patient has been in and out of rehabilitation facilities due to overall health decline and family understand that this is not getting better with likelihood of further decline.   Palliative Symptoms: Pain  Advance Directives: Reviewed documentation currently on file from 10/2023.  Patient's son [name] is primary HCPOA, followed by Annabella Masters.  Code Status: Concepts specific to code status, artifical feeding and hydration, and rehospitalization were considered and discussed.  DNR/DNI confirmed.  Patient does not want any further hospitalizations.  Discussion: Had detailed with patient's family regarding lack of meaningful improvement after the past several months of attempting rehab with recurrent complications and further decline.  Clarified the difference between hospice and palliative, providing reassurance that several of patient's medications could still be continued even while on hospice.  Priority at this time is to return home with hospice support.  They would like to have a bed and oxygen  delivered.  Patient's son is Scientific laboratory technician and notes that he will be driving almost 4 hours to return home this evening.  He would like for a call from hospice for referral tomorrow at the earliest.  We discussed options for patient's care while she is hospitalized, reviewing potential comfort focused care while coordinating hospice at home.  Patient's family would like to continue with the current care plan and optimize her is much as possible prior to discharge.  Spiritual care consult was offered.  Patient politely declined.   The difference between aggressive medical intervention and  comfort care was considered in light of the patient's goals of care. Hospice and Palliative Care services outpatient  were explained and offered.   Discussed the importance of continued conversation with family and the medical providers regarding overall plan of care and treatment options, ensuring decisions are within the context of the patient's values and GOCs.   Questions and concerns were addressed.  Hard Choices booklet left for review. The family was encouraged to call with questions or concerns.  PMT will continue to support holistically.    SUMMARY OF RECOMMENDATIONS   -Continue DNR/DNI -Continue current care plan, stabilize as much as possible -Ultimate goal is for discharge home with hospice support.  Patient's son is primary contact/HCPOA.  Primary attending, TOC and [facility] hospital liaison notified -Psychosocial and emotional support provided -PMT will continue to follow and support  Prognosis:  < 6 months  Discharge Planning: Home with Hospice      Primary Diagnoses: Present on Admission:  Acute on chronic respiratory failure with hypoxia and hypercapnia (HCC)  COPD exacerbation (HCC)  OSA treated with BiPAP  Acute renal failure superimposed on stage 3a chronic kidney disease (HCC)    Physical Exam Vitals and nursing note reviewed.  Constitutional:      General: She is not in acute distress.    Appearance: She is ill-appearing.  HENT:     Head: Normocephalic and atraumatic.  Cardiovascular:     Rate and Rhythm: Normal rate.  Pulmonary:     Effort: Pulmonary effort is normal.  Neurological:     Mental Status: She is confused.  Psychiatric:        Attention and Perception: She is inattentive.        Speech: Speech normal.        Behavior: Behavior normal.     Vital Signs: BP (!) 125/54 (BP Location: Right Arm)   Pulse 68   Temp 97.8 F (36.6 C) (Oral)   Resp 18   Ht 5' 2 (1.575 m)   Wt 115.7 kg   SpO2 91%   BMI 46.65 kg/m  Pain Scale: 0-10   Pain Score: 0-No pain   SpO2: SpO2: 91 % O2 Device:SpO2: 91 % O2 Flow Rate: .O2 Flow Rate (L/min): 4  L/min   Eugenie Harewood SHAUNNA Fell, PA-C  Palliative Medicine Team Team phone # [phone]  Thank you for allowing the Palliative Medicine Team to assist in the care of this patient. Please utilize secure chat with additional questions, if there is no response within 30 minutes please call the above phone number.  Palliative Medicine Team providers are available by phone from 7am to 7pm daily and can be reached through the team cell phone.  Should this patient require assistance outside of these hours, please call the patient's attending physician.   Billing based on MDM: High  Problems Addressed: One acute or chronic illness or injury that poses a threat to life or bodily function  Amount and/or Complexity of Data: Category 1:Review of prior external note(s) from each unique source, Review of the result(s) of each unique test, and Assessment requiring an independent historian(s), Category 2:Independent interpretation of a test performed by another physician/other qualified health care professional (not separately reported), and Category 3:Discussion of management or test interpretation with external physician/other qualified health care professional/appropriate source (not separately reported)  Risks: Decision not to resuscitate or to de-escalate care because of poor prognosis

## 2023-11-30 NOTE — Progress Notes (Signed)
 PROGRESS NOTE    Morgan Odonnell  FMW:[id number] DOB: 1951/01/01 DOA: 11/28/2023 PCP: Pcp, No    Brief Narrative:   Morgan Odonnell is a 74 y.o. female with medical history significant for morbid obesity, OSA, mild cognitive impairment, obesity hypoventilation syndrome, chronic hypoxemic and hypercarbic respiratory failure on 4 L oxygen  at home, BiPAP at night but does not use it, lymphedema, chronic diastolic HF, COPD, hypothyroidism, CKD3A, bedbound status who presented to the ED for evaluation of shortness of breath. Per spouse, patient has been getting rehab at [facility] after her most recent hospitalization.  Patient was found to be hypoxic at the facility with reported shortness of breath.  She was given a breathing treatment her pulse ox was change however she remained hypoxic to the 80s on her baseline 4 L [place] so EMS was called.  Patient found to be wheezing on arrival to the ED. Spouse reports he is unsure if patient is receiving her Lasix  at the facility.   Family wanted to meet with palliative care again and transition to hospice at home.  Assessment and Plan: # Acute on chronic respiratory failure with hypoxia and hypercapnia - Presented with shortness of breath and hypoxia - Found to have increased O2 requirement from 4 L [place] to 8 L [place], currently weaned down to 6 L [place] - This is secondary to acute CHF exacerbation and COPD exacerbation - Continue supplemental O2, wean as able -Palliative care to see patient as she continues to refuse to wear CPAP at night and family would like to take home with hospice   possible COPD exacerbation - Presented with shortness of breath, wheezing and hypoxia - Pt with rales and decreased breath sounds on lung auscultation - CXR with no evidence of PNA; Neg covid, RSV and flu test - S/p IV solumedrol, DuoNebs in the ED - Start prednisone  40 mg daily - Continue home bronchodilator - As needed duonebs - Incentive spirometer, flutter valve   #  Acute on chronic diastolic HF - Last TTE on 08/16/2023 shows EF 50-55%, moderately reduced RV SF and moderately dilated LA and RA - Pt presented with shortness of breath and hypoxia - Pt with clinical, laboratory and radiological signs of CHF exacerbation - Acute CHF likely 2/2 under diuresing in the outpatient - Start IV lasix  40 mg daily - Strict I&O, daily weights - Maintain K+ > 4.0, Mag > 2.0 - Telemetry   # AKI on CKD 3A - Creatinine elevated to 2.25, from baseline of 1.2-1.3 - Likely secondary to acute CHF - IV diuresing as above -Daily labs   # Hypokalemia -replete   # Chronic lymphedema - Continue IV diuresing as above - Lymphedema clinic in the outpatient   # Hypothyroidism - Continue on Synthroid    # OSA # OHS - Continue BiPAP at night and while napping   # Mood disorder - Continue Celexa    # Morbid obesity Estimated body mass index is 46.65 kg/m as calculated from the following:   Height as of this encounter: 5' 2 (1.575 m).   Weight as of this encounter: 115.7 kg.    DVT prophylaxis: enoxaparin  (LOVENOX ) injection 30 mg Start: 11/29/23 1000    Code Status: Limited: Do not attempt resuscitation (DNR) -DNR-LIMITED -Do Not Intubate/DNI  Family Communication: At bedside  Disposition Plan:  Level of care: Progressive Status is: Inpatient     Consultants:  Palliative care  Subjective: Would not wear CPAP last night  Objective: Vitals:   11/30/23 9277  11/30/23 0857 11/30/23 0858 11/30/23 1058  BP: (!) 116/55   (!) 125/54  Pulse: 67 68    Resp: 17 20  18   Temp: 98.6 F (37 C)   97.8 F (36.6 C)  TempSrc: Oral   Oral  SpO2:  92% 91% 91%  Weight:      Height:        Intake/Output Summary (Last 24 hours) at 11/30/2023 1215 Last data filed at 11/30/2023 1058 Gross per 24 hour  Intake 240 ml  Output 1400 ml  Net -1160 ml   Filed Weights   11/28/23 2353 11/29/23 0623 11/30/23 0500  Weight: 116.1 kg 115.4 kg 115.7 kg     Examination:   General: Appearance:    Severely obese female in no acute distress     Lungs:   Diminished, on nasal cannula  Heart:    Normal heart rate.    MS:   All extremities are intact.    Neurologic: Will awaken, slow to respond to voice       Data Reviewed: I have personally reviewed following labs and imaging studies  CBC: Recent Labs  Lab 11/28/23 1953 11/28/23 2239 11/29/23 0216  WBC 9.0  --  5.9  NEUTROABS 5.2  --   --   HGB 9.5* 10.5* 9.8*  HCT 31.0* 31.0* 32.1*  MCV 86.6  --  85.1  PLT 247  --  237   Basic Metabolic Panel: Recent Labs  Lab 11/28/23 1953 11/28/23 2239 11/29/23 0216  NA 134* 136 134*  K 3.4* 3.5 3.5  CL 97*  --  96*  CO2 28  --  27  GLUCOSE 81  --  135*  BUN 32*  --  30*  CREATININE 2.25*  --  2.12*  CALCIUM  8.6*  --  8.9  MG 1.8  --  1.9   GFR: Estimated Creatinine Clearance: 29.3 mL/min (A) (by C-G formula based on SCr of 2.12 mg/dL (H)). Liver Function Tests: Recent Labs  Lab 11/28/23 1953  AST 28  ALT 17  ALKPHOS 68  BILITOT 1.6*  PROT 7.2  ALBUMIN 2.3*   No results for input(s): LIPASE, AMYLASE in the last 168 hours. No results for input(s): AMMONIA in the last 168 hours. Coagulation Profile: No results for input(s): INR, PROTIME in the last 168 hours. Cardiac Enzymes: No results for input(s): CKTOTAL, CKMB, CKMBINDEX, TROPONINI in the last 168 hours. BNP (last 3 results) No results for input(s): PROBNP in the last 8760 hours. HbA1C: No results for input(s): HGBA1C in the last 72 hours. CBG: No results for input(s): GLUCAP in the last 168 hours. Lipid Profile: No results for input(s): CHOL, HDL, LDLCALC, TRIG, CHOLHDL, LDLDIRECT in the last 72 hours. Thyroid  Function Tests: Recent Labs    11/29/23 0216  TSH 1.314   Anemia Panel: No results for input(s): VITAMINB12, FOLATE, FERRITIN, TIBC, IRON, RETICCTPCT in the last 72 hours. Sepsis Labs: No results  for input(s): PROCALCITON, LATICACIDVEN in the last 168 hours.  Recent Results (from the past 240 hours)  MRSA Next Gen by PCR, Nasal     Status: None   Collection Time: 11/28/23 11:47 PM   Specimen: Nasal Mucosa; Nasal Swab  Result Value Ref Range Status   MRSA by PCR Next Gen NOT DETECTED NOT DETECTED Final    Comment: (NOTE) The GeneXpert MRSA Assay (FDA approved for NASAL specimens only), is one component of a comprehensive MRSA colonization surveillance program. It is not intended to diagnose MRSA infection nor  to guide or monitor treatment for MRSA infections. Test performance is not FDA approved in patients less than 32 years old. Performed at [facility], [address] [place], [place] [address]          Radiology Studies: DG Chest Portable 1 View Result Date: 11/28/2023 CLINICAL DATA:  Show shortness of breath EXAM: PORTABLE CHEST 1 VIEW COMPARISON:  10/30/2023 FINDINGS: Cardiomegaly, vascular congestion. No confluent opacities, effusions or edema. No acute bony abnormality. Dense mitral valve calcifications. IMPRESSION: Cardiomegaly, vascular congestion. Electronically Signed   By: Franky Crease M.D.   On: 11/28/2023 20:28        Scheduled Meds:  arformoterol   15 mcg Nebulization Q12H   aspirin  EC  81 mg Oral q AM   budesonide   0.5 mg Nebulization BID   citalopram   20 mg Oral Daily   diclofenac  Sodium  2 g Topical TID AC & HS   enoxaparin  (LOVENOX ) injection  30 mg Subcutaneous Q24H   feeding supplement  237 mL Oral q1800   furosemide   40 mg Intravenous Daily   Influenza vac split trivalent PF  0.5 mL Intramuscular Once   levothyroxine   200 mcg Oral Q0600   naloxegol  oxalate  25 mg Oral Daily   mouth rinse  15 mL Mouth Rinse 4 times per day   potassium chloride   20 mEq Oral BID   predniSONE   40 mg Oral Q breakfast   propranolol   10 mg Oral BID   umeclidinium bromide   1 puff Inhalation Daily   Continuous Infusions:   LOS: 2 days    Time  spent: 45 minutes spent on chart review, discussion with nursing staff, consultants, updating family and interview/physical exam; more than 50% of that time was spent in counseling and/or coordination of care.    Harlene RAYMOND Bowl, DO [facility] Available via Epic secure chat 7am-7pm After these hours, please refer to coverage provider listed on [email or web address] 11/30/2023, 12:15 PM

## 2023-12-01 DIAGNOSIS — J9621 Acute and chronic respiratory failure with hypoxia: Secondary | ICD-10-CM | POA: Diagnosis not present

## 2023-12-01 DIAGNOSIS — J9622 Acute and chronic respiratory failure with hypercapnia: Secondary | ICD-10-CM | POA: Diagnosis not present

## 2023-12-01 MED ORDER — ENSURE PLUS HIGH PROTEIN PO LIQD
237.0000 mL | Freq: Two times a day (BID) | ORAL | Status: DC
  Administered 2023-12-01 – 2023-12-02 (×2): 237 mL via ORAL

## 2023-12-01 MED ORDER — FUROSEMIDE 40 MG PO TABS
40.0000 mg | ORAL_TABLET | Freq: Two times a day (BID) | ORAL | Status: DC
  Administered 2023-12-01 – 2023-12-02 (×3): 40 mg via ORAL
  Filled 2023-12-01 (×4): qty 1

## 2023-12-01 NOTE — Progress Notes (Signed)
 [facility] Liaison Note  Received request from Transitions of Care Manager Kristie for hospice services at home after discharge. Spoke with patient's son [name] to initiate education related to hospice philosophy, services, and team approach to care.  He verbalized understanding of information given. Per discussion, the plan is for discharge home by [facility] once patient is stable.   DME needs discussed. Son requests the following equipment for delivery: hospital bed, oxygen  and hoyer lift. The address has been verified and is correct in the chart. Pts husband is the family contact to arrange time of equipment delivery.  Please send signed and completed DNR home with patient/family. Please provide prescriptions at discharge as needed to ensure ongoing symptom management.   [facility] information and contact numbers given to [employer]. Above information shared with Roxie, Transitions of Care Manager. Please call with any questions or concerns.  Thank you for the opportunity to participate in this patient's care.   Amy Darien BSN, RN [facility] Liaison [phone]

## 2023-12-01 NOTE — Plan of Care (Signed)
  Problem: Education: Goal: Knowledge of General Education information will improve Description: Including pain rating scale, medication(s)/side effects and non-pharmacologic comfort measures Outcome: Progressing   Problem: Clinical Measurements: Goal: Will remain free from infection Outcome: Progressing   Problem: Nutrition: Goal: Adequate nutrition will be maintained Outcome: Progressing   Problem: Safety: Goal: Ability to remain free from injury will improve Outcome: Progressing

## 2023-12-01 NOTE — Care Management Important Message (Signed)
 Important Message  Patient Details  Name: Morgan Odonnell MRN: [id number] Date of Birth: 12-06-1950   Important Message Given:  Yes - Medicare IM     Claretta Deed 12/01/2023, 1:41 PM

## 2023-12-01 NOTE — Progress Notes (Signed)
 Daily Progress Note   Patient Name: Morgan Odonnell       Date: 12/01/2023 DOB: [birth year]  Age: 73 y.o. MRN#: [id number] Attending Physician: Juvenal Harlene PENNER, DO Primary Care Physician: Pcp, No Admit Date: 11/28/2023  Reason for Consultation/Follow-up: Establishing goals of care  Subjective: Medical records reviewed including progress notes, labs and imaging. Patient assessed at the bedside. She is pleasantly confused. Her husband is at bedside, trying to rest. He shares that he has been in contact with his son about hospice equipment delivery. He is somewhat concerned about how things will get into the home and where they will go, but goals are clear and consistent for discharge home with hospice after medically appropriate treatments have been completed. No needs at this time.  Questions and concerns addressed. PMT will continue to support holistically.   Length of Stay: 3   Physical Exam Vitals and nursing note reviewed.  Constitutional:      General: She is not in acute distress.    Appearance: She is ill-appearing.  HENT:     Head: Normocephalic and atraumatic.  Cardiovascular:     Rate and Rhythm: Normal rate.  Pulmonary:     Effort: Pulmonary effort is normal.  Neurological:     Mental Status: She is confused.  Psychiatric:        Mood and Affect: Mood normal.        Behavior: Behavior normal.        Cognition and Memory: Cognition is impaired.             Vital Signs: BP (!) 123/42   Pulse (!) 57   Temp 97.8 F (36.6 C) (Axillary)   Resp 12   Ht 5' 2 (1.575 m)   Wt 116.1 kg   SpO2 95%   BMI 46.81 kg/m  SpO2: SpO2: 95 % O2 Device: O2 Device: CPAP O2 Flow Rate: O2 Flow Rate (L/min): 5 L/min      Palliative Assessment/Data: 30%   Palliative Care Assessment & Plan   Patient Profile: 73 y.o. female   with past medical history of morbid obesity, OSA, mild cognitive impairment, obesity hypoventilation syndrome, chronic hypoxemic and hypercarbic respiratory failure on 4 L oxygen  at home, BiPAP at night but does not use it, lymphedema, chronic diastolic HF, COPD, hypothyroidism, CKD3A, bedbound status admitted on 11/28/2023 with shortness of breath.    Patient presents for her 5th admission in 6 months, most recently admitted less than 30 days ago. She is here with acute on chronic respiratory failure and acute on chronic CHF exacerbation with concern for COPD exacerbation as well.    PMT has been consulted to assist with goals of care conversation.  Assessment: Acute on chronic respiratory failure with hypoxia and hypercapnia Acute on chronic diastolic HF AKI on CKD 3A  Recommendations/Plan: Continue DNR/DNI Continue current care plan Goal is to discharge home with hospice when medically ready PMT remains available as needed   Prognosis:  < 6 months  Discharge Planning: Home with Hospice   Care plan was discussed with Patient, patient's husband         Carolann Brazell SHAUNNA Fell, [facility]  Palliative Medicine Team Team phone # [phone]  Thank you for allowing the Palliative Medicine Team to assist in the care of this patient. Please utilize secure chat with additional questions, if there is no response within 30 minutes please call the above phone number.  Palliative Medicine Team providers are available by phone from 7am to 7pm daily and can be reached through the team cell phone.  Should this patient require assistance outside of these hours, please call the patient's attending physician.    Time Total: 25  Visit consisted of counseling and education dealing with the complex and emotionally intense issues of symptom management and palliative care in the setting of serious and potentially life-threatening illness. Greater than 50% of this time was spent counseling and coordinating  care related to the above assessment and plan.  Personally spent 25 minutes in patient care including extensive chart review (labs, imaging, progress/consult notes, vital signs), medically appropraite exam, discussed with treatment team, education to patient, family, and staff, documenting clinical information, medication review and management, coordination of care, and available advanced directive documents.

## 2023-12-01 NOTE — TOC Initial Note (Signed)
 Transition of Care [facility]) - Initial/Assessment Note    Patient Details  Name: Morgan Odonnell MRN: [id number] Date of Birth: 1951/01/10  [facility]) CM/SW Contact:    Roxie KANDICE Stain, RN Phone Number: 12/01/2023, 9:12 AM  Clinical Narrative:                 [facility] consult for home hospice and to call son for choice.Offered  Son , [name], choice for home hospice. Son chose [facility]. Amy with [facility] accepted referral. Amy will call [place].Patient will need PTAR transportation home.  ICM (Inpatient Care Management) will continue to follow for needs.  Expected Discharge Plan: Home w Hospice Care Barriers to Discharge: Continued Medical Work up   Patient Goals and CMS Choice Patient states their goals for this hospitalization and ongoing recovery are:: Son wants patient to go home with hospice CMS Medicare.gov Compare Post Acute Care list provided to:: Patient Represenative (must comment) Choice offered to / list presented to : Adult Children      Expected Discharge Plan and Services In-house Referral: Hospice / Palliative Care Discharge Planning Services: CM Consult Post Acute Care Choice: Hospice Living arrangements for the past 2 months: Single Family Home                             HH Agency: [facility] Date [facility] Agency Contacted: 12/01/23 Time HH Agency Contacted: 0911 Representative spoke with at [facility] Agency: Amy  Prior Living Arrangements/Services Living arrangements for the past 2 months: Single Family Home Lives with:: Spouse Patient language and need for interpreter reviewed:: Yes Do you feel safe going back to the place where you live?: Yes      Need for Family Participation in Patient Care: Yes (Comment) Care giver support system in place?: Yes (comment) Current home services: DME (bipap, WC, 02) Criminal Activity/Legal Involvement Pertinent to Current Situation/Hospitalization: No - Comment as  needed  Activities of Daily Living   ADL Screening (condition at time of admission) Independently performs ADLs?: No Does the patient have a NEW difficulty with bathing/dressing/toileting/self-feeding that is expected to last >3 days?: No Does the patient have a NEW difficulty with getting in/out of bed, walking, or climbing stairs that is expected to last >3 days?: No Does the patient have a NEW difficulty with communication that is expected to last >3 days?: No Is the patient deaf or have difficulty hearing?: No Does the patient have difficulty seeing, even when wearing glasses/contacts?: No Does the patient have difficulty concentrating, remembering, or making decisions?: Yes  Permission Sought/Granted Permission sought to share information with : Case Manager, Magazine features editor Permission granted to share information with : Yes, Verbal Permission Granted     Permission granted to share info w AGENCY: [facility]        Emotional Assessment       Orientation: : Oriented to Self, Oriented to Place, Oriented to Situation Alcohol / Substance Use: Not Applicable Psych Involvement: No (comment)  Admission diagnosis:  Hypoxia [R09.02] COPD exacerbation (HCC) [J44.1] AKI (acute kidney injury) (HCC) [N17.9] Acute on chronic respiratory failure with hypoxia and hypercapnia (HCC) [G03.78, J96.22] Patient Active Problem List   Diagnosis Date Noted   Hypoxia 11/29/2023   Pressure injury of skin 10/31/2023   CAP (community acquired pneumonia) 10/30/2023   Acute metabolic encephalopathy 10/16/2023   Hypokalemia 10/16/2023   Acute on chronic heart failure with preserved ejection fraction (HFpEF) (HCC)  09/19/2023   Hyponatremia 08/29/2023   Dysphagia 08/29/2023   Palliative care encounter 08/29/2023   Counseling and coordination of care 08/29/2023   Goals of care, counseling/discussion 08/29/2023   Heart failure (HCC) 08/29/2023   Need for emotional support 08/29/2023    Multifocal pneumonia 08/26/2023   Normocytic anemia 08/16/2023   Precordial pain 02/21/2021   Exertional dyspnea 02/21/2021   Acute on chronic respiratory failure with hypoxia and hypercapnia (HCC) 06/27/2017   Hyperkalemia 06/27/2017   Acute renal failure superimposed on stage 3a chronic kidney disease (HCC) 06/27/2017   Restrictive lung disease 12/13/2015   Obesity hypoventilation syndrome (HCC) 05/10/2015   Acute and chronic respiratory failure with hypoxia (HCC) 05/10/2015   Acute on chronic diastolic CHF (congestive heart failure) (HCC) 03/03/2015   OSA treated with BiPAP 03/02/2015   Morbid obesity with BMI of 45.0-49.9, adult (HCC) 10/18/2013   Chronic acquired lymphedema 01/26/2013   Chronic diastolic CHF (congestive heart failure) (HCC) 01/04/2013   Chronic cough    COPD exacerbation (HCC)    Snoring    Low back pain    Hypertension    PCP:  Pcp, No Pharmacy:   [facility] [address] [place] [address] Phone: [phone] Fax: [phone]  [facility] [place] [facility], [place] - [address] [place] [address] Phone: [phone] Fax: [phone]     Social Drivers of Health (SDOH) Social History: SDOH Screenings   Food Insecurity: No Food Insecurity (11/29/2023)  Housing: Low Risk  (11/29/2023)  Transportation Needs: Unmet Transportation Needs (11/29/2023)  Utilities: Not At Risk (11/29/2023)  Depression (PHQ2-9): Medium Risk (01/13/2019)  Social Connections: Moderately Isolated (11/29/2023)  Tobacco Use: Medium Risk (11/28/2023)   SDOH Interventions:     Readmission Risk Interventions    11/12/2023    1:43 PM 09/19/2023    1:52 PM 08/17/2023    9:31 AM  Readmission Risk Prevention Plan  Transportation Screening Complete Complete Complete  PCP or Specialist Appt within 5-7 Days   Complete  PCP or Specialist Appt within 3-5 Days  Complete   Home Care Screening   Complete   Medication Review (RN CM)   Complete  HRI or Home Care Consult  Complete   Social Work Consult for Recovery Care Planning/Counseling  Complete   Palliative Care Screening  Complete   Medication Review Oceanographer) Complete Complete   Palliative Care Screening Complete    Skilled Nursing Facility Complete

## 2023-12-01 NOTE — Progress Notes (Signed)
 PROGRESS NOTE    Morgan Odonnell  FMW:[id number] DOB: Jan 05, 1951 DOA: 11/28/2023 PCP: Pcp, No    Brief Narrative:   Morgan Odonnell is a 73 y.o. female with medical history significant for morbid obesity, OSA, mild cognitive impairment, obesity hypoventilation syndrome, chronic hypoxemic and hypercarbic respiratory failure on 4 L oxygen  at home, BiPAP at night but does not use it, lymphedema, chronic diastolic HF, COPD, hypothyroidism, CKD3A, bedbound status who presented to the ED for evaluation of shortness of breath. Per spouse, patient has been getting rehab at [facility] after her most recent hospitalization.  Patient was found to be hypoxic at the facility with reported shortness of breath.  She was given a breathing treatment her pulse ox was change however she remained hypoxic to the 80s on her baseline 4 L [place] so EMS was called.  Patient found to be wheezing on arrival to the ED. Spouse reports he is unsure if patient is receiving her Lasix  at the facility.   Family met with palliative care again and transition to hospice at home.  Assessment and Plan: # Acute on chronic respiratory failure with hypoxia and hypercapnia - Presented with shortness of breath and hypoxia - Found to have increased O2 requirement from 4 L [place] to 8 L [place], currently weaned down to 6 L [place] - This is secondary to acute CHF exacerbation and COPD exacerbation - Continue supplemental O2, wean as able to home 4 -to 5 L -Palliative care saw patient as she continues to refuse to wear CPAP at night and family would like to take home with hospice   possible COPD exacerbation - Presented with shortness of breath, wheezing and hypoxia - Pt with rales and decreased breath sounds on lung auscultation - CXR with no evidence of PNA; Neg covid, RSV and flu test - S/p IV solumedrol, DuoNebs in the ED - Start prednisone  40 mg daily - Continue home bronchodilator - As needed duonebs - Incentive spirometer, flutter valve    # Acute on chronic diastolic HF - Last TTE on 08/16/2023 shows EF 50-55%, moderately reduced RV SF and moderately dilated LA and RA - Pt presented with shortness of breath and hypoxia - Pt with clinical, laboratory and radiological signs of CHF exacerbation - Acute CHF likely 2/2 under diuresing in the outpatient - Start IV lasix  40 mg daily - Strict I&O, daily weights - Maintain K+ > 4.0, Mag > 2.0 - Telemetry   # AKI on CKD 3A - Creatinine elevated to 2.25, from baseline of 1.2-1.3 - Likely secondary to acute CHF - Changed to p.o. diuretics anticipation of discharge home with hospice -Daily labs   # Hypokalemia -replete   # Chronic lymphedema - Changed to as needed diuretics - Lymphedema clinic in the outpatient   # Hypothyroidism - Continue on Synthroid    # OSA # OHS - Continue BiPAP at night and while napping   # Mood disorder - Continue Celexa    # Morbid obesity Estimated body mass index is 46.81 kg/m as calculated from the following:   Height as of this encounter: 5' 2 (1.575 m).   Weight as of this encounter: 116.1 kg.    DVT prophylaxis: enoxaparin  (LOVENOX ) injection 30 mg Start: 11/29/23 1000    Code Status: Limited: Do not attempt resuscitation (DNR) -DNR-LIMITED -Do Not Intubate/DNI  Family Communication: At bedside  Disposition Plan:  Level of care: Progressive Status is: Inpatient     Consultants:  Palliative care  Subjective: Wore CPAP last night  Refuse labs  Objective: Vitals:   12/01/23 0310 12/01/23 0353 12/01/23 0650 12/01/23 0831  BP: (!) 122/57  130/61 (!) 123/42  Pulse: (!) 58  (!) 54 (!) 57  Resp: 18  12   Temp: 97.7 F (36.5 C)  97.8 F (36.6 C)   TempSrc: Axillary  Axillary   SpO2: 93%  95%   Weight:  116.1 kg    Height:        Intake/Output Summary (Last 24 hours) at 12/01/2023 1037 Last data filed at 12/01/2023 0700 Gross per 24 hour  Intake 120 ml  Output 2850 ml  Net -2730 ml   Filed Weights   11/29/23 0623  11/30/23 0500 12/01/23 0353  Weight: 115.4 kg 115.7 kg 116.1 kg    Examination:   General: Appearance:    Severely obese female in no acute distress     Lungs:   Diminished, on nasal cannula- 5L  Heart:    Bradycardic.    MS:   All extremities are intact.    Neurologic: Faster to respond       Data Reviewed: I have personally reviewed following labs and imaging studies  CBC: Recent Labs  Lab 11/28/23 1953 11/28/23 2239 11/29/23 0216  WBC 9.0  --  5.9  NEUTROABS 5.2  --   --   HGB 9.5* 10.5* 9.8*  HCT 31.0* 31.0* 32.1*  MCV 86.6  --  85.1  PLT 247  --  237   Basic Metabolic Panel: Recent Labs  Lab 11/28/23 1953 11/28/23 2239 11/29/23 0216  NA 134* 136 134*  K 3.4* 3.5 3.5  CL 97*  --  96*  CO2 28  --  27  GLUCOSE 81  --  135*  BUN 32*  --  30*  CREATININE 2.25*  --  2.12*  CALCIUM  8.6*  --  8.9  MG 1.8  --  1.9   GFR: Estimated Creatinine Clearance: 29.4 mL/min (A) (by C-G formula based on SCr of 2.12 mg/dL (H)). Liver Function Tests: Recent Labs  Lab 11/28/23 1953  AST 28  ALT 17  ALKPHOS 68  BILITOT 1.6*  PROT 7.2  ALBUMIN 2.3*   No results for input(s): LIPASE, AMYLASE in the last 168 hours. No results for input(s): AMMONIA in the last 168 hours. Coagulation Profile: No results for input(s): INR, PROTIME in the last 168 hours. Cardiac Enzymes: No results for input(s): CKTOTAL, CKMB, CKMBINDEX, TROPONINI in the last 168 hours. BNP (last 3 results) No results for input(s): PROBNP in the last 8760 hours. HbA1C: No results for input(s): HGBA1C in the last 72 hours. CBG: No results for input(s): GLUCAP in the last 168 hours. Lipid Profile: No results for input(s): CHOL, HDL, LDLCALC, TRIG, CHOLHDL, LDLDIRECT in the last 72 hours. Thyroid  Function Tests: Recent Labs    11/29/23 0216  TSH 1.314   Anemia Panel: No results for input(s): VITAMINB12, FOLATE, FERRITIN, TIBC, IRON, RETICCTPCT in  the last 72 hours. Sepsis Labs: No results for input(s): PROCALCITON, LATICACIDVEN in the last 168 hours.  Recent Results (from the past 240 hours)  MRSA Next Gen by PCR, Nasal     Status: None   Collection Time: 11/28/23 11:47 PM   Specimen: Nasal Mucosa; Nasal Swab  Result Value Ref Range Status   MRSA by PCR Next Gen NOT DETECTED NOT DETECTED Final    Comment: (NOTE) The GeneXpert MRSA Assay (FDA approved for NASAL specimens only), is one component of a comprehensive MRSA colonization surveillance program. It  is not intended to diagnose MRSA infection nor to guide or monitor treatment for MRSA infections. Test performance is not FDA approved in patients less than 39 years old. Performed at [facility], [address], [place], [place] [address]          Radiology Studies: No results found.       Scheduled Meds:  arformoterol   15 mcg Nebulization Q12H   aspirin  EC  81 mg Oral q AM   budesonide   0.5 mg Nebulization BID   citalopram   20 mg Oral Daily   diclofenac  Sodium  2 g Topical TID AC & HS   enoxaparin  (LOVENOX ) injection  30 mg Subcutaneous Q24H   feeding supplement  237 mL Oral BID BM   furosemide   40 mg Oral BID   Influenza vac split trivalent PF  0.5 mL Intramuscular Once   levothyroxine   200 mcg Oral Q0600   naloxegol  oxalate  25 mg Oral Daily   mouth rinse  15 mL Mouth Rinse 4 times per day   potassium chloride   20 mEq Oral BID   predniSONE   40 mg Oral Q breakfast   propranolol   10 mg Oral BID   umeclidinium bromide   1 puff Inhalation Daily   Continuous Infusions:   LOS: 3 days    Time spent: 45 minutes spent on chart review, discussion with nursing staff, consultants, updating family and interview/physical exam; more than 50% of that time was spent in counseling and/or coordination of care.    Harlene RAYMOND Bowl, DO [facility] Available via Epic secure chat 7am-7pm After these hours, please refer to coverage provider listed on  [email or web address] 12/01/2023, 10:37 AM

## 2023-12-02 DIAGNOSIS — J9622 Acute and chronic respiratory failure with hypercapnia: Secondary | ICD-10-CM | POA: Diagnosis not present

## 2023-12-02 DIAGNOSIS — J9621 Acute and chronic respiratory failure with hypoxia: Secondary | ICD-10-CM | POA: Diagnosis not present

## 2023-12-02 LAB — BASIC METABOLIC PANEL WITH GFR
Anion gap: 9 (ref 5–15)
BUN: 39 mg/dL — ABNORMAL HIGH (ref 8–23)
CO2: 32 mmol/L (ref 22–32)
Calcium: 9.1 mg/dL (ref 8.9–10.3)
Chloride: 93 mmol/L — ABNORMAL LOW (ref 98–111)
Creatinine, Ser: 1.52 mg/dL — ABNORMAL HIGH (ref 0.44–1.00)
GFR, Estimated: 36 mL/min — ABNORMAL LOW (ref >60)
Glucose, Bld: 98 mg/dL (ref 70–99)
Potassium: 3.7 mmol/L (ref 3.5–5.1)
Sodium: 134 mmol/L — ABNORMAL LOW (ref 135–145)

## 2023-12-02 MED ORDER — FUROSEMIDE 40 MG PO TABS: 40.0000 mg | ORAL_TABLET | Freq: Two times a day (BID) | ORAL | 0 refills | Status: AC

## 2023-12-02 MED ORDER — PROPRANOLOL HCL 10 MG PO TABS: 10.0000 mg | ORAL_TABLET | Freq: Two times a day (BID) | ORAL | 0 refills | Status: AC

## 2023-12-02 MED ORDER — ENOXAPARIN SODIUM 40 MG/0.4ML IJ SOSY
40.0000 mg | PREFILLED_SYRINGE | INTRAMUSCULAR | Status: DC
Start: 2023-12-03 — End: 2023-12-03

## 2023-12-02 NOTE — Plan of Care (Signed)

## 2023-12-02 NOTE — TOC Transition Note (Signed)
 Transition of Care [facility]) - Discharge Note   Patient Details  Name: Morgan Odonnell MRN: [id number] Date of Birth: 1951/02/03  [facility]) CM/SW Contact:  Roxie KANDICE Stain, RN Phone Number: 12/02/2023, 4:04 PM   Clinical Narrative:    Patient stable for discharge home with hospice. Misty with [facility] will arranged admission nurse. DME will be delivery  to home.  PTAR arranged for after DME delivery    Final next level of care: Home w Hospice Care Barriers to Discharge: Barriers Resolved   Patient Goals and CMS Choice Patient states their goals for this hospitalization and ongoing recovery are:: Son wants patient to go home with hospice CMS Medicare.gov Compare Post Acute Care list provided to:: Patient Represenative (must comment) Choice offered to / list presented to : Adult Children      Discharge Placement             home          Discharge Plan and Services Additional resources added to the After Visit Summary for   In-house Referral: Hospice / Palliative Care Discharge Planning Services: CM Consult Post Acute Care Choice: Hospice                      [facility] Agency: [facility] Date [facility] Agency Contacted: 12/01/23 Time HH Agency Contacted: 0911 Representative spoke with at [facility] Agency: Amy  Social Drivers of Health (SDOH) Interventions SDOH Screenings   Food Insecurity: No Food Insecurity (11/29/2023)  Housing: Low Risk  (11/29/2023)  Transportation Needs: Unmet Transportation Needs (11/29/2023)  Utilities: Not At Risk (11/29/2023)  Depression (PHQ2-9): Medium Risk (01/13/2019)  Social Connections: Moderately Isolated (11/29/2023)  Tobacco Use: Medium Risk (11/28/2023)     Readmission Risk Interventions    11/12/2023    1:43 PM 09/19/2023    1:52 PM 08/17/2023    9:31 AM  Readmission Risk Prevention Plan  Transportation Screening Complete Complete Complete  PCP or Specialist Appt within 5-7 Days   Complete   PCP or Specialist Appt within 3-5 Days  Complete   Home Care Screening   Complete  Medication Review (RN CM)   Complete  HRI or Home Care Consult  Complete   Social Work Consult for Recovery Care Planning/Counseling  Complete   Palliative Care Screening  Complete   Medication Review Oceanographer) Complete Complete   Palliative Care Screening Complete    Skilled Nursing Facility Complete

## 2023-12-02 NOTE — Progress Notes (Signed)
 PROGRESS NOTE    Morgan Odonnell  FMW:[id number] DOB: 1950-06-21 DOA: 11/28/2023 PCP: Pcp, No    Brief Narrative:   Morgan Odonnell is a 73 y.o. female with medical history significant for morbid obesity, OSA, mild cognitive impairment, obesity hypoventilation syndrome, chronic hypoxemic and hypercarbic respiratory failure on 4 L oxygen  at home, BiPAP at night but does not use it, lymphedema, chronic diastolic HF, COPD, hypothyroidism, CKD3A, bedbound status who presented to the ED for evaluation of shortness of breath. Per spouse, patient has been getting rehab at [facility] after her most recent hospitalization.  Patient was found to be hypoxic at the facility with reported shortness of breath.  She was given a breathing treatment her pulse ox was change however she remained hypoxic to the 80s on her baseline 4 L [place] so EMS was called.  Patient found to be wheezing on arrival to the ED. Spouse reports he is unsure if patient is receiving her Lasix  at the facility.   Family met with palliative care again and transition to hospice at home once medically stable.   Assessment and Plan: # Acute on chronic respiratory failure with hypoxia and hypercapnia - Presented with shortness of breath and hypoxia - Found to have increased O2 requirement from 4 L [place] to 8 L [place], currently weaned down to 6 L [place] - This is secondary to acute CHF exacerbation and COPD exacerbation - Continue supplemental O2, wean as able to home 4 -to 5 L -Palliative care saw patient as she continues to refuse to wear CPAP at night and family would like to take home with hospice   possible COPD exacerbation - Presented with shortness of breath, wheezing and hypoxia - Pt with rales and decreased breath sounds on lung auscultation - CXR with no evidence of PNA; Neg covid, RSV and flu test - S/p IV solumedrol, DuoNebs in the ED - Started prednisone  40 mg daily - Continue home bronchodilator - As needed duonebs - Incentive  spirometer, flutter valve   # Acute on chronic diastolic HF - Last TTE on 08/16/2023 shows EF 50-55%, moderately reduced RV SF and moderately dilated LA and RA - Pt presented with shortness of breath and hypoxia - Pt with clinical, laboratory and radiological signs of CHF exacerbation - Acute CHF likely 2/2 under diuresing in the outpatient - Started IV lasix  40 mg daily with good response, changed to oral Lasix  at a higher dose - Strict I&O, daily weights-weights do not appear to be accurate - Maintain K+ > 4.0, Mag > 2.0 - Telemetry   # AKI on CKD 3A - Creatinine elevated to 2.25, from baseline of 1.2-1.3 - Likely secondary to acute CHF - Changed to p.o. diuretics anticipation of discharge home with hospice    # Hypokalemia -replete   # Chronic lymphedema - Changed to as needed diuretics - Lymphedema clinic in the outpatient   # Hypothyroidism - Continue on Synthroid    # OSA # OHS - Continue BiPAP at night and while napping   # Mood disorder - Continue Celexa    # Morbid obesity Estimated body mass index is 47.1 kg/m as calculated from the following:   Height as of this encounter: 5' 2 (1.575 m).   Weight as of this encounter: 116.8 kg.    DVT prophylaxis: enoxaparin  (LOVENOX ) injection 30 mg Start: 11/29/23 1000    Code Status: Limited: Do not attempt resuscitation (DNR) -DNR-LIMITED -Do Not Intubate/DNI  Family Communication: At bedside  Disposition Plan:  Level  of care: Telemetry Cardiac Status is: Inpatient     Consultants:  Palliative care  Subjective: Did not wear CPAP much last night Husband thinks equipment from hospice to be delivered today  Objective: Vitals:   12/01/23 2302 12/02/23 0414 12/02/23 0646 12/02/23 0757  BP: (!) 108/50  (!) 126/59   Pulse: 69  63 63  Resp: 16  18 16   Temp: 98.3 F (36.8 C) 97.6 F (36.4 C) 97.9 F (36.6 C)   TempSrc: Oral Oral Oral   SpO2: 93%  91% 93%  Weight:  116.8 kg    Height:        Intake/Output  Summary (Last 24 hours) at 12/02/2023 1244 Last data filed at 12/02/2023 9185 Gross per 24 hour  Intake 298 ml  Output 1450 ml  Net -1152 ml   Filed Weights   11/30/23 0500 12/01/23 0353 12/02/23 0414  Weight: 115.7 kg 116.1 kg 116.8 kg    Examination:   General: Appearance:    Severely obese female in no acute distress     Lungs:   Diminished, on nasal cannula- 5L  Heart:    Normal heart rate.    MS:   All extremities are intact.    Neurologic: Faster to respond       Data Reviewed: I have personally reviewed following labs and imaging studies  CBC: Recent Labs  Lab 11/28/23 1953 11/28/23 2239 11/29/23 0216  WBC 9.0  --  5.9  NEUTROABS 5.2  --   --   HGB 9.5* 10.5* 9.8*  HCT 31.0* 31.0* 32.1*  MCV 86.6  --  85.1  PLT 247  --  237   Basic Metabolic Panel: Recent Labs  Lab 11/28/23 1953 11/28/23 2239 11/29/23 0216 12/02/23 0827  NA 134* 136 134* 134*  K 3.4* 3.5 3.5 3.7  CL 97*  --  96* 93*  CO2 28  --  27 32  GLUCOSE 81  --  135* 98  BUN 32*  --  30* 39*  CREATININE 2.25*  --  2.12* 1.52*  CALCIUM  8.6*  --  8.9 9.1  MG 1.8  --  1.9  --    GFR: Estimated Creatinine Clearance: 41.2 mL/min (A) (by C-G formula based on SCr of 1.52 mg/dL (H)). Liver Function Tests: Recent Labs  Lab 11/28/23 1953  AST 28  ALT 17  ALKPHOS 68  BILITOT 1.6*  PROT 7.2  ALBUMIN 2.3*   No results for input(s): LIPASE, AMYLASE in the last 168 hours. No results for input(s): AMMONIA in the last 168 hours. Coagulation Profile: No results for input(s): INR, PROTIME in the last 168 hours. Cardiac Enzymes: No results for input(s): CKTOTAL, CKMB, CKMBINDEX, TROPONINI in the last 168 hours. BNP (last 3 results) No results for input(s): PROBNP in the last 8760 hours. HbA1C: No results for input(s): HGBA1C in the last 72 hours. CBG: No results for input(s): GLUCAP in the last 168 hours. Lipid Profile: No results for input(s): CHOL, HDL,  LDLCALC, TRIG, CHOLHDL, LDLDIRECT in the last 72 hours. Thyroid  Function Tests: No results for input(s): TSH, T4TOTAL, FREET4, T3FREE, THYROIDAB in the last 72 hours.  Anemia Panel: No results for input(s): VITAMINB12, FOLATE, FERRITIN, TIBC, IRON, RETICCTPCT in the last 72 hours. Sepsis Labs: No results for input(s): PROCALCITON, LATICACIDVEN in the last 168 hours.  Recent Results (from the past 240 hours)  MRSA Next Gen by PCR, Nasal     Status: None   Collection Time: 11/28/23 11:47 PM  Specimen: Nasal Mucosa; Nasal Swab  Result Value Ref Range Status   MRSA by PCR Next Gen NOT DETECTED NOT DETECTED Final    Comment: (NOTE) The GeneXpert MRSA Assay (FDA approved for NASAL specimens only), is one component of a comprehensive MRSA colonization surveillance program. It is not intended to diagnose MRSA infection nor to guide or monitor treatment for MRSA infections. Test performance is not FDA approved in patients less than 93 years old. Performed at [facility], [address], [place], [place] [address]          Radiology Studies: No results found.       Scheduled Meds:  arformoterol   15 mcg Nebulization Q12H   aspirin  EC  81 mg Oral q AM   budesonide   0.5 mg Nebulization BID   citalopram   20 mg Oral Daily   diclofenac  Sodium  2 g Topical TID AC & HS   enoxaparin  (LOVENOX ) injection  30 mg Subcutaneous Q24H   feeding supplement  237 mL Oral BID BM   furosemide   40 mg Oral BID   Influenza vac split trivalent PF  0.5 mL Intramuscular Once   levothyroxine   200 mcg Oral Q0600   naloxegol  oxalate  25 mg Oral Daily   mouth rinse  15 mL Mouth Rinse 4 times per day   potassium chloride   20 mEq Oral BID   propranolol   10 mg Oral BID   umeclidinium bromide   1 puff Inhalation Daily   Continuous Infusions:   LOS: 4 days    Time spent: 45 minutes spent on chart review, discussion with nursing staff, consultants, updating family  and interview/physical exam; more than 50% of that time was spent in counseling and/or coordination of care.    Morgan RAYMOND Bowl, DO [facility] Available via Epic secure chat 7am-7pm After these hours, please refer to coverage provider listed on [email or web address] 12/02/2023, 12:44 PM

## 2023-12-02 NOTE — Plan of Care (Signed)
  Problem: Education: Goal: Knowledge of General Education information will improve Description: Including pain rating scale, medication(s)/side effects and non-pharmacologic comfort measures Outcome: Progressing   Problem: Clinical Measurements: Goal: Will remain free from infection Outcome: Progressing Goal: Respiratory complications will improve Outcome: Progressing   Problem: Elimination: Goal: Will not experience complications related to urinary retention Outcome: Progressing   Problem: Safety: Goal: Ability to remain free from injury will improve Outcome: Progressing

## 2023-12-02 NOTE — Discharge Summary (Signed)
 Physician Discharge Summary  [name] FMW:[id number] DOB: [birth year] DOA: 11/28/2023  PCP: Fredd[place], No  Admit date: 11/28/2023 Discharge date: 12/02/2023  Admitted From:  Discharge disposition: home with hospice   Recommendations for Outpatient Follow-Up:   Hospice to adjust meds as needed   Discharge Diagnosis:   Principal Problem:   Acute on chronic respiratory failure with hypoxia and hypercapnia (HCC) Active Problems:   COPD exacerbation (HCC)   Chronic acquired lymphedema   Morbid obesity with BMI of 45.0-49.9, adult (HCC)   OSA treated with BiPAP   Acute renal failure superimposed on stage 3a chronic kidney disease (HCC)   Hypoxia    Discharge Condition: terminal  Diet recommendation: DYS 2  Wound care: None.  Code statusDNR   History of Present Illness:    Morgan Odonnell is a 73 y.o. female with medical history significant for morbid obesity, OSA, mild cognitive impairment, obesity hypoventilation syndrome, chronic hypoxemic and hypercarbic respiratory failure on 4 L oxygen  at home, BiPAP at night but does not use it, lymphedema, chronic diastolic HF, COPD, hypothyroidism, CKD3A, bedbound status who presented to the ED for evaluation of shortness of breath. Per spouse, patient has been getting rehab at [facility] after her most recent hospitalization.  Patient was found to be hypoxic at the facility with reported shortness of breath.  She was given a breathing treatment her pulse ox was change however she remained hypoxic to the 80s on her baseline 4 L [place] so EMS was called.  Patient found to be wheezing on arrival to the ED. Spouse reports he is unsure if patient is receiving her Lasix  at the facility.    Family met with palliative care again and transition to hospice at home once medically stable.   Hospital Course by Problem:   # Acute on chronic respiratory failure with hypoxia and hypercapnia - Presented with shortness of breath and  hypoxia - Found to have increased O2 requirement from 4 L [place] to 8 L [place], currently weaned down to 6 L [place] - This is secondary to acute CHF exacerbation and COPD exacerbation - Continue supplemental O2, wean as able to home 4 -to 5 L -Palliative care saw patient as she continues to refuse to wear CPAP at night and family would like to take home with hospice   possible COPD exacerbation - Presented with shortness of breath, wheezing and hypoxia - Pt with rales and decreased breath sounds on lung auscultation - CXR with no evidence of PNA; Neg covid, RSV and flu test - S/p IV solumedrol, DuoNebs in the ED - Started prednisone  40 mg daily - Continue home bronchodilator - As needed duonebs - Incentive spirometer, flutter valve   # Acute on chronic diastolic HF - Last TTE on 08/16/2023 shows EF 50-55%, moderately reduced RV SF and moderately dilated LA and RA - Pt presented with shortness of breath and hypoxia - Pt with clinical, laboratory and radiological signs of CHF exacerbation - Acute CHF likely 2/2 under diuresing in the outpatient - Started IV lasix  40 mg daily with good response, changed to oral Lasix  at a higher dose   # AKI on CKD 3A - Creatinine elevated to 2.25, from baseline of 1.2-1.3 - Likely secondary to acute CHF - Changed to p.o. diuretics anticipation of discharge home with hospice     # Hypokalemia -replete   # Chronic lymphedema    # Hypothyroidism - Continue on Synthroid    # OSA # OHS -  Continue BiPAP at night and while napping   # Mood disorder - Continue Celexa    # Morbid obesity Estimated body mass index is 47.1 kg/m as calculated from the following:   Height as of this encounter: 5' 2 (1.575 m).   Weight as of this encounter: 116.8 kg.     Medical Consultants:      Discharge Exam:   Vitals:   12/02/23 0757 12/02/23 1631  BP:  (!) 118/50  Pulse: 63 61  Resp: 16 15  Temp:  98.4 F (36.9 C)  SpO2: 93% 98%   Vitals:   12/02/23 0414  12/02/23 0646 12/02/23 0757 12/02/23 1631  BP:  (!) 126/59  (!) 118/50  Pulse:  63 63 61  Resp:  18 16 15   Temp: 97.6 F (36.4 C) 97.9 F (36.6 C)  98.4 F (36.9 C)  TempSrc: Oral Oral  Oral  SpO2:  91% 93% 98%  Weight: 116.8 kg     Height:        General exam: Appears calm and comfortable.   The results of significant diagnostics from this hospitalization (including imaging, microbiology, ancillary and laboratory) are listed below for reference.     Procedures and Diagnostic Studies:   DG Chest Portable 1 View Result Date: 11/28/2023 CLINICAL DATA:  Show shortness of breath EXAM: PORTABLE CHEST 1 VIEW COMPARISON:  10/30/2023 FINDINGS: Cardiomegaly, vascular congestion. No confluent opacities, effusions or edema. No acute bony abnormality. Dense mitral valve calcifications. IMPRESSION: Cardiomegaly, vascular congestion. Electronically Signed   By: Franky Crease M.D.   On: 11/28/2023 20:28     Labs:   Basic Metabolic Panel: Recent Labs  Lab 11/28/23 1953 11/28/23 2239 11/29/23 0216 12/02/23 0827  NA 134* 136 134* 134*  K 3.4* 3.5 3.5 3.7  CL 97*  --  96* 93*  CO2 28  --  27 32  GLUCOSE 81  --  135* 98  BUN 32*  --  30* 39*  CREATININE 2.25*  --  2.12* 1.52*  CALCIUM  8.6*  --  8.9 9.1  MG 1.8  --  1.9  --    GFR Estimated Creatinine Clearance: 41.2 mL/min (A) (by C-G formula based on SCr of 1.52 mg/dL (H)). Liver Function Tests: Recent Labs  Lab 11/28/23 1953  AST 28  ALT 17  ALKPHOS 68  BILITOT 1.6*  PROT 7.2  ALBUMIN 2.3*   No results for input(s): LIPASE, AMYLASE in the last 168 hours. No results for input(s): AMMONIA in the last 168 hours. Coagulation profile No results for input(s): INR, PROTIME in the last 168 hours.  CBC: Recent Labs  Lab 11/28/23 1953 11/28/23 2239 11/29/23 0216  WBC 9.0  --  5.9  NEUTROABS 5.2  --   --   HGB 9.5* 10.5* 9.8*  HCT 31.0* 31.0* 32.1*  MCV 86.6  --  85.1  PLT 247  --  237   Cardiac Enzymes: No  results for input(s): CKTOTAL, CKMB, CKMBINDEX, TROPONINI in the last 168 hours. BNP: Invalid input(s): POCBNP CBG: No results for input(s): GLUCAP in the last 168 hours. D-Dimer No results for input(s): DDIMER in the last 72 hours. Hgb A1c No results for input(s): HGBA1C in the last 72 hours. Lipid Profile No results for input(s): CHOL, HDL, LDLCALC, TRIG, CHOLHDL, LDLDIRECT in the last 72 hours. Thyroid  function studies No results for input(s): TSH, T4TOTAL, T3FREE, THYROIDAB in the last 72 hours.  Invalid input(s): FREET3 Anemia work up No results for input(s): VITAMINB12, FOLATE, FERRITIN,  TIBC, IRON, RETICCTPCT in the last 72 hours. Microbiology Recent Results (from the past 240 hours)  MRSA Next Gen by PCR, Nasal     Status: None   Collection Time: 11/28/23 11:47 PM   Specimen: Nasal Mucosa; Nasal Swab  Result Value Ref Range Status   MRSA by PCR Next Gen NOT DETECTED NOT DETECTED Final    Comment: (NOTE) The GeneXpert MRSA Assay (FDA approved for NASAL specimens only), is one component of a comprehensive MRSA colonization surveillance program. It is not intended to diagnose MRSA infection nor to guide or monitor treatment for MRSA infections. Test performance is not FDA approved in patients less than 24 years old. Performed at [facility], [address], [place], [place] [address]      Discharge Instructions:   Discharge Instructions     Discharge instructions   Complete by: As directed    Dys 2 diet   Increase activity slowly   Complete by: As directed    No wound care   Complete by: As directed       Allergies as of 12/02/2023       Reactions   Gabapentin Other (See Comments)   slept for 2 days   Naloxone  Other (See Comments)   Allergy not listed on MAR    Novocain [procaine] Other (See Comments)   intolerance        Medication List     STOP taking these medications    baclofen  10  MG tablet Commonly known as: LIORESAL        TAKE these medications    AeroChamber Z-Stat Plus [place]br Misc Use as directed   Aspirin  81 MG Caps Take 81 mg by mouth in the morning.   budesonide  0.5 MG/2ML nebulizer solution Commonly known as: PULMICORT  Take 0.5 mg by nebulization 2 (two) times daily.   citalopram  20 MG tablet Commonly known as: CELEXA  Take 1 tablet (20 mg total) by mouth daily.   diclofenac  sodium 1 % Gel Commonly known as: VOLTAREN  Apply 1 application topically 4 (four) times daily. Applied to knees, shoulders, wrists, and ankles   formoterol  20 MCG/2ML nebulizer solution Commonly known as: PERFOROMIST  Take 20 mcg by nebulization 2 (two) times daily.   furosemide  40 MG tablet Commonly known as: LASIX  Take 1 tablet (40 mg total) by mouth 2 (two) times daily. What changed: when to take this   ipratropium-albuterol  0.5-2.5 (3) MG/3ML Soln Commonly known as: DUONEB Take 3 mLs by nebulization every 6 (six) hours as needed (wheezing with dyspnea).   levothyroxine  100 MCG tablet Commonly known as: SYNTHROID  Take 200 mcg by mouth daily before breakfast.   Movantik  25 MG Tabs tablet Generic drug: naloxegol  oxalate Take 25 mg by mouth daily.   Nutritional Shake Plus Protein Liqd Take 120 mLs by mouth daily at 6 PM. What changed: Another medication with the same name was removed. Continue taking this medication, and follow the directions you see here.   OXYGEN  Inhale 5 L into the lungs continuous.   potassium chloride  20 MEQ packet Commonly known as: KLOR-CON  Take 20 mEq by mouth 2 (two) times daily.   propranolol  10 MG tablet Commonly known as: INDERAL  Take 1 tablet (10 mg total) by mouth 2 (two) times daily.   spironolactone  25 MG tablet Commonly known as: ALDACTONE  Take 1 tablet (25 mg total) by mouth daily.   tiotropium 18 MCG inhalation capsule Commonly known as: Spiriva  HandiHaler Place 1 capsule (18 mcg total) into inhaler and inhale  daily. What changed: when to take this        Follow-up Information     [facility] Follow up.   Specialty: Hospice and Palliative Medicine Why: Home hospice agency Contact information: [address] [place]  [address] [phone]                 Time coordinating discharge: 45  Signed:  Harlene RAYMOND Bowl DO  [facility] 12/02/2023, 6:58 PM

## 2023-12-12 ENCOUNTER — Other Ambulatory Visit (HOSPITAL_COMMUNITY): Payer: Self-pay
